# Patient Record
Sex: Female | Born: 1935 | Race: White | Hispanic: No | Smoking: Never smoker
Health system: Southern US, Community
[De-identification: ages and names within clinical notes are randomized; demographics above are authoritative.]

## PROBLEM LIST (undated history)

## (undated) ENCOUNTER — Ambulatory Visit: Admission: EM | Discharge status: Absent without leave | Payer: Medicare Other

## (undated) DIAGNOSIS — R490 Dysphonia: Secondary | ICD-10-CM

## (undated) DIAGNOSIS — Z9889 Other specified postprocedural states: Secondary | ICD-10-CM

## (undated) DIAGNOSIS — H9190 Unspecified hearing loss, unspecified ear: Secondary | ICD-10-CM

## (undated) DIAGNOSIS — I493 Ventricular premature depolarization: Secondary | ICD-10-CM

## (undated) DIAGNOSIS — K5792 Diverticulitis of intestine, part unspecified, without perforation or abscess without bleeding: Secondary | ICD-10-CM

## (undated) DIAGNOSIS — R112 Nausea with vomiting, unspecified: Secondary | ICD-10-CM

## (undated) DIAGNOSIS — I272 Pulmonary hypertension, unspecified: Secondary | ICD-10-CM

## (undated) DIAGNOSIS — G459 Transient cerebral ischemic attack, unspecified: Secondary | ICD-10-CM

## (undated) DIAGNOSIS — I10 Essential (primary) hypertension: Secondary | ICD-10-CM

## (undated) DIAGNOSIS — K219 Gastro-esophageal reflux disease without esophagitis: Secondary | ICD-10-CM

## (undated) DIAGNOSIS — K635 Polyp of colon: Secondary | ICD-10-CM

## (undated) DIAGNOSIS — C4490 Unspecified malignant neoplasm of skin, unspecified: Secondary | ICD-10-CM

## (undated) DIAGNOSIS — R7303 Prediabetes: Secondary | ICD-10-CM

## (undated) DIAGNOSIS — I499 Cardiac arrhythmia, unspecified: Secondary | ICD-10-CM

## (undated) DIAGNOSIS — I8393 Asymptomatic varicose veins of bilateral lower extremities: Secondary | ICD-10-CM

## (undated) DIAGNOSIS — N200 Calculus of kidney: Secondary | ICD-10-CM

## (undated) DIAGNOSIS — N390 Urinary tract infection, site not specified: Secondary | ICD-10-CM

## (undated) DIAGNOSIS — F419 Anxiety disorder, unspecified: Secondary | ICD-10-CM

## (undated) DIAGNOSIS — E119 Type 2 diabetes mellitus without complications: Secondary | ICD-10-CM

## (undated) DIAGNOSIS — E215 Disorder of parathyroid gland, unspecified: Secondary | ICD-10-CM

## (undated) DIAGNOSIS — E785 Hyperlipidemia, unspecified: Secondary | ICD-10-CM

## (undated) DIAGNOSIS — T753XXA Motion sickness, initial encounter: Secondary | ICD-10-CM

## (undated) DIAGNOSIS — I509 Heart failure, unspecified: Secondary | ICD-10-CM

## (undated) DIAGNOSIS — I639 Cerebral infarction, unspecified: Secondary | ICD-10-CM

## (undated) DIAGNOSIS — Z974 Presence of external hearing-aid: Secondary | ICD-10-CM

## (undated) HISTORY — DX: Calculus of kidney: N20.0

## (undated) HISTORY — DX: Hyperlipidemia, unspecified: E78.5

## (undated) HISTORY — DX: Disorder of parathyroid gland, unspecified: E21.5

## (undated) HISTORY — DX: Polyp of colon: K63.5

## (undated) HISTORY — PX: APPENDECTOMY: SHX54

## (undated) HISTORY — PX: ABDOMINAL HYSTERECTOMY: SHX81

## (undated) HISTORY — DX: Anxiety disorder, unspecified: F41.9

## (undated) HISTORY — DX: Unspecified hearing loss, unspecified ear: H91.90

## (undated) HISTORY — DX: Pulmonary hypertension, unspecified: I27.20

## (undated) HISTORY — DX: Cardiac arrhythmia, unspecified: I49.9

## (undated) HISTORY — DX: Unspecified malignant neoplasm of skin, unspecified: C44.90

## (undated) HISTORY — DX: Diverticulitis of intestine, part unspecified, without perforation or abscess without bleeding: K57.92

## (undated) HISTORY — PX: LITHOTRIPSY: SUR834

## (undated) HISTORY — DX: Urinary tract infection, site not specified: N39.0

## (undated) HISTORY — DX: Ventricular premature depolarization: I49.3

## (undated) HISTORY — PX: VEIN LIGATION AND STRIPPING: SHX2653

---
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
## 1982-06-19 HISTORY — PX: TOTAL ABDOMINAL HYSTERECTOMY W/ BILATERAL SALPINGOOPHORECTOMY: SHX83

## 1999-09-13 ENCOUNTER — Encounter: Payer: Self-pay | Admitting: Family Medicine

## 1999-09-13 ENCOUNTER — Encounter: Admission: RE | Admit: 1999-09-13 | Discharge: 1999-09-13 | Payer: Self-pay | Admitting: Family Medicine

## 2000-06-19 HISTORY — PX: ROTATOR CUFF REPAIR: SHX139

## 2000-06-19 HISTORY — PX: CHOLECYSTECTOMY: SHX55

## 2002-06-19 HISTORY — PX: PARATHYROIDECTOMY: SHX19

## 2003-12-04 ENCOUNTER — Other Ambulatory Visit: Payer: Self-pay

## 2004-05-27 ENCOUNTER — Ambulatory Visit: Payer: Self-pay | Admitting: Unknown Physician Specialty

## 2005-05-03 ENCOUNTER — Encounter: Payer: Self-pay | Admitting: Cardiovascular Disease

## 2005-06-05 ENCOUNTER — Ambulatory Visit: Payer: Self-pay | Admitting: Family Medicine

## 2006-02-20 ENCOUNTER — Inpatient Hospital Stay (HOSPITAL_COMMUNITY): Admission: AD | Admit: 2006-02-20 | Discharge: 2006-02-22 | Payer: Self-pay | Admitting: *Deleted

## 2006-02-21 ENCOUNTER — Encounter: Payer: Self-pay | Admitting: Cardiovascular Disease

## 2006-06-07 ENCOUNTER — Ambulatory Visit: Payer: Self-pay | Admitting: Internal Medicine

## 2007-06-11 ENCOUNTER — Ambulatory Visit: Payer: Self-pay | Admitting: Internal Medicine

## 2008-06-16 ENCOUNTER — Ambulatory Visit: Payer: Self-pay | Admitting: Internal Medicine

## 2008-09-04 ENCOUNTER — Encounter: Payer: Self-pay | Admitting: Cardiovascular Disease

## 2009-06-17 ENCOUNTER — Ambulatory Visit: Payer: Self-pay | Admitting: Internal Medicine

## 2009-07-26 ENCOUNTER — Encounter: Payer: Self-pay | Admitting: Cardiovascular Disease

## 2009-08-12 ENCOUNTER — Ambulatory Visit: Payer: Self-pay | Admitting: Cardiovascular Disease

## 2009-08-12 LAB — CONVERTED CEMR LAB: POC INR: 2.6

## 2009-08-13 DIAGNOSIS — I4891 Unspecified atrial fibrillation: Secondary | ICD-10-CM | POA: Insufficient documentation

## 2009-08-13 DIAGNOSIS — I4949 Other premature depolarization: Secondary | ICD-10-CM

## 2009-08-31 ENCOUNTER — Ambulatory Visit: Payer: Self-pay | Admitting: Cardiovascular Disease

## 2009-08-31 ENCOUNTER — Encounter: Payer: Self-pay | Admitting: Cardiovascular Disease

## 2009-08-31 LAB — CONVERTED CEMR LAB: POC INR: 2.5

## 2009-09-07 ENCOUNTER — Encounter (INDEPENDENT_AMBULATORY_CARE_PROVIDER_SITE_OTHER): Payer: Self-pay | Admitting: *Deleted

## 2009-09-07 LAB — CONVERTED CEMR LAB
ALT: 19 units/L
AST: 20 units/L
Albumin: 4.1 g/dL
Alkaline Phosphatase: 47 units/L
Basophils Relative: 0.05 %
Bilirubin, Direct: 0.2 mg/dL
Cholesterol: 159 mg/dL
Direct LDL: 67.7 mg/dL
Eosinophils Relative: 0.1 %
HCT: 44.6 %
Hemoglobin: 15.1 g/dL
Hgb A1c MFr Bld: 6.1 %
LDL Cholesterol: 74.3 mg/dL
Lymphocytes, automated: 1.53 %
MCV: 90.3 fL
Monocytes Relative: 0.36 %
Neutrophils Relative %: 1.71 %
Platelets: 172 10*3/uL
RBC: 4.94 M/uL
RDW: 14.1 %
Total Bilirubin: 0.9 mg/dL
Total Protein: 6.1 g/dL
Triglyceride fasting, serum: 85 mg/dL
WBC: 3.8 10*3/uL

## 2009-09-10 ENCOUNTER — Telehealth: Payer: Self-pay | Admitting: Cardiovascular Disease

## 2009-09-21 ENCOUNTER — Encounter: Payer: Self-pay | Admitting: Cardiovascular Disease

## 2009-10-01 ENCOUNTER — Encounter: Payer: Self-pay | Admitting: Cardiovascular Disease

## 2009-10-05 ENCOUNTER — Ambulatory Visit: Payer: Self-pay | Admitting: Internal Medicine

## 2009-10-05 LAB — CONVERTED CEMR LAB: POC INR: 2.1

## 2009-10-22 ENCOUNTER — Encounter (INDEPENDENT_AMBULATORY_CARE_PROVIDER_SITE_OTHER): Payer: Self-pay | Admitting: *Deleted

## 2009-11-02 ENCOUNTER — Ambulatory Visit: Payer: Self-pay | Admitting: Cardiovascular Disease

## 2009-11-02 LAB — CONVERTED CEMR LAB: POC INR: 2.4

## 2009-11-30 ENCOUNTER — Ambulatory Visit: Payer: Self-pay | Admitting: Cardiovascular Disease

## 2009-12-28 ENCOUNTER — Ambulatory Visit: Payer: Self-pay | Admitting: Cardiovascular Disease

## 2009-12-29 ENCOUNTER — Encounter: Payer: Self-pay | Admitting: Cardiovascular Disease

## 2010-01-11 ENCOUNTER — Encounter: Payer: Self-pay | Admitting: Cardiovascular Disease

## 2010-01-25 ENCOUNTER — Ambulatory Visit: Payer: Self-pay | Admitting: Cardiovascular Disease

## 2010-01-25 LAB — CONVERTED CEMR LAB: POC INR: 3

## 2010-02-17 ENCOUNTER — Telehealth: Payer: Self-pay | Admitting: Internal Medicine

## 2010-02-23 ENCOUNTER — Ambulatory Visit: Payer: Self-pay | Admitting: Cardiovascular Disease

## 2010-02-23 LAB — CONVERTED CEMR LAB: POC INR: 3

## 2010-03-23 ENCOUNTER — Ambulatory Visit: Payer: Self-pay | Admitting: Cardiovascular Disease

## 2010-04-13 ENCOUNTER — Ambulatory Visit: Payer: Self-pay | Admitting: Cardiovascular Disease

## 2010-05-11 ENCOUNTER — Ambulatory Visit: Payer: Self-pay | Admitting: Cardiovascular Disease

## 2010-05-26 ENCOUNTER — Encounter: Payer: Self-pay | Admitting: Cardiovascular Disease

## 2010-06-03 ENCOUNTER — Encounter: Payer: Self-pay | Admitting: Cardiovascular Disease

## 2010-06-03 ENCOUNTER — Ambulatory Visit: Payer: Self-pay | Admitting: Cardiovascular Disease

## 2010-06-03 DIAGNOSIS — E785 Hyperlipidemia, unspecified: Secondary | ICD-10-CM | POA: Insufficient documentation

## 2010-06-08 ENCOUNTER — Ambulatory Visit: Payer: Self-pay

## 2010-06-22 ENCOUNTER — Ambulatory Visit: Payer: Self-pay | Admitting: Internal Medicine

## 2010-06-27 ENCOUNTER — Encounter: Payer: Self-pay | Admitting: Cardiovascular Disease

## 2010-06-27 ENCOUNTER — Telehealth: Payer: Self-pay | Admitting: Cardiovascular Disease

## 2010-06-27 ENCOUNTER — Ambulatory Visit
Admission: RE | Admit: 2010-06-27 | Discharge: 2010-06-27 | Payer: Self-pay | Source: Home / Self Care | Attending: Cardiovascular Disease | Admitting: Cardiovascular Disease

## 2010-07-04 ENCOUNTER — Ambulatory Visit
Admission: RE | Admit: 2010-07-04 | Discharge: 2010-07-04 | Payer: Self-pay | Source: Home / Self Care | Attending: Cardiovascular Disease | Admitting: Cardiovascular Disease

## 2010-07-04 LAB — CONVERTED CEMR LAB: POC INR: 2

## 2010-07-13 ENCOUNTER — Ambulatory Visit: Admission: RE | Admit: 2010-07-13 | Discharge: 2010-07-13 | Payer: Self-pay | Source: Home / Self Care

## 2010-07-13 LAB — CONVERTED CEMR LAB: POC INR: 2.8

## 2010-07-17 LAB — CONVERTED CEMR LAB: POC INR: 2.2

## 2010-07-19 NOTE — Assessment & Plan Note (Signed)
Summary: NP6/AMD   Visit Type:  New patient Referring Provider:  Mariah Milling Primary Provider:  Einar Crow, MD  CC:  anxiety attacks in closed in places noticed since started metoprolol! no edema or chest pains...  History of Present Illness: Linda [name] is a 77 year old woman with past medical history of paroxysmal atrial fibrillation, on warfarin, normal coronary arteries in September 2007, echocardiogram in 2006 showing moderate asymmetric LVH with no outflow tract obstruction with normal systolic function, occasional breakthrough itch or fibrillation who presents for routine followup. She was last seen by myself at [facility] July 02, 2009.  Overall Linda [name] states that she is doing well. She has good energy, goes to the gym on a periodic basis. She has handled the lower dose diltiazem 180 mg daily and metoprolol 25 mg b.i.d. with no problems. She does feel a little bit short of breath at times and wonders if it could be to medicines. She denies any palpitations to suggest itch or fibrillation. No chest pain.  Preventive Screening-Counseling & Management  Alcohol-Tobacco     Alcohol drinks/day: 0     Smoking Status: never  Caffeine-Diet-Exercise     Caffeine use/day: no     Does Patient Exercise: yes  Current Problems (verified): 1)  Premature Ventricular Contractions  (ICD-427.69) 2)  Atrial Fibrillation  (ICD-427.31)  Current Medications (verified): 1)  Coumadin 5 Mg Tabs (Warfarin Sodium) .... As Directed By Coumadin Clinic 2)  Diltiazem Hcl Er Beads 180 Mg Xr24h-Cap (Diltiazem Hcl Er Beads) .... Take One Capsule By Mouth Daily 3)  Simvastatin 20 Mg Tabs (Simvastatin) .... Take One Tablet By Mouth Daily At Bedtime 4)  Vitamin D 400 Unit  Tabs (Cholecalciferol) .... Once Daily 5)  Metoprolol Tartrate 25 Mg Tabs (Metoprolol Tartrate) .... Take One Tablet By Mouth Twice A Day and As Needed  Allergies (verified): 1)  ! Asa 2)  ! Pcn 3)  !  Sulfa  Past History:  Past Surgical History: Abdominal Hysterectomy-Total Tonsillectomy Gall bladder removed Parathyroid removed Veins strep in the 80's  Family History: Mother: deceased 30: stroke Father: deceased 68; pneumona Brother deceased due to accident 2 brothers: living 40: heart problems                   living 43: healthy  Social History: Alcohol drinks/day:  0 Caffeine use/day:  no  Review of Systems  The patient denies fever, weight loss, weight gain, vision loss, decreased hearing, hoarseness, chest pain, syncope, dyspnea on exertion, peripheral edema, prolonged cough, abdominal pain, incontinence, muscle weakness, depression, and enlarged lymph nodes.    Vital Signs:  Patient profile:   77 year old female Height:      63.5 inches Weight:      175.50 pounds BMI:     30.71 Pulse rate:   47 / minute Pulse rhythm:   regular BP sitting:   124 / 86  (left arm) Cuff size:   regular  Vitals Entered By: Mercer Pod (August 31, 2009 10:12 AM)  Physical Exam  General:  well-appearing middle-aged woman in no apparent distress, H. EENT exam is benign, neck is supple with no JVP or carotid bruits, heart sounds regular with S1-S2 no murmurs appreciated, lungs are clear to auscultation with no wheezes or rales, abdominal exam is benign, no significant lower extremity edema, pulses are equal and symmetrical in her upper and lower extremities, neurologic exam is grossly nonfocal, skin is warm and dry.    EKG  Procedure date:  08/31/2009  Findings:      Sinus bradycardia with rate 47 beats per minute, no significant ST or T wave changes.  Impression & Recommendations:  Problem # 1:  ATRIAL FIBRILLATION (ICD-427.31) she has been maintaining sinus rhythm with no symptoms of palpitations to suggest H. with fibrillation. She is bradycardic on her current medication regimen. we have suggested that she decrease her metoprolol to 12.5 b.i.d. down from 25 mg daily.  Have asked her to monitor her heart rate with a goal heart rate in the high 50s to low 60s.She can't take any extra half dose metoprolol for any breakthrough palpitations concerning for atrial fibrillation.  Her updated medication list for this problem includes:    Coumadin 5 Mg Tabs (Warfarin sodium) .Marland Kitchen... As directed by coumadin clinic    Metoprolol Tartrate 25 Mg Tabs (Metoprolol tartrate) .Marland Kitchen... Take 1/2 tablet by mouth twice a day and as needed  Problem # 2:  PREVENTIVE HEALTH CARE (ICD-V70.0) she is currently not taking an aspirin as she is on Coumadin. We'll try to obtain her most recent cholesterol panel from her primary care physician as she is on simvastatin 20 mg daily. She has no known coronary artery disease and is a nonsmoker.  Patient Instructions: 1)  Your physician recommends that you schedule a follow-up appointment in: 6 months 2)  Your physician has recommended you make the following change in your medication: decrease metoprolol to12.5 mg twice a day  Appended Document: NP6/AMD    Clinical Lists Changes  Observations: Added new observation of TSH: 1.780 microintl units/mL (07/02/2009 11:46) Added new observation of GFRAA: 106 mL/min (07/02/2009 11:46) Added new observation of GFR: 87 mL/min (07/02/2009 11:46) Added new observation of ALBUMIN: 4.1 g/dL (09/32/3557 32:20) Added new observation of PROTEIN, TOT: 6.2 g/dL (25/42/7062 37:62) Added new observation of CALCIUM: 9.4 mg/dL (83/15/1761 60:73) Added new observation of ALK PHOS: 58 units/L (07/02/2009 11:46) Added new observation of BILI TOTAL: 0.7 mg/dL (71/11/2692 85:46) Added new observation of SGPT (ALT): 13 units/L (07/02/2009 11:46) Added new observation of SGOT (AST): 22 units/L (07/02/2009 11:46) Added new observation of CO2 PLSM/SER: 30 meq/L (07/02/2009 11:46) Added new observation of CL SERUM: 105 meq/L (07/02/2009 11:46) Added new observation of K SERUM: 4.1 meq/L (07/02/2009 11:46) Added new  observation of NA: 141 meq/L (07/02/2009 11:46) Added new observation of CREATININE: 0.7 mg/dL (27/08/5007 38:18) Added new observation of BUN: 14 mg/dL (29/93/7169 67:89) Added new observation of BG RANDOM: 77 mg/dL (38/03/1750 02:58)       -  Date:  07/02/2009    BG Random: 77    BUN: 14    Creatinine: 0.7    Sodium: 141    Potassium: 4.1    Chloride: 105    CO2 Total: 30    SGOT (AST): 22    SGPT (ALT): 13    T. Bilirubin: 0.7    Alk Phos: 58    Calcium: 9.4    Total Protein: 6.2    Albumin: 4.1    GFR(Non African American): 87    GFR(African American) 106    TSH: 1.780

## 2010-07-19 NOTE — Medication Information (Signed)
Summary: CCR/AMD  Anticoagulant Therapy  Managed by: Cloyde Reams, RN, BSN Referring MD: Julien Nordmann MD PCP: Einar Crow, MD Supervising MD: Julien Nordmann MD Indication 1: Atrial Fibrillation Lab Used: [facility] [place] Site: [place] INR POC 3.0 INR RANGE 2.0-3.0  Dietary changes: no    Health status changes: no    Bleeding/hemorrhagic complications: no    Recent/future hospitalizations: no    Any changes in medication regimen? no    Recent/future dental: no  Any missed doses?: no       Is patient compliant with meds? yes       Allergies: 1)  ! Asa 2)  ! Pcn 3)  ! Sulfa  Anticoagulation Management History:      The patient is taking warfarin and comes in today for a routine follow up visit.  Positive risk factors for bleeding include an age of 18 years or older.  The bleeding index is 'intermediate risk'.  Negative CHADS2 values include Age > 76 years old.  Anticoagulation responsible provider: Julien Nordmann MD.  INR POC: 3.0.  Cuvette Lot#: [id number].  Exp: 03/2011.    Anticoagulation Management Assessment/Plan:      The patient's current anticoagulation dose is Coumadin 5 mg tabs: (5.5mg ) as directed by coumadin clinic, Coumadin 1 mg tabs: take as directed by the coumadin clinic.  The target INR is 2.0-3.0.  The next INR is due 03/23/2010.  Results were reviewed/authorized by Cloyde Reams, RN, BSN.  She was notified by Cloyde Reams RN.         Prior Anticoagulation Instructions: INR 3.0  Continue taking 5.5 mg daily. Recheck in 4 weeks.    Current Anticoagulation Instructions: INR 3.0  Continue on same dosage 5.5mg  daily Recheck in 4 weeks.

## 2010-07-19 NOTE — Miscellaneous (Signed)
Summary: Rx refill Coumadin 1mg   Clinical Lists Changes  Medications: Added new medication of COUMADIN 1 MG TABS (WARFARIN SODIUM) take as directed by the coumadin clinic - Signed Rx of COUMADIN 1 MG TABS (WARFARIN SODIUM) take as directed by the coumadin clinic;  #90 x 1;  Signed;  Entered by: Bishop Dublin, CMA;  Authorized by: Dossie Arbour MD;  Method used: Faxed to [facility], [address] [place], [place]  [address], Ph: [phone], Fax: [phone]    Prescriptions: COUMADIN 1 MG TABS (WARFARIN SODIUM) take as directed by the coumadin clinic  #90 x 1   Entered by:   Bishop Dublin, CMA   Authorized by:   Dossie Arbour MD   Signed by:   Bishop Dublin, CMA on 12/29/2009   Method used:   Faxed to ...       [facility] (mail-order)       [address] [place], [place]  [address]       Ph: [phone]       Fax: [phone]   RxID:   [id number]

## 2010-07-19 NOTE — Medication Information (Signed)
Summary: Coumadin Clinic  Anticoagulant Therapy  Managed by: Cloyde Reams, RN, BSN Referring MD: Julien Nordmann MD PCP: Einar Crow, MD Supervising MD: Julien Nordmann MD Indication 1: Atrial Fibrillation Lab Used: [facility] [place] Site: [place] INR POC 2.2 INR RANGE 2.0-3.0    Bleeding/hemorrhagic complications: no     Any changes in medication regimen? no     Any missed doses?: no       Is patient compliant with meds? yes       Allergies: 1)  ! Asa 2)  ! Pcn 3)  ! Sulfa  Anticoagulation Management History:      The patient is taking warfarin and comes in today for a routine follow up visit.  Positive risk factors for bleeding include an age of 75 years or older.  The bleeding index is 'intermediate risk'.  Negative CHADS2 values include Age > 75 years old.  Anticoagulation responsible provider: Julien Nordmann MD.  INR POC: 2.2.  Cuvette Lot#: [id number].  Exp: 01/2011.    Anticoagulation Management Assessment/Plan:      The patient's current anticoagulation dose is Coumadin 5 mg tabs: (5.5mg ) as directed by coumadin clinic.  The target INR is 2.0-3.0.  The next INR is due 12/28/2009.  Results were reviewed/authorized by Cloyde Reams, RN, BSN.  She was notified by Cloyde Reams RN.         Prior Anticoagulation Instructions: INR 2.4  Continue on same dosage 5.5mg  daily.  Recheck in 4 weeks.    Current Anticoagulation Instructions: INR 2.2  Continue on same dosage 5.5mg  daily.  Recheck in 4 weeks.

## 2010-07-19 NOTE — Medication Information (Signed)
Summary: rov/ewj  Anticoagulant Therapy  Managed by: Cloyde Reams, RN, BSN Referring MD: Julien Nordmann MD PCP: Einar Crow, MD Supervising MD: Julien Nordmann MD Indication 1: Atrial Fibrillation Lab Used: [facility] [place] Site: [place] INR POC 1.8 INR RANGE 2.0-3.0  Dietary changes: yes       Details: May have eaten less salads.   Health status changes: no    Bleeding/hemorrhagic complications: no    Recent/future hospitalizations: no    Any changes in medication regimen? no    Recent/future dental: no  Any missed doses?: no       Is patient compliant with meds? yes       Allergies: 1)  ! Asa 2)  ! Pcn 3)  ! Sulfa  Anticoagulation Management History:      The patient is taking warfarin and comes in today for a routine follow up visit.  Positive risk factors for bleeding include an age of 75 years or older.  The bleeding index is 'intermediate risk'.  Negative CHADS2 values include Age > 52 years old.  Anticoagulation responsible provider: Julien Nordmann MD.  INR POC: 1.8.  Cuvette Lot#: [id number].  Exp: 04/2011.    Anticoagulation Management Assessment/Plan:      The patient's current anticoagulation dose is Coumadin 5 mg tabs: (5.5mg ) as directed by coumadin clinic, Coumadin 1 mg tabs: take as directed by the coumadin clinic.  The target INR is 2.0-3.0.  The next INR is due 04/13/2010.  Results were reviewed/authorized by Cloyde Reams, RN, BSN.  She was notified by Cloyde Reams RN.         Prior Anticoagulation Instructions: INR 3.0  Continue on same dosage 5.5mg  daily Recheck in 4 weeks.    Current Anticoagulation Instructions: INR 1.8  Take 7.5mg  today, then resume same dosage 5.5mg  daily.  Recheck in 3 weeks.

## 2010-07-19 NOTE — Progress Notes (Signed)
Summary: Tagamet interaction with coumadin    Caller: Patient Details for Reason: COUMADIN INTERACTION Summary of Call: Was told could use Tagamet but on Coumadin and on label there is an interaction with coumadin.  Is it okay to take? Initial call taken by: Bishop Dublin, CMA,  February 17, 2010 1:46 PM    Additional Follow-up for Phone Call Additional follow up Details #2::    porbably ok. can check with pharmacist. Jasmine December will f/u/.  Dolores Patty, MD, [facility]  February 18, 2010 5:19 PM/  Additional Follow-up for Phone Call Additional follow up Details #3:: Details for Additional Follow-up Action Taken: spoke with patient and told probably okay with Dr.Bensimhon to take and to also check with pharmacist. Additional Follow-up by: Bishop Dublin, CMA,  February 22, 2010 9:24 AM

## 2010-07-19 NOTE — Progress Notes (Signed)
Summary: PHI  PHI   Imported By: Harlon Flor 09/01/2009 08:49:06  _____________________________________________________________________  External Attachment:    Type:   Image     Comment:   External Document

## 2010-07-19 NOTE — Miscellaneous (Signed)
Summary: coumadin refill  Clinical Lists Changes  Medications: Added new medication of COUMADIN 5 MG TABS (WARFARIN SODIUM) as directed by coumadin clinic [BMN] - Signed Rx of COUMADIN 5 MG TABS (WARFARIN SODIUM) as directed by coumadin clinic;  #90 x 3 Brand medically necessary;  Signed;  Entered by: Charlena Cross, RN, BSN;  Authorized by: Dossie Arbour MD;  Method used: Printed then faxed to    Prescriptions: COUMADIN 5 MG TABS (WARFARIN SODIUM) as directed by coumadin clinic Brand medically necessary #90 x 3   Entered by:   Charlena Cross, RN, BSN   Authorized by:   Dossie Arbour MD   Signed by:   Charlena Cross, RN, BSN on 07/26/2009   Method used:   Printed then faxed to ...         RxID:   [id number]   Appended Document: coumadin refill Prescriptions: COUMADIN 5 MG TABS (WARFARIN SODIUM) as directed by coumadin clinic Brand medically necessary #90 x 3   Entered by:   Charlena Cross, RN, BSN   Authorized by:   Dossie Arbour MD   Signed by:   Charlena Cross, RN, BSN on 07/26/2009   Method used:   Print then Give to Patient   RxID:   [id number]

## 2010-07-19 NOTE — Cardiovascular Report (Signed)
Summary: Cardiac Cath Other  Cardiac Cath Other   Imported By: Harlon Flor 09/01/2009 08:38:34  _____________________________________________________________________  External Attachment:    Type:   Image     Comment:   External Document

## 2010-07-19 NOTE — Medication Information (Signed)
Summary: CCR/AMD  Anticoagulant Therapy  Managed by: Charlena Cross, RN, BSN Referring MD: Julien Nordmann MD PCP: Einar Crow, MD Supervising MD: Julien Nordmann MD Indication 1: Atrial Fibrillation Lab Used: [facility] [place] Site: [place] INR POC 2.1 INR RANGE 2.0-3.0  Dietary changes: no    Health status changes: no    Bleeding/hemorrhagic complications: no    Recent/future hospitalizations: no    Any changes in medication regimen? no    Recent/future dental: no  Any missed doses?: no       Is patient compliant with meds? yes       Allergies: 1)  ! Asa 2)  ! Pcn 3)  ! Sulfa  Anticoagulation Management History:      The patient is taking warfarin and comes in today for a routine follow up visit.  Positive risk factors for bleeding include an age of 75 years or older.  The bleeding index is 'intermediate risk'.  Negative CHADS2 values include Age > 36 years old.  Anticoagulation responsible provider: Julien Nordmann MD.  INR POC: 2.1.    Anticoagulation Management Assessment/Plan:      The patient's current anticoagulation dose is Coumadin 5 mg tabs: as directed by coumadin clinic.  The next INR is due 11/02/2009.  Results were reviewed/authorized by Charlena Cross, RN, BSN.  She was notified by Charlena Cross, RN, BSN.         Prior Anticoagulation Instructions: The patient is to continue with the same dose of coumadin.  This dosage includes: coumadin 5.5 mg daily  Current Anticoagulation Instructions: Same as Prior Instructions.

## 2010-07-19 NOTE — Medication Information (Signed)
Summary: CCR  Anticoagulant Therapy  Managed by: Charlena Cross, RN, BSN Referring MD: Julien Nordmann MD PCP: Einar Crow, MD Supervising MD: Julien Nordmann MD Indication 1: Atrial Fibrillation Lab Used: [facility] [place] Site: [place] INR POC 2.5 INR RANGE 2.0-3.0  Dietary changes: no    Health status changes: no    Bleeding/hemorrhagic complications: no    Recent/future hospitalizations: no    Any changes in medication regimen? no    Recent/future dental: no  Any missed doses?: no       Is patient compliant with meds? yes       Allergies: 1)  ! Asa 2)  ! Pcn 3)  ! Sulfa  Anticoagulation Management History:      The patient is taking warfarin and comes in today for a routine follow up visit.  Positive risk factors for bleeding include an age of 75 years or older.  The bleeding index is 'intermediate risk'.  Negative CHADS2 values include Age > 24 years old.  Anticoagulation responsible provider: Julien Nordmann MD.  INR POC: 2.5.    Anticoagulation Management Assessment/Plan:      The patient's current anticoagulation dose is Coumadin 5 mg tabs: as directed by coumadin clinic.  The next INR is due 09/28/2009.  Results were reviewed/authorized by Charlena Cross, RN, BSN.  She was notified by Charlena Cross, RN, BSN.         Prior Anticoagulation Instructions: The patient is to continue with the same dose of coumadin.  This dosage includes: 5.5mg  everyday  Current Anticoagulation Instructions: The patient is to continue with the same dose of coumadin.  This dosage includes: coumadin 5.5 mg daily

## 2010-07-19 NOTE — Medication Information (Signed)
Summary: CCR/GLC  Anticoagulant Therapy  Managed by: Cloyde Reams, RN, BSN Referring MD: Julien Nordmann MD PCP: Einar Crow, MD Supervising MD: Julien Nordmann MD Indication 1: Atrial Fibrillation Lab Used: [facility] [place] Site: [place] INR RANGE 2.0-3.0  Dietary changes: no     Bleeding/hemorrhagic complications: yes       Details: nose bleed small amount did not have trouble stopping    Any changes in medication regimen? no     Any missed doses?: yes     Details: did not take 1/2 1 week ago  Is patient compliant with meds? yes       Allergies: 1)  ! Asa 2)  ! Pcn 3)  ! Sulfa  Anticoagulation Management History:      The patient is taking warfarin and comes in today for a routine follow up visit.  Positive risk factors for bleeding include an age of 75 years or older.  The bleeding index is 'intermediate risk'.  Negative CHADS2 values include Age > 33 years old.  Anticoagulation responsible provider: Julien Nordmann MD.  Cuvette Lot#: [id number].  Exp: 02/2011.    Anticoagulation Management Assessment/Plan:      The patient's current anticoagulation dose is Coumadin 5 mg tabs: (5.5mg ) as directed by coumadin clinic.  The target INR is 2.0-3.0.  The next INR is due 01/26/2010.  Results were reviewed/authorized by Cloyde Reams, RN, BSN.  She was notified by Benedict Needy, RN.         Prior Anticoagulation Instructions: INR 2.2  Continue on same dosage 5.5mg  daily.  Recheck in 4 weeks.    Current Anticoagulation Instructions: INR 2.2  Continue taking 5.5 mg daily Recheck in 4 weeks.

## 2010-07-19 NOTE — Medication Information (Signed)
Summary: CCR/NE  Anticoagulant Therapy  Managed by: Cloyde Reams, RN, BSN Referring MD: Julien Nordmann MD PCP: Einar Crow, MD Supervising MD: Julien Nordmann MD Indication 1: Atrial Fibrillation Lab Used: [facility] [place] Site: [place] INR POC 3.0 INR RANGE 2.0-3.0  Dietary changes: no    Health status changes: no    Bleeding/hemorrhagic complications: no    Recent/future hospitalizations: no    Any changes in medication regimen? no    Recent/future dental: no  Any missed doses?: no       Is patient compliant with meds? yes       Allergies: 1)  ! Asa 2)  ! Pcn 3)  ! Sulfa  Anticoagulation Management History:      The patient is taking warfarin and comes in today for a routine follow up visit.  Positive risk factors for bleeding include an age of 75 years or older.  The bleeding index is 'intermediate risk'.  Negative CHADS2 values include Age > 25 years old.  Anticoagulation responsible Ferdie Bakken: Julien Nordmann MD.  INR POC: 3.0.  Exp: 02/2011.    Anticoagulation Management Assessment/Plan:      The patient's current anticoagulation dose is Coumadin 5 mg tabs: (5.5mg ) as directed by coumadin clinic, Coumadin 1 mg tabs: take as directed by the coumadin clinic.  The target INR is 2.0-3.0.  The next INR is due 02/23/2010.  Results were reviewed/authorized by Cloyde Reams, RN, BSN.  She was notified by Hardin Negus, RMA.         Prior Anticoagulation Instructions: INR 2.2  Continue taking 5.5 mg daily Recheck in 4 weeks.   Current Anticoagulation Instructions: INR 3.0  Continue taking 5.5 mg daily. Recheck in 4 weeks.

## 2010-07-19 NOTE — Medication Information (Signed)
Summary: rov/ewj  Anticoagulant Therapy  Managed by: Cloyde Reams, RN, BSN Referring MD: Julien Nordmann MD PCP: Einar Crow, MD Supervising MD: Julien Nordmann MD Indication 1: Atrial Fibrillation Lab Used: [facility] [place] Site: [place] INR POC 2.4 INR RANGE 2.0-3.0  Dietary changes: no    Health status changes: no    Bleeding/hemorrhagic complications: no    Recent/future hospitalizations: no    Any changes in medication regimen? no    Recent/future dental: no  Any missed doses?: no         Allergies: 1)  ! Asa 2)  ! Pcn 3)  ! Sulfa  Anticoagulation Management History:      The patient is taking warfarin and comes in today for a routine follow up visit.  Positive risk factors for bleeding include an age of 50 years or older.  The bleeding index is 'intermediate risk'.  Negative CHADS2 values include Age > 69 years old.  Anticoagulation responsible provider: Julien Nordmann MD.  INR POC: 2.4.  Exp: 04/2011.    Anticoagulation Management Assessment/Plan:      The patient's current anticoagulation dose is Coumadin 5 mg tabs: (5.5mg ) as directed by coumadin clinic, Coumadin 1 mg tabs: take as directed by the coumadin clinic.  The target INR is 2.0-3.0.  The next INR is due 05/11/2010.  Results were reviewed/authorized by Cloyde Reams, RN, BSN.  She was notified by Cloyde Reams RN.         Prior Anticoagulation Instructions: INR 1.8  Take 7.5mg  today, then resume same dosage 5.5mg  daily.  Recheck in 3 weeks.    Current Anticoagulation Instructions: INR 2.4  Continue on same dosage 5.5mg  daily.  Recheck in 4 weeks.

## 2010-07-19 NOTE — Miscellaneous (Signed)
  Clinical Lists Changes  Observations: Added new observation of ALBUMIN: 4.1 g/dL (09/81/1914 78:29) Added new observation of PROTEIN, TOT: 6.1 g/dL (56/21/3086 57:84) Added new observation of BILI DIRECT: 0.2 mg/dL (69/62/9528 41:32) Added new observation of BILI TOTAL: 0.9 mg/dL (44/06/270 53:66) Added new observation of ALK PHOS: 47 units/L (09/07/2009 10:05) Added new observation of SGPT (ALT): 19 units/L (09/07/2009 10:05) Added new observation of SGOT (AST): 20 units/L (09/07/2009 10:05) Added new observation of LDL DIR: 67.7 mg/dL (44/08/4740 5:95) Added new observation of LDL: 74.3 mg/dL (63/87/5643 3:29) Added new observation of TRIGLYCERIDE: 85 mg/dL (51/88/4166 0:63) Added new observation of CHOLESTEROL: 159 mg/dL (01/60/1093 2:35) Added new observation of BASOPHIL %: 0.05 % (09/07/2009 9:55) Added new observation of % EOS AUTO: 0.10 % (09/07/2009 9:55) Added new observation of MONOCYTE %: 0.36 % (09/07/2009 9:55) Added new observation of LYMPH %: 1.53 % (09/07/2009 9:55) Added new observation of PMN %: 1.71 % (09/07/2009 9:55) Added new observation of RDW: 14.1 % (09/07/2009 9:55) Added new observation of MCV: 90.3 fL (09/07/2009 9:55) Added new observation of PLATELETK/UL: 172 K/uL (09/07/2009 9:55) Added new observation of RBC M/UL: 4.94 M/uL (09/07/2009 9:55) Added new observation of HCT: 44.6 % (09/07/2009 9:55) Added new observation of HGB: 15.1 g/dL (57/32/2025 4:27) Added new observation of WBC COUNT: 3.8 10*3/microliter (09/07/2009 9:55) Added new observation of HGBA1C: 6.1 % (09/07/2009 9:52)      -  Date:  09/07/2009    HgBA1c: 6.1    WBC: 3.8    HGB: 15.1    HCT: 44.6    RBC: 4.94    PLT: 172    MCV: 90.3    RDW: 14.1    Neutrophil: 1.71    Lymphs: 1.53    Monos: 0.36    Eos: 0.10    Basophil: 0.05    Total Chol: 159    TG: 85    LDL: 74.3    Direct LDL: 67.7    SGOT (AST): 20    SGPT (ALT): 19    Alk Phos: 47    Total Bili: 0.9  Direct Bili: 0.2    Total Protein: 6.1    Albumin: 4.1

## 2010-07-19 NOTE — Medication Information (Signed)
Summary: CCR/AMD  Anticoagulant Therapy  Managed by: Cloyde Reams, RN, BSN Referring MD: Julien Nordmann MD PCP: Einar Crow, MD Supervising MD: Julien Nordmann MD Indication 1: Atrial Fibrillation Lab Used: [facility] [place] Site: [place] INR POC 2.4 INR RANGE 2.0-3.0    Bleeding/hemorrhagic complications: no     Any changes in medication regimen? no     Any missed doses?: no       Is patient compliant with meds? yes       Allergies: 1)  ! Asa 2)  ! Pcn 3)  ! Sulfa  Anticoagulation Management History:      The patient is taking warfarin and comes in today for a routine follow up visit.  Positive risk factors for bleeding include an age of 18 years or older.  The bleeding index is 'intermediate risk'.  Negative CHADS2 values include Age > 70 years old.  Anticoagulation responsible provider: Julien Nordmann MD.  INR POC: 2.4.  Cuvette Lot#: [id number].  Exp: 01/2011.    Anticoagulation Management Assessment/Plan:      The patient's current anticoagulation dose is Coumadin 5 mg tabs: as directed by coumadin clinic.  The target INR is 2.0-3.0.  The next INR is due 11/30/2009.  Results were reviewed/authorized by Cloyde Reams, RN, BSN.  She was notified by Cloyde Reams RN.         Prior Anticoagulation Instructions: The patient is to continue with the same dose of coumadin.  This dosage includes: coumadin 5.5 mg daily  Current Anticoagulation Instructions: INR 2.4  Continue on same dosage 5.5mg  daily.  Recheck in 4 weeks.

## 2010-07-19 NOTE — Progress Notes (Signed)
Summary: RX  Phone Note Refill Request Call back at Home Phone [phone] Message from:  Patient on September 10, 2009 2:30 PM  [facility]IN[facility] [address]  Initial call taken by: Harlon Flor,  September 10, 2009 2:31 PM

## 2010-07-19 NOTE — Letter (Signed)
Summary: External Correspondence  External Correspondence   Imported By: Harlon Flor 10/22/2009 11:28:57  _____________________________________________________________________  External Attachment:    Type:   Image     Comment:   External Document

## 2010-07-19 NOTE — Medication Information (Signed)
Summary: rov/ewj  Anticoagulant Therapy  Managed by: Bethena Midget, RN, BSN Referring MD: Julien Nordmann MD PCP: Einar Crow, MD Supervising MD: Julien Nordmann MD Indication 1: Atrial Fibrillation Lab Used: [facility] [place] Site: [place] INR POC 2.0 INR RANGE 2.0-3.0  Dietary changes: no    Health status changes: no    Bleeding/hemorrhagic complications: no    Recent/future hospitalizations: no    Any changes in medication regimen? yes       Details: Added CQ10   Recent/future dental: no  Any missed doses?: no       Is patient compliant with meds? yes       Allergies: 1)  ! Asa 2)  ! Pcn 3)  ! Sulfa  Anticoagulation Management History:      The patient is taking warfarin and comes in today for a routine follow up visit.  Positive risk factors for bleeding include an age of 19 years or older.  The bleeding index is 'intermediate risk'.  Negative CHADS2 values include Age > 67 years old.  Anticoagulation responsible provider: Julien Nordmann MD.  INR POC: 2.0.  Cuvette Lot#: [id number].  Exp: 05/2011.    Anticoagulation Management Assessment/Plan:      The patient's current anticoagulation dose is Coumadin 5 mg tabs: (5.5mg ) as directed by coumadin clinic, Coumadin 1 mg tabs: take as directed by the coumadin clinic.  The target INR is 2.0-3.0.  The next INR is due 06/08/2010.  Anticoagulation instructions were given to patient.  Results were reviewed/authorized by Bethena Midget, RN, BSN.  She was notified by Bethena Midget, RN, BSN.         Prior Anticoagulation Instructions: INR 2.4  Continue on same dosage 5.5mg  daily.  Recheck in 4 weeks.    Current Anticoagulation Instructions: INR 2.0 Today take 6mg s then Continue 5.5mg s everyday. Recheck in 4 weeks.

## 2010-07-19 NOTE — Assessment & Plan Note (Signed)
Summary: ROV/CCR/AMD   Visit Type:  Follow-up Referring Provider:  Mariah Milling Primary Provider:  Einar Crow, MD  CC:  bruising on hands and sob & feeling fatigue.  History of Present Illness: [name] is a 77 year old woman with past medical history of paroxysmal atrial fibrillation, on warfarin, normal coronary arteries in September 2007, echocardiogram in 2006 showing moderate asymmetric LVH with no outflow tract obstruction with normal systolic function, occasional break through atrial  fibrillation who presents for routine followup.  Ms. Guitron has stopped her metoprolol. This was making her feel fatigue and she believes it was also affecting her sleep. She feels better since stopping the metoprolol. She has more energy and malaise shortness of breath. She denies any significant episodes of atrial fibrillation. No significant lower extremity edema, no chest discomfort. Overall she has no complaints. She is working out at [employer] 3 times per week with her friend,  trying to watch her weight. She is due to go on a trip for 2 weeks in the near future.  Current Medications (verified): 1)  Coumadin 5 Mg Tabs (Warfarin Sodium) .... (5.5mg ) As Directed By Coumadin Clinic 2)  Diltiazem Hcl Er Beads 180 Mg Xr24h-Cap (Diltiazem Hcl Er Beads) .... Take One Capsule By Mouth Daily 3)  Zocor 40 Mg Tabs (Simvastatin) 4)  Vitamin D 400 Unit  Tabs (Cholecalciferol) .... Once Daily 5)  Nitrostat 0.4 Mg Subl (Nitroglycerin) .Marland Kitchen.. 1 Tablet Under Tongue At Onset of Chest Pain; You May Repeat Every 5 Minutes For Up To 3 Doses. 6)  Zyrtec Allergy 10 Mg Tabs (Cetirizine Hcl) .... As Needed 7)  Calcium Carbonate 600 Mg Tabs (Calcium Carbonate) .... Two Times A Day  Allergies (verified): 1)  ! Asa 2)  ! Pcn 3)  ! Sulfa  Past History:  Past Medical History: Last updated: 08/13/2009 PREMATURE VENTRICULAR CONTRACTIONS (ICD-427.69) ATRIAL FIBRILLATION (ICD-427.31)  Past Surgical History: Last updated:  09/13/09 Abdominal Hysterectomy-Total Tonsillectomy Gall bladder removed Parathyroid removed Veins strep in the 80's  Family History: Last updated: 13-Sep-2009 Mother: deceased 23: stroke Father: deceased 75; pneumona Brother deceased due to accident 2 brothers: living 15: heart problems                   living 41: healthy  Social History: Last updated: 08/13/2009 Married -- 2 children, 5 grands, 2 great grands Tobacco Use - No.  Regular Exercise - yes  Risk Factors: Alcohol Use: 0 (13-Sep-2009) Caffeine Use: no (September 13, 2009) Exercise: yes (09/13/2009)  Risk Factors: Smoking Status: never (09-13-09)  Review of Systems  The patient denies fever, weight loss, weight gain, vision loss, decreased hearing, hoarseness, chest pain, syncope, dyspnea on exertion, peripheral edema, prolonged cough, abdominal pain, incontinence, muscle weakness, depression, and enlarged lymph nodes.    Vital Signs:  Patient profile:   77 year old female Height:      63.5 inches Weight:      178 pounds Pulse rate:   67 / minute BP sitting:   128 / 78  (right arm) Cuff size:   regular  Vitals Entered By: Bishop Dublin, CMA (November 30, 2009 2:04 PM)  Physical Exam  General:  Well developed, well nourished, in no acute distress. Head:  normocephalic and atraumatic Neck:  Neck supple, no JVD. No masses, thyromegaly or abnormal cervical nodes. Lungs:  Clear bilaterally to auscultation and percussion. Heart:  Non-displaced PMI, chest non-tender; regular rate and rhythm, S1, S2 without murmurs, rubs or gallops. Carotid upstroke normal, no bruit.  Pedals normal  pulses. No edema, no varicosities. Abdomen:  Bowel sounds positive; abdomen soft and non-tender without masses Msk:  Back normal, normal gait. Muscle strength and tone normal. Pulses:  pulses normal in all 4 extremities Extremities:  No clubbing or cyanosis. Neurologic:  Alert and oriented x 3. Skin:  Intact without lesions or  rashes. Psych:  Normal affect.   New Orders:     1)  EKG w/ Interpretation (93000)   EKG  Procedure date:  11/30/2009  Findings:      normal sinus rhythm with rate 68 beats per minute, no significant ST or T wave changes.  Impression & Recommendations:  Problem # 1:  ATRIAL FIBRILLATION (ICD-427.31) history of paroxysmal atrial fibrillation. No recent episodes that she knows about. We'll continue her on the diltiazem. She is healthy metoprolol and reports feeling better. She no longer has bradycardia. We'll continue to watch her closely.  The following medications were removed from the medication list:    Metoprolol Tartrate 25 Mg Tabs (Metoprolol tartrate) .Marland Kitchen... Take 1/2 tablet by mouth twice a day and as needed Her updated medication list for this problem includes:    Coumadin 5 Mg Tabs (Warfarin sodium) ..... (5.5mg ) as directed by coumadin clinic  Orders: EKG w/ Interpretation (93000)  Problem # 2:  PREVENTIVE HEALTH CARE (ICD-V70.0) she is exercising, try to watch her weight and watch her diet and she is doing a good job. her cholesterol seems to be well controlled on her current dose of simvastatin. She has no complaints of muscle cramping or weakness.

## 2010-07-19 NOTE — Progress Notes (Signed)
Summary: METOPROLOL  Phone Note Call from Patient Call back at Home Phone [phone]   Caller: SELF Call For: [facility] Summary of Call: PT IS HAVING TROUBLE WITH METOPROLOL-BP AS BEEN 120/75;132/81;126/80;125/80;127/76-PULSE HAS BEEN IN THE 60'S Initial call taken by: Harlon Flor,  September 10, 2009 2:33 PM  Follow-up for Phone Call        pt cut metoprolol to 12.5 mg twice daily.  Pt states that her pulse remained in the 40's on lower dose. Pt has since stopped taking metoprolol and BP has averaged 120's/70's sys with HR of 55-65.  Instructed pt to continue to hold metoprolol and to call back if she notices her BP increasing. Will call if Dr. Mariah Milling has further instructions.  Follow-up by: Charlena Cross, RN, BSN,  September 10, 2009 2:44 PM    New/Updated Medications: NITROSTAT 0.4 MG SUBL (NITROGLYCERIN) 1 tablet under tongue at onset of chest pain; you may repeat every 5 minutes for up to 3 doses. Prescriptions: NITROSTAT 0.4 MG SUBL (NITROGLYCERIN) 1 tablet under tongue at onset of chest pain; you may repeat every 5 minutes for up to 3 doses.  #25 x 3   Entered by:   Charlena Cross, RN, BSN   Authorized by:   Dossie Arbour MD   Signed by:   Charlena Cross, RN, BSN on 09/10/2009   Method used:   [facility]ically to        [facility] [phone]* (retail)       [address] [place], [place]  [address]       Ph: [phone] or [phone]       Fax: [phone]   RxID:   [id number]

## 2010-07-19 NOTE — Medication Information (Signed)
Summary: CCR/AMD  Anticoagulant Therapy  Managed by: Charlena Cross, RN, BSN Referring MD: Julien Nordmann MD PCP: Einar Crow, MD Supervising MD: Julien Nordmann MD Indication 1: Atrial Fibrillation [place] Site: [place] INR POC 2.6  Dietary changes: no    Health status changes: no    Bleeding/hemorrhagic complications: no    Recent/future hospitalizations: no    Any changes in medication regimen? no    Recent/future dental: no  Any missed doses?: no       Is patient compliant with meds? yes       Anticoagulation Management History:      The patient is taking warfarin and comes in today for a routine follow up visit.  Positive risk factors for bleeding include an age of 75 years or older.  The bleeding index is 'intermediate risk'.  Negative CHADS2 values include Age > 48 years old.  Anticoagulation responsible provider: Julien Nordmann MD.  INR POC: 2.6.    Anticoagulation Management Assessment/Plan:      The patient's current anticoagulation dose is Coumadin 5 mg tabs: as directed by coumadin clinic.  The next INR is due 09/09/2009.  Results were reviewed/authorized by Charlena Cross, RN, BSN.  She was notified by Charlena Cross, RN, BSN.         Current Anticoagulation Instructions: The patient is to continue with the same dose of coumadin.  This dosage includes: 5.5mg  everyday

## 2010-07-21 NOTE — Progress Notes (Signed)
Summary: Heart problems  Phone Note Call from Patient Call back at Home Phone [phone]   Caller: Self Call For: Auriah Hollings Summary of Call: Pt states that her heart is "ou of whack" and would like to come in this am. Initial call taken by: Harlon Flor,  June 27, 2010 9:44 AM  Follow-up for Phone Call        Pt in today for nurse visit, see visit note 06/27/10. Follow-up by: Lanny Hurst RN,  June 27, 2010 5:03 PM

## 2010-07-21 NOTE — Assessment & Plan Note (Signed)
Summary: F6M/AMD   Visit Type:  Follow-up Referring Provider:  Mariah Milling Primary Provider:  Einar Crow, MD  CC:  Denies chest pain or shortness of breath..  History of Present Illness: Ms. Kerwood is a 77 year old woman with past medical history of paroxysmal atrial fibrillation  on warfarin, normal coronary arteries in September 2007, echocardiogram in 2006 showing moderate asymmetric LVH with no outflow tract obstruction with normal systolic function, occasional break through atrial  fibrillation who presents for routine followup. previous nocturnal oximetry had showed some desaturations though she reports sleeping well and has no significant snoring or apnea.  Ms. Dobberstein reports that she feels well. She has no complaints, has good exercise tolerance. She denies any tachycardia palpitations. She does have a rare palpitation though this is usually very brief. She has good exercise and activity tolerance with no edema, no lightheadedness or shortness of breath.  Labs from this past week showed total cholesterol 160, LDL 76, HDL 70, normal LFTs, normal CBC    Current Medications (verified): 1)  Coumadin 5 Mg Tabs (Warfarin Sodium) .... (5.5mg ) As Directed By Coumadin Clinic 2)  Diltiazem Hcl Er Beads 180 Mg Xr24h-Cap (Diltiazem Hcl Er Beads) .... Take One Capsule By Mouth Daily 3)  Zocor 40 Mg Tabs (Simvastatin) 4)  Vitamin D 400 Unit  Tabs (Cholecalciferol) .... Once Daily 5)  Nitrostat 0.4 Mg Subl (Nitroglycerin) .Marland Kitchen.. 1 Tablet Under Tongue At Onset of Chest Pain; You May Repeat Every 5 Minutes For Up To 3 Doses. 6)  Zyrtec Allergy 10 Mg Tabs (Cetirizine Hcl) .... As Needed 7)  Calcium Carbonate 600 Mg Tabs (Calcium Carbonate) .... Two Times A Day 8)  Coumadin 1 Mg Tabs (Warfarin Sodium) .... Take As Directed By The [facility]madin Clinic 9)  Co-Enzyme Q-10 30 Mg Caps (Coenzyme Q10) .... One Tablet Once Daily  Allergies (verified): 1)  ! Asa 2)  ! Pcn 3)  ! Sulfa  Past History:  Past  Medical History: Last updated: 08/13/2009 PREMATURE VENTRICULAR CONTRACTIONS (ICD-427.69) ATRIAL FIBRILLATION (ICD-427.31)  Past Surgical History: Last updated: 09-30-2009 Abdominal Hysterectomy-Total Tonsillectomy Gall bladder removed Parathyroid removed Veins strep in the 80's  Family History: Last updated: 09/30/2009 Mother: deceased 5: stroke Father: deceased 16; pneumona Brother deceased due to accident 2 brothers: living 16: heart problems                   living 65: healthy  Social History: Last updated: 08/13/2009 Married -- 2 children, 5 grands, 2 great grands Tobacco Use - No.  Regular Exercise - yes  Risk Factors: Alcohol Use: 0 (Sep 30, 2009) Caffeine Use: no (2009/09/30) Exercise: yes (September 30, 2009)  Risk Factors: Smoking Status: never (09-30-2009)  Review of Systems  The patient denies fever, weight loss, weight gain, vision loss, decreased hearing, hoarseness, chest pain, syncope, dyspnea on exertion, peripheral edema, prolonged cough, abdominal pain, incontinence, muscle weakness, depression, and enlarged lymph nodes.    Vital Signs:  Patient profile:   77 year old female Height:      63.5 inches Weight:      180.50 pounds BMI:     31.59 Pulse rate:   59 / minute BP sitting:   134 / 72  (left arm) Cuff size:   regular  Vitals Entered By: Lysbeth Galas CMA (June 03, 2010 10:51 AM)  Physical Exam  General:  Well developed, well nourished, in no acute distress. Head:  normocephalic and atraumatic Neck:  Neck supple, no JVD. No masses, thyromegaly or abnormal cervical nodes. Lungs:  Clear  bilaterally to auscultation and percussion. Heart:  Non-displaced PMI, chest non-tender; regular rate and rhythm, S1, S2 with I/VI SEM RSB, rubs or gallops. Carotid upstroke normal, no bruit.  Pedals normal pulses. No edema, no varicosities. Abdomen:  Bowel sounds positive; abdomen soft and non-tender without masses Msk:  Back normal, normal gait. Muscle  strength and tone normal. Pulses:  pulses normal in all 4 extremities Extremities:  No clubbing or cyanosis. Neurologic:  Alert and oriented x 3. Skin:  Intact without lesions or rashes. Psych:  Normal affect.   Impression & Recommendations:  Problem # 1:  ATRIAL FIBRILLATION (ICD-427.31) she has maintained sinus rhythm for several years. By her symptoms, does not appear to be having any breakthrough atrial ablation. We will hold her anticoagulation at this time and closely monitor her. I suggested that she closely monitor her heart rate for irregular beats and contact us for an EKG if this persists.  The following medications were removed from the medication list:    Coumadin 1 Mg Tabs (Warfarin sodium) .Marland Kitchen... Take as directed by the coumadin clinic Her updated medication list for this problem includes:    Coumadin 5 Mg Tabs (Warfarin sodium) ..... (5.5mg ) as directed by coumadin clinic  Problem # 2:  HYPERLIPIDEMIA-MIXED (ICD-272.4) Cholesterol is in an excellent Range. I have suggested she stay on her current medication.  Her updated medication list for this problem includes:    Zocor 40 Mg Tabs (Simvastatin)  Problem # 3:  PREMATURE VENTRICULAR CONTRACTIONS (ICD-427.69) rare PVCs by history. This is likely the cause of her occasional palpitations.  Previous echo had suggested some septal hypertrophy this is not particularly notable on clinical exam she has a very faint murmur. I suspect there is no significant gradient.  The following medications were removed from the medication list:    Coumadin 1 Mg Tabs (Warfarin sodium) .Marland Kitchen... Take as directed by the coumadin clinic Her updated medication list for this problem includes:    Coumadin 5 Mg Tabs (Warfarin sodium) ..... (5.5mg ) as directed by coumadin clinic    Diltiazem Hcl Er Beads 180 Mg Xr24h-cap (Diltiazem hcl er beads) .Marland Kitchen... Take one capsule by mouth daily    Nitrostat 0.4 Mg Subl (Nitroglycerin) .Marland Kitchen... 1 tablet under tongue  at onset of chest pain; you may repeat every 5 minutes for up to 3 doses.  Patient Instructions: 1)  Your physician recommends that you schedule a follow-up appointment in: 1 year 2)  Your physician has recommended you make the following change in your medication: STOP Warfarin.  Prescriptions: DILTIAZEM HCL ER BEADS 180 MG XR24H-CAP (DILTIAZEM HCL ER BEADS) Take one capsule by mouth daily  #90 x 3   Entered by:   Lanny Hurst RN   Authorized by:   Dossie Arbour MD   Signed by:   Lanny Hurst RN on 06/03/2010   Method used:   Faxed to ...       [facility]       [address] [place], [place]  [address]       Ph: [phone]       Fax: [phone]   RxID:   [id number]   Appended Document: F6M/AMD Start aspirin 81 mg times one daily  Appended Document: F6M/AMD EKG shows normal sinus rhythm with rate 59 beats per minute, no significant ST changes. There is T-wave abnormality in leads 2, 3, aVF, V4 through V6.

## 2010-07-21 NOTE — Assessment & Plan Note (Signed)
Summary: AFIB/AMD  Nurse Visit   Vital Signs:  Patient profile:   77 year old female Height:      63.5 inches Weight:      179 pounds BMI:     31.32 Pulse rate:   70 / minute BP sitting:   110 / 78  (left arm) Cuff size:   regular  Vitals Entered By: Bishop Dublin, CMA (June 27, 2010 11:16 AM) CC: c/o irreg. heart beats.  Has some chest tightness. Comments Pt in today c/o irregular heartbeats and fatigue. EKG done, shows pt in atrial fibrillation, stable HR. Pt states she noticed her symptoms starting Saturday (2 days ago). She was taken off Coumadin at last ov 06/03/10. She takes Diltiazem 180mg  qd  and has not missed any doses. Dr. Mariah Milling notified. Instructed pt to restart Coumadin on previous dosage 5.5mg  once daily and we will recheck INR in 1 week. Pt will call if any additional symptoms occur.   Past History:  Past Medical History: Last updated: 08/13/2009 PREMATURE VENTRICULAR CONTRACTIONS (ICD-427.69) ATRIAL FIBRILLATION (ICD-427.31)  Past Surgical History: Last updated: September 16, 2009 Abdominal Hysterectomy-Total Tonsillectomy Gall bladder removed Parathyroid removed Veins strep in the 80's  Family History: Last updated: 2009-09-16 Mother: deceased 42: stroke Father: deceased 16; pneumona Brother deceased due to accident 2 brothers: living 109: heart problems                   living 42: healthy  Social History: Last updated: 08/13/2009 Married -- 2 children, 5 grands, 2 great grands Tobacco Use - No.  Regular Exercise - yes  Risk Factors: Alcohol Use: 0 (Sep 16, 2009) Caffeine Use: no (Sep 16, 2009) Exercise: yes (09/16/09)  Risk Factors: Smoking Status: never (2009-09-16)   Visit Type:  Nurse visit   CC:  c/o irreg. heart beats.  Has some chest tightness..   Current Medications (verified): 1)  Diltiazem Hcl Er Beads 180 Mg Xr24h-Cap (Diltiazem Hcl Er Beads) .... Take One Capsule By Mouth Daily 2)  Zocor 40 Mg Tabs (Simvastatin) 3)  Vitamin D  400 Unit  Tabs (Cholecalciferol) .... Once Daily 4)  Nitrostat 0.4 Mg Subl (Nitroglycerin) .Marland Kitchen.. 1 Tablet Under Tongue At Onset of Chest Pain; You May Repeat Every 5 Minutes For Up To 3 Doses. 5)  Zyrtec Allergy 10 Mg Tabs (Cetirizine Hcl) .... As Needed 6)  Calcium Carbonate 600 Mg Tabs (Calcium Carbonate) .... Two Times A Day 7)  Co-Enzyme Q-10 30 Mg Caps (Coenzyme Q10) .... One Tablet Once Daily 8)  Aspir-Low 81 Mg Tbec (Aspirin) .... One Tablet Once Daily  Allergies (verified): 1)  ! Asa 2)  ! Pcn 3)  ! Sulfa  Appended Document: AFIB/AMD would start on pradaxa today, 150 mg by mouth two times a day. Come in for samples If she does not convert on her own in the next few days, would consider start amiodaroneor other antiarrhythmic.  Appended Document: AFIB/AMD Spoke to pt, explained that Dr. Mariah Milling advises she start Pradaxa rather than coumadin at this time, since she went into afib on Saturday that she knows of. Explained that this would be more effective in preventing blood clot rather than waiting for effects of coumadin >4 days. Pt understands his recommendation but does not want to try Pradaxa at this time, she has heard of many reactions after taking. Explained to pt common side effects were indigestion, stomach upset, stomach pain. Pt still refuses to take at this time and would like to continue coumadin.

## 2010-07-21 NOTE — Medication Information (Addendum)
Summary: ROV  Anticoagulant Therapy  Managed by: Cloyde Reams, RN, BSN Referring MD: Julien Nordmann MD PCP: Einar Crow, MD Supervising MD: Gala Romney MD, Reuel Boom Indication 1: Atrial Fibrillation Lab Used: [facility] [place] Site: [place] INR POC 2.8 INR RANGE 2.0-3.0  Dietary changes: no    Health status changes: no    Bleeding/hemorrhagic complications: no    Recent/future hospitalizations: yes       Details: Upcoming colonoscopy in February.    Any changes in medication regimen? no    Recent/future dental: no  Any missed doses?: no       Is patient compliant with meds? yes       Allergies: 1)  ! Asa 2)  ! Pcn 3)  ! Sulfa  Anticoagulation Management History:      The patient is taking warfarin and comes in today for a routine follow up visit.  Positive risk factors for bleeding include an age of 75 years or older.  The bleeding index is 'intermediate risk'.  Negative CHADS2 values include Age > 75 years old.  Anticoagulation responsible provider: Taz Vanness MD, Reuel Boom.  INR POC: 2.8.  Cuvette Lot#: [id number].  Exp: 07/2011.    Anticoagulation Management Assessment/Plan:      The target INR is 2.0-3.0.  The next INR is due 08/10/2010.  Anticoagulation instructions were given to patient.  Results were reviewed/authorized by Cloyde Reams, RN, BSN.  She was notified by Cloyde Reams RN.         Prior Anticoagulation Instructions: INR 2.0  Today take 6mg  then resume regular dosage 5.5 mg every day.  Recheck in 1 week.  Current Anticoagulation Instructions: INR 2.8  Continue on same dosage 5.5mg  daily.  Recheck in 4 weeks.    Appended Document: ROV LMOM TCB-2/21/sab

## 2010-07-21 NOTE — Medication Information (Signed)
Summary: ROV - Coumadin Restart 1 wk  Anticoagulant Therapy  Managed by: Lanny Hurst, RN Referring MD: Julien Nordmann MD PCP: Einar Crow, MD Supervising MD: Julien Nordmann MD Indication 1: Atrial Fibrillation Lab Used: [facility] [place] Site: [place] INR POC 2.0 INR RANGE 2.0-3.0  Dietary changes: no    Health status changes: no    Bleeding/hemorrhagic complications: no    Recent/future hospitalizations: no    Any changes in medication regimen? no    Recent/future dental: no  Any missed doses?: no       Is patient compliant with meds? yes       Allergies: 1)  ! Asa 2)  ! Pcn 3)  ! Sulfa  Anticoagulation Management History:      The patient is taking warfarin and comes in today for a routine follow up visit.  Positive risk factors for bleeding include an age of 54 years or older.  The bleeding index is 'intermediate risk'.  Negative CHADS2 values include Age > 78 years old.  Anticoagulation responsible provider: Julien Nordmann MD.  INR POC: 2.0.  Exp: 05/2011.    Anticoagulation Management Assessment/Plan:      The target INR is 2.0-3.0.  The next INR is due 07/06/2010.  Anticoagulation instructions were given to patient.  Results were reviewed/authorized by Lanny Hurst, RN.  She was notified by Lanny Hurst RN.         Prior Anticoagulation Instructions: INR 2.0 Today take 6mg s then Continue 5.5mg s everyday. Recheck in 4 weeks.   Current Anticoagulation Instructions: INR 2.0  Today take 6mg  then resume regular dosage 5.5 mg every day.  Recheck in 1 week.  Appended Document: ROV - Coumadin Restart 1 wk Next INR scheduled 07/13/10, not 07/06/10.

## 2010-08-08 ENCOUNTER — Telehealth: Payer: Self-pay | Admitting: Cardiovascular Disease

## 2010-08-10 ENCOUNTER — Encounter: Payer: Self-pay | Admitting: Cardiovascular Disease

## 2010-08-10 ENCOUNTER — Encounter (INDEPENDENT_AMBULATORY_CARE_PROVIDER_SITE_OTHER): Payer: Medicare Other

## 2010-08-10 DIAGNOSIS — I4891 Unspecified atrial fibrillation: Secondary | ICD-10-CM

## 2010-08-10 LAB — CONVERTED CEMR LAB: POC INR: 3.4

## 2010-08-16 ENCOUNTER — Encounter: Payer: Self-pay | Admitting: Cardiovascular Disease

## 2010-08-16 ENCOUNTER — Ambulatory Visit (INDEPENDENT_AMBULATORY_CARE_PROVIDER_SITE_OTHER): Payer: Medicare Other | Admitting: Cardiovascular Disease

## 2010-08-16 DIAGNOSIS — I4891 Unspecified atrial fibrillation: Secondary | ICD-10-CM

## 2010-08-16 DIAGNOSIS — E785 Hyperlipidemia, unspecified: Secondary | ICD-10-CM

## 2010-08-16 DIAGNOSIS — I517 Cardiomegaly: Secondary | ICD-10-CM | POA: Insufficient documentation

## 2010-08-16 NOTE — Medication Information (Signed)
Summary: ROV/AMD  Anticoagulant Therapy  Managed by: Cloyde Reams, RN, BSN Referring MD: Julien Nordmann MD PCP: Einar Crow, MD Supervising MD: Mariah Milling Indication 1: Atrial Fibrillation Lab Used: [facility] [place] Site: [place] INR POC 3.4 INR RANGE 2.0-3.0  Dietary changes: no    Health status changes: no    Bleeding/hemorrhagic complications: no    Recent/future hospitalizations: yes       Details: Pending colonoscopy on 09/02/10, per Dr Mariah Milling needs OV to discuss afib and coumadin prior to.   Any changes in medication regimen? no    Recent/future dental: no  Any missed doses?: no       Is patient compliant with meds? yes      Comments: Made OV with Dr Mariah Milling on 08/16/10 to discuss upcoming colonoscopy per Dr Windell Hummingbird request.    Allergies: 1)  ! Asa 2)  ! Pcn 3)  ! Sulfa  Anticoagulation Management History:      The patient is taking warfarin and comes in today for a routine follow up visit.  Positive risk factors for bleeding include an age of 18 years or older.  The bleeding index is 'intermediate risk'.  Negative CHADS2 values include Age > 55 years old.  Anticoagulation responsible provider: gollan.  INR POC: 3.4.  Cuvette Lot#: [id number].  Exp: 06/2011.    Anticoagulation Management Assessment/Plan:      The patient's current anticoagulation dose is Coumadin 5 mg tabs: Take as directed by coumadin clinic., Coumadin 1 mg tabs: Take as directed by coumadin clinic..  The target INR is 2.0-3.0.  The next INR is due 08/31/2010.  Anticoagulation instructions were given to patient.  Results were reviewed/authorized by Cloyde Reams, RN, BSN.  She was notified by Cloyde Reams RN.         Prior Anticoagulation Instructions: INR 2.8  Continue on same dosage 5.5mg  daily.  Recheck in 4 weeks.    Current Anticoagulation Instructions: INR 3.4  Skip today's dosage of Coumadin, then resume same dosage 5.5mg  daily.  Recheck in 3 weeks.    Prescriptions: COUMADIN 5 MG TABS (WARFARIN SODIUM) Take as directed by coumadin clinic. Brand medically necessary #100 x 1   Entered by:   Cloyde Reams RN   Authorized by:   Dossie Arbour MD   Signed by:   Cloyde Reams RN on 08/10/2010   Method used:   Faxed to ...       [facility] (mail-order)       [address] [place], [place]  [address]       Ph: [phone]       Fax: [phone]   RxID:   [id number] COUMADIN 1 MG TABS (WARFARIN SODIUM) Take as directed by coumadin clinic. Brand medically necessary #100 x 1   Entered by:   Cloyde Reams RN   Authorized by:   Dossie Arbour MD   Signed by:   Cloyde Reams RN on 08/10/2010   Method used:   Faxed to ...       [facility] (mail[facility]order)       [address] [place], [place]  [address]       Ph: [phone]       Fax: [phone]   RxID:   [id number]

## 2010-08-16 NOTE — Progress Notes (Signed)
Summary: Procedure  Phone Note Call from Patient Call back at Home Phone [phone]   Caller: Self Call For: Linda [name] Summary of Call: Pt would like to talk to Dr Mariah Milling about a procedure.  Pt left a message on our VM. Initial call taken by: Harlon Flor,  August 08, 2010 4:14 PM  Follow-up for Phone Call        [name] TCB/sab  pt is having colonoscopy on March 16th. Her doctor wants her to go off her coumadin the Sunday before Colonoscopy, which is March 11th. Since going in and out of atrial fibrillation she wanted to know if it was okay to go off her coumadin for that long. Please advise. Follow-up by: Lysbeth Galas CMA,  August 08, 2010 4:43 PM  Additional Follow-up for Phone Call Additional follow up Details #1::        would consider delaying colo until we get her out of atrial fib. Would set up appt to discuss, check EKG     Appended Document: Procedure [facility] TCB-08/09/10/sab  Appended Document: Procedure pt notified today when she came in for coumadin check/sab

## 2010-08-25 ENCOUNTER — Other Ambulatory Visit: Payer: Self-pay | Admitting: Cardiovascular Disease

## 2010-08-25 ENCOUNTER — Other Ambulatory Visit (INDEPENDENT_AMBULATORY_CARE_PROVIDER_SITE_OTHER): Payer: Medicare Other

## 2010-08-25 DIAGNOSIS — I4891 Unspecified atrial fibrillation: Secondary | ICD-10-CM

## 2010-08-25 NOTE — Assessment & Plan Note (Signed)
Summary: prior to colonoscopy on 09/02/10/afib/ewj   Visit Type:  Follow-up Referring Provider:  Mariah Milling Primary Provider:  Einar Crow, MD  CC:  Pre op for colonoscopy March 16 and 2012. Linda [name]  History of Present Illness: [name] is a 77 year old woman with past medical history of paroxysmal atrial fibrillation  on warfarin, normal coronary arteries in September 2007, echocardiogram in 2006 showing moderate asymmetric LVH with no outflow tract obstruction with normal systolic function, occasional break through atrial  fibrillation who presents for routine followup. previous nocturnal oximetry had showed some desaturations though she reports sleeping well and has no significant snoring or apnea.  She converted to atrial fibrillation in early January and has continued in this rhythm since then. Her breathing is not back to normal. She is very active and goes to the gym and is not able to walk as she normally does. She is interested in going back to her normal rhythm. She does have mild edema in her leg so this is chronic and stable. No significant cough or abdominal swelling. No lightheadedness.  Labs from this past week showed total cholesterol 160, LDL 76, HDL 70, normal LFTs, normal CBC  EKG shows atrial fibrillation with ventricular rate 69 beats per minute, T-wave abnormality in leads 2, 3, aVF also V4 through V6  Current Medications (verified): 1)  Diltiazem Hcl Er Beads 180 Mg Xr24h-Cap (Diltiazem Hcl Er Beads) .... Take One Capsule By Mouth Daily 2)  Zocor 40 Mg Tabs (Simvastatin) 3)  Vitamin D 400 Unit  Tabs (Cholecalciferol) .... Once Daily 4)  Nitrostat 0.4 Mg Subl (Nitroglycerin) .Linda [name].. 1 Tablet Under Tongue At Onset of Chest Pain; You May Repeat Every 5 Minutes For Up To 3 Doses. 5)  Zyrtec Allergy 10 Mg Tabs (Cetirizine Hcl) .... As Needed 6)  Calcium Carbonate 600 Mg Tabs (Calcium Carbonate) .... Two Times A Day 7)  Co-Enzyme Q-10 30 Mg Caps (Coenzyme Q10) .... One Tablet Once  Daily 8)  Aspir-Low 81 Mg Tbec (Aspirin) .... One Tablet Once Daily 9)  Coumadin 5 Mg Tabs (Warfarin Sodium) .... Take As Directed By Coumadin Clinic. 10)  Coumadin 1 Mg Tabs (Warfarin Sodium) .... Take As Directed By Coumadin Clinic.  Allergies (verified): 1)  ! Asa 2)  ! Pcn 3)  ! Sulfa  Past History:  Past Medical History: Last updated: 08/13/2009 PREMATURE VENTRICULAR CONTRACTIONS (ICD-427.69) ATRIAL FIBRILLATION (ICD-427.31)  Past Surgical History: Last updated: 09/30/09 Abdominal Hysterectomy-Total Tonsillectomy Gall bladder removed Parathyroid removed Veins strep in the 80's  Family History: Last updated: 09-30-2009 Mother: deceased 1: stroke Father: deceased 33; pneumona Brother deceased due to accident 2 brothers: living 61: heart problems                   living 37: healthy  Social History: Last updated: 08/13/2009 Married -- 2 children, 5 grands, 2 great grands Tobacco Use - No.  Regular Exercise - yes  Risk Factors: Alcohol Use: 0 (09-30-2009) Caffeine Use: no (09/30/09) Exercise: yes (09/30/2009)  Risk Factors: Smoking Status: never (09-30-2009)  Review of Systems       The patient complains of dyspnea on exertion.  The patient denies fever, weight loss, weight gain, vision loss, decreased hearing, hoarseness, chest pain, syncope, peripheral edema, prolonged cough, abdominal pain, incontinence, muscle weakness, depression, and enlarged lymph nodes.    Vital Signs:  Patient profile:   77 year old female Height:      63.5 inches Weight:      177 pounds BMI:  30.97 Pulse rate:   69 / minute BP sitting:   130 / 82  (left arm) Cuff size:   regular  Vitals Entered By: Bishop Dublin, CMA (August 16, 2010 10:42 AM)  Physical Exam  General:  Well developed, well nourished, in no acute distress. Head:  normocephalic and atraumatic Neck:  Neck supple, no JVD. No masses, thyromegaly or abnormal cervical nodes. Lungs:  Clear bilaterally  to auscultation and percussion. Heart:  Non-displaced PMI, chest non-tender; irregular rate and rhythm, S1, S2 with I/VI SEM RSB, rubs or gallops. Carotid upstroke normal, no bruit.  Pedals normal pulses. Trace b/l edema, no varicosities. Abdomen:  Bowel sounds positive; abdomen soft and non-tender without masses Msk:  Back normal, normal gait. Muscle strength and tone normal. Pulses:  pulses normal in all 4 extremities Extremities:  No clubbing or cyanosis. Neurologic:  Alert and oriented x 3. Skin:  Intact without lesions or rashes. Psych:  Normal affect.   Impression & Recommendations:  Problem # 1:  ATRIAL FIBRILLATION (ICD-427.31) currently in atrial fibrillation with excellent rate control though continues to have symptoms. We have suggested she repeat her echocardiogram given her last echo was 5 years ago. If her echo does not show significant structural changes, such as grossly dilated left atrium and mitral regurgitation, we could consider a cardioversion.  Continue diltiazem for rate control. Consider amiodarone around the period when she will be cardioverted.  The following medications were removed from the medication list:    Aspir-low 81 Mg Tbec (Aspirin) ..... One tablet once daily Her updated medication list for this problem includes:    Coumadin 5 Mg Tabs (Warfarin sodium) .Linda [name]... Take as directed by coumadin clinic.    Coumadin 1 Mg Tabs (Warfarin sodium) .Linda [name]... Take as directed by coumadin clinic.  Orders: Echocardiogram (Echo)  Problem # 2:  HYPERLIPIDEMIA-MIXED (ICD-272.4) Continue current medication regimen.  Her updated medication list for this problem includes:    Zocor 40 Mg Tabs (Simvastatin)  Patient Instructions: 1)  Your physician recommends that you schedule a follow-up appointment in: 3 months. We will be in contact with you prior to this visit regarding echo results. 2)  Your physician recommends that you continue on your current medications as  directed. Please refer to the Current Medication list given to you today. 3)  Your physician has requested that you have an echocardiogram.  Echocardiography is a painless test that uses sound waves to create images of your heart. It provides your doctor with information about the size and shape of your heart and how well your heart's chambers and valves are working.  This procedure takes approximately one hour. There are no restrictions for this procedure.

## 2010-08-30 ENCOUNTER — Telehealth: Payer: Self-pay | Admitting: Cardiovascular Disease

## 2010-08-31 ENCOUNTER — Encounter (INDEPENDENT_AMBULATORY_CARE_PROVIDER_SITE_OTHER): Payer: Medicare Other

## 2010-08-31 ENCOUNTER — Encounter: Payer: Self-pay | Admitting: Cardiovascular Disease

## 2010-08-31 DIAGNOSIS — Z7901 Long term (current) use of anticoagulants: Secondary | ICD-10-CM

## 2010-08-31 DIAGNOSIS — I4891 Unspecified atrial fibrillation: Secondary | ICD-10-CM

## 2010-08-31 LAB — CONVERTED CEMR LAB: POC INR: 3

## 2010-09-05 ENCOUNTER — Ambulatory Visit (INDEPENDENT_AMBULATORY_CARE_PROVIDER_SITE_OTHER): Payer: Medicare Other | Admitting: Cardiovascular Disease

## 2010-09-05 ENCOUNTER — Encounter: Payer: Self-pay | Admitting: Cardiovascular Disease

## 2010-09-05 DIAGNOSIS — R609 Edema, unspecified: Secondary | ICD-10-CM

## 2010-09-05 DIAGNOSIS — I4891 Unspecified atrial fibrillation: Secondary | ICD-10-CM

## 2010-09-06 NOTE — Progress Notes (Signed)
Summary: Results  Phone Note Call from Patient Call back at Home Phone [phone]   Caller: Self Call For: Kalmen Lollar Summary of Call: Pt would like to know the results of her heart catherization. Initial call taken by: Harlon Flor,  August 30, 2010 9:22 AM  Follow-up for Phone Call        Spoke to pt, gave her preliminary results of Echo (not heart cath), pt is asking if this has determined whether she will have cardioversion for A fib? Please advise. Follow-up by: Lanny Hurst RN,  August 30, 2010 11:37 AM  Additional Follow-up for Phone Call Additional follow up Details #1::        Left atrium is moderate to severe dilated and she has mitral regurg. Both of these make it difficult to hold in normal rhythm. We could try with an electric medication. I suspect it might convert back to atrial fib with any small stress to her system. I would leave it up to her depending on how she feels     Appended Document: Results Called pt, LMOM TCB

## 2010-09-06 NOTE — Medication Information (Signed)
Summary: Linda [name]  Anticoagulant Therapy  Managed by: Cloyde Reams, RN, BSN Referring MD: Julien Nordmann MD PCP: Einar Crow, MD Supervising MD: Mariah Milling Indication 1: Atrial Fibrillation Lab Used: [facility] [place] Site: [place] INR POC 3.0 INR RANGE 2.0-3.0  Dietary changes: no    Health status changes: no    Bleeding/hemorrhagic complications: no    Recent/future hospitalizations: no    Any changes in medication regimen? no    Recent/future dental: no  Any missed doses?: no       Is patient compliant with meds? yes       Allergies: 1)  ! Asa 2)  ! Pcn 3)  ! Sulfa  Anticoagulation Management History:      The patient is taking warfarin and comes in today for a routine follow up visit.  Positive risk factors for bleeding include an age of 75 years or older.  The bleeding index is 'intermediate risk'.  Negative CHADS2 values include Age > 25 years old.  Anticoagulation responsible provider: Vicky Schleich.  INR POC: 3.0.  Cuvette Lot#: [id number].  Exp: 06/2011.    Anticoagulation Management Assessment/Plan:      The patient's current anticoagulation dose is Coumadin 5 mg tabs: Take as directed by coumadin clinic., Coumadin 1 mg tabs: Take as directed by coumadin clinic..  The target INR is 2.0-3.0.  The next INR is due 09/28/2010.  Anticoagulation instructions were given to patient.  Results were reviewed/authorized by Cloyde Reams, RN, BSN.  She was notified by Cloyde Reams RN.         Prior Anticoagulation Instructions: INR 3.4  Skip today's dosage of Coumadin, then resume same dosage 5.5mg  daily.  Recheck in 3 weeks.   Current Anticoagulation Instructions: INR 3.0  Take 1mg  today, then resume same dosage 5.5mg  daily.  Recheck in 4 weeks.

## 2010-09-15 NOTE — Assessment & Plan Note (Signed)
Summary: ROV; DISCUSS RESULTS FROM ECHO   Visit Type:  Follow-up Referring Provider:  Mariah Milling Primary Provider:  Einar Crow, MD  CC:  Discuss medications & diagnosis. Occas. has a smothering feeling.Marland Kitchen  History of Present Illness: Linda [name] is a 77 year old woman with past medical history of paroxysmal atrial fibrillation  on warfarin, normal coronary arteries in September 2007, echocardiogram in 2006 showing moderate asymmetric LVH with no outflow tract obstruction with normal systolic function, occasional break through atrial  fibrillation, previous nocturnal oximetry had showed some desaturations though she reports sleeping well and has no significant snoring or apnea, who presents for followup of her atrial fibrillation.  She had an echocardiogram to determine if she would be a appropriate candidate for cardioversion. Echo cardiac showed normal systolic function, mild to moderate MR, moderate to severely dilated left atrium.   She has maintained atrial fibrillation over the past several months with no significant symptoms with exertion with adequate rate control on diltiazem.  She does have mild edema in her leg so this is chronic and stable. No significant cough or abdominal swelling. No lightheadedness.  she does report occasional episodes of tachypalpitations. When she has these, she sits and they seem to resolve themselves  Labs from this past week showed total cholesterol 160, LDL 76, HDL 70, normal LFTs, normal CBC  EKG shows atrial fibrillation with ventricular rate 74 beats per minute, T-wave abnormality in leads 2, 3, aVF also V4 through V6  Current Medications (verified): 1)  Diltiazem Hcl Er Beads 180 Mg Xr24h-Cap (Diltiazem Hcl Er Beads) .... Take One Capsule By Mouth Daily 2)  Zocor 40 Mg Tabs (Simvastatin) 3)  Vitamin D 400 Unit  Tabs (Cholecalciferol) .... Once Daily 4)  Nitrostat 0.4 Mg Subl (Nitroglycerin) .Marland Kitchen.. 1 Tablet Under Tongue At Onset of Chest Pain; You May  Repeat Every 5 Minutes For Up To 3 Doses. 5)  Zyrtec Allergy 10 Mg Tabs (Cetirizine Hcl) .... As Needed 6)  Calcium Carbonate 600 Mg Tabs (Calcium Carbonate) .... Two Times A Day 7)  Co-Enzyme Q-10 30 Mg Caps (Coenzyme Q10) .... One Tablet Once Daily 8)  Coumadin 5 Mg Tabs (Warfarin Sodium) .... Take As Directed By Coumadin Clinic. 9)  Coumadin 1 Mg Tabs (Warfarin Sodium) .... Take As Directed By Coumadin Clinic.  Allergies (verified): 1)  ! Asa 2)  ! Pcn 3)  ! Sulfa  Past History:  Past Medical History: Last updated: 08/13/2009 PREMATURE VENTRICULAR CONTRACTIONS (ICD-427.69) ATRIAL FIBRILLATION (ICD-427.31)  Past Surgical History: Last updated: 2009/09/20 Abdominal Hysterectomy-Total Tonsillectomy Gall bladder removed Parathyroid removed Veins strep in the 80's  Family History: Last updated: September 20, 2009 Mother: deceased 51: stroke Father: deceased 68; pneumona Brother deceased due to accident 2 brothers: living 15: heart problems                   living 4: healthy  Social History: Last updated: 08/13/2009 Married -- 2 children, 5 grands, 2 great grands Tobacco Use - No.  Regular Exercise - yes  Risk Factors: Alcohol Use: 0 (09-20-09) Caffeine Use: no (2009-09-20) Exercise: yes (20-Sep-2009)  Risk Factors: Smoking Status: never (09-20-2009)  Review of Systems  The patient denies fever, weight loss, weight gain, vision loss, decreased hearing, hoarseness, chest pain, syncope, dyspnea on exertion, peripheral edema, prolonged cough, abdominal pain, incontinence, muscle weakness, depression, and enlarged lymph nodes.         occasional episodes of tachypalpitations  Vital Signs:  Patient profile:   77 year old female Height:  63.5 inches Weight:      180 pounds BMI:     31.50 Pulse rate:   72 / minute BP sitting:   120 / 78  (left arm) Cuff size:   regular  Vitals Entered By: Bishop Dublin, CMA (September 05, 2010 11:17 AM)  Physical Exam  General:   Well developed, well nourished, in no acute distress. Head:  normocephalic and atraumatic Neck:  Neck supple, no JVD. No masses, thyromegaly or abnormal cervical nodes. Lungs:  Clear bilaterally to auscultation and percussion. Heart:  Non-displaced PMI, chest non-tender; irregular rate and rhythm, S1, S2 with I/VI SEM RSB, rubs or gallops. Carotid upstroke normal, no bruit.  Pedals normal pulses. Trace b/l edema, no varicosities. Abdomen:  Bowel sounds positive; abdomen soft and non-tender without masses Msk:  Back normal, normal gait. Muscle strength and tone normal. Pulses:  pulses normal in all 4 extremities Extremities:  No clubbing or cyanosis. Neurologic:  Alert and oriented x 3. Skin:  Intact without lesions or rashes. Psych:  Normal affect.   Impression & Recommendations:  Problem # 1:  ATRIAL FIBRILLATION (ICD-427.31) rate is well controlled. She is not interested in cardioversion at this time. We spent at least half an hour going over her echocardiogram with her and showing her the dilated left atrium, mitral regurgitation. We pulled up the pictures and showed her these images.  She is curious about pradaxa and will find out the cost with her Medicare coverage. for her periods or tachypalpitations, we have given her diltiazem 30 mg to take p.r.n..  Her updated medication list for this problem includes:    Coumadin 5 Mg Tabs (Warfarin sodium) .Marland Kitchen... Take as directed by coumadin clinic.    Coumadin 1 Mg Tabs (Warfarin sodium) .Marland Kitchen... Take as directed by coumadin clinic.  Problem # 2:  HYPERLIPIDEMIA-MIXED (ICD-272.4) Would continue Zocor at its current dose for now..  Her updated medication list for this problem includes:    Zocor 40 Mg Tabs (Simvastatin)  Problem # 3:  EDEMA (ICD-782.3) She does have some edema though this is likely from venous insufficiency. She wears TED hose, does leg elevation.  Patient Instructions: 1)  Your physician recommends that you schedule a  follow-up appointment in: 6 months 2)  Your physician has recommended you make the following change in your medication: START Diltiazem HCL 30 mg three times a day as needed. Prescriptions: DILTIAZEM HCL 30 MG TABS (DILTIAZEM HCL) Take one tablet by mouth three times a day as needed  #90 x 3   Entered by:   Lanny Hurst RN   Authorized by:   Dossie Arbour MD   Signed by:   Lanny Hurst RN on 09/05/2010   Method used:   Electronically to        [facility] [phone]* (retail)       [address] [place], [place]  [address]       Ph: [phone] or [phone]       Fax: [phone]   RxID:   [id number]

## 2010-09-28 ENCOUNTER — Ambulatory Visit (INDEPENDENT_AMBULATORY_CARE_PROVIDER_SITE_OTHER): Payer: Medicare Other | Admitting: Emergency Medicine

## 2010-09-28 DIAGNOSIS — Z7189 Other specified counseling: Secondary | ICD-10-CM | POA: Insufficient documentation

## 2010-09-28 DIAGNOSIS — Z7901 Long term (current) use of anticoagulants: Secondary | ICD-10-CM

## 2010-09-28 DIAGNOSIS — I4891 Unspecified atrial fibrillation: Secondary | ICD-10-CM

## 2010-09-28 LAB — POCT INR: INR: 3.4

## 2010-10-13 ENCOUNTER — Encounter: Payer: Self-pay | Admitting: Cardiovascular Disease

## 2010-10-19 ENCOUNTER — Ambulatory Visit (INDEPENDENT_AMBULATORY_CARE_PROVIDER_SITE_OTHER): Payer: Medicare Other | Admitting: Emergency Medicine

## 2010-10-19 DIAGNOSIS — Z7901 Long term (current) use of anticoagulants: Secondary | ICD-10-CM

## 2010-10-19 DIAGNOSIS — I4891 Unspecified atrial fibrillation: Secondary | ICD-10-CM

## 2010-10-19 LAB — POCT INR: INR: 3.2

## 2010-11-02 ENCOUNTER — Telehealth: Payer: Self-pay | Admitting: *Deleted

## 2010-11-02 NOTE — Telephone Encounter (Signed)
Pt called today stating that her PCP (Dr. Einar Crow) started her on Doxycycline 100mg  x 7 days. She will start med today. Pt is on coumadin and wanted to see if dose needed to be changed. Pt is also leaving to go out of town on Monday 11/07/10 and will return Thursday 5/24. Spoke to [place] in the coumadin clinic as well. Advised pt, since last INR on 10/19/10 was 3.2, to come in this Friday 5/18 to check INR prior to going out of town. Since this will not be long after starting abx, pt will also f/u in one more week on Fri 5/25 after returning from out of town. Pt ok with this.

## 2010-11-04 ENCOUNTER — Other Ambulatory Visit (INDEPENDENT_AMBULATORY_CARE_PROVIDER_SITE_OTHER): Payer: Medicare Other | Admitting: *Deleted

## 2010-11-04 DIAGNOSIS — I4891 Unspecified atrial fibrillation: Secondary | ICD-10-CM

## 2010-11-04 LAB — POCT INR: INR: 3.5

## 2010-11-04 NOTE — Cardiovascular Report (Signed)
NAMEJULIE, Linda [name]                 ACCOUNT NO.:  [id number]   MEDICAL RECORD NO.:  [id number]          PATIENT TYPE:  INP   LOCATION:  2035                         FACILITY:  [facility]   PHYSICIAN:  Darlin Priestly, MD  DATE OF BIRTH:  09/28/1933   DATE OF PROCEDURE:  02/21/2006  DATE OF DISCHARGE:                              CARDIAC CATHETERIZATION   PROCEDURES:  1. Left heart catheterization.  2. Coronary angiography.  3. Left ventriculogram.   COMPLICATIONS:  None.   INDICATIONS:  Ms. Haycraft is a 77 year old female with history of new onset  atrial fibrillation.  She has had remote Cardiolite scan suggesting mild  lateral ischemia.  She has remained completely asymptomatic.  Because of her  new onset atrial fibrillation, she is now admitted for IV anticoagulation  and cardiac catheterization prior to Coumadin initiation.   DESCRIPTION OF OPERATION:  After obtaining informed consent, the patient was  brought to the cardiac catheterization lab.  The right and left groins were  shaved, prepped, and draped in the usual sterile fashion.  ECG monitoring  was established.  Using modified Seldinger technique, a #5 French arterial  sheath was inserted in the right femoral artery.  A 5 French diagnostic  catheter was used to perform diagnostic angiography.   The left main was a large vessel, with no significant disease.   The LAD is a large vessel which courses to the apex.  It gives rise to one  diagonal branch.  The LAD has no significant disease.   The first diagonal is a medium-size vessel which bifurcates distally, with  no significant disease.   The left coronary artery also gives rise to a large ramus intermedius which  bifurcates distally.  The ramus has no significant disease.   Left circumflex is a medium-size vessel coursing the AV groove and gives  rise to two obtuse marginal branches.  The AV groove circumflex has no  significant disease.   The first and second OMs are  small to medium-size vessels, with no  significant disease.   The right coronary artery is a large vessel which is dominant and gives rise  to both PDA as well as posterolateral branch.  There is no significant  disease in the RCA, PDA, or posterolateral branch.   Left ventriculogram reveals a preserved EF at 70%.   HEMODYNAMICS:  Systemic arterial pressure 129/67, LV systemic pressure  128/5, LVEDP of 14.   CONCLUSIONS:  1. No significant coronary artery disease.  2. Normal left ventricular systolic function.      Darlin Priestly, MD  Electronically Signed     RHM/MEDQ  D:  02/21/2006  T:  02/22/2006  Job:  [id number]   cc:   Lorie Phenix

## 2010-11-04 NOTE — Discharge Summary (Signed)
Linda [name], BIRENBAUM NO.:  [id number]   MEDICAL RECORD NO.:  [id number]          PATIENT TYPE:  INP   LOCATION:  2035                         FACILITY:  [facility]   PHYSICIAN:  Darlin Priestly, MD  DATE OF BIRTH:  1934/05/31   DATE OF ADMISSION:  02/20/2006  DATE OF DISCHARGE:                                 DISCHARGE SUMMARY   PRIMARY CARE PHYSICIAN:  Dr. Lorie Phenix, [place], [place].   FINAL DIAGNOSES:  1. New onset atrial fibrillation with rapid ventricular response.  2. Abnormal Cardiolite stress test revealing lateral ischemia.  3. Cardiac catheterization revealing normal coronary arteries.  4. Normal left ventricular function by 2-dimensional echocardiogram      November 2006.  5. Mild mitral regurgitation.  6. Dyslipidemia.   HISTORY:  Briefly, Ms. Olds is a very pleasant 77 year old patient who was  admitted on February 20, 2006 after presenting to our [place] office with  complaints of intermittent palpitations.  She has been experiencing  palpitations for several months and has previously declined a vent monitor  placement in order to evaluate these.  She has been placed on low dose beta  blocker therapy which she did not tolerate.  On evaluation on February 20, 2006 EKG revealed atrial fibrillation with a ventricular rate of 105 beats  per minute.  With this she was asymptomatic with no chest pain or shortness  of breath.  Because of the new onset of atrial fibrillation with rapid  ventricular response and an abnormal Cardiolite stress test previously  revealing lateral ischemia, Dr. Jenne Campus felt it best to admit her and  proceed with cardiac catheterization prior to Coumadin initiation and she  was admitted to [facility] with subcutaneously Lovenox and IV Diltiazem for  rate control and cardiac catheterization would be planned for February 21, 2006.   PROCEDURE:  Left heart catheterization and coronary arteriogram were  performed on February 21, 2006 by Dr. Lenise Herald, revealing normal  coronary arteries.  She tolerated the procedure well without complications.   HOSPITAL COURSE:  Ms. Zwicker was admitted to cardiac telemetry in stable  condition.  Vital signs were stable.  Her monitor revealed atrial  fibrillation with a rate in the 80's.  She underwent cardiac catheterization  as described above without complications and was returned to her room late  in the day on February 21, 2006.  Because of the new onset of atrial  fibrillation, a pharmacy consult was requested for Lovenox to Coumadin cross-  over.  The patient requested at that time to stay overnight in order to  obtain that information and continue to be monitored.  We felt that was  appropriate given that she was starting Lovenox post catheterization.  Her  heart rate remained stable with rates in the 70's and 80's.  She has  ambulated in the hallway without difficulty with no drainage or hematoma  noted at the catheterization site.  Overall, she appears stable from a  cardiac standpoint and is deemed ready for discharge.   LABORATORY DATA:  TSH on admission was 2.24.  Hemoglobin 15.9.  Potassium  was 3.5.  BUN 18, creatinine 0.8. Cardiac markers were negative x3.   DISCHARGE INSTRUCTIONS:  Ms. Yore has been given written instructions on  activity and medications.  We have encourage a heart-healthy, low sodium  diet.  We have asked her to avoid driving for the next two days and no heavy  lifting for two weeks.  She is to increase he activity slowly. She may  shower or bathe and may walk up the steps.  She is to contact our [place]  office at [phone] with any problems or questions or call with redness,  swelling or drainage from her catheterization site.  She is to followup with  Dr. Jenne Campus in [place] on March 06, 2006 at 2:30 in the evening.  She  will also need blood work drawn on February 23, 2006 as well as February 26, 2006  and have those results faxed to the [place] office at [phone].   DISCHARGE MEDICATIONS:  1. Cardizem CD 180 mg daily.  2. Vytorin 10/20 mg daily.  3. Aspirin 81 mg daily.  4. Lovenox 80 mg subcutaneously b.i.d.  5. Coumadin 5 mg daily.  6. Actonel 35 mg weekly.     ______________________________  Charmian Muff, NP      Darlin Priestly, MD  Electronically Signed    LS/MEDQ  D:  02/22/2006  T:  02/22/2006  Job:  [id number]   cc:   Lorie Phenix

## 2010-11-11 ENCOUNTER — Other Ambulatory Visit (INDEPENDENT_AMBULATORY_CARE_PROVIDER_SITE_OTHER): Payer: Medicare Other | Admitting: *Deleted

## 2010-11-11 DIAGNOSIS — I4891 Unspecified atrial fibrillation: Secondary | ICD-10-CM

## 2010-11-11 LAB — POCT INR: INR: 2.6

## 2010-11-15 ENCOUNTER — Ambulatory Visit (INDEPENDENT_AMBULATORY_CARE_PROVIDER_SITE_OTHER): Payer: Medicare Other | Admitting: Cardiovascular Disease

## 2010-11-15 ENCOUNTER — Encounter: Payer: Self-pay | Admitting: Cardiovascular Disease

## 2010-11-15 DIAGNOSIS — I4891 Unspecified atrial fibrillation: Secondary | ICD-10-CM

## 2010-11-15 DIAGNOSIS — R609 Edema, unspecified: Secondary | ICD-10-CM

## 2010-11-15 DIAGNOSIS — I517 Cardiomegaly: Secondary | ICD-10-CM

## 2010-11-15 DIAGNOSIS — I4949 Other premature depolarization: Secondary | ICD-10-CM

## 2010-11-15 DIAGNOSIS — E785 Hyperlipidemia, unspecified: Secondary | ICD-10-CM

## 2010-11-15 DIAGNOSIS — Z7901 Long term (current) use of anticoagulants: Secondary | ICD-10-CM

## 2010-11-15 NOTE — Assessment & Plan Note (Signed)
Edema is likely secondary to venous insufficiency. This comes and goes. No significant edema on today's visit

## 2010-11-15 NOTE — Patient Instructions (Signed)
You are doing well. No medication changes were made. Please call us if you have new issues that need to be addressed before your next appt.  We will call you for a follow up Appt. In 12 months  

## 2010-11-15 NOTE — Assessment & Plan Note (Signed)
Rate controlled atrial fibrillation with no symptoms. She has a very large left atrium, mild to moderate mitral regurgitation. Cardioversion would likely be unsuccessful either initially or would not hold her in regular rhythm given the size of her left atrium. We will continue rate control as she is asymptomatic.

## 2010-11-15 NOTE — Assessment & Plan Note (Signed)
Cholesterol is at goal on the current lipid regimen. No changes to the medications were made.  

## 2010-11-15 NOTE — Progress Notes (Signed)
   Patient ID: Linda [name], female    DOB: [birth year], 77 y.o.   MRN: [id number]  HPI Comments: Linda [name] is a 77 year old woman with past medical history of chronic atrial fibrillation on warfarin, normal coronary arteries in September 2007,  previous nocturnal oximetry had showed some desaturations though she reports sleeping well and has no significant snoring or apnea, who presents for followup of her atrial fibrillation.   Overall she reports that she is doing well. She denies any significant shortness of breath. She goes to the gym 3 times a week. She uses the treadmill for 1-1/2 miles each time, uses a bike and then does weights. She has no problems and has no other complaints. She did have an episode recently where she took a bus trip and had some lower extremity ankle edema. This resolved after leg elevation. She is tolerating her medications well.    EKG shows atrial fibrillation with ventricular rate 70 beats per minute, T-wave abnormality in leads 2, 3, aVF also V4 through V6      Review of Systems  Constitutional: Negative.   HENT: Negative.   Eyes: Negative.   Respiratory: Negative.   Cardiovascular: Negative.   Gastrointestinal: Negative.   Musculoskeletal: Negative.   Skin: Negative.   Neurological: Negative.   Hematological: Negative.   Psychiatric/Behavioral: Negative.   All other systems reviewed and are negative.    BP 110/72  Pulse 76  Ht 5\' 3"  (1.6 m)  Wt 177 lb (80.287 kg)  BMI 31.35 kg/m2   Physical Exam  Nursing note and vitals reviewed. Constitutional: She is oriented to person, place, and time. She appears well-developed and well-nourished.  HENT:  Head: Normocephalic.  Nose: Nose normal.  Mouth/Throat: Oropharynx is clear and moist.  Eyes: Conjunctivae are normal. Pupils are equal, round, and reactive to light.  Neck: Normal range of motion. Neck supple. No JVD present.  Cardiovascular: Normal rate, S1 normal, S2 normal and intact distal pulses.   An irregularly irregular rhythm present. Exam reveals no gallop and no friction rub.   Murmur heard.  Crescendo systolic murmur is present with a grade of 2/6  Pulmonary/Chest: Effort normal and breath sounds normal. No respiratory distress. She has no wheezes. She has no rales. She exhibits no tenderness.  Abdominal: Soft. Bowel sounds are normal. She exhibits no distension. There is no tenderness.  Musculoskeletal: Normal range of motion. She exhibits no edema and no tenderness.  Lymphadenopathy:    She has no cervical adenopathy.  Neurological: She is alert and oriented to person, place, and time. Coordination normal.  Skin: Skin is warm and dry. No rash noted. No erythema.  Psychiatric: She has a normal mood and affect. Her behavior is normal. Judgment and thought content normal.         Assessment and Plan

## 2010-11-16 ENCOUNTER — Encounter: Payer: Medicare Other | Admitting: Emergency Medicine

## 2010-11-22 ENCOUNTER — Telehealth: Payer: Self-pay

## 2010-11-22 NOTE — Telephone Encounter (Signed)
Linda [name] started on Cipro yesterday for UTI; she had blood in urine. She has a PT/INR tomorrow. She can be reached at [phone].

## 2010-11-22 NOTE — Telephone Encounter (Signed)
Called spoke with pt, saw primary MD yesterday dx with UTI, pt having hematuria INR checked yesterday in primary MD office INR 2.7. Pt was started on Cipro bid x 5 days yesterday.  Cancelled appt in [place] [facility] for tomorrow, r/s to Friday 11/25/10 to check after several doses of Cipro.

## 2010-11-23 ENCOUNTER — Encounter: Payer: Medicare Other | Admitting: Emergency Medicine

## 2010-11-25 ENCOUNTER — Other Ambulatory Visit (INDEPENDENT_AMBULATORY_CARE_PROVIDER_SITE_OTHER): Payer: Medicare Other | Admitting: *Deleted

## 2010-11-25 DIAGNOSIS — I4891 Unspecified atrial fibrillation: Secondary | ICD-10-CM

## 2010-11-25 DIAGNOSIS — Z7901 Long term (current) use of anticoagulants: Secondary | ICD-10-CM

## 2010-12-07 ENCOUNTER — Ambulatory Visit (INDEPENDENT_AMBULATORY_CARE_PROVIDER_SITE_OTHER): Payer: Medicare Other | Admitting: Emergency Medicine

## 2010-12-07 DIAGNOSIS — Z7901 Long term (current) use of anticoagulants: Secondary | ICD-10-CM

## 2010-12-07 DIAGNOSIS — I4891 Unspecified atrial fibrillation: Secondary | ICD-10-CM

## 2011-01-04 ENCOUNTER — Ambulatory Visit (INDEPENDENT_AMBULATORY_CARE_PROVIDER_SITE_OTHER): Payer: Medicare Other | Admitting: Emergency Medicine

## 2011-01-04 DIAGNOSIS — Z7901 Long term (current) use of anticoagulants: Secondary | ICD-10-CM

## 2011-01-04 DIAGNOSIS — I4891 Unspecified atrial fibrillation: Secondary | ICD-10-CM

## 2011-01-27 ENCOUNTER — Telehealth: Payer: Self-pay

## 2011-01-27 MED ORDER — DILTIAZEM HCL ER COATED BEADS 180 MG PO CP24: 180.0000 mg | ORAL_CAPSULE | Freq: Every day | ORAL | Status: DC

## 2011-01-27 NOTE — Telephone Encounter (Signed)
Called in an order for diltiazem cd 180 mg take one tablet daily for 90 day supply to [facility].

## 2011-02-01 ENCOUNTER — Ambulatory Visit (INDEPENDENT_AMBULATORY_CARE_PROVIDER_SITE_OTHER): Payer: Medicare Other | Admitting: Emergency Medicine

## 2011-02-01 DIAGNOSIS — I4891 Unspecified atrial fibrillation: Secondary | ICD-10-CM

## 2011-02-01 DIAGNOSIS — Z7901 Long term (current) use of anticoagulants: Secondary | ICD-10-CM

## 2011-02-01 LAB — POCT INR: INR: 3.8

## 2011-02-15 ENCOUNTER — Ambulatory Visit (INDEPENDENT_AMBULATORY_CARE_PROVIDER_SITE_OTHER): Payer: Medicare Other | Admitting: Emergency Medicine

## 2011-02-15 DIAGNOSIS — I4891 Unspecified atrial fibrillation: Secondary | ICD-10-CM

## 2011-02-15 DIAGNOSIS — Z7901 Long term (current) use of anticoagulants: Secondary | ICD-10-CM

## 2011-02-22 ENCOUNTER — Ambulatory Visit (INDEPENDENT_AMBULATORY_CARE_PROVIDER_SITE_OTHER): Payer: Medicare Other | Admitting: Emergency Medicine

## 2011-02-22 DIAGNOSIS — Z7901 Long term (current) use of anticoagulants: Secondary | ICD-10-CM

## 2011-02-22 DIAGNOSIS — I4891 Unspecified atrial fibrillation: Secondary | ICD-10-CM

## 2011-03-22 ENCOUNTER — Ambulatory Visit (INDEPENDENT_AMBULATORY_CARE_PROVIDER_SITE_OTHER): Payer: Medicare Other | Admitting: Emergency Medicine

## 2011-03-22 DIAGNOSIS — Z7901 Long term (current) use of anticoagulants: Secondary | ICD-10-CM

## 2011-03-22 DIAGNOSIS — I4891 Unspecified atrial fibrillation: Secondary | ICD-10-CM

## 2011-03-22 LAB — POCT INR: INR: 2.5

## 2011-04-18 ENCOUNTER — Telehealth: Payer: Self-pay

## 2011-04-18 ENCOUNTER — Other Ambulatory Visit: Payer: Self-pay | Admitting: Cardiovascular Disease

## 2011-04-18 MED ORDER — COUMADIN 5 MG PO TABS: 5.0000 mg | ORAL_TABLET | Freq: Every day | ORAL | Status: DC

## 2011-04-18 MED ORDER — DILTIAZEM HCL ER COATED BEADS 180 MG PO CP24: 180.0000 mg | ORAL_CAPSULE | Freq: Every day | ORAL | Status: DC

## 2011-04-18 NOTE — Telephone Encounter (Signed)
Refill sent for coumadin

## 2011-04-18 NOTE — Telephone Encounter (Signed)
Refill sent for coumadin & diltiazem

## 2011-04-19 ENCOUNTER — Ambulatory Visit (INDEPENDENT_AMBULATORY_CARE_PROVIDER_SITE_OTHER): Payer: Medicare Other | Admitting: Emergency Medicine

## 2011-04-19 DIAGNOSIS — I4891 Unspecified atrial fibrillation: Secondary | ICD-10-CM

## 2011-04-19 DIAGNOSIS — Z7901 Long term (current) use of anticoagulants: Secondary | ICD-10-CM

## 2011-04-19 LAB — POCT INR: INR: 3.2

## 2011-05-17 ENCOUNTER — Ambulatory Visit (INDEPENDENT_AMBULATORY_CARE_PROVIDER_SITE_OTHER): Payer: Medicare Other | Admitting: Emergency Medicine

## 2011-05-17 DIAGNOSIS — I4891 Unspecified atrial fibrillation: Secondary | ICD-10-CM

## 2011-05-17 DIAGNOSIS — Z7901 Long term (current) use of anticoagulants: Secondary | ICD-10-CM

## 2011-06-21 ENCOUNTER — Ambulatory Visit (INDEPENDENT_AMBULATORY_CARE_PROVIDER_SITE_OTHER): Payer: Medicare Other | Admitting: Emergency Medicine

## 2011-06-21 DIAGNOSIS — I4891 Unspecified atrial fibrillation: Secondary | ICD-10-CM

## 2011-06-21 DIAGNOSIS — Z7901 Long term (current) use of anticoagulants: Secondary | ICD-10-CM

## 2011-07-20 ENCOUNTER — Ambulatory Visit: Payer: Self-pay | Admitting: Internal Medicine

## 2011-07-26 ENCOUNTER — Ambulatory Visit (INDEPENDENT_AMBULATORY_CARE_PROVIDER_SITE_OTHER): Payer: Medicare Other | Admitting: Emergency Medicine

## 2011-07-26 DIAGNOSIS — I4891 Unspecified atrial fibrillation: Secondary | ICD-10-CM

## 2011-07-26 DIAGNOSIS — Z7901 Long term (current) use of anticoagulants: Secondary | ICD-10-CM

## 2011-08-09 ENCOUNTER — Telehealth: Payer: Self-pay

## 2011-08-09 NOTE — Telephone Encounter (Signed)
Is scheduled for a colonoscopy August 21, 2011 with GI @ [facility] clinic. Can she hold the coumadin for 5 days prior without lovenox bridge? Please advise what to do?

## 2011-08-09 NOTE — Telephone Encounter (Signed)
Would  hold warfarin 5 days before colo. Then start lovenox on the back end for 5 days lovenox 1 mg/kg BID for 5 days starting the night of the procedure,  start warfarin the night of the procedure.  We can teach her how to do lovenox if she needs help

## 2011-08-10 NOTE — Telephone Encounter (Signed)
Spoke with Aundra Millet, RN at [facility].  Advised of Dr Windell Hummingbird recommendations to bridge with Lovenox post procedure.  Advised we will see pt in office on 08/16/11 and advise when to stop Coumadin and we will bridge pt with Lovenox.    Called spoke with pt advised ok per Dr Mariah Milling to hold Coumadin 5 days prior to colonoscopy on 08/21/11, and to bridge pt with Lovenox after procedure.  No Lovenox before procedure.  Last dose of Coumadin on 08/15/11.  Made OV to see pt in [place] [facility] on 08/16/11 to set up Lovenox bridge to start after procedure.  Pt aware.

## 2011-08-16 ENCOUNTER — Ambulatory Visit (INDEPENDENT_AMBULATORY_CARE_PROVIDER_SITE_OTHER): Payer: Medicare Other

## 2011-08-16 DIAGNOSIS — I4891 Unspecified atrial fibrillation: Secondary | ICD-10-CM

## 2011-08-16 DIAGNOSIS — Z7901 Long term (current) use of anticoagulants: Secondary | ICD-10-CM

## 2011-08-16 LAB — POCT INR: INR: 3.4

## 2011-08-16 MED ORDER — ENOXAPARIN SODIUM 80 MG/0.8ML ~~LOC~~ SOLN: 80.0000 mg | Freq: Two times a day (BID) | SUBCUTANEOUS | Status: DC

## 2011-08-16 NOTE — Patient Instructions (Signed)
Last dose of Coumadin on 08/15/11, continue to hold Coumadin until after procedure on 08/21/11.  Resume Coumadin post procedure day of procedure if OK with MD, take 1.5 tablets (7.5mg ) x 2 days then resume 5mg  daily.  Start taking Lovenox 80mg  on 08/21/11 pm of procedure day if OK with MD, continue on Lovenox 80mg  every 12 hrs until recheck on 08/25/11.

## 2011-08-21 ENCOUNTER — Ambulatory Visit: Payer: Self-pay | Admitting: Unknown Physician Specialty

## 2011-08-25 ENCOUNTER — Ambulatory Visit (INDEPENDENT_AMBULATORY_CARE_PROVIDER_SITE_OTHER): Payer: Medicare Other | Admitting: *Deleted

## 2011-08-25 DIAGNOSIS — Z7901 Long term (current) use of anticoagulants: Secondary | ICD-10-CM

## 2011-08-25 DIAGNOSIS — I4891 Unspecified atrial fibrillation: Secondary | ICD-10-CM

## 2011-08-25 LAB — POCT INR: INR: 2

## 2011-08-30 ENCOUNTER — Ambulatory Visit (INDEPENDENT_AMBULATORY_CARE_PROVIDER_SITE_OTHER): Payer: Medicare Other

## 2011-08-30 DIAGNOSIS — Z7901 Long term (current) use of anticoagulants: Secondary | ICD-10-CM

## 2011-08-30 DIAGNOSIS — I4891 Unspecified atrial fibrillation: Secondary | ICD-10-CM

## 2011-09-27 ENCOUNTER — Ambulatory Visit (INDEPENDENT_AMBULATORY_CARE_PROVIDER_SITE_OTHER): Payer: Medicare Other

## 2011-09-27 DIAGNOSIS — I4891 Unspecified atrial fibrillation: Secondary | ICD-10-CM

## 2011-09-27 DIAGNOSIS — Z7901 Long term (current) use of anticoagulants: Secondary | ICD-10-CM

## 2011-09-27 LAB — POCT INR: INR: 2.7

## 2011-11-08 ENCOUNTER — Ambulatory Visit (INDEPENDENT_AMBULATORY_CARE_PROVIDER_SITE_OTHER): Payer: Medicare Other

## 2011-11-08 DIAGNOSIS — Z7901 Long term (current) use of anticoagulants: Secondary | ICD-10-CM

## 2011-11-08 DIAGNOSIS — I4891 Unspecified atrial fibrillation: Secondary | ICD-10-CM

## 2011-11-08 LAB — POCT INR: INR: 2.4

## 2011-11-23 ENCOUNTER — Ambulatory Visit: Payer: Medicare Other | Admitting: Cardiovascular Disease

## 2011-11-24 ENCOUNTER — Ambulatory Visit (INDEPENDENT_AMBULATORY_CARE_PROVIDER_SITE_OTHER): Payer: Medicare Other | Admitting: Cardiovascular Disease

## 2011-11-24 ENCOUNTER — Encounter: Payer: Self-pay | Admitting: Cardiovascular Disease

## 2011-11-24 VITALS — BP 120/92 | HR 75 | Ht 63.0 in | Wt 179.0 lb

## 2011-11-24 DIAGNOSIS — I4949 Other premature depolarization: Secondary | ICD-10-CM

## 2011-11-24 DIAGNOSIS — I4891 Unspecified atrial fibrillation: Secondary | ICD-10-CM

## 2011-11-24 DIAGNOSIS — E785 Hyperlipidemia, unspecified: Secondary | ICD-10-CM

## 2011-11-24 MED ORDER — PRAVASTATIN SODIUM 40 MG PO TABS: 40.0000 mg | ORAL_TABLET | Freq: Every day | ORAL | Status: DC

## 2011-11-24 NOTE — Assessment & Plan Note (Signed)
Asymptomatic from her PVCs. We discussed this with her.

## 2011-11-24 NOTE — Patient Instructions (Signed)
You are doing well. Please start pravastatin one a day  Please call us if you have new issues that need to be addressed before your next appt.  Your physician wants you to follow-up in: 6 months.  You will receive a reminder letter in the mail two months in advance. If you don't receive a letter, please call our office to schedule the follow-up appointment. 

## 2011-11-24 NOTE — Assessment & Plan Note (Signed)
Continued/chronic atrial fibrillation with rate control, on anticoagulation.

## 2011-11-24 NOTE — Progress Notes (Signed)
Patient ID: Linda [name], female    DOB: 08-24-1933, 78 y.o.   MRN: [id number]  HPI Comments: Ms. Qazi is a 78 year old woman with past medical history of chronic atrial fibrillation on warfarin, normal coronary arteries in September 2007,  previous nocturnal oximetry had showed some desaturations though she reports sleeping well and has no significant snoring or apnea, who presents for followup of her atrial fibrillation.   Overall she reports that she is doing well. She denies any significant shortness of breath. She goes to the gym 3 times a week. She uses the treadmill for 1-1/2 miles each time, uses a bike and then does weights. She has no problems and has no other complaints. She did not tolerate ACE inhibitor/lisinopril in the past as she had a cough. She reports that her blood pressure has been well-controlled at home.  Recent lab work December 2012 shows total cholesterol 224, LDL 144, HDL 63  EKG shows atrial fibrillation with ventricular rate 75 beats per minute, T-wave abnormality in leads 2, 3, aVF also V4 through V6, PVCs    Outpatient Encounter Prescriptions as of 11/24/2011  Medication Sig Dispense Refill  . calcium carbonate (OS-CAL) 600 MG TABS Take 600 mg by mouth 2 (two) times daily with a meal.        . Cholecalciferol (VITAMIN D3) 400 UNIT/ML LIQD Take 400 Units by mouth 1 dose over 24 hours.        Marland Kitchen co-enzyme Q-10 30 MG capsule Take 30 mg by mouth 3 (three) times daily.        Marland Kitchen COUMADIN 5 MG tablet Take 1 tablet (5 mg total) by mouth daily.  100 tablet  3  . diltiazem (CARDIZEM CD) 180 MG 24 hr capsule Take 1 capsule (180 mg total) by mouth daily.  90 capsule  3  . nitroGLYCERIN (NITROSTAT) 0.4 MG SL tablet Place 0.4 mg under the tongue every 5 (five) minutes as needed.        . pravastatin (PRAVACHOL) 40 MG tablet Take 1 tablet (40 mg total) by mouth daily.  30 tablet  11   Review of Systems  Constitutional: Negative.   HENT: Negative.   Eyes: Negative.     Respiratory: Negative.   Cardiovascular: Negative.   Gastrointestinal: Negative.   Musculoskeletal: Negative.   Skin: Negative.   Neurological: Negative.   Hematological: Negative.   Psychiatric/Behavioral: Negative.   All other systems reviewed and are negative.    BP 120/92  Pulse 75  Ht 5\' 3"  (1.6 m)  Wt 179 lb (81.194 kg)  BMI 31.71 kg/m2  Physical Exam  Nursing note and vitals reviewed. Constitutional: She is oriented to person, place, and time. She appears well-developed and well-nourished.  HENT:  Head: Normocephalic.  Nose: Nose normal.  Mouth/Throat: Oropharynx is clear and moist.  Eyes: Conjunctivae are normal. Pupils are equal, round, and reactive to light.  Neck: Normal range of motion. Neck supple. No JVD present.  Cardiovascular: Normal rate, S1 normal, S2 normal and intact distal pulses.  An irregularly irregular rhythm present. Exam reveals no gallop and no friction rub.   Murmur heard.  Crescendo systolic murmur is present with a grade of 2/6  Pulmonary/Chest: Effort normal and breath sounds normal. No respiratory distress. She has no wheezes. She has no rales. She exhibits no tenderness.  Abdominal: Soft. Bowel sounds are normal. She exhibits no distension. There is no tenderness.  Musculoskeletal: Normal range of motion. She exhibits no edema and no tenderness.  Lymphadenopathy:    She has no cervical adenopathy.  Neurological: She is alert and oriented to person, place, and time. Coordination normal.  Skin: Skin is warm and dry. No rash noted. No erythema.  Psychiatric: She has a normal mood and affect. Her behavior is normal. Judgment and thought content normal.         Assessment and Plan

## 2011-11-24 NOTE — Assessment & Plan Note (Signed)
She reports having a problem with Lipitor in the past. We will start pravastatin 40 mg daily.

## 2011-12-20 ENCOUNTER — Ambulatory Visit (INDEPENDENT_AMBULATORY_CARE_PROVIDER_SITE_OTHER): Payer: Medicare Other

## 2011-12-20 DIAGNOSIS — Z7901 Long term (current) use of anticoagulants: Secondary | ICD-10-CM

## 2011-12-20 DIAGNOSIS — I4891 Unspecified atrial fibrillation: Secondary | ICD-10-CM

## 2011-12-20 LAB — POCT INR: INR: 1.5

## 2012-01-03 ENCOUNTER — Ambulatory Visit (INDEPENDENT_AMBULATORY_CARE_PROVIDER_SITE_OTHER): Payer: Medicare Other

## 2012-01-03 DIAGNOSIS — I4891 Unspecified atrial fibrillation: Secondary | ICD-10-CM

## 2012-01-03 DIAGNOSIS — Z7901 Long term (current) use of anticoagulants: Secondary | ICD-10-CM

## 2012-01-03 LAB — POCT INR: INR: 2.7

## 2012-01-31 ENCOUNTER — Ambulatory Visit (INDEPENDENT_AMBULATORY_CARE_PROVIDER_SITE_OTHER): Payer: Medicare Other

## 2012-01-31 DIAGNOSIS — I4891 Unspecified atrial fibrillation: Secondary | ICD-10-CM

## 2012-01-31 DIAGNOSIS — Z7901 Long term (current) use of anticoagulants: Secondary | ICD-10-CM

## 2012-01-31 LAB — POCT INR: INR: 2.5

## 2012-02-28 ENCOUNTER — Ambulatory Visit (INDEPENDENT_AMBULATORY_CARE_PROVIDER_SITE_OTHER): Payer: Medicare Other

## 2012-02-28 DIAGNOSIS — Z7901 Long term (current) use of anticoagulants: Secondary | ICD-10-CM

## 2012-02-28 DIAGNOSIS — I4891 Unspecified atrial fibrillation: Secondary | ICD-10-CM

## 2012-04-10 ENCOUNTER — Ambulatory Visit (INDEPENDENT_AMBULATORY_CARE_PROVIDER_SITE_OTHER): Payer: Medicare Other

## 2012-04-10 DIAGNOSIS — Z7901 Long term (current) use of anticoagulants: Secondary | ICD-10-CM

## 2012-04-10 DIAGNOSIS — I4891 Unspecified atrial fibrillation: Secondary | ICD-10-CM

## 2012-05-08 ENCOUNTER — Ambulatory Visit (INDEPENDENT_AMBULATORY_CARE_PROVIDER_SITE_OTHER): Payer: Medicare Other

## 2012-05-08 DIAGNOSIS — I4891 Unspecified atrial fibrillation: Secondary | ICD-10-CM

## 2012-05-08 DIAGNOSIS — Z7901 Long term (current) use of anticoagulants: Secondary | ICD-10-CM

## 2012-05-22 ENCOUNTER — Ambulatory Visit (INDEPENDENT_AMBULATORY_CARE_PROVIDER_SITE_OTHER): Payer: Medicare Other | Admitting: Cardiovascular Disease

## 2012-05-22 ENCOUNTER — Encounter: Payer: Self-pay | Admitting: Cardiovascular Disease

## 2012-05-22 VITALS — BP 110/64 | HR 84 | Ht 63.5 in | Wt 179.0 lb

## 2012-05-22 DIAGNOSIS — R0602 Shortness of breath: Secondary | ICD-10-CM

## 2012-05-22 DIAGNOSIS — I4891 Unspecified atrial fibrillation: Secondary | ICD-10-CM

## 2012-05-22 DIAGNOSIS — E785 Hyperlipidemia, unspecified: Secondary | ICD-10-CM

## 2012-05-22 DIAGNOSIS — R609 Edema, unspecified: Secondary | ICD-10-CM

## 2012-05-22 DIAGNOSIS — I4949 Other premature depolarization: Secondary | ICD-10-CM

## 2012-05-22 DIAGNOSIS — R000 Tachycardia, unspecified: Secondary | ICD-10-CM

## 2012-05-22 MED ORDER — COUMADIN 5 MG PO TABS: 5.0000 mg | ORAL_TABLET | Freq: Every day | ORAL | Status: DC

## 2012-05-22 MED ORDER — DILTIAZEM HCL ER COATED BEADS 180 MG PO CP24: 180.0000 mg | ORAL_CAPSULE | Freq: Every day | ORAL | Status: DC

## 2012-05-22 NOTE — Assessment & Plan Note (Signed)
Asymptomatic from her PVCs.

## 2012-05-22 NOTE — Assessment & Plan Note (Signed)
Cholesterol has improved on pravastatin. We have suggested she stay on the same dose.

## 2012-05-22 NOTE — Assessment & Plan Note (Signed)
Rate is well controlled. No changes to her medications made.

## 2012-05-22 NOTE — Progress Notes (Signed)
Patient ID: Linda [name], female    DOB: 1933/10/29, 78 y.o.   MRN: [id number]  HPI Comments: Linda [name] is a 78 year old woman with past medical history of chronic atrial fibrillation on warfarin, normal coronary arteries in September 2007,  previous nocturnal oximetry had showed some desaturations though she reports sleeping well and has no significant snoring or apnea, who presents for followup of her atrial fibrillation.   Overall she reports that she is doing well. She denies any significant shortness of breath. She still goes to the gym 3 times a week. She uses the treadmill for 1-1/2 miles each time, uses a bike and then does weights. She has no problems and has no other complaints. She does have some fatigue at times on the treadmill and is walking slower. She does not want to "make her heart race".  She did not tolerate ACE inhibitor/lisinopril in the past as she had a cough. She reports that her blood pressure has been well controlled at home.  Recent lab work showing hemoglobin A1c 6.3, total cholesterol 180, LDL 97, creatinine 0.8, normal LFTs  EKG shows atrial fibrillation with ventricular rate 84 beats per minute, T-wave abnormality in leads 2, 3, aVF also V4 through V6, PVCs    Outpatient Encounter Prescriptions as of 05/22/2012  Medication Sig Dispense Refill  . aspirin 81 MG tablet Take 81 mg by mouth daily.      . calcium carbonate (OS-CAL) 600 MG TABS Take 600 mg by mouth 2 (two) times daily with a meal.        . cholecalciferol (VITAMIN D) 400 UNITS TABS Take 2,000 Units by mouth daily.      Marland Kitchen co-enzyme Q-10 30 MG capsule Take 30 mg by mouth 2 (two) times daily.       Marland Kitchen COUMADIN 5 MG tablet Take 1 tablet (5 mg total) by mouth daily.  90 tablet  3  . diltiazem (CARDIZEM CD) 180 MG 24 hr capsule Take 1 capsule (180 mg total) by mouth daily.  90 capsule  3  . nitroGLYCERIN (NITROSTAT) 0.4 MG SL tablet Place 0.4 mg under the tongue every 5 (five) minutes as needed.        .  pravastatin (PRAVACHOL) 20 MG tablet Take 20 mg by mouth daily.        Review of Systems  Constitutional: Negative.   HENT: Negative.   Eyes: Negative.   Respiratory: Negative.   Cardiovascular: Negative.   Gastrointestinal: Negative.   Musculoskeletal: Negative.   Skin: Negative.   Neurological: Negative.   Hematological: Negative.   Psychiatric/Behavioral: Negative.   All other systems reviewed and are negative.    BP 110/64  Pulse 84  Ht 5' 3.5" (1.613 m)  Wt 179 lb (81.194 kg)  BMI 31.21 kg/m2  Physical Exam  Nursing note and vitals reviewed. Constitutional: She is oriented to person, place, and time. She appears well-developed and well-nourished.  HENT:  Head: Normocephalic.  Nose: Nose normal.  Mouth/Throat: Oropharynx is clear and moist.  Eyes: Conjunctivae normal are normal. Pupils are equal, round, and reactive to light.  Neck: Normal range of motion. Neck supple. No JVD present.  Cardiovascular: Normal rate, S1 normal, S2 normal and intact distal pulses.  An irregularly irregular rhythm present. Exam reveals no gallop and no friction rub.   Murmur heard.  Crescendo systolic murmur is present with a grade of 2/6  Pulmonary/Chest: Effort normal and breath sounds normal. No respiratory distress. She has no wheezes. She has  no rales. She exhibits no tenderness.  Abdominal: Soft. Bowel sounds are normal. She exhibits no distension. There is no tenderness.  Musculoskeletal: Normal range of motion. She exhibits no edema and no tenderness.  Lymphadenopathy:    She has no cervical adenopathy.  Neurological: She is alert and oriented to person, place, and time. Coordination normal.  Skin: Skin is warm and dry. No rash noted. No erythema.  Psychiatric: She has a normal mood and affect. Her behavior is normal. Judgment and thought content normal.         Assessment and Plan

## 2012-05-22 NOTE — Assessment & Plan Note (Signed)
Minimal edema, likely venous insufficiency. Varicose veins noted

## 2012-05-22 NOTE — Patient Instructions (Addendum)
You are doing well. No medication changes were made.  Please call us if you have new issues that need to be addressed before your next appt.  Your physician wants you to follow-up in: 6 months.  You will receive a reminder letter in the mail two months in advance. If you don't receive a letter, please call our office to schedule the follow-up appointment.   

## 2012-06-03 ENCOUNTER — Encounter: Payer: Self-pay | Admitting: Cardiovascular Disease

## 2012-06-05 ENCOUNTER — Ambulatory Visit (INDEPENDENT_AMBULATORY_CARE_PROVIDER_SITE_OTHER): Payer: Medicare Other

## 2012-06-05 DIAGNOSIS — I4891 Unspecified atrial fibrillation: Secondary | ICD-10-CM

## 2012-06-05 DIAGNOSIS — Z7901 Long term (current) use of anticoagulants: Secondary | ICD-10-CM

## 2012-07-10 ENCOUNTER — Ambulatory Visit (INDEPENDENT_AMBULATORY_CARE_PROVIDER_SITE_OTHER): Payer: Medicare Other

## 2012-07-10 DIAGNOSIS — Z7901 Long term (current) use of anticoagulants: Secondary | ICD-10-CM

## 2012-07-10 DIAGNOSIS — I4891 Unspecified atrial fibrillation: Secondary | ICD-10-CM

## 2012-07-23 ENCOUNTER — Ambulatory Visit: Payer: Self-pay | Admitting: Internal Medicine

## 2012-08-03 ENCOUNTER — Other Ambulatory Visit: Payer: Self-pay

## 2012-08-03 ENCOUNTER — Encounter: Payer: Self-pay | Admitting: Cardiovascular Disease

## 2012-08-14 ENCOUNTER — Ambulatory Visit (INDEPENDENT_AMBULATORY_CARE_PROVIDER_SITE_OTHER): Payer: Medicare Other

## 2012-08-14 DIAGNOSIS — Z7901 Long term (current) use of anticoagulants: Secondary | ICD-10-CM

## 2012-08-14 DIAGNOSIS — I4891 Unspecified atrial fibrillation: Secondary | ICD-10-CM

## 2012-09-25 ENCOUNTER — Ambulatory Visit (INDEPENDENT_AMBULATORY_CARE_PROVIDER_SITE_OTHER): Payer: Medicare Other

## 2012-09-25 DIAGNOSIS — Z7901 Long term (current) use of anticoagulants: Secondary | ICD-10-CM

## 2012-09-25 DIAGNOSIS — I4891 Unspecified atrial fibrillation: Secondary | ICD-10-CM

## 2012-09-29 ENCOUNTER — Encounter: Payer: Self-pay | Admitting: Cardiovascular Disease

## 2012-11-06 ENCOUNTER — Ambulatory Visit (INDEPENDENT_AMBULATORY_CARE_PROVIDER_SITE_OTHER): Payer: Medicare Other

## 2012-11-06 DIAGNOSIS — I4891 Unspecified atrial fibrillation: Secondary | ICD-10-CM

## 2012-11-06 DIAGNOSIS — Z7901 Long term (current) use of anticoagulants: Secondary | ICD-10-CM

## 2012-11-06 LAB — POCT INR: INR: 1.9

## 2012-11-19 ENCOUNTER — Ambulatory Visit (INDEPENDENT_AMBULATORY_CARE_PROVIDER_SITE_OTHER): Payer: Medicare Other | Admitting: Cardiovascular Disease

## 2012-11-19 ENCOUNTER — Encounter: Payer: Self-pay | Admitting: Cardiovascular Disease

## 2012-11-19 VITALS — BP 120/60 | HR 65 | Ht 63.0 in | Wt 181.0 lb

## 2012-11-19 DIAGNOSIS — Z7901 Long term (current) use of anticoagulants: Secondary | ICD-10-CM

## 2012-11-19 DIAGNOSIS — I4891 Unspecified atrial fibrillation: Secondary | ICD-10-CM

## 2012-11-19 DIAGNOSIS — E785 Hyperlipidemia, unspecified: Secondary | ICD-10-CM

## 2012-11-19 NOTE — Patient Instructions (Addendum)
You are doing well. No medication changes were made.  Call the office if you would like to try diltiazem 120 mg once a day (down from 180 mg once a day)  Please call us if you have new issues that need to be addressed before your next appt.  Your physician wants you to follow-up in: 6 months.  You will receive a reminder letter in the mail two months in advance. If you don't receive a letter, please call our office to schedule the follow-up appointment.

## 2012-11-19 NOTE — Progress Notes (Signed)
Patient ID: Linda [name], female    DOB: October 15, 1933, 79 y.o.   MRN: [id number]  HPI Comments: Linda [name] is a 79 year old woman with past medical history of chronic atrial fibrillation on warfarin, normal coronary arteries in September 2007,  previous nocturnal oximetry had showed some desaturations though she reports sleeping well and has no significant snoring or apnea, who presents for followup of her atrial fibrillation.   Overall she reports that she is doing well. She denies any significant shortness of breath. She still goes to the gym 3 times a week. She uses the treadmill for 1-1/2 miles each time, uses a bike and then does weights. She has no problems and has no other complaints.   She continues to have mild fatigue. She reports that her blood pressure has been well controlled at home. Lab work shows total cholesterol 174, LDL 89, HDL 68, hemoglobin A1c 6.3, creatinine 0.8  Recent lab work showing hemoglobin A1c 6.3, total cholesterol 180, LDL 97, creatinine 0.8, normal LFTs  EKG shows atrial fibrillation with ventricular rate 65 beats per minute, T-wave abnormality in leads 2, 3, aVF also V4 through V6    Outpatient Encounter Prescriptions as of 11/19/2012  Medication Sig Dispense Refill  . calcium carbonate (OS-CAL) 600 MG TABS Take 600 mg by mouth 2 (two) times daily with a meal.        . cholecalciferol (VITAMIN D) 400 UNITS TABS Take 2,000 Units by mouth daily.      Marland Kitchen co-enzyme Q-10 30 MG capsule Take 30 mg by mouth 2 (two) times daily.       Marland Kitchen COUMADIN 5 MG tablet Take 1 tablet (5 mg total) by mouth daily.  90 tablet  3  . diltiazem (CARDIZEM CD) 180 MG 24 hr capsule Take 1 capsule (180 mg total) by mouth daily.  90 capsule  3  . nitroGLYCERIN (NITROSTAT) 0.4 MG SL tablet Place 0.4 mg under the tongue every 5 (five) minutes as needed.        . pravastatin (PRAVACHOL) 20 MG tablet Take 20 mg by mouth daily.      . [DISCONTINUED] aspirin 81 MG tablet Take 81 mg by mouth daily.        No facility-administered encounter medications on file as of 11/19/2012.     Review of Systems  Constitutional: Negative.   HENT: Negative.   Eyes: Negative.   Respiratory: Negative.   Cardiovascular: Negative.   Gastrointestinal: Negative.   Musculoskeletal: Negative.   Skin: Negative.   Neurological: Negative.   Psychiatric/Behavioral: Negative.   All other systems reviewed and are negative.    BP 120/60  Pulse 65  Ht 5\' 3"  (1.6 m)  Wt 181 lb (82.101 kg)  BMI 32.07 kg/m2  Physical Exam  Nursing note and vitals reviewed. Constitutional: She is oriented to person, place, and time. She appears well-developed and well-nourished.  HENT:  Head: Normocephalic.  Nose: Nose normal.  Mouth/Throat: Oropharynx is clear and moist.  Eyes: Conjunctivae are normal. Pupils are equal, round, and reactive to light.  Neck: Normal range of motion. Neck supple. No JVD present.  Cardiovascular: Normal rate, S1 normal, S2 normal and intact distal pulses.  An irregularly irregular rhythm present. Exam reveals no gallop and no friction rub.   Murmur heard.  Crescendo systolic murmur is present with a grade of 2/6  Pulmonary/Chest: Effort normal and breath sounds normal. No respiratory distress. She has no wheezes. She has no rales. She exhibits no tenderness.  Abdominal:  Soft. Bowel sounds are normal. She exhibits no distension. There is no tenderness.  Musculoskeletal: Normal range of motion. She exhibits no edema and no tenderness.  Lymphadenopathy:    She has no cervical adenopathy.  Neurological: She is alert and oriented to person, place, and time. Coordination normal.  Skin: Skin is warm and dry. No rash noted. No erythema.  Psychiatric: She has a normal mood and affect. Her behavior is normal. Judgment and thought content normal.    Assessment and Plan

## 2012-11-19 NOTE — Assessment & Plan Note (Signed)
No recent arrhythmia.

## 2012-11-19 NOTE — Assessment & Plan Note (Signed)
Cholesterol is at goal on the current lipid regimen. No changes to the medications were made.  

## 2012-11-19 NOTE — Assessment & Plan Note (Signed)
Doing well on her warfarin with no complications

## 2012-11-23 ENCOUNTER — Encounter: Payer: Self-pay | Admitting: Cardiovascular Disease

## 2012-11-25 ENCOUNTER — Encounter: Payer: Self-pay | Admitting: *Deleted

## 2012-11-26 ENCOUNTER — Encounter: Payer: Self-pay | Admitting: *Deleted

## 2012-12-11 ENCOUNTER — Ambulatory Visit (INDEPENDENT_AMBULATORY_CARE_PROVIDER_SITE_OTHER): Payer: Medicare Other

## 2012-12-11 DIAGNOSIS — I4891 Unspecified atrial fibrillation: Secondary | ICD-10-CM

## 2012-12-11 DIAGNOSIS — Z7901 Long term (current) use of anticoagulants: Secondary | ICD-10-CM

## 2013-01-21 ENCOUNTER — Ambulatory Visit (INDEPENDENT_AMBULATORY_CARE_PROVIDER_SITE_OTHER): Payer: Medicare Other | Admitting: Cardiovascular Disease

## 2013-01-21 ENCOUNTER — Telehealth: Payer: Self-pay

## 2013-01-21 ENCOUNTER — Encounter: Payer: Self-pay | Admitting: Cardiovascular Disease

## 2013-01-21 VITALS — BP 110/84 | HR 62 | Ht 63.0 in | Wt 180.2 lb

## 2013-01-21 DIAGNOSIS — R0602 Shortness of breath: Secondary | ICD-10-CM

## 2013-01-21 DIAGNOSIS — I4891 Unspecified atrial fibrillation: Secondary | ICD-10-CM

## 2013-01-21 DIAGNOSIS — R6884 Jaw pain: Secondary | ICD-10-CM

## 2013-01-21 DIAGNOSIS — E785 Hyperlipidemia, unspecified: Secondary | ICD-10-CM

## 2013-01-21 DIAGNOSIS — Z7901 Long term (current) use of anticoagulants: Secondary | ICD-10-CM

## 2013-01-21 LAB — POCT INR: INR: 2.5

## 2013-01-21 MED ORDER — DILTIAZEM HCL 30 MG PO TABS: 30.0000 mg | ORAL_TABLET | Freq: Four times a day (QID) | ORAL | Status: DC

## 2013-01-21 MED ORDER — NITROGLYCERIN 0.4 MG SL SUBL: 0.4000 mg | SUBLINGUAL_TABLET | SUBLINGUAL | Status: DC | PRN

## 2013-01-21 NOTE — Patient Instructions (Addendum)
Please hold the diltiazem 180 mg pills Start the diltiazem 30 mg pills three times a day If you have fast heart crate, take this four times a day  If you tolerate diltiazem 4 times a day with no side effects, Call the office for a 120 mg pill once a day  Please call us if you have new issues that need to be addressed before your next appt.  Your physician wants you to follow-up in: 6 months.  You will receive a reminder letter in the mail two months in advance. If you don't receive a letter, please call our office to schedule the follow-up appointment.

## 2013-01-21 NOTE — Progress Notes (Signed)
Patient ID: Linda [name], female    DOB: 1933/08/04, 79 y.o.   MRN: [id number]  HPI Comments: Linda [name] is a 79 year old woman with past medical history of chronic atrial fibrillation on warfarin, normal coronary arteries in September 2007,  previous nocturnal oximetry had showed some desaturations though she reports sleeping well and has no significant snoring or apnea, who presents for followup of her atrial fibrillation.   She reports that she has had weakness in the mornings. Recently has been checking her blood pressure and reports having systolic pressures sometimes in the 90s and 80 range. Wonders if it could be her diltiazem 180 mg daily. She has been taking this pill at nighttime. Previously she was taking this medication at 3 PM, yesterday took this at midnight. She did not feel well this morning. Pressures were low. Heart rates have been running in the low 50s.  Typically she is very active with no complaints  She continues to have mild fatigue. She reports that her blood pressure has been well controlled at home. Lab work shows total cholesterol 174, LDL 89, HDL 68, hemoglobin A1c 6.3, creatinine 0.8  Recent lab work showing hemoglobin A1c 6.3, total cholesterol 180, LDL 97, creatinine 0.8, normal LFTs  EKG shows atrial fibrillation with ventricular rate 62 beats per minute, T-wave abnormality in leads 2, 3, aVF also V4 through V6    Outpatient Encounter Prescriptions as of 01/21/2013  Medication Sig Dispense Refill  . calcium carbonate (OS-CAL) 600 MG TABS Take 600 mg by mouth 2 (two) times daily with a meal.        . cholecalciferol (VITAMIN D) 400 UNITS TABS Take 2,000 Units by mouth daily.      Marland Kitchen co-enzyme Q-10 30 MG capsule Take 30 mg by mouth 2 (two) times daily.       Marland Kitchen COUMADIN 5 MG tablet Take 1 tablet (5 mg total) by mouth daily.  90 tablet  3  . diltiazem (CARDIZEM CD) 180 MG 24 hr capsule Take 1 capsule (180 mg total) by mouth daily.  90 capsule  3  . nitroGLYCERIN  (NITROSTAT) 0.4 MG SL tablet Place 1 tablet (0.4 mg total) under the tongue every 5 (five) minutes as needed.  25 tablet  1  . pravastatin (PRAVACHOL) 20 MG tablet Take 20 mg by mouth daily.       No facility-administered encounter medications on file as of 01/21/2013.     Review of Systems  Constitutional: Positive for fatigue.  HENT: Negative.   Eyes: Negative.   Respiratory: Negative.   Cardiovascular: Negative.   Gastrointestinal: Negative.   Musculoskeletal: Negative.   Skin: Negative.   Neurological: Positive for dizziness and weakness.  Psychiatric/Behavioral: Negative.   All other systems reviewed and are negative.    BP 110/84  Pulse 62  Ht 5\' 3"  (1.6 m)  Wt 180 lb 4 oz (81.761 kg)  BMI 31.94 kg/m2  Physical Exam  Nursing note and vitals reviewed. Constitutional: She is oriented to person, place, and time. She appears well-developed and well-nourished.  HENT:  Head: Normocephalic.  Nose: Nose normal.  Mouth/Throat: Oropharynx is clear and moist.  Eyes: Conjunctivae are normal. Pupils are equal, round, and reactive to light.  Neck: Normal range of motion. Neck supple. No JVD present.  Cardiovascular: Normal rate, S1 normal, S2 normal and intact distal pulses.  An irregularly irregular rhythm present. Exam reveals no gallop and no friction rub.   Murmur heard.  Crescendo systolic murmur is present with  a grade of 2/6  Pulmonary/Chest: Effort normal and breath sounds normal. No respiratory distress. She has no wheezes. She has no rales. She exhibits no tenderness.  Abdominal: Soft. Bowel sounds are normal. She exhibits no distension. There is no tenderness.  Musculoskeletal: Normal range of motion. She exhibits no edema and no tenderness.  Lymphadenopathy:    She has no cervical adenopathy.  Neurological: She is alert and oriented to person, place, and time. Coordination normal.  Skin: Skin is warm and dry. No rash noted. No erythema.  Psychiatric: She has a normal  mood and affect. Her behavior is normal. Judgment and thought content normal.    Assessment and Plan

## 2013-01-21 NOTE — Telephone Encounter (Signed)
Pt states pt bp is 84/49/ HR 59 when she got up, checked it later, 89/"something", drank some caffeine, took a baby aspirin,  jaw started hurting, felt weak, layed down,  Felt some better, states jaw is starting to hurt again. Please call

## 2013-01-21 NOTE — Assessment & Plan Note (Signed)
Encouraged her to stay on her pravastatin 

## 2013-01-21 NOTE — Telephone Encounter (Signed)
Spoke with pt, concerned having arm pain x a couple days but today started to experience jaw pain as well as low BP.  Pt took 81mg  ASA with some relief.  Pt has expired rx of NTG will renew rx at [facility].  Pt aware.  Made OV for today 01/21/13 at 3pm. Advised pt to continue to monitor symptoms if develops CP or symptoms worsen go to ED.  Pt verbalized understanding.

## 2013-01-21 NOTE — Assessment & Plan Note (Signed)
Heart rate seems to be running low at home with rates in the low 50s by her records also with low blood pressure. We'll decrease her diltiazem down to 30 mg 3 times a day, increasing to 4 times a day if tolerated. If she tolerates 4 times a day dosing, could change back to diltiazem 120 mg CD dosing daily.

## 2013-01-21 NOTE — Assessment & Plan Note (Signed)
No complications on warfarin. Managed by our Coumadin clinic

## 2013-01-23 ENCOUNTER — Encounter: Payer: Self-pay | Admitting: Cardiovascular Disease

## 2013-02-07 ENCOUNTER — Other Ambulatory Visit: Payer: Self-pay

## 2013-02-07 MED ORDER — DILTIAZEM HCL ER COATED BEADS 120 MG PO CP24: 120.0000 mg | ORAL_CAPSULE | Freq: Every day | ORAL | Status: DC

## 2013-02-07 NOTE — Telephone Encounter (Signed)
PLEASE SEE NOTE BELOW  

## 2013-02-07 NOTE — Telephone Encounter (Signed)
Pt states she has a question about her Cardizem, states her BP is lower than she likes. Wonders "what kind of problem is going on with her heart to cause all of this" . Please advise

## 2013-02-07 NOTE — Telephone Encounter (Signed)
Returned call to pt advised there is no specific identifiable change in her heart condition that is causing her to require less Cardizem medication.  Advised pt the need to titrate BP medications up and down is common.  Pt verbalized understanding.   Per Dr Windell Hummingbird last OV note on 01/21/13: Heart rate seems to be running low at home with rates in the low 50s by her records also with low blood pressure. We'll decrease her diltiazem down to 30 mg 3 times a day, increasing to 4 times a day if tolerated. If she tolerates 4 times a day dosing, could change back to diltiazem 120 mg CD dosing daily.   Pt is requesting rx to [facility] for Diltiazem 120mg  CD tablet 90 day supply. Rx sent to pharmacy.  Pt aware.

## 2013-02-07 NOTE — Telephone Encounter (Signed)
Needs 90 day suppley. 120

## 2013-03-05 ENCOUNTER — Ambulatory Visit (INDEPENDENT_AMBULATORY_CARE_PROVIDER_SITE_OTHER): Payer: Medicare Other

## 2013-03-05 DIAGNOSIS — I4891 Unspecified atrial fibrillation: Secondary | ICD-10-CM

## 2013-03-05 DIAGNOSIS — Z7901 Long term (current) use of anticoagulants: Secondary | ICD-10-CM

## 2013-03-12 ENCOUNTER — Ambulatory Visit: Payer: Self-pay

## 2013-03-20 ENCOUNTER — Ambulatory Visit (INDEPENDENT_AMBULATORY_CARE_PROVIDER_SITE_OTHER): Payer: Medicare Other | Admitting: Adult Health

## 2013-03-20 ENCOUNTER — Encounter: Payer: Self-pay | Admitting: Adult Health

## 2013-03-20 VITALS — BP 120/70 | HR 76 | Temp 97.9°F | Resp 12 | Ht 63.5 in | Wt 179.5 lb

## 2013-03-20 DIAGNOSIS — M25519 Pain in unspecified shoulder: Secondary | ICD-10-CM

## 2013-03-20 DIAGNOSIS — M25512 Pain in left shoulder: Secondary | ICD-10-CM

## 2013-03-20 DIAGNOSIS — R490 Dysphonia: Secondary | ICD-10-CM

## 2013-03-20 NOTE — Patient Instructions (Addendum)
   Thank you for choosing [place] at [facility] for your health care needs.  Please go to our [facility] office for the xray of your left shoulder  The results will be available through MyChart for your convenience.   For your shoulder pain:   Ice the area 4-5 times a day for 15-20 minutes at a time   Immobilize the area with the use of a sling   Tylenol for pain and discomfort  Schedule appointment with Dr. Jenne Campus. Have him check you for your hoarseness.  Follow up with me in 3 months for cholesterol check

## 2013-03-20 NOTE — Progress Notes (Signed)
Subjective:    Patient ID: Linda [name], female    DOB: [birth year], 79 y.o.   MRN: [id number]  HPI  Pt is a pleasant 79 yo female, presents to clinic to establish care.  Pt presents with multiple concerns.  1. Left Shoulder pain x 6 weeks. Pt reports torn rotator cuff with repair 12 years ago.  Pt states she goes to the gym 3 times a week and has also moved furniture recently, although pt denies any recollection of an acute injury.  Pt states pain is worse with certain movements.  2. Hoarseness of voice, worse in the morning. Pt states she has had to have a parathyroid removed 10 years ago and is concerned about a recurrent parathyroid problem.  Pt also reports history of acid reflux.  Pt sees Dr. Beckey Rutter and has been told in the past that she may have esophagitis.      Past Medical History  Diagnosis Date  . Arrhythmia     paroxysmal atrial fibrillation  . PVC (premature ventricular contraction)   . Diverticulitis   . Hyperlipidemia   . Kidney stone   . Colon polyp   . Parathyroid disease     Parathyroidectomy   . UTI (lower urinary tract infection)      Past Surgical History  Procedure Laterality Date  . Tonsillectomy  1956  . Vein ligation and stripping    . Cholecystectomy  2002  . Appendectomy  1984  . Total abdominal hysterectomy w/ bilateral salpingoophorectomy  1984  . Rotator cuff repair  2002  . Parathyroidectomy  2004    1 removed     Family History  Problem Relation Age of Onset  . Heart disease Brother      History   Social History  . Marital Status: Single    Spouse Name: Billy    Number of Children: 2  . Years of Education: 12   Occupational History  . Accounting     Retired   Social History Main Topics  . Smoking status: Never Smoker   . Smokeless tobacco: Never Used  . Alcohol Use: No  . Drug Use: No  . Sexual Activity: Not on file   Other Topics Concern  . Not on file   Social History Narrative   Linda [name] is from [facility]  and grew up [place] farm. She is married to Angie, her husband of 59 years. They have a daughter and a son. She enjoys gardening, traveling with her husband.      Review of Systems  Constitutional: Negative.  Negative for activity change, appetite change, fatigue and unexpected weight change.  HENT: Positive for hearing loss and voice change. Negative for neck pain.        Reports decreased hearing 2/2 cerumen build up, sees ENT to have ears irrigated regularly. Hoarseness of voice, worse in the mornings.  Eyes: Negative.   Respiratory: Negative.  Negative for cough, chest tightness and shortness of breath.   Cardiovascular: Negative.  Negative for chest pain and palpitations.  Gastrointestinal: Negative.   Endocrine: Negative.  Negative for polydipsia, polyphagia and polyuria.  Genitourinary: Negative.   Musculoskeletal: Positive for arthralgias.       Left shoulder pain x 6 weeks  Skin: Negative.   Allergic/Immunologic: Negative.   Neurological: Negative.  Negative for dizziness, light-headedness and headaches.  Psychiatric/Behavioral: Negative.        Objective:   Physical Exam  Constitutional: She is oriented to person, place, and time. She appears  well-developed and well-nourished.  HENT:  Head: Normocephalic and atraumatic.  Right Ear: External ear normal.  Left Ear: External ear normal.  Nose: Nose normal.  Mouth/Throat: Oropharynx is clear and moist and mucous membranes are normal.  Cerumen impaction  Eyes: Conjunctivae and EOM are normal. Pupils are equal, round, and reactive to light.  Neck: Normal range of motion. Neck supple. No thyromegaly present.  Cardiovascular: Normal rate, normal heart sounds and intact distal pulses.  A regularly irregular rhythm present.  Pulmonary/Chest: Effort normal and breath sounds normal. No respiratory distress. She has no wheezes.  Abdominal: Soft. Bowel sounds are normal. There is no tenderness.  Musculoskeletal: Normal range of  motion.       Left shoulder: She exhibits pain.  Pain in left shoulder, worse with movement, denies pain with palpation.  ROM in left shoulder limited to 90 degrees with abduction and forward extension d/t pain.  Neurological: She is alert and oriented to person, place, and time. She has normal strength and normal reflexes. No sensory deficit. She displays a negative Romberg sign.  Skin: Skin is warm, dry and intact.  Psychiatric: She has a normal mood and affect. Her behavior is normal. Judgment and thought content normal.    BP 120/70  Pulse 76  Temp(Src) 97.9 F (36.6 C) (Oral)  Resp 12  Ht 5' 3.5" (1.613 m)  Wt 179 lb 8 oz (81.421 kg)  BMI 31.29 kg/m2  SpO2 96%       Assessment & Plan:

## 2013-03-21 ENCOUNTER — Ambulatory Visit (INDEPENDENT_AMBULATORY_CARE_PROVIDER_SITE_OTHER)
Admission: RE | Admit: 2013-03-21 | Discharge: 2013-03-21 | Disposition: A | Discharge status: Home / Self Care | Payer: Medicare Other | Source: Ambulatory Visit | Attending: Adult Health | Admitting: Adult Health

## 2013-03-21 DIAGNOSIS — R490 Dysphonia: Secondary | ICD-10-CM | POA: Insufficient documentation

## 2013-03-21 DIAGNOSIS — M25512 Pain in left shoulder: Secondary | ICD-10-CM | POA: Insufficient documentation

## 2013-03-21 DIAGNOSIS — M25519 Pain in unspecified shoulder: Secondary | ICD-10-CM

## 2013-03-21 NOTE — Assessment & Plan Note (Signed)
This has been an ongoing problem for patient. She has an appointment with ENT in approximately 2 weeks.

## 2013-03-21 NOTE — Assessment & Plan Note (Addendum)
Started 6 weeks ago. Pain with abduction of arm at 90. Decreased range of motion. Plain x-ray to rule out any bony abnormality or dislocation. Apply ice to the area 3-4 times a day for 15 minutes. Tylenol for pain. Use a sling to immobilize the arm. It x-ray normal and symptoms do not improve within 3-4 weeks consider MRI

## 2013-04-02 ENCOUNTER — Ambulatory Visit (INDEPENDENT_AMBULATORY_CARE_PROVIDER_SITE_OTHER): Payer: Medicare Other | Admitting: General Practice

## 2013-04-02 DIAGNOSIS — Z7901 Long term (current) use of anticoagulants: Secondary | ICD-10-CM

## 2013-04-02 DIAGNOSIS — I4891 Unspecified atrial fibrillation: Secondary | ICD-10-CM

## 2013-04-23 ENCOUNTER — Ambulatory Visit (INDEPENDENT_AMBULATORY_CARE_PROVIDER_SITE_OTHER): Payer: Medicare Other | Admitting: General Practice

## 2013-04-23 DIAGNOSIS — I4891 Unspecified atrial fibrillation: Secondary | ICD-10-CM

## 2013-04-23 DIAGNOSIS — Z7901 Long term (current) use of anticoagulants: Secondary | ICD-10-CM

## 2013-04-23 LAB — POCT INR: INR: 2.5

## 2013-05-05 ENCOUNTER — Other Ambulatory Visit: Payer: Self-pay | Admitting: Cardiovascular Disease

## 2013-05-05 NOTE — Telephone Encounter (Signed)
Please review and refill, Thank You. 

## 2013-05-20 ENCOUNTER — Ambulatory Visit (INDEPENDENT_AMBULATORY_CARE_PROVIDER_SITE_OTHER): Payer: Medicare Other | Admitting: Cardiovascular Disease

## 2013-05-20 ENCOUNTER — Ambulatory Visit (INDEPENDENT_AMBULATORY_CARE_PROVIDER_SITE_OTHER): Payer: Medicare Other | Admitting: General Practice

## 2013-05-20 ENCOUNTER — Encounter: Payer: Self-pay | Admitting: Cardiovascular Disease

## 2013-05-20 VITALS — BP 122/78 | HR 84 | Ht 63.0 in | Wt 179.5 lb

## 2013-05-20 DIAGNOSIS — Z7901 Long term (current) use of anticoagulants: Secondary | ICD-10-CM

## 2013-05-20 DIAGNOSIS — I4949 Other premature depolarization: Secondary | ICD-10-CM

## 2013-05-20 DIAGNOSIS — I4891 Unspecified atrial fibrillation: Secondary | ICD-10-CM

## 2013-05-20 DIAGNOSIS — E785 Hyperlipidemia, unspecified: Secondary | ICD-10-CM

## 2013-05-20 NOTE — Patient Instructions (Signed)
You are doing well. No medication changes were made.  Please call us if you have new issues that need to be addressed before your next appt.  Your physician wants you to follow-up in: 6 months.  You will receive a reminder letter in the mail two months in advance. If you don't receive a letter, please call our office to schedule the follow-up appointment.   

## 2013-05-20 NOTE — Assessment & Plan Note (Signed)
Asymptomatic from her PVCs. We will monitor this closely for symptoms. If ectopy persists, could do a Holter to determine frequency of PVCs in a 24-hour period

## 2013-05-20 NOTE — Assessment & Plan Note (Signed)
Heart rate well controlled on today's visit. PVCs noted. No changes to her medication

## 2013-05-20 NOTE — Progress Notes (Signed)
Patient ID: Linda [name], female    DOB: 01/14/1934, 79 y.o.   MRN: [id number]  HPI Comments: Linda [name] is a 79 year old woman with past medical history of chronic atrial fibrillation on warfarin, normal coronary arteries in September 2007,  previous nocturnal oximetry had showed some desaturations though she reports sleeping well and has no significant snoring or apnea, who presents for followup of her atrial fibrillation.  On her last clinic visit, she was having orthostatic hypotension. Diltiazem decreased down to 30 mg 3 times a day. Blood pressure ran high and she was changed to diltiazem 120 mg daily. On the 120 mg dosing, she reports blood pressure has been well-controlled. Heart rate sometimes running low at home. Otherwise she is active with no complaints. No symptoms of lightheadedness or dizziness. She uses and old blood pressure cuff to measure her pulse rate  She continues to have mild fatigue. She reports that her blood pressure has been well controlled at home. Lab work shows total cholesterol 174, LDL 89, HDL 68, hemoglobin A1c 6.3, creatinine 0.8  Recent lab work showing hemoglobin A1c 6.3, total cholesterol 180, LDL 97, creatinine 0.8, normal LFTs  EKG shows atrial fibrillation with ventricular rate 84 beats per minute, T-wave abnormality in leads 2, 3, aVF also V4 through V6, PVCs noted    Outpatient Encounter Prescriptions as of 05/20/2013  Medication Sig  . calcium carbonate (OS-CAL) 600 MG TABS Take 600 mg by mouth 2 (two) times daily with a meal.    . cholecalciferol (VITAMIN D) 400 UNITS TABS Take 2,000 Units by mouth daily.  Marland Kitchen co-enzyme Q-10 30 MG capsule Take 30 mg by mouth 2 (two) times daily.   Marland Kitchen COUMADIN 5 MG tablet TAKE 1 TABLET (5 MG TOTAL) DAILY AS DIRECTED BY ANTI  COAG CLINIC  . diltiazem (CARDIZEM CD) 120 MG 24 hr capsule Take 1 capsule (120 mg total) by mouth daily.  . nitroGLYCERIN (NITROSTAT) 0.4 MG SL tablet Place 1 tablet (0.4 mg total) under the tongue  every 5 (five) minutes as needed.  Marland Kitchen omeprazole (PRILOSEC) 20 MG capsule Take 20 mg by mouth daily.  . pravastatin (PRAVACHOL) 20 MG tablet Take 20 mg by mouth daily.     Review of Systems  HENT: Negative.   Eyes: Negative.   Respiratory: Negative.   Cardiovascular: Negative.   Gastrointestinal: Negative.   Endocrine: Negative.   Musculoskeletal: Negative.   Skin: Negative.   Allergic/Immunologic: Negative.   Hematological: Negative.   Psychiatric/Behavioral: Negative.   All other systems reviewed and are negative.    Ht 5\' 3"  (1.6 m)  Wt 179 lb 8 oz (81.421 kg)  BMI 31.81 kg/m2  Physical Exam  Nursing note and vitals reviewed. Constitutional: She is oriented to person, place, and time. She appears well-developed and well-nourished.  HENT:  Head: Normocephalic.  Nose: Nose normal.  Mouth/Throat: Oropharynx is clear and moist.  Eyes: Conjunctivae are normal. Pupils are equal, round, and reactive to light.  Neck: Normal range of motion. Neck supple. No JVD present.  Cardiovascular: Normal rate, S1 normal, S2 normal and intact distal pulses.  An irregularly irregular rhythm present. Exam reveals no gallop and no friction rub.   Murmur heard.  Crescendo systolic murmur is present with a grade of 2/6  Pulmonary/Chest: Effort normal and breath sounds normal. No respiratory distress. She has no wheezes. She has no rales. She exhibits no tenderness.  Abdominal: Soft. Bowel sounds are normal. She exhibits no distension. There is no tenderness.  Musculoskeletal: Normal range of motion. She exhibits no edema and no tenderness.  Lymphadenopathy:    She has no cervical adenopathy.  Neurological: She is alert and oriented to person, place, and time. Coordination normal.  Skin: Skin is warm and dry. No rash noted. No erythema.  Psychiatric: She has a normal mood and affect. Her behavior is normal. Judgment and thought content normal.    Assessment and Plan

## 2013-05-20 NOTE — Assessment & Plan Note (Signed)
Suggested she continue on her pravastatin 

## 2013-06-09 ENCOUNTER — Telehealth: Payer: Self-pay

## 2013-06-09 NOTE — Telephone Encounter (Signed)
Pt called and states her BP (bottom#) has been "creeping up" over the last few weeks, states her husband has been in the hospital the last several week. Please call.

## 2013-06-09 NOTE — Telephone Encounter (Signed)
She could take extra diltiazem 30 mg pill prn for high bp In addition to her 120 mg pill daily

## 2013-06-09 NOTE — Telephone Encounter (Signed)
Patient states she has been under extreme stress since her husband has been in the hospital. Her bp has been averaging 130's/90-100. She is concerned that her SBP is to high. She is bringing her husband home today and is hopeful that it comes down. Patient advised to try and reduce stress level and to call back is sbp stays over 100.

## 2013-06-10 NOTE — Telephone Encounter (Signed)
Spoke w/ pt.  She is agreeable to plan. Reports that she has been taking the extra diltiazem 30mg  pill when needed. States that her BP has come down considerably since her husband was released from [facility] and they were able to get some rest.  She will call with any other questions or concerns.

## 2013-06-18 ENCOUNTER — Ambulatory Visit (INDEPENDENT_AMBULATORY_CARE_PROVIDER_SITE_OTHER): Payer: Medicare Other

## 2013-06-18 DIAGNOSIS — I4891 Unspecified atrial fibrillation: Secondary | ICD-10-CM

## 2013-06-18 DIAGNOSIS — Z7901 Long term (current) use of anticoagulants: Secondary | ICD-10-CM

## 2013-07-01 ENCOUNTER — Encounter: Payer: Self-pay | Admitting: Adult Health

## 2013-07-01 ENCOUNTER — Ambulatory Visit (INDEPENDENT_AMBULATORY_CARE_PROVIDER_SITE_OTHER): Payer: Medicare Other | Admitting: Adult Health

## 2013-07-01 ENCOUNTER — Other Ambulatory Visit: Payer: Self-pay | Admitting: *Deleted

## 2013-07-01 VITALS — BP 110/70 | HR 42 | Temp 97.6°F | Resp 12 | Ht 63.0 in | Wt 176.0 lb

## 2013-07-01 DIAGNOSIS — Z1239 Encounter for other screening for malignant neoplasm of breast: Secondary | ICD-10-CM

## 2013-07-01 DIAGNOSIS — K5732 Diverticulitis of large intestine without perforation or abscess without bleeding: Secondary | ICD-10-CM | POA: Insufficient documentation

## 2013-07-01 DIAGNOSIS — M25512 Pain in left shoulder: Secondary | ICD-10-CM

## 2013-07-01 DIAGNOSIS — R490 Dysphonia: Secondary | ICD-10-CM

## 2013-07-01 DIAGNOSIS — M25519 Pain in unspecified shoulder: Secondary | ICD-10-CM

## 2013-07-01 DIAGNOSIS — E209 Hypoparathyroidism, unspecified: Secondary | ICD-10-CM

## 2013-07-01 DIAGNOSIS — R1032 Left lower quadrant pain: Secondary | ICD-10-CM

## 2013-07-01 LAB — CALCIUM: Calcium: 9.2 mg/dL (ref 8.4–10.5)

## 2013-07-01 LAB — MAGNESIUM: Magnesium: 1.9 mg/dL (ref 1.5–2.5)

## 2013-07-01 MED ORDER — METRONIDAZOLE 500 MG PO TABS: 500.0000 mg | ORAL_TABLET | Freq: Two times a day (BID) | ORAL | Status: DC

## 2013-07-01 MED ORDER — OMEPRAZOLE 20 MG PO CPDR: 20.0000 mg | DELAYED_RELEASE_CAPSULE | Freq: Every day | ORAL | Status: DC

## 2013-07-01 MED ORDER — CIPROFLOXACIN HCL 500 MG PO TABS: 500.0000 mg | ORAL_TABLET | Freq: Two times a day (BID) | ORAL | Status: DC

## 2013-07-01 NOTE — Patient Instructions (Signed)
  Start Cipro 500 mg twice a day for 10 days and Flagyl 500 mg twice a day for 10 days.  Clear liquid diet for 24 - 48 hours then resume a bland diet, easy to digest.  Return to clinic on Thursday for PT/INR check

## 2013-07-01 NOTE — Assessment & Plan Note (Signed)
Resolved. RTC if symptoms flare up

## 2013-07-01 NOTE — Progress Notes (Signed)
   Subjective:    Patient ID: Linda [name], female    DOB: 27-Feb-1934, 80 y.o.   MRN: [id number]  HPI  As patient was leaving she reported that she has hx of diverticulosis and believes she has a flare up. She had some left over cipro and has started to take.    Review of Systems  Gastrointestinal: Positive for abdominal pain. Negative for blood in stool.       LLQ pain       Objective:   Physical Exam  Abdominal: Soft. Bowel sounds are normal. There is tenderness.  Tenderness left lower quadrant          Assessment & Plan:

## 2013-07-01 NOTE — Assessment & Plan Note (Signed)
Recently seen by ENT and determined this was 2/2 GERD. Started on medication and symptoms improved. Check calcium and magnesium - hx parathyroid gland removed

## 2013-07-01 NOTE — Progress Notes (Signed)
Pre visit review using our clinic review tool, if applicable. No additional management support is needed unless otherwise documented below in the visit note. 

## 2013-07-01 NOTE — Addendum Note (Signed)
Addended by: Sherryl Barters on: 07/01/2013 10:30 AM   Modules accepted: Level of Service

## 2013-07-01 NOTE — Assessment & Plan Note (Signed)
Suspect LLQ pain 2/2 diverticulitis. Start Cipro and flagyl. Call if no improvement in symptoms

## 2013-07-01 NOTE — Progress Notes (Signed)
Subjective:    Patient ID: Linda [name], female    DOB: 28-Aug-1933, 80 y.o.   MRN: [id number]  HPI  Pt presents to clinic for follow up:  Left shoulder pain. She was seen in clinic on 03/20/13.  She was experiencing pain with abduction of arm at 90. Decreased range of motion. Plain x-ray showed no bony abnormality or dislocation. She was asked to apply ice to the area 3-4 times a day for 15 minutes. Tylenol for pain. Use a sling to immobilize the arm. Symptoms are completely resolved.  She is s/p follow up visit to ENT for hoarseness of voice, worse in the morning. Pt states she has had to have a parathyroid removed 10 years ago. Pt also reports history of acid reflux. Pt sees Dr. Jonna Clark and has been told in the past that she may have esophagitis. Dr. Tami Ribas gave her prescription for GERD which she reports has improved her symptoms. She did not have any blood work done and reports has not had any in > 6 months.  She is requesting order for Mammogram. She received letter from [facility] stating she was due.  During exam, pulse was noted to drop into the 40s. Currently 56 on recheck. Pt denies fatigue, tired or fatigued.    Past Medical History  Diagnosis Date  . Arrhythmia     paroxysmal atrial fibrillation  . PVC (premature ventricular contraction)   . Diverticulitis   . Hyperlipidemia   . Kidney stone   . Colon polyp   . Parathyroid disease     Parathyroidectomy   . UTI (lower urinary tract infection)      Past Surgical History  Procedure Laterality Date  . Tonsillectomy  1956  . Vein ligation and stripping    . Cholecystectomy  2002  . Appendectomy  1984  . Total abdominal hysterectomy w/ bilateral salpingoophorectomy  1984  . Rotator cuff repair  2002  . Parathyroidectomy  2004    1 removed     Family History  Problem Relation Age of Onset  . Heart disease Brother      History   Social History  . Marital Status: Single    Spouse Name: Billy    Number  of Children: 2  . Years of Education: 12   Occupational History  . Accounting     Retired   Social History Main Topics  . Smoking status: Never Smoker   . Smokeless tobacco: Never Used  . Alcohol Use: No  . Drug Use: No  . Sexual Activity: Not on file   Other Topics Concern  . Not on file   Social History Narrative   Linda [name] is from [facility] and grew up [place] a farm. She is married to [place], her husband of 43 years. They have a daughter and a son. She enjoys gardening, traveling with her husband.     Review of Systems  Constitutional: Negative.  Negative for fatigue.  HENT: Negative.   Eyes: Negative.   Respiratory: Negative.   Cardiovascular: Negative.   Gastrointestinal: Negative.   Endocrine: Negative.   Genitourinary: Negative.   Musculoskeletal: Negative.        Left shoulder pain resolved  Skin: Negative.   Allergic/Immunologic: Negative.   Neurological: Negative.   Hematological: Negative.   Psychiatric/Behavioral: Negative.        Objective:   Physical Exam  Constitutional: She is oriented to person, place, and time. She appears well-developed and well-nourished. No distress.  Cardiovascular: Regular rhythm.  Exam reveals no gallop and no friction rub.   No murmur heard. Bradycardia, HR 56 - asymptomatic.  Pulmonary/Chest: Effort normal and breath sounds normal. No respiratory distress. She has no wheezes. She has no rales.  Musculoskeletal: Normal range of motion.  Neurological: She is alert and oriented to person, place, and time.  Skin: Skin is warm and dry.  Psychiatric: She has a normal mood and affect. Her behavior is normal. Judgment and thought content normal.          Assessment & Plan:

## 2013-07-01 NOTE — Assessment & Plan Note (Signed)
Mammogram ordered

## 2013-07-01 NOTE — Addendum Note (Signed)
Addended by: Sherryl Barters on: 07/01/2013 10:28 AM   Modules accepted: Orders

## 2013-07-02 ENCOUNTER — Telehealth: Payer: Self-pay | Admitting: *Deleted

## 2013-07-02 NOTE — Telephone Encounter (Signed)
Called spoke with pt, pt states she started on Cipro 500mg  BID on Friday 06/27/13, unsure how long she will be taking she thinks 10 days.  Pt states she has an appt tomorrow for PT/INR at [facility].  Unsure if they are planning to start managing pt's Coumadin, as we do not monitor outside labs we POCT test in office.  Pt will keep scheduled check for INR tomorrow at [facility] office and dose adjust according to results and their recommendation.  WCB with questions.

## 2013-07-02 NOTE — Telephone Encounter (Signed)
Doctor put her on Cipro 500mg  for diverticulitis for three days and needs to go on a bland diet. Please call as she needs your advise.

## 2013-07-03 ENCOUNTER — Encounter: Payer: Self-pay | Admitting: Adult Health

## 2013-07-03 ENCOUNTER — Other Ambulatory Visit (INDEPENDENT_AMBULATORY_CARE_PROVIDER_SITE_OTHER): Payer: Medicare Other

## 2013-07-03 DIAGNOSIS — I4891 Unspecified atrial fibrillation: Secondary | ICD-10-CM

## 2013-07-03 LAB — PROTIME-INR
INR: 2.1 ratio — AB (ref 0.8–1.0)
Prothrombin Time: 22.3 s — ABNORMAL HIGH (ref 10.2–12.4)

## 2013-07-08 ENCOUNTER — Ambulatory Visit: Payer: Medicare Other | Admitting: Adult Health

## 2013-07-14 ENCOUNTER — Telehealth: Payer: Self-pay | Admitting: Adult Health

## 2013-07-14 ENCOUNTER — Other Ambulatory Visit: Payer: Self-pay | Admitting: Adult Health

## 2013-07-14 MED ORDER — PRAVASTATIN SODIUM 20 MG PO TABS: 20.0000 mg | ORAL_TABLET | Freq: Every day | ORAL | Status: DC

## 2013-07-14 NOTE — Telephone Encounter (Signed)
Respond to patient inquiry about labs

## 2013-07-14 NOTE — Telephone Encounter (Signed)
Spoke to patient and was advised that when she was in the office she had requested that a refill for her Pravastatin be sent to [facility]. Patient also thought that her A1C was going to be checked. Patient states that she has an appointment scheduled in July to see Raquel and wants to know if she needs to have lab work done before that appointment and if so she would like the A1C checked.

## 2013-07-14 NOTE — Telephone Encounter (Signed)
pravastatin (PRAVACHOL) 20 MG tablet ° °

## 2013-07-14 NOTE — Telephone Encounter (Signed)
[facility] and she is wanting her A1C results. Please advise

## 2013-07-14 NOTE — Telephone Encounter (Signed)
Left message for patient to call back and advise which pharmacy to send the script to.

## 2013-07-16 ENCOUNTER — Ambulatory Visit (INDEPENDENT_AMBULATORY_CARE_PROVIDER_SITE_OTHER): Payer: Medicare Other

## 2013-07-16 DIAGNOSIS — Z7901 Long term (current) use of anticoagulants: Secondary | ICD-10-CM

## 2013-07-16 DIAGNOSIS — I4891 Unspecified atrial fibrillation: Secondary | ICD-10-CM

## 2013-07-16 DIAGNOSIS — Z5181 Encounter for therapeutic drug level monitoring: Secondary | ICD-10-CM

## 2013-07-16 LAB — POCT INR: INR: 3

## 2013-07-24 ENCOUNTER — Ambulatory Visit: Payer: Self-pay | Admitting: Adult Health

## 2013-08-01 ENCOUNTER — Ambulatory Visit: Payer: Self-pay | Admitting: Adult Health

## 2013-08-13 ENCOUNTER — Ambulatory Visit (INDEPENDENT_AMBULATORY_CARE_PROVIDER_SITE_OTHER): Payer: Medicare Other

## 2013-08-13 DIAGNOSIS — Z5181 Encounter for therapeutic drug level monitoring: Secondary | ICD-10-CM

## 2013-08-13 DIAGNOSIS — I4891 Unspecified atrial fibrillation: Secondary | ICD-10-CM

## 2013-08-13 DIAGNOSIS — Z7901 Long term (current) use of anticoagulants: Secondary | ICD-10-CM

## 2013-08-13 LAB — POCT INR: INR: 3.5

## 2013-08-25 ENCOUNTER — Telehealth: Payer: Self-pay | Admitting: Adult Health

## 2013-08-25 NOTE — Telephone Encounter (Signed)
Asking for a recommendation from [facility] for a primary care physician for her husband at [facility] location.  Would prefer a man, unless Raquel feels that one of the female physicians is best.  Is a diabetic and has congestive heart failure.  Asking for a call or mychart message.  Would prefer to speak with Raquel if possible.

## 2013-08-26 ENCOUNTER — Encounter: Payer: Self-pay | Admitting: Adult Health

## 2013-08-27 NOTE — Telephone Encounter (Signed)
I am not sure who is accepting patients at [facility] but can try Dr. Silvio Pate or Dr. Edilia Bo.

## 2013-08-28 NOTE — Telephone Encounter (Signed)
Sent mychart message

## 2013-09-02 ENCOUNTER — Encounter: Payer: Self-pay | Admitting: Adult Health

## 2013-09-03 ENCOUNTER — Ambulatory Visit (INDEPENDENT_AMBULATORY_CARE_PROVIDER_SITE_OTHER): Payer: Medicare Other

## 2013-09-03 DIAGNOSIS — Z7901 Long term (current) use of anticoagulants: Secondary | ICD-10-CM

## 2013-09-03 DIAGNOSIS — I4891 Unspecified atrial fibrillation: Secondary | ICD-10-CM

## 2013-09-03 DIAGNOSIS — Z5181 Encounter for therapeutic drug level monitoring: Secondary | ICD-10-CM

## 2013-09-03 LAB — POCT INR: INR: 1.8

## 2013-09-17 ENCOUNTER — Ambulatory Visit (INDEPENDENT_AMBULATORY_CARE_PROVIDER_SITE_OTHER): Payer: Medicare Other

## 2013-09-17 DIAGNOSIS — I4891 Unspecified atrial fibrillation: Secondary | ICD-10-CM

## 2013-09-17 DIAGNOSIS — Z7901 Long term (current) use of anticoagulants: Secondary | ICD-10-CM

## 2013-09-17 DIAGNOSIS — Z5181 Encounter for therapeutic drug level monitoring: Secondary | ICD-10-CM

## 2013-09-17 LAB — POCT INR: INR: 2.4

## 2013-10-08 ENCOUNTER — Ambulatory Visit (INDEPENDENT_AMBULATORY_CARE_PROVIDER_SITE_OTHER): Payer: Medicare Other

## 2013-10-08 DIAGNOSIS — Z5181 Encounter for therapeutic drug level monitoring: Secondary | ICD-10-CM

## 2013-10-08 DIAGNOSIS — I4891 Unspecified atrial fibrillation: Secondary | ICD-10-CM

## 2013-10-08 DIAGNOSIS — Z7901 Long term (current) use of anticoagulants: Secondary | ICD-10-CM

## 2013-10-08 LAB — POCT INR: INR: 2.5

## 2013-10-29 ENCOUNTER — Encounter: Payer: Self-pay | Admitting: Adult Health

## 2013-10-29 ENCOUNTER — Ambulatory Visit (INDEPENDENT_AMBULATORY_CARE_PROVIDER_SITE_OTHER): Payer: Medicare Other | Admitting: Adult Health

## 2013-10-29 VITALS — BP 122/82 | HR 42 | Temp 97.7°F | Wt 167.0 lb

## 2013-10-29 DIAGNOSIS — F4321 Adjustment disorder with depressed mood: Secondary | ICD-10-CM

## 2013-10-29 MED ORDER — LORAZEPAM 0.5 MG PO TABS: ORAL_TABLET | ORAL | Status: DC

## 2013-10-29 NOTE — Progress Notes (Signed)
Pre visit review using our clinic review tool, if applicable. No additional management support is needed unless otherwise documented below in the visit note. 

## 2013-10-29 NOTE — Patient Instructions (Signed)
  I am prescribing Ativan 0.5 mg 1 tablet daily as needed.  Continue to stay busy which will help.  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  Please come back to see me if you have any problems.

## 2013-10-29 NOTE — Progress Notes (Signed)
Patient ID: Linda [name], female   DOB: Jan 11, 1934, 80 y.o.   MRN: [id number]   Subjective:    Patient ID: Linda [name], female    DOB: 04/05/1934, 80 y.o.   MRN: [id number]  HPI Linda [name] is a pleasant 80 y/o female who presents to clinic with report that Linda [name] husband passed away on 10-08-2022 and Linda [name] is having some difficutly dealing with it. They were married 75 years, Linda [name] has Linda [name] good and bad moments. Linda [name] feels that Linda [name] needs something to help Linda [name].   Linda [name] reports that Linda [name] husband was a diabetic. He went into heart failure and then kidney failure. He was at the [facility] in [place] for a short period of time then they took him home where he died peacefully. Linda [name] has a daughter who has been very supportive. Linda [name] job has Linda [name] traveling quite a bit so Linda [name] does not get to see Linda [name] much. Linda [name] son lives in [place]. Linda [name] has been staying active. Linda [name] goes to the gym 3 times a week with a group of friends that allow Linda [name] to cry when Linda [name] feels Linda [name] needs to. Linda [name] is maintaining Linda [name] garden. Linda [name] attends church regularly. Pt had a prescription for Ativan from 2013 and stated that Linda [name] still had some pills left over. Linda [name] took this during the funeral and helped get Linda [name] through this. Linda [name] would like a new prescription for those "bad days".    Past Medical History  Diagnosis Date  . Arrhythmia     paroxysmal atrial fibrillation  . PVC (premature ventricular contraction)   . Diverticulitis   . Hyperlipidemia   . Kidney stone   . Colon polyp   . Parathyroid disease     Parathyroidectomy   . UTI (lower urinary tract infection)      Past Surgical History  Procedure Laterality Date  . Tonsillectomy  1956  . Vein ligation and stripping    . Cholecystectomy  2002  . Appendectomy  1984  . Total abdominal hysterectomy w/ bilateral salpingoophorectomy  1984  . Rotator cuff repair  2002  . Parathyroidectomy  2004    1 removed     Family History  Problem Relation Age of Onset  . Heart disease  Brother      History   Social History  . Marital Status: Single    Spouse Name: Billy    Number of Children: 2  . Years of Education: 12   Occupational History  . Accounting     Retired   Social History Main Topics  . Smoking status: Never Smoker   . Smokeless tobacco: Never Used  . Alcohol Use: No  . Drug Use: No  . Sexual Activity: Not on file   Other Topics Concern  . Not on file   Social History Narrative   Linda [name] is from [facility] and grew up on a farm. Recently widowed. Linda [name] was married to [place], Linda [name] husband for 59 years. They have a daughter and a son. Linda [name] enjoys gardening.     Current Outpatient Prescriptions on File Prior to Visit  Medication Sig Dispense Refill  . calcium carbonate (OS-CAL) 600 MG TABS Take 600 mg by mouth 2 (two) times daily with a meal.        . cholecalciferol (VITAMIN D) 400 UNITS TABS Take 2,000 Units by mouth daily.      Marland Kitchen co-enzyme Q-10 30 MG capsule Take 30 mg by mouth 2 (two) times daily.       Marland Kitchen  COUMADIN 5 MG tablet TAKE 1 TABLET (5 MG TOTAL) DAILY AS DIRECTED BY ANTI  COAG CLINIC  90 tablet  1  . diltiazem (CARDIZEM CD) 120 MG 24 hr capsule Take 1 capsule (120 mg total) by mouth daily.  90 capsule  3  . nitroGLYCERIN (NITROSTAT) 0.4 MG SL tablet Place 1 tablet (0.4 mg total) under the tongue every 5 (five) minutes as needed.  25 tablet  1  . omeprazole (PRILOSEC) 20 MG capsule Take 1 capsule (20 mg total) by mouth daily.  90 capsule  3  . pravastatin (PRAVACHOL) 20 MG tablet Take 1 tablet (20 mg total) by mouth daily.  90 tablet  3   No current facility-administered medications on file prior to visit.     Review of Systems  Psychiatric/Behavioral: Positive for sleep disturbance. Negative for behavioral problems, confusion and agitation. Nervous/anxious: depression following death of Linda [name] husband.   All other systems reviewed and are negative.      Objective:  Wt 167 lb (75.751 kg)   Physical Exam  Constitutional: Linda [name] is  oriented to person, place, and time. No distress.  HENT:  Head: Normocephalic and atraumatic.  Eyes: Conjunctivae and EOM are normal.  Neck: Normal range of motion. Neck supple.  Cardiovascular: Normal rate and regular rhythm.   Pulmonary/Chest: Effort normal. No respiratory distress.  Musculoskeletal: Normal range of motion.  Neurological: Linda [name] is alert and oriented to person, place, and time.  Skin: Skin is warm and dry.  Psychiatric: Linda [name] has a normal mood and affect. Linda [name] behavior is normal. Judgment and thought content normal.      Assessment & Plan:   1. Grief Pt appears to be grieving appropriately. Linda [name] has good and bad days as one might expect after being married to someone for 59 years. Linda [name] is doing everything Linda [name] needs to do to prevent Linda [name] grief from becoming complicated - Linda [name] has friends that Linda [name] can talk with, Linda [name] has a supportive family and Linda [name] is staying as active as possible. I am prescribing Ativan 0.5 mg 1 tablet daily as needed. We discussed signs and symptoms of depression and encouraged Linda [name] to call me if Linda [name] begins to have any of the symptoms. Linda [name] agreed Linda [name] would. I also provided Linda [name] with information on Cristin Saffo, Licensed Psychologist who recently moved to the [place] area with expertise in death, dying and grief. I also let Linda [name] know that if Linda [name] would like me to make the referral to let me know. RTC prn.

## 2013-11-04 ENCOUNTER — Ambulatory Visit (INDEPENDENT_AMBULATORY_CARE_PROVIDER_SITE_OTHER): Payer: Medicare Other | Admitting: Cardiovascular Disease

## 2013-11-04 ENCOUNTER — Encounter: Payer: Self-pay | Admitting: Cardiovascular Disease

## 2013-11-04 ENCOUNTER — Ambulatory Visit (INDEPENDENT_AMBULATORY_CARE_PROVIDER_SITE_OTHER): Payer: Medicare Other

## 2013-11-04 VITALS — BP 128/70 | HR 77 | Ht 63.0 in | Wt 168.0 lb

## 2013-11-04 DIAGNOSIS — I4891 Unspecified atrial fibrillation: Secondary | ICD-10-CM

## 2013-11-04 DIAGNOSIS — I10 Essential (primary) hypertension: Secondary | ICD-10-CM | POA: Insufficient documentation

## 2013-11-04 DIAGNOSIS — E785 Hyperlipidemia, unspecified: Secondary | ICD-10-CM

## 2013-11-04 DIAGNOSIS — R5381 Other malaise: Secondary | ICD-10-CM

## 2013-11-04 DIAGNOSIS — R5383 Other fatigue: Secondary | ICD-10-CM

## 2013-11-04 DIAGNOSIS — I4949 Other premature depolarization: Secondary | ICD-10-CM

## 2013-11-04 DIAGNOSIS — Z5181 Encounter for therapeutic drug level monitoring: Secondary | ICD-10-CM

## 2013-11-04 DIAGNOSIS — Z7901 Long term (current) use of anticoagulants: Secondary | ICD-10-CM

## 2013-11-04 LAB — POCT INR: INR: 2.2

## 2013-11-04 NOTE — Assessment & Plan Note (Signed)
Heart rate is relatively well controlled. Asymptomatic PVCs. No changes to her medications

## 2013-11-04 NOTE — Patient Instructions (Signed)
You are doing well. No medication changes were made.  Monitor your blood pressure at home especially when dizzy or you don't feel well  Please call us if you have new issues that need to be addressed before your next appt.  Your physician wants you to follow-up in: 6 months.  You will receive a reminder letter in the mail two months in advance. If you don't receive a letter, please call our office to schedule the follow-up appointment.

## 2013-11-04 NOTE — Assessment & Plan Note (Signed)
In general she seems to be asymptomatic.

## 2013-11-04 NOTE — Assessment & Plan Note (Signed)
Encouraged her to stay on her pravastatin

## 2013-11-04 NOTE — Progress Notes (Signed)
Patient ID: Linda [name], female    DOB: Jan 27, 1934, 80 y.o.   MRN: [id number]  HPI Comments: Linda [name] is a 80 year old woman with past medical history of chronic atrial fibrillation on warfarin, normal coronary arteries in September 2007,  previous nocturnal oximetry had showed some desaturations though she reports sleeping well and has no significant snoring or apnea, who presents for followup of her atrial fibrillation.  In followup today, she reports having good days and bad days. Recently lost her husband in early 2015. She reports having labile blood pressure this morning, systolics in the 02R and 42H. Our office is back to normal. At home typically does not have low blood pressure. She has poor sleep, typically wakes early in the morning, goes to sleep at 8 p.m.   denies having orthostatic type symptoms   otherwise very active, working in her garden  She continues to have mild fatigue. She reports that her blood pressure has been well controlled at home. Lab work shows total cholesterol 174, LDL 89, HDL 68, hemoglobin A1c 6.3, creatinine 0.8  Recent lab work showing hemoglobin A1c 6.3, total cholesterol 180, LDL 97, creatinine 0.8, normal LFTs  EKG shows atrial fibrillation with ventricular rate 77 beats per minute, T-wave abnormality in leads  V4 through V6, PVCs noted    Outpatient Encounter Prescriptions as of 11/04/2013  Medication Sig  . calcium carbonate (OS-CAL) 600 MG TABS Take 600 mg by mouth 2 (two) times daily with a meal.    . cholecalciferol (VITAMIN D) 400 UNITS TABS Take 2,000 Units by mouth daily.  Marland Kitchen co-enzyme Q-10 30 MG capsule Take 30 mg by mouth 2 (two) times daily.   Marland Kitchen COUMADIN 5 MG tablet TAKE 1 TABLET (5 MG TOTAL) DAILY AS DIRECTED BY ANTI  COAG CLINIC  . diltiazem (CARDIZEM CD) 120 MG 24 hr capsule Take 1 capsule (120 mg total) by mouth daily.  Marland Kitchen LORazepam (ATIVAN) 0.5 MG tablet Take 1 tablet daily as needed.  . Multiple Vitamins-Minerals ([employer] SPECTRAVITE ADULT  50+ PO) Take by mouth.  . nitroGLYCERIN (NITROSTAT) 0.4 MG SL tablet Place 1 tablet (0.4 mg total) under the tongue every 5 (five) minutes as needed.  Marland Kitchen omeprazole (PRILOSEC) 20 MG capsule Take 1 capsule (20 mg total) by mouth daily.  . pravastatin (PRAVACHOL) 20 MG tablet Take 1 tablet (20 mg total) by mouth daily.     Review of Systems  Constitutional: Negative.   HENT: Negative.   Eyes: Negative.   Respiratory: Negative.   Cardiovascular: Negative.   Gastrointestinal: Negative.   Endocrine: Negative.   Musculoskeletal: Negative.   Skin: Negative.   Allergic/Immunologic: Negative.   Neurological: Negative.   Hematological: Negative.   Psychiatric/Behavioral: Negative.   All other systems reviewed and are negative.   BP 128/70  Pulse 77  Ht 5\' 3"  (1.6 m)  Wt 168 lb (76.204 kg)  BMI 29.77 kg/m2  Physical Exam  Nursing note and vitals reviewed. Constitutional: She is oriented to person, place, and time. She appears well-developed and well-nourished.  HENT:  Head: Normocephalic.  Nose: Nose normal.  Mouth/Throat: Oropharynx is clear and moist.  Eyes: Conjunctivae are normal. Pupils are equal, round, and reactive to light.  Neck: Normal range of motion. Neck supple. No JVD present.  Cardiovascular: Normal rate, S1 normal, S2 normal and intact distal pulses.  An irregularly irregular rhythm present. Exam reveals no gallop and no friction rub.   Murmur heard.  Crescendo systolic murmur is present with a grade  of 2/6  Pulmonary/Chest: Effort normal and breath sounds normal. No respiratory distress. She has no wheezes. She has no rales. She exhibits no tenderness.  Abdominal: Soft. Bowel sounds are normal. She exhibits no distension. There is no tenderness.  Musculoskeletal: Normal range of motion. She exhibits no edema and no tenderness.  Lymphadenopathy:    She has no cervical adenopathy.  Neurological: She is alert and oriented to person, place, and time. Coordination  normal.  Skin: Skin is warm and dry. No rash noted. No erythema.  Psychiatric: She has a normal mood and affect. Her behavior is normal. Judgment and thought content normal.    Assessment and Plan

## 2013-11-04 NOTE — Assessment & Plan Note (Signed)
She reports running low blood pressure this morning. This could possibly be from her PVCs and misregistration of her blood pressure cuff

## 2013-12-03 ENCOUNTER — Ambulatory Visit (INDEPENDENT_AMBULATORY_CARE_PROVIDER_SITE_OTHER): Payer: Medicare Other

## 2013-12-03 DIAGNOSIS — I4891 Unspecified atrial fibrillation: Secondary | ICD-10-CM

## 2013-12-03 DIAGNOSIS — Z5181 Encounter for therapeutic drug level monitoring: Secondary | ICD-10-CM

## 2013-12-03 DIAGNOSIS — Z7901 Long term (current) use of anticoagulants: Secondary | ICD-10-CM

## 2013-12-03 LAB — POCT INR: INR: 2.2

## 2013-12-31 ENCOUNTER — Ambulatory Visit (INDEPENDENT_AMBULATORY_CARE_PROVIDER_SITE_OTHER): Payer: Medicare Other

## 2013-12-31 DIAGNOSIS — Z5181 Encounter for therapeutic drug level monitoring: Secondary | ICD-10-CM

## 2013-12-31 DIAGNOSIS — I4891 Unspecified atrial fibrillation: Secondary | ICD-10-CM

## 2013-12-31 DIAGNOSIS — Z7901 Long term (current) use of anticoagulants: Secondary | ICD-10-CM

## 2013-12-31 LAB — POCT INR: INR: 2.4

## 2014-01-13 ENCOUNTER — Ambulatory Visit: Payer: Medicare Other

## 2014-01-13 ENCOUNTER — Encounter: Payer: Self-pay | Admitting: Adult Health

## 2014-01-13 ENCOUNTER — Ambulatory Visit (INDEPENDENT_AMBULATORY_CARE_PROVIDER_SITE_OTHER): Payer: Medicare Other | Admitting: Adult Health

## 2014-01-13 ENCOUNTER — Other Ambulatory Visit: Payer: Self-pay | Admitting: *Deleted

## 2014-01-13 VITALS — BP 119/82 | HR 71 | Temp 98.1°F | Resp 14 | Ht 63.5 in | Wt 166.5 lb

## 2014-01-13 DIAGNOSIS — Z0000 Encounter for general adult medical examination without abnormal findings: Secondary | ICD-10-CM

## 2014-01-13 DIAGNOSIS — R5383 Other fatigue: Secondary | ICD-10-CM

## 2014-01-13 DIAGNOSIS — R7309 Other abnormal glucose: Secondary | ICD-10-CM

## 2014-01-13 DIAGNOSIS — I10 Essential (primary) hypertension: Secondary | ICD-10-CM

## 2014-01-13 DIAGNOSIS — E559 Vitamin D deficiency, unspecified: Secondary | ICD-10-CM

## 2014-01-13 DIAGNOSIS — F4321 Adjustment disorder with depressed mood: Secondary | ICD-10-CM

## 2014-01-13 DIAGNOSIS — R5381 Other malaise: Secondary | ICD-10-CM

## 2014-01-13 DIAGNOSIS — E785 Hyperlipidemia, unspecified: Secondary | ICD-10-CM

## 2014-01-13 DIAGNOSIS — E538 Deficiency of other specified B group vitamins: Secondary | ICD-10-CM

## 2014-01-13 LAB — CBC WITH DIFFERENTIAL/PLATELET
Basophils Absolute: 0 10*3/uL (ref 0.0–0.1)
Basophils Relative: 0.5 % (ref 0.0–3.0)
Eosinophils Absolute: 0.1 10*3/uL (ref 0.0–0.7)
Eosinophils Relative: 1.4 % (ref 0.0–5.0)
HCT: 45.7 % (ref 36.0–46.0)
Hemoglobin: 15.4 g/dL — ABNORMAL HIGH (ref 12.0–15.0)
Lymphocytes Relative: 19.2 % (ref 12.0–46.0)
Lymphs Abs: 1.2 10*3/uL (ref 0.7–4.0)
MCHC: 33.7 g/dL (ref 30.0–36.0)
MCV: 94.4 fl (ref 78.0–100.0)
Monocytes Absolute: 0.6 10*3/uL (ref 0.1–1.0)
Monocytes Relative: 9.6 % (ref 3.0–12.0)
NEUTROS PCT: 69.3 % (ref 43.0–77.0)
Neutro Abs: 4.2 10*3/uL (ref 1.4–7.7)
Platelets: 193 10*3/uL (ref 150.0–400.0)
RBC: 4.84 Mil/uL (ref 3.87–5.11)
RDW: 14 % (ref 11.5–15.5)
WBC: 6 10*3/uL (ref 4.0–10.5)

## 2014-01-13 LAB — VITAMIN D 25 HYDROXY (VIT D DEFICIENCY, FRACTURES): VITD: 39.8 ng/mL (ref 30.00–100.00)

## 2014-01-13 LAB — BASIC METABOLIC PANEL
BUN: 20 mg/dL (ref 6–23)
CALCIUM: 9 mg/dL (ref 8.4–10.5)
CO2: 28 meq/L (ref 19–32)
Chloride: 103 mEq/L (ref 96–112)
Creatinine, Ser: 0.9 mg/dL (ref 0.4–1.2)
GFR: 68.72 mL/min (ref >60.00)
Glucose, Bld: 123 mg/dL — ABNORMAL HIGH (ref 70–99)
Potassium: 3.5 mEq/L (ref 3.5–5.1)
SODIUM: 139 meq/L (ref 135–145)

## 2014-01-13 LAB — LIPID PANEL
Cholesterol: 202 mg/dL — ABNORMAL HIGH (ref 0–200)
HDL: 70.3 mg/dL (ref >39.00)
LDL Cholesterol: 121 mg/dL — ABNORMAL HIGH (ref 0–99)
NONHDL: 131.7
TRIGLYCERIDES: 53 mg/dL (ref 0.0–149.0)
Total CHOL/HDL Ratio: 3
VLDL: 10.6 mg/dL (ref 0.0–40.0)

## 2014-01-13 LAB — VITAMIN B12: Vitamin B-12: 345 pg/mL (ref 211–911)

## 2014-01-13 LAB — TSH: TSH: 1.21 u[IU]/mL (ref 0.35–4.50)

## 2014-01-13 LAB — HEMOGLOBIN A1C: HEMOGLOBIN A1C: 6.2 % (ref 4.6–6.5)

## 2014-01-13 MED ORDER — TRAZODONE HCL 50 MG PO TABS: 25.0000 mg | ORAL_TABLET | Freq: Every evening | ORAL | Status: DC | PRN

## 2014-01-13 NOTE — Progress Notes (Signed)
Pre visit review using our clinic review tool, if applicable. No additional management support is needed unless otherwise documented below in the visit note. 

## 2014-01-13 NOTE — Patient Instructions (Signed)
  You had your Medicare Wellness Exam today.  Please have your labs drawn. I will notify you of results once they are available.  Trazodone 25 mg at bedtime for sleep. Your tablets are 50 mg so you will need to cut them in half. If half a tablet is not effective you may take a whole tablet.

## 2014-01-13 NOTE — Progress Notes (Signed)
Patient ID: MARVEL SAPP, female   DOB: 1933-12-04, 80 y.o.   MRN: [id number]   Subjective:    Patient ID: [name], female    DOB: 12-27-1933, 80 y.o.   MRN: [id number]  HPI  The patient is here for annual Medicare wellness examination and other chronic problems.   The risk factors are reflected in the social history.  The roster of all physicians providing medical care to patient is listed in the Snapshot section of the chart.  Activities of daily living:  The patient is 100% independent in all ADLs: dressing, toileting, bathing, feeding as well as independent mobility.  Instrumental Activities of daily living: The patient is 100% independent in all iADLs: cooking, driving, keeping track of finances, managing medications, shopping, using telephone and computer.  Home safety: The patient has smoke detectors in the home. Seatbelts are worn 100%.  There are firearms at home. There is no violence in the home. No hx of IPV.  There is no risks for hepatitis, STDs or HIV. There is no history of blood transfusion. No travel history to infectious disease endemic areas of the world.  The patient has seen dentist in the last six month. Pt has seen eye doctor in the last year. No hearing impairment. They have deferred audiologic testing in the last year.  No excessive sun exposure. Discussed the need for sun protection: hats, long sleeves and use of sunscreen if there is significant sun exposure.   Diet: the importance of a healthy diet is discussed. Pt follows a healthy diet.  The benefits of regular aerobic exercise were discussed. Pt walks daily. She works outside in her garden.  Depression screen: there are no signs or vegative symptoms of depression- irritability, change in appetite, anhedonia, sadness. Improving grief from recent loss of her husband (4 months ago). Tearful at times. Some trouble sleeping. Does not really like after effects of Ativan. Only takes as needed and rarely.  Supportive friends and family.  Cognitive assessment: the patient manages all their financial and personal affairs and is actively engaged. Able to relate day, date, year and events; recalled 2/3 objects at 3 minutes.  The following portions of the patient's history were reviewed and updated as appropriate: allergies, current medications, past family history, past medical history,  past surgical history, past social history  and problem list.  Visual acuity was not assessed per patient preference since has regular follow up with ophthalmologist. Hearing and body mass index were assessed and reviewed.   During the course of the visit the patient was educated and counseled about appropriate screening and preventive services including : fall prevention , diabetes screening, nutrition counseling, colorectal cancer screening, and recommended immunizations. We discussed the shingles vaccine and she is not interested at this time.    Past Medical History  Diagnosis Date  . Arrhythmia     paroxysmal atrial fibrillation  . PVC (premature ventricular contraction)   . Diverticulitis   . Hyperlipidemia   . Kidney stone   . Colon polyp   . Parathyroid disease     Parathyroidectomy   . UTI (lower urinary tract infection)      Past Surgical History  Procedure Laterality Date  . Tonsillectomy  1956  . Vein ligation and stripping    . Cholecystectomy  2002  . Appendectomy  1984  . Total abdominal hysterectomy w/ bilateral salpingoophorectomy  1984  . Rotator cuff repair  2002  . Parathyroidectomy  2004    1 removed  Family History  Problem Relation Age of Onset  . Heart disease Brother      History   Social History  . Marital Status: Single    Spouse Name: Billy    Number of Children: 2  . Years of Education: 12   Occupational History  . Accounting     Retired   Social History Main Topics  . Smoking status: Never Smoker   . Smokeless tobacco: Never Used  . Alcohol Use:  No  . Drug Use: No  . Sexual Activity: Not on file   Other Topics Concern  . Not on file   Social History Narrative   Dailynn is from [facility] and grew up [place] a farm. Recently widowed. She was married to [place], her husband for 59 years. They have a daughter and a son. She enjoys gardening.     Current Outpatient Prescriptions on File Prior to Visit  Medication Sig Dispense Refill  . calcium carbonate (OS-CAL) 600 MG TABS Take 600 mg by mouth 2 (two) times daily with a meal.        . cholecalciferol (VITAMIN D) 400 UNITS TABS Take 2,000 Units by mouth daily.      Marland Kitchen co-enzyme Q-10 30 MG capsule Take 30 mg by mouth 2 (two) times daily.       Marland Kitchen COUMADIN 5 MG tablet TAKE 1 TABLET (5 MG TOTAL) DAILY AS DIRECTED BY ANTI  COAG CLINIC  90 tablet  1  . diltiazem (CARDIZEM CD) 120 MG 24 hr capsule Take 1 capsule (120 mg total) by mouth daily.  90 capsule  3  . LORazepam (ATIVAN) 0.5 MG tablet Take 1 tablet daily as needed.  30 tablet  3  . Multiple Vitamins-Minerals (CVS SPECTRAVITE ADULT 50+ PO) Take by mouth.      . nitroGLYCERIN (NITROSTAT) 0.4 MG SL tablet Place 1 tablet (0.4 mg total) under the tongue every 5 (five) minutes as needed.  25 tablet  1  . omeprazole (PRILOSEC) 20 MG capsule Take 1 capsule (20 mg total) by mouth daily.  90 capsule  3  . pravastatin (PRAVACHOL) 20 MG tablet Take 1 tablet (20 mg total) by mouth daily.  90 tablet  3   No current facility-administered medications on file prior to visit.     Review of Systems  Constitutional: Positive for fatigue (mild).  HENT: Negative.   Eyes: Negative.   Respiratory: Negative.   Cardiovascular: Negative.   Gastrointestinal: Negative.   Endocrine: Negative.   Genitourinary: Negative.   Musculoskeletal: Negative.   Skin: Negative.   Allergic/Immunologic: Negative.   Neurological: Negative.   Hematological: Negative.   Psychiatric/Behavioral: Positive for sleep disturbance.       Objective:  BP 119/82  Pulse 71   Temp(Src) 98.1 F (36.7 C) (Oral)  Resp 14  Ht 5' 3.5" (1.613 m)  Wt 166 lb 8 oz (75.524 kg)  BMI 29.03 kg/m2  SpO2 94%   Physical Exam  Constitutional: She is oriented to person, place, and time. She appears well-developed and well-nourished. No distress.  HENT:  Head: Normocephalic and atraumatic.  Right Ear: External ear normal.  Left Ear: External ear normal.  Nose: Nose normal.  Mouth/Throat: Oropharynx is clear and moist.  Eyes: Conjunctivae and EOM are normal. Pupils are equal, round, and reactive to light.  Neck: Normal range of motion. Neck supple. No tracheal deviation present. No thyromegaly present.  Cardiovascular: Normal rate, regular rhythm, normal heart sounds and intact distal pulses.  Exam  reveals no gallop and no friction rub.   No murmur heard. Pulmonary/Chest: Effort normal and breath sounds normal. No respiratory distress. She has no wheezes. She has no rales.  Abdominal: Soft. Bowel sounds are normal. She exhibits no distension and no mass. There is no tenderness. There is no rebound and no guarding.  Musculoskeletal: Normal range of motion. She exhibits no edema and no tenderness.  Lymphadenopathy:    She has no cervical adenopathy.  Neurological: She is alert and oriented to person, place, and time. She has normal reflexes. No cranial nerve deficit. Coordination normal.  Skin: Skin is warm and dry.  Psychiatric: She has a normal mood and affect. Her behavior is normal. Judgment and thought content normal.      Assessment & Plan:   1. Medicare annual wellness visit, subsequent Normal physical exam. Screenings addressed and listed separately as indicated. She is not interested in the shingles vaccine at this time. Up to date on her mammogram. Considering whether she wants to continue having them. Discussion pertaining to benefits vs risk. Also, she is a healthy 81 y/o. Needs to consider whether if cancer were detected whether she would want to pursue treatment.  She will think about this and let me know if she decides to have a mammogram when due.  2. Other fatigue Mild fatigue. May be 2/2 not sleeping well. Check labs. - TSH - CBC with Differential  3. Essential hypertension Very well controlled. Check labs. Follow - Basic metabolic panel  4. HLD (hyperlipidemia) Check cholesterol. Pt on pravastatin. Follow - Lipid panel  5. Vitamin D deficiency Will check vit d level. follow - Vit D  25 hydroxy (rtn osteoporosis monitoring)  6. B12 deficiency Check level. follow - Vitamin B12  7. Grief Death of her husband of 44 years 4 months ago. She is tearful on occasion when talking about her husband. She has very supportive family and friends. She is staying busy which helps her. She feels that every day it is a little better. I provided her with information on a therapist that specializes in grief, bereavement but she reports she does not feel she needs her services. We discussed that it is ok for her to cry and feel sad sometimes. It is a lifetime that she spent with her husband. Grief takes time to resolve. She does not feel depressed. She is highly functional and as mentioned she stays active. She knows to call/come to clinic if she feels symptoms are worsening.

## 2014-02-04 ENCOUNTER — Telehealth: Payer: Self-pay | Admitting: Adult Health

## 2014-02-04 NOTE — Telephone Encounter (Signed)
[facility] called to schedule this patient for a mammogram follow up . I called the patient and she stated that she had to pay for her mammogram last time, and she was checked by you therefore she did not think it was necessary to have another one. Please advise.   [facility] left Mammogram for Left Breast Asymmetry   9.9.15 @ 10:40

## 2014-02-06 NOTE — Telephone Encounter (Signed)
Clinical breast exam does not guarantee 100% that there is no abnormality. The mammogram showed asymmetry and I would recommend that she have this done. She can call the business office and arrange for payments if necessary.

## 2014-02-06 NOTE — Telephone Encounter (Signed)
Sent mychart message with response. 

## 2014-02-11 ENCOUNTER — Ambulatory Visit (INDEPENDENT_AMBULATORY_CARE_PROVIDER_SITE_OTHER): Payer: Medicare Other

## 2014-02-11 DIAGNOSIS — Z7901 Long term (current) use of anticoagulants: Secondary | ICD-10-CM

## 2014-02-11 DIAGNOSIS — I4891 Unspecified atrial fibrillation: Secondary | ICD-10-CM

## 2014-02-11 DIAGNOSIS — Z5181 Encounter for therapeutic drug level monitoring: Secondary | ICD-10-CM

## 2014-02-11 LAB — POCT INR: INR: 2.8

## 2014-02-19 ENCOUNTER — Encounter: Payer: Self-pay | Admitting: Cardiovascular Disease

## 2014-02-20 ENCOUNTER — Other Ambulatory Visit: Payer: Self-pay

## 2014-02-20 MED ORDER — DILTIAZEM HCL ER COATED BEADS 120 MG PO CP24: 120.0000 mg | ORAL_CAPSULE | Freq: Every day | ORAL | Status: DC

## 2014-02-20 MED ORDER — COUMADIN 5 MG PO TABS: ORAL_TABLET | ORAL | Status: DC

## 2014-02-25 ENCOUNTER — Other Ambulatory Visit: Payer: Self-pay | Admitting: Adult Health

## 2014-02-25 ENCOUNTER — Ambulatory Visit: Payer: Self-pay | Admitting: Adult Health

## 2014-02-25 DIAGNOSIS — N6320 Unspecified lump in the left breast, unspecified quadrant: Secondary | ICD-10-CM

## 2014-03-04 ENCOUNTER — Encounter: Payer: Self-pay | Admitting: Adult Health

## 2014-03-04 ENCOUNTER — Ambulatory Visit (INDEPENDENT_AMBULATORY_CARE_PROVIDER_SITE_OTHER): Payer: Medicare Other | Admitting: Adult Health

## 2014-03-04 VITALS — BP 134/85 | HR 72 | Temp 98.0°F | Resp 14 | Wt 166.8 lb

## 2014-03-04 DIAGNOSIS — Z23 Encounter for immunization: Secondary | ICD-10-CM

## 2014-03-04 DIAGNOSIS — M79605 Pain in left leg: Principal | ICD-10-CM

## 2014-03-04 DIAGNOSIS — K219 Gastro-esophageal reflux disease without esophagitis: Secondary | ICD-10-CM | POA: Insufficient documentation

## 2014-03-04 DIAGNOSIS — M79609 Pain in unspecified limb: Secondary | ICD-10-CM

## 2014-03-04 DIAGNOSIS — M79604 Pain in right leg: Secondary | ICD-10-CM

## 2014-03-04 MED ORDER — LORAZEPAM 0.5 MG PO TABS: ORAL_TABLET | ORAL | Status: DC

## 2014-03-04 NOTE — Patient Instructions (Signed)
  I am sending you to [facility] for an ultrasound of both legs. Please see Hoyle Sauer to schedule this prior to leaving.  Try Red Yeast Rice to help lower your cholesterol since you are not taking the statin.  For your stomach symptoms, start Prilosec 20 mg daily. If no improvement in 1 month then we will need to send you to GI for further evaluation.

## 2014-03-04 NOTE — Progress Notes (Signed)
Pre visit review using our clinic review tool, if applicable. No additional management support is needed unless otherwise documented below in the visit note. 

## 2014-03-04 NOTE — Progress Notes (Signed)
Patient ID: Linda [name], female   DOB: 03/08/1934, 80 y.o.   MRN: [id number]   Subjective:    Patient ID: Linda [name], female    DOB: 07-Aug-1933, 80 y.o.   MRN: [id number]  HPI  Pt is a very pleasant 80 y/o female who presents to clinic for the following:  1. Leg pain, bilateral She denies any injury. Reports that she stopped taking her statin approximately 5-6 days ago and feels improved. Was having arm pains as well. She does have hx of varicose veins but she reports that the pain started shortly after starting statins.  2. Gastroesophageal reflux disease, esophagitis presence not specified Has been taking zantac bid but still having breakthrough symptoms. No n/v/d. No pain reported.   Past Medical History  Diagnosis Date  . Arrhythmia     paroxysmal atrial fibrillation  . PVC (premature ventricular contraction)   . Diverticulitis   . Hyperlipidemia   . Kidney stone   . Colon polyp   . Parathyroid disease     Parathyroidectomy   . UTI (lower urinary tract infection)     Current Outpatient Prescriptions on File Prior to Visit  Medication Sig Dispense Refill  . calcium carbonate (OS-CAL) 600 MG TABS Take 600 mg by mouth 2 (two) times daily with a meal.        . cholecalciferol (VITAMIN D) 400 UNITS TABS Take 2,000 Units by mouth daily.      Marland Kitchen co-enzyme Q-10 30 MG capsule Take 30 mg by mouth 2 (two) times daily.       Marland Kitchen COUMADIN 5 MG tablet TAKE 1 TABLET (5 MG TOTAL) DAILY AS DIRECTED BY ANTI  COAG CLINIC  90 tablet  1  . diltiazem (CARDIZEM CD) 120 MG 24 hr capsule Take 1 capsule (120 mg total) by mouth daily.  90 capsule  3  . Multiple Vitamins-Minerals ([facility] SPECTRAVITE ADULT 50+ PO) Take by mouth.      . nitroGLYCERIN (NITROSTAT) 0.4 MG SL tablet Place 1 tablet (0.4 mg total) under the tongue every 5 (five) minutes as needed.  25 tablet  1  . ranitidine (ZANTAC) 150 MG tablet Take 75 mg by mouth 2 (two) times daily as needed for heartburn.       . pravastatin (PRAVACHOL)  20 MG tablet Take 1 tablet (20 mg total) by mouth daily.  90 tablet  3   No current facility-administered medications on file prior to visit.     Review of Systems  Gastrointestinal:       Acid indigestion  Musculoskeletal: Positive for myalgias (legs, arms. Improved when stopped statin).  Neurological: Negative.   Psychiatric/Behavioral: Positive for sleep disturbance (does not like after effect of trazodone). Negative for behavioral problems, confusion and agitation. The patient is not nervous/anxious.   All other systems reviewed and are negative.      Objective:  BP 134/85  Pulse 72  Temp(Src) 98 F (36.7 C) (Oral)  Resp 14  Wt 166 lb 12 oz (75.637 kg)  SpO2 95%   Physical Exam  Constitutional: She is oriented to person, place, and time. No distress.  HENT:  Head: Normocephalic and atraumatic.  Eyes: Conjunctivae and EOM are normal.  Neck: Normal range of motion. Neck supple.  Cardiovascular: Normal rate, regular rhythm, normal heart sounds and intact distal pulses.  Exam reveals no gallop and no friction rub.   No murmur heard. Excellent pedal pulses  Pulmonary/Chest: Effort normal and breath sounds normal. No respiratory distress. She  has no wheezes. She has no rales.  Musculoskeletal: Normal range of motion.  Lower extremity varicosities  Neurological: She is alert and oriented to person, place, and time. She has normal reflexes. Coordination normal.  Skin: Skin is warm and dry.  Psychiatric: She has a normal mood and affect. Her behavior is normal. Judgment and thought content normal.       Assessment & Plan:   1. Leg pain, bilateral Excellent pulses lower extremities. Will send to have doppler to evaluate for small clot although doubtful since she is on coumadin. Pain can also be coming from varicosities. She has large varicose veins. - Lower Extremity Venous Duplex Bilateral; Future  2. Gastroesophageal reflux disease, esophagitis presence not  specified Start Prilosec to see if this helps her symptoms. If no improvement within 1 month may need referral to GI.  3. Need for prophylactic vaccination against Streptococcus pneumoniae (pneumococcus) Received in clinic - Prevnar - Pneumococcal conjugate vaccine 13-valent

## 2014-03-11 ENCOUNTER — Encounter: Payer: Self-pay | Admitting: Cardiovascular Disease

## 2014-03-11 ENCOUNTER — Encounter: Payer: Self-pay | Admitting: *Deleted

## 2014-03-11 ENCOUNTER — Telehealth: Payer: Self-pay

## 2014-03-11 ENCOUNTER — Emergency Department: Payer: Self-pay | Admitting: Emergency Medicine

## 2014-03-11 LAB — CBC
HCT: 49.3 % — AB (ref 35.0–47.0)
HGB: 16.4 g/dL — AB (ref 12.0–16.0)
MCH: 31.5 pg (ref 26.0–34.0)
MCHC: 33.3 g/dL (ref 32.0–36.0)
MCV: 95 fL (ref 80–100)
Platelet: 183 10*3/uL (ref 150–440)
RBC: 5.21 10*6/uL — AB (ref 3.80–5.20)
RDW: 13.9 % (ref 11.5–14.5)
WBC: 5.1 10*3/uL (ref 3.6–11.0)

## 2014-03-11 LAB — PROTIME-INR
INR: 2.1
PROTHROMBIN TIME: 23.4 s — AB (ref 11.5–14.7)

## 2014-03-11 NOTE — Telephone Encounter (Signed)
-----   Message -----  From: [name]  Sent: 03/11/2014 1:27 PM EDT  To: Ida Rogue, MD  Subject: Non-Urgent Medical Question   At 12:45 this morning I was transported by the EMTs to the ER for nose bleed. When they checked my Coumadin level it was 2.1.   They partially packed the right nostril and I am now at home.   My question, since the Coumadin nurse is there I would like to know if my recently taking Gray for my cholesterol would cause this. That is the only change in my medications. The ER dr. advised me not to change my  Coumadin dosage.

## 2014-03-11 NOTE — Telephone Encounter (Signed)
Telephoned pt and informed her that per Dr Hedy Camara, Pharm D Red Yeast Rice will not cause increase in INR or the cause for nose bleed. She states she is suppose to follow up with ENT.

## 2014-03-18 ENCOUNTER — Ambulatory Visit (INDEPENDENT_AMBULATORY_CARE_PROVIDER_SITE_OTHER): Payer: Medicare Other | Admitting: Cardiovascular Disease

## 2014-03-18 ENCOUNTER — Encounter: Payer: Self-pay | Admitting: Cardiovascular Disease

## 2014-03-18 ENCOUNTER — Encounter (INDEPENDENT_AMBULATORY_CARE_PROVIDER_SITE_OTHER): Payer: Medicare Other

## 2014-03-18 VITALS — BP 128/86 | HR 81 | Ht 63.0 in | Wt 165.5 lb

## 2014-03-18 DIAGNOSIS — Z7901 Long term (current) use of anticoagulants: Secondary | ICD-10-CM

## 2014-03-18 DIAGNOSIS — I10 Essential (primary) hypertension: Secondary | ICD-10-CM

## 2014-03-18 DIAGNOSIS — R040 Epistaxis: Secondary | ICD-10-CM | POA: Insufficient documentation

## 2014-03-18 DIAGNOSIS — I4891 Unspecified atrial fibrillation: Secondary | ICD-10-CM

## 2014-03-18 DIAGNOSIS — F4321 Adjustment disorder with depressed mood: Secondary | ICD-10-CM

## 2014-03-18 DIAGNOSIS — E785 Hyperlipidemia, unspecified: Secondary | ICD-10-CM

## 2014-03-18 MED ORDER — DILTIAZEM HCL 30 MG PO TABS: 30.0000 mg | ORAL_TABLET | Freq: Three times a day (TID) | ORAL | Status: DC

## 2014-03-18 NOTE — Assessment & Plan Note (Signed)
Recent nosebleeds likely exacerbated by being on anticoagulation. We'll need to watch her closely for recurrent nosebleeds. She does have followup with ENT

## 2014-03-18 NOTE — Assessment & Plan Note (Signed)
Currently not taking her pravastatin. She had myalgias

## 2014-03-18 NOTE — Progress Notes (Signed)
Patient ID: Linda [name], female    DOB: [birth year], 80 y.o.   MRN: [id number]  HPI Comments: Linda [name] is a 80 year old woman with past medical history of chronic atrial fibrillation on warfarin, normal coronary arteries in September 2007,  previous nocturnal oximetry had showed some desaturations though she reports sleeping well and has no significant snoring or apnea, who presents for followup of her atrial fibrillation.   lost her husband in early 2015. She continues to have problems with adjusting She reports that her blood pressure has been running low frequently, sometimes systolic 97-989 She reports having recent nosebleed, requiring a trip to the emergency room and cautery with packing She is tired, feels weak. Sometimes does not sleep well She goes to the gym 3 days per week.  denies having orthostatic type symptoms   Lab work shows total cholesterol 174, LDL 89, HDL 68, hemoglobin A1c 6.3, creatinine 0.8 Recent lab work showing hemoglobin A1c 6.3, total cholesterol 180, LDL 97, creatinine 0.8, normal LFTs  EKG shows atrial fibrillation with ventricular rate 81 beats per minute, T-wave abnormality in leads  V4 through V6    Outpatient Encounter Prescriptions as of 03/18/2014  Medication Sig  . calcium carbonate (OS-CAL) 600 MG TABS Take 600 mg by mouth 2 (two) times daily with a meal.    . cholecalciferol (VITAMIN D) 400 UNITS TABS Take 2,000 Units by mouth daily.  Marland Kitchen co-enzyme Q-10 30 MG capsule Take 30 mg by mouth 2 (two) times daily.   Marland Kitchen COUMADIN 5 MG tablet TAKE 1 TABLET (5 MG TOTAL) DAILY AS DIRECTED BY ANTI  [facility]  . diltiazem (CARDIZEM CD) 120 MG 24 hr capsule Take 1 capsule (120 mg total) by mouth daily.  Marland Kitchen LORazepam (ATIVAN) 0.5 MG tablet Take 1/2 tablet daily as needed.  . Multiple Vitamins-Minerals ([facility]S SPECTRAVITE ADULT 50+ PO) Take by mouth.  . nitroGLYCERIN (NITROSTAT) 0.4 MG SL tablet Place 1 tablet (0.4 mg total) under the tongue every 5 (five) minutes as  needed.  Marland Kitchen omeprazole (PRILOSEC) 20 MG capsule Take 20 mg by mouth daily.  . Red Yeast Rice Extract (RED YEAST RICE PO) Take by mouth 2 (two) times daily.    Review of Systems  Constitutional: Positive for fatigue.  HENT: Negative.        Nosebleed  Eyes: Negative.   Respiratory: Negative.   Cardiovascular: Negative.   Gastrointestinal: Negative.   Endocrine: Negative.   Musculoskeletal: Negative.   Skin: Negative.   Allergic/Immunologic: Negative.   Neurological: Positive for weakness.  Hematological: Negative.   Psychiatric/Behavioral: Negative.   All other systems reviewed and are negative.   BP 128/86  Pulse 81  Ht 5\' 3"  (1.6 m)  Wt 165 lb 8 oz (75.07 kg)  BMI 29.32 kg/m2  Physical Exam  Nursing note and vitals reviewed. Constitutional: She is oriented to person, place, and time. She appears well-developed and well-nourished.  HENT:  Head: Normocephalic.  Nose: Nose normal.  Mouth/Throat: Oropharynx is clear and moist.  Eyes: Conjunctivae are normal. Pupils are equal, round, and reactive to light.  Neck: Normal range of motion. Neck supple. No JVD present.  Cardiovascular: Normal rate, S1 normal, S2 normal and intact distal pulses.  An irregularly irregular rhythm present. Exam reveals no gallop and no friction rub.   Murmur heard.  Crescendo systolic murmur is present with a grade of 2/6  Pulmonary/Chest: Effort normal and breath sounds normal. No respiratory distress. She has no wheezes. She has no rales.  She exhibits no tenderness.  Abdominal: Soft. Bowel sounds are normal. She exhibits no distension. There is no tenderness.  Musculoskeletal: Normal range of motion. She exhibits no edema and no tenderness.  Lymphadenopathy:    She has no cervical adenopathy.  Neurological: She is alert and oriented to person, place, and time. Coordination normal.  Skin: Skin is warm and dry. No rash noted. No erythema.  Psychiatric: She has a normal mood and affect. Her behavior  is normal. Judgment and thought content normal.    Assessment and Plan

## 2014-03-18 NOTE — Patient Instructions (Signed)
OK to hold the diltiazem 120 mg pill Please try the diltiazem 30 mg pills up to three times a day as needed for blood pressure and heart rate  Please call us if you have new issues that need to be addressed before your next appt.  Your physician wants you to follow-up in: 1 month. (november)

## 2014-03-18 NOTE — Assessment & Plan Note (Signed)
Tolerating anticoagulation. Recent nosebleeds. We'll watch her closely

## 2014-03-18 NOTE — Assessment & Plan Note (Signed)
Blood pressures running low. We have recommended she decrease the dose of diltiazem down to 30 mg 3 times a day, possibly even twice a day for persistent hypotension

## 2014-03-18 NOTE — Assessment & Plan Note (Signed)
She continues to have a difficult time after the loss of her husband. Tearful on her office visit today

## 2014-03-18 NOTE — Assessment & Plan Note (Addendum)
Heart rate well-controlled. She complains of low blood pressure, feeling weak at times. We have suggested she try diltiazem 30 mg 3 times a day while awake. She may tolerate even twice a day dosing if heart rate and blood pressure remained well controlled

## 2014-03-25 ENCOUNTER — Ambulatory Visit (INDEPENDENT_AMBULATORY_CARE_PROVIDER_SITE_OTHER): Payer: Medicare Other

## 2014-03-25 DIAGNOSIS — I4891 Unspecified atrial fibrillation: Secondary | ICD-10-CM

## 2014-03-25 DIAGNOSIS — Z5181 Encounter for therapeutic drug level monitoring: Secondary | ICD-10-CM

## 2014-03-25 LAB — POCT INR: INR: 2.4

## 2014-03-26 ENCOUNTER — Telehealth: Payer: Self-pay

## 2014-03-26 NOTE — Telephone Encounter (Signed)
Pt dropped off recent BP readings:  "Difficult to find a pattern to take meds. Why is pulse dropping low at times?"  10/1 8:30am 125/93 70  10:30am 127/95 74 Diltiazem 30  5:00pm 121/81 78 Diltiazem 30 10/2 6:30am 126/93 73 Diltiazem 30  12:15pm 123/64 46   3:30pm 128/75 40   5:00pm 121/92 78 Diltiazem 30 10/3 5:30am 136/94 80 Diltiazem 30  11:00am 124/80 41  12:30pm 136/98 51 Diltiazem 30  6:15pm 126/74 75   2:45am 142/99 74 Diltiazem 30 10/4 6:00am 131/95 75 Diltiazem 30   1:00pm 145/83 48 Diltiazem 30  6:00pm 97/72 77  8:30pm 105/69 69 10/5 1:00am 130/94 77 Diltiazem 30  7:00am 131/88 44  9:00am 113/76 78   12:00pm 136/84 46 Diltiazem 30  6:30pm 126/84 78 10/6 2:00am 134/94 72 Diltiazem 30  8:00am 120/85 85   11:00am 137/95 69 Diltiazem 30  4:30pm 122/93 80   8:30pm 134/82 55 Diltiazem 30 10/7 5:00am 136/96 82 Diltiazem 30  Spoke w/ Dr. Rockey Situ regarding these readings.  Advised pt of Dr. Donivan Scull recommendations. He advises to pt NOT to check her BP so often, no more than 3 days a week, unless she is symptomatic. Advised her to try to take her regular meds at the same time each day and not to adjust the timing. Advised her that if HR is low, to recheck on her other arm and/or check manually, as this is most likely error.  She verbalizes understanding and will call back w/ any further questions or concerns.

## 2014-03-27 ENCOUNTER — Telehealth: Payer: Self-pay

## 2014-03-27 NOTE — Telephone Encounter (Signed)
Blood pressure and heart rate numbers in a reasonable range Would probably take diltiazem 30 mg twice per day Would not cut up into little pieces as this gets tedious  In terms of the echocardiogram, there were findings in 2012 I do not expect there to be dramatic change in 3 years that would change what we do. If you would like to look for any significant changes, certainly echocardiogram could be done. Based on her physical exam, suspect it would look approximately the same

## 2014-03-27 NOTE — Telephone Encounter (Signed)
Pt presented to office w/ list of questions: "Did he every check my chart to see if I need an echocardiogram? BP has remained steady around 117-128/78, but pulse is usually high in the 80s. Is this A/F caused? Have not taken but 30 per day or min of 2 30mg  each day.   Does this affect pulse? Should I cut in half and take 4 per day: 1am, noon, 4:00 & bedtime?"  BP on her monitor today: 127/85, 79 Manually: 122/78  Reports that she is no longer taking a statin, as her PCP stopped this due to leg cramps. She is currently talking Red Yeast Rice.  She states that she is unsure when to take her diltiazem,as she previously only took it based on her BP and she is not taking this as often.

## 2014-03-27 NOTE — Telephone Encounter (Signed)
Left detailed message on pt's voicemail. Asked pt to call back w/ any questions or concerns.

## 2014-03-29 ENCOUNTER — Telehealth: Payer: Self-pay | Admitting: Internal Medicine

## 2014-03-29 DIAGNOSIS — R609 Edema, unspecified: Secondary | ICD-10-CM

## 2014-03-29 NOTE — Telephone Encounter (Signed)
Raquel must have ordered ultrasoudn of legs.  I have reviewed the repoort, which notes the following:  prior bilateral great saphenous vein stripping  both legs have varicose veins causing edema) No DVTS   She needs to wear compression stockings regularly

## 2014-03-30 NOTE — Telephone Encounter (Signed)
Sent mychart message with results

## 2014-04-15 ENCOUNTER — Encounter: Payer: Self-pay | Admitting: Internal Medicine

## 2014-04-15 NOTE — Telephone Encounter (Signed)
This encounter was created in error - please disregard.

## 2014-04-21 ENCOUNTER — Encounter: Payer: Self-pay | Admitting: Cardiovascular Disease

## 2014-04-22 ENCOUNTER — Telehealth: Payer: Self-pay | Admitting: *Deleted

## 2014-04-22 NOTE — Telephone Encounter (Signed)
Calling back stating that her bp is now 167/101. Upset no one has called her yet.

## 2014-04-22 NOTE — Telephone Encounter (Signed)
Spoke w/ pt.  She reports her BP over the past few days, reports they are "probably on different days": 124/81, 45 124/81, 44 118/76, 46 100/72, 43 114/75, 49 Reports BP since last night:  167/101 155/100 150/114 She took a diltiazem 30 mg at noon and is wondering if she should take another one.  Pt is quite anxious "I don't want to have a stroke". Pt states that she realizes we have advised her multiple times to limit the amount of times per day she checks her BP, she states that she does not know if she should take her diltiazem unless she checks her BP.  Please advise.  Thank you.

## 2014-04-22 NOTE — Telephone Encounter (Signed)
Please call patient. She has had lbp 106/71 then last night went to hbp 155/99. Please call her.

## 2014-04-22 NOTE — Telephone Encounter (Signed)
Risha M Fisch   To  Minna Merritts, MD   Sent  04/21/2014 12:06 PM      Please advise if I should take the 30 mg's of the Diltiazem three times a day even if my B/P is running low. These last few days it has been from 100/ 68 to 117/79 with the pulse 49, 41 . I do have another appointment with the Dr. on the 17th of Nov..   Please advise via MyChart. Thanks, Arty Baumgartner. [employer]

## 2014-04-22 NOTE — Telephone Encounter (Signed)
Pt states that she did not receive the e-mail. She also wanted to ask Is Afib the cause of her high BP. Her blood pressure cuff keeps reading and irregular heart beat. Please advise.

## 2014-04-23 NOTE — Telephone Encounter (Signed)
Before we make changes with her meds, We need to make sure heart rate is accurate. It seems a little low. Does she have an alternate blood pressure cuff that she could use If heart rate really is in the 40s, we would hold the diltiazem and use an alternate medication for blood pressure  Previous EKGs, heart rate typically 70-80 If she is concerned about low blood pressure, would hold the diltiazem and takes only twice a day Okay to take extra diltiazem as needed. It is a 4-6 hour medication

## 2014-04-23 NOTE — Telephone Encounter (Signed)
Left message on pt's vm that I am sending Dr. Donivan Scull recommendation via [place].  Asked her to call w/ any questions or concerns.

## 2014-04-24 NOTE — Telephone Encounter (Signed)
Left message for pt to call back  °

## 2014-04-24 NOTE — Telephone Encounter (Signed)
Spoke w/ pt.  She reports that she has a new wrist BP cuff that she has been alternating w/ her reg arm cuff. Reports her BP at 3 am 148/106 on her reg cuff, 158/96 HR 51 on wrist monitor. She took a diltiazem 30 mg. At 8 am, 106/73 HR 47 arm, 116/76 HR 77 wrist. She would like to know if she should go back on diltiazem 20 mg.  She is concerned that she is not over her husband's passing and she is concerned that her sx are r/t anxiety. Asked if pt had spoken w/ her PCP about antianxiety meds, she reports that she was prescribed lorazepam 0.5 mg, but she has not taken, as she is concerned about addiction.  Had lengthy discussion w/ pt about this and she is agreeable to taking lorazepam as prescribed.

## 2014-04-26 NOTE — Telephone Encounter (Signed)
If she would like, we could go back to the 120 mg diltiazem once daily I am concerning that her cuffs may not be picking up the right heart rate. We could compare her cuffs (heart rate ) to our ekg rate and check BP (nurse visit)

## 2014-04-27 NOTE — Telephone Encounter (Signed)
Dr. Donivan Scull recommendation sent via [place].

## 2014-05-05 ENCOUNTER — Encounter: Payer: Self-pay | Admitting: Cardiovascular Disease

## 2014-05-05 ENCOUNTER — Ambulatory Visit (INDEPENDENT_AMBULATORY_CARE_PROVIDER_SITE_OTHER): Payer: Medicare Other | Admitting: Pharmacist Clinician (PhC)/ Clinical Pharmacy Specialist

## 2014-05-05 ENCOUNTER — Ambulatory Visit (INDEPENDENT_AMBULATORY_CARE_PROVIDER_SITE_OTHER): Payer: Medicare Other | Admitting: Cardiovascular Disease

## 2014-05-05 VITALS — BP 128/100 | HR 78 | Ht 63.0 in | Wt 167.4 lb

## 2014-05-05 DIAGNOSIS — R609 Edema, unspecified: Secondary | ICD-10-CM

## 2014-05-05 DIAGNOSIS — Z7901 Long term (current) use of anticoagulants: Secondary | ICD-10-CM

## 2014-05-05 DIAGNOSIS — I4891 Unspecified atrial fibrillation: Secondary | ICD-10-CM

## 2014-05-05 DIAGNOSIS — Z5181 Encounter for therapeutic drug level monitoring: Secondary | ICD-10-CM

## 2014-05-05 DIAGNOSIS — E785 Hyperlipidemia, unspecified: Secondary | ICD-10-CM

## 2014-05-05 DIAGNOSIS — I10 Essential (primary) hypertension: Secondary | ICD-10-CM

## 2014-05-05 LAB — POCT INR: INR: 2.8

## 2014-05-05 NOTE — Assessment & Plan Note (Signed)
Labile blood pressure likely secondary to anxiety. Seems to be doing well on very low-dose diltiazem medication. Not on any other blood pressure medications at this time Suggested she use her blood pressure cuff to do periodic monitoring

## 2014-05-05 NOTE — Progress Notes (Signed)
Patient ID: Linda [name], female    DOB: May 08, 1934, 80 y.o.   MRN: [id number]  HPI Comments: Linda [name] is a 80 year old woman with past medical history of chronic atrial fibrillation on warfarin, normal coronary arteries in September 2007,  previous nocturnal oximetry had showed some desaturations though she reports sleeping well and has no significant snoring or apnea, who presents for followup of her atrial fibrillation and blood pressure management  In general she has been doing well. She is not taking diltiazem 120 mg daily and has been taking diltiazem 30 mg approximately once per day over the past several weeks Occasionally has very low heart rates using her blood pressure cuff. Suspect this is a problem with her blood pressure cuff and not bradycardia Typically heart rates in the office have been in the 70s and 80s She denies any significant lower extremity edema, no tachycardia or palpitations Blood pressure has been labile but in general 956 up to 387 systolic Continues to have problems with anxiety, insomnia, continues to feel tired Previously was going to the gym 3 days per week. No orthostasis symptoms  EKG not performed today  Other past medical history lost her husband in early 2015. She continues to have problems with adjusting Previous nosebleed requiring packing  Lab work shows total cholesterol 174, LDL 89, HDL 68, hemoglobin A1c 6.3, creatinine 0.8 Recent lab work showing hemoglobin A1c 6.3, total cholesterol 180, LDL 97, creatinine 0.8, normal LFTs   Outpatient Encounter Prescriptions as of 05/05/2014  Medication Sig  . calcium carbonate (OS-CAL) 600 MG TABS Take 600 mg by mouth 2 (two) times daily with a meal.    . cholecalciferol (VITAMIN D) 400 UNITS TABS Take 2,000 Units by mouth daily.  Marland Kitchen co-enzyme Q-10 30 MG capsule Take 30 mg by mouth 2 (two) times daily.   Marland Kitchen COUMADIN 5 MG tablet TAKE 1 TABLET (5 MG TOTAL) DAILY AS DIRECTED BY ANTI  [facility]  . diltiazem  (CARDIZEM) 30 MG tablet Take 1 tablet (30 mg total) by mouth 3 (three) times daily.  Marland Kitchen LORazepam (ATIVAN) 0.5 MG tablet Take 1/2 tablet daily as needed.  . Multiple Vitamins-Minerals ([facility]S SPECTRAVITE ADULT 50+ PO) Take by mouth.  . nitroGLYCERIN (NITROSTAT) 0.4 MG SL tablet Place 1 tablet (0.4 mg total) under the tongue every 5 (five) minutes as needed.  Marland Kitchen omeprazole (PRILOSEC) 20 MG capsule Take 20 mg by mouth daily.  . Red Yeast Rice Extract (RED YEAST RICE PO) Take by mouth 2 (two) times daily.   Social history  reports that she has never smoked. She has never used smokeless tobacco. She reports that she does not drink alcohol or use illicit drugs.  Review of Systems  Constitutional: Positive for fatigue.  HENT: Negative.        Nosebleed  Eyes: Negative.   Respiratory: Negative.   Cardiovascular: Negative.   Gastrointestinal: Negative.   Endocrine: Negative.   Musculoskeletal: Negative.   Neurological: Positive for weakness.  Hematological: Negative.   Psychiatric/Behavioral: Positive for dysphoric mood.  All other systems reviewed and are negative.  BP 128/100 mmHg  Pulse 78  Ht 5\' 3"  (1.6 m)  Wt 167 lb 6.4 oz (75.932 kg)  BMI 29.66 kg/m2   Physical Exam  Constitutional: She is oriented to person, place, and time. She appears well-developed and well-nourished.  HENT:  Head: Normocephalic.  Nose: Nose normal.  Mouth/Throat: Oropharynx is clear and moist.  Eyes: Conjunctivae are normal. Pupils are equal, round, and reactive  to light.  Neck: Normal range of motion. Neck supple. No JVD present.  Cardiovascular: Normal rate, regular rhythm, S1 normal, S2 normal, normal heart sounds and intact distal pulses.  An irregularly irregular rhythm present. Exam reveals no gallop and no friction rub.   No murmur heard. Pulmonary/Chest: Effort normal and breath sounds normal. No respiratory distress. She has no wheezes. She has no rales. She exhibits no tenderness.  Abdominal: Soft.  Bowel sounds are normal. She exhibits no distension. There is no tenderness.  Musculoskeletal: Normal range of motion. She exhibits no edema or tenderness.  Lymphadenopathy:    She has no cervical adenopathy.  Neurological: She is alert and oriented to person, place, and time. Coordination normal.  Skin: Skin is warm and dry. No rash noted. No erythema.  Psychiatric: She has a normal mood and affect. Her behavior is normal. Judgment and thought content normal.    Assessment and Plan  Nursing note and vitals reviewed.

## 2014-05-05 NOTE — Assessment & Plan Note (Addendum)
Seems to be tolerating only low-dose diltiazem 30 mg doses as needed. Suggested she use her pulse meter on her finger to monitor her heart rate take daily if she has symptoms.  Tolerating anticoagulation. INR will be checked today

## 2014-05-05 NOTE — Assessment & Plan Note (Signed)
Minimal edema on today's visit, consistent with venous insufficiency

## 2014-05-05 NOTE — Assessment & Plan Note (Signed)
She has stopped lovastatin, started red yeast rice. This is her choice

## 2014-05-05 NOTE — Patient Instructions (Signed)
You are doing well. No medication changes were made.  Please continue to use the diltiazem 30 mg pills  Please call us if you have new issues that need to be addressed before your next appt.  Your physician wants you to follow-up in: 6 months.  You will receive a reminder letter in the mail two months in advance. If you don't receive a letter, please call our office to schedule the follow-up appointment.

## 2014-05-08 ENCOUNTER — Encounter: Payer: Self-pay | Admitting: Adult Health

## 2014-05-15 ENCOUNTER — Emergency Department: Payer: Self-pay | Admitting: Emergency Medicine

## 2014-05-15 LAB — CBC
HCT: 46.1 % (ref 35.0–47.0)
HGB: 15.4 g/dL (ref 12.0–16.0)
MCH: 31.5 pg (ref 26.0–34.0)
MCHC: 33.5 g/dL (ref 32.0–36.0)
MCV: 94 fL (ref 80–100)
Platelet: 138 10*3/uL — ABNORMAL LOW (ref 150–440)
RBC: 4.91 10*6/uL (ref 3.80–5.20)
RDW: 13.3 % (ref 11.5–14.5)
WBC: 5 10*3/uL (ref 3.6–11.0)

## 2014-05-15 LAB — BASIC METABOLIC PANEL
Anion Gap: 7 (ref 7–16)
BUN: 17 mg/dL (ref 7–18)
CHLORIDE: 99 mmol/L (ref 98–107)
CO2: 26 mmol/L (ref 21–32)
Calcium, Total: 8.4 mg/dL — ABNORMAL LOW (ref 8.5–10.1)
Creatinine: 0.86 mg/dL (ref 0.60–1.30)
EGFR (African American): >60
EGFR (Non-African Amer.): >60
Glucose: 126 mg/dL — ABNORMAL HIGH (ref 65–99)
Osmolality: 268 (ref 275–301)
Potassium: 4.1 mmol/L (ref 3.5–5.1)
Sodium: 132 mmol/L — ABNORMAL LOW (ref 136–145)

## 2014-05-15 LAB — TROPONIN I: Troponin-I: <0.02 ng/mL

## 2014-05-26 ENCOUNTER — Telehealth: Payer: Self-pay | Admitting: Adult Health

## 2014-05-26 ENCOUNTER — Ambulatory Visit (INDEPENDENT_AMBULATORY_CARE_PROVIDER_SITE_OTHER): Payer: Medicare Other | Admitting: Internal Medicine

## 2014-05-26 ENCOUNTER — Encounter: Payer: Self-pay | Admitting: Internal Medicine

## 2014-05-26 VITALS — BP 140/80 | HR 59 | Temp 98.2°F | Resp 16 | Ht 63.5 in | Wt 159.5 lb

## 2014-05-26 DIAGNOSIS — K2970 Gastritis, unspecified, without bleeding: Secondary | ICD-10-CM

## 2014-05-26 DIAGNOSIS — J40 Bronchitis, not specified as acute or chronic: Secondary | ICD-10-CM

## 2014-05-26 DIAGNOSIS — R634 Abnormal weight loss: Secondary | ICD-10-CM

## 2014-05-26 DIAGNOSIS — E785 Hyperlipidemia, unspecified: Secondary | ICD-10-CM

## 2014-05-26 DIAGNOSIS — Z5181 Encounter for therapeutic drug level monitoring: Secondary | ICD-10-CM

## 2014-05-26 DIAGNOSIS — K2990 Gastroduodenitis, unspecified, without bleeding: Secondary | ICD-10-CM

## 2014-05-26 DIAGNOSIS — J209 Acute bronchitis, unspecified: Secondary | ICD-10-CM

## 2014-05-26 DIAGNOSIS — F4321 Adjustment disorder with depressed mood: Secondary | ICD-10-CM

## 2014-05-26 LAB — COMPREHENSIVE METABOLIC PANEL
ALK PHOS: 50 U/L (ref 39–117)
ALT: 17 U/L (ref 0–35)
AST: 22 U/L (ref 0–37)
Albumin: 3.4 g/dL — ABNORMAL LOW (ref 3.5–5.2)
BUN: 20 mg/dL (ref 6–23)
CO2: 30 mEq/L (ref 19–32)
Calcium: 9.3 mg/dL (ref 8.4–10.5)
Chloride: 101 mEq/L (ref 96–112)
Creatinine, Ser: 0.8 mg/dL (ref 0.4–1.2)
GFR: 72.58 mL/min (ref >60.00)
Glucose, Bld: 125 mg/dL — ABNORMAL HIGH (ref 70–99)
POTASSIUM: 3.9 meq/L (ref 3.5–5.1)
Sodium: 138 mEq/L (ref 135–145)
TOTAL PROTEIN: 6.6 g/dL (ref 6.0–8.3)
Total Bilirubin: 0.7 mg/dL (ref 0.2–1.2)

## 2014-05-26 LAB — CBC WITH DIFFERENTIAL/PLATELET
Basophils Absolute: 0 10*3/uL (ref 0.0–0.1)
Basophils Relative: 0.9 % (ref 0.0–3.0)
EOS ABS: 0.1 10*3/uL (ref 0.0–0.7)
EOS PCT: 1.4 % (ref 0.0–5.0)
HCT: 44.1 % (ref 36.0–46.0)
Hemoglobin: 14.6 g/dL (ref 12.0–15.0)
Lymphocytes Relative: 25.6 % (ref 12.0–46.0)
Lymphs Abs: 1.3 10*3/uL (ref 0.7–4.0)
MCHC: 33.1 g/dL (ref 30.0–36.0)
MCV: 92.8 fl (ref 78.0–100.0)
MONO ABS: 0.5 10*3/uL (ref 0.1–1.0)
Monocytes Relative: 10.2 % (ref 3.0–12.0)
NEUTROS ABS: 3.2 10*3/uL (ref 1.4–7.7)
Neutrophils Relative %: 61.9 % (ref 43.0–77.0)
Platelets: 285 10*3/uL (ref 150.0–400.0)
RBC: 4.75 Mil/uL (ref 3.87–5.11)
RDW: 13.6 % (ref 11.5–15.5)
WBC: 5.1 10*3/uL (ref 4.0–10.5)

## 2014-05-26 LAB — PROTIME-INR
INR: 1.4 ratio — ABNORMAL HIGH (ref 0.8–1.0)
Prothrombin Time: 15.2 s — ABNORMAL HIGH (ref 9.6–13.1)

## 2014-05-26 LAB — H. PYLORI ANTIBODY, IGG: H Pylori IgG: NEGATIVE

## 2014-05-26 MED ORDER — PREDNISONE (PAK) 10 MG PO TABS: ORAL_TABLET | ORAL | Status: DC

## 2014-05-26 MED ORDER — MIRTAZAPINE 15 MG PO TBDP
7.5000 mg | ORAL_TABLET | Freq: Every day | ORAL | Status: DC
Start: 2014-05-26 — End: 2014-06-24

## 2014-05-26 MED ORDER — OMEPRAZOLE 40 MG PO CPDR: 40.0000 mg | DELAYED_RELEASE_CAPSULE | Freq: Every day | ORAL | Status: DC

## 2014-05-26 NOTE — Patient Instructions (Addendum)
I am treating your eustachian tube dysfunction with a prednisone taper and OTc sudafed PE 9take the sudafed once in the am and once in the early afternoon,  10 mg )  The prednisone is taken in decreasing doses daily until gone ( 60 mg ,  Then 50 mg , then 40 mg , etc)  I am recommending a trial of mirtazipine (generic for Remeron) to help your appetite and your insomnia. Start with 7.5 mg daily one hour before bedtime .  Increase to 15 mg after one week Do not take your lorazepam unless you are still awake after 1 hour   Please resume the stomach medicine daily in the morning,  i will send an rx to your pharamacy  If the appetite and pain does not improve over the next month,  I will refer you ot Dr Vira Agar to evalua to you for an ulcer    We are checking INR today;  You should get it repeated next week at Dr Donivan Scull office since we added medications today

## 2014-05-26 NOTE — Telephone Encounter (Signed)
Linda [name],  4 week follow up not 2 week

## 2014-05-26 NOTE — Progress Notes (Signed)
Patient ID: Linda [name], female   DOB: 1934-03-23, 80 y.o.   MRN: [id number] Patient Active Problem List   Diagnosis Date Noted  . Gastritis and gastroduodenitis 05/26/2014  . Tracheobronchitis 05/26/2014  . Nosebleed 03/18/2014  . GERD (gastroesophageal reflux disease) 03/04/2014  . Medicare annual wellness visit, subsequent 01/13/2014  . Essential hypertension 11/04/2013  . Grief 10/29/2013  . Encounter for therapeutic drug monitoring 07/16/2013  . Screening for breast cancer 07/01/2013  . Abdominal pain, left lower quadrant 07/01/2013  . Left shoulder pain 03/21/2013  . Hoarseness 03/21/2013  . Long term (current) use of anticoagulants 09/28/2010  . Edema 09/05/2010  . VENTRICULAR HYPERTROPHY, LEFT 08/16/2010  . Hyperlipidemia 06/03/2010  . ATRIAL FIBRILLATION 08/13/2009  . PREMATURE VENTRICULAR CONTRACTIONS 08/13/2009    Subjective:  CC:   Chief Complaint  Patient presents with  . Follow-up    9/ 23 /15 had severe nose bleed was packed and cauterized.  . Cough    congestion in throat, non productive, aritho 250 mg daily, hydrocodone cough medication and cough perles.  Linda [name] Reflux    patient staed was started on omeprazole for stomach pain but has not helped.    HPI:   Linda [name] is a 80 y.o. female who presents for follow up on Multiple issues,  Last seen by RR in Septembrer  - 1) Treated for tracheobreonchitis with azithro tussionex  And tessalon URi by [facility] on Nov 26 . Went to ER following day for   X ray was negative for PNA. .  ER added albuterol MDI .Currenlty reports left ear popping, coughing is less frequent,  None at night,  No production ,  Cough is Mostly in the am.  No diarrhea.  Was not told to take a probiotic, did not use sudafed PE or prednisone  2) was treated for gastritis several months ago with omeprazole for a month,  sympotms resolved, but once she stopped thr PPI the abd pian recurred the pain recurred several weeks later,   Sharp pain present when hungry,  Woke her up one night .  No nuasea.  Has lost her appetite, and losing weight   16 lbs since February,   Last EGD was done at time of colonoscopy by Linda [name] several  Years ago,  For history o fGERD.  3) weight loss ,  Husband of 71 years died in 18-Sep-2022 after 5 months of progressive deterioration,  due to DM and CHF.  She was primary caregiver. She is tearful today,  Has two grwon children and three grown grandchildren but does not feel clsoe to daughter,  Has no definite plans for christmas.  No pets.  Several short trips with widowed friends.       Past Medical History  Diagnosis Date  . Arrhythmia     paroxysmal atrial fibrillation  . PVC (premature ventricular contraction)   . Diverticulitis   . Hyperlipidemia   . Kidney stone   . Colon polyp   . Parathyroid disease     Parathyroidectomy   . UTI (lower urinary tract infection)     Past Surgical History  Procedure Laterality Date  . Tonsillectomy  1956  . Vein ligation and stripping    . Cholecystectomy  2002  . Appendectomy  1984  . Total abdominal hysterectomy w/ bilateral salpingoophorectomy  1984  . Rotator cuff repair  2002  . Parathyroidectomy  2004    1 removed       The following portions of  the patient's history were reviewed and updated as appropriate: Allergies, current medications, and problem list.    Review of Systems:   Patient denies headache, fevers, malaise, unintentional weight loss, skin rash, eye pain, sinus congestion and sinus pain, sore throat, dysphagia,  hemoptysis , cough, dyspnea, wheezing, chest pain, palpitations, orthopnea, edema, abdominal pain, nausea, melena, diarrhea, constipation, flank pain, dysuria, hematuria, urinary  Frequency, nocturia, numbness, tingling, seizures,  Focal weakness, Loss of consciousness,  Tremor, insomnia, depression, anxiety, and suicidal ideation.     History   Social History  . Marital Status: Married    Spouse Name: Linda [name]     Number of Children: 2  . Years of Education: 12   Occupational History  . Accounting     Retired   Social History Main Topics  . Smoking status: Never Smoker   . Smokeless tobacco: Never Used  . Alcohol Use: No  . Drug Use: No  . Sexual Activity: Not on file   Other Topics Concern  . Not on file   Social History Narrative   Linda [name] is from [facility] and grew [place] farm. Recently widowed. She was married to Linda [name], her husband for 59 years. They have a daughter and a son. She enjoys gardening.    Objective:  Filed Vitals:   05/26/14 1330  BP: 140/80  Pulse: 59  Temp: 98.2 F (36.8 C)  Resp: 16     General appearance: alert, cooperative and appears stated age Ears: normal TM's and external ear canals both ears Throat: lips, mucosa, and tongue normal; teeth and gums normal Neck: no adenopathy, no carotid bruit, supple, symmetrical, trachea midline and thyroid not enlarged, symmetric, no tenderness/mass/nodules Back: symmetric, no curvature. ROM normal. No CVA tenderness. Lungs: clear to auscultation bilaterally Heart: regular rate and rhythm, S1, S2 normal, no murmur, click, rub or gallop Abdomen: soft, non-tender; bowel sounds normal; no masses,  no organomegaly Pulses: 2+ and symmetric Skin: Skin color, texture, turgor normal. No rashes or lesions Lymph nodes: Cervical, supraclavicular, and axillary nodes normal.  Assessment and Plan:  Gastritis and gastroduodenitis Recurrent ,  Will resume omeprazole at 40 mg  Daily ,  Checking H pylori today.   If no improvement in one month refer to Linda [name] Long discuss today about her current state of grief.  Son and daughter are in [place] but not attentive on a daily or even weekly basis.  Has multiple widowed friends who provide emotional support,   Trial of  remeron to address loss of appetite,  And insomnia.  Return in one month   Tracheobronchitis Adding prednisone taper and sudafed PE for eustachian tube  dysfunction   A total of 40 minutes was spent with patient more than half of which was spent in counseling patient on the above mentioned issues , reviewing and explaining recent labs and imaging studies done, and coordination of care.   Updated Medication List Outpatient Encounter Prescriptions as of 05/26/2014  Medication Sig  . benzonatate (TESSALON) 100 MG capsule Take 100 mg by mouth 3 (three) times daily.  . chlorpheniramine-HYDROcodone (TUSSIONEX) 10-8 MG/5ML LQCR Take 5 mLs by mouth 2 (two) times daily as needed.  . cholecalciferol (VITAMIN D) 400 UNITS TABS Take 2,000 Units by mouth daily.  Marland Kitchen co-enzyme Q-10 30 MG capsule Take 30 mg by mouth 2 (two) times daily.   Marland Kitchen COUMADIN 5 MG tablet TAKE 1 TABLET (5 MG TOTAL) DAILY AS DIRECTED BY ANTI  COAG CLINIC  .  diltiazem (CARDIZEM) 30 MG tablet Take 1 tablet (30 mg total) by mouth 3 (three) times daily.  Marland Kitchen LORazepam (ATIVAN) 0.5 MG tablet Take 1/2 tablet daily as needed.  . nitroGLYCERIN (NITROSTAT) 0.4 MG SL tablet Place 1 tablet (0.4 mg total) under the tongue every 5 (five) minutes as needed.  Marland Kitchen PROAIR HFA 108 (90 BASE) MCG/ACT inhaler Take 2 puffs by mouth 4 (four) times daily as needed.  . Red Yeast Rice Extract (RED YEAST RICE PO) Take by mouth 2 (two) times daily.  . mirtazapine (REMERON SOL-TAB) 15 MG disintegrating tablet Take 0.5 tablets (7.5 mg total) by mouth at bedtime. For one week,  Then a full tablet thereafter  . omeprazole (PRILOSEC) 40 MG capsule Take 1 capsule (40 mg total) by mouth daily.  . predniSONE (STERAPRED UNI-PAK) 10 MG tablet 6 tablets on Day 1 , then reduce by 1 tablet daily until gone     Orders Placed This Encounter  Procedures  . H. pylori antibody, IgG  . T4 AND TSH  . Comp Met (CMET)  . CBC with Differential  . INR/PT    No Follow-up on file.

## 2014-05-26 NOTE — Progress Notes (Signed)
Pre-visit discussion using our clinic review tool. No additional management support is needed unless otherwise documented below in the visit note.  

## 2014-05-26 NOTE — Assessment & Plan Note (Signed)
Long discuss today about her current state of grief.  Son and daughter are in [place] but not attentive on a daily or even weekly basis.  Has multiple widowed friends who provide emotional support,   Trial of  remeron to address loss of appetite,  And insomnia.  Return in one month

## 2014-05-26 NOTE — Telephone Encounter (Signed)
Can patient follow up with you or can I put her Lorane Gell NP schedule.

## 2014-05-26 NOTE — Assessment & Plan Note (Signed)
Recurrent ,  Will resume omeprazole at 40 mg  Daily ,  Checking H pylori today.   If no improvement in one month refer to Dr Vira Agar

## 2014-05-26 NOTE — Assessment & Plan Note (Signed)
Adding prednisone taper and sudafed PE for eustachian tube dysfunction

## 2014-05-26 NOTE — Telephone Encounter (Signed)
Pt needs 2wk follow up appt. Please advise where to add to schedule/msn

## 2014-05-27 LAB — T4 AND TSH
T4 TOTAL: 5.5 ug/dL (ref 4.5–12.0)
TSH: 1.2 u[IU]/mL (ref 0.450–4.500)

## 2014-05-27 NOTE — Telephone Encounter (Signed)
Please schedule with NP 

## 2014-05-28 ENCOUNTER — Ambulatory Visit (INDEPENDENT_AMBULATORY_CARE_PROVIDER_SITE_OTHER): Payer: Medicare Other | Admitting: Internal Medicine

## 2014-05-28 DIAGNOSIS — I4891 Unspecified atrial fibrillation: Secondary | ICD-10-CM

## 2014-05-28 DIAGNOSIS — Z5181 Encounter for therapeutic drug level monitoring: Secondary | ICD-10-CM

## 2014-06-03 ENCOUNTER — Ambulatory Visit (INDEPENDENT_AMBULATORY_CARE_PROVIDER_SITE_OTHER): Payer: Medicare Other

## 2014-06-03 DIAGNOSIS — Z5181 Encounter for therapeutic drug level monitoring: Secondary | ICD-10-CM

## 2014-06-03 DIAGNOSIS — Z7901 Long term (current) use of anticoagulants: Secondary | ICD-10-CM

## 2014-06-03 DIAGNOSIS — I4891 Unspecified atrial fibrillation: Secondary | ICD-10-CM

## 2014-06-03 LAB — POCT INR: INR: 2.1

## 2014-06-16 ENCOUNTER — Encounter: Payer: Self-pay | Admitting: Cardiovascular Disease

## 2014-06-19 ENCOUNTER — Encounter: Payer: Self-pay | Admitting: Cardiovascular Disease

## 2014-06-24 ENCOUNTER — Ambulatory Visit (INDEPENDENT_AMBULATORY_CARE_PROVIDER_SITE_OTHER): Payer: Medicare Other

## 2014-06-24 DIAGNOSIS — Z5181 Encounter for therapeutic drug level monitoring: Secondary | ICD-10-CM

## 2014-06-24 DIAGNOSIS — I4891 Unspecified atrial fibrillation: Secondary | ICD-10-CM

## 2014-06-24 DIAGNOSIS — Z7901 Long term (current) use of anticoagulants: Secondary | ICD-10-CM

## 2014-06-24 LAB — POCT INR: INR: 2.3

## 2014-06-25 ENCOUNTER — Ambulatory Visit: Payer: Medicare Other | Admitting: Nurse Practitioner

## 2014-07-08 ENCOUNTER — Telehealth: Payer: Self-pay | Admitting: Internal Medicine

## 2014-07-08 DIAGNOSIS — Z13820 Encounter for screening for osteoporosis: Secondary | ICD-10-CM

## 2014-07-08 NOTE — Telephone Encounter (Signed)
I do not manage her coumadin.  The coumadin clinic does.    She needs no additional labs from me.   I have ordered the DEXA

## 2014-07-08 NOTE — Telephone Encounter (Signed)
Patient had CBC CMET TSH T4 in December does need any others?

## 2014-07-08 NOTE — Telephone Encounter (Signed)
Pt request to have blood work done and to have a bone density scheduled. Please advise pt/msn

## 2014-07-09 ENCOUNTER — Ambulatory Visit: Payer: Medicare Other | Admitting: Internal Medicine

## 2014-07-09 NOTE — Telephone Encounter (Signed)
Patient notified and voiced understanding.

## 2014-07-22 ENCOUNTER — Ambulatory Visit (INDEPENDENT_AMBULATORY_CARE_PROVIDER_SITE_OTHER): Payer: Medicare Other

## 2014-07-22 DIAGNOSIS — Z7901 Long term (current) use of anticoagulants: Secondary | ICD-10-CM

## 2014-07-22 DIAGNOSIS — I4891 Unspecified atrial fibrillation: Secondary | ICD-10-CM

## 2014-07-22 DIAGNOSIS — Z5181 Encounter for therapeutic drug level monitoring: Secondary | ICD-10-CM

## 2014-07-22 LAB — POCT INR: INR: 3.7

## 2014-07-28 ENCOUNTER — Encounter: Payer: Self-pay | Admitting: Internal Medicine

## 2014-07-28 DIAGNOSIS — Z87898 Personal history of other specified conditions: Secondary | ICD-10-CM

## 2014-08-06 ENCOUNTER — Encounter: Payer: Self-pay | Admitting: Nurse Practitioner

## 2014-08-06 ENCOUNTER — Encounter: Payer: Self-pay | Admitting: Internal Medicine

## 2014-08-06 ENCOUNTER — Ambulatory Visit (INDEPENDENT_AMBULATORY_CARE_PROVIDER_SITE_OTHER): Payer: Medicare Other | Admitting: Nurse Practitioner

## 2014-08-06 VITALS — BP 128/62 | HR 74 | Temp 98.0°F | Resp 16 | Ht 63.5 in | Wt 166.4 lb

## 2014-08-06 DIAGNOSIS — R1032 Left lower quadrant pain: Secondary | ICD-10-CM

## 2014-08-06 DIAGNOSIS — K219 Gastro-esophageal reflux disease without esophagitis: Secondary | ICD-10-CM

## 2014-08-06 LAB — CBC WITH DIFFERENTIAL/PLATELET
Basophils Absolute: 0 10*3/uL (ref 0.0–0.1)
Basophils Relative: 0.5 % (ref 0.0–3.0)
EOS ABS: 0.1 10*3/uL (ref 0.0–0.7)
Eosinophils Relative: 0.6 % (ref 0.0–5.0)
HEMATOCRIT: 43.7 % (ref 36.0–46.0)
Hemoglobin: 14.9 g/dL (ref 12.0–15.0)
LYMPHS ABS: 1.5 10*3/uL (ref 0.7–4.0)
Lymphocytes Relative: 17 % (ref 12.0–46.0)
MCHC: 34 g/dL (ref 30.0–36.0)
MCV: 90.8 fl (ref 78.0–100.0)
MONO ABS: 0.8 10*3/uL (ref 0.1–1.0)
Monocytes Relative: 8.7 % (ref 3.0–12.0)
NEUTROS ABS: 6.7 10*3/uL (ref 1.4–7.7)
NEUTROS PCT: 73.2 % (ref 43.0–77.0)
Platelets: 187 10*3/uL (ref 150.0–400.0)
RBC: 4.81 Mil/uL (ref 3.87–5.11)
RDW: 14.8 % (ref 11.5–15.5)
WBC: 9.1 10*3/uL (ref 4.0–10.5)

## 2014-08-06 LAB — BASIC METABOLIC PANEL
BUN: 16 mg/dL (ref 6–23)
CHLORIDE: 100 meq/L (ref 96–112)
CO2: 30 meq/L (ref 19–32)
Calcium: 8.9 mg/dL (ref 8.4–10.5)
Creatinine, Ser: 0.73 mg/dL (ref 0.40–1.20)
GFR: 81.79 mL/min (ref >60.00)
Glucose, Bld: 104 mg/dL — ABNORMAL HIGH (ref 70–99)
Potassium: 3.4 mEq/L — ABNORMAL LOW (ref 3.5–5.1)
SODIUM: 137 meq/L (ref 135–145)

## 2014-08-06 MED ORDER — METRONIDAZOLE 500 MG PO TABS: 500.0000 mg | ORAL_TABLET | Freq: Three times a day (TID) | ORAL | Status: DC

## 2014-08-06 MED ORDER — CIPROFLOXACIN HCL 750 MG PO TABS: 750.0000 mg | ORAL_TABLET | Freq: Two times a day (BID) | ORAL | Status: DC

## 2014-08-06 NOTE — Progress Notes (Signed)
Subjective:    Patient ID: Linda [name], female    DOB: 1933-09-06, 81 y.o.   MRN: [id number]  HPI  Linda [name] is a 81 yo female with a CC of abdominal pain and wanting to discuss Prilosec.   1) Bilateral lower abdominal pain, had diverticulitis 30 years ago,  2 weeks a lot of gas (both sides), bloated worse last night, did not check temperature. Tuesday had a "good clean out". This morning a small BM, worst 7/10 pain.   Reports shows 3/42013  Dr. Vira Agar Sigmoid colon diverticulosis  On coumadin- Dr. Donivan Scull office is regulating this.   2) Pt unsure about Prilosec due to news story stating there is a possible association with dementia and use of PPI's.    Review of Systems  Constitutional: Positive for chills. Negative for fever, diaphoresis and fatigue.  Respiratory: Negative for chest tightness, shortness of breath and wheezing.   Cardiovascular: Negative for chest pain, palpitations and leg swelling.  Gastrointestinal: Positive for nausea, abdominal pain and abdominal distention. Negative for vomiting, diarrhea and rectal pain.       Last night  Skin: Negative for rash.  Neurological: Negative for dizziness, weakness, numbness and headaches.  Psychiatric/Behavioral: The patient is not nervous/anxious.    Past Medical History  Diagnosis Date  . Arrhythmia     paroxysmal atrial fibrillation  . PVC (premature ventricular contraction)   . Diverticulitis   . Hyperlipidemia   . Kidney stone   . Colon polyp   . Parathyroid disease     Parathyroidectomy   . UTI (lower urinary tract infection)     History   Social History  . Marital Status: Married    Spouse Name: Linda [name]  . Number of Children: 2  . Years of Education: 12   Occupational History  . Accounting     Retired   Social History Main Topics  . Smoking status: Never Smoker   . Smokeless tobacco: Never Used  . Alcohol Use: No  . Drug Use: No  . Sexual Activity: Not on file   Other Topics Concern  . 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 Recently widowed. She was married to [place], her husband for 59 years. They have a daughter and a son. She enjoys gardening.    Past Surgical History  Procedure Laterality Date  . Tonsillectomy  1956  . Vein ligation and stripping    . Cholecystectomy  2002  . Appendectomy  1984  . Total abdominal hysterectomy w/ bilateral salpingoophorectomy  1984  . Rotator cuff repair  2002  . Parathyroidectomy  2004    1 removed    Family History  Problem Relation Age of Onset  . Heart disease Brother     Allergies  Allergen Reactions  . Penicillins   . Sulfonamide Derivatives     Current Outpatient Prescriptions on File Prior to Visit  Medication Sig Dispense Refill  . cholecalciferol (VITAMIN D) 400 UNITS TABS Take 2,000 Units by mouth daily.    Marland Kitchen co-enzyme Q-10 30 MG capsule Take 30 mg by mouth 2 (two) times daily.     Marland Kitchen COUMADIN 5 MG tablet TAKE 1 TABLET (5 MG TOTAL) DAILY AS DIRECTED BY ANTI  COAG CLINIC 90 tablet 1  . diltiazem (CARDIZEM) 30 MG tablet Take 1 tablet (30 mg total) by mouth 3 (three) times daily. 270 tablet 3  . LORazepam (ATIVAN) 0.5  MG tablet Take 1/2 tablet daily as needed.    . nitroGLYCERIN (NITROSTAT) 0.4 MG SL tablet Place 1 tablet (0.4 mg total) under the tongue every 5 (five) minutes as needed. 25 tablet 1  . omeprazole (PRILOSEC) 40 MG capsule Take 1 capsule (40 mg total) by mouth daily. 30 capsule 3  . Red Yeast Rice Extract (RED YEAST RICE PO) Take by mouth 2 (two) times daily.     No current facility-administered medications on file prior to visit.      Objective:   Physical Exam  Constitutional: She is oriented to person, place, and time. She appears well-developed and well-nourished. No distress.  BP 128/62 mmHg  Pulse 74  Temp(Src) 98 F (36.7 C) (Oral)  Resp 16  Ht 5' 3.5" (1.613 m)  Wt 166 lb 6.4 oz (75.479 kg)  BMI 29.01 kg/m2  SpO2 97%   HENT:    Head: Normocephalic and atraumatic.  Right Ear: External ear normal.  Left Ear: External ear normal.  Eyes: Right eye exhibits no discharge. Left eye exhibits no discharge. No scleral icterus.  Cardiovascular: Normal rate, regular rhythm, normal heart sounds and intact distal pulses.  Exam reveals no gallop and no friction rub.   No murmur heard. Pulmonary/Chest: Effort normal and breath sounds normal. No respiratory distress. She has no wheezes. She has no rales. She exhibits no tenderness.  Abdominal: Soft. Bowel sounds are normal. She exhibits no distension and no mass. There is tenderness. There is no rebound and no guarding.  LLQ tenderness   Neurological: She is alert and oriented to person, place, and time. No cranial nerve deficit. She exhibits normal muscle tone. Coordination normal.  Skin: Skin is warm and dry. No rash noted. She is not diaphoretic.  Psychiatric: She has a normal mood and affect. Her behavior is normal. Judgment and thought content normal.      Assessment & Plan:

## 2014-08-06 NOTE — Patient Instructions (Signed)
Please visit the lab before leaving today.  Check with the Coumadin clinic for when they want to check your INR since you will be on antibiotics.   Gas-X is over the counter (simethicone)   Follow up in 2 weeks. Call if worsening/failure to improve.   Call 911 if you are unable to keep food or drink down.   It is recommended to stay on clear fluids for 2 days.

## 2014-08-06 NOTE — Progress Notes (Signed)
Pre visit review using our clinic review tool, if applicable. No additional management support is needed unless otherwise documented below in the visit note. 

## 2014-08-06 NOTE — Telephone Encounter (Signed)
Called and schedule pt with Morey Hummingbird for today.

## 2014-08-09 NOTE — Assessment & Plan Note (Signed)
Worsening. Suspect diverticulitis again. Start Cipro and Flagyl, asked her to follow up for repeat PT/INR at Dr. Donivan Scull office, she was agreeable. Will call if worsening/failure to improve.

## 2014-08-09 NOTE — Assessment & Plan Note (Signed)
Pt would like to cut down or stop Prilosec due to latest research shown on news. I asked her if she felt she could see if she could go a day or two without it and see if symptoms come back. She could always switch to something like Zantac as needed for reflux symptoms. She will think about these option she reports.

## 2014-08-10 ENCOUNTER — Ambulatory Visit (INDEPENDENT_AMBULATORY_CARE_PROVIDER_SITE_OTHER): Payer: Medicare Other | Admitting: Cardiovascular Disease

## 2014-08-10 ENCOUNTER — Encounter (INDEPENDENT_AMBULATORY_CARE_PROVIDER_SITE_OTHER): Payer: Medicare Other

## 2014-08-10 ENCOUNTER — Other Ambulatory Visit: Payer: Self-pay | Admitting: *Deleted

## 2014-08-10 DIAGNOSIS — I4891 Unspecified atrial fibrillation: Secondary | ICD-10-CM

## 2014-08-10 DIAGNOSIS — Z5181 Encounter for therapeutic drug level monitoring: Secondary | ICD-10-CM

## 2014-08-10 DIAGNOSIS — Z7901 Long term (current) use of anticoagulants: Secondary | ICD-10-CM

## 2014-08-10 LAB — POCT INR: INR: 2.1

## 2014-08-10 MED ORDER — COUMADIN 5 MG PO TABS: ORAL_TABLET | ORAL | Status: DC

## 2014-08-12 ENCOUNTER — Ambulatory Visit (INDEPENDENT_AMBULATORY_CARE_PROVIDER_SITE_OTHER): Payer: Medicare Other | Admitting: Internal Medicine

## 2014-08-12 ENCOUNTER — Encounter: Payer: Self-pay | Admitting: Internal Medicine

## 2014-08-12 VITALS — BP 138/64 | HR 43 | Temp 98.2°F | Resp 14 | Ht 64.0 in | Wt 167.2 lb

## 2014-08-12 DIAGNOSIS — K219 Gastro-esophageal reflux disease without esophagitis: Secondary | ICD-10-CM

## 2014-08-12 DIAGNOSIS — F4321 Adjustment disorder with depressed mood: Secondary | ICD-10-CM

## 2014-08-12 DIAGNOSIS — B370 Candidal stomatitis: Secondary | ICD-10-CM

## 2014-08-12 DIAGNOSIS — K5732 Diverticulitis of large intestine without perforation or abscess without bleeding: Secondary | ICD-10-CM

## 2014-08-12 MED ORDER — NYSTATIN 100000 UNIT/ML MT SUSP: 500000.0000 [IU] | Freq: Four times a day (QID) | OROMUCOSAL | Status: DC

## 2014-08-12 NOTE — Progress Notes (Signed)
Pre-visit discussion using our clinic review tool. No additional management support is needed unless otherwise documented below in the visit note.  

## 2014-08-12 NOTE — Progress Notes (Signed)
Patient ID: Linda [name], female   DOB: 03/05/1934, 81 y.o.   MRN: [id number]  Patient Active Problem List   Diagnosis Date Noted  . GERD (gastroesophageal reflux disease) 03/04/2014  . Medicare annual wellness visit, subsequent 01/13/2014  . Essential hypertension 11/04/2013  . Grief 10/29/2013  . Encounter for therapeutic drug monitoring 07/16/2013  . Screening for breast cancer 07/01/2013  . Diverticulitis of colon without hemorrhage 07/01/2013  . Left shoulder pain 03/21/2013  . Hoarseness 03/21/2013  . Long term (current) use of anticoagulants 09/28/2010  . Edema 09/05/2010  . VENTRICULAR HYPERTROPHY, LEFT 08/16/2010  . Hyperlipidemia 06/03/2010  . ATRIAL FIBRILLATION 08/13/2009  . PREMATURE VENTRICULAR CONTRACTIONS 08/13/2009    Subjective:  CC:   Chief Complaint  Patient presents with  . Follow-up    diverticulitis in 08/06/14 , patient has coating on tongue.    HPI:   Linda [name] is a 81 y.o. female who presents for  Follow up on multiple issues, including recent treatment for uncomplicated diverticulitis.  She modified her diet as directed and finished taking cipro and flagyl..  She is moving her bowels more easily and denies persistent abdominal pain  After transitioning to solid food. She has developed a thick coating on her tongue that does not brush off. She has not been taking a probiotic.     Past Medical History  Diagnosis Date  . Arrhythmia     paroxysmal atrial fibrillation  . PVC (premature ventricular contraction)   . Diverticulitis   . Hyperlipidemia   . Kidney stone   . Colon polyp   . Parathyroid disease     Parathyroidectomy   . UTI (lower urinary tract infection)     Past Surgical History  Procedure Laterality Date  . Tonsillectomy  1956  . Vein ligation and stripping    . Cholecystectomy  2002  . Appendectomy  1984  . Total abdominal hysterectomy w/ bilateral salpingoophorectomy  1984  . Rotator cuff repair  2002  .  Parathyroidectomy  2004    1 removed       The following portions of the patient's history were reviewed and updated as appropriate: Allergies, current medications, and problem list.    Review of Systems:   Patient denies headache, fevers, malaise, unintentional weight loss, skin rash, eye pain, sinus congestion and sinus pain, sore throat, dysphagia,  hemoptysis , cough, dyspnea, wheezing, chest pain, palpitations, orthopnea, edema, abdominal pain, nausea, melena, diarrhea, constipation, flank pain, dysuria, hematuria, urinary  Frequency, nocturia, numbness, tingling, seizures,  Focal weakness, Loss of consciousness,  Tremor, insomnia, depression, anxiety, and suicidal ideation.     History   Social History  . Marital Status: Married    Spouse Name: Linda [name]  . Number of Children: 2  . Years of Education: 12   Occupational History  . Accounting     Retired   Social History Main Topics  . Smoking status: Never Smoker   . Smokeless tobacco: Never Used  . Alcohol Use: No  . Drug Use: No  . Sexual Activity: Not on file   Other Topics Concern  . Not on file   Social History Narrative   Linda [name] is from [facility] and grew up [place] a farm. Recently widowed. She was married to Linda [name], her husband for 59 years. They have a daughter and a son. She enjoys gardening.    Objective:  Filed Vitals:   08/12/14 1000  BP: 138/64  Pulse: 43  Temp: 98.2 F (36.8 C)  Resp: 14     General appearance: alert, cooperative and appears stated age Ears: normal TM's and external ear canals both ears Throat: lips, mucosa, nrormal.  Tongue coated with thick white coating  Neck: no adenopathy, no carotid bruit, supple, symmetrical, trachea midline and thyroid not enlarged, symmetric, no tenderness/mass/nodules Back: symmetric, no curvature. ROM normal. No CVA tenderness. Lungs: clear to auscultation bilaterally Heart: regular rate and rhythm, S1, S2 normal, no murmur, click, rub or  gallop Abdomen: soft, non-tender; bowel sounds normal; no masses,  no organomegaly Pulses: 2+ and symmetric Skin: Skin color, texture, turgor normal. No rashes or lesions Lymph nodes: Cervical, supraclavicular, and axillary nodes normal.  Assessment and Plan:  Problem List Items Addressed This Visit    Oral thrush    Nystatin oral suspension prescribed       Relevant Medications   nystatin (MYCOSTATIN) 100000 UNIT/ML suspension   Grief    She continues to have increased anxiety and insomnia that is managed with 1/2 tablet lorazepam but occasionally feels need to use a second dose and is wondering it this would be safe to do,  Advise to use paringly,  And consider SSRI therapy if symptoms persist.The risks and benefits of benzodiazepine use were reviewed with patient today including excessive sedation leading to respiratory depression,  impaired thinking/driving, and addiction.  Patient was advised to avoid concurrent use with alcohol, to use medication only as needed and not to share with others  .       GERD (gastroesophageal reflux disease) - Primary    Currently managed with PPI, but she is concerned about the recently published studies suggesting an association with increased risk of dementia .  Advised to switch to H2 blocker and if symptoms are controlled,  Continued daily h2 blocker.       Diverticulitis of colon without hemorrhage    Suggested by recent developemnt of LLQ abd pain,  Resolved with change to clear liquid diet and abx.  Advised to take a probiotic for a minimum of 2 weeks,  Low residue diet discussed.         A total of 25 minutes of face to face time was spent with patient more than half of which was spent in counselling and coordination of care

## 2014-08-12 NOTE — Patient Instructions (Addendum)
Please take a probiotic ( Align, Floraque or Culturelle) or the generic equivalent FOR AT LEAST TWO WEEKS to prevent a serious antibiotic associated diarrhea  Called clostriiium dificile colitis and a vaginal yeast infection .  I recommend getting at least half of your calcium and Vitamin D  Requirements through dietary sources rather than supplements given the recent association of calcium supplements with increased coronary artery calcium scores (You need 1800 mg daily )   Unsweetened almond/coconut milk, Cashew and soy  Milks are all great low calorie low carb, cholesterol free sources of dietary calcium and vitamin D.  This will also give you  the health benefits of daily nuts without aggravating your diverticulosis   Try substituting Pepcid or Zantac daily instead of omeprazole to control your reflux.  It your reflux symptoms return ,  We will pick an alternative PPI  It is  Safe to take 1/2 lorazepam up to twice daily AS NEEDED.    Paxil or lexapro may help  REDUCE YOUR USE ONCE DAILY AT bedtime ,  But this can be decided down the road.  Your cholesterol was fine in July 2015;  I hwould continue the red Yeast Rice and repeat your labs in July 2016

## 2014-08-13 ENCOUNTER — Encounter: Payer: Self-pay | Admitting: Internal Medicine

## 2014-08-13 DIAGNOSIS — B370 Candidal stomatitis: Secondary | ICD-10-CM | POA: Insufficient documentation

## 2014-08-13 NOTE — Assessment & Plan Note (Signed)
Suggested by recent developemnt of LLQ abd pain,  Resolved with change to clear liquid diet and abx.  Advised to take a probiotic for a minimum of 2 weeks,  Low residue diet discussed.

## 2014-08-13 NOTE — Assessment & Plan Note (Signed)
Nystatin oral suspension prescribed

## 2014-08-13 NOTE — Assessment & Plan Note (Signed)
Currently managed with PPI, but she is concerned about the recently published studies suggesting an association with increased risk of dementia .  Advised to switch to H2 blocker and if symptoms are controlled,  Continued daily h2 blocker.

## 2014-08-13 NOTE — Assessment & Plan Note (Signed)
She continues to have increased anxiety and insomnia that is managed with 1/2 tablet lorazepam but occasionally feels need to use a second dose and is wondering it this would be safe to do,  Advise to use paringly,  And consider SSRI therapy if symptoms persist.The risks and benefits of benzodiazepine use were reviewed with patient today including excessive sedation leading to respiratory depression,  impaired thinking/driving, and addiction.  Patient was advised to avoid concurrent use with alcohol, to use medication only as needed and not to share with others  .

## 2014-08-18 ENCOUNTER — Encounter: Payer: Self-pay | Admitting: *Deleted

## 2014-08-18 ENCOUNTER — Ambulatory Visit: Payer: Self-pay | Admitting: Internal Medicine

## 2014-08-18 LAB — HM DEXA SCAN

## 2014-08-18 LAB — HM MAMMOGRAPHY: HM Mammogram: NEGATIVE

## 2014-08-19 ENCOUNTER — Ambulatory Visit (INDEPENDENT_AMBULATORY_CARE_PROVIDER_SITE_OTHER): Payer: Medicare Other | Admitting: *Deleted

## 2014-08-19 DIAGNOSIS — I4891 Unspecified atrial fibrillation: Secondary | ICD-10-CM

## 2014-08-19 DIAGNOSIS — Z7901 Long term (current) use of anticoagulants: Secondary | ICD-10-CM | POA: Diagnosis not present

## 2014-08-19 DIAGNOSIS — Z5181 Encounter for therapeutic drug level monitoring: Secondary | ICD-10-CM

## 2014-08-19 LAB — POCT INR: INR: 3.6

## 2014-08-21 ENCOUNTER — Telehealth: Payer: Self-pay | Admitting: Internal Medicine

## 2014-08-21 DIAGNOSIS — M8580 Other specified disorders of bone density and structure, unspecified site: Secondary | ICD-10-CM | POA: Insufficient documentation

## 2014-08-21 NOTE — Telephone Encounter (Signed)
Sent my chart with results. 

## 2014-08-21 NOTE — Telephone Encounter (Signed)
Bone Density scores received, she has osteopenia,  Moderate.  Would repeat in 2 years and consider therapy then if there is a significant change. Continue calcium, vitamin d and weight bearing exercise on a regular basis.  

## 2014-08-24 ENCOUNTER — Encounter: Payer: Self-pay | Admitting: Cardiovascular Disease

## 2014-08-24 ENCOUNTER — Other Ambulatory Visit: Payer: Self-pay

## 2014-08-24 MED ORDER — COUMADIN 5 MG PO TABS: ORAL_TABLET | ORAL | Status: DC

## 2014-09-02 ENCOUNTER — Other Ambulatory Visit: Payer: Medicare Other

## 2014-09-02 ENCOUNTER — Ambulatory Visit (INDEPENDENT_AMBULATORY_CARE_PROVIDER_SITE_OTHER): Payer: Medicare Other | Admitting: *Deleted

## 2014-09-02 DIAGNOSIS — I4891 Unspecified atrial fibrillation: Secondary | ICD-10-CM

## 2014-09-02 DIAGNOSIS — Z7901 Long term (current) use of anticoagulants: Secondary | ICD-10-CM

## 2014-09-02 DIAGNOSIS — Z5181 Encounter for therapeutic drug level monitoring: Secondary | ICD-10-CM

## 2014-09-02 LAB — POCT INR: INR: 2.9

## 2014-09-23 ENCOUNTER — Ambulatory Visit (INDEPENDENT_AMBULATORY_CARE_PROVIDER_SITE_OTHER): Payer: Medicare Other

## 2014-09-23 DIAGNOSIS — Z5181 Encounter for therapeutic drug level monitoring: Secondary | ICD-10-CM | POA: Diagnosis not present

## 2014-09-23 DIAGNOSIS — I4891 Unspecified atrial fibrillation: Secondary | ICD-10-CM | POA: Diagnosis not present

## 2014-09-23 DIAGNOSIS — Z7901 Long term (current) use of anticoagulants: Secondary | ICD-10-CM | POA: Diagnosis not present

## 2014-09-23 LAB — POCT INR: INR: 2.4

## 2014-09-25 ENCOUNTER — Encounter: Payer: Self-pay | Admitting: Internal Medicine

## 2014-10-13 DIAGNOSIS — C4492 Squamous cell carcinoma of skin, unspecified: Secondary | ICD-10-CM

## 2014-10-13 HISTORY — DX: Squamous cell carcinoma of skin, unspecified: C44.92

## 2014-10-21 ENCOUNTER — Ambulatory Visit (INDEPENDENT_AMBULATORY_CARE_PROVIDER_SITE_OTHER): Payer: Medicare Other

## 2014-10-21 DIAGNOSIS — Z7901 Long term (current) use of anticoagulants: Secondary | ICD-10-CM

## 2014-10-21 DIAGNOSIS — I4891 Unspecified atrial fibrillation: Secondary | ICD-10-CM | POA: Diagnosis not present

## 2014-10-21 DIAGNOSIS — Z5181 Encounter for therapeutic drug level monitoring: Secondary | ICD-10-CM

## 2014-10-21 LAB — POCT INR: INR: 2.4

## 2014-10-29 ENCOUNTER — Encounter: Payer: Self-pay | Admitting: *Deleted

## 2014-10-29 LAB — HM DIABETES EYE EXAM

## 2014-11-03 ENCOUNTER — Encounter: Payer: Self-pay | Admitting: Cardiovascular Disease

## 2014-11-03 ENCOUNTER — Ambulatory Visit (INDEPENDENT_AMBULATORY_CARE_PROVIDER_SITE_OTHER): Payer: Medicare Other | Admitting: Cardiovascular Disease

## 2014-11-03 VITALS — BP 132/80 | HR 68 | Ht 63.0 in | Wt 166.5 lb

## 2014-11-03 DIAGNOSIS — I10 Essential (primary) hypertension: Secondary | ICD-10-CM

## 2014-11-03 DIAGNOSIS — I4891 Unspecified atrial fibrillation: Secondary | ICD-10-CM | POA: Diagnosis not present

## 2014-11-03 DIAGNOSIS — Z7189 Other specified counseling: Secondary | ICD-10-CM

## 2014-11-03 DIAGNOSIS — E785 Hyperlipidemia, unspecified: Secondary | ICD-10-CM

## 2014-11-03 DIAGNOSIS — R609 Edema, unspecified: Secondary | ICD-10-CM | POA: Diagnosis not present

## 2014-11-03 NOTE — Assessment & Plan Note (Signed)
Blood pressure is well controlled on today's visit. No changes made to the medications. 

## 2014-11-03 NOTE — Assessment & Plan Note (Signed)
She takes red yeast rice Lab order placed to check liver and lipid, fasting

## 2014-11-03 NOTE — Assessment & Plan Note (Signed)
Minimal leg edema likely from venous insufficiency.

## 2014-11-03 NOTE — Assessment & Plan Note (Signed)
Encouraged her to stay on her diltiazem 120 mg daily. Tolerating warfarin

## 2014-11-03 NOTE — Patient Instructions (Addendum)
You are doing well. No medication changes were made.  Please call us if you have new issues that need to be addressed before your next appt.  Your physician wants you to follow-up in: 6 months.  You will receive a reminder letter in the mail two months in advance. If you don't receive a letter, please call our office to schedule the follow-up appointment.   

## 2014-11-03 NOTE — Progress Notes (Signed)
Patient ID: Linda [name], female    DOB: [birth year], 81 y.o.   MRN: [id number]  HPI Comments: Linda [name] is a 81 year old woman with past medical history of chronic atrial fibrillation on warfarin, normal coronary arteries in September 2007,  previous nocturnal oximetry had showed some desaturations though she reports sleeping well and has no significant snoring or apnea, who presents for followup of her atrial fibrillation and blood pressure management  In follow-up today, she has been taking diltiazem 120 mg daily Blood pressure typically runs 024 up to 097 systolic, heart rate 60 to 70s. Occasionally heart rate in the 40s but this is likely secondary to blood pressure cuff anomaly In general she feels well, is starting to travel. Planning a trip to [place] to have problems with anxiety, insomnia, continues to feel tired  EKG on today's visit shows atrial fibrillation with ventricular rate 68 bpm, nonspecific T wave abnormality  Other past medical history lost her husband in early 2015. She continues to have problems with adjusting Previous nosebleed requiring packing  Lab work shows total cholesterol 174, LDL 89, HDL 68, hemoglobin A1c 6.3, creatinine 0.8 Recent lab work showing hemoglobin A1c 6.3, total cholesterol 180, LDL 97, creatinine 0.8, normal LFTs   Allergies  Allergen Reactions  . Penicillins   . Sulfonamide Derivatives     Current Outpatient Prescriptions on File Prior to Visit  Medication Sig Dispense Refill  . cholecalciferol (VITAMIN D) 400 UNITS TABS Take 2,000 Units by mouth daily.    Marland Kitchen co-enzyme Q-10 30 MG capsule Take 30 mg by mouth 2 (two) times daily.     Marland Kitchen COUMADIN 5 MG tablet TAKE 1 TABLET (5 MG TOTAL) DAILY AS DIRECTED BY ANTI  [facility] 90 tablet 1  . diltiazem (CARDIZEM) 30 MG tablet Take 1 tablet (30 mg total) by mouth 3 (three) times daily. 270 tablet 3  . LORazepam (ATIVAN) 0.5 MG tablet Take 1/2 tablet daily as needed.    .  nitroGLYCERIN (NITROSTAT) 0.4 MG SL tablet Place 1 tablet (0.4 mg total) under the tongue every 5 (five) minutes as needed. 25 tablet 1  . Red Yeast Rice Extract (RED YEAST RICE PO) Take by mouth 2 (two) times daily.     No current facility-administered medications on file prior to visit.    Past Medical History  Diagnosis Date  . Arrhythmia     paroxysmal atrial fibrillation  . PVC (premature ventricular contraction)   . Diverticulitis   . Hyperlipidemia   . Kidney stone   . Colon polyp   . Parathyroid disease     Parathyroidectomy   . UTI (lower urinary tract infection)     Past Surgical History  Procedure Laterality Date  . Tonsillectomy  1956  . Vein ligation and stripping    . Cholecystectomy  2002  . Appendectomy  1984  . Total abdominal hysterectomy w/ bilateral salpingoophorectomy  1984  . Rotator cuff repair  2002  . Parathyroidectomy  2004    1 removed    Social History  reports that she has never smoked. She has never used smokeless tobacco. She reports that she does not drink alcohol or use illicit drugs.  Family History family history includes Heart disease in her brother.   Review of Systems  Constitutional: Negative.   HENT: Negative.   Respiratory: Negative.   Cardiovascular: Negative.   Gastrointestinal: Negative.   Musculoskeletal: Negative.   Neurological: Negative.   Hematological: Negative.  Psychiatric/Behavioral: Positive for dysphoric mood.  All other systems reviewed and are negative.  BP 132/80 mmHg  Pulse 68  Ht 5\' 3"  (1.6 m)  Wt 166 lb 8 oz (75.524 kg)  BMI 29.50 kg/m2   Physical Exam  Constitutional: She is oriented to person, place, and time. She appears well-developed and well-nourished.  HENT:  Head: Normocephalic.  Nose: Nose normal.  Mouth/Throat: Oropharynx is clear and moist.  Eyes: Conjunctivae are normal. Pupils are equal, round, and reactive to light.  Neck: Normal range of motion. Neck supple. No JVD present.   Cardiovascular: Normal rate, S1 normal, S2 normal and intact distal pulses.  An irregularly irregular rhythm present. Exam reveals no gallop and no friction rub.   No murmur heard. Pulmonary/Chest: Effort normal and breath sounds normal. No respiratory distress. She has no wheezes. She has no rales. She exhibits no tenderness.  Abdominal: Soft. Bowel sounds are normal. She exhibits no distension. There is no tenderness.  Musculoskeletal: Normal range of motion. She exhibits no edema or tenderness.  Lymphadenopathy:    She has no cervical adenopathy.  Neurological: She is alert and oriented to person, place, and time. Coordination normal.  Skin: Skin is warm and dry. No rash noted. No erythema.  Psychiatric: She has a normal mood and affect. Her behavior is normal. Judgment and thought content normal.    Assessment and Plan  Nursing note and vitals reviewed.

## 2014-11-03 NOTE — Assessment & Plan Note (Signed)
She is doing well on warfarin. She does not want to change to a NOAC.

## 2014-11-04 ENCOUNTER — Encounter: Payer: Self-pay | Admitting: Internal Medicine

## 2014-11-04 ENCOUNTER — Other Ambulatory Visit: Payer: Self-pay | Admitting: Internal Medicine

## 2014-11-04 MED ORDER — LORAZEPAM 0.5 MG PO TABS: 0.5000 mg | ORAL_TABLET | Freq: Every day | ORAL | Status: DC

## 2014-11-06 ENCOUNTER — Other Ambulatory Visit (INDEPENDENT_AMBULATORY_CARE_PROVIDER_SITE_OTHER): Payer: Medicare Other | Admitting: *Deleted

## 2014-11-06 DIAGNOSIS — I4891 Unspecified atrial fibrillation: Secondary | ICD-10-CM | POA: Diagnosis not present

## 2014-11-06 DIAGNOSIS — E785 Hyperlipidemia, unspecified: Secondary | ICD-10-CM

## 2014-11-07 LAB — LIPID PANEL
Chol/HDL Ratio: 3 ratio units (ref 0.0–4.4)
Cholesterol, Total: 254 mg/dL — ABNORMAL HIGH (ref 100–199)
HDL: 85 mg/dL (ref >39)
LDL CALC: 154 mg/dL — AB (ref 0–99)
Triglycerides: 76 mg/dL (ref 0–149)
VLDL CHOLESTEROL CAL: 15 mg/dL (ref 5–40)

## 2014-11-07 LAB — HEPATIC FUNCTION PANEL
ALBUMIN: 4.1 g/dL (ref 3.5–4.8)
ALK PHOS: 67 IU/L (ref 39–117)
ALT: 16 IU/L (ref 0–32)
AST: 22 IU/L (ref 0–40)
BILIRUBIN, DIRECT: 0.21 mg/dL (ref 0.00–0.40)
Bilirubin Total: 0.9 mg/dL (ref 0.0–1.2)
TOTAL PROTEIN: 6.7 g/dL (ref 6.0–8.5)

## 2014-11-17 ENCOUNTER — Other Ambulatory Visit: Payer: Self-pay

## 2014-11-17 MED ORDER — ROSUVASTATIN CALCIUM 5 MG PO TABS: 5.0000 mg | ORAL_TABLET | Freq: Every day | ORAL | Status: DC

## 2014-11-25 ENCOUNTER — Ambulatory Visit (INDEPENDENT_AMBULATORY_CARE_PROVIDER_SITE_OTHER): Payer: Medicare Other

## 2014-11-25 DIAGNOSIS — Z5181 Encounter for therapeutic drug level monitoring: Secondary | ICD-10-CM | POA: Diagnosis not present

## 2014-11-25 DIAGNOSIS — Z7901 Long term (current) use of anticoagulants: Secondary | ICD-10-CM | POA: Diagnosis not present

## 2014-11-25 DIAGNOSIS — I4891 Unspecified atrial fibrillation: Secondary | ICD-10-CM

## 2014-11-25 LAB — POCT INR: INR: 2.7

## 2014-12-28 ENCOUNTER — Encounter: Payer: Self-pay | Admitting: Nurse Practitioner

## 2014-12-28 ENCOUNTER — Ambulatory Visit (INDEPENDENT_AMBULATORY_CARE_PROVIDER_SITE_OTHER): Payer: Medicare Other | Admitting: Nurse Practitioner

## 2014-12-28 VITALS — BP 120/62 | HR 71 | Temp 98.0°F | Resp 14 | Ht 63.0 in | Wt 167.8 lb

## 2014-12-28 DIAGNOSIS — R300 Dysuria: Secondary | ICD-10-CM | POA: Diagnosis not present

## 2014-12-28 DIAGNOSIS — Z7901 Long term (current) use of anticoagulants: Secondary | ICD-10-CM

## 2014-12-28 LAB — POCT URINALYSIS DIPSTICK
Glucose, UA: NEGATIVE
NITRITE UA: NEGATIVE
Protein, UA: 100
Spec Grav, UA: >=1.03
Urobilinogen, UA: 0.2
pH, UA: 5.5

## 2014-12-28 MED ORDER — CIPROFLOXACIN HCL 500 MG PO TABS: 500.0000 mg | ORAL_TABLET | Freq: Two times a day (BID) | ORAL | Status: DC

## 2014-12-28 NOTE — Assessment & Plan Note (Addendum)
POCT urine looks to be probable infection. Will obtain culture and do 3 days of twice daily cipro. Will obtain PT/INR on Thursday since pt will be gone Friday. No vaginal concerns found on exam. Encouraged probiotics. FU prn worsening/failure to improve.

## 2014-12-28 NOTE — Progress Notes (Signed)
Pre visit review using our clinic review tool, if applicable. No additional management support is needed unless otherwise documented below in the visit note. 

## 2014-12-28 NOTE — Patient Instructions (Signed)
Cipro twice daily for 3 days.   Please take a probiotic ( Align, Floraque or Culturelle) while you are on the antibiotic to prevent a serious antibiotic associated diarrhea  Called clostirudium dificile colitis and a vaginal yeast infection.  PT/INR repeat at our office Friday (make lab appointment).

## 2014-12-28 NOTE — Progress Notes (Signed)
   Subjective:    Patient ID: Linda [name], female    DOB: [birth year], 81 y.o.   MRN: [id number]  HPI  Linda [name] is a 81 yo female with a CC of UTI symptoms x 5 days.  1) Linda [name] reports urgency, burning, and feels it is related to the stream, but she would like to see if she has a rash around the vulva. Denies itching or discharge.  Flagyl has given her diarrhea in the past. She has taken no antibiotics in the last 3 months.   Review of Systems  Constitutional: Negative for fever, chills, diaphoresis and fatigue.  Genitourinary: Positive for dysuria, urgency and frequency. Negative for hematuria, flank pain, decreased urine volume, vaginal bleeding, vaginal discharge, vaginal pain and pelvic pain.  Skin: Negative for rash.      Objective:   Physical Exam  Constitutional: She is oriented to person, place, and time. She appears well-developed and well-nourished. No distress.  BP 120/62 mmHg  Pulse 71  Temp(Src) 98 F (36.7 C)  Resp 14  Ht 5\' 3"  (1.6 m)  Wt 167 lb 12.8 oz (76.114 kg)  BMI 29.73 kg/m2  SpO2 96%   HENT:  Head: Normocephalic and atraumatic.  Right Ear: External ear normal.  Left Ear: External ear normal.  Pulmonary/Chest: Effort normal and breath sounds normal. No respiratory distress. She has no wheezes. She has no rales. She exhibits no tenderness.  Abdominal: There is no CVA tenderness.  Genitourinary: Vagina normal.    There is no rash, tenderness, lesion or injury on the right labia. There is no rash, tenderness, lesion or injury on the left labia.  Neurological: She is alert and oriented to person, place, and time. No cranial nerve deficit. She exhibits normal muscle tone. Coordination normal.  Skin: Skin is warm and dry. No rash noted. She is not diaphoretic.  Psychiatric: She has a normal mood and affect. Her behavior is normal. Judgment and thought content normal.      Assessment & Plan:

## 2014-12-29 ENCOUNTER — Telehealth: Payer: Self-pay | Admitting: *Deleted

## 2014-12-29 NOTE — Telephone Encounter (Signed)
Pt is asking she was put on antibiotic yesterday for UTI  She is asking if she needs to be seen tmrw for a coumadin check  She already is coming 01/06/15 but wasn't sure if she needs to be seen sooner because of the antibiotic.

## 2014-12-29 NOTE — Telephone Encounter (Signed)
Called spoke with pt, pt states she was started on Cipro 500mg  BID x 3 days on 12/28/14, awaiting culture results from [facility]. Made appt for INR check tomorrow 12/30/14.

## 2014-12-30 ENCOUNTER — Encounter: Payer: Self-pay | Admitting: Nurse Practitioner

## 2014-12-30 ENCOUNTER — Ambulatory Visit (INDEPENDENT_AMBULATORY_CARE_PROVIDER_SITE_OTHER): Payer: Medicare Other | Admitting: *Deleted

## 2014-12-30 DIAGNOSIS — I4891 Unspecified atrial fibrillation: Secondary | ICD-10-CM

## 2014-12-30 DIAGNOSIS — Z5181 Encounter for therapeutic drug level monitoring: Secondary | ICD-10-CM

## 2014-12-30 DIAGNOSIS — Z7901 Long term (current) use of anticoagulants: Secondary | ICD-10-CM | POA: Diagnosis not present

## 2014-12-30 LAB — POCT INR: INR: 2.9

## 2014-12-31 ENCOUNTER — Other Ambulatory Visit: Payer: Medicare Other

## 2014-12-31 LAB — URINE CULTURE: Colony Count: >=100000

## 2015-01-06 ENCOUNTER — Telehealth: Payer: Self-pay

## 2015-01-06 NOTE — Telephone Encounter (Signed)
Called to ask patient for permission to be placed on the [facility] Coach schedule for upcoming AWV.  Currently scheduled PCP.  No answer.

## 2015-01-12 ENCOUNTER — Other Ambulatory Visit (INDEPENDENT_AMBULATORY_CARE_PROVIDER_SITE_OTHER): Payer: Medicare Other

## 2015-01-12 ENCOUNTER — Telehealth: Payer: Self-pay | Admitting: *Deleted

## 2015-01-12 DIAGNOSIS — E785 Hyperlipidemia, unspecified: Secondary | ICD-10-CM

## 2015-01-12 DIAGNOSIS — Z5181 Encounter for therapeutic drug level monitoring: Secondary | ICD-10-CM | POA: Diagnosis not present

## 2015-01-12 DIAGNOSIS — E039 Hypothyroidism, unspecified: Secondary | ICD-10-CM

## 2015-01-12 DIAGNOSIS — I10 Essential (primary) hypertension: Secondary | ICD-10-CM

## 2015-01-12 DIAGNOSIS — Z7901 Long term (current) use of anticoagulants: Secondary | ICD-10-CM

## 2015-01-12 LAB — CBC WITH DIFFERENTIAL/PLATELET
BASOS ABS: 0 10*3/uL (ref 0.0–0.1)
BASOS PCT: 0.7 % (ref 0.0–3.0)
EOS ABS: 0.1 10*3/uL (ref 0.0–0.7)
Eosinophils Relative: 1.4 % (ref 0.0–5.0)
HCT: 46.9 % — ABNORMAL HIGH (ref 36.0–46.0)
HEMOGLOBIN: 16 g/dL — AB (ref 12.0–15.0)
Lymphocytes Relative: 31.5 % (ref 12.0–46.0)
Lymphs Abs: 1.4 10*3/uL (ref 0.7–4.0)
MCHC: 34.1 g/dL (ref 30.0–36.0)
MCV: 92.9 fl (ref 78.0–100.0)
MONO ABS: 0.4 10*3/uL (ref 0.1–1.0)
MONOS PCT: 10.1 % (ref 3.0–12.0)
Neutro Abs: 2.4 10*3/uL (ref 1.4–7.7)
Neutrophils Relative %: 56.3 % (ref 43.0–77.0)
Platelets: 186 10*3/uL (ref 150.0–400.0)
RBC: 5.05 Mil/uL (ref 3.87–5.11)
RDW: 14.7 % (ref 11.5–15.5)
WBC: 4.3 10*3/uL (ref 4.0–10.5)

## 2015-01-12 LAB — COMPREHENSIVE METABOLIC PANEL
ALT: 17 U/L (ref 0–35)
AST: 20 U/L (ref 0–37)
Albumin: 3.9 g/dL (ref 3.5–5.2)
Alkaline Phosphatase: 58 U/L (ref 39–117)
BUN: 17 mg/dL (ref 6–23)
CO2: 29 meq/L (ref 19–32)
Calcium: 9.3 mg/dL (ref 8.4–10.5)
Chloride: 103 mEq/L (ref 96–112)
Creatinine, Ser: 0.8 mg/dL (ref 0.40–1.20)
GFR: 73.51 mL/min (ref >60.00)
Glucose, Bld: 119 mg/dL — ABNORMAL HIGH (ref 70–99)
Potassium: 3.9 mEq/L (ref 3.5–5.1)
SODIUM: 140 meq/L (ref 135–145)
TOTAL PROTEIN: 6.4 g/dL (ref 6.0–8.3)
Total Bilirubin: 1.2 mg/dL (ref 0.2–1.2)

## 2015-01-12 LAB — LIPID PANEL
Cholesterol: 227 mg/dL — ABNORMAL HIGH (ref 0–200)
HDL: 69.8 mg/dL (ref >39.00)
LDL Cholesterol: 141 mg/dL — ABNORMAL HIGH (ref 0–99)
NONHDL: 157.2
Total CHOL/HDL Ratio: 3
Triglycerides: 79 mg/dL (ref 0.0–149.0)
VLDL: 15.8 mg/dL (ref 0.0–40.0)

## 2015-01-12 LAB — TSH: TSH: 1.42 u[IU]/mL (ref 0.35–4.50)

## 2015-01-12 NOTE — Telephone Encounter (Signed)
Perfect, Thank you!

## 2015-01-12 NOTE — Telephone Encounter (Signed)
I put in CBC, Cmet. Lipid and Tsh anything else?

## 2015-01-12 NOTE — Addendum Note (Signed)
Addended by: Nanci Pina on: 01/12/2015 08:23 AM   Modules accepted: Orders

## 2015-01-12 NOTE — Addendum Note (Signed)
Addended by: Karlene Einstein D on: 01/12/2015 08:30 AM   Modules accepted: Orders

## 2015-01-12 NOTE — Telephone Encounter (Signed)
Labs and dx?  

## 2015-01-14 ENCOUNTER — Encounter: Payer: Self-pay | Admitting: Internal Medicine

## 2015-01-19 ENCOUNTER — Ambulatory Visit (INDEPENDENT_AMBULATORY_CARE_PROVIDER_SITE_OTHER): Payer: Medicare Other | Admitting: Internal Medicine

## 2015-01-19 ENCOUNTER — Encounter: Payer: Self-pay | Admitting: Internal Medicine

## 2015-01-19 VITALS — BP 110/78 | HR 66 | Temp 97.8°F | Resp 12 | Ht 63.0 in | Wt 168.0 lb

## 2015-01-19 DIAGNOSIS — Z889 Allergy status to unspecified drugs, medicaments and biological substances status: Secondary | ICD-10-CM

## 2015-01-19 DIAGNOSIS — K219 Gastro-esophageal reflux disease without esophagitis: Secondary | ICD-10-CM

## 2015-01-19 DIAGNOSIS — Z789 Other specified health status: Secondary | ICD-10-CM

## 2015-01-19 DIAGNOSIS — E785 Hyperlipidemia, unspecified: Secondary | ICD-10-CM

## 2015-01-19 DIAGNOSIS — I10 Essential (primary) hypertension: Secondary | ICD-10-CM | POA: Diagnosis not present

## 2015-01-19 DIAGNOSIS — Z0000 Encounter for general adult medical examination without abnormal findings: Secondary | ICD-10-CM | POA: Diagnosis not present

## 2015-01-19 NOTE — Progress Notes (Signed)
Pre-visit discussion using our clinic review tool. No additional management support is needed unless otherwise documented below in the visit note.  

## 2015-01-19 NOTE — Progress Notes (Signed)
Patient ID: Linda [name], female    DOB: [birth year]  Age: 81 y.o. MRN: [id number]  The patient is here for annual Medicare wellness examination and management of other chronic and acute problems.   mammogram due Jan 2017 colonoscopy normal at age 64  The risk factors are reflected in the social history.  The roster of all physicians providing medical care to patient - is listed in the Snapshot section of the chart.  Activities of daily living:  The patient is 100% independent in all ADLs: dressing, toileting, feeding as well as independent mobility  Home safety : The patient has smoke detectors in the home. They wear seatbelts.  There are no firearms at home. There is no violence in the home.   There is no risks for hepatitis, STDs or HIV. There is no   history of blood transfusion. They have no travel history to infectious disease endemic areas of the world.  The patient has seen their dentist in the last six month. They have seen their eye doctor in the last yeay.  and her cataracts are not severe enough to warrant surgery. They admit to slight hearing difficulty with regard to whispered voices and some television programs.  They have deferred audiologic testing in the last year.  They do not  have excessive sun exposure. Discussed the need for sun protection: hats, long sleeves and use of sunscreen if there is significant sun exposure.   Diet: the importance of a healthy diet is discussed. They do have a healthy diet.  TaZAqkes 2000 IUs daily.  The benefits of regular aerobic exercise were discussed. She walks 4 times per week ,  20 minutes.   Depression screen: there are no signs or vegative symptoms of depression- irritability, change in appetite, anhedonia, sadness/tearfullness.  Cognitive assessment: the patient manages all their financial and personal affairs and is actively engaged. They could relate day,date,year and events; recalled 2/3 objects at 3 minutes; performed clock-face  test normally.  The following portions of the patient's history were reviewed and updated as appropriate: allergies, current medications, past family history, past medical history,  past surgical history, past social history  and problem list.  Visual acuity was not assessed per patient preference since she has regular follow up with her ophthalmologist. Hearing and body mass index were assessed and reviewed.   During the course of the visit the patient was educated and counseled about appropriate screening and preventive services including : fall prevention , diabetes screening, nutrition counseling, colorectal cancer screening, and recommended immunizations.    CC: The primary encounter diagnosis was Hyperlipidemia. Diagnoses of Gastroesophageal reflux disease without esophagitis, Essential hypertension, and Medicare annual wellness visit, subsequent were also pertinent to this visit.   Has days of malaise more like low energy,  despite walking daily and going to the gym 3 days per week.  She is in bed by 9 pm and out by 5:00 pm  She wakes up feeling refreshed,but gets fatigue and uses caffeinated beverages to perk her up.   Has had negative testing for sleep apnea  5 years ago,  The results were borderline. . Has lost 15 lbs since that study   Has mid morning weakness has started drinking Boost mid morning,  Typicla breakfast may be cereal and fruit, avoids eggs,   2) Hyperlipidemia: Crestor made legs ache, she stopped it 6 weeks ago and resumed red yeast rice  The myalgias have resolved with cessation of medication,  Vytorin was tolerated in  the past but she stopped it due to concern about the dose    Does not want the shingles vaccine  Takes a stool softener every monring,  No constipation with regular use  Reflux no longer an issue,  Uses omeprazole prn   History Vearl has a past medical history of Arrhythmia; PVC (premature ventricular contraction); Diverticulitis; Hyperlipidemia;  Kidney stone; Colon polyp; Parathyroid disease; and UTI (lower urinary tract infection).   She has past surgical history that includes Tonsillectomy (1956); Vein ligation and stripping; Cholecystectomy (2002); Appendectomy (1984); Total abdominal hysterectomy w/ bilateral salpingoophorectomy (1984); Rotator cuff repair (2002); and Parathyroidectomy (2004).   Her family history includes Heart disease in her brother.She reports that she has never smoked. She has never used smokeless tobacco. She reports that she does not drink alcohol or use illicit drugs.  Outpatient Prescriptions Prior to Visit  Medication Sig Dispense Refill  . cholecalciferol (VITAMIN D) 400 UNITS TABS Take 2,000 Units by mouth daily.    Marland Kitchen co-enzyme Q-10 30 MG capsule Take 30 mg by mouth 2 (two) times daily.     Marland Kitchen COUMADIN 5 MG tablet TAKE 1 TABLET (5 MG TOTAL) DAILY AS DIRECTED BY ANTI  [facility] 90 tablet 1  . diltiazem (CARDIZEM CD) 120 MG 24 hr capsule Take 1 capsule (120 mg total) by mouth daily. 90 capsule 3  . LORazepam (ATIVAN) 0.5 MG tablet Take 1 tablet (0.5 mg total) by mouth at bedtime. Take 1/2 tablet daily as needed for insomnia 30 tablet 3  . nitroGLYCERIN (NITROSTAT) 0.4 MG SL tablet Place 1 tablet (0.4 mg total) under the tongue every 5 (five) minutes as needed. 25 tablet 1  . Red Yeast Rice Extract (RED YEAST RICE PO) Take 600 mg by mouth 2 (two) times daily.     . ciprofloxacin (CIPRO) 500 MG tablet Take 1 tablet (500 mg total) by mouth 2 (two) times daily. (Patient not taking: Reported on 01/19/2015) 6 tablet 0  . diltiazem (CARDIZEM) 30 MG tablet Take 1 tablet (30 mg total) by mouth 3 (three) times daily. (Patient not taking: Reported on 12/28/2014) 270 tablet 3  . rosuvastatin (CRESTOR) 5 MG tablet Take 1 tablet (5 mg total) by mouth daily. (Patient not taking: Reported on 12/28/2014) 30 tablet 6   No facility-administered medications prior to visit.    Review of Systems   /Patient denies headache,  fevers, malaise, unintentional weight loss, skin rash, eye pain, sinus congestion and sinus pain, sore throat, dysphagia,  hemoptysis , cough, dyspnea, wheezing, chest pain, palpitations, orthopnea, edema, abdominal pain, nausea, melena, diarrhea, constipation, flank pain, dysuria, hematuria, urinary  Frequency, nocturia, numbness, tingling, seizures,  Focal weakness, Loss of consciousness,  Tremor, insomnia, depression, anxiety, and suicidal ideation.     Objective:  BP 110/78 mmHg  Pulse 66  Temp(Src) 97.8 F (36.6 C) (Oral)  Resp 12  Ht 5\' 3"  (1.6 m)  Wt 168 lb (76.204 kg)  BMI 29.77 kg/m2  SpO2 98%  Physical Exam   General appearance: alert, cooperative and appears stated age Head: Normocephalic, without obvious abnormality, atraumatic Eyes: conjunctivae/corneas clear. PERRL, EOM's intact. Fundi benign. Ears: normal TM's and external ear canals both ears Nose: Nares normal. Septum midline. Mucosa normal. No drainage or sinus tenderness. Throat: lips, mucosa, and tongue normal; teeth and gums normal Neck: no adenopathy, no carotid bruit, no JVD, supple, symmetrical, trachea midline and thyroid not enlarged, symmetric, no tenderness/mass/nodules Lungs: clear to auscultation bilaterally Breasts: normal appearance, no masses or tenderness Heart:  regular rate and rhythm, S1, S2 normal, no murmur, click, rub or gallop Abdomen: soft, non-tender; bowel sounds normal; no masses,  no organomegaly Extremities: extremities normal, atraumatic, no cyanosis or edema Pulses: 2+ and symmetric Skin: Skin color, texture, turgor normal. No rashes or lesions Neurologic: Alert and oriented X 3, normal strength and tone. Normal symmetric reflexes. Normal coordination and gait.     Assessment & Plan:   Problem List Items Addressed This Visit      Unprioritized   Hyperlipidemia - Primary    Well controlled on red yeast rice .  Liver enzymes are normal , no changes today.  Lab Results  Component  Value Date   CHOL 227* 01/12/2015   HDL 69.80 01/12/2015   LDLCALC 141* 01/12/2015   LDLDIRECT 67.7 09/07/2009   TRIG 79.0 01/12/2015   CHOLHDL 3 01/12/2015   Lab Results  Component Value Date   ALT 17 01/12/2015   AST 20 01/12/2015   ALKPHOS 58 01/12/2015   BILITOT 1.2 01/12/2015         Essential hypertension    Well controlled on current regimen. Renal function stable, no changes today.  Lab Results  Component Value Date   CREATININE 0.80 01/12/2015   Lab Results  Component Value Date   NA 140 01/12/2015   K 3.9 01/12/2015   CL 103 01/12/2015   CO2 29 01/12/2015         Medicare annual wellness visit, subsequent    Annual Medicare wellness  exam was done as well as a comprehensive physical exam and management of acute and chronic conditions .  During the course of the visit the patient was educated and counseled about appropriate screening and preventive services including : fall prevention , diabetes screening, nutrition counseling, colorectal cancer screening, and recommended immunizations.  Printed recommendations for health maintenance screenings was given.       GERD (gastroesophageal reflux disease)    Currently managed with PPI, prn  switch to H2 blocker was not effective.          I have discontinued Ms. Pollack's rosuvastatin. I am also having her maintain her co-enzyme Q-10, cholecalciferol, nitroGLYCERIN, Red Yeast Rice Extract (RED YEAST RICE PO), diltiazem, COUMADIN, diltiazem, LORazepam, and ciprofloxacin.  No orders of the defined types were placed in this encounter.    Medications Discontinued During This Encounter  Medication Reason  . rosuvastatin (CRESTOR) 5 MG tablet Patient Preference    Follow-up: Return in about 6 months (around 07/22/2015).   Crecencio Mc, MD

## 2015-01-19 NOTE — Patient Instructions (Addendum)
I recommend a trial of  Crestor 5 mg EVERY OTHER DAY .  If you tolerate it.   WE CAN REPEAT YOUR LIPIDS AFTER 2 MONTHS     Please let me know if you do not tolerate it and we will try simvastatin at 20 mg daily   You can continue 2000 IUS of Vitamin D daily   Health Maintenance Adopting a healthy lifestyle and getting preventive care can go a long way to promote health and wellness. Talk with your health care provider about what schedule of regular examinations is right for you. This is a good chance for you to check in with your provider about disease prevention and staying healthy. In between checkups, there are plenty of things you can do on your own. Experts have done a lot of research about which lifestyle changes and preventive measures are most likely to keep you healthy. Ask your health care provider for more information. WEIGHT AND DIET  Eat a healthy diet  Be sure to include plenty of vegetables, fruits, low-fat dairy products, and lean protein.  Do not eat a lot of foods high in solid fats, added sugars, or salt.  Get regular exercise. This is one of the most important things you can do for your health.  Most adults should exercise for at least 150 minutes each week. The exercise should increase your heart rate and make you sweat (moderate-intensity exercise).  Most adults should also do strengthening exercises at least twice a week. This is in addition to the moderate-intensity exercise.  Maintain a healthy weight  Body mass index (BMI) is a measurement that can be used to identify possible weight problems. It estimates body fat based on height and weight. Your health care provider can help determine your BMI and help you achieve or maintain a healthy weight.  For females 71 years of age and older:   A BMI below 18.5 is considered underweight.  A BMI of 18.5 to 24.9 is normal.  A BMI of 25 to 29.9 is considered overweight.  A BMI of 30 and above is considered obese.   Watch levels of cholesterol and blood lipids  You should start having your blood tested for lipids and cholesterol at 79 years of age, then have this test every 5 years.  You may need to have your cholesterol levels checked more often if:  Your lipid or cholesterol levels are high.  You are older than 79 years of age.  You are at high risk for heart disease.  CANCER SCREENING   Lung Cancer  Lung cancer screening is recommended for adults 69-89 years old who are at high risk for lung cancer because of a history of smoking.  A yearly low-dose CT scan of the lungs is recommended for people who:  Currently smoke.  Have quit within the past 15 years.  Have at least a 30-pack-year history of smoking. A pack year is smoking an average of one pack of cigarettes a day for 1 year.  Yearly screening should continue until it has been 15 years since you quit.  Yearly screening should stop if you develop a health problem that would prevent you from having lung cancer treatment.  Breast Cancer  Practice breast self-awareness. This means understanding how your breasts normally appear and feel.  It also means doing regular breast self-exams. Let your health care provider know about any changes, no matter how small.  If you are in your 20s or 30s, you should have  a clinical breast exam (CBE) by a health care provider every 1-3 years as part of a regular health exam.  If you are 63 or older, have a CBE every year. Also consider having a breast X-ray (mammogram) every year.  If you have a family history of breast cancer, talk to your health care provider about genetic screening.  If you are at high risk for breast cancer, talk to your health care provider about having an MRI and a mammogram every year.  Breast cancer gene (BRCA) assessment is recommended for women who have family members with BRCA-related cancers. BRCA-related cancers  include:  Breast.  Ovarian.  Tubal.  Peritoneal cancers.  Results of the assessment will determine the need for genetic counseling and BRCA1 and BRCA2 testing. Cervical Cancer Routine pelvic examinations to screen for cervical cancer are no longer recommended for nonpregnant women who are considered low risk for cancer of the pelvic organs (ovaries, uterus, and vagina) and who do not have symptoms. A pelvic examination may be necessary if you have symptoms including those associated with pelvic infections. Ask your health care provider if a screening pelvic exam is right for you.   The Pap test is the screening test for cervical cancer for women who are considered at risk.  If you had a hysterectomy for a problem that was not cancer or a condition that could lead to cancer, then you no longer need Pap tests.  If you are older than 65 years, and you have had normal Pap tests for the past 10 years, you no longer need to have Pap tests.  If you have had past treatment for cervical cancer or a condition that could lead to cancer, you need Pap tests and screening for cancer for at least 20 years after your treatment.  If you no longer get a Pap test, assess your risk factors if they change (such as having a new sexual partner). This can affect whether you should start being screened again.  Some women have medical problems that increase their chance of getting cervical cancer. If this is the case for you, your health care provider may recommend more frequent screening and Pap tests.  The human papillomavirus (HPV) test is another test that may be used for cervical cancer screening. The HPV test looks for the virus that can cause cell changes in the cervix. The cells collected during the Pap test can be tested for HPV.  The HPV test can be used to screen women 64 years of age and older. Getting tested for HPV can extend the interval between normal Pap tests from three to five years.  An HPV  test also should be used to screen women of any age who have unclear Pap test results.  After 79 years of age, women should have HPV testing as often as Pap tests.  Colorectal Cancer  This type of cancer can be detected and often prevented.  Routine colorectal cancer screening usually begins at 79 years of age and continues through 79 years of age.  Your health care provider may recommend screening at an earlier age if you have risk factors for colon cancer.  Your health care provider may also recommend using home test kits to check for hidden blood in the stool.  A small camera at the end of a tube can be used to examine your colon directly (sigmoidoscopy or colonoscopy). This is done to check for the earliest forms of colorectal cancer.  Routine screening usually begins  at age 50.  Direct examination of the colon should be repeated every 5-10 years through 79 years of age. However, you may need to be screened more often if early forms of precancerous polyps or small growths are found. Skin Cancer  Check your skin from head to toe regularly.  Tell your health care provider about any new moles or changes in moles, especially if there is a change in a mole's shape or color.  Also tell your health care provider if you have a mole that is larger than the size of a pencil eraser.  Always use sunscreen. Apply sunscreen liberally and repeatedly throughout the day.  Protect yourself by wearing long sleeves, pants, a wide-brimmed hat, and sunglasses whenever you are outside. HEART DISEASE, DIABETES, AND HIGH BLOOD PRESSURE   Have your blood pressure checked at least every 1-2 years. High blood pressure causes heart disease and increases the risk of stroke.  If you are between 55 years and 79 years old, ask your health care provider if you should take aspirin to prevent strokes.  Have regular diabetes screenings. This involves taking a blood sample to check your fasting blood sugar  level.  If you are at a normal weight and have a low risk for diabetes, have this test once every three years after 79 years of age.  If you are overweight and have a high risk for diabetes, consider being tested at a younger age or more often. PREVENTING INFECTION  Hepatitis B  If you have a higher risk for hepatitis B, you should be screened for this virus. You are considered at high risk for hepatitis B if:  You were born in a country where hepatitis B is common. Ask your health care provider which countries are considered high risk.  Your parents were born in a high-risk country, and you have not been immunized against hepatitis B (hepatitis B vaccine).  You have HIV or AIDS.  You use needles to inject street drugs.  You live with someone who has hepatitis B.  You have had sex with someone who has hepatitis B.  You get hemodialysis treatment.  You take certain medicines for conditions, including cancer, organ transplantation, and autoimmune conditions. Hepatitis C  Blood testing is recommended for:  Everyone born from 1945 through 1965.  Anyone with known risk factors for hepatitis C. Sexually transmitted infections (STIs)  You should be screened for sexually transmitted infections (STIs) including gonorrhea and chlamydia if:  You are sexually active and are younger than 79 years of age.  You are older than 79 years of age and your health care provider tells you that you are at risk for this type of infection.  Your sexual activity has changed since you were last screened and you are at an increased risk for chlamydia or gonorrhea. Ask your health care provider if you are at risk.  If you do not have HIV, but are at risk, it may be recommended that you take a prescription medicine daily to prevent HIV infection. This is called pre-exposure prophylaxis (PrEP). You are considered at risk if:  You are sexually active and do not regularly use condoms or know the HIV status  of your partner(s).  You take drugs by injection.  You are sexually active with a partner who has HIV. Talk with your health care provider about whether you are at high risk of being infected with HIV. If you choose to begin PrEP, you should first be tested for HIV.   You should then be tested every 3 months for as long as you are taking PrEP.  PREGNANCY   If you are premenopausal and you may become pregnant, ask your health care provider about preconception counseling.  If you may become pregnant, take 400 to 800 micrograms (mcg) of folic acid every day.  If you want to prevent pregnancy, talk to your health care provider about birth control (contraception). OSTEOPOROSIS AND MENOPAUSE   Osteoporosis is a disease in which the bones lose minerals and strength with aging. This can result in serious bone fractures. Your risk for osteoporosis can be identified using a bone density scan.  If you are 85 years of age or older, or if you are at risk for osteoporosis and fractures, ask your health care provider if you should be screened.  Ask your health care provider whether you should take a calcium or vitamin D supplement to lower your risk for osteoporosis.  Menopause may have certain physical symptoms and risks.  Hormone replacement therapy may reduce some of these symptoms and risks. Talk to your health care provider about whether hormone replacement therapy is right for you.  HOME CARE INSTRUCTIONS   Schedule regular health, dental, and eye exams.  Stay current with your immunizations.   Do not use any tobacco products including cigarettes, chewing tobacco, or electronic cigarettes.  If you are pregnant, do not drink alcohol.  If you are breastfeeding, limit how much and how often you drink alcohol.  Limit alcohol intake to no more than 1 drink per day for nonpregnant women. One drink equals 12 ounces of beer, 5 ounces of wine, or 1 ounces of hard liquor.  Do not use street  drugs.  Do not share needles.  Ask your health care provider for help if you need support or information about quitting drugs.  Tell your health care provider if you often feel depressed.  Tell your health care provider if you have ever been abused or do not feel safe at home. Document Released: 12/19/2010 Document Revised: 10/20/2013 Document Reviewed: 05/07/2013 [facility] Patient Information 2015 [place], [place]. This information is not intended to replace advice given to you by your health care provider. Make sure you discuss any questions you have with your health care provider.

## 2015-01-21 DIAGNOSIS — Z789 Other specified health status: Secondary | ICD-10-CM | POA: Insufficient documentation

## 2015-01-21 NOTE — Assessment & Plan Note (Signed)
Well controlled on red yeast rice .  Liver enzymes are normal , no changes today.  Lab Results  Component Value Date   CHOL 227* 01/12/2015   HDL 69.80 01/12/2015   LDLCALC 141* 01/12/2015   LDLDIRECT 67.7 09/07/2009   TRIG 79.0 01/12/2015   CHOLHDL 3 01/12/2015   Lab Results  Component Value Date   ALT 17 01/12/2015   AST 20 01/12/2015   ALKPHOS 58 01/12/2015   BILITOT 1.2 01/12/2015

## 2015-01-21 NOTE — Assessment & Plan Note (Signed)

## 2015-01-21 NOTE — Assessment & Plan Note (Signed)
Well controlled on current regimen. Renal function stable, no changes today.  Lab Results  Component Value Date   CREATININE 0.80 01/12/2015   Lab Results  Component Value Date   NA 140 01/12/2015   K 3.9 01/12/2015   CL 103 01/12/2015   CO2 29 01/12/2015

## 2015-01-21 NOTE — Assessment & Plan Note (Signed)
Currently managed with PPI, prn  switch to H2 blocker was not effective.

## 2015-02-03 ENCOUNTER — Encounter: Payer: Self-pay | Admitting: Internal Medicine

## 2015-02-03 DIAGNOSIS — R7301 Impaired fasting glucose: Secondary | ICD-10-CM

## 2015-02-03 MED ORDER — SIMVASTATIN 20 MG PO TABS: 20.0000 mg | ORAL_TABLET | Freq: Every day | ORAL | Status: DC

## 2015-02-10 ENCOUNTER — Encounter: Payer: Self-pay | Admitting: Cardiovascular Disease

## 2015-02-10 ENCOUNTER — Ambulatory Visit (INDEPENDENT_AMBULATORY_CARE_PROVIDER_SITE_OTHER): Payer: Medicare Other | Admitting: Pharmacist

## 2015-02-10 DIAGNOSIS — Z5181 Encounter for therapeutic drug level monitoring: Secondary | ICD-10-CM

## 2015-02-10 DIAGNOSIS — Z7901 Long term (current) use of anticoagulants: Secondary | ICD-10-CM

## 2015-02-10 DIAGNOSIS — I4891 Unspecified atrial fibrillation: Secondary | ICD-10-CM

## 2015-02-10 LAB — POCT INR: INR: 2

## 2015-03-02 ENCOUNTER — Other Ambulatory Visit: Payer: Self-pay | Admitting: Internal Medicine

## 2015-03-11 ENCOUNTER — Other Ambulatory Visit: Payer: Self-pay | Admitting: *Deleted

## 2015-03-11 MED ORDER — COUMADIN 5 MG PO TABS
ORAL_TABLET | ORAL | Status: DC
Start: 2015-03-11 — End: 2018-08-26

## 2015-03-11 NOTE — Telephone Encounter (Signed)
Refill done as requested and sent to [facility]

## 2015-03-11 NOTE — Telephone Encounter (Signed)
Please review for refill. Thanks!  

## 2015-03-11 NOTE — Telephone Encounter (Signed)
°  1. Which medications need to be refilled? Coumadin 5 mg  2. Which pharmacy is medication to be sent to? [facility]   3. Do they need a 30 day or 90 day supply? 90 day  4. Would they like a call back once the medication has been sent to the pharmacy? No   Pt will be running low soon

## 2015-03-17 ENCOUNTER — Ambulatory Visit (INDEPENDENT_AMBULATORY_CARE_PROVIDER_SITE_OTHER): Payer: Medicare Other

## 2015-03-17 DIAGNOSIS — Z7901 Long term (current) use of anticoagulants: Secondary | ICD-10-CM

## 2015-03-17 DIAGNOSIS — I4891 Unspecified atrial fibrillation: Secondary | ICD-10-CM | POA: Diagnosis not present

## 2015-03-17 DIAGNOSIS — Z5181 Encounter for therapeutic drug level monitoring: Secondary | ICD-10-CM | POA: Diagnosis not present

## 2015-03-17 LAB — POCT INR: INR: 2.3

## 2015-04-23 ENCOUNTER — Encounter: Payer: Self-pay | Admitting: Internal Medicine

## 2015-04-28 ENCOUNTER — Encounter: Payer: Self-pay | Admitting: Internal Medicine

## 2015-04-28 ENCOUNTER — Ambulatory Visit (INDEPENDENT_AMBULATORY_CARE_PROVIDER_SITE_OTHER): Payer: Medicare Other | Admitting: *Deleted

## 2015-04-28 DIAGNOSIS — Z5181 Encounter for therapeutic drug level monitoring: Secondary | ICD-10-CM | POA: Diagnosis not present

## 2015-04-28 DIAGNOSIS — Z7901 Long term (current) use of anticoagulants: Secondary | ICD-10-CM | POA: Diagnosis not present

## 2015-04-28 DIAGNOSIS — I4891 Unspecified atrial fibrillation: Secondary | ICD-10-CM

## 2015-04-28 LAB — POCT INR: INR: 2.8

## 2015-04-29 MED ORDER — SIMVASTATIN 10 MG PO TABS: 10.0000 mg | ORAL_TABLET | Freq: Every day | ORAL | Status: DC

## 2015-05-11 ENCOUNTER — Ambulatory Visit (INDEPENDENT_AMBULATORY_CARE_PROVIDER_SITE_OTHER): Payer: Medicare Other | Admitting: Cardiovascular Disease

## 2015-05-11 ENCOUNTER — Encounter: Payer: Self-pay | Admitting: Cardiovascular Disease

## 2015-05-11 VITALS — BP 126/88 | HR 65 | Ht 63.0 in | Wt 168.5 lb

## 2015-05-11 DIAGNOSIS — I10 Essential (primary) hypertension: Secondary | ICD-10-CM

## 2015-05-11 DIAGNOSIS — I4891 Unspecified atrial fibrillation: Secondary | ICD-10-CM | POA: Diagnosis not present

## 2015-05-11 DIAGNOSIS — E785 Hyperlipidemia, unspecified: Secondary | ICD-10-CM

## 2015-05-11 MED ORDER — DILTIAZEM HCL 30 MG PO TABS: 30.0000 mg | ORAL_TABLET | Freq: Three times a day (TID) | ORAL | Status: DC | PRN

## 2015-05-11 NOTE — Progress Notes (Signed)
Patient ID: Linda [name], female    DOB: Jun 23, 1933, 81 y.o.   MRN: [id number]  HPI Comments: Linda [name] is a 81 year old woman with past medical history of chronic atrial fibrillation on warfarin, normal coronary arteries in September 2007,  previous nocturnal oximetry had showed some desaturations though she reports sleeping well and has no significant snoring or apnea, who presents for followup of her atrial fibrillation and blood pressure management   in follow-up today, she reports that her blood pressure is labile  Sometimes seems to be elevated in the middle of the night when she does not feel well  Other times well-controlled [id number] systolic  Denies any lightheaded symptoms  Active, no regular exercise program  Overall has no complaints   EKG on today's visit shows atrial fibrillation with ventricular rate 65 bpm, nonspecific ST and T wave abnormality  Other past medical history lost her husband in early 2015. She continues to have problems with adjusting Previous nosebleed requiring packing  Lab work shows total cholesterol 174, LDL 89, HDL 68, hemoglobin A1c 6.3, creatinine 0.8 Recent lab work showing hemoglobin A1c 6.3, total cholesterol 180, LDL 97, creatinine 0.8, normal LFTs   Allergies  Allergen Reactions  . Crestor [Rosuvastatin Calcium] Other (See Comments)    Leg pain.  Marland Kitchen Penicillins   . Sulfonamide Derivatives     Current Outpatient Prescriptions on File Prior to Visit  Medication Sig Dispense Refill  . cholecalciferol (VITAMIN D) 400 UNITS TABS Take 2,000 Units by mouth daily.    Marland Kitchen COUMADIN 5 MG tablet Take as directed by coumadin clinic 90 tablet 1  . diltiazem (CARDIZEM CD) 120 MG 24 hr capsule Take 1 capsule (120 mg total) by mouth daily. 90 capsule 3  . LORazepam (ATIVAN) 0.5 MG tablet Take 1 tablet (0.5 mg total) by mouth at bedtime. Take 1/2 tablet daily as needed for insomnia 30 tablet 3  . simvastatin (ZOCOR) 10 MG tablet Take 1 tablet (10 mg total) by  mouth daily at 6 PM. 90 tablet 1   No current facility-administered medications on file prior to visit.    Past Medical History  Diagnosis Date  . Arrhythmia     paroxysmal atrial fibrillation  . PVC (premature ventricular contraction)   . Diverticulitis   . Hyperlipidemia   . Kidney stone   . Colon polyp   . Parathyroid disease ([place])     Parathyroidectomy   . UTI (lower urinary tract infection)     Past Surgical History  Procedure Laterality Date  . Tonsillectomy  1956  . Vein ligation and stripping    . Cholecystectomy  2002  . Appendectomy  1984  . Total abdominal hysterectomy w/ bilateral salpingoophorectomy  1984  . Rotator cuff repair  2002  . Parathyroidectomy  2004    1 removed    Social History  reports that she has never smoked. She has never used smokeless tobacco. She reports that she does not drink alcohol or use illicit drugs.  Family History family history includes Heart disease in her brother.   Review of Systems  Constitutional: Negative.   Respiratory: Negative.   Cardiovascular: Negative.   Gastrointestinal: Negative.   Musculoskeletal: Negative.   Neurological: Negative.   Hematological: Negative.   Psychiatric/Behavioral: Positive for dysphoric mood.  All other systems reviewed and are negative.  BP 126/88 mmHg  Pulse 65  Ht 5\' 3"  (1.6 m)  Wt 168 lb 8 oz (76.431 kg)  BMI 29.86 kg/m2   Physical  Exam  Constitutional: She is oriented to person, place, and time. She appears well-developed and well-nourished.  HENT:  Head: Normocephalic.  Nose: Nose normal.  Mouth/Throat: Oropharynx is clear and moist.  Eyes: Conjunctivae are normal. Pupils are equal, round, and reactive to light.  Neck: Normal range of motion. Neck supple. No JVD present.  Cardiovascular: Normal rate, S1 normal, S2 normal and intact distal pulses.  An irregularly irregular rhythm present. Exam reveals no gallop and no friction rub.   No murmur heard. Pulmonary/Chest:  Effort normal and breath sounds normal. No respiratory distress. She has no wheezes. She has no rales. She exhibits no tenderness.  Abdominal: Soft. Bowel sounds are normal. She exhibits no distension. There is no tenderness.  Musculoskeletal: Normal range of motion. She exhibits no edema or tenderness.  Lymphadenopathy:    She has no cervical adenopathy.  Neurological: She is alert and oriented to person, place, and time. Coordination normal.  Skin: Skin is warm and dry. No rash noted. No erythema.  Psychiatric: She has a normal mood and affect. Her behavior is normal. Judgment and thought content normal.    Assessment and Plan  Nursing note and vitals reviewed.

## 2015-05-11 NOTE — Assessment & Plan Note (Addendum)
Blood pressure is well controlled on today's visit. No changes made to the medications. she does report having high blood pressure sometimes in the middle of the night. Likely malaise causing high blood pressure not the other way around.  She'll take diltiazem 30 mg pill as needed for high blood pressure  That does not improve over time  On recheck

## 2015-05-11 NOTE — Patient Instructions (Addendum)
You are doing well. No medication changes were made.  If blood pressure runs high, Take a diltiazem 30 mg pill as needed  Zetia goes generic in March, cholesterol pill   Please call us if you have new issues that need to be addressed before your next appt.  Your physician wants you to follow-up in: 6 months.  You will receive a reminder letter in the mail two months in advance. If you don't receive a letter, please call our office to schedule the follow-up appointment.

## 2015-05-11 NOTE — Assessment & Plan Note (Signed)
Chronic/persistent atrial fibrillation  Tolerating anticoagulation , rate well controlled  she presents with a clipping from a paper, he discussed the Watchman  Device in detail  Likely not a candidate as she is tolerating anticoagulation

## 2015-05-11 NOTE — Assessment & Plan Note (Signed)
She reports being unable to tolerate high-dose cholesterol pill.  Recommended she tried to take as much low-dose simvastatin as she can tolerate  We will add Zetia  Daily  When this goes generic in March

## 2015-05-25 ENCOUNTER — Other Ambulatory Visit: Payer: Self-pay

## 2015-05-25 MED ORDER — DILTIAZEM HCL ER COATED BEADS 120 MG PO CP24: 120.0000 mg | ORAL_CAPSULE | Freq: Every day | ORAL | Status: DC

## 2015-06-09 ENCOUNTER — Telehealth: Payer: Self-pay | Admitting: *Deleted

## 2015-06-09 ENCOUNTER — Ambulatory Visit (INDEPENDENT_AMBULATORY_CARE_PROVIDER_SITE_OTHER): Payer: Medicare Other

## 2015-06-09 DIAGNOSIS — Z7901 Long term (current) use of anticoagulants: Secondary | ICD-10-CM | POA: Diagnosis not present

## 2015-06-09 DIAGNOSIS — Z5181 Encounter for therapeutic drug level monitoring: Secondary | ICD-10-CM | POA: Diagnosis not present

## 2015-06-09 DIAGNOSIS — I4891 Unspecified atrial fibrillation: Secondary | ICD-10-CM

## 2015-06-09 LAB — POCT INR: INR: 3.4

## 2015-06-09 NOTE — Telephone Encounter (Signed)
Spoke w/ pt.  She reports that for the past 2-3 weeks, she has felt dizzy when she moves her head while lying down. Reports that if she moves her head sideways, it feels like she "passes out for a little while". Pt has not checked BP during these episodes, as she is afraid to get out of bed to do so.  Pt's meds have not been adjusted recently. Pt does have a h/o vertigo, but reports that her sx were much worse. She tried to get an appt w/ ENT, but they did not have any openings. She states that when she was in the office this am for coumadin check, she picked up a pamphlet about valve disease and that she may have some of the sx. She reports a "bee sting sensation" behind her left breast occasionally.  States that she discussed this w/ Dr. Rockey Situ previously and was advised this is not cardiac related. Advised pt to monitor her BP and all of her sx, write them in a journal and keep track of what she is doing when sx occur and to be as descriptive as possible.  She is agreeable to this and will call next week if her sx have not improved.

## 2015-06-09 NOTE — Telephone Encounter (Signed)
Attempted to contact pt.  No answer, voice mail box is full.  Unable to leave message.

## 2015-06-09 NOTE — Telephone Encounter (Signed)
Pt c/o Syncope: STAT if syncope occurred within 30 minutes and pt complains of lightheadedness High Priority if episode of passing out, completely, today or in last 24 hours   1. Did you pass out today? no  2. When is the last time you passed out? Haven't passed out just feeling dizzy  3. Has this occurred multiple times? Just feeling dizzy for past 3 weeks in the am and pm.  Patient worried it might be her medication and she isn't sure what to do???  4. Did you have any symptoms prior to passing out? no 5.

## 2015-07-02 ENCOUNTER — Encounter: Payer: Self-pay | Admitting: Internal Medicine

## 2015-07-02 ENCOUNTER — Ambulatory Visit (INDEPENDENT_AMBULATORY_CARE_PROVIDER_SITE_OTHER): Payer: Medicare Other | Admitting: Internal Medicine

## 2015-07-02 VITALS — BP 138/78 | HR 62 | Temp 97.7°F | Resp 12 | Ht 63.0 in | Wt 172.2 lb

## 2015-07-02 DIAGNOSIS — R221 Localized swelling, mass and lump, neck: Secondary | ICD-10-CM | POA: Diagnosis not present

## 2015-07-02 DIAGNOSIS — R55 Syncope and collapse: Secondary | ICD-10-CM | POA: Diagnosis not present

## 2015-07-02 DIAGNOSIS — D582 Other hemoglobinopathies: Secondary | ICD-10-CM

## 2015-07-02 DIAGNOSIS — E785 Hyperlipidemia, unspecified: Secondary | ICD-10-CM

## 2015-07-02 DIAGNOSIS — R7301 Impaired fasting glucose: Secondary | ICD-10-CM | POA: Diagnosis not present

## 2015-07-02 DIAGNOSIS — R002 Palpitations: Secondary | ICD-10-CM | POA: Diagnosis not present

## 2015-07-02 LAB — LIPID PANEL
CHOL/HDL RATIO: 2
Cholesterol: 163 mg/dL (ref 0–200)
HDL: 71.7 mg/dL (ref >39.00)
LDL CALC: 81 mg/dL (ref 0–99)
NONHDL: 91.69
TRIGLYCERIDES: 55 mg/dL (ref 0.0–149.0)
VLDL: 11 mg/dL (ref 0.0–40.0)

## 2015-07-02 LAB — COMPREHENSIVE METABOLIC PANEL
ALBUMIN: 4.1 g/dL (ref 3.5–5.2)
ALT: 21 U/L (ref 0–35)
AST: 24 U/L (ref 0–37)
Alkaline Phosphatase: 61 U/L (ref 39–117)
BILIRUBIN TOTAL: 1.2 mg/dL (ref 0.2–1.2)
BUN: 16 mg/dL (ref 6–23)
CALCIUM: 9.3 mg/dL (ref 8.4–10.5)
CHLORIDE: 102 meq/L (ref 96–112)
CO2: 31 meq/L (ref 19–32)
CREATININE: 0.84 mg/dL (ref 0.40–1.20)
GFR: 69.4 mL/min (ref >60.00)
Glucose, Bld: 124 mg/dL — ABNORMAL HIGH (ref 70–99)
Potassium: 3.8 mEq/L (ref 3.5–5.1)
SODIUM: 140 meq/L (ref 135–145)
Total Protein: 7 g/dL (ref 6.0–8.3)

## 2015-07-02 LAB — CBC WITH DIFFERENTIAL/PLATELET
BASOS ABS: 0 10*3/uL (ref 0.0–0.1)
Basophils Relative: 0.8 % (ref 0.0–3.0)
EOS ABS: 0.1 10*3/uL (ref 0.0–0.7)
Eosinophils Relative: 1 % (ref 0.0–5.0)
HCT: 49.3 % — ABNORMAL HIGH (ref 36.0–46.0)
Hemoglobin: 16.4 g/dL — ABNORMAL HIGH (ref 12.0–15.0)
LYMPHS ABS: 1.5 10*3/uL (ref 0.7–4.0)
Lymphocytes Relative: 27.4 % (ref 12.0–46.0)
MCHC: 33.2 g/dL (ref 30.0–36.0)
MCV: 94.4 fl (ref 78.0–100.0)
MONO ABS: 0.5 10*3/uL (ref 0.1–1.0)
Monocytes Relative: 8.9 % (ref 3.0–12.0)
NEUTROS ABS: 3.3 10*3/uL (ref 1.4–7.7)
NEUTROS PCT: 61.9 % (ref 43.0–77.0)
PLATELETS: 187 10*3/uL (ref 150.0–400.0)
RBC: 5.23 Mil/uL — AB (ref 3.87–5.11)
RDW: 14.2 % (ref 11.5–15.5)
WBC: 5.3 10*3/uL (ref 4.0–10.5)

## 2015-07-02 LAB — HEMOGLOBIN A1C: HEMOGLOBIN A1C: 6.3 % (ref 4.6–6.5)

## 2015-07-02 LAB — MICROALBUMIN / CREATININE URINE RATIO
Creatinine,U: 187.9 mg/dL
MICROALB UR: 8 mg/dL — AB (ref 0.0–1.9)
Microalb Creat Ratio: 4.3 mg/g (ref 0.0–30.0)

## 2015-07-02 NOTE — Patient Instructions (Signed)
Please suspend your Diltiazem CD for now.  Use the short acting diltiazem if needed for HR > 100  Continue your regular exercise routine unless you start having dizziness  Or shortness of breath with exercise.  Return to me in one week or earlier if schedule allows.    I am ordering an ultrasound of your neck to evaluate the swelling on the right side. The office will call you with the apointment.  It does not require sedation or fasting.

## 2015-07-02 NOTE — Assessment & Plan Note (Addendum)
Appears to be secondary to bradycardia vs elevated hemoglobin on CBC today .  She is not orthostatic. Advised to suspend long acting Diltiazem  and use short acting prnonly for  HR > 100.  EKG today unchanged from prior .

## 2015-07-02 NOTE — Progress Notes (Signed)
Subjective:  Patient ID: Linda [name], female    DOB: [birth year]  Age: 82 y.o. MRN: [id number]  CC: The primary encounter diagnosis was Pre-syncope. Diagnoses of Palpitations, Mass of right side of neck, Impaired fasting glucose, Hyperlipidemia LDL goal <100, and Elevated hemoglobin (HCC) were also pertinent to this visit.  HPI [facility] presents for new onset "dizziness"  .  The symptoms occur when she changes position from sitting to lying and last 1-2 seconds.  The feeling is intense and described as a feeling like she is going to pass out, but does not endorse  true vertigo. It is not brought on with exertion , as she has been exercising at the gym (walking on the treadmill) .  She has chronic atrial fib and has been taking Cardizem CD 120 mg QHs.    Has been checking BP at home and  There have been no fluctuations., but pulse has been "all over the place."   Last dose of diltiazem CD was last evening 8 pm .  No recent changes,  No use of short acting lately   2) Right sided neck mass x 2 -3 months , non tender .  "feels like fat".  No recent viral URI .  Has some chronic nasal drainage in the morning,  No headache or facial pain or congestion,,  Does not use any nasal prays.  Sees Will Bonnet for ear cleaning.  Has a nose bleed on the right a year ago,  Required cauterization .  No history of tobacco use .     Outpatient Prescriptions Prior to Visit  Medication Sig Dispense Refill  . cholecalciferol (VITAMIN D) 400 UNITS TABS Take 2,000 Units by mouth daily.    Marland Kitchen COUMADIN 5 MG tablet Take as directed by coumadin clinic 90 tablet 1  . diltiazem (CARDIZEM CD) 120 MG 24 hr capsule Take 1 capsule (120 mg total) by mouth daily. 90 capsule 3  . diltiazem (CARDIZEM) 30 MG tablet Take 1 tablet (30 mg total) by mouth 3 (three) times daily as needed. 90 tablet 11  . LORazepam (ATIVAN) 0.5 MG tablet Take 1 tablet (0.5 mg total) by mouth at bedtime. Take 1/2 tablet daily as needed for  insomnia 30 tablet 3  . simvastatin (ZOCOR) 10 MG tablet Take 1 tablet (10 mg total) by mouth daily at 6 PM. 90 tablet 1   No facility-administered medications prior to visit.    Review of Systems;  Patient denies headache, fevers, malaise, unintentional weight loss, skin rash, eye pain, sinus congestion and sinus pain, sore throat, dysphagia,  hemoptysis , cough, dyspnea, wheezing, chest pain, palpitations, orthopnea, edema, abdominal pain, nausea, melena, diarrhea, constipation, flank pain, dysuria, hematuria, urinary  Frequency, nocturia, numbness, tingling, seizures,  Focal weakness, Loss of consciousness,  Tremor, insomnia, depression, anxiety, and suicidal ideation.      Objective:  BP 138/78 mmHg  Pulse 62  Temp(Src) 97.7 F (36.5 C) (Oral)  Resp 12  Ht 5\' 3"  (1.6 m)  Wt 172 lb 4 oz (78.132 kg)  BMI 30.52 kg/m2  SpO2 97%  BP Readings from Last 3 Encounters:  07/02/15 138/78  05/11/15 126/88  01/19/15 110/78    Wt Readings from Last 3 Encounters:  07/02/15 172 lb 4 oz (78.132 kg)  05/11/15 168 lb 8 oz (76.431 kg)  01/19/15 168 lb (76.204 kg)    General appearance: alert, cooperative and appears stated age Ears: normal TM's and external ear canals both ears Throat: lips,  mucosa, and tongue normal; teeth and gums normal Neck: right sided neck mass near carotid bulb.  Non tender   no carotid bruit, supple,  trachea midline and thyroid not enlarged, symmetric, Back: symmetric, no curvature. ROM normal. No CVA tenderness. Lungs: clear to auscultation bilaterally Heart: bradycardiac and irregular,  S1, S2 normal, no murmur, click, rub or gallop Abdomen: soft, non-tender; bowel sounds normal; no masses,  no organomegaly Pulses: 2+ and symmetric Skin: Skin color, texture, turgor normal. No rashes or lesions Lymph nodes: Cervical, supraclavicular, and axillary nodes normal.  Lab Results  Component Value Date   HGBA1C 6.3 07/02/2015   HGBA1C 6.2 01/13/2014   HGBA1C 6.1  09/07/2009    Lab Results  Component Value Date   CREATININE 0.84 07/02/2015   CREATININE 0.80 01/12/2015   CREATININE 0.73 08/06/2014    Lab Results  Component Value Date   WBC 5.3 07/02/2015   HGB 16.4* 07/02/2015   HCT 49.3* 07/02/2015   PLT 187.0 07/02/2015   GLUCOSE 124* 07/02/2015   CHOL 163 07/02/2015   TRIG 55.0 07/02/2015   HDL 71.70 07/02/2015   LDLDIRECT 67.7 09/07/2009   LDLCALC 81 07/02/2015   ALT 21 07/02/2015   AST 24 07/02/2015   NA 140 07/02/2015   K 3.8 07/02/2015   CL 102 07/02/2015   CREATININE 0.84 07/02/2015   BUN 16 07/02/2015   CO2 31 07/02/2015   TSH 1.42 01/12/2015   INR 3.4 06/09/2015   HGBA1C 6.3 07/02/2015   MICROALBUR 8.0* 07/02/2015    No results found.  Assessment & Plan:   Problem List Items Addressed This Visit    Pre-syncope - Primary    Appears to be secondary to bradycardia, sick sinus syndrome vs hyperviscosity syndrome given elevated hemoglobin on CBC today .  She is not orthostatic. Advised to suspend long acting Diltiazem  and use short acting prnonly for  HR > 100.  EKG today unchanged from prior .      Relevant Orders   CBC with Differential/Platelet (Completed)   Mass of right side of neck    Ultrasound of neck ordered.  Will follow up with Tami Ribas       Relevant Orders   US Soft Tissue Head/Neck   Elevated hemoglobin (HCC)    Given elevation which has persisted on repeat and is nearing 17, along with presyncope, referrring to Oncology for further evaluation.       Relevant Orders   Ambulatory referral to Hematology    Other Visit Diagnoses    Palpitations        Relevant Orders    EKG 12-Lead (Completed)    Impaired fasting glucose        Relevant Orders    Comprehensive metabolic panel (Completed)    Microalbumin / creatinine urine ratio (Completed)    Hyperlipidemia LDL goal <100        Relevant Orders    Lipid panel (Completed)      A total of 40 minutes was spent with patient more than half of  which was spent in counseling patient on the above mentioned issues , reviewing and explaining recent labs and imaging studies done, and coordination of care.  I am having Ms. Masten maintain her cholecalciferol, LORazepam, COUMADIN, simvastatin, diltiazem, diltiazem, and Co Q 10.  Meds ordered this encounter  Medications  . Coenzyme Q10 (CO Q 10) 10 MG CAPS    Sig: Take 1 capsule by mouth 2 (two) times daily.    There are  no discontinued medications.  Follow-up: Return in about 1 week (around 07/09/2015), or OR LESS IF APPT AVAILABLE .   Crecencio Mc, MD

## 2015-07-02 NOTE — Progress Notes (Signed)
Pre-visit discussion using our clinic review tool. No additional management support is needed unless otherwise documented below in the visit note.  

## 2015-07-04 ENCOUNTER — Encounter: Payer: Self-pay | Admitting: Internal Medicine

## 2015-07-04 DIAGNOSIS — D751 Secondary polycythemia: Secondary | ICD-10-CM | POA: Insufficient documentation

## 2015-07-04 NOTE — Assessment & Plan Note (Signed)
Ultrasound of neck ordered.  Will follow up with Tami Ribas

## 2015-07-04 NOTE — Assessment & Plan Note (Signed)
Given elevation which has persisted on repeat and is nearing 17, along with presyncope, referrring to Oncology for further evaluation.

## 2015-07-05 ENCOUNTER — Other Ambulatory Visit: Payer: Self-pay | Admitting: Internal Medicine

## 2015-07-05 ENCOUNTER — Encounter: Payer: Self-pay | Admitting: Internal Medicine

## 2015-07-05 MED ORDER — CIPROFLOXACIN HCL 250 MG PO TABS: 250.0000 mg | ORAL_TABLET | Freq: Two times a day (BID) | ORAL | Status: DC

## 2015-07-06 ENCOUNTER — Telehealth: Payer: Self-pay | Admitting: *Deleted

## 2015-07-06 ENCOUNTER — Other Ambulatory Visit: Payer: Self-pay | Admitting: Internal Medicine

## 2015-07-06 NOTE — Telephone Encounter (Signed)
*  STAT* If patient is at the pharmacy, call can be transferred to refill team.   1. Which medications need to be refilled? (please list name of each medication and dose if known) Crestor 5mg  once a day  2. Which pharmacy/location (including street and city if local pharmacy) is medication to be sent to? [facility]  [address]  3. Do they need a 30 day or 90 day supply? 30 day supply  Please call patient when this has been called into pharmacy.

## 2015-07-06 NOTE — Telephone Encounter (Signed)
Spoke with pt and she mentioned that she is taking Crestor 5 mg. She is aware that I do not see crestor on her medlist or previous office visit medlist. She mentioned that she does not know why or what she should be taking but that she has been taking Crestor. Please advise.

## 2015-07-06 NOTE — Telephone Encounter (Signed)
Pt is returning phone call about her medication refill. You may call pt at home number.

## 2015-07-06 NOTE — Telephone Encounter (Signed)
Medication was discontinued, but patient was called to see if she was still using it and a voicemail was left to callback. Please advise?

## 2015-07-07 ENCOUNTER — Ambulatory Visit (INDEPENDENT_AMBULATORY_CARE_PROVIDER_SITE_OTHER): Payer: Medicare Other | Admitting: *Deleted

## 2015-07-07 DIAGNOSIS — Z7901 Long term (current) use of anticoagulants: Secondary | ICD-10-CM

## 2015-07-07 DIAGNOSIS — I4891 Unspecified atrial fibrillation: Secondary | ICD-10-CM | POA: Diagnosis not present

## 2015-07-07 DIAGNOSIS — Z5181 Encounter for therapeutic drug level monitoring: Secondary | ICD-10-CM | POA: Diagnosis not present

## 2015-07-07 LAB — POCT INR: INR: 3.2

## 2015-07-07 MED ORDER — ROSUVASTATIN CALCIUM 5 MG PO TABS: 5.0000 mg | ORAL_TABLET | Freq: Every day | ORAL | Status: DC

## 2015-07-07 NOTE — Telephone Encounter (Signed)
Ok to refill,  Refill sent  

## 2015-07-07 NOTE — Telephone Encounter (Signed)
Patient started the Crestor 5 mg back on Nov 06, 2014 according to that cholesterol result per Dr. Donivan Scull request.  Crestor 5 mg sent to her local pharmacy.

## 2015-07-09 ENCOUNTER — Encounter: Payer: Self-pay | Admitting: Internal Medicine

## 2015-07-09 ENCOUNTER — Ambulatory Visit (INDEPENDENT_AMBULATORY_CARE_PROVIDER_SITE_OTHER): Payer: Medicare Other | Admitting: Internal Medicine

## 2015-07-09 VITALS — BP 126/72 | HR 42 | Temp 98.1°F | Resp 12 | Ht 63.0 in | Wt 172.2 lb

## 2015-07-09 DIAGNOSIS — I10 Essential (primary) hypertension: Secondary | ICD-10-CM

## 2015-07-09 DIAGNOSIS — R55 Syncope and collapse: Secondary | ICD-10-CM | POA: Diagnosis not present

## 2015-07-09 DIAGNOSIS — I481 Persistent atrial fibrillation: Secondary | ICD-10-CM | POA: Diagnosis not present

## 2015-07-09 DIAGNOSIS — D582 Other hemoglobinopathies: Secondary | ICD-10-CM

## 2015-07-09 DIAGNOSIS — I4819 Other persistent atrial fibrillation: Secondary | ICD-10-CM

## 2015-07-09 MED ORDER — LOSARTAN POTASSIUM 50 MG PO TABS: 50.0000 mg | ORAL_TABLET | Freq: Every day | ORAL | Status: DC

## 2015-07-09 NOTE — Progress Notes (Signed)
Subjective:  Patient ID: Linda [name], female    DOB: 03-27-1934  Age: 82 y.o. MRN: [id number]  CC: The primary encounter diagnosis was Persistent atrial fibrillation ([place]). Diagnoses of Pre-syncope, Essential hypertension, and Elevated hemoglobin (HCC) were also pertinent to this visit.  HPI [facility] presents for follow up on presyncope and bradycardia, reported last week. Diltiazem CD was suspended.  Patient resumed Diltiazem CD last night for reportedly  having a home BP of 148/205 (yes 205 per patient ).   She has been having a headache and feeling fatigued. . Has a history of intolerance to Toprol .  Has not heard from Dr Donivan Scull office yet   2) Elevated hgb:  Since notification she was been reading excessively on the internet about polycythemia and is very worried that her symptoms are due to the polycythemia. She has an appointment on Monday with a hematologist at [facility] .     Outpatient Prescriptions Prior to Visit  Medication Sig Dispense Refill  . cholecalciferol (VITAMIN D) 400 UNITS TABS Take 2,000 Units by mouth daily.    . ciprofloxacin (CIPRO) 250 MG tablet Take 1 tablet (250 mg total) by mouth 2 (two) times daily. 10 tablet 0  . Coenzyme Q10 (CO Q 10) 10 MG CAPS Take 1 capsule by mouth 2 (two) times daily.    Marland Kitchen COUMADIN 5 MG tablet Take as directed by coumadin clinic 90 tablet 1  . diltiazem (CARDIZEM CD) 120 MG 24 hr capsule Take 1 capsule (120 mg total) by mouth daily. 90 capsule 3  . LORazepam (ATIVAN) 0.5 MG tablet Take 1 tablet (0.5 mg total) by mouth at bedtime. Take 1/2 tablet daily as needed for insomnia 30 tablet 3  . rosuvastatin (CRESTOR) 5 MG tablet Take 1 tablet (5 mg total) by mouth daily. 90 tablet 3  . diltiazem (CARDIZEM) 30 MG tablet Take 1 tablet (30 mg total) by mouth 3 (three) times daily as needed. (Patient not taking: Reported on 07/09/2015) 90 tablet 11  . CRESTOR 5 MG tablet TAKE 1 TABLET (5 MG TOTAL) BY MOUTH DAILY. 30 tablet 5   No  facility-administered medications prior to visit.    Review of Systems;  Patient denies  fevers, malaise, unintentional weight loss, skin rash, eye pain, sinus congestion and sinus pain, sore throat, dysphagia,  hemoptysis , cough, dyspnea, wheezing, chest pain, palpitations, orthopnea, edema, abdominal pain, nausea, melena, diarrhea, constipation, flank pain, dysuria, hematuria, urinary  Frequency, nocturia, numbness, tingling, seizures,  Focal weakness, Loss of consciousness,  Tremor, insomnia, depression, anxiety, and suicidal ideation.      Objective:  BP 126/72 mmHg  Pulse 42  Temp(Src) 98.1 F (36.7 C) (Oral)  Resp 12  Ht 5\' 3"  (1.6 m)  Wt 172 lb 4 oz (78.132 kg)  BMI 30.52 kg/m2  SpO2 98%  BP Readings from Last 3 Encounters:  07/09/15 126/72  07/02/15 138/78  05/11/15 126/88    Wt Readings from Last 3 Encounters:  07/09/15 172 lb 4 oz (78.132 kg)  07/02/15 172 lb 4 oz (78.132 kg)  05/11/15 168 lb 8 oz (76.431 kg)    General appearance: alert, cooperative and appears stated age Ears: normal TM's and external ear canals both ears Throat: lips, mucosa, and tongue normal; teeth and gums normal Neck: no adenopathy, no carotid bruit, supple, symmetrical, trachea midline and thyroid not enlarged, symmetric, no tenderness/mass/nodules Back: symmetric, no curvature. ROM normal. No CVA tenderness. Lungs: clear to auscultation bilaterally Heart: regular rate and rhythm,  S1, S2 normal, no murmur, click, rub or gallop Abdomen: soft, non-tender; bowel sounds normal; no masses,  no organomegaly Pulses: 2+ and symmetric Skin: Skin color, texture, turgor normal. No rashes or lesions Lymph nodes: Cervical, supraclavicular, and axillary nodes normal.  Lab Results  Component Value Date   HGBA1C 6.3 07/02/2015   HGBA1C 6.2 01/13/2014   HGBA1C 6.1 09/07/2009    Lab Results  Component Value Date   CREATININE 0.84 07/02/2015   CREATININE 0.80 01/12/2015   CREATININE 0.73  08/06/2014    Lab Results  Component Value Date   WBC 5.3 07/02/2015   HGB 16.4* 07/02/2015   HCT 49.3* 07/02/2015   PLT 187.0 07/02/2015   GLUCOSE 124* 07/02/2015   CHOL 163 07/02/2015   TRIG 55.0 07/02/2015   HDL 71.70 07/02/2015   LDLDIRECT 67.7 09/07/2009   LDLCALC 81 07/02/2015   ALT 21 07/02/2015   AST 24 07/02/2015   NA 140 07/02/2015   K 3.8 07/02/2015   CL 102 07/02/2015   CREATININE 0.84 07/02/2015   BUN 16 07/02/2015   CO2 31 07/02/2015   TSH 1.42 01/12/2015   INR 3.2 07/07/2015   HGBA1C 6.3 07/02/2015   MICROALBUR 8.0* 07/02/2015    No results found.  Assessment & Plan:   Problem List Items Addressed This Visit    ATRIAL FIBRILLATION - Primary    Currently bradycardic (recurrently) on low dose Diltiazem CD, with prior intolerances to Toprol as well.  Stopping Dilt (again). Which patient  resumed for high blood pressure. appt made with her cardiologist to consider Holter monitor to determine need for alternative therapy.       Relevant Medications   losartan (COZAAR) 50 MG tablet   Essential hypertension    Starting losartan 50 mg daily,  Can increase to 100 mg for sbp> 160 but advised to otherwise wait a week before adjusting dose.         Relevant Medications   losartan (COZAAR) 50 MG tablet   Pre-syncope    Secondary to bradycardia vs polycythemia, etiology unclear.  symptoms are brought on with supine position.       Relevant Medications   losartan (COZAAR) 50 MG tablet   Elevated hemoglobin (HCC)    Primary, no history of tobacco abuse.  Hematology evaluation in process.  CBC Latest Ref Rng 07/02/2015 01/12/2015 08/06/2014  WBC 4.0 - 10.5 K/uL 5.3 4.3 9.1  Hemoglobin 12.0 - 15.0 g/dL 16.4(H) 16.0(H) 14.9  Hematocrit 36.0 - 46.0 % 49.3(H) 46.9(H) 43.7  Platelets 150.0 - 400.0 K/uL 187.0 186.0 187.0            I have discontinued [name]'s CRESTOR. I am also having her start on losartan. Additionally, I am having her maintain her  cholecalciferol, LORazepam, COUMADIN, diltiazem, diltiazem, Co Q 10, ciprofloxacin, and rosuvastatin.  Meds ordered this encounter  Medications  . losartan (COZAAR) 50 MG tablet    Sig: Take 1 tablet (50 mg total) by mouth daily.    Dispense:  30 tablet    Refill:  0    Medications Discontinued During This Encounter  Medication Reason  . CRESTOR 5 MG tablet Duplicate    Follow-up: Return in about 3 months (around 10/07/2015) for follow up diabetes.   Crecencio Mc, MD

## 2015-07-09 NOTE — Patient Instructions (Addendum)
I am prescribing a medicati nfor your blood pressure called losartan.  Start the losartan anytime today if your BP is > 140/80. Otherwise, start it tonight at 8:00  No more long  Acting diltiazem! It is slowing your heart rate down too much   To check your pulse :   Count the number of beats in 10 seconds and multiply by 6 to get your pulse

## 2015-07-09 NOTE — Progress Notes (Signed)
Pre-visit discussion using our clinic review tool. No additional management support is needed unless otherwise documented below in the visit note.  

## 2015-07-11 NOTE — Assessment & Plan Note (Signed)
Secondary to bradycardia vs polycythemia, etiology unclear.  symptoms are brought on with supine position.

## 2015-07-11 NOTE — Assessment & Plan Note (Signed)
Primary, no history of tobacco abuse.  Hematology evaluation in process.  CBC Latest Ref Rng 07/02/2015 01/12/2015 08/06/2014  WBC 4.0 - 10.5 K/uL 5.3 4.3 9.1  Hemoglobin 12.0 - 15.0 g/dL 16.4(H) 16.0(H) 14.9  Hematocrit 36.0 - 46.0 % 49.3(H) 46.9(H) 43.7  Platelets 150.0 - 400.0 K/uL 187.0 186.0 187.0

## 2015-07-11 NOTE — Assessment & Plan Note (Signed)
Starting losartan 50 mg daily,  Can increase to 100 mg for sbp> 160 but advised to otherwise wait a week before adjusting dose.

## 2015-07-11 NOTE — Assessment & Plan Note (Signed)
Currently bradycardic (recurrently) on low dose Diltiazem CD, with prior intolerances to Toprol as well.  Stopping Dilt (again). Which patient  resumed for high blood pressure. appt made with her cardiologist to consider Holter monitor to determine need for alternative therapy.

## 2015-07-12 ENCOUNTER — Encounter: Payer: Self-pay | Admitting: Internal Medicine

## 2015-07-12 ENCOUNTER — Inpatient Hospital Stay: Payer: Medicare Other

## 2015-07-12 ENCOUNTER — Ambulatory Visit: Payer: Medicare Other

## 2015-07-12 ENCOUNTER — Inpatient Hospital Stay: Payer: Medicare Other | Attending: Internal Medicine | Admitting: Internal Medicine

## 2015-07-12 VITALS — BP 154/86 | HR 82 | Temp 97.6°F | Resp 18 | Ht 63.0 in | Wt 175.5 lb

## 2015-07-12 DIAGNOSIS — E785 Hyperlipidemia, unspecified: Secondary | ICD-10-CM | POA: Insufficient documentation

## 2015-07-12 DIAGNOSIS — I4891 Unspecified atrial fibrillation: Secondary | ICD-10-CM | POA: Diagnosis not present

## 2015-07-12 DIAGNOSIS — I10 Essential (primary) hypertension: Secondary | ICD-10-CM | POA: Insufficient documentation

## 2015-07-12 DIAGNOSIS — Z86010 Personal history of colonic polyps: Secondary | ICD-10-CM | POA: Insufficient documentation

## 2015-07-12 DIAGNOSIS — I4819 Other persistent atrial fibrillation: Secondary | ICD-10-CM

## 2015-07-12 DIAGNOSIS — D751 Secondary polycythemia: Secondary | ICD-10-CM

## 2015-07-12 DIAGNOSIS — R531 Weakness: Secondary | ICD-10-CM

## 2015-07-12 DIAGNOSIS — Z79899 Other long term (current) drug therapy: Secondary | ICD-10-CM | POA: Diagnosis not present

## 2015-07-12 DIAGNOSIS — G4733 Obstructive sleep apnea (adult) (pediatric): Secondary | ICD-10-CM | POA: Insufficient documentation

## 2015-07-12 DIAGNOSIS — Z7901 Long term (current) use of anticoagulants: Secondary | ICD-10-CM | POA: Diagnosis not present

## 2015-07-12 DIAGNOSIS — R51 Headache: Secondary | ICD-10-CM | POA: Insufficient documentation

## 2015-07-12 DIAGNOSIS — Z87442 Personal history of urinary calculi: Secondary | ICD-10-CM | POA: Diagnosis not present

## 2015-07-12 LAB — CBC WITH DIFFERENTIAL/PLATELET
BASOS ABS: 0.1 10*3/uL (ref 0–0.1)
BASOS PCT: 1 %
EOS PCT: 1 %
Eosinophils Absolute: 0 10*3/uL (ref 0–0.7)
HCT: 48.8 % — ABNORMAL HIGH (ref 35.0–47.0)
Hemoglobin: 16.3 g/dL — ABNORMAL HIGH (ref 12.0–16.0)
LYMPHS PCT: 24 %
Lymphs Abs: 1.3 10*3/uL (ref 1.0–3.6)
MCH: 31.1 pg (ref 26.0–34.0)
MCHC: 33.4 g/dL (ref 32.0–36.0)
MCV: 93.1 fL (ref 80.0–100.0)
MONO ABS: 0.5 10*3/uL (ref 0.2–0.9)
Monocytes Relative: 10 %
Neutro Abs: 3.5 10*3/uL (ref 1.4–6.5)
Neutrophils Relative %: 64 %
PLATELETS: 178 10*3/uL (ref 150–440)
RBC: 5.24 MIL/uL — ABNORMAL HIGH (ref 3.80–5.20)
RDW: 14.2 % (ref 11.5–14.5)
WBC: 5.4 10*3/uL (ref 3.6–11.0)

## 2015-07-12 NOTE — Progress Notes (Signed)
[place] @ [facility] [phone]  Fax:([phone]) [place]: 09-17-1933  MR#: [id number]  [id number]  Patient Care Team: Crecencio Mc, MD as PCP - General (Internal Medicine)  CHIEF COMPLAINT:  Chief Complaint  Patient presents with  . erythrocytosis     No history exists.    No flowsheet data found.  HISTORY OF PRESENT ILLNESS:   Mrs. Linda [name] is a 82 year old Caucasian female, who is referred to our clinic for evaluation of erythrocytosis. She was found to have elevated hemoglobin at 16.4 g/dL in late 2016. We do not have her baseline hemoglobin from several years ago, but in 2011 for hemoglobin was 15 g/dL. She denies any cough, shortness of breath, wheezing or other symptoms, which would suggest chronic obstructive lung disease/emphysema. She claims that she was evaluated many years ago for obstructive sleep apnea, and was told that she did not need treatment at that time. She is not certain whether she snores are not. She denies skin itching after taking showers. She denies redness, pain or swelling of palms or soles. Occasionally, she has headaches, which likely is related to hypertension. She denies weight loss, night sweats, fevers, chills, nausea, vomiting, diarrhea, constipation. She is up-to-date with mammogram and colonoscopy.  REVIEW OF SYSTEMS:   Review of Systems  All other systems reviewed and are negative.    PAST MEDICAL HISTORY: Past Medical History  Diagnosis Date  . Arrhythmia     paroxysmal atrial fibrillation  . PVC (premature ventricular contraction)   . Diverticulitis   . Hyperlipidemia   . Kidney stone   . Colon polyp   . Parathyroid disease ([place])     Parathyroidectomy   . UTI (lower urinary tract infection)     PAST SURGICAL HISTORY: Past Surgical History  Procedure Laterality Date  . Tonsillectomy  1956  . Vein ligation and stripping    . Cholecystectomy  2002  . Appendectomy  1984  . Total abdominal  hysterectomy w/ bilateral salpingoophorectomy  1984  . Rotator cuff repair  2002  . Parathyroidectomy  2004    1 removed    FAMILY HISTORY Family History  Problem Relation Age of Onset  . Heart disease Brother     ADVANCED DIRECTIVES:  No flowsheet data found.  HEALTH MAINTENANCE: Social History  Substance Use Topics  . Smoking status: Never Smoker   . Smokeless tobacco: Never Used  . Alcohol Use: No     Allergies  Allergen Reactions  . Crestor [Rosuvastatin Calcium] Other (See Comments)    Leg pain.  Marland Kitchen Penicillins   . Sulfonamide Derivatives     Current Outpatient Prescriptions  Medication Sig Dispense Refill  . cholecalciferol (VITAMIN D) 400 UNITS TABS Take 2,000 Units by mouth daily.    . ciprofloxacin (CIPRO) 250 MG tablet Take 1 tablet (250 mg total) by mouth 2 (two) times daily. 10 tablet 0  . Coenzyme Q10 (CO Q 10) 10 MG CAPS Take 1 capsule by mouth 2 (two) times daily.    Marland Kitchen COUMADIN 5 MG tablet Take as directed by coumadin clinic 90 tablet 1  . diltiazem (CARDIZEM CD) 120 MG 24 hr capsule Take 1 capsule (120 mg total) by mouth daily. 90 capsule 3  . diltiazem (CARDIZEM) 30 MG tablet Take 1 tablet (30 mg total) by mouth 3 (three) times daily as needed. (Patient not taking: Reported on 07/09/2015) 90 tablet 11  . LORazepam (ATIVAN) 0.5 MG tablet Take 1 tablet (0.5 mg  total) by mouth at bedtime. Take 1/2 tablet daily as needed for insomnia 30 tablet 3  . losartan (COZAAR) 50 MG tablet Take 1 tablet (50 mg total) by mouth daily. 30 tablet 0  . rosuvastatin (CRESTOR) 5 MG tablet Take 1 tablet (5 mg total) by mouth daily. 90 tablet 3   No current facility-administered medications for this visit.    OBJECTIVE:  Filed Vitals:   07/12/15 1004  BP: 154/86  Pulse: 82  Temp: 97.6 F (36.4 C)  Resp: 18     Body mass index is 31.09 kg/(m^2).    ECOG FS:0 - Asymptomatic  Physical Exam  Constitutional: She is oriented to person, place, and time and well-developed,  well-nourished, and in no distress. No distress.  Age appropriate elderly Caucasian female  HENT:  Head: Normocephalic and atraumatic.  Right Ear: External ear normal.  Left Ear: External ear normal.  Mouth/Throat: Oropharynx is clear and moist.  Eyes: Conjunctivae are normal. Pupils are equal, round, and reactive to light. Right eye exhibits no discharge. Left eye exhibits no discharge. No scleral icterus.  Neck: Normal range of motion. Neck supple. No JVD present. No tracheal deviation present. No thyromegaly present.  Cardiovascular: Normal rate, regular rhythm, normal heart sounds and intact distal pulses.  Exam reveals no gallop and no friction rub.   No murmur heard. Pulmonary/Chest: Effort normal and breath sounds normal. No stridor. No respiratory distress. She has no wheezes. She has no rales. She exhibits no tenderness.  Abdominal: Soft. Bowel sounds are normal. She exhibits no distension and no mass. There is no tenderness. There is no rebound and no guarding.  No hepatosplenomegaly  Genitourinary:  Postponed  Musculoskeletal: Normal range of motion. She exhibits no edema or tenderness.  Lymphadenopathy:    She has no cervical adenopathy.  Neurological: She is alert and oriented to person, place, and time. She has normal reflexes. No cranial nerve deficit. She exhibits normal muscle tone. Gait normal. Coordination normal. GCS score is 15.  Skin: Skin is warm. No rash noted. She is not diaphoretic. No erythema. No pallor.  Psychiatric: Mood, memory, affect and judgment normal.  Nursing note and vitals reviewed.    LAB RESULTS:  CBC Latest Ref Rng 07/02/2015 01/12/2015  WBC 4.0 - 10.5 K/uL 5.3 4.3  Hemoglobin 12.0 - 15.0 g/dL 16.4(H) 16.0(H)  Hematocrit 36.0 - 46.0 % 49.3(H) 46.9(H)  Platelets 150.0 - 400.0 K/uL 187.0 186.0    No visits with results within 5 Day(s) from this visit. Latest known visit with results is:  Anti-coag visit on 07/07/2015  Component Date Value  Ref Range Status  . INR 07/07/2015 3.2   Final      STUDIES: No results found.  ASSESSMENT and MEDICAL DECISION MAKING:  Erythrocytosis-we discussed the fact that the majority of cases of erythrocytosis are secondary to underlying problems, such as chronic obstructive pulmonary disease, obesity, obstructive sleep apnea. Rarely, erythrocytosis can be a sign of chronic myeloproliferative neoplasm such as polycythemia vera. We will perform a quick workup, which should include [id number] and exon 12 mutations along with erythropoietin levels. She will return to our clinic in 2 weeks. She should continue with Coumadin for antral fibrillation. There is no need to add aspirin at this point, since it can increase the risk of bleeding. There is no need to initiate phlebotomy at this time, since polycythemia vera has not been diagnosed yet.  She will return to our clinic in 2 weeks  Patient expressed understanding and was  in agreement with this plan. She also understands that She can call clinic at any time with any questions, concerns, or complaints.    No matching staging information was found for the patient.  Roxana Hires, MD   07/12/2015 9:57 AM

## 2015-07-12 NOTE — Progress Notes (Signed)
Pt here for elevated hgb.hct.  According to pt it has been in high normal range but kept creeping up. She has never smoked and does not have sleep apnea.  She gets slight HA at times but it is related to her b/p.  She checks it at home and notices when she has HA it is usually due to b/p elevation.

## 2015-07-13 MED ORDER — LOSARTAN POTASSIUM 100 MG PO TABS: 100.0000 mg | ORAL_TABLET | Freq: Every day | ORAL | Status: DC

## 2015-07-14 ENCOUNTER — Ambulatory Visit
Admission: RE | Admit: 2015-07-14 | Discharge: 2015-07-14 | Disposition: A | Discharge status: Home / Self Care | Payer: Medicare Other | Source: Ambulatory Visit | Attending: Internal Medicine | Admitting: Internal Medicine

## 2015-07-14 ENCOUNTER — Encounter: Payer: Self-pay | Admitting: Internal Medicine

## 2015-07-14 ENCOUNTER — Telehealth: Payer: Self-pay | Admitting: Cardiovascular Disease

## 2015-07-14 DIAGNOSIS — R221 Localized swelling, mass and lump, neck: Secondary | ICD-10-CM

## 2015-07-14 NOTE — Telephone Encounter (Signed)
Patient cancelled coumadin appt for 2/1.  Patient no longer wants to be seen at this practice.  She says Rockey Situ suggested she drink a beer every morning and the nurse doesn't think she is sick enough to be seen as she has been waiting a long time on an appointment.

## 2015-07-15 ENCOUNTER — Encounter: Payer: Self-pay | Admitting: Internal Medicine

## 2015-07-20 ENCOUNTER — Encounter: Payer: Self-pay | Admitting: Internal Medicine

## 2015-07-21 ENCOUNTER — Other Ambulatory Visit: Payer: Self-pay | Admitting: Internal Medicine

## 2015-07-21 LAB — PROTIME-INR

## 2015-07-22 ENCOUNTER — Telehealth: Payer: Self-pay | Admitting: *Deleted

## 2015-07-22 ENCOUNTER — Encounter: Payer: Self-pay | Admitting: Internal Medicine

## 2015-07-22 MED ORDER — LOSARTAN POTASSIUM 50 MG PO TABS: ORAL_TABLET | ORAL | Status: DC

## 2015-07-22 NOTE — Telephone Encounter (Signed)
Phoned In

## 2015-07-22 NOTE — Telephone Encounter (Signed)
Patient has requested a medication refill for losartan (COZAAR) 100 MG tablet  Pharmacy [facility] [address].

## 2015-07-26 ENCOUNTER — Encounter: Payer: Self-pay | Admitting: Internal Medicine

## 2015-07-26 ENCOUNTER — Inpatient Hospital Stay: Payer: Medicare Other | Attending: Internal Medicine | Admitting: Internal Medicine

## 2015-07-26 VITALS — BP 119/86 | HR 42 | Temp 96.7°F | Resp 18 | Ht 63.0 in | Wt 173.0 lb

## 2015-07-26 DIAGNOSIS — Z87442 Personal history of urinary calculi: Secondary | ICD-10-CM | POA: Diagnosis not present

## 2015-07-26 DIAGNOSIS — G4733 Obstructive sleep apnea (adult) (pediatric): Secondary | ICD-10-CM | POA: Diagnosis not present

## 2015-07-26 DIAGNOSIS — Z79899 Other long term (current) drug therapy: Secondary | ICD-10-CM

## 2015-07-26 DIAGNOSIS — I480 Paroxysmal atrial fibrillation: Secondary | ICD-10-CM | POA: Diagnosis not present

## 2015-07-26 DIAGNOSIS — I10 Essential (primary) hypertension: Secondary | ICD-10-CM | POA: Diagnosis not present

## 2015-07-26 DIAGNOSIS — E785 Hyperlipidemia, unspecified: Secondary | ICD-10-CM | POA: Diagnosis not present

## 2015-07-26 DIAGNOSIS — D751 Secondary polycythemia: Secondary | ICD-10-CM

## 2015-07-26 DIAGNOSIS — Z7901 Long term (current) use of anticoagulants: Secondary | ICD-10-CM

## 2015-07-26 DIAGNOSIS — Z86010 Personal history of colonic polyps: Secondary | ICD-10-CM | POA: Diagnosis not present

## 2015-07-26 DIAGNOSIS — Z85828 Personal history of other malignant neoplasm of skin: Secondary | ICD-10-CM

## 2015-07-26 DIAGNOSIS — R51 Headache: Secondary | ICD-10-CM | POA: Diagnosis not present

## 2015-07-26 NOTE — Progress Notes (Signed)
Pt verbalizes she is here waiting on results.  No other concerns.

## 2015-07-26 NOTE — Progress Notes (Signed)
[place] @ [facility] [phone]  [phone] [place]: April 26, 1934  MR#: [id number]  [id number]  Patient Care Team: Crecencio Mc, MD as PCP - General (Internal Medicine)  CHIEF COMPLAINT:  Chief Complaint  Patient presents with  . Follow-up    Erythrocytosis     No history exists.    No flowsheet data found.  HISTORY OF PRESENT ILLNESS:   Linda [name] is a 82 year old Caucasian female, who is referred to our clinic for evaluation of erythrocytosis. She was found to have elevated hemoglobin at 16.4 g/dL in late 2016. We do not have her baseline hemoglobin from several years ago, but in 2011 for hemoglobin was 15 g/dL. She denies any cough, shortness of breath, wheezing or other symptoms, which would suggest chronic obstructive lung disease/emphysema. She claims that she was evaluated many years ago for obstructive sleep apnea, and was told that she did not need treatment at that time. She is not certain whether she snores are not. She denies skin itching after taking showers. She denies redness, pain or swelling of palms or soles. Occasionally, she has headaches, which likely is related to hypertension. She denies weight loss, night sweats, fevers, chills, nausea, vomiting, diarrhea, constipation. She is up-to-date with mammogram and colonoscopy.   Current status: Linda [name] returns to our clinic to discuss the results of the workup. She continues to feel well, without any symptoms or concerns.  REVIEW OF SYSTEMS:   Review of Systems  All other systems reviewed and are negative.    PAST MEDICAL HISTORY: Past Medical History  Diagnosis Date  . Arrhythmia     paroxysmal atrial fibrillation  . PVC (premature ventricular contraction)   . Diverticulitis   . Hyperlipidemia   . Kidney stone   . Colon polyp   . Parathyroid disease ([place])     Parathyroidectomy   . UTI (lower urinary tract infection)   . Anxiety   . Hard of hearing   . Skin  cancer     PAST SURGICAL HISTORY: Past Surgical History  Procedure Laterality Date  . Tonsillectomy  1956  . Vein ligation and stripping    . Cholecystectomy  2002  . Total abdominal hysterectomy w/ bilateral salpingoophorectomy  1984  . Rotator cuff repair  2002  . Parathyroidectomy  2004    1 removed  . Tonsillectomy    . Lithotripsy      FAMILY HISTORY Family History  Problem Relation Age of Onset  . Heart disease Brother   . Stroke Mother   . Skin cancer Father     ADVANCED DIRECTIVES:  No flowsheet data found.  HEALTH MAINTENANCE: Social History  Substance Use Topics  . Smoking status: Never Smoker   . Smokeless tobacco: Never Used  . Alcohol Use: No     Allergies  Allergen Reactions  . Crestor [Rosuvastatin Calcium] Other (See Comments)    Leg pain. With 10 mg dose or higher  . Penicillins   . Sulfonamide Derivatives     Current Outpatient Prescriptions  Medication Sig Dispense Refill  . amLODipine (NORVASC) 5 MG tablet     . Cholecalciferol (VITAMIN D) 2000 units tablet Take 2,000 Units by mouth daily.    . Coenzyme Q10 (CO Q-10) 200 MG CAPS Take 1 Dose by mouth 2 (two) times daily.    Marland Kitchen COUMADIN 5 MG tablet Take as directed by coumadin clinic 90 tablet 1  . LORazepam (ATIVAN) 0.5 MG tablet Take 1  tablet (0.5 mg total) by mouth at bedtime. Take 1/2 tablet daily as needed for insomnia 30 tablet 3  . losartan (COZAAR) 100 MG tablet Take 1 tablet (100 mg total) by mouth daily. 30 tablet 3  . omeprazole (PRILOSEC) 20 MG capsule Take 20 mg by mouth daily.    . rosuvastatin (CRESTOR) 5 MG tablet Take 1 tablet (5 mg total) by mouth daily. 90 tablet 3   No current facility-administered medications for this visit.    OBJECTIVE:  Filed Vitals:   07/26/15 1112  BP: 119/86  Pulse: 42  Temp: 96.7 F (35.9 C)  Resp: 18     Body mass index is 30.64 kg/(m^2).    ECOG FS:0 - Asymptomatic  Physical Exam  Constitutional: She is oriented to person, place, and  time and well-developed, well-nourished, and in no distress. No distress.  Age appropriate elderly Caucasian female  HENT:  Head: Normocephalic and atraumatic.  Right Ear: External ear normal.  Left Ear: External ear normal.  Mouth/Throat: Oropharynx is clear and moist.  Eyes: Conjunctivae are normal. Pupils are equal, round, and reactive to light. Right eye exhibits no discharge. Left eye exhibits no discharge. No scleral icterus.  Neck: Normal range of motion. Neck supple. No JVD present. No tracheal deviation present. No thyromegaly present.  Cardiovascular: Normal rate, regular rhythm, normal heart sounds and intact distal pulses.  Exam reveals no gallop and no friction rub.   No murmur heard. Pulmonary/Chest: Effort normal and breath sounds normal. No stridor. No respiratory distress. She has no wheezes. She has no rales. She exhibits no tenderness.  Abdominal: Soft. Bowel sounds are normal. She exhibits no distension and no mass. There is no tenderness. There is no rebound and no guarding.  No hepatosplenomegaly  Genitourinary:  Postponed  Musculoskeletal: Normal range of motion. She exhibits no edema or tenderness.  Lymphadenopathy:    She has no cervical adenopathy.  Neurological: She is alert and oriented to person, place, and time. She has normal reflexes. No cranial nerve deficit. She exhibits normal muscle tone. Gait normal. Coordination normal. GCS score is 15.  Skin: Skin is warm. No rash noted. She is not diaphoretic. No erythema. No pallor.  Psychiatric: Mood, memory, affect and judgment normal.  Nursing note and vitals reviewed.    LAB RESULTS:  CBC Latest Ref Rng 07/12/2015 07/02/2015  WBC 3.6 - 11.0 K/uL 5.4 5.3  Hemoglobin 12.0 - 16.0 g/dL 16.3(H) 16.4(H)  Hematocrit 35.0 - 47.0 % 48.8(H) 49.3(H)  Platelets 150 - 440 K/uL 178 187.0    No visits with results within 5 Day(s) from this visit. Latest known visit with results is:  Appointment on 07/12/2015    Component Date Value Ref Range Status  . WBC 07/12/2015 5.4  3.6 - 11.0 K/uL Final  . RBC 07/12/2015 5.24* 3.80 - 5.20 MIL/uL Final  . Hemoglobin 07/12/2015 16.3* 12.0 - 16.0 g/dL Final  . HCT 07/12/2015 48.8* 35.0 - 47.0 % Final  . MCV 07/12/2015 93.1  80.0 - 100.0 fL Final  . MCH 07/12/2015 31.1  26.0 - 34.0 pg Final  . MCHC 07/12/2015 33.4  32.0 - 36.0 g/dL Final  . RDW 07/12/2015 14.2  11.5 - 14.5 % Final  . Platelets 07/12/2015 178  150 - 440 K/uL Final  . Neutrophils Relative % 07/12/2015 64   Final  . Neutro Abs 07/12/2015 3.5  1.4 - 6.5 K/uL Final  . Lymphocytes Relative 07/12/2015 24   Final  . Lymphs Abs 07/12/2015 1.3  1.0 - 3.6 K/uL Final  . Monocytes Relative 07/12/2015 10   Final  . Monocytes Absolute 07/12/2015 0.5  0.2 - 0.9 K/uL Final  . Eosinophils Relative 07/12/2015 1   Final  . Eosinophils Absolute 07/12/2015 0.0  0 - 0.7 K/uL Final  . Basophils Relative 07/12/2015 1   Final  . Basophils Absolute 07/12/2015 0.1  0 - 0.1 K/uL Final      STUDIES: US Soft Tissue Head/neck  07/14/2015  CLINICAL DATA:  Palpable nodularity of right lateral superior neck for 1 month. EXAM: ULTRASOUND OF HEAD/NECK SOFT TISSUES TECHNIQUE: Ultrasound examination of the head and neck soft tissues was performed in the area of clinical concern. COMPARISON:  None. FINDINGS: The palpable abnormality corresponds to they small subcutaneous nodule measuring approximately 1.9 x 0.8 x 1.3 cm. This most likely represents a nonenlarged lymph node. No abnormal fluid collection is identified. IMPRESSION: Palpable abnormality in the right neck likely represents a nonenlarged lymph node. If this abnormality enlarges, recommend CT evaluation of the neck with contrast. Electronically Signed   By: Aletta Edouard M.D.   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 The [id number] mutation was not detected, erythropoietin level is within  normal range. Taking into account minimal elevation in the hemoglobin, normal white blood cell differential and platelet count, and a lack of symptoms, I find it necessary to pursue any additional workup. She does not need a follow-up appointment with hematology. She does not need phlebotomies.   Patient expressed understanding and was in agreement with this plan. She also understands that She can call clinic at any time with any questions, concerns, or complaints.    No matching staging information was found for the patient.  Roxana Hires, MD   07/26/2015 11:59 AM

## 2015-07-28 LAB — JAK2  V617F QUAL. WITH REFLEX TO EXON 12

## 2015-07-28 LAB — JAK2 EXON 12 MUTATION ANALYSIS

## 2015-07-28 LAB — ERYTHROPOIETIN: Erythropoietin: 8.6 m[IU]/mL (ref 2.6–18.5)

## 2015-08-18 ENCOUNTER — Telehealth: Payer: Self-pay

## 2015-08-18 NOTE — Telephone Encounter (Signed)
FYI: I called [facility] Dr. Bethanne Ginger office and asked if they could fax over Ms. Jurich's INR's and Current Coumadin Dosage for her records, there was nothing in Care Everywhere. Dr. Bethanne Ginger office will be faxing over records of Ms. Holes care.

## 2015-08-18 NOTE — Telephone Encounter (Signed)
Per telephone note in Epic from 07/14/15 pt no longer wishes to be seen in [facility] practice.  Pt cancelled her 07/21/15 Coumadin follow-up.  Attempted to contact pt to find out who is monitoring and managing her Coumadin at the present so that it can be properly documented in pt's chart, and to make sure pt is not taking Coumadin and not being managed or monitored.  LMOM for pt TCB.  Will forward to Dr Derrel Nip, pt's primary MD office as well to make them aware we are no longer managing or monitoring pt's Coumadin per pt's request.

## 2015-08-18 NOTE — Telephone Encounter (Signed)
I called the pt to find out who is monitoring her coumadin care and she states that Dr. Ubaldo Glassing her cardiologist at [facility] is monitoring her. [phone]

## 2015-08-18 NOTE — Telephone Encounter (Signed)
Since Dr Ubaldo Glassing is pt's new cardiologist and they are monitoring and managing pt's Coumadin I will make note of this and discontinue her anticaogulation track here at [facility].  Thanks for all your help.

## 2015-08-19 ENCOUNTER — Telehealth: Payer: Self-pay | Admitting: Internal Medicine

## 2015-08-19 DIAGNOSIS — Z0000 Encounter for general adult medical examination without abnormal findings: Secondary | ICD-10-CM

## 2015-08-19 DIAGNOSIS — Z1239 Encounter for other screening for malignant neoplasm of breast: Secondary | ICD-10-CM

## 2015-08-19 NOTE — Telephone Encounter (Signed)
Pt called and is requesting a mammogram. No order is in her chart. Please advise once order has been entered. Thanks, MT

## 2015-08-19 NOTE — Telephone Encounter (Signed)
Order in.

## 2015-08-24 ENCOUNTER — Encounter: Payer: Self-pay | Admitting: Internal Medicine

## 2015-08-24 ENCOUNTER — Other Ambulatory Visit: Payer: Self-pay | Admitting: Internal Medicine

## 2015-08-24 ENCOUNTER — Ambulatory Visit
Admission: RE | Admit: 2015-08-24 | Discharge: 2015-08-24 | Disposition: A | Discharge status: Home / Self Care | Payer: Medicare Other | Source: Ambulatory Visit | Attending: Internal Medicine | Admitting: Internal Medicine

## 2015-08-24 DIAGNOSIS — Z1231 Encounter for screening mammogram for malignant neoplasm of breast: Secondary | ICD-10-CM

## 2015-08-24 DIAGNOSIS — Z1239 Encounter for other screening for malignant neoplasm of breast: Secondary | ICD-10-CM

## 2015-09-03 ENCOUNTER — Other Ambulatory Visit: Payer: Self-pay | Admitting: Internal Medicine

## 2015-09-03 DIAGNOSIS — I428 Other cardiomyopathies: Secondary | ICD-10-CM | POA: Insufficient documentation

## 2015-09-03 DIAGNOSIS — I429 Cardiomyopathy, unspecified: Secondary | ICD-10-CM | POA: Insufficient documentation

## 2015-09-21 ENCOUNTER — Other Ambulatory Visit: Payer: Self-pay | Admitting: Internal Medicine

## 2015-09-22 ENCOUNTER — Telehealth: Payer: Self-pay | Admitting: Internal Medicine

## 2015-09-22 NOTE — Telephone Encounter (Signed)
It was filled yesterday, probably got faxed this am to them. Thanks

## 2015-09-22 NOTE — Telephone Encounter (Signed)
Pt called wanting to know why [facility] is not refilling her LORazepam (ATIVAN) 0.5 MG tablet they told her that she needed to contact her pcp.. Please advise pt.Marland Kitchen

## 2015-10-07 ENCOUNTER — Ambulatory Visit (INDEPENDENT_AMBULATORY_CARE_PROVIDER_SITE_OTHER): Payer: Medicare Other | Admitting: Internal Medicine

## 2015-10-07 ENCOUNTER — Encounter: Payer: Self-pay | Admitting: Internal Medicine

## 2015-10-07 VITALS — BP 128/78 | HR 60 | Temp 97.9°F | Resp 12 | Ht 63.0 in | Wt 173.5 lb

## 2015-10-07 DIAGNOSIS — D582 Other hemoglobinopathies: Secondary | ICD-10-CM | POA: Diagnosis not present

## 2015-10-07 DIAGNOSIS — R55 Syncope and collapse: Secondary | ICD-10-CM

## 2015-10-07 DIAGNOSIS — I10 Essential (primary) hypertension: Secondary | ICD-10-CM

## 2015-10-07 DIAGNOSIS — F5105 Insomnia due to other mental disorder: Secondary | ICD-10-CM

## 2015-10-07 DIAGNOSIS — F419 Anxiety disorder, unspecified: Secondary | ICD-10-CM

## 2015-10-07 MED ORDER — ROSUVASTATIN CALCIUM 5 MG PO TABS: 5.0000 mg | ORAL_TABLET | Freq: Every day | ORAL | Status: DC

## 2015-10-07 MED ORDER — LOSARTAN POTASSIUM 50 MG PO TABS: 50.0000 mg | ORAL_TABLET | Freq: Every day | ORAL | Status: DC

## 2015-10-07 NOTE — Patient Instructions (Signed)
Your are doing much better!  The dizziness you are still having when you lie down is called "bening positional vertigo" if it goes away after a minute or two.  Recognizing that this will happen with sudden position changes is KEY to preventing falls.  You can continue to use lorazepam AS NEEDED.  If you start to require it on a DAILY basis,  Return to me to discuss safer ways to manage daily anxiety   For your ear popping,   You can try Benadryl at bedtime,  but you should also consider adding one of these newer second generation antihistamines that are longer acting, non sedating and  available OTC:  Generic  Zyrtec, which is cetirizine.    generic Allegra , available generically as fexofenadine ; comes in 60 mg and 180 mg once daily strengths.    Generic Claritin :  also available as loratidine   DO NOT BUY THE ONES WITH A D" AT THE END  .   KEEP EXercising!

## 2015-10-07 NOTE — Progress Notes (Signed)
Pre-visit discussion using our clinic review tool. No additional management support is needed unless otherwise documented below in the visit note.  

## 2015-10-07 NOTE — Progress Notes (Signed)
Subjective:  Patient ID: Linda [name], female    DOB: 1933/10/07  Age: 82 y.o. MRN: [id number]  CC: The primary encounter diagnosis was Insomnia secondary to anxiety. Diagnoses of Essential hypertension, Elevated hemoglobin ([place]), and Pre-syncope were also pertinent to this visit.  HPI [facility] presents for follow up on chronic conditions including anxiety with insomnia since husband passed 2 years ago. hypertension, hyperlipidemia' GERD and presyncope.   Losartan doe was reduced to 50 mg by Dr Ubaldo Glassing and symptoms of presyncope have resolved., although she continues to have a brief self limited episodes of dizziness when she lies down.   Having anxiety and insomnia,  Has tried lorazepam and it has helped. Has not tried melatonin long term.  Discussed bedtime rituals .    Outpatient Prescriptions Prior to Visit  Medication Sig Dispense Refill  . Cholecalciferol (VITAMIN D) 2000 units tablet Take 2,000 Units by mouth daily.    . Coenzyme Q10 (CO Q-10) 200 MG CAPS Take 1 Dose by mouth 2 (two) times daily.    Marland Kitchen COUMADIN 5 MG tablet Take as directed by coumadin clinic 90 tablet 1  . LORazepam (ATIVAN) 0.5 MG tablet TAKE 1 TABLET BY MOUTH AT BEDTIME, AND MAY TAKE 1/2 TABLET DAILY AS NEEDED FOR INSOMNIA 30 tablet 2  . omeprazole (PRILOSEC) 20 MG capsule Take 20 mg by mouth daily.    Marland Kitchen losartan (COZAAR) 100 MG tablet Take 1 tablet (100 mg total) by mouth daily. (Patient taking differently: Take 50 mg by mouth daily. ) 30 tablet 3  . amLODipine (NORVASC) 5 MG tablet     . rosuvastatin (CRESTOR) 5 MG tablet Take 1 tablet (5 mg total) by mouth daily. 90 tablet 3   No facility-administered medications prior to visit.    Review of Systems;  Patient denies headache, fevers, malaise, unintentional weight loss, skin rash, eye pain, sinus congestion and sinus pain, sore throat, dysphagia,  hemoptysis , cough, dyspnea, wheezing, chest pain, palpitations, orthopnea, edema, abdominal pain, nausea,  melena, diarrhea, constipation, flank pain, dysuria, hematuria, urinary  Frequency, nocturia, numbness, tingling, seizures,  Focal weakness, Loss of consciousness,  Tremor, insomnia, depression, anxiety, and suicidal ideation.      Objective:  BP 128/78 mmHg  Pulse 60  Temp(Src) 97.9 F (36.6 C) (Oral)  Resp 12  Ht 5\' 3"  (1.6 m)  Wt 173 lb 8 oz (78.699 kg)  BMI 30.74 kg/m2  SpO2 97%  BP Readings from Last 3 Encounters:  10/07/15 128/78  07/26/15 119/86  07/12/15 154/86    Wt Readings from Last 3 Encounters:  10/07/15 173 lb 8 oz (78.699 kg)  07/26/15 172 lb 15.2 oz (78.45 kg)  07/12/15 175 lb 7.8 oz (79.6 kg)    General appearance: alert, cooperative and appears stated age Ears: normal TM's and external ear canals both ears Throat: lips, mucosa, and tongue normal; teeth and gums normal Neck: no adenopathy, no carotid bruit, supple, symmetrical, trachea midline and thyroid not enlarged, symmetric, no tenderness/mass/nodules Back: symmetric, no curvature. ROM normal. No CVA tenderness. Lungs: clear to auscultation bilaterally Heart: regular rate and rhythm, S1, S2 normal, no murmur, click, rub or gallop Abdomen: soft, non-tender; bowel sounds normal; no masses,  no organomegaly Pulses: 2+ and symmetric Skin: Skin color, texture, turgor normal. No rashes or lesions Lymph nodes: Cervical, supraclavicular, and axillary nodes normal.  Lab Results  Component Value Date   HGBA1C 6.3 07/02/2015   HGBA1C 6.2 01/13/2014   HGBA1C 6.1 09/07/2009  Lab Results  Component Value Date   CREATININE 0.84 07/02/2015   CREATININE 0.80 01/12/2015   CREATININE 0.73 08/06/2014    Lab Results  Component Value Date   WBC 5.4 07/12/2015   HGB 16.3* 07/12/2015   HCT 48.8* 07/12/2015   PLT 178 07/12/2015   GLUCOSE 124* 07/02/2015   CHOL 163 07/02/2015   TRIG 55.0 07/02/2015   HDL 71.70 07/02/2015   LDLDIRECT 67.7 09/07/2009   LDLCALC 81 07/02/2015   ALT 21 07/02/2015   AST 24  07/02/2015   NA 140 07/02/2015   K 3.8 07/02/2015   CL 102 07/02/2015   CREATININE 0.84 07/02/2015   BUN 16 07/02/2015   CO2 31 07/02/2015   TSH 1.42 01/12/2015   INR 3.2 07/07/2015   HGBA1C 6.3 07/02/2015   MICROALBUR 8.0* 07/02/2015    Mm Screening Breast Tomo Bilateral  08/24/2015  CLINICAL DATA:  Screening. EXAM: DIGITAL SCREENING BILATERAL MAMMOGRAM WITH 3D TOMO WITH CAD COMPARISON:  Previous exam(s). ACR Breast Density Category c: The breast tissue is heterogeneously dense, which may obscure small masses. FINDINGS: There are no findings suspicious for malignancy. Images were processed with CAD. IMPRESSION: No mammographic evidence of malignancy. A result letter of this screening mammogram will be mailed directly to the patient. RECOMMENDATION: Screening mammogram in one year. (Code:SM-B-01Y) BI-RADS CATEGORY  1: Negative. Electronically Signed   By: Franki Cabot M.D.   On: 08/24/2015 12:43    Assessment & Plan:   Problem List Items Addressed This Visit    Essential hypertension    Well controlled on current regimen. Renal function stable, no changes today.  Lab Results  Component Value Date   CREATININE 0.84 07/02/2015   Lab Results  Component Value Date   NA 140 07/02/2015   K 3.8 07/02/2015   CL 102 07/02/2015   CO2 31 07/02/2015         Relevant Medications   losartan (COZAAR) 50 MG tablet   Pre-syncope    Resolved with reduced losartan dose.       Relevant Medications   losartan (COZAAR) 50 MG tablet   Elevated hemoglobin (HCC)    Oncology workup revealed  no signs of PVC       Insomnia secondary to anxiety - Primary    Discussed natural remedies for insomnia including herbal tea and melatonin.  Reviewd principles of good sleep hygiene. Chronic, with no improvement using over-the-counter first generation antihistamines. Reviewed principles of good sleep hygiene. Although she is a snorer, there is no report of apneic spells by husband. Previous use of Ambien  without Ambien CR was ineffective. Trial of lorazepam        A total of 25 minutes of face to face time was spent with patient more than half of which was spent in counselling about the above mentioned conditions  and coordination of care   I have discontinued Ms. Rios's rosuvastatin and amLODipine. I have also changed her losartan. Additionally, I am having her maintain her COUMADIN, Vitamin D, omeprazole, Co Q-10, and LORazepam.  Meds ordered this encounter  Medications  . DISCONTD: rosuvastatin (CRESTOR) 5 MG tablet    Sig: Take 1 tablet (5 mg total) by mouth daily.    Dispense:  30 tablet    Refill:  0  . losartan (COZAAR) 50 MG tablet    Sig: Take 1 tablet (50 mg total) by mouth daily.    Dispense:  90 tablet    Refill:  0    Medications  Discontinued During This Encounter  Medication Reason  . rosuvastatin (CRESTOR) 5 MG tablet Reorder  . amLODipine (NORVASC) 5 MG tablet Change in therapy  . losartan (COZAAR) 100 MG tablet Reorder    Follow-up: Return in about 6 months (around 04/07/2016) for FASTING LABS PRIOR TO NEXT APPT .   Crecencio Mc, MD

## 2015-10-08 MED ORDER — ROSUVASTATIN CALCIUM 5 MG PO TABS: 5.0000 mg | ORAL_TABLET | Freq: Every day | ORAL | Status: DC

## 2015-10-09 DIAGNOSIS — F5105 Insomnia due to other mental disorder: Principal | ICD-10-CM

## 2015-10-09 DIAGNOSIS — F419 Anxiety disorder, unspecified: Secondary | ICD-10-CM | POA: Insufficient documentation

## 2015-10-09 NOTE — Assessment & Plan Note (Signed)
Discussed natural remedies for insomnia including herbal tea and melatonin.  Reviewd principles of good sleep hygiene. Chronic, with no improvement using over-the-counter first generation antihistamines. Reviewed principles of good sleep hygiene. Although she is a snorer, there is no report of apneic spells by husband. Previous use of Ambien without Ambien CR was ineffective. Trial of lorazepam

## 2015-10-09 NOTE — Assessment & Plan Note (Signed)
Oncology workup revealed  no signs of PVC

## 2015-10-09 NOTE — Assessment & Plan Note (Signed)
Resolved with reduced losartan dose.

## 2015-10-09 NOTE — Assessment & Plan Note (Signed)
Well controlled on current regimen. Renal function stable, no changes today.  Lab Results  Component Value Date   CREATININE 0.84 07/02/2015   Lab Results  Component Value Date   NA 140 07/02/2015   K 3.8 07/02/2015   CL 102 07/02/2015   CO2 31 07/02/2015

## 2015-11-17 ENCOUNTER — Encounter: Payer: Self-pay | Admitting: Family Medicine

## 2015-11-17 ENCOUNTER — Ambulatory Visit (INDEPENDENT_AMBULATORY_CARE_PROVIDER_SITE_OTHER): Payer: Medicare Other | Admitting: Family Medicine

## 2015-11-17 VITALS — BP 120/86 | HR 45 | Temp 97.6°F | Ht 63.0 in | Wt 174.5 lb

## 2015-11-17 DIAGNOSIS — N390 Urinary tract infection, site not specified: Secondary | ICD-10-CM | POA: Diagnosis not present

## 2015-11-17 LAB — POCT URINALYSIS DIPSTICK
GLUCOSE UA: 100
KETONES UA: 15
Nitrite, UA: POSITIVE
PH UA: 5
Protein, UA: >=300
SPEC GRAV UA: 1.02
Urobilinogen, UA: 4

## 2015-11-17 MED ORDER — CIPROFLOXACIN HCL 500 MG PO TABS: 500.0000 mg | ORAL_TABLET | Freq: Two times a day (BID) | ORAL | Status: DC

## 2015-11-17 NOTE — Assessment & Plan Note (Signed)
New acute problem. History and urinalysis consistent with UTI. Treating with Cipro. Given the fact that she is on Coumadin, I advised her to have a follow-up INR on Friday or Monday.

## 2015-11-17 NOTE — Patient Instructions (Signed)
Take the Cipro as prescribed.  Follow-up next week for a Coumadin check.  Take care  Dr. Lacinda Axon

## 2015-11-17 NOTE — Progress Notes (Signed)
Pre visit review using our clinic review tool, if applicable. No additional management support is needed unless otherwise documented below in the visit note. 

## 2015-11-17 NOTE — Progress Notes (Signed)
Subjective:  Patient ID: [name], female    DOB: Oct 24, 1933  Age: 82 y.o. MRN: [id number]  CC: ? UTI  HPI:  82 year old female presents with concern of UTI.  Patient states that her symptoms started yesterday. She has been experiencing dysuria, frequency. She's also noticed some blood in her urine. No associated abdominal pain. No flank pain. She has taken Azo with no improvement. No known exacerbating factors. No other complaints at this time.  Social Hx   Social History   Social History  . Marital Status: Married    Spouse Name: [name]  . Number of Children: 2  . Years of Education: 12   Occupational History  . Accounting     Retired   Social History Main Topics  . Smoking status: Never Smoker   . Smokeless tobacco: Never Used  . Alcohol Use: No  . Drug Use: No  . Sexual Activity: Not Asked   Other Topics Concern  . None   Social History Narrative   Jannessa is from [place] and [place]. Recently widowed. She was married to [place], her husband for 59 years. They have a daughter and a son. She enjoys gardening.   Review of Systems  Constitutional: Negative.   Gastrointestinal: Negative for abdominal pain.  Genitourinary: Positive for dysuria, frequency and hematuria. Negative for flank pain.   Objective:  BP 120/86 mmHg  Pulse 45  Temp(Src) 97.6 F (36.4 C) (Oral)  Ht 5\' 3"  (1.6 m)  Wt 174 lb 8 oz (79.153 kg)  BMI 30.92 kg/m2  SpO2 95%  BP/Weight 11/17/2015 [id number]  Systolic BP 123456 0000000 123456  Diastolic BP 86 78 86  Wt. (Lbs) 174.5 173.5 172.95  BMI 30.92 30.74 30.64   Physical Exam  Constitutional: She is oriented to person, place, and time. She appears well-developed. No distress.  Cardiovascular: An irregular rhythm present.  Systolic murmur.  Pulmonary/Chest: Effort normal. She has no wheezes. She has no rales.  Abdominal: Soft. She exhibits no distension. There is no tenderness. There is no rebound and no guarding.    Neurological: She is alert and oriented to person, place, and time.  Psychiatric: She has a normal mood and affect.  Vitals reviewed.  Lab Results  Component Value Date   WBC 5.4 07/12/2015   HGB 16.3* 07/12/2015   HCT 48.8* 07/12/2015   PLT 178 07/12/2015   GLUCOSE 124* 07/02/2015   CHOL 163 07/02/2015   TRIG 55.0 07/02/2015   HDL 71.70 07/02/2015   LDLDIRECT 67.7 09/07/2009   LDLCALC 81 07/02/2015   ALT 21 07/02/2015   AST 24 07/02/2015   NA 140 07/02/2015   K 3.8 07/02/2015   CL 102 07/02/2015   CREATININE 0.84 07/02/2015   BUN 16 07/02/2015   CO2 31 07/02/2015   TSH 1.42 01/12/2015   INR 3.2 07/07/2015   HGBA1C 6.3 07/02/2015   MICROALBUR 8.0* 07/02/2015   Results for orders placed or performed in visit on 11/17/15 (from the past 24 hour(s))  POCT Urinalysis Dipstick     Status: Abnormal   Collection Time: 11/17/15  8:42 AM  Result Value Ref Range   Color, UA brown    Clarity, UA turbid    Glucose, UA 100    Bilirubin, UA large    Ketones, UA 15    Spec Grav, UA 1.020    Blood, UA large    pH, UA 5.0    Protein, UA >=300  Urobilinogen, UA 4.0    Nitrite, UA pos    Leukocytes, UA large (3+) (A) Negative   Assessment & Plan:   Problem List Items Addressed This Visit    UTI (lower urinary tract infection) - Primary    New acute problem. History and urinalysis consistent with UTI. Treating with Cipro. Given the fact that she is on Coumadin, I advised her to have a follow-up INR on Friday or Monday.      Relevant Orders   POCT Urinalysis Dipstick (Completed)   Urine culture      Meds ordered this encounter  Medications  . ciprofloxacin (CIPRO) 500 MG tablet    Sig: Take 1 tablet (500 mg total) by mouth 2 (two) times daily.    Dispense:  14 tablet    Refill:  0   Follow-up: PRN  Tees Toh

## 2015-11-21 LAB — URINE CULTURE

## 2015-11-24 ENCOUNTER — Encounter: Payer: Self-pay | Admitting: Family Medicine

## 2015-11-24 ENCOUNTER — Telehealth: Payer: Self-pay | Admitting: *Deleted

## 2015-11-24 NOTE — Telephone Encounter (Signed)
Patient has requested lab results from 05/31 [phone]

## 2015-11-24 NOTE — Telephone Encounter (Signed)
Patient is aware of lab results.

## 2016-01-17 ENCOUNTER — Telehealth: Payer: Self-pay

## 2016-01-17 DIAGNOSIS — I10 Essential (primary) hypertension: Secondary | ICD-10-CM

## 2016-01-17 DIAGNOSIS — E785 Hyperlipidemia, unspecified: Secondary | ICD-10-CM

## 2016-01-17 NOTE — Telephone Encounter (Signed)
Pt coming for fasting labs 01/18/16. Please place future orders. Thank you.

## 2016-01-18 ENCOUNTER — Other Ambulatory Visit (INDEPENDENT_AMBULATORY_CARE_PROVIDER_SITE_OTHER): Payer: Medicare Other

## 2016-01-18 DIAGNOSIS — I10 Essential (primary) hypertension: Secondary | ICD-10-CM

## 2016-01-18 DIAGNOSIS — E785 Hyperlipidemia, unspecified: Secondary | ICD-10-CM | POA: Diagnosis not present

## 2016-01-18 LAB — CBC WITH DIFFERENTIAL/PLATELET
BASOS ABS: 0.1 10*3/uL (ref 0.0–0.1)
BASOS PCT: 0.9 % (ref 0.0–3.0)
EOS ABS: 0.2 10*3/uL (ref 0.0–0.7)
Eosinophils Relative: 2.5 % (ref 0.0–5.0)
HEMATOCRIT: 46.6 % — AB (ref 36.0–46.0)
HEMOGLOBIN: 15.9 g/dL — AB (ref 12.0–15.0)
LYMPHS PCT: 15.6 % (ref 12.0–46.0)
Lymphs Abs: 1 10*3/uL (ref 0.7–4.0)
MCHC: 34.3 g/dL (ref 30.0–36.0)
MCV: 92.8 fl (ref 78.0–100.0)
MONO ABS: 0.7 10*3/uL (ref 0.1–1.0)
Monocytes Relative: 10.5 % (ref 3.0–12.0)
Neutro Abs: 4.4 10*3/uL (ref 1.4–7.7)
Neutrophils Relative %: 70.5 % (ref 43.0–77.0)
PLATELETS: 199 10*3/uL (ref 150.0–400.0)
RBC: 5.02 Mil/uL (ref 3.87–5.11)
RDW: 13.9 % (ref 11.5–15.5)
WBC: 6.3 10*3/uL (ref 4.0–10.5)

## 2016-01-18 LAB — LIPID PANEL
CHOL/HDL RATIO: 2
Cholesterol: 142 mg/dL (ref 0–200)
HDL: 65.6 mg/dL (ref >39.00)
LDL CALC: 64 mg/dL (ref 0–99)
NONHDL: 76.24
TRIGLYCERIDES: 61 mg/dL (ref 0.0–149.0)
VLDL: 12.2 mg/dL (ref 0.0–40.0)

## 2016-01-18 LAB — COMPREHENSIVE METABOLIC PANEL
ALT: 21 U/L (ref 0–35)
AST: 23 U/L (ref 0–37)
Albumin: 3.9 g/dL (ref 3.5–5.2)
Alkaline Phosphatase: 66 U/L (ref 39–117)
BILIRUBIN TOTAL: 1.3 mg/dL — AB (ref 0.2–1.2)
BUN: 16 mg/dL (ref 6–23)
CHLORIDE: 102 meq/L (ref 96–112)
CO2: 29 meq/L (ref 19–32)
CREATININE: 0.85 mg/dL (ref 0.40–1.20)
Calcium: 9.5 mg/dL (ref 8.4–10.5)
GFR: 68.36 mL/min (ref >60.00)
Glucose, Bld: 137 mg/dL — ABNORMAL HIGH (ref 70–99)
Potassium: 3.7 mEq/L (ref 3.5–5.1)
SODIUM: 140 meq/L (ref 135–145)
Total Protein: 7.1 g/dL (ref 6.0–8.3)

## 2016-01-18 LAB — LDL CHOLESTEROL, DIRECT: Direct LDL: 72 mg/dL

## 2016-01-20 ENCOUNTER — Encounter: Payer: Self-pay | Admitting: Internal Medicine

## 2016-01-20 ENCOUNTER — Ambulatory Visit (INDEPENDENT_AMBULATORY_CARE_PROVIDER_SITE_OTHER): Payer: Medicare Other | Admitting: Internal Medicine

## 2016-01-20 DIAGNOSIS — E119 Type 2 diabetes mellitus without complications: Secondary | ICD-10-CM

## 2016-01-20 DIAGNOSIS — I10 Essential (primary) hypertension: Secondary | ICD-10-CM | POA: Diagnosis not present

## 2016-01-20 DIAGNOSIS — R809 Proteinuria, unspecified: Secondary | ICD-10-CM | POA: Insufficient documentation

## 2016-01-20 DIAGNOSIS — E1121 Type 2 diabetes mellitus with diabetic nephropathy: Secondary | ICD-10-CM

## 2016-01-20 DIAGNOSIS — E785 Hyperlipidemia, unspecified: Secondary | ICD-10-CM | POA: Diagnosis not present

## 2016-01-20 LAB — HEMOGLOBIN A1C: Hgb A1c MFr Bld: 6.3 % (ref 4.6–6.5)

## 2016-01-20 LAB — MICROALBUMIN / CREATININE URINE RATIO
CREATININE, U: 141 mg/dL
MICROALB/CREAT RATIO: 4 mg/g (ref 0.0–30.0)
Microalb, Ur: 5.7 mg/dL — ABNORMAL HIGH (ref 0.0–1.9)

## 2016-01-20 MED ORDER — LOSARTAN POTASSIUM 50 MG PO TABS: 100.0000 mg | ORAL_TABLET | Freq: Every day | ORAL | 2 refills | Status: DC

## 2016-01-20 NOTE — Progress Notes (Signed)
Patient ID: Linda [name], female    DOB: 10/26/1933  Age: 82 y.o. MRN: [id number]   Review of Systems   Patient denies headache, fevers, malaise, unintentional weight loss, skin rash, eye pain, sinus congestion and sinus pain, sore throat, dysphagia,  hemoptysis , cough, dyspnea, wheezing, chest pain, palpitations, orthopnea, edema, abdominal pain, nausea, melena, diarrhea, constipation, flank pain, dysuria, hematuria, urinary  Frequency, nocturia, numbness, tingling, seizures,  Focal weakness, Loss of consciousness,  Tremor, insomnia, depression, anxiety, and suicidal ideation.      Objective:  BP 112/82   Pulse 76   Temp 98.2 F (36.8 C) (Oral)   Ht 5' 1.75" (1.568 m)   Wt 172 lb 8 oz (78.2 kg)   SpO2 95%   BMI 31.81 kg/m   Physical Exam  General appearance: alert, cooperative and appears stated age Head: Normocephalic, without obvious abnormality, atraumatic Eyes: conjunctivae/corneas clear. PERRL, EOM's intact. Fundi benign. Ears: normal TM's and external ear canals both ears Nose: Nares normal. Septum midline. Mucosa normal. No drainage or sinus tenderness. Throat: lips, mucosa, and tongue normal; teeth and gums normal Neck: no adenopathy, no carotid bruit, no JVD, supple, symmetrical, trachea midline and thyroid not enlarged, symmetric, no tenderness/mass/nodules Lungs: clear to auscultation bilaterally Breasts: normal appearance, no masses or tenderness Heart: regular rate and rhythm, S1, S2 normal, no murmur, click, rub or gallop Abdomen: soft, non-tender; bowel sounds normal; no masses,  no organomegaly Extremities: extremities normal, atraumatic, no cyanosis or edema Pulses: 2+ and symmetric Skin: Skin color, texture, turgor normal. No rashes or lesions Neurologic: Alert and oriented X 3, normal strength and tone. Normal symmetric reflexes. Normal coordination and gait.    Assessment & Plan:     I have discontinued Ms. Meraz's ciprofloxacin. I am also having her  maintain her COUMADIN, Vitamin D, omeprazole, Co Q-10, LORazepam, losartan, and rosuvastatin.  No orders of the defined types were placed in this encounter.   Medications Discontinued During This Encounter  Medication Reason  . ciprofloxacin (CIPRO) 500 MG tablet Completed Course    Follow-up: No Follow-up on file.   Crecencio Mc, MD

## 2016-01-20 NOTE — Assessment & Plan Note (Addendum)
Newly diagnosed  diagnosis with fasting glucose of 137, glucosuria noted in early June (POCT done during treatment for UTI .  Her a1c is too low to start treatment given her age and the risk of hypoglycemic events .  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   Lab Results  Component Value Date   HGBA1C 6.3 01/20/2016   Lab Results  Component Value Date   MICROALBUR 5.7 (H) 01/20/2016

## 2016-01-20 NOTE — Patient Instructions (Signed)
Your fasting glucose was 137 yesterday   This is diagnostic of diabetes;  We are checking an A1c today ,  You will not nee medication unless the a1c is > 7.0  I recommend continuiing  low glycemic index diet and increasing your exercise regiemn to 5 days per week .  Return in 3 months and we will discuss the MOST form  .  Don't forget to get your annual dilated retinal exam.   MAMMOGRAM WILL BE ORDERED  Menopause is a normal process in which your reproductive ability comes to an end. This process happens gradually over a span of months to years, usually between the ages of 44 and 52. Menopause is complete when you have missed 12 consecutive menstrual periods. It is important to talk with your health care provider about some of the most common conditions that affect postmenopausal women, such as heart disease, cancer, and bone loss (osteoporosis). Adopting a healthy lifestyle and getting preventive care can help to promote your health and wellness. Those actions can also lower your chances of developing some of these common conditions. WHAT SHOULD I KNOW ABOUT MENOPAUSE? During menopause, you may experience a number of symptoms, such as:  Moderate-to-severe hot flashes.  Night sweats.  Decrease in sex drive.  Mood swings.  Headaches.  Tiredness.  Irritability.  Memory problems.  Insomnia. Choosing to treat or not to treat menopausal changes is an individual decision that you make with your health care provider. WHAT SHOULD I KNOW ABOUT HORMONE REPLACEMENT THERAPY AND SUPPLEMENTS? Hormone therapy products are effective for treating symptoms that are associated with menopause, such as hot flashes and night sweats. Hormone replacement carries certain risks, especially as you become older. If you are thinking about using estrogen or estrogen with progestin treatments, discuss the benefits and risks with your health care provider. WHAT SHOULD I KNOW ABOUT HEART DISEASE AND STROKE? Heart  disease, heart attack, and stroke become more likely as you age. This may be due, in part, to the hormonal changes that your body experiences during menopause. These can affect how your body processes dietary fats, triglycerides, and cholesterol. Heart attack and stroke are both medical emergencies. There are many things that you can do to help prevent heart disease and stroke:  Have your blood pressure checked at least every 1-2 years. High blood pressure causes heart disease and increases the risk of stroke.  If you are 42-34 years old, ask your health care provider if you should take aspirin to prevent a heart attack or a stroke.  Do not use any tobacco products, including cigarettes, chewing tobacco, or electronic cigarettes. If you need help quitting, ask your health care provider.  It is important to eat a healthy diet and maintain a healthy weight.  Be sure to include plenty of vegetables, fruits, low-fat dairy products, and lean protein.  Avoid eating foods that are high in solid fats, added sugars, or salt (sodium).  Get regular exercise. This is one of the most important things that you can do for your health.  Try to exercise for at least 150 minutes each week. The type of exercise that you do should increase your heart rate and make you sweat. This is known as moderate-intensity exercise.  Try to do strengthening exercises at least twice each week. Do these in addition to the moderate-intensity exercise.  Know your numbers.Ask your health care provider to check your cholesterol and your blood glucose. Continue to have your blood tested as directed by your  health care provider. WHAT SHOULD I KNOW ABOUT CANCER SCREENING? There are several types of cancer. Take the following steps to reduce your risk and to catch any cancer development as early as possible. Breast Cancer  Practice breast self-awareness.  This means understanding how your breasts normally appear and feel.  It  also means doing regular breast self-exams. Let your health care provider know about any changes, no matter how small.  If you are 62 or older, have a clinician do a breast exam (clinical breast exam or CBE) every year. Depending on your age, family history, and medical history, it may be recommended that you also have a yearly breast X-ray (mammogram).  If you have a family history of breast cancer, talk with your health care provider about genetic screening.  If you are at high risk for breast cancer, talk with your health care provider about having an MRI and a mammogram every year.  Breast cancer (BRCA) gene test is recommended for women who have family members with BRCA-related cancers. Results of the assessment will determine the need for genetic counseling and BRCA1 and for BRCA2 testing. BRCA-related cancers include these types:  Breast. This occurs in males or females.  Ovarian.  Tubal. This may also be called fallopian tube cancer.  Cancer of the abdominal or pelvic lining (peritoneal cancer).  Prostate.  Pancreatic. Cervical, Uterine, and Ovarian Cancer Your health care provider may recommend that you be screened regularly for cancer of the pelvic organs. These include your ovaries, uterus, and vagina. This screening involves a pelvic exam, which includes checking for microscopic changes to the surface of your cervix (Pap test).  For women ages 21-65, health care providers may recommend a pelvic exam and a Pap test every three years. For women ages 19-65, they may recommend the Pap test and pelvic exam, combined with testing for human papilloma virus (HPV), every five years. Some types of HPV increase your risk of cervical cancer. Testing for HPV may also be done on women of any age who have unclear Pap test results.  Other health care providers may not recommend any screening for nonpregnant women who are considered low risk for pelvic cancer and have no symptoms. Ask your  health care provider if a screening pelvic exam is right for you.  If you have had past treatment for cervical cancer or a condition that could lead to cancer, you need Pap tests and screening for cancer for at least 20 years after your treatment. If Pap tests have been discontinued for you, your risk factors (such as having a new sexual partner) need to be reassessed to determine if you should start having screenings again. Some women have medical problems that increase the chance of getting cervical cancer. In these cases, your health care provider may recommend that you have screening and Pap tests more often.  If you have a family history of uterine cancer or ovarian cancer, talk with your health care provider about genetic screening.  If you have vaginal bleeding after reaching menopause, tell your health care provider.  There are currently no reliable tests available to screen for ovarian cancer. Lung Cancer Lung cancer screening is recommended for adults 18-56 years old who are at high risk for lung cancer because of a history of smoking. A yearly low-dose CT scan of the lungs is recommended if you:  Currently smoke.  Have a history of at least 30 pack-years of smoking and you currently smoke or have quit within the  past 15 years. A pack-year is smoking an average of one pack of cigarettes per day for one year. Yearly screening should:  Continue until it has been 15 years since you quit.  Stop if you develop a health problem that would prevent you from having lung cancer treatment. Colorectal Cancer  This type of cancer can be detected and can often be prevented.  Routine colorectal cancer screening usually begins at age 76 and continues through age 10.  If you have risk factors for colon cancer, your health care provider may recommend that you be screened at an earlier age.  If you have a family history of colorectal cancer, talk with your health care provider about genetic  screening.  Your health care provider may also recommend using home test kits to check for hidden blood in your stool.  A small camera at the end of a tube can be used to examine your colon directly (sigmoidoscopy or colonoscopy). This is done to check for the earliest forms of colorectal cancer.  Direct examination of the colon should be repeated every 5-10 years until age 5. However, if early forms of precancerous polyps or small growths are found or if you have a family history or genetic risk for colorectal cancer, you may need to be screened more often. Skin Cancer  Check your skin from head to toe regularly.  Monitor any moles. Be sure to tell your health care provider:  About any new moles or changes in moles, especially if there is a change in a mole's shape or color.  If you have a mole that is larger than the size of a pencil eraser.  If any of your family members has a history of skin cancer, especially at a young age, talk with your health care provider about genetic screening.  Always use sunscreen. Apply sunscreen liberally and repeatedly throughout the day.  Whenever you are outside, protect yourself by wearing long sleeves, pants, a wide-brimmed hat, and sunglasses. WHAT SHOULD I KNOW ABOUT OSTEOPOROSIS? Osteoporosis is a condition in which bone destruction happens more quickly than new bone creation. After menopause, you may be at an increased risk for osteoporosis. To help prevent osteoporosis or the bone fractures that can happen because of osteoporosis, the following is recommended:  If you are 95-46 years old, get at least 1,000 mg of calcium and at least 600 mg of vitamin D per day.  If you are older than age 16 but younger than age 49, get at least 1,200 mg of calcium and at least 600 mg of vitamin D per day.  If you are older than age 54, get at least 1,200 mg of calcium and at least 800 mg of vitamin D per day. Smoking and excessive alcohol intake increase the  risk of osteoporosis. Eat foods that are rich in calcium and vitamin D, and do weight-bearing exercises several times each week as directed by your health care provider. WHAT SHOULD I KNOW ABOUT HOW MENOPAUSE AFFECTS [place]? Depression may occur at any age, but it is more common as you become older. Common symptoms of depression include:  Low or sad mood.  Changes in sleep patterns.  Changes in appetite or eating patterns.  Feeling an overall lack of motivation or enjoyment of activities that you previously enjoyed.  Frequent crying spells. Talk with your health care provider if you think that you are experiencing depression. WHAT SHOULD I KNOW ABOUT IMMUNIZATIONS? It is important that you get and maintain  your immunizations. These include:  Tetanus, diphtheria, and pertussis (Tdap) booster vaccine.  Influenza every year before the flu season begins.  Pneumonia vaccine.  Shingles vaccine. Your health care provider may also recommend other immunizations.   This information is not intended to replace advice given to you by your health care provider. Make sure you discuss any questions you have with your health care provider.   Document Released: 07/28/2005 Document Revised: 06/26/2014 Document Reviewed: 02/05/2014 Elsevier Interactive Patient Education [employer].

## 2016-01-21 ENCOUNTER — Encounter: Payer: Self-pay | Admitting: Internal Medicine

## 2016-01-23 NOTE — Assessment & Plan Note (Signed)
She has not tolerated statin therapy.  Unclear if she is taking crestor since the refill has not been done recently

## 2016-01-23 NOTE — Assessment & Plan Note (Signed)
Well controlled on current regimen. Renal function stable, no changes today.  Lab Results  Component Value Date   CREATININE 0.85 01/18/2016   Lab Results  Component Value Date   NA 140 01/18/2016   K 3.7 01/18/2016   CL 102 01/18/2016   CO2 29 01/18/2016

## 2016-01-23 NOTE — Progress Notes (Signed)
Subjective:  Patient ID: Linda [name], female    DOB: 1933-12-14  Age: 82 y.o. MRN: [id number]  CC: Diagnoses of Diabetes mellitus without complication ([place]), Hyperlipidemia, and Essential hypertension were pertinent to this visit.  HPI [facility] presents for for follow up on hypertension ,  Hyperlipidemia and  newly diagnosed diabetes  by recent fasting labs with a serum glucose of 137 fasting. .  She has no complaints    Has been in a romantic relationship since May with her pastor who presided over her late husband's funeral; and has been a widower himself  for 8 years. .  She is very happy to have a companion and the relationship has made her more socially active.   Treated for a klebsiella/,E Coli UTI in early June by Dr. Marsa Aris. The UA was positive for glucosuria as well but not discussed with patient..   Occasional vertigo which occurs when with rolling over in bed  > Never lasts more than 15 minutes.   Outpatient Medications Prior to Visit  Medication Sig Dispense Refill  . Cholecalciferol (VITAMIN D) 2000 units tablet Take 2,000 Units by mouth daily.    . Coenzyme Q10 (CO Q-10) 200 MG CAPS Take 1 Dose by mouth 2 (two) times daily.    Marland Kitchen COUMADIN 5 MG tablet Take as directed by coumadin clinic 90 tablet 1  . LORazepam (ATIVAN) 0.5 MG tablet TAKE 1 TABLET BY MOUTH AT BEDTIME, AND MAY TAKE 1/2 TABLET DAILY AS NEEDED FOR INSOMNIA 30 tablet 2  . omeprazole (PRILOSEC) 20 MG capsule Take 20 mg by mouth daily.    . rosuvastatin (CRESTOR) 5 MG tablet Take 1 tablet (5 mg total) by mouth daily. 30 tablet 0  . ciprofloxacin (CIPRO) 500 MG tablet Take 1 tablet (500 mg total) by mouth 2 (two) times daily. 14 tablet 0  . losartan (COZAAR) 50 MG tablet Take 1 tablet (50 mg total) by mouth daily. 90 tablet 0   No facility-administered medications prior to visit.     Review of Systems;  Patient denies headache, fevers, malaise, unintentional weight loss, skin rash, eye pain, sinus  congestion and sinus pain, sore throat, dysphagia,  hemoptysis , cough, dyspnea, wheezing, chest pain, palpitations, orthopnea, edema, abdominal pain, nausea, melena, diarrhea, constipation, flank pain, dysuria, hematuria, , numbness, tingling, seizures,  Focal weakness, Loss of consciousness,  Tremor, insomnia, depression, anxiety, and suicidal ideation.      Objective:  BP 112/82   Pulse 76   Temp 98.2 F (36.8 C) (Oral)   Ht 5' 1.75" (1.568 m)   Wt 172 lb 8 oz (78.2 kg)   SpO2 95%   BMI 31.81 kg/m   BP Readings from Last 3 Encounters:  01/20/16 112/82  11/17/15 120/86  10/07/15 128/78    Wt Readings from Last 3 Encounters:  01/20/16 172 lb 8 oz (78.2 kg)  11/17/15 174 lb 8 oz (79.2 kg)  10/07/15 173 lb 8 oz (78.7 kg)    General appearance: alert, cooperative and appears stated age Ears: normal TM's and external ear canals both ears Throat: lips, mucosa, and tongue normal; teeth and gums normal Neck: no adenopathy, no carotid bruit, supple, symmetrical, trachea midline and thyroid not enlarged, symmetric, no tenderness/mass/nodules Back: symmetric, no curvature. ROM normal. No CVA tenderness. Lungs: clear to auscultation bilaterally Heart: regular rate and rhythm, S1, S2 normal, no murmur, click, rub or gallop Abdomen: soft, non-tender; bowel sounds normal; no masses,  no organomegaly Pulses: 2+ and symmetric  Skin: Skin color, texture, turgor normal. No rashes or lesions Lymph nodes: Cervical, supraclavicular, and axillary nodes normal.  Lab Results  Component Value Date   HGBA1C 6.3 01/20/2016   HGBA1C 6.3 07/02/2015   HGBA1C 6.2 01/13/2014    Lab Results  Component Value Date   CREATININE 0.85 01/18/2016   CREATININE 0.84 07/02/2015   CREATININE 0.80 01/12/2015    Lab Results  Component Value Date   WBC 6.3 01/18/2016   HGB 15.9 (H) 01/18/2016   HCT 46.6 (H) 01/18/2016   PLT 199.0 01/18/2016   GLUCOSE 137 (H) 01/18/2016   CHOL 142 01/18/2016   TRIG  61.0 01/18/2016   HDL 65.60 01/18/2016   LDLDIRECT 72.0 01/18/2016   LDLCALC 64 01/18/2016   ALT 21 01/18/2016   AST 23 01/18/2016   NA 140 01/18/2016   K 3.7 01/18/2016   CL 102 01/18/2016   CREATININE 0.85 01/18/2016   BUN 16 01/18/2016   CO2 29 01/18/2016   TSH 1.42 01/12/2015   INR 3.2 07/07/2015   HGBA1C 6.3 01/20/2016   MICROALBUR 5.7 (H) 01/20/2016    Mm Screening Breast Tomo Bilateral  Result Date: 08/24/2015 CLINICAL DATA:  Screening. EXAM: DIGITAL SCREENING BILATERAL MAMMOGRAM WITH 3D TOMO WITH CAD COMPARISON:  Previous exam(s). ACR Breast Density Category c: The breast tissue is heterogeneously dense, which may obscure small masses. FINDINGS: There are no findings suspicious for malignancy. Images were processed with CAD. IMPRESSION: No mammographic evidence of malignancy. A result letter of this screening mammogram will be mailed directly to the patient. RECOMMENDATION: Screening mammogram in one year. (Code:SM-B-01Y) BI-RADS CATEGORY  1: Negative. Electronically Signed   By: Franki Cabot M.D.   On: 08/24/2015 12:43    Assessment & Plan:   Problem List Items Addressed This Visit    Hyperlipidemia    She has not tolerated statin therapy.  Unclear if she is taking crestor since the refill has not been done recently       Relevant Medications   losartan (COZAAR) 50 MG tablet   Essential hypertension    Well controlled on current regimen. Renal function stable, no changes today.  Lab Results  Component Value Date   CREATININE 0.85 01/18/2016   Lab Results  Component Value Date   NA 140 01/18/2016   K 3.7 01/18/2016   CL 102 01/18/2016   CO2 29 01/18/2016         Relevant Medications   losartan (COZAAR) 50 MG tablet   Diabetes mellitus without complication ([place])    Newly diagnosed  diagnosis with fasting glucose of 137, glucosuria noted in early June (POCT done during treatment for UTI .  Her a1c is too low to start treatment given her age and the risk of  hypoglycemic events .  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   Lab Results  Component Value Date   HGBA1C 6.3 01/20/2016   Lab Results  Component Value Date   MICROALBUR 5.7 (H) 01/20/2016         Relevant Medications   losartan (COZAAR) 50 MG tablet   Other Relevant Orders   Hemoglobin A1c (Completed)   Microalbumin / creatinine urine ratio (Completed)    Other Visit Diagnoses   None.  A total of 25 minutes of face to face time was spent with patient more than half of which was spent in counselling and coordination of care   I have discontinued Ms. Medine's ciprofloxacin. I have also changed her losartan. Additionally, I am having her maintain her COUMADIN, Vitamin D, omeprazole, Co Q-10, LORazepam, and rosuvastatin.  Meds ordered this encounter  Medications  . losartan (COZAAR) 50 MG tablet    Sig: Take 2 tablets (100 mg total) by mouth daily.    Dispense:  180 tablet    Refill:  2    Medications Discontinued During This Encounter  Medication Reason  . ciprofloxacin (CIPRO) 500 MG tablet Completed Course  . losartan (COZAAR) 50 MG tablet Reorder    Follow-up: Return in about 3 months (around 04/21/2016) for follow up diabetes.   Crecencio Mc, MD

## 2016-02-01 ENCOUNTER — Ambulatory Visit (INDEPENDENT_AMBULATORY_CARE_PROVIDER_SITE_OTHER): Payer: Medicare Other

## 2016-02-01 ENCOUNTER — Ambulatory Visit: Payer: Medicare Other

## 2016-02-01 VITALS — BP 118/72 | HR 81 | Temp 98.2°F | Resp 14 | Ht 62.0 in | Wt 171.8 lb

## 2016-02-01 DIAGNOSIS — Z0000 Encounter for general adult medical examination without abnormal findings: Secondary | ICD-10-CM

## 2016-02-01 NOTE — Progress Notes (Signed)
Subjective:   ALEKHYA ZUCCO is a 82 y.o. female who presents for Medicare Annual (Subsequent) preventive examination.  Review of Systems:  No ROS.  Medicare Wellness Visit.  Cardiac Risk Factors include: advanced age (>51men, >84 women);diabetes mellitus;obesity (BMI >30kg/m2)     Objective:     Vitals: BP 118/72 (BP Location: Right Arm, Patient Position: Sitting, Cuff Size: Normal)   Pulse 81   Temp 98.2 F (36.8 C) (Oral)   Resp 14   Ht 5\' 2"  (1.575 m)   Wt 171 lb 12.8 oz (77.9 kg)   SpO2 95%   BMI 31.42 kg/m   Body mass index is 31.42 kg/m.   Tobacco History  Smoking Status  . Never Smoker  Smokeless Tobacco  . Never Used     Counseling given: Not Answered   Past Medical History:  Diagnosis Date  . Anxiety   . Arrhythmia    paroxysmal atrial fibrillation  . Colon polyp   . Diverticulitis   . Hard of hearing   . Hyperlipidemia   . Kidney stone   . Parathyroid disease ([place])    Parathyroidectomy   . PVC (premature ventricular contraction)   . Skin cancer   . UTI (lower urinary tract infection)    Past Surgical History:  Procedure Laterality Date  . CHOLECYSTECTOMY  2002  . LITHOTRIPSY    . PARATHYROIDECTOMY  2004   1 removed  . ROTATOR CUFF REPAIR  2002  . TONSILLECTOMY  1956  . TONSILLECTOMY    . TOTAL ABDOMINAL HYSTERECTOMY W/ BILATERAL SALPINGOOPHORECTOMY  1984  . VEIN LIGATION AND STRIPPING     Family History  Problem Relation Age of Onset  . Heart disease Brother   . Stroke Mother   . Skin cancer Father   . Breast cancer Paternal Grandmother    History  Sexual Activity  . Sexual activity: No    Outpatient Encounter Prescriptions as of 02/01/2016  Medication Sig  . Cholecalciferol (VITAMIN D) 2000 units tablet Take 2,000 Units by mouth daily.  . Coenzyme Q10 (CO Q-10) 200 MG CAPS Take 1 Dose by mouth 2 (two) times daily.  Marland Kitchen COUMADIN 5 MG tablet Take as directed by coumadin clinic  . LORazepam (ATIVAN) 0.5 MG tablet TAKE 1 TABLET  BY MOUTH AT BEDTIME, AND MAY TAKE 1/2 TABLET DAILY AS NEEDED FOR INSOMNIA  . losartan (COZAAR) 50 MG tablet Take 2 tablets (100 mg total) by mouth daily.  Marland Kitchen omeprazole (PRILOSEC) 20 MG capsule Take 20 mg by mouth daily.  . rosuvastatin (CRESTOR) 5 MG tablet Take 1 tablet (5 mg total) by mouth daily.   No facility-administered encounter medications on file as of 02/01/2016.     Activities of Daily Living In your present state of health, do you have any difficulty performing the following activities: 02/01/2016  Hearing? Y  Vision? N  Difficulty concentrating or making decisions? N  Walking or climbing stairs? N  Dressing or bathing? N  Doing errands, shopping? N  Preparing Food and eating ? N  Using the Toilet? N  In the past six months, have you accidently leaked urine? N  Do you have problems with loss of bowel control? N  Managing your Medications? N  Managing your Finances? N  Housekeeping or managing your Housekeeping? N  Some recent data might be hidden    Patient Care Team: Crecencio Mc, MD as PCP - General (Internal Medicine)    Assessment:    This is a routine  wellness examination for Jaelee. The goal of the wellness visit is to assist the patient how to close the gaps in care and create a preventative care plan for the patient.   Taking calcium VIT D as appropriate/Osteoporosis risk reviewed.  Medications reviewed; taking without issues or barriers.  Safety issues reviewed; smoke and carbon monoxide detectors in the home.Firearms locked in a secure area with in the home. Wears seatbelts when driving or riding with others. No violence in the home.  No identified risk were noted; The patient was oriented x 3; appropriate in dress and manner and no objective failures at ADL's or IADL's.   Body mass index; discussed the importance of a healthy diet, water intake and exercise. Educational material provided.  Patient Concerns: None at this time. Follow up with  PCP as needed.  Exercise Activities and Dietary recommendations Current Exercise Habits: Home exercise routine, Type of exercise: calisthenics;treadmill;stretching, Frequency (Times/Week): 3, Intensity: Mild  Goals    . Healthy Lifestyle          STAY HYDRATED AND DRINK PLENTY OF FLUIDS.  STOP INTAKE OF FLUIDS 2 HOURS PRIOR TO BEDTIME TO HELP WITH URINARY FREQUENCY AT NIGHT.   STAY ACTIVE AND MAINTAIN EXERCISE REGIMENT. LOW CARB FOODS.  LEAN MEATS, VEGETABLES.  EDUCATIONAL MATERIAL PROVIDED.      Fall Risk Fall Risk  02/01/2016 01/20/2016 01/19/2015 01/13/2014 03/20/2013  Falls in the past year? No No No No No   Depression Screen PHQ 2/9 Scores 02/01/2016 01/20/2016 01/19/2015 01/13/2014  PHQ - 2 Score 0 0 1 0     Cognitive Testing MMSE - Mini Mental State Exam 02/01/2016  Orientation to time 5  Orientation to Place 5  Registration 3  Attention/ Calculation 5  Recall 3  Language- name 2 objects 2  Language- repeat 1  Language- follow 3 step command 3  Language- read & follow direction 1  Write a sentence 1  Copy design 1  Total score 30    Immunization History  Administered Date(s) Administered  . Influenza-Unspecified 02/17/2013, 03/02/2014, 03/05/2015  . Pneumococcal Conjugate-13 03/04/2014  . Pneumococcal Polysaccharide-23 11/18/2010  . Td 11/17/2009   Screening Tests Health Maintenance  Topic Date Due  . FOOT EXAM  11/09/1943  . INFLUENZA VACCINE  01/18/2016  . ZOSTAVAX  06/19/2018 (Originally 11/10/1995)  . HEMOGLOBIN A1C  07/22/2016  . MAMMOGRAM  08/23/2016  . OPHTHALMOLOGY EXAM  10/18/2016  . TETANUS/TDAP  03/27/2022  . DEXA SCAN  Completed  . PNA vac Low Risk Adult  Completed      Plan:    End of life planning; Advance aging; Advanced directives discussed. Copy of current HCPOA/Living Will on file.  During the course of the visit the patient was educated and counseled about the following appropriate screening and preventive services:   Vaccines to include  Pneumoccal, Influenza, Hepatitis B, Td, Zostavax, HCV  Electrocardiogram  Cardiovascular Disease  Colorectal cancer screening  Bone density screening  Diabetes screening  Glaucoma screening  Mammography/PAP  Nutrition counseling   Patient Instructions (the written plan) was given to the patient.   Varney Biles, LPN  [id number]

## 2016-02-01 NOTE — Patient Instructions (Addendum)
  Ms. Fatzinger , Thank you for taking time to come for your Medicare Wellness Visit. I appreciate your ongoing commitment to your health goals. Please review the following plan we discussed and let me know if I can assist you in the future.   FOLLOW UP WITH DR. Derrel Nip AS NEEDED.  HAPPY CLEO DAY!!!  These are the goals we discussed: Goals    . Healthy Lifestyle          STAY HYDRATED AND DRINK PLENTY OF FLUIDS.  STOP INTAKE OF FLUIDS 2 HOURS PRIOR TO BEDTIME TO HELP WITH URINARY FREQUENCY AT NIGHT.   STAY ACTIVE AND MAINTAIN EXERCISE REGIMENT. LOW CARB FOODS.  LEAN MEATS, VEGETABLES.  EDUCATIONAL MATERIAL PROVIDED.       This is a list of the screening recommended for you and due dates:  Health Maintenance  Topic Date Due  . Complete foot exam   11/09/1943  . Flu Shot  01/18/2016  . Shingles Vaccine  06/19/2018*  . Hemoglobin A1C  07/22/2016  . Mammogram  08/23/2016  . Eye exam for diabetics  10/18/2016  . Tetanus Vaccine  03/27/2022  . DEXA scan (bone density measurement)  Completed  . Pneumonia vaccines  Completed  *Topic was postponed. The date shown is not the original due date.

## 2016-02-07 NOTE — Progress Notes (Signed)
  I have reviewed the above information and agree with above.   Makyi Ledo, MD 

## 2016-02-11 ENCOUNTER — Telehealth: Payer: Self-pay | Admitting: Internal Medicine

## 2016-02-11 DIAGNOSIS — R300 Dysuria: Secondary | ICD-10-CM

## 2016-02-11 MED ORDER — CIPROFLOXACIN HCL 250 MG PO TABS: 250.0000 mg | ORAL_TABLET | Freq: Two times a day (BID) | ORAL | 0 refills | Status: DC

## 2016-02-11 NOTE — Telephone Encounter (Signed)
Please advise 

## 2016-02-11 NOTE — Telephone Encounter (Signed)
Linda [name] called saying she recently discussed with a provider the fact that she has UTI's repeatedly. She woke up through out the night thinking she may have another one. She's also passing blood in her urine. She's going out of town tomorrow and is wondering if a Rx of Cipro can be called into her pharmacy. If she needs to drop off a urine sample please let her know this as well.  Pt's ph# [phone] Thank you.

## 2016-02-11 NOTE — Telephone Encounter (Signed)
I will refill the cipro,  But Since she was just treated in June,  A sample should be obtained so we can culture the urine please ask her to come by today,   Lab orders in place and cipro sent to local pharmacy . Start if AFTER SHE HAS COLLECTED A URINE

## 2016-04-06 ENCOUNTER — Other Ambulatory Visit: Payer: Self-pay | Admitting: Internal Medicine

## 2016-04-27 ENCOUNTER — Ambulatory Visit (INDEPENDENT_AMBULATORY_CARE_PROVIDER_SITE_OTHER): Payer: Medicare Other | Admitting: Internal Medicine

## 2016-04-27 ENCOUNTER — Encounter: Payer: Self-pay | Admitting: Internal Medicine

## 2016-04-27 VITALS — BP 118/74 | HR 66 | Temp 98.2°F | Wt 171.2 lb

## 2016-04-27 DIAGNOSIS — R5383 Other fatigue: Secondary | ICD-10-CM | POA: Diagnosis not present

## 2016-04-27 DIAGNOSIS — K121 Other forms of stomatitis: Secondary | ICD-10-CM

## 2016-04-27 DIAGNOSIS — I10 Essential (primary) hypertension: Secondary | ICD-10-CM

## 2016-04-27 DIAGNOSIS — Z79899 Other long term (current) drug therapy: Secondary | ICD-10-CM | POA: Diagnosis not present

## 2016-04-27 DIAGNOSIS — E1129 Type 2 diabetes mellitus with other diabetic kidney complication: Secondary | ICD-10-CM

## 2016-04-27 DIAGNOSIS — E1169 Type 2 diabetes mellitus with other specified complication: Secondary | ICD-10-CM | POA: Diagnosis not present

## 2016-04-27 DIAGNOSIS — E785 Hyperlipidemia, unspecified: Secondary | ICD-10-CM

## 2016-04-27 DIAGNOSIS — E119 Type 2 diabetes mellitus without complications: Secondary | ICD-10-CM | POA: Diagnosis not present

## 2016-04-27 DIAGNOSIS — R809 Proteinuria, unspecified: Secondary | ICD-10-CM

## 2016-04-27 DIAGNOSIS — I481 Persistent atrial fibrillation: Secondary | ICD-10-CM

## 2016-04-27 DIAGNOSIS — E7800 Pure hypercholesterolemia, unspecified: Secondary | ICD-10-CM

## 2016-04-27 DIAGNOSIS — I4819 Other persistent atrial fibrillation: Secondary | ICD-10-CM

## 2016-04-27 LAB — CBC WITH DIFFERENTIAL/PLATELET
BASOS PCT: 0.7 % (ref 0.0–3.0)
Basophils Absolute: 0 10*3/uL (ref 0.0–0.1)
EOS PCT: 0.7 % (ref 0.0–5.0)
Eosinophils Absolute: 0 10*3/uL (ref 0.0–0.7)
HEMATOCRIT: 47 % — AB (ref 36.0–46.0)
HEMOGLOBIN: 15.8 g/dL — AB (ref 12.0–15.0)
Lymphocytes Relative: 20.6 % (ref 12.0–46.0)
Lymphs Abs: 1.2 10*3/uL (ref 0.7–4.0)
MCHC: 33.5 g/dL (ref 30.0–36.0)
MCV: 94.8 fl (ref 78.0–100.0)
MONOS PCT: 8.1 % (ref 3.0–12.0)
Monocytes Absolute: 0.5 10*3/uL (ref 0.1–1.0)
Neutro Abs: 3.9 10*3/uL (ref 1.4–7.7)
Neutrophils Relative %: 69.9 % (ref 43.0–77.0)
Platelets: 188 10*3/uL (ref 150.0–400.0)
RBC: 4.96 Mil/uL (ref 3.87–5.11)
RDW: 14.6 % (ref 11.5–15.5)
WBC: 5.6 10*3/uL (ref 4.0–10.5)

## 2016-04-27 LAB — MICROALBUMIN / CREATININE URINE RATIO
Creatinine,U: 138.9 mg/dL
Microalb Creat Ratio: 5.9 mg/g (ref 0.0–30.0)
Microalb, Ur: 8.2 mg/dL — ABNORMAL HIGH (ref 0.0–1.9)

## 2016-04-27 LAB — HEMOGLOBIN A1C: HEMOGLOBIN A1C: 6.2 % (ref 4.6–6.5)

## 2016-04-27 LAB — COMPREHENSIVE METABOLIC PANEL
ALT: 19 U/L (ref 0–35)
AST: 22 U/L (ref 0–37)
Albumin: 3.9 g/dL (ref 3.5–5.2)
Alkaline Phosphatase: 57 U/L (ref 39–117)
BUN: 20 mg/dL (ref 6–23)
CHLORIDE: 103 meq/L (ref 96–112)
CO2: 32 mEq/L (ref 19–32)
CREATININE: 0.83 mg/dL (ref 0.40–1.20)
Calcium: 9.5 mg/dL (ref 8.4–10.5)
GFR: 70.22 mL/min (ref >60.00)
GLUCOSE: 101 mg/dL — AB (ref 70–99)
POTASSIUM: 3.7 meq/L (ref 3.5–5.1)
SODIUM: 141 meq/L (ref 135–145)
TOTAL PROTEIN: 6.5 g/dL (ref 6.0–8.3)
Total Bilirubin: 1.1 mg/dL (ref 0.2–1.2)

## 2016-04-27 LAB — LDL CHOLESTEROL, DIRECT: Direct LDL: 91 mg/dL

## 2016-04-27 LAB — TSH: TSH: 1.48 u[IU]/mL (ref 0.35–4.50)

## 2016-04-27 NOTE — Progress Notes (Signed)
Subjective:  Patient ID: Linda [name], female    DOB: 04/12/1934  Age: 82 y.o. MRN: [id number]  CC: The primary encounter diagnosis was Long-term use of high-risk medication. Diagnoses of Oral ulceration, Diabetes mellitus without complication ([place]), Fatigue, unspecified type, Hyperlipidemia due to type 2 diabetes mellitus ([place]), Controlled type 2 diabetes mellitus with microalbuminuria, without long-term current use of insulin ([place]), Essential hypertension, Persistent atrial fibrillation ([place]), and Pure hypercholesterolemia were also pertinent to this visit.  HPI Linda [name] presents for 3 month follow up on diabetes, hypertensin and hyperlipidemia.  Patient has one complaint today (see below) .  Patient is following a low glycemic index diet and taking all prescribed medications regularly without side effects.  Fasting sugars have been under less than 140 most of the time and post prandials have been under 160 except on rare occasions. Patient is exercising about 3 times per week and intentionally trying to lose weight .  Patient has had an eye exam in the last 12 months and checks feet regularly for signs of infection.  Patient does not walk barefoot outside,  And denies an numbness tingling or burning in feet. Patient is up to date on all recommended vaccinations  Ingrown toenail left great toe ,  No redness or signs of infection.  Seeing Linda [name] today   Persistent ulceration of tongue .  No other lesions.  Had a viral uri recently.  No history of herpes.  She is engaged to be married to her pastor who is an old old friend.  They have not been  sexually active.  Discussed potential barriers to  future encounters involving physical intimacy including vaginal stenosis and vaginal dryness.   Lab Results  Component Value Date   HGBA1C 6.2 04/27/2016     Outpatient Medications Prior to Visit  Medication Sig Dispense Refill  . Cholecalciferol (VITAMIN D) 2000 units tablet Take 2,000 Units by  mouth daily.    . Coenzyme Q10 (CO Q-10) 200 MG CAPS Take 1 Dose by mouth 2 (two) times daily.    Marland Kitchen COUMADIN 5 MG tablet Take as directed by coumadin clinic 90 tablet 1  . LORazepam (ATIVAN) 0.5 MG tablet TAKE 1 TABLET BY MOUTH AT BEDTIME AND MAY TAKE 1/2 TAB DAILY AS NEEDED 30 tablet 3  . losartan (COZAAR) 50 MG tablet Take 2 tablets (100 mg total) by mouth daily. 180 tablet 2  . omeprazole (PRILOSEC) 20 MG capsule Take 20 mg by mouth daily.    . rosuvastatin (CRESTOR) 5 MG tablet Take 1 tablet (5 mg total) by mouth daily. 30 tablet 0  . ciprofloxacin (CIPRO) 250 MG tablet Take 1 tablet (250 mg total) by mouth 2 (two) times daily. (Patient not taking: Reported on 04/27/2016) 10 tablet 0   No facility-administered medications prior to visit.     Review of Systems;  Patient denies headache, fevers, malaise, unintentional weight loss, skin rash, eye pain, sinus congestion and sinus pain, sore throat, dysphagia,  hemoptysis , cough, dyspnea, wheezing, chest pain, palpitations, orthopnea, edema, abdominal pain, nausea, melena, diarrhea, constipation, flank pain, dysuria, hematuria, urinary  Frequency, nocturia, numbness, tingling, seizures,  Focal weakness, Loss of consciousness,  Tremor, insomnia, depression, anxiety, and suicidal ideation.      Objective:  BP 118/74 (BP Location: Left Arm, Patient Position: Sitting, Cuff Size: Normal)   Pulse 66   Temp 98.2 F (36.8 C) (Oral)   Wt 171 lb 4 oz (77.7 kg)   SpO2 96%   BMI  31.32 kg/m   BP Readings from Last 3 Encounters:  04/27/16 118/74  02/01/16 118/72  01/20/16 112/82    Wt Readings from Last 3 Encounters:  04/27/16 171 lb 4 oz (77.7 kg)  02/01/16 171 lb 12.8 oz (77.9 kg)  01/20/16 172 lb 8 oz (78.2 kg)    General appearance: alert, cooperative and appears stated age Ears: normal TM's and external ear canals both ears Throat: lips, mucosa, and tongue normal; teeth and gums normal Neck: no adenopathy, no carotid bruit, supple,  symmetrical, trachea midline and thyroid not enlarged, symmetric, no tenderness/mass/nodules Back: symmetric, no curvature. ROM normal. No CVA tenderness. Lungs: clear to auscultation bilaterally Heart: regular rate and rhythm, S1, S2 normal, no murmur, click, rub or gallop Abdomen: soft, non-tender; bowel sounds normal; no masses,  no organomegaly Pulses: 2+ and symmetric Skin: Skin color, texture, turgor normal. No rashes or lesions Lymph nodes: Cervical, supraclavicular, and axillary nodes normal. Foot exam:  Nails are well trimmed,  With great toe ingrown without erythema. No callouses,  Sensation intact to microfilament. No paronychia    Lab Results  Component Value Date   HGBA1C 6.2 04/27/2016   HGBA1C 6.3 01/20/2016   HGBA1C 6.3 07/02/2015    Lab Results  Component Value Date   CREATININE 0.83 04/27/2016   CREATININE 0.85 01/18/2016   CREATININE 0.84 07/02/2015    Lab Results  Component Value Date   WBC 5.6 04/27/2016   HGB 15.8 (H) 04/27/2016   HCT 47.0 (H) 04/27/2016   PLT 188.0 04/27/2016   GLUCOSE 101 (H) 04/27/2016   CHOL 142 01/18/2016   TRIG 61.0 01/18/2016   HDL 65.60 01/18/2016   LDLDIRECT 91.0 04/27/2016   LDLCALC 64 01/18/2016   ALT 19 04/27/2016   AST 22 04/27/2016   NA 141 04/27/2016   K 3.7 04/27/2016   CL 103 04/27/2016   CREATININE 0.83 04/27/2016   BUN 20 04/27/2016   CO2 32 04/27/2016   TSH 1.48 04/27/2016   INR 3.2 07/07/2015   HGBA1C 6.2 04/27/2016   MICROALBUR 8.2 (H) 04/27/2016    Mm Screening Breast Tomo Bilateral  Result Date: 08/24/2015 CLINICAL DATA:  Screening. EXAM: DIGITAL SCREENING BILATERAL MAMMOGRAM WITH 3D TOMO WITH CAD COMPARISON:  Previous exam(s). ACR Breast Density Category c: The breast tissue is heterogeneously dense, which may obscure small masses. FINDINGS: There are no findings suspicious for malignancy. Images were processed with CAD. IMPRESSION: No mammographic evidence of malignancy. A result letter of this  screening mammogram will be mailed directly to the patient. RECOMMENDATION: Screening mammogram in one year. (Code:SM-B-01Y) BI-RADS CATEGORY  1: Negative. Electronically Signed   By: Franki Cabot M.D.   On: 08/24/2015 12:43    Assessment & Plan:   Problem List Items Addressed This Visit    ATRIAL FIBRILLATION    Rate controlled.  On warfarin, managed by cardiology. No changes to regimen today.       Hyperlipidemia    She has not tolerated statin therapy.   However, her LDL is at goal on diet alone.   Lab Results  Component Value Date   CHOL 142 01/18/2016   HDL 65.60 01/18/2016   LDLCALC 64 01/18/2016   LDLDIRECT 91.0 04/27/2016   TRIG 61.0 01/18/2016   CHOLHDL 2 01/18/2016         Essential hypertension    Well controlled on ARB for concurrent microalbuminuria . Renal function stable, no changes today.  Lab Results  Component Value Date   CREATININE 0.83 04/27/2016  Lab Results  Component Value Date   NA 141 04/27/2016   K 3.7 04/27/2016   CL 103 04/27/2016   CO2 32 04/27/2016         Diabetes mellitus type II, controlled ([place])    Well controlled on diet alone.   Her a1c is too low to start treatmet given her age and the risk of hypoglycemic events .  She  has microalbuminuria  is already taking an ARB and a low dose of high potency statin (Crestor 5 mg daily) and cannot tolerate higher doses due to leg pain .  Aspirin is relatively contraindicated since she is already taking coumadin .   Lab Results  Component Value Date   HGBA1C 6.2 04/27/2016   Lab Results  Component Value Date   MICROALBUR 8.2 (H) 04/27/2016         Oral ulceration    Given her current lesion and impending marriage, we discussed screening for herpes simplex.  Screening antibody test was negative.  Will prescribe acyclovir empirically to  Resolve ulcer.       Relevant Orders   HSV(herpes simplex vrs) 1+2 ab-IgG (Completed)    Other Visit Diagnoses    Long-term use of high-risk  medication    -  Primary   Relevant Orders   Comprehensive metabolic panel (Completed)   CBC with Differential/Platelet (Completed)   Fatigue, unspecified type       Relevant Orders   TSH (Completed)   Hyperlipidemia due to type 2 diabetes mellitus ([place])       Relevant Orders   LDL cholesterol, direct (Completed)   LDL cholesterol, direct      I have discontinued Ms. Ciani's rosuvastatin. I am also having her start on acyclovir. Additionally, I am having her maintain her COUMADIN, Vitamin D, omeprazole, Co Q-10, losartan, ciprofloxacin, and LORazepam.  Meds ordered this encounter  Medications  . acyclovir (ZOVIRAX) 400 MG tablet    Sig: Take 1 tablet (400 mg total) by mouth 3 (three) times daily.    Dispense:  21 tablet    Refill:  1    Medications Discontinued During This Encounter  Medication Reason  . rosuvastatin (CRESTOR) 5 MG tablet     Follow-up: Return in about 6 months (around 10/25/2016) for follow up diabetes, fasting labs prior to OV .   Crecencio Mc, MD

## 2016-04-27 NOTE — Patient Instructions (Signed)
Congratulations !    I will see you again in 6 months,  (unless your labs suggest a 3 month follow up for diabetes)  If your labs suggest a herpetic infection causing your ulcer ,  I will call in a medication called acyclovir to take for a week

## 2016-04-27 NOTE — Progress Notes (Signed)
Pre visit review using our clinic review tool, if applicable. No additional management support is needed unless otherwise documented below in the visit note. 

## 2016-04-28 LAB — HSV(HERPES SIMPLEX VRS) I + II AB-IGG: HSV 2 Glycoprotein G Ab, IgG: <0.9 Index (ref <0.90)

## 2016-04-30 ENCOUNTER — Encounter: Payer: Self-pay | Admitting: Internal Medicine

## 2016-04-30 DIAGNOSIS — R809 Proteinuria, unspecified: Secondary | ICD-10-CM

## 2016-04-30 DIAGNOSIS — K121 Other forms of stomatitis: Secondary | ICD-10-CM | POA: Insufficient documentation

## 2016-04-30 DIAGNOSIS — E1129 Type 2 diabetes mellitus with other diabetic kidney complication: Secondary | ICD-10-CM | POA: Insufficient documentation

## 2016-04-30 MED ORDER — ACYCLOVIR 400 MG PO TABS: 400.0000 mg | ORAL_TABLET | Freq: Three times a day (TID) | ORAL | 1 refills | Status: DC

## 2016-04-30 NOTE — Assessment & Plan Note (Signed)
Rate controlled.  On warfarin, managed by cardiology. No changes to regimen today.

## 2016-04-30 NOTE — Assessment & Plan Note (Addendum)
Given her current lesion and impending marriage, we discussed screening for herpes simplex.  Screening antibody test was negative.  Will prescribe acyclovir empirically to  Resolve ulcer.

## 2016-04-30 NOTE — Assessment & Plan Note (Addendum)
Well controlled on diet alone.   Her a1c is too low to start treatmet given her age and the risk of hypoglycemic events .  She  has microalbuminuria  is already taking an ARB and a low dose of high potency statin (Crestor 5 mg daily) and cannot tolerate higher doses due to leg pain .  Aspirin is relatively contraindicated since she is already taking coumadin .   Lab Results  Component Value Date   HGBA1C 6.2 04/27/2016   Lab Results  Component Value Date   MICROALBUR 8.2 (H) 04/27/2016

## 2016-04-30 NOTE — Assessment & Plan Note (Addendum)
She has not tolerated statin therapy.   However, her LDL is at goal on diet alone.   Lab Results  Component Value Date   CHOL 142 01/18/2016   HDL 65.60 01/18/2016   LDLCALC 64 01/18/2016   LDLDIRECT 91.0 04/27/2016   TRIG 61.0 01/18/2016   CHOLHDL 2 01/18/2016

## 2016-04-30 NOTE — Assessment & Plan Note (Addendum)
Well controlled on ARB for concurrent microalbuminuria . Renal function stable, no changes today.  Lab Results  Component Value Date   CREATININE 0.83 04/27/2016   Lab Results  Component Value Date   NA 141 04/27/2016   K 3.7 04/27/2016   CL 103 04/27/2016   CO2 32 04/27/2016

## 2016-05-01 ENCOUNTER — Ambulatory Visit: Payer: Medicare Other | Admitting: Podiatry

## 2016-07-17 ENCOUNTER — Other Ambulatory Visit: Payer: Self-pay | Admitting: Internal Medicine

## 2016-07-17 DIAGNOSIS — Z1231 Encounter for screening mammogram for malignant neoplasm of breast: Secondary | ICD-10-CM

## 2016-08-14 ENCOUNTER — Ambulatory Visit (INDEPENDENT_AMBULATORY_CARE_PROVIDER_SITE_OTHER): Payer: Medicare Other | Admitting: Family

## 2016-08-14 ENCOUNTER — Encounter: Payer: Self-pay | Admitting: Family

## 2016-08-14 VITALS — BP 140/78 | HR 42 | Temp 97.7°F | Resp 17 | Wt 170.4 lb

## 2016-08-14 DIAGNOSIS — I10 Essential (primary) hypertension: Secondary | ICD-10-CM | POA: Diagnosis not present

## 2016-08-14 DIAGNOSIS — F339 Major depressive disorder, recurrent, unspecified: Secondary | ICD-10-CM | POA: Diagnosis not present

## 2016-08-14 MED ORDER — AMLODIPINE BESYLATE 2.5 MG PO TABS: 2.5000 mg | ORAL_TABLET | Freq: Every day | ORAL | 0 refills | Status: DC

## 2016-08-14 MED ORDER — SERTRALINE HCL 50 MG PO TABS: 50.0000 mg | ORAL_TABLET | Freq: Every day | ORAL | 0 refills | Status: DC

## 2016-08-14 NOTE — Progress Notes (Signed)
Pre visit review using our clinic review tool, if applicable. No additional management support is needed unless otherwise documented below in the visit note. 

## 2016-08-14 NOTE — Assessment & Plan Note (Signed)
Elevated today. Reviewed cardiologist last note which patient is supposed to be on losartan and amlodipine. She is currently not taking the amlodipine. We will restart amlodipine at a very small dose, 2.5 mg. Reassured by normal neurologic exam. No signs or symptoms of hypertensive urgency or emergency at this time.

## 2016-08-14 NOTE — Progress Notes (Signed)
Subjective:    Patient ID: Linda [name], female    DOB: April 16, 1934, 83 y.o.   MRN: [id number]  CC: Linda [name] is a 83 y.o. female who presents today for an acute visit.    HPI: CC: elevated BP for past couple of days. Complaint with losartan- takes 50mg  BID.   Weight stable. No increase in salt intake.  At home running  141/95, 145/110, 120/76  Denies exertional chest pain or pressure, numbness or tingling radiating to left arm or jaw, palpitations, dizziness, frequent headaches, changes in vision, or shortness of breath.   H/o afib. Folllows with dr Ubaldo Glassing  Depression- widow 2 years ago; tearful, trouble sleeping. No thoughts of hurting herself or anyone else. Has anxiety during night and takes prn  Ativan.        HISTORY:  Past Medical History:  Diagnosis Date  . Anxiety   . Arrhythmia    paroxysmal atrial fibrillation  . Colon polyp   . Diverticulitis   . Hard of hearing   . Hyperlipidemia   . Kidney stone   . Parathyroid disease ([place])    Parathyroidectomy   . PVC (premature ventricular contraction)   . Skin cancer   . UTI (lower urinary tract infection)    Past Surgical History:  Procedure Laterality Date  . CHOLECYSTECTOMY  2002  . LITHOTRIPSY    . PARATHYROIDECTOMY  2004   1 removed  . ROTATOR CUFF REPAIR  2002  . TONSILLECTOMY  1956  . TONSILLECTOMY    . TOTAL ABDOMINAL HYSTERECTOMY W/ BILATERAL SALPINGOOPHORECTOMY  1984  . VEIN LIGATION AND STRIPPING     Family History  Problem Relation Age of Onset  . Heart disease Brother   . Stroke Mother   . Skin cancer Father   . Breast cancer Paternal Grandmother     Allergies: Crestor [rosuvastatin calcium]; Penicillins; and Sulfonamide derivatives Current Outpatient Prescriptions on File Prior to Visit  Medication Sig Dispense Refill  . Cholecalciferol (VITAMIN D) 2000 units tablet Take 2,000 Units by mouth daily.    . Coenzyme Q10 (CO Q-10) 200 MG CAPS Take 1 Dose by mouth 2 (two) times daily.      Marland Kitchen COUMADIN 5 MG tablet Take as directed by coumadin clinic 90 tablet 1  . LORazepam (ATIVAN) 0.5 MG tablet TAKE 1 TABLET BY MOUTH AT BEDTIME AND MAY TAKE 1/2 TAB DAILY AS NEEDED 30 tablet 3  . losartan (COZAAR) 50 MG tablet Take 2 tablets (100 mg total) by mouth daily. 180 tablet 2  . omeprazole (PRILOSEC) 20 MG capsule Take 20 mg by mouth daily.     No current facility-administered medications on file prior to visit.     Social History  Substance Use Topics  . Smoking status: Never Smoker  . Smokeless tobacco: Never Used  . Alcohol use No    Review of Systems  Constitutional: Negative for chills and fever.  Eyes: Negative for visual disturbance.  Respiratory: Negative for cough.   Cardiovascular: Negative for chest pain and palpitations.  Gastrointestinal: Negative for nausea and vomiting.  Neurological: Negative for headaches.  Psychiatric/Behavioral: Positive for sleep disturbance. Negative for confusion and suicidal ideas. The patient is nervous/anxious.       Objective:    BP 140/78 (BP Location: Left Arm, Patient Position: Sitting, Cuff Size: Normal)   Pulse (!) 42   Temp 97.7 F (36.5 C) (Oral)   Resp 17   Wt 170 lb 6.4 oz (77.3 kg)   SpO2  97%   BMI 31.17 kg/m  BP Readings from Last 3 Encounters:  08/14/16 140/78  04/27/16 118/74  02/01/16 118/72     Physical Exam  Constitutional: She appears well-developed and well-nourished.  HENT:  Mouth/Throat: Uvula is midline, oropharynx is clear and moist and mucous membranes are normal.  Eyes: Conjunctivae and EOM are normal. Pupils are equal, round, and reactive to light.  Fundus normal bilaterally.   Cardiovascular: Normal rate, regular rhythm, normal heart sounds and normal pulses.   Pulmonary/Chest: Effort normal and breath sounds normal. She has no wheezes. She has no rhonchi. She has no rales.  Neurological: She is alert. She has normal strength. No cranial nerve deficit or sensory deficit. She displays a  negative Romberg sign.  Reflex Scores:      Bicep reflexes are 2+ on the right side and 2+ on the left side.      Patellar reflexes are 2+ on the right side and 2+ on the left side. Grip equal and strong bilateral upper extremities. Gait strong and steady. Able to perform rapid alternating movement without difficulty.   Skin: Skin is warm and dry.  Psychiatric: She has a normal mood and affect. Her speech is normal and behavior is normal. Thought content normal.  Vitals reviewed.      Assessment & Plan:   Problem List Items Addressed This Visit      Cardiovascular and Mediastinum   Essential hypertension - Primary    Elevated today. Reviewed cardiologist last note which patient is supposed to be on losartan and amlodipine. She is currently not taking the amlodipine. We will restart amlodipine at a very small dose, 2.5 mg. Reassured by normal neurologic exam. No signs or symptoms of hypertensive urgency or emergency at this time.      Relevant Medications   amLODipine (NORVASC) 2.5 MG tablet     Other   Depression, recurrent ([place])    Will start zoloft due to trouble sleeping. Follow up 6 weeks.       Relevant Medications   sertraline (ZOLOFT) 50 MG tablet         I have discontinued Ms. Jarrett's ciprofloxacin and acyclovir. I am also having her start on sertraline and amLODipine. Additionally, I am having her maintain her COUMADIN, Vitamin D, omeprazole, Co Q-10, losartan, and LORazepam.   Meds ordered this encounter  Medications  . sertraline (ZOLOFT) 50 MG tablet    Sig: Take 1 tablet (50 mg total) by mouth at bedtime.    Dispense:  60 tablet    Refill:  0    Order Specific Question:   Supervising Provider    Answer:   Deborra Medina L [[id number]]  . amLODipine (NORVASC) 2.5 MG tablet    Sig: Take 1 tablet (2.5 mg total) by mouth daily.    Dispense:  60 tablet    Refill:  0    Order Specific Question:   Supervising Provider    Answer:   Crecencio Mc [[id number]]    Return  precautions given.   Risks, benefits, and alternatives of the medications and treatment plan prescribed today were discussed, and patient expressed understanding.   Education regarding symptom management and diagnosis given to patient on AVS.  Continue to follow with TULLO, Aris Everts, MD for routine health maintenance.   Linda [name] and I agreed with plan.   Mable Paris, FNP

## 2016-08-14 NOTE — Patient Instructions (Signed)
Start amlodipine 2.5 at bedtime as Dr Ubaldo Glassing had planned as well  Start zoloft at bedtime  Check BP daily, goal less than 140/90.  Follow up 6 weeks- sooner if blood pressure not controlled

## 2016-08-14 NOTE — Assessment & Plan Note (Signed)
Will start zoloft due to trouble sleeping. Follow up 6 weeks.

## 2016-08-16 ENCOUNTER — Encounter: Payer: Self-pay | Admitting: Family

## 2016-08-24 ENCOUNTER — Ambulatory Visit
Admission: RE | Admit: 2016-08-24 | Discharge: 2016-08-24 | Disposition: A | Discharge status: Home / Self Care | Payer: Medicare Other | Source: Ambulatory Visit | Attending: Internal Medicine | Admitting: Internal Medicine

## 2016-08-24 DIAGNOSIS — Z1231 Encounter for screening mammogram for malignant neoplasm of breast: Secondary | ICD-10-CM | POA: Diagnosis not present

## 2016-09-25 ENCOUNTER — Encounter: Payer: Self-pay | Admitting: Internal Medicine

## 2016-09-25 ENCOUNTER — Ambulatory Visit (INDEPENDENT_AMBULATORY_CARE_PROVIDER_SITE_OTHER): Payer: Medicare Other | Admitting: Internal Medicine

## 2016-09-25 VITALS — BP 116/80 | HR 55 | Temp 97.7°F | Resp 16 | Ht 62.0 in | Wt 169.4 lb

## 2016-09-25 DIAGNOSIS — Z7189 Other specified counseling: Secondary | ICD-10-CM

## 2016-09-25 DIAGNOSIS — E1129 Type 2 diabetes mellitus with other diabetic kidney complication: Secondary | ICD-10-CM | POA: Diagnosis not present

## 2016-09-25 DIAGNOSIS — I482 Chronic atrial fibrillation, unspecified: Secondary | ICD-10-CM

## 2016-09-25 DIAGNOSIS — Z7901 Long term (current) use of anticoagulants: Secondary | ICD-10-CM | POA: Diagnosis not present

## 2016-09-25 DIAGNOSIS — K2100 Gastro-esophageal reflux disease with esophagitis, without bleeding: Secondary | ICD-10-CM

## 2016-09-25 DIAGNOSIS — R42 Dizziness and giddiness: Secondary | ICD-10-CM | POA: Diagnosis not present

## 2016-09-25 DIAGNOSIS — L603 Nail dystrophy: Secondary | ICD-10-CM

## 2016-09-25 DIAGNOSIS — R809 Proteinuria, unspecified: Secondary | ICD-10-CM

## 2016-09-25 LAB — POCT GLYCOSYLATED HEMOGLOBIN (HGB A1C): HEMOGLOBIN A1C: 6.2

## 2016-09-25 NOTE — Patient Instructions (Addendum)
I do think that Hair skin and Nails supplement is a good choice for daily use  I would have your INR checked a week after starting it  I will make the referral back to Dr Rockey Situ  If you are still having reflux,  I would continue omeprazole daily In the morning. You can add a second dose before dinner for persistent nighttime symptoms   You are not addicted to lorazepam if you are not taking it every night!

## 2016-09-25 NOTE — Progress Notes (Signed)
Subjective:  Patient ID: Linda [name], female    DOB: 06-14-1934  Age: 83 y.o. MRN: [id number]  CC: The primary encounter diagnosis was Encounter for anticoagulation discussion and counseling. Diagnoses of Controlled type 2 diabetes mellitus with microalbuminuria, without long-term current use of insulin ([place]), Long term current use of anticoagulants with INR goal of 2.0-3.0, Microalbuminuria due to type 2 diabetes mellitus ([place]), Gastroesophageal reflux disease with esophagitis, Light headedness, Chronic atrial fibrillation ([place]), and Lamellar nail dystrophy were also pertinent to this visit.  HPI [name] presents for   3 month follow up on diabetes.   Patient is following a low glycemic index diet and taking all prescribed medications regularly without side effects.  Fasting sugars have been under less than 140 most of the time and post prandials have been under 160 except on rare occasions. Patient is exercising about 3 times per week and intentionally trying to lose weight .  Patient has had an eye exam in the last 12 months and checks feet regularly for signs of infection.  Patient does not walk barefoot outside,  And denies an numbness tingling or burning in feet. Patient is up to date on all recommended vaccinations . Cc  Recurrent dizzy spells when she lies back.  Feels light headed for a few seconds when she lies supine , also occurs when she bends over and turns head.  No falls,  No vertigo.   Sees [facility] but would like to return to Dr. Rockey Situ,    Has seen [facility] for hoarseness .  Has GERD and sleeps on 2 pilllows ,  Takes omeprazole as needed. .     Outpatient Medications Prior to Visit  Medication Sig Dispense Refill  . amLODipine (NORVASC) 2.5 MG tablet Take 1 tablet (2.5 mg total) by mouth daily. 60 tablet 0  . Cholecalciferol (VITAMIN D) 2000 units tablet Take 2,000 Units by mouth daily.    . Coenzyme Q10 (CO Q-10) 200 MG CAPS Take 1 Dose by mouth 2 (two) times daily.    Marland Kitchen  COUMADIN 5 MG tablet Take as directed by coumadin clinic 90 tablet 1  . LORazepam (ATIVAN) 0.5 MG tablet TAKE 1 TABLET BY MOUTH AT BEDTIME AND MAY TAKE 1/2 TAB DAILY AS NEEDED 30 tablet 3  . losartan (COZAAR) 50 MG tablet Take 2 tablets (100 mg total) by mouth daily. 180 tablet 2  . omeprazole (PRILOSEC) 20 MG capsule Take 20 mg by mouth daily.    . sertraline (ZOLOFT) 50 MG tablet Take 1 tablet (50 mg total) by mouth at bedtime. (Patient not taking: Reported on 09/25/2016) 60 tablet 0   No facility-administered medications prior to visit.     Review of Systems;  Patient denies headache, fevers, malaise, unintentional weight loss, skin rash, eye pain, sinus congestion and sinus pain, sore throat, dysphagia,  hemoptysis , cough, dyspnea, wheezing, chest pain, palpitations, orthopnea, edema, abdominal pain, nausea, melena, diarrhea, constipation, flank pain, dysuria, hematuria, urinary  Frequency, nocturia, numbness, tingling, seizures,  Focal weakness, Loss of consciousness,  Tremor, insomnia, depression, anxiety, and suicidal ideation.      Objective:  BP 116/80   Pulse (!) 55   Temp 97.7 F (36.5 C) (Oral)   Resp 16   Ht 5\' 2"  (1.575 m)   Wt 169 lb 6.4 oz (76.8 kg)   SpO2 98%   BMI 30.98 kg/m   BP Readings from Last 3 Encounters:  09/25/16 116/80  08/14/16 140/78  04/27/16 118/74  Wt Readings from Last 3 Encounters:  09/25/16 169 lb 6.4 oz (76.8 kg)  08/14/16 170 lb 6.4 oz (77.3 kg)  04/27/16 171 lb 4 oz (77.7 kg)    General appearance: alert, cooperative and appears stated age Ears: normal TM's and external ear canals both ears Throat: lips, mucosa, and tongue normal; teeth and gums normal Neck: no adenopathy, no carotid bruit, supple, symmetrical, trachea midline and thyroid not enlarged, symmetric, no tenderness/mass/nodules Back: symmetric, no curvature. ROM normal. No CVA tenderness. Lungs: clear to auscultation bilaterally Heart: regular rate and rhythm, S1, S2  normal, no murmur, click, rub or gallop Abdomen: soft, non-tender; bowel sounds normal; no masses,  no organomegaly Pulses: 2+ and symmetric Skin: Skin color, texture, turgor normal. No rashes or lesions Lymph nodes: Cervical, supraclavicular, and axillary nodes normal.  Lab Results  Component Value Date   HGBA1C 6.2 09/25/2016   HGBA1C 6.2 04/27/2016   HGBA1C 6.3 01/20/2016    Lab Results  Component Value Date   CREATININE 0.83 04/27/2016   CREATININE 0.85 01/18/2016   CREATININE 0.84 07/02/2015    Lab Results  Component Value Date   WBC 5.6 04/27/2016   HGB 15.8 (H) 04/27/2016   HCT 47.0 (H) 04/27/2016   PLT 188.0 04/27/2016   GLUCOSE 101 (H) 04/27/2016   CHOL 142 01/18/2016   TRIG 61.0 01/18/2016   HDL 65.60 01/18/2016   LDLDIRECT 91.0 04/27/2016   LDLCALC 64 01/18/2016   ALT 19 04/27/2016   AST 22 04/27/2016   NA 141 04/27/2016   K 3.7 04/27/2016   CL 103 04/27/2016   CREATININE 0.83 04/27/2016   BUN 20 04/27/2016   CO2 32 04/27/2016   TSH 1.48 04/27/2016   INR 3.2 07/07/2015   HGBA1C 6.2 09/25/2016   MICROALBUR 8.2 (H) 04/27/2016    Mm Screening Breast Tomo Bilateral  Result Date: 08/25/2016 CLINICAL DATA:  Screening. EXAM: 2D DIGITAL SCREENING BILATERAL MAMMOGRAM WITH CAD AND ADJUNCT TOMO COMPARISON:  Previous exam(s). ACR Breast Density Category b: There are scattered areas of fibroglandular density. FINDINGS: There are no findings suspicious for malignancy. Images were processed with CAD. IMPRESSION: No mammographic evidence of malignancy. A result letter of this screening mammogram will be mailed directly to the patient. RECOMMENDATION: Screening mammogram in one year. (Code:SM-B-01Y) BI-RADS CATEGORY  1: Negative. Electronically Signed   By: Curlene Dolphin M.D.   On: 08/25/2016 16:55    Assessment & Plan:   Problem List Items Addressed This Visit    ATRIAL FIBRILLATION    Well controlled. Referring back to dr Rockey Situ per patient request .        Relevant Orders   Ambulatory referral to Cardiology   Diabetes mellitus type II, controlled ([place])    Well controlled on diet alone.    She  has microalbuminuria  And is already taking an ARB and a low dose of high potency statin (Crestor 5 mg daily) and cannot tolerate higher doses due to leg pain .  Aspirin is relatively contraindicated since she is already taking coumadin .   Lab Results  Component Value Date   HGBA1C 6.2 09/25/2016   Lab Results  Component Value Date   MICROALBUR 8.2 (H) 04/27/2016         Relevant Orders   POCT HgB A1C (Completed)   Encounter for anticoagulation discussion and counseling - Primary   GERD (gastroesophageal reflux disease)    Managed with PPI and raised  head of bed.       Lamellar  nail dystrophy    Trial of Hair, skin and Nails, for biotin, pantothenic acid and vitamin d supplementation      Light headedness    Occurring with position change,  No vertigo.  No carotid bruits noted on exam.  Likely mild orthostasis /autonomic dysfunction .  No workup unless symptoms start to last longer.       Microalbuminuria due to type 2 diabetes mellitus ([place])     on ARB  Renal function stable, no changes today.  Lab Results  Component Value Date   CREATININE 0.83 04/27/2016   Lab Results  Component Value Date   NA 141 04/27/2016   K 3.7 04/27/2016   CL 103 04/27/2016   CO2 32 04/27/2016          Other Visit Diagnoses    Long term current use of anticoagulants with INR goal of 2.0-3.0       Relevant Orders   INR/PT      I have discontinued Ms. Stamey's sertraline. I am also having her maintain her COUMADIN, Vitamin D, omeprazole, Co Q-10, losartan, LORazepam, and amLODipine.  No orders of the defined types were placed in this encounter.   Medications Discontinued During This Encounter  Medication Reason  . sertraline (ZOLOFT) 50 MG tablet Patient has not taken in last 30 days    Follow-up: Return in about 6 months (around  03/27/2017).   Crecencio Mc, MD

## 2016-09-25 NOTE — Progress Notes (Signed)
Pre visit review using our clinic review tool, if applicable. No additional management support is needed unless otherwise documented below in the visit note. 

## 2016-09-26 DIAGNOSIS — R42 Dizziness and giddiness: Secondary | ICD-10-CM | POA: Insufficient documentation

## 2016-09-26 DIAGNOSIS — L603 Nail dystrophy: Secondary | ICD-10-CM | POA: Insufficient documentation

## 2016-09-26 NOTE — Assessment & Plan Note (Addendum)
Well controlled on diet alone.    She  has microalbuminuria  And is already taking an ARB and a low dose of high potency statin (Crestor 5 mg daily) and cannot tolerate higher doses due to leg pain .  Aspirin is relatively contraindicated since she is already taking coumadin .   Lab Results  Component Value Date   HGBA1C 6.2 09/25/2016   Lab Results  Component Value Date   MICROALBUR 8.2 (H) 04/27/2016

## 2016-09-26 NOTE — Assessment & Plan Note (Signed)
Occurring with position change,  No vertigo.  No carotid bruits noted on exam.  Likely mild orthostasis /autonomic dysfunction .  No workup unless symptoms start to last longer.

## 2016-09-26 NOTE — Assessment & Plan Note (Signed)
Trial of Hair, skin and Nails, for biotin, pantothenic acid and vitamin d supplementation

## 2016-09-26 NOTE — Assessment & Plan Note (Signed)
on ARB  Renal function stable, no changes today.  Lab Results  Component Value Date   CREATININE 0.83 04/27/2016   Lab Results  Component Value Date   NA 141 04/27/2016   K 3.7 04/27/2016   CL 103 04/27/2016   CO2 32 04/27/2016

## 2016-09-26 NOTE — Assessment & Plan Note (Signed)
Well controlled. Referring back to dr Rockey Situ per patient request .

## 2016-09-26 NOTE — Assessment & Plan Note (Signed)
Managed with PPI and raised  head of bed.

## 2016-09-28 NOTE — Telephone Encounter (Signed)
See note below   Patient wants to re establish with Dr. Rockey Situ per referral from pcp .  Is it ok to schedule ?

## 2016-09-28 NOTE — Telephone Encounter (Signed)
Spoke with patient and she states that she loves where she is at but just hates how long it takes to get in and out when having her coumadin checked because they draw labs instead of pricking her finger. Let her know that Dr. Rockey Situ is not able to accept any new patients at this time but that we do have another physician Dr. Saunders Revel that has some openings. She mentioned that she felt like Dr. Rockey Situ probably did not want her back because of her comments previously and leaving to go to [facility]. Let her know that he was full at this time but that we could set her up to see someone else. She verbalized understanding and stated that she would call back if she would like to make an appointment.

## 2016-10-03 ENCOUNTER — Other Ambulatory Visit (INDEPENDENT_AMBULATORY_CARE_PROVIDER_SITE_OTHER): Payer: Medicare Other

## 2016-10-03 DIAGNOSIS — Z7901 Long term (current) use of anticoagulants: Secondary | ICD-10-CM

## 2016-10-03 LAB — PROTIME-INR
INR: 3.4 ratio — ABNORMAL HIGH (ref 0.8–1.0)
PROTHROMBIN TIME: 37.4 s — AB (ref 9.6–13.1)

## 2016-10-05 ENCOUNTER — Encounter: Payer: Self-pay | Admitting: Internal Medicine

## 2016-10-06 ENCOUNTER — Encounter: Payer: Self-pay | Admitting: Internal Medicine

## 2016-10-21 ENCOUNTER — Other Ambulatory Visit: Payer: Self-pay | Admitting: Internal Medicine

## 2016-10-23 ENCOUNTER — Other Ambulatory Visit (INDEPENDENT_AMBULATORY_CARE_PROVIDER_SITE_OTHER): Payer: Medicare Other

## 2016-10-23 ENCOUNTER — Encounter: Payer: Self-pay | Admitting: Internal Medicine

## 2016-10-23 DIAGNOSIS — E785 Hyperlipidemia, unspecified: Secondary | ICD-10-CM | POA: Diagnosis not present

## 2016-10-23 DIAGNOSIS — E1169 Type 2 diabetes mellitus with other specified complication: Secondary | ICD-10-CM

## 2016-10-23 DIAGNOSIS — E119 Type 2 diabetes mellitus without complications: Secondary | ICD-10-CM

## 2016-10-23 LAB — COMPREHENSIVE METABOLIC PANEL
ALT: 20 U/L (ref 0–35)
AST: 23 U/L (ref 0–37)
Albumin: 4.1 g/dL (ref 3.5–5.2)
Alkaline Phosphatase: 56 U/L (ref 39–117)
BUN: 17 mg/dL (ref 6–23)
CALCIUM: 9.5 mg/dL (ref 8.4–10.5)
CHLORIDE: 103 meq/L (ref 96–112)
CO2: 30 meq/L (ref 19–32)
CREATININE: 0.82 mg/dL (ref 0.40–1.20)
GFR: 71.12 mL/min (ref >60.00)
Glucose, Bld: 130 mg/dL — ABNORMAL HIGH (ref 70–99)
Potassium: 4.1 mEq/L (ref 3.5–5.1)
SODIUM: 139 meq/L (ref 135–145)
Total Bilirubin: 1.5 mg/dL — ABNORMAL HIGH (ref 0.2–1.2)
Total Protein: 6.7 g/dL (ref 6.0–8.3)

## 2016-10-23 LAB — HEMOGLOBIN A1C: Hgb A1c MFr Bld: 6.6 % — ABNORMAL HIGH (ref 4.6–6.5)

## 2016-10-23 LAB — LIPID PANEL
Cholesterol: 181 mg/dL (ref 0–200)
HDL: 77.5 mg/dL (ref >39.00)
LDL Cholesterol: 90 mg/dL (ref 0–99)
NONHDL: 103.97
TRIGLYCERIDES: 71 mg/dL (ref 0.0–149.0)
Total CHOL/HDL Ratio: 2
VLDL: 14.2 mg/dL (ref 0.0–40.0)

## 2016-10-23 LAB — LDL CHOLESTEROL, DIRECT: LDL DIRECT: 90 mg/dL

## 2016-10-26 ENCOUNTER — Encounter: Payer: Self-pay | Admitting: Internal Medicine

## 2016-10-26 ENCOUNTER — Ambulatory Visit (INDEPENDENT_AMBULATORY_CARE_PROVIDER_SITE_OTHER): Payer: Medicare Other | Admitting: Internal Medicine

## 2016-10-26 VITALS — BP 130/88 | HR 57 | Temp 98.1°F | Resp 16 | Ht 62.0 in | Wt 169.4 lb

## 2016-10-26 DIAGNOSIS — E1129 Type 2 diabetes mellitus with other diabetic kidney complication: Secondary | ICD-10-CM

## 2016-10-26 DIAGNOSIS — R42 Dizziness and giddiness: Secondary | ICD-10-CM | POA: Diagnosis not present

## 2016-10-26 DIAGNOSIS — F339 Major depressive disorder, recurrent, unspecified: Secondary | ICD-10-CM

## 2016-10-26 DIAGNOSIS — F419 Anxiety disorder, unspecified: Secondary | ICD-10-CM | POA: Diagnosis not present

## 2016-10-26 DIAGNOSIS — F5105 Insomnia due to other mental disorder: Secondary | ICD-10-CM

## 2016-10-26 DIAGNOSIS — R809 Proteinuria, unspecified: Secondary | ICD-10-CM

## 2016-10-26 NOTE — Progress Notes (Signed)
Subjective:  Patient ID: Linda [name], female    DOB: 10/16/1933  Age: 83 y.o. MRN: [id number]  CC: The primary encounter diagnosis was Light headedness. Diagnoses of Depression, recurrent ([place]), Insomnia secondary to anxiety, and Controlled type 2 diabetes mellitus with microalbuminuria, without long-term current use of insulin ([place]) were also pertinent to this visit.  HPI [facility] presents for 3 month follow up on diabetes.  Patient has no complaints today.  Patient is following a low glycemic index diet and taking all prescribed medications regularly without side effects.  Fasting sugars have been under less than 140 most of the time and post prandials have been under 160 except on rare occasions. Patient is exercising about 3 times per week plus doing yard work and not trying to lose weight .  Patient has had an eye exam in the last 12 months and checks feet regularly for signs of infection.  Patient does not walk barefoot outside,  And denies an numbness tingling or burning in feet. Patient is up to date on all recommended vaccinations  History of statin intolerance  But says she is taking a statin  every other day  Amlodipine  stopped due to low blood pressure .  highest home reading is 135/70  In the last several weeks  Lab Results  Component Value Date   HGBA1C 6.6 (H) 10/23/2016     Outpatient Medications Prior to Visit  Medication Sig Dispense Refill  . Cholecalciferol (VITAMIN D) 2000 units tablet Take 2,000 Units by mouth daily.    . Coenzyme Q10 (CO Q-10) 200 MG CAPS Take 1 Dose by mouth 2 (two) times daily.    Marland Kitchen COUMADIN 5 MG tablet Take as directed by coumadin clinic 90 tablet 1  . LORazepam (ATIVAN) 0.5 MG tablet TAKE 1 TABLET BY MOUTH AT BEDTIME AND MAY TAKE 1/2 TAB DAILY AS NEEDED 30 tablet 3  . losartan (COZAAR) 50 MG tablet TAKE 2 TABLETS (100 MG TOTAL) BY MOUTH DAILY. 180 tablet 2  . omeprazole (PRILOSEC) 20 MG capsule Take 20 mg by mouth daily.    Marland Kitchen amLODipine  (NORVASC) 2.5 MG tablet Take 1 tablet (2.5 mg total) by mouth daily. (Patient not taking: Reported on 10/26/2016) 60 tablet 0   No facility-administered medications prior to visit.     Review of Systems;  Patient denies headache, fevers, malaise, unintentional weight loss, skin rash, eye pain, sinus congestion and sinus pain, sore throat, dysphagia,  hemoptysis , cough, dyspnea, wheezing, chest pain, palpitations, orthopnea, edema, abdominal pain, nausea, melena, diarrhea, constipation, flank pain, dysuria, hematuria, urinary  Frequency, nocturia, numbness, tingling, seizures,  Focal weakness, Loss of consciousness,  Tremor, insomnia, depression, anxiety, and suicidal ideation.      Objective:  BP 130/88 (BP Location: Left Arm, Patient Position: Sitting, Cuff Size: Normal)   Pulse (!) 57   Temp 98.1 F (36.7 C) (Oral)   Resp 16   Ht 5\' 2"  (1.575 m)   Wt 169 lb 6.4 oz (76.8 kg)   SpO2 96%   BMI 30.98 kg/m   BP Readings from Last 3 Encounters:  10/26/16 130/88  09/25/16 116/80  08/14/16 140/78    Wt Readings from Last 3 Encounters:  10/26/16 169 lb 6.4 oz (76.8 kg)  09/25/16 169 lb 6.4 oz (76.8 kg)  08/14/16 170 lb 6.4 oz (77.3 kg)    General appearance: alert, cooperative and appears stated age Ears: normal TM's and external ear canals both ears Throat: lips, mucosa, and  tongue normal; teeth and gums normal Neck: no adenopathy, no carotid bruit, supple, symmetrical, trachea midline and thyroid not enlarged, symmetric, no tenderness/mass/nodules Back: symmetric, no curvature. ROM normal. No CVA tenderness. Lungs: clear to auscultation bilaterally Heart: regular rate and rhythm, S1, S2 normal, no murmur, click, rub or gallop Abdomen: soft, non-tender; bowel sounds normal; no masses,  no organomegaly Pulses: 2+ and symmetric Skin: Skin color, texture, turgor normal. No rashes or lesions Lymph nodes: Cervical, supraclavicular, and axillary nodes normal.  Lab Results    Component Value Date   HGBA1C 6.6 (H) 10/23/2016   HGBA1C 6.2 09/25/2016   HGBA1C 6.2 04/27/2016    Lab Results  Component Value Date   CREATININE 0.82 10/23/2016   CREATININE 0.83 04/27/2016   CREATININE 0.85 01/18/2016    Lab Results  Component Value Date   WBC 5.6 04/27/2016   HGB 15.8 (H) 04/27/2016   HCT 47.0 (H) 04/27/2016   PLT 188.0 04/27/2016   GLUCOSE 130 (H) 10/23/2016   CHOL 181 10/23/2016   TRIG 71.0 10/23/2016   HDL 77.50 10/23/2016   LDLDIRECT 90.0 10/23/2016   LDLCALC 90 10/23/2016   ALT 20 10/23/2016   AST 23 10/23/2016   NA 139 10/23/2016   K 4.1 10/23/2016   CL 103 10/23/2016   CREATININE 0.82 10/23/2016   BUN 17 10/23/2016   CO2 30 10/23/2016   TSH 1.48 04/27/2016   INR 3.4 (H) 10/03/2016   HGBA1C 6.6 (H) 10/23/2016   MICROALBUR 8.2 (H) 04/27/2016    Mm Screening Breast Tomo Bilateral  Result Date: 08/25/2016 CLINICAL DATA:  Screening. EXAM: 2D DIGITAL SCREENING BILATERAL MAMMOGRAM WITH CAD AND ADJUNCT TOMO COMPARISON:  Previous exam(s). ACR Breast Density Category b: There are scattered areas of fibroglandular density. FINDINGS: There are no findings suspicious for malignancy. Images were processed with CAD. IMPRESSION: No mammographic evidence of malignancy. A result letter of this screening mammogram will be mailed directly to the patient. RECOMMENDATION: Screening mammogram in one year. (Code:SM-B-01Y) BI-RADS CATEGORY  1: Negative. Electronically Signed   By: Curlene Dolphin M.D.   On: 08/25/2016 16:55    Assessment & Plan:   Problem List Items Addressed This Visit    Light headedness - Primary   Relevant Orders   US Carotid Duplex Bilateral   Insomnia secondary to anxiety    She has failed a trial of the  natural remedies for insomnia including herbal tea and melatonin.  Reviewed principles of good sleep hygiene. Her insomnia is Chronic, with no improvement using over-the-counter first generation antihistamines. . Previous use of Ambien  without Ambien CR was ineffective. continue lorazepam. The risks and benefits of benzodiazepine use were discussed with patient today including excessive sedation leading to respiratory depression,  impaired thinking/driving, and addiction.  Patient was advised to avoid concurrent use with alcohol, to use medication only as needed and not to share with others  .       Diabetes mellitus type II, controlled ([place])    Well controlled on diet alone.    She  has microalbuminuria  And is already taking an ARB and a low dose of high potency statin (Crestor 5 mg every other day  and cannot tolerate higher doses due to leg pain .  Aspirin is relatively contraindicated since she is already taking coumadin .   Lab Results  Component Value Date   HGBA1C 6.6 (H) 10/23/2016   Lab Results  Component Value Date   MICROALBUR 8.2 (H) 04/27/2016  RESOLVED: Depression, recurrent ([place])    zoloft due to trouble sleeping. Follow up 6 weeks.          I have discontinued Ms. Fitzpatrick's amLODipine. I am also having her maintain her COUMADIN, Vitamin D, omeprazole, Co Q-10, LORazepam, losartan, Multiple Vitamins-Minerals (HAIR SKIN AND NAILS FORMULA PO), and feeding supplement.  Meds ordered this encounter  Medications  . Multiple Vitamins-Minerals (HAIR SKIN AND NAILS FORMULA PO)    Sig: Take by mouth daily.  . feeding supplement (BOOST HIGH PROTEIN) LIQD    Sig: Take 1 Container by mouth daily.    Medications Discontinued During This Encounter  Medication Reason  . amLODipine (NORVASC) 2.5 MG tablet Patient has not taken in last 30 days    Follow-up: Return in about 3 months (around 01/26/2017) for follow up diabetes.   Crecencio Mc, MD

## 2016-10-26 NOTE — Patient Instructions (Addendum)
Your diabetes is still under excellent control in spite of Lemon pie SO YOU ARE ALLOWED TO HAVE IT ONCE A WEEK!   You do not appear to have had any cholesterol medication recently   Linda [name] WHAT YOU THINK YOU ARE TAKING   I have ordered a carotid ultrasound   As long as your home bp is < 140/90 ,  Your blood pressure is fine

## 2016-10-28 NOTE — Assessment & Plan Note (Signed)
Well controlled on diet alone.    She  has microalbuminuria  And is already taking an ARB and a low dose of high potency statin (Crestor 5 mg every other day  and cannot tolerate higher doses due to leg pain .  Aspirin is relatively contraindicated since she is already taking coumadin .   Lab Results  Component Value Date   HGBA1C 6.6 (H) 10/23/2016   Lab Results  Component Value Date   MICROALBUR 8.2 (H) 04/27/2016

## 2016-10-28 NOTE — Assessment & Plan Note (Signed)
She has failed a trial of the  natural remedies for insomnia including herbal tea and melatonin.  Reviewed principles of good sleep hygiene. Her insomnia is Chronic, with no improvement using over-the-counter first generation antihistamines. . Previous use of Ambien without Ambien CR was ineffective. continue lorazepam. The risks and benefits of benzodiazepine use were discussed with patient today including excessive sedation leading to respiratory depression,  impaired thinking/driving, and addiction.  Patient was advised to avoid concurrent use with alcohol, to use medication only as needed and not to share with others  .

## 2016-10-28 NOTE — Assessment & Plan Note (Signed)
zoloft due to trouble sleeping. Follow up 6 weeks.

## 2016-10-31 ENCOUNTER — Encounter: Payer: Self-pay | Admitting: Internal Medicine

## 2016-11-01 ENCOUNTER — Other Ambulatory Visit: Payer: Self-pay | Admitting: Internal Medicine

## 2016-11-01 ENCOUNTER — Other Ambulatory Visit (INDEPENDENT_AMBULATORY_CARE_PROVIDER_SITE_OTHER): Payer: Medicare Other

## 2016-11-01 DIAGNOSIS — R300 Dysuria: Secondary | ICD-10-CM | POA: Diagnosis not present

## 2016-11-01 LAB — URINALYSIS, MICROSCOPIC ONLY

## 2016-11-01 NOTE — Telephone Encounter (Signed)
Patient called states she is feeling bad as in dizzy, no fever, urinary frequency, taking AZO, dysuria.   Just recently had crown due to broke tooth.  Explained to patient waiting on urine culture.  Please advise.

## 2016-11-01 NOTE — Telephone Encounter (Signed)
Pt called and stated that she is really feeling bad. She would like some Cipro called in, she would also like to know when the results would be in. Please advise, thank you!  Call pt @ [phone]

## 2016-11-02 ENCOUNTER — Telehealth: Payer: Self-pay | Admitting: *Deleted

## 2016-11-02 MED ORDER — CIPROFLOXACIN HCL 250 MG PO TABS: 250.0000 mg | ORAL_TABLET | Freq: Two times a day (BID) | ORAL | 0 refills | Status: DC

## 2016-11-02 NOTE — Addendum Note (Signed)
Addended by: Crecencio Mc on: 11/02/2016 10:54 AM   Modules accepted: Orders

## 2016-11-02 NOTE — Telephone Encounter (Signed)
Patient requested lab result Pt contact [phone]

## 2016-11-02 NOTE — Telephone Encounter (Signed)
Spoke with pt and she stated that someone had already called her with results and she had seen them on her mychart. Pt stated that she didn't understand why Dr. Derrel Nip only sent in cipro 250mg  bid and the last time she had a UTI she was given cipro 500 mg bid for seven days. Explained to the pt that it may have been because the infection she had last time was worse than this one. Told pt that if she isn't showing any signs of improvement after day 3 of the medication to give [place] a call back so we can reevaluate. Pt gave a verbal understanding.

## 2016-11-03 ENCOUNTER — Other Ambulatory Visit: Payer: Self-pay

## 2016-11-03 ENCOUNTER — Other Ambulatory Visit: Payer: Self-pay | Admitting: Internal Medicine

## 2016-11-03 LAB — URINE CULTURE

## 2016-11-03 MED ORDER — FOSFOMYCIN TROMETHAMINE 3 G PO PACK: 3.0000 g | PACK | Freq: Once | ORAL | 0 refills | Status: DC

## 2016-11-03 MED ORDER — FOSFOMYCIN TROMETHAMINE 3 G PO PACK: 3.0000 g | PACK | Freq: Once | ORAL | 0 refills | Status: AC

## 2016-11-07 NOTE — Addendum Note (Signed)
Addended by: Leeanne Rio on: 11/07/2016 08:30 AM   Modules accepted: Orders

## 2016-11-17 ENCOUNTER — Telehealth: Payer: Self-pay

## 2016-11-17 NOTE — Telephone Encounter (Signed)
Received an approval letter from her insurance for [employer]. It has been approved from 09/17/2016 through 11/06/2017.  Spoke with pt and she stated that she has contacted [employer] herself since they did approve the medication to see if they will refund her for it.

## 2016-11-23 ENCOUNTER — Encounter: Payer: Self-pay | Admitting: Internal Medicine

## 2017-01-30 ENCOUNTER — Ambulatory Visit: Payer: Medicare Other | Admitting: Internal Medicine

## 2017-01-30 ENCOUNTER — Ambulatory Visit (INDEPENDENT_AMBULATORY_CARE_PROVIDER_SITE_OTHER): Payer: Medicare Other | Admitting: Internal Medicine

## 2017-01-30 ENCOUNTER — Encounter: Payer: Self-pay | Admitting: Internal Medicine

## 2017-01-30 VITALS — BP 120/82 | HR 59 | Temp 98.0°F | Resp 16 | Ht 62.0 in | Wt 172.4 lb

## 2017-01-30 DIAGNOSIS — F5105 Insomnia due to other mental disorder: Secondary | ICD-10-CM

## 2017-01-30 DIAGNOSIS — E7800 Pure hypercholesterolemia, unspecified: Secondary | ICD-10-CM

## 2017-01-30 DIAGNOSIS — F419 Anxiety disorder, unspecified: Secondary | ICD-10-CM | POA: Diagnosis not present

## 2017-01-30 DIAGNOSIS — E1129 Type 2 diabetes mellitus with other diabetic kidney complication: Secondary | ICD-10-CM

## 2017-01-30 DIAGNOSIS — I10 Essential (primary) hypertension: Secondary | ICD-10-CM | POA: Diagnosis not present

## 2017-01-30 DIAGNOSIS — F411 Generalized anxiety disorder: Secondary | ICD-10-CM

## 2017-01-30 DIAGNOSIS — R809 Proteinuria, unspecified: Secondary | ICD-10-CM

## 2017-01-30 MED ORDER — LORAZEPAM 0.5 MG PO TABS: 0.2500 mg | ORAL_TABLET | Freq: Every day | ORAL | 3 refills | Status: DC

## 2017-01-30 MED ORDER — PAROXETINE HCL 10 MG PO TABS: 10.0000 mg | ORAL_TABLET | Freq: Every day | ORAL | 2 refills | Status: DC

## 2017-01-30 NOTE — Patient Instructions (Addendum)
Ms Rutten,  Dennis Bast are doing well!  I am glad  We talked about your anxiety.  I want to work with you get it under control so you can enjoy your relationship again.   Please start the paxil  at 1/2 tablet daily in the evening for the first few days to avoid nausea.  You can increase to a full tablet after a week if you havenot developed side effects of nausea.  If you do not like the way it makes you feel after 2 weeks,  Let me know    you can continue  To  Use lorazepam for insomnia if you feel you need it  despite having increasing the paxil dose to 10 mg daily.   You can use the lorazepam but start with 1/2 tablet   PLEASE BRING YOUR HOME BP MACHINE WITH YOU TO YOUR VISIT WITH DENISA  Tasia Catchings will be due for fasting labs in November , you do not need to see me until February unless you want to come earlier

## 2017-01-30 NOTE — Progress Notes (Signed)
Subjective:  Patient ID: Linda [name], female    DOB: [birth year]  Age: 83 y.o. MRN: [id number]  CC: The primary encounter diagnosis was Pure hypercholesterolemia. Diagnoses of Controlled type 2 diabetes mellitus with microalbuminuria, without long-term current use of insulin ([place]), Microalbuminuria due to type 2 diabetes mellitus ([place]), Insomnia secondary to anxiety, Generalized anxiety disorder, and Essential hypertension were also pertinent to this visit.  HPI [facility] presents for FOLLOW UP ON T2DM. GAD, INSOMNIA DIZZINESS,HYPERTENSION AND ATRIAL FIB   CHECKING BP AT HOME LABILE.    GAD:  GETS ANXIOUS DURING THE DAY WHEN SHE IS AT HOME BY HERSELF. Her pastor boyfriend has returned and is pursuing a relationship again with her.  She does care for him but feels  Anxious when she is not with him. Only sees him once a week due to his responsibilities and the fact that he lives in [place].   USES LORAZEPAM PRN MAKES HER SLEEP WELL, BUT FEELS "CRAZY" THE NEXT MORNING . NOT PANICKY .  Lab Results  Component Value Date   MICROALBUR 8.2 (H) 04/27/2016    Lab Results  Component Value Date   CHOL 181 10/23/2016   HDL 77.50 10/23/2016   LDLCALC 90 10/23/2016   LDLDIRECT 90.0 10/23/2016   TRIG 71.0 10/23/2016   CHOLHDL 2 10/23/2016          Lab Results  Component Value Date   HGBA1C 6.6 (H) 10/23/2016     Outpatient Medications Prior to Visit  Medication Sig Dispense Refill  . Cholecalciferol (VITAMIN D) 2000 units tablet Take 2,000 Units by mouth daily.    . Coenzyme Q10 (CO Q-10) 200 MG CAPS Take 1 Dose by mouth 2 (two) times daily.    Marland Kitchen COUMADIN 5 MG tablet Take as directed by coumadin clinic 90 tablet 1  . feeding supplement (BOOST HIGH PROTEIN) LIQD Take 1 Container by mouth daily.    Marland Kitchen losartan (COZAAR) 50 MG tablet TAKE 2 TABLETS (100 MG TOTAL) BY MOUTH DAILY. 180 tablet 2  . Multiple Vitamins-Minerals (HAIR SKIN AND NAILS FORMULA PO) Take by mouth daily.    Marland Kitchen  LORazepam (ATIVAN) 0.5 MG tablet TAKE 1 TABLET BY MOUTH AT BEDTIME AND MAY TAKE 1/2 TAB DAILY AS NEEDED 30 tablet 3  . omeprazole (PRILOSEC) 20 MG capsule Take 20 mg by mouth daily.     No facility-administered medications prior to visit.     Review of Systems;  Patient denies headache, fevers, malaise, unintentional weight loss, skin rash, eye pain, sinus congestion and sinus pain, sore throat, dysphagia,  hemoptysis , cough, dyspnea, wheezing, chest pain, palpitations, orthopnea, edema, abdominal pain, nausea, melena, diarrhea, constipation, flank pain, dysuria, hematuria, urinary  Frequency, nocturia, numbness, tingling, seizures,  Focal weakness, Loss of consciousness,  Tremor, insomnia, depression, anxiety, and suicidal ideation.      Objective:  BP 120/82 (BP Location: Left Arm, Patient Position: Sitting, Cuff Size: Normal)   Pulse (!) 59   Temp 98 F (36.7 C) (Oral)   Resp 16   Ht 5\' 2"  (1.575 m)   Wt 172 lb 6.4 oz (78.2 kg)   SpO2 97%   BMI 31.53 kg/m   BP Readings from Last 3 Encounters:  01/30/17 120/82  10/26/16 130/88  09/25/16 116/80    Wt Readings from Last 3 Encounters:  01/30/17 172 lb 6.4 oz (78.2 kg)  10/26/16 169 lb 6.4 oz (76.8 kg)  09/25/16 169 lb 6.4 oz (76.8 kg)    General appearance:  alert, cooperative and appears stated age Ears: normal TM's and external ear canals both ears Throat: lips, mucosa, and tongue normal; teeth and gums normal Neck: no adenopathy, no carotid bruit, supple, symmetrical, trachea midline and thyroid not enlarged, symmetric, no tenderness/mass/nodules Back: symmetric, no curvature. ROM normal. No CVA tenderness. Lungs: clear to auscultation bilaterally Heart: regular rate and rhythm, S1, S2 normal, no murmur, click, rub or gallop Abdomen: soft, non-tender; bowel sounds normal; no masses,  no organomegaly Pulses: 2+ and symmetric Skin: Skin color, texture, turgor normal. No rashes or lesions Lymph nodes: Cervical,  supraclavicular, and axillary nodes normal.  Lab Results  Component Value Date   HGBA1C 6.6 (H) 10/23/2016   HGBA1C 6.2 09/25/2016   HGBA1C 6.2 04/27/2016    Lab Results  Component Value Date   CREATININE 0.82 10/23/2016   CREATININE 0.83 04/27/2016   CREATININE 0.85 01/18/2016    Lab Results  Component Value Date   WBC 5.6 04/27/2016   HGB 15.8 (H) 04/27/2016   HCT 47.0 (H) 04/27/2016   PLT 188.0 04/27/2016   GLUCOSE 130 (H) 10/23/2016   CHOL 181 10/23/2016   TRIG 71.0 10/23/2016   HDL 77.50 10/23/2016   LDLDIRECT 90.0 10/23/2016   LDLCALC 90 10/23/2016   ALT 20 10/23/2016   AST 23 10/23/2016   NA 139 10/23/2016   K 4.1 10/23/2016   CL 103 10/23/2016   CREATININE 0.82 10/23/2016   BUN 17 10/23/2016   CO2 30 10/23/2016   TSH 1.48 04/27/2016   INR 3.4 (H) 10/03/2016   HGBA1C 6.6 (H) 10/23/2016   MICROALBUR 8.2 (H) 04/27/2016    Mm Screening Breast Tomo Bilateral  Result Date: 08/25/2016 CLINICAL DATA:  Screening. EXAM: 2D DIGITAL SCREENING BILATERAL MAMMOGRAM WITH CAD AND ADJUNCT TOMO COMPARISON:  Previous exam(s). ACR Breast Density Category b: There are scattered areas of fibroglandular density. FINDINGS: There are no findings suspicious for malignancy. Images were processed with CAD. IMPRESSION: No mammographic evidence of malignancy. A result letter of this screening mammogram will be mailed directly to the patient. RECOMMENDATION: Screening mammogram in one year. (Code:SM-B-01Y) BI-RADS CATEGORY  1: Negative. Electronically Signed   By: Curlene Dolphin M.D.   On: 08/25/2016 16:55    Assessment & Plan:   Problem List Items Addressed This Visit    Microalbuminuria due to type 2 diabetes mellitus ([place])   Relevant Orders   Microalbumin / creatinine urine ratio   Insomnia secondary to anxiety    She has failed a trial of the  natural remedies for insomnia including herbal tea and melatonin.  Reviewed principles of good sleep hygiene. Her insomnia is Chronic, with no  improvement using over-the-counter first generation antihistamines. . Previous use of Ambien without Ambien CR was ineffective. continue lorazepam. The risks and benefits of benzodiazepine use were discussed with patient today including excessive sedation leading to respiratory depression,  impaired thinking/driving, and addiction.  Patient was advised to avoid concurrent use with alcohol, to use medication only as needed and not to share with others  .       Relevant Medications   PARoxetine (PAXIL) 10 MG tablet   LORazepam (ATIVAN) 0.5 MG tablet   Hyperlipidemia - Primary   Relevant Orders   Comprehensive metabolic panel   Lipid panel   Generalized anxiety disorder    Trial of paxil discussed and accepted       Essential hypertension    Home readings have been elevated persistently.  Asked to bring home machine in for calibration,  No medications changes today.      Diabetes mellitus type II, controlled ([place])   Relevant Orders   Hemoglobin A1c      I have discontinued Ms. Bebee's omeprazole. I have also changed her LORazepam. Additionally, I am having her start on PARoxetine. Lastly, I am having her maintain her COUMADIN, Vitamin D, Co Q-10, losartan, Multiple Vitamins-Minerals (HAIR SKIN AND NAILS FORMULA PO), and feeding supplement.  Meds ordered this encounter  Medications  . PARoxetine (PAXIL) 10 MG tablet    Sig: Take 1 tablet (10 mg total) by mouth daily. After dinner    Dispense:  30 tablet    Refill:  2  . LORazepam (ATIVAN) 0.5 MG tablet    Sig: Take 0.5 tablets (0.25 mg total) by mouth at bedtime. If needed for insomnia    Dispense:  30 tablet    Refill:  3    This request is for a new prescription for a controlled substance as required by Federal/State law..   A total of 40 minutes of face to face time was spent with patient more than half of which was spent in counselling and coordination of care   Medications Discontinued During This Encounter  Medication Reason   . omeprazole (PRILOSEC) 20 MG capsule Patient has not taken in last 30 days  . LORazepam (ATIVAN) 0.5 MG tablet Reorder    Follow-up: Return for follow up diabetes.   Crecencio Mc, MD

## 2017-01-31 ENCOUNTER — Ambulatory Visit: Payer: Medicare Other

## 2017-01-31 DIAGNOSIS — F411 Generalized anxiety disorder: Secondary | ICD-10-CM | POA: Insufficient documentation

## 2017-01-31 NOTE — Assessment & Plan Note (Signed)
Trial of paxil discussed and accepted

## 2017-01-31 NOTE — Assessment & Plan Note (Signed)
Home readings have been elevated persistently.  Asked to bring home machine in for calibration,  No medications changes today.

## 2017-01-31 NOTE — Assessment & Plan Note (Signed)
She has failed a trial of the  natural remedies for insomnia including herbal tea and melatonin.  Reviewed principles of good sleep hygiene. Her insomnia is Chronic, with no improvement using over-the-counter first generation antihistamines. . Previous use of Ambien without Ambien CR was ineffective. continue lorazepam. The risks and benefits of benzodiazepine use were discussed with patient today including excessive sedation leading to respiratory depression,  impaired thinking/driving, and addiction.  Patient was advised to avoid concurrent use with alcohol, to use medication only as needed and not to share with others  .

## 2017-02-21 ENCOUNTER — Ambulatory Visit: Payer: Medicare Other | Admitting: Internal Medicine

## 2017-02-26 ENCOUNTER — Ambulatory Visit (INDEPENDENT_AMBULATORY_CARE_PROVIDER_SITE_OTHER): Payer: Medicare Other

## 2017-02-26 VITALS — BP 128/70 | HR 67 | Temp 98.3°F | Resp 14 | Ht 62.0 in | Wt 170.8 lb

## 2017-02-26 DIAGNOSIS — Z0000 Encounter for general adult medical examination without abnormal findings: Secondary | ICD-10-CM | POA: Diagnosis not present

## 2017-02-26 NOTE — Progress Notes (Signed)
Subjective:   Linda [name] is a 83 y.o. female who presents for Medicare Annual (Subsequent) preventive examination.  Review of Systems:  No ROS.  Medicare Wellness Visit. Additional risk factors are reflected in the social history.  Cardiac Risk Factors include: advanced age (>63men, >34 women);hypertension;obesity (BMI >30kg/m2);diabetes mellitus     Objective:     Vitals: BP 128/70 (BP Location: Left Arm, Patient Position: Sitting, Cuff Size: Normal)   Pulse 67   Temp 98.3 F (36.8 C) (Oral)   Resp 14   Ht 5\' 2"  (1.575 m)   Wt 170 lb 12.8 oz (77.5 kg)   SpO2 96%   BMI 31.24 kg/m   Body mass index is 31.24 kg/m.   Tobacco History  Smoking Status  . Never Smoker  Smokeless Tobacco  . Never Used     Counseling given: Not Answered   Past Medical History:  Diagnosis Date  . Anxiety   . Arrhythmia    paroxysmal atrial fibrillation  . Colon polyp   . Diverticulitis   . Hard of hearing   . Hyperlipidemia   . Kidney stone   . Parathyroid disease ([place])    Parathyroidectomy   . PVC (premature ventricular contraction)   . Skin cancer   . UTI (lower urinary tract infection)    Past Surgical History:  Procedure Laterality Date  . CHOLECYSTECTOMY  2002  . LITHOTRIPSY    . PARATHYROIDECTOMY  2004   1 removed  . ROTATOR CUFF REPAIR  2002  . TONSILLECTOMY  1956  . TONSILLECTOMY    . TOTAL ABDOMINAL HYSTERECTOMY W/ BILATERAL SALPINGOOPHORECTOMY  1984  . VEIN LIGATION AND STRIPPING     Family History  Problem Relation Age of Onset  . Heart disease Brother   . Stroke Mother   . Skin cancer Father   . Breast cancer Paternal Grandmother    History  Sexual Activity  . Sexual activity: No    Outpatient Encounter Prescriptions as of 02/26/2017  Medication Sig  . Cholecalciferol (VITAMIN D) 2000 units tablet Take 2,000 Units by mouth daily.  . Coenzyme Q10 (CO Q-10) 200 MG CAPS Take 1 Dose by mouth 2 (two) times daily.  Marland Kitchen COUMADIN 5 MG tablet Take as  directed by coumadin clinic  . feeding supplement (BOOST HIGH PROTEIN) LIQD Take 1 Container by mouth daily.  Marland Kitchen LORazepam (ATIVAN) 0.5 MG tablet Take 0.5 tablets (0.25 mg total) by mouth at bedtime. If needed for insomnia  . losartan (COZAAR) 50 MG tablet TAKE 2 TABLETS (100 MG TOTAL) BY MOUTH DAILY.  . Multiple Vitamins-Minerals (HAIR SKIN AND NAILS FORMULA PO) Take by mouth daily.  Earney Navy Bicarbonate (ZEGERID PO) Take 1 tablet by mouth daily as needed.  Marland Kitchen PARoxetine (PAXIL) 10 MG tablet Take 1 tablet (10 mg total) by mouth daily. After dinner (Patient not taking: Reported on 02/26/2017)   No facility-administered encounter medications on file as of 02/26/2017.     Activities of Daily Living In your present state of health, do you have any difficulty performing the following activities: 02/26/2017  Hearing? Y  Comment Hearing aid  Vision? N  Difficulty concentrating or making decisions? Y  Comment Memory delay with names  Walking or climbing stairs? N  Dressing or bathing? N  Doing errands, shopping? N  Preparing Food and eating ? N  Using the Toilet? N  In the past six months, have you accidently leaked urine? N  Do you have problems with loss of bowel  control? N  Managing your Medications? N  Managing your Finances? N  Housekeeping or managing your Housekeeping? N  Some recent data might be hidden    Patient Care Team: Crecencio Mc, MD as PCP - General (Internal Medicine)    Assessment:    This is a routine wellness examination for [name]. The goal of the wellness visit is to assist the patient how to close the gaps in care and create a preventative care plan for the patient.   The roster of all physicians providing medical care to patient is listed in the Snapshot section of the chart.  Taking calcium VIT D as appropriate/Osteoporosis risk reviewed.    Safety issues reviewed; Smoke and carbon monoxide detectors in the home. No firearms in the home.  Wears  seatbelts when driving or riding with others. Patient does wear sunscreen or protective clothing when in direct sunlight. No violence in the home.  Patient is alert, normal appearance, oriented to person/place/and time.  Correctly identified the president of the [place], recall of 3/3 words, and performing simple calculations. Displays appropriate judgement and can read correct time from watch face.   No new identified risk were noted.  No failures at ADL's or IADL's.  BMI- discussed the importance of a healthy diet, water intake and the benefits of aerobic exercise. Educational material provided.   24 hour diet recall: Breakfast: egg, [place] sausage, apples  Lunch: vegetable plate Dinner: steak, salad, mashed potatoes  Daily fluid intake: 8 cups of water.  Dental- every 6 months.  Eye- Visual acuity not assessed per patient preference since they have regular follow up with the ophthalmologist.  Wears corrective lenses.  Sleep patterns- Sleeps 7-9 hours at night.  Nocturia 2-3 but is able to go back to sleep.  Wakes feeling rested.   Cologuard discussed. Educational material provided for her to check with her insurance for coverage.  Patient Concerns: She requests a referral for upper endoscopy recheck.  See notes 08/31/16.  She has no upper/lower GI complaints except for occasional heartburn.  She is taking Zegerid PRN and states it is working.  She has a fear of throat cancer and would like to rule it out.  No symptoms presenting.    Exercise Activities and Dietary recommendations Current Exercise Habits: Home exercise routine, Type of exercise: calisthenics;treadmill;walking, Time (Minutes): 60, Frequency (Times/Week): 3, Weekly Exercise (Minutes/Week): 180, Intensity: Mild  Goals    . Maintain Healthy Lifestyle          Low carb foods Stay hydrated Exercise      Fall Risk Fall Risk  02/26/2017 02/01/2016 01/20/2016 01/19/2015 01/13/2014  Falls in the past year? No No No No No    Depression Screen PHQ 2/9 Scores 02/26/2017 02/01/2016 01/20/2016 01/19/2015  PHQ - 2 Score 0 0 0 1     Cognitive Function MMSE - Mini Mental State Exam 02/26/2017 02/01/2016  Orientation to time 5 5  Orientation to Place 5 5  Registration 3 3  Attention/ Calculation 5 5  Recall 3 3  Language- name 2 objects 2 2  Language- repeat 1 1  Language- follow 3 step command 3 3  Language- read & follow direction 1 1  Write a sentence 1 1  Copy design 1 1  Total score 30 30        Immunization History  Administered Date(s) Administered  . Influenza-Unspecified 02/17/2013, 03/02/2014, 03/05/2015, 03/31/2016  . Pneumococcal Conjugate-13 03/04/2014  . Pneumococcal Polysaccharide-23 11/18/2010  . Td 11/17/2009  Screening Tests Health Maintenance  Topic Date Due  . INFLUENZA VACCINE  01/17/2017  . HEMOGLOBIN A1C  04/25/2017  . MAMMOGRAM  08/24/2017  . OPHTHALMOLOGY EXAM  10/18/2017  . FOOT EXAM  10/26/2017  . TETANUS/TDAP  03/27/2022  . DEXA SCAN  Completed  . PNA vac Low Risk Adult  Completed      Plan:    End of life planning; Advance aging; Advanced directives discussed. Copy of current HCPOA/Living Will on file.    I have personally reviewed and noted the following in the patient's chart:   . Medical and social history . Use of alcohol, tobacco or illicit drugs  . Current medications and supplements . Functional ability and status . Nutritional status . Physical activity . Advanced directives . List of other physicians . Hospitalizations, surgeries, and ER visits in previous 12 months . Vitals . Screenings to include cognitive, depression, and falls . Referrals and appointments  In addition, I have reviewed and discussed with patient certain preventive protocols, quality metrics, and best practice recommendations. A written personalized care plan for preventive services as well as general preventive health recommendations were provided to patient.      OBrien-Blaney, Darshan Solanki L, LPN  6/44/0347    I have reviewed the above information and agree with above.   Deborra Medina, MD

## 2017-02-26 NOTE — Patient Instructions (Addendum)
  Ms. Lotz , Thank you for taking time to come for your Medicare Wellness Visit. I appreciate your ongoing commitment to your health goals. Please review the following plan we discussed and let me know if I can assist you in the future.   Follow up with Dr. Derrel Nip as needed.    Have a great day!  These are the goals we discussed: Goals    . Maintain Healthy Lifestyle          Low carb foods Stay hydrated Exercise       This is a list of the screening recommended for you and due dates:  Health Maintenance  Topic Date Due  . Flu Shot  01/17/2017  . Hemoglobin A1C  04/25/2017  . Mammogram  08/24/2017  . Eye exam for diabetics  10/18/2017  . Complete foot exam   10/26/2017  . Tetanus Vaccine  03/27/2022  . DEXA scan (bone density measurement)  Completed  . Pneumonia vaccines  Completed

## 2017-04-29 ENCOUNTER — Encounter: Payer: Self-pay | Admitting: Internal Medicine

## 2017-04-29 DIAGNOSIS — R300 Dysuria: Secondary | ICD-10-CM

## 2017-04-30 ENCOUNTER — Other Ambulatory Visit (INDEPENDENT_AMBULATORY_CARE_PROVIDER_SITE_OTHER): Payer: Medicare Other

## 2017-04-30 DIAGNOSIS — E1129 Type 2 diabetes mellitus with other diabetic kidney complication: Secondary | ICD-10-CM | POA: Diagnosis not present

## 2017-04-30 DIAGNOSIS — E7800 Pure hypercholesterolemia, unspecified: Secondary | ICD-10-CM | POA: Diagnosis not present

## 2017-04-30 DIAGNOSIS — R809 Proteinuria, unspecified: Secondary | ICD-10-CM | POA: Diagnosis not present

## 2017-04-30 DIAGNOSIS — R300 Dysuria: Secondary | ICD-10-CM | POA: Diagnosis not present

## 2017-04-30 LAB — COMPREHENSIVE METABOLIC PANEL
ALK PHOS: 55 U/L (ref 39–117)
ALT: 18 U/L (ref 0–35)
AST: 22 U/L (ref 0–37)
Albumin: 4 g/dL (ref 3.5–5.2)
BILIRUBIN TOTAL: 1.2 mg/dL (ref 0.2–1.2)
BUN: 20 mg/dL (ref 6–23)
CO2: 30 meq/L (ref 19–32)
CREATININE: 0.81 mg/dL (ref 0.40–1.20)
Calcium: 9.5 mg/dL (ref 8.4–10.5)
Chloride: 101 mEq/L (ref 96–112)
GFR: 72.04 mL/min (ref >60.00)
GLUCOSE: 132 mg/dL — AB (ref 70–99)
Potassium: 3.6 mEq/L (ref 3.5–5.1)
Sodium: 139 mEq/L (ref 135–145)
TOTAL PROTEIN: 6.8 g/dL (ref 6.0–8.3)

## 2017-04-30 LAB — URINALYSIS, MICROSCOPIC ONLY: RBC / HPF: NONE SEEN (ref 0–?)

## 2017-04-30 LAB — POCT URINALYSIS DIPSTICK
GLUCOSE UA: 100
LEUKOCYTES UA: NEGATIVE
NITRITE UA: POSITIVE
PH UA: 5 (ref 5.0–8.0)
Protein, UA: 100
Spec Grav, UA: 1.015 (ref 1.010–1.025)
Urobilinogen, UA: 1 E.U./dL

## 2017-04-30 LAB — LIPID PANEL
CHOL/HDL RATIO: 3
Cholesterol: 186 mg/dL (ref 0–200)
HDL: 67.7 mg/dL (ref >39.00)
LDL CALC: 107 mg/dL — AB (ref 0–99)
NonHDL: 118.12
Triglycerides: 56 mg/dL (ref 0.0–149.0)
VLDL: 11.2 mg/dL (ref 0.0–40.0)

## 2017-04-30 LAB — HEMOGLOBIN A1C: Hgb A1c MFr Bld: 6.4 % (ref 4.6–6.5)

## 2017-04-30 LAB — MICROALBUMIN / CREATININE URINE RATIO
CREATININE, U: 158.4 mg/dL
Microalb Creat Ratio: 7.6 mg/g (ref 0.0–30.0)
Microalb, Ur: 12.1 mg/dL — ABNORMAL HIGH (ref 0.0–1.9)

## 2017-04-30 NOTE — Telephone Encounter (Signed)
Since patient coming in for labs this morning I put in the urine orders, due to patient having burning , blood in urine, with frequency no appointment available.

## 2017-05-01 LAB — URINE CULTURE
MICRO NUMBER: 81271544
Result:: NO GROWTH
SPECIMEN QUALITY:: ADEQUATE

## 2017-05-02 ENCOUNTER — Other Ambulatory Visit: Payer: Medicare Other

## 2017-05-03 ENCOUNTER — Telehealth: Payer: Self-pay | Admitting: Internal Medicine

## 2017-05-03 ENCOUNTER — Ambulatory Visit: Payer: Medicare Other | Admitting: Internal Medicine

## 2017-05-03 NOTE — Telephone Encounter (Signed)
Let patient know that labs have not been resulted.

## 2017-05-03 NOTE — Telephone Encounter (Signed)
Copied from [place] [id number]. Topic: Quick Communication - Lab Results >> May 03, 2017  2:36 PM Burnis Medin, NT wrote: Pt. Called about getting lab results back from her urine culture and a fasting lab. Pt. Would like a call back.

## 2017-05-06 ENCOUNTER — Encounter: Payer: Self-pay | Admitting: Internal Medicine

## 2017-05-16 ENCOUNTER — Ambulatory Visit: Payer: Self-pay | Admitting: *Deleted

## 2017-05-16 ENCOUNTER — Telehealth: Payer: Self-pay | Admitting: *Deleted

## 2017-05-16 NOTE — Telephone Encounter (Signed)
Patient has been scheduled for 05/17/17 at 8.

## 2017-05-16 NOTE — Telephone Encounter (Signed)
Attempted to reach patient to have her bring in urine this afternoon. Left message on home phone and called cell phone but no v/m to leave message

## 2017-05-16 NOTE — Telephone Encounter (Signed)
Tried to reach patient to get more information any flank pain/ fever? Chills? Dysuria? Frequency? How much blood in urine pink red ? [facility] may inquire.

## 2017-05-16 NOTE — Telephone Encounter (Signed)
During  The  Triage  Encounter    Just  After  The  Plan of  Care  Was  Discussed  With  Her  And  She  Was  Advised   That  The  Office  Would  Be  Getting back  With  Her the patient    Took  A  Call   And  We  Lost  Contact   Attempted  To call pt  Back  On her home  Phone  And  Received  No  Answer  Voice  Mail left     Attempted  Her  Mobile  Phone  Number no  Answer   And  Voice  Mail not  Set  Up

## 2017-05-16 NOTE — Telephone Encounter (Signed)
Copied from [place]. Topic: General - Other >> May 16, 2017  8:48 AM Linda [name] wrote: Reason for CRM: patient would like to get into see Dr Derrel Nip this week sometime she is still having blood in her urine I can't find any openings

## 2017-05-16 NOTE — Telephone Encounter (Signed)
  Reason for Disposition . Bad or foul-smelling urine  Answer Assessment - Initial Assessment Questions 1. SYMPTOM: "What's the main symptom you're concerned about?" (e.g., frequency, incontinence)    Wants     To  Determine   If  This  An  Infection   Or if  It is  A  Kidney  Stone   2. ONSET: "When did the  ________  start?"       16   Days ago    3. PAIN: "Is there any pain?" If so, ask: "How bad is it?" (Scale: 1-10; mild, moderate, severe)     3 4. CAUSE: "What do you think is causing the symptoms?"     Maybe  A   Kidney  Stone    5. OTHER SYMPTOMS: "Do you have any other symptoms?" (e.g., fever, flank pain, blood in urine, pain with urination)    Frequency  Of  Urination   Taking  Azo   So  Not  Sure   If any  Blood  In  Urine    6. PREGNANCY: "Is there any chance you are pregnant?" "When was your last menstrual period?"     menapause  Protocols used: URINARY [facility]

## 2017-05-16 NOTE — Telephone Encounter (Signed)
  Reason for Disposition . Urinating more frequently than usual (i.e., frequency)  Additional Information . Commented on: Bad or foul-smelling urine    Clicked  In  Error   No   Foul  Smelling  Urine    She  Has  Urinary  Frequency  Protocols used: URINARY SYMPTOMS-A-AH   Pt  Reports   She  Is taking  Azo   So  She  Does  Not     Know  If  She  Has  Any blood  In  Urine   She  Is  Voiding  More  Frequently   Yet  She  Is  Drinking lots  Of fluid    She  Denies  Any  Fever    She    Reports  No  Obvious  Chills    Yet   Her  Hands  Are  Cold  All the time  She  States  . Her pain scale  Is  3  .  She  Wants  To  Be  She  States  To  Determine if  She has a  Kidney  Stone  Or  A  uti .  She  Was  Advised  She  Would  Be  Getting a  Return call  From the  Office .

## 2017-05-17 ENCOUNTER — Encounter: Payer: Self-pay | Admitting: Internal Medicine

## 2017-05-17 ENCOUNTER — Ambulatory Visit
Admission: RE | Admit: 2017-05-17 | Discharge: 2017-05-17 | Disposition: A | Discharge status: Home / Self Care | Payer: Medicare Other | Source: Ambulatory Visit | Attending: Internal Medicine | Admitting: Internal Medicine

## 2017-05-17 ENCOUNTER — Ambulatory Visit (INDEPENDENT_AMBULATORY_CARE_PROVIDER_SITE_OTHER): Payer: Medicare Other | Admitting: Internal Medicine

## 2017-05-17 VITALS — BP 124/86 | HR 44 | Temp 97.5°F | Resp 15 | Ht 62.0 in | Wt 170.1 lb

## 2017-05-17 DIAGNOSIS — R1032 Left lower quadrant pain: Secondary | ICD-10-CM

## 2017-05-17 DIAGNOSIS — R310 Gross hematuria: Secondary | ICD-10-CM | POA: Insufficient documentation

## 2017-05-17 DIAGNOSIS — R319 Hematuria, unspecified: Secondary | ICD-10-CM | POA: Diagnosis not present

## 2017-05-17 DIAGNOSIS — K5710 Diverticulosis of small intestine without perforation or abscess without bleeding: Secondary | ICD-10-CM | POA: Insufficient documentation

## 2017-05-17 DIAGNOSIS — Z9049 Acquired absence of other specified parts of digestive tract: Secondary | ICD-10-CM | POA: Insufficient documentation

## 2017-05-17 DIAGNOSIS — Z90710 Acquired absence of both cervix and uterus: Secondary | ICD-10-CM | POA: Diagnosis not present

## 2017-05-17 DIAGNOSIS — I700 Atherosclerosis of aorta: Secondary | ICD-10-CM | POA: Insufficient documentation

## 2017-05-17 DIAGNOSIS — I708 Atherosclerosis of other arteries: Secondary | ICD-10-CM | POA: Diagnosis not present

## 2017-05-17 DIAGNOSIS — R300 Dysuria: Secondary | ICD-10-CM

## 2017-05-17 LAB — URINALYSIS, ROUTINE W REFLEX MICROSCOPIC
NITRITE: POSITIVE — AB
PH: 6.5 (ref 5.0–8.0)
Specific Gravity, Urine: 1.015 (ref 1.000–1.030)
Total Protein, Urine: >=300 — AB
Urine Glucose: 250 — AB
Urobilinogen, UA: >=8 — AB (ref 0.0–1.0)

## 2017-05-17 MED ORDER — HYDROCODONE-ACETAMINOPHEN 10-325 MG PO TABS: 1.0000 | ORAL_TABLET | Freq: Four times a day (QID) | ORAL | 0 refills | Status: DC | PRN

## 2017-05-17 MED ORDER — CIPROFLOXACIN HCL 250 MG PO TABS: 250.0000 mg | ORAL_TABLET | Freq: Two times a day (BID) | ORAL | 0 refills | Status: DC

## 2017-05-17 NOTE — Progress Notes (Signed)
Subjective:  Patient ID: Linda [name], female    DOB: [birth year]  Age: 83 y.o. MRN: [id number]  CC: The primary encounter diagnosis was Dysuria. Diagnoses of Left lower quadrant pain, Gross hematuria, and Hematuria, undiagnosed cause were also pertinent to this visit.  HPI [facility] presents for frequency and dysuria that has been present for the past 2 weeks.  Has been taking azo for 4 days. She was treated empirically for UTI 2 weeks and with ciprofloxacin but stopped the medication  when culture was negative.   Symptoms started on Nov 11 with burnign and suprapubic pain,  On Nov 12 passed urine with bloody clots  (2 episodes);   Urine became clear after that.  Started taking azo which helped the pain.  Urine culture was negative on nov 12. States that she was symptom free for a few days.(took cipro on nov 12 , 13 and 14) .  Urine culture was negative drawn on Nov 12   REMOTE HISTORY OF KIDNEY STONES REQUIRING LITHOTRIPSY . Not sexually active No history of swimming pool or hot tub use.    Outpatient Medications Prior to Visit  Medication Sig Dispense Refill  . Cholecalciferol (VITAMIN D) 2000 units tablet Take 2,000 Units by mouth daily.    . Coenzyme Q10 (CO Q-10) 200 MG CAPS Take 1 Dose by mouth 2 (two) times daily.    Marland Kitchen COUMADIN 5 MG tablet Take as directed by coumadin clinic 90 tablet 1  . feeding supplement (BOOST HIGH PROTEIN) LIQD Take 1 Container by mouth daily.    Marland Kitchen LORazepam (ATIVAN) 0.5 MG tablet Take 0.5 tablets (0.25 mg total) by mouth at bedtime. If needed for insomnia 30 tablet 3  . losartan (COZAAR) 50 MG tablet TAKE 2 TABLETS (100 MG TOTAL) BY MOUTH DAILY. 180 tablet 2  . Multiple Vitamins-Minerals (HAIR SKIN AND NAILS FORMULA PO) Take by mouth daily.    Earney Navy Bicarbonate (ZEGERID PO) Take 1 tablet by mouth daily as needed.    Marland Kitchen PARoxetine (PAXIL) 10 MG tablet Take 1 tablet (10 mg total) by mouth daily. After dinner (Patient not taking: Reported on  02/26/2017) 30 tablet 2   No facility-administered medications prior to visit.     Review of Systems;  Patient denies headache, fevers, malaise, unintentional weight loss, skin rash, eye pain, sinus congestion and sinus pain, sore throat, dysphagia,  hemoptysis , cough, dyspnea, wheezing, chest pain, palpitations, orthopnea, edema, abdominal pain, nausea, melena, diarrhea, constipation, flank pain,  nocturia, numbness, tingling, seizures,  Focal weakness, Loss of consciousness,  Tremor, insomnia, depression, anxiety, and suicidal ideation.      Objective:  BP 124/86 (BP Location: Left Arm, Patient Position: Sitting, Cuff Size: Normal)   Pulse (!) 44   Temp (!) 97.5 F (36.4 C) (Oral)   Resp 15   Ht 5\' 2"  (1.575 m)   Wt 170 lb 1.9 oz (77.2 kg)   SpO2 95%   BMI 31.12 kg/m   BP Readings from Last 3 Encounters:  05/17/17 124/86  02/26/17 128/70  01/30/17 120/82    Wt Readings from Last 3 Encounters:  05/17/17 170 lb 1.9 oz (77.2 kg)  02/26/17 170 lb 12.8 oz (77.5 kg)  01/30/17 172 lb 6.4 oz (78.2 kg)    General appearance: alert, cooperative and appears stated age Back: symmetric, no curvature. ROM normal. No CVA tenderness. Lungs: clear to auscultation bilaterally Heart: regular rate and rhythm, S1, S2 normal, no murmur, click, rub or gallop Abdomen: soft,  bowel sounds normal; no masses,  no organomegaly. Mild suprapubic pain with palpation,  No guarding  Pulses: 2+ and symmetric Skin: Skin color, texture, turgor normal. No rashes or lesions Lymph nodes: Cervical, supraclavicular, and axillary nodes normal.  Lab Results  Component Value Date   HGBA1C 6.4 04/30/2017   HGBA1C 6.6 (H) 10/23/2016   HGBA1C 6.2 09/25/2016    Lab Results  Component Value Date   CREATININE 0.81 04/30/2017   CREATININE 0.82 10/23/2016   CREATININE 0.83 04/27/2016    Lab Results  Component Value Date   WBC 5.6 04/27/2016   HGB 15.8 (H) 04/27/2016   HCT 47.0 (H) 04/27/2016   PLT 188.0  04/27/2016   GLUCOSE 132 (H) 04/30/2017   CHOL 186 04/30/2017   TRIG 56.0 04/30/2017   HDL 67.70 04/30/2017   LDLDIRECT 90.0 10/23/2016   LDLCALC 107 (H) 04/30/2017   ALT 18 04/30/2017   AST 22 04/30/2017   NA 139 04/30/2017   K 3.6 04/30/2017   CL 101 04/30/2017   CREATININE 0.81 04/30/2017   BUN 20 04/30/2017   CO2 30 04/30/2017   TSH 1.48 04/27/2016   INR 3.4 (H) 10/03/2016   HGBA1C 6.4 04/30/2017   MICROALBUR 12.1 (H) 04/30/2017     Assessment & Plan:   Problem List Items Addressed This Visit    Hematuria, undiagnosed cause    CT scan ordered to rule out stones,  Which was negative.  She was treated empirically for UTI 2 weeks ago with cipro and stopped it after 2 days when culture was negative.  repeat culture is again negative.   If symptoms persist she  will need urology evaluation with cystoscopy        Other Visit Diagnoses    Dysuria    -  Primary   Relevant Orders   Urine Culture (Completed)   Urinalysis, Routine w reflex microscopic (Completed)   CT RENAL STONE STUDY (Completed)   Left lower quadrant pain       Relevant Orders   CT RENAL STONE STUDY (Completed)   Gross hematuria       Relevant Orders   CT RENAL STONE STUDY (Completed)     A total of 25 minutes of face to face time was spent with patient more than half of which was spent in counselling about the above mentioned conditions  and coordination of care   I have discontinued Linda [name] PARoxetine. I am also having her start on ciprofloxacin and HYDROcodone-acetaminophen. Additionally, I am having her maintain her COUMADIN, Vitamin D, Co Q-10, losartan, Multiple Vitamins-Minerals (HAIR SKIN AND NAILS FORMULA PO), feeding supplement, LORazepam, and Omeprazole-Sodium Bicarbonate (ZEGERID PO).  Meds ordered this encounter  Medications  . ciprofloxacin (CIPRO) 250 MG tablet    Sig: Take 1 tablet (250 mg total) by mouth 2 (two) times daily.    Dispense:  14 tablet    Refill:  0  .  HYDROcodone-acetaminophen (NORCO) 10-325 MG tablet    Sig: Take 1 tablet by mouth every 6 (six) hours as needed.    Dispense:  30 tablet    Refill:  0    Medications Discontinued During This Encounter  Medication Reason  . PARoxetine (PAXIL) 10 MG tablet Patient has not taken in last 30 days    Follow-up: No Follow-up on file.   Crecencio Mc, MD

## 2017-05-17 NOTE — Patient Instructions (Addendum)
I AM TREATING YOU FOR A BLADDER INFECTION  THE CT SCAN WILL RULE OUT A STONE  IF BOTH TESTS ARE NORMAL,  I WILL REFER YOU TO UROLOGY FOR A CYSTOSCOPY

## 2017-05-18 ENCOUNTER — Encounter: Payer: Self-pay | Admitting: Internal Medicine

## 2017-05-18 LAB — URINE CULTURE
MICRO NUMBER:: 81342191
SPECIMEN QUALITY:: ADEQUATE

## 2017-05-20 ENCOUNTER — Encounter: Payer: Self-pay | Admitting: Internal Medicine

## 2017-05-20 DIAGNOSIS — R319 Hematuria, unspecified: Secondary | ICD-10-CM | POA: Insufficient documentation

## 2017-05-20 NOTE — Assessment & Plan Note (Signed)
CT scan ordered to rule out stones,  Which was negative.  She was treated empirically for UTI 2 weeks ago with cipro and stopped it after 2 days when culture was negative.  repeat culture is again negative.   If symptoms persist she  will need urology evaluation with cystoscopy

## 2017-05-21 ENCOUNTER — Encounter: Payer: Self-pay | Admitting: Internal Medicine

## 2017-05-22 ENCOUNTER — Other Ambulatory Visit: Payer: Self-pay | Admitting: Internal Medicine

## 2017-05-22 DIAGNOSIS — N3090 Cystitis, unspecified without hematuria: Secondary | ICD-10-CM

## 2017-05-27 ENCOUNTER — Encounter: Payer: Self-pay | Admitting: Internal Medicine

## 2017-05-29 ENCOUNTER — Ambulatory Visit: Payer: Self-pay

## 2017-05-29 NOTE — Telephone Encounter (Signed)
  Reason for Disposition . [1] Taking antibiotic < 72 hours (3 days) for UTI AND [2] painful urination or frequency not improved  Answer Assessment - Initial Assessment Questions 1. ANTIBIOTIC: "What antibiotic are you taking?" "How many times per day?"     Finished some Cipro Sunday 2. DURATION: "When was the antibiotic started?"     Started this past Friday 3. MAIN SYMPTOM: "What is the main symptom you are concerned about?"     Burning and pain with urination 4. FEVER: "Do you have a fever?" If so, ask: "What is it, how was it measured, and when did it start?"     Maybe low grade 5. OTHER SYMPTOMS: "Do you have any other symptoms?" (e.g., flank pain, vaginal discharge, blood in urine)     No  Protocols used: URINARY TRACT INFECTION ON ANTIBIOTIC FOLLOW-UP CALL - FEMALE-A-AH Pt. Is still having symptoms and request a refill of Cipro be called in. States it helps her symptoms. Offered appointment and states she is hoping for the refill first.

## 2017-05-30 NOTE — Telephone Encounter (Signed)
Please advise 

## 2017-05-30 NOTE — Telephone Encounter (Signed)
SHE SHOULD NOT BE TAKING ANTIBIOTICS WITHOUT A CULTURE PROVEN UTI.  I SEE NO RECENT URINE CULTURE THAT SUGGESTS INFECTION , SO WITHOUT A URINALYSIS AND CULTURE I WILL NOT REFILL CIPRO.  SHE HAS BEEN REFERRED TO UROLOGY (SEE REFERRAL December 4TH,  DOES SHE HAVE AN APPT YET?)

## 2017-05-31 NOTE — Telephone Encounter (Signed)
Mychart message was sent and pt read it on 05/30/2017.

## 2017-06-05 ENCOUNTER — Ambulatory Visit: Payer: Self-pay | Admitting: Urology

## 2017-06-26 ENCOUNTER — Encounter: Payer: Self-pay | Admitting: Urology

## 2017-06-26 ENCOUNTER — Ambulatory Visit (INDEPENDENT_AMBULATORY_CARE_PROVIDER_SITE_OTHER): Payer: Medicare Other | Admitting: Urology

## 2017-06-26 VITALS — BP 120/76 | HR 80 | Ht 63.0 in | Wt 170.0 lb

## 2017-06-26 DIAGNOSIS — N952 Postmenopausal atrophic vaginitis: Secondary | ICD-10-CM

## 2017-06-26 DIAGNOSIS — N3020 Other chronic cystitis without hematuria: Secondary | ICD-10-CM | POA: Diagnosis not present

## 2017-06-26 DIAGNOSIS — N390 Urinary tract infection, site not specified: Secondary | ICD-10-CM

## 2017-06-26 LAB — BLADDER SCAN AMB NON-IMAGING

## 2017-06-26 MED ORDER — ESTRADIOL 0.1 MG/GM VA CREA
1.0000 g | TOPICAL_CREAM | VAGINAL | 12 refills | Status: DC
Start: 2017-06-27 — End: 2020-06-23

## 2017-06-26 NOTE — Progress Notes (Signed)
06/26/2017 3:11 PM   Linda [name] 1933/10/07 [id number]  Referring provider: Crecencio Mc, MD [place], [place] [address]  Chief Complaint  Patient presents with  . Cystitis    New Patient    HPI: 84 year old female who presents today for further evaluation of recurrent urinary tract infections.  She has numerous infections over the past year including E. coli on 11/01/2016 as well as frankly positive urinalyses on 04/30/2017 as well as 05/17/2017.  On these occasions, the urine culture was negative but she had self treated with antibiotics prior to the culture being sent (self treated with leftover Cipro not under the care of a provider.   With each infection, she complains of dysuria, increased urinary frequency and urgency.  She also endorses suprapubic discomfort.  No associated fevers.  She reports that with each infection, she does have gross hematuria, occasionally with clots.   She does have some mild baseline urinary symptoms including nocturia nocturia x 3-4.  She denies any daytime urinary symptoms.  She does have very mild baseline stress incontinence.  She denies any dysuria or gross hematuria today.  She is not sexually active.  No complaints of vaginal bulging or discomfort.  She does report having her bladder "tacked" at the time of hysterectomy.  PVR minimal.   Previous imaging includes CT stone protocol on 05/17/2017 ordered by her PCP for further evaluation of gross hematuria in the setting of infection.  No urinary tract calculi or abnormalities were identified.  PMH: Past Medical History:  Diagnosis Date  . Anxiety   . Arrhythmia    paroxysmal atrial fibrillation  . Colon polyp   . Diverticulitis   . Hard of hearing   . Hyperlipidemia   . Kidney stone   . Parathyroid disease ([employer])    Parathyroidectomy   . PVC (premature ventricular contraction)   . Skin cancer   . UTI (lower urinary tract infection)     Surgical  History: Past Surgical History:  Procedure Laterality Date  . CHOLECYSTECTOMY  2002  . LITHOTRIPSY    . PARATHYROIDECTOMY  2004   1 removed  . ROTATOR CUFF REPAIR  2002  . TONSILLECTOMY  1956  . TONSILLECTOMY    . TOTAL ABDOMINAL HYSTERECTOMY W/ BILATERAL SALPINGOOPHORECTOMY  1984  . VEIN LIGATION AND STRIPPING      Home Medications:  Allergies as of 06/26/2017      Reactions   Crestor [rosuvastatin Calcium] Other (See Comments)   Leg pain. With 10 mg dose or higher   Penicillins    Sulfa Antibiotics Other (See Comments)   Sulfonamide Derivatives       Medication List        Accurate as of 06/26/17 11:59 PM. Always use your most recent med list.          amLODipine 2.5 MG tablet Commonly known as:  NORVASC   Co Q-10 200 MG Caps Take 1 Dose by mouth 2 (two) times daily.   COUMADIN 5 MG tablet Generic drug:  warfarin Take as directed by coumadin clinic   estradiol 0.1 MG/GM vaginal cream Commonly known as:  ESTRACE Place 1 g vaginally 3 (three) times a week. Pea sized amount per urethra   feeding supplement Liqd Take 1 Container by mouth daily.   LORazepam 0.5 MG tablet Commonly known as:  ATIVAN Take 0.5 tablets (0.25 mg total) by mouth at bedtime. If needed for insomnia   losartan 50 MG tablet Commonly known  as:  COZAAR TAKE 2 TABLETS (100 MG TOTAL) BY MOUTH DAILY.   Vitamin D 2000 units tablet Take 2,000 Units by mouth daily.   ZEGERID PO Take 1 tablet by mouth daily as needed.       Allergies:  Allergies  Allergen Reactions  . Crestor [Rosuvastatin Calcium] Other (See Comments)    Leg pain. With 10 mg dose or higher  . Penicillins   . Sulfa Antibiotics Other (See Comments)  . Sulfonamide Derivatives     Family History: Family History  Problem Relation Age of Onset  . Heart disease Brother   . Stroke Mother   . Skin cancer Father   . Breast cancer Paternal Grandmother     Social History:  reports that  has never smoked. she has never  used smokeless tobacco. She reports that she does not drink alcohol or use drugs.  ROS: UROLOGY Frequent Urination?: Yes Hard to postpone urination?: Yes Burning/pain with urination?: Yes Get up at night to urinate?: Yes Leakage of urine?: No Urine stream starts and stops?: No Trouble starting stream?: Yes Do you have to strain to urinate?: No Blood in urine?: Yes Urinary tract infection?: Yes Sexually transmitted disease?: No Injury to kidneys or bladder?: No Painful intercourse?: No Weak stream?: No Currently pregnant?: No Vaginal bleeding?: No Last menstrual period?: n  Gastrointestinal Nausea?: No Vomiting?: No Indigestion/heartburn?: Yes Diarrhea?: No Constipation?: No  Constitutional Fever: No Night sweats?: No Weight loss?: No Fatigue?: No  Skin Skin rash/lesions?: No Itching?: No  Eyes Blurred vision?: No Double vision?: No  Ears/Nose/Throat Sore throat?: No Sinus problems?: Yes  Hematologic/Lymphatic Swollen glands?: No Easy bruising?: Yes  Cardiovascular Leg swelling?: No Chest pain?: No  Respiratory Cough?: Yes Shortness of breath?: No  Endocrine Excessive thirst?: No  Musculoskeletal Back pain?: No Joint pain?: No  Neurological Headaches?: No Dizziness?: No  Psychologic Depression?: No Anxiety?: Yes  Physical Exam: BP 120/76   Pulse 80   Ht 5\' 3"  (1.6 m)   Wt 170 lb (77.1 kg)   BMI 30.11 kg/m   Constitutional:  Alert and oriented, No acute distress. HEENT:  AT, moist mucus membranes.  Trachea midline, no masses. Cardiovascular: No clubbing, cyanosis, or edema. Respiratory: Normal respiratory effort, no increased work of breathing. GI: Abdomen is soft, nontender, nondistended, no abdominal masses GU: No CVA tenderness.  Pelvic: Vaginitis appreciated.  Urethral meatus with small urethral carbuncle appreciated.  Fairly decent vaginal vault support without any evidence of prolapse. Skin: No rashes, bruises or suspicious  lesions. Neurologic: Grossly intact, no focal deficits, moving all 4 extremities. Psychiatric: Normal mood and affect.  Laboratory Data: Lab Results  Component Value Date   WBC 5.6 04/27/2016   HGB 15.8 (H) 04/27/2016   HCT 47.0 (H) 04/27/2016   MCV 94.8 04/27/2016   PLT 188.0 04/27/2016    Lab Results  Component Value Date   CREATININE 0.81 04/30/2017    Lab Results  Component Value Date   HGBA1C 6.4 04/30/2017    Urinalysis Results for orders placed or performed in visit on 06/26/17  CULTURE, URINE COMPREHENSIVE  Result Value Ref Range   Urine Culture, Comprehensive Final report (A)    Organism ID, Bacteria Escherichia coli (A)    ANTIMICROBIAL SUSCEPTIBILITY Comment   Microscopic Examination  Result Value Ref Range   WBC, UA 11-30 (A) 0 - 5 /hpf   RBC, UA None seen 0 - 2 /hpf   Epithelial Cells (non renal) 0-10 0 - 10 /hpf  Mucus, UA Present (A) Not Estab.   Bacteria, UA Moderate (A) None seen/Few  Urinalysis, Complete  Result Value Ref Range   Specific Gravity, UA 1.025 1.005 - 1.030   pH, UA 5.0 5.0 - 7.5   Color, UA Yellow Yellow   Appearance Ur Cloudy (A) Clear   Leukocytes, UA 1+ (A) Negative   Protein, UA 1+ (A) Negative/Trace   Glucose, UA Negative Negative   Ketones, UA Negative Negative   RBC, UA Trace (A) Negative   Bilirubin, UA Negative Negative   Urobilinogen, Ur 0.2 0.2 - 1.0 mg/dL   Nitrite, UA Negative Negative   Microscopic Examination See below:   BLADDER SCAN AMB NON-IMAGING  Result Value Ref Range   Scan Result 50ml    Multiple previous UA/urine culture data was reviewed from over the past year through epic.  Pertinent Imaging: Results for orders placed during the hospital encounter of 05/17/17  CT RENAL STONE STUDY   Narrative CLINICAL DATA:  Gross hematuria  EXAM: CT ABDOMEN AND PELVIS WITHOUT CONTRAST  TECHNIQUE: Multidetector CT imaging of the abdomen and pelvis was performed following the standard protocol without IV  contrast.  COMPARISON:  03/12/2013  FINDINGS: Lower chest: Heart is mildly enlarged. No confluent opacities or effusions.  Hepatobiliary: Prior cholecystectomy.  No focal hepatic abnormality.  Pancreas: No focal abnormality or ductal dilatation.  Spleen: No focal abnormality.  Normal size.  Adrenals/Urinary Tract: No adrenal abnormality. No focal renal abnormality. No stones or hydronephrosis. Urinary bladder is unremarkable.  Stomach/Bowel: Extensive left colonic diverticulosis. No active diverticulitis. Stomach and small bowel decompressed. Scattered distal small bowel diverticula also noted. No diverticulitis.  Vascular/Lymphatic: Aortic and iliac calcifications. No evidence of aneurysm or adenopathy.  Reproductive: Prior hysterectomy.  No adnexal masses.  Other: No free fluid or free air.  Musculoskeletal: No acute bony abnormality.  IMPRESSION: No renal or ureteral stones.  No hydronephrosis.  Left colonic and distal small bowel diverticulosis. No active diverticulitis.  Aortoiliac atherosclerosis.  Prior cholecystectomy and hysterectomy.  No acute findings.   Electronically Signed   By: Rolm Baptise M.D.   On: 05/17/2017 09:46      Assessment & Plan:    1. Recurrent UTI Lengthy discussion today about antibiotic stewardship Reiterated today that all treatment of urinary tract infection should be performed by a medical provider given the concern for antibiotic resistance and other complications Advised that she may present to our office any time for UA/urine cultures for nurse visit as needed when she feels like she has symptoms of UTI Urine culture sent today as UA is borderline suspicious for persistent/recurrent infection despite her being relatively asymptomatic Recommend probiotic daily and cranberry tabs twice daily In addition, she does have evidence of atrophic vaginitis, see recs below - Urinalysis, Complete - BLADDER SCAN AMB NON-IMAGING -  CULTURE, URINE COMPREHENSIVE  2. Atrophic vaginitis Evidence of atrophic vaginitis on pelvic exam today Topical estrogen cream may be beneficial for UTI prevention in postmenopausal women Reviewed anatomy and application of pea-sized amount per urethra 3 times per week No contraindications for this medication   Return if symptoms worsen or fail to improve.  Hollice Espy, MD  [facility] [address] [place], [place] [address] [phone]  I spent 45 min with this patient of which greater than 50% was spent in counseling and coordination of care with the patient.

## 2017-06-27 LAB — URINALYSIS, COMPLETE
Bilirubin, UA: NEGATIVE
Glucose, UA: NEGATIVE
Ketones, UA: NEGATIVE
NITRITE UA: NEGATIVE
PH UA: 5 (ref 5.0–7.5)
Specific Gravity, UA: 1.025 (ref 1.005–1.030)
Urobilinogen, Ur: 0.2 mg/dL (ref 0.2–1.0)

## 2017-06-27 LAB — MICROSCOPIC EXAMINATION: RBC, UA: NONE SEEN /hpf (ref 0–?)

## 2017-06-29 ENCOUNTER — Telehealth: Payer: Self-pay

## 2017-06-29 LAB — CULTURE, URINE COMPREHENSIVE

## 2017-06-29 MED ORDER — NITROFURANTOIN MONOHYD MACRO 100 MG PO CAPS: 100.0000 mg | ORAL_CAPSULE | Freq: Two times a day (BID) | ORAL | 0 refills | Status: DC

## 2017-06-29 NOTE — Telephone Encounter (Signed)
-----   Message from Hollice Espy, MD sent at 06/29/2017 12:38 PM EST ----- Please treat with Macrobid 100 mg twice daily for 10 days.  She grew E. coli in her urine.  Hollice Espy, MD

## 2017-06-29 NOTE — Telephone Encounter (Signed)
Patient notified on vmail, script sent to pharm  

## 2017-07-19 ENCOUNTER — Other Ambulatory Visit: Payer: Self-pay | Admitting: Internal Medicine

## 2017-07-19 DIAGNOSIS — Z1231 Encounter for screening mammogram for malignant neoplasm of breast: Secondary | ICD-10-CM

## 2017-08-03 ENCOUNTER — Other Ambulatory Visit: Payer: Self-pay | Admitting: Internal Medicine

## 2017-08-03 NOTE — Telephone Encounter (Signed)
Pt calling in to let Dr. Derrel Nip know she has enough for 3 days.

## 2017-08-08 ENCOUNTER — Ambulatory Visit (INDEPENDENT_AMBULATORY_CARE_PROVIDER_SITE_OTHER): Payer: Medicare Other | Admitting: Internal Medicine

## 2017-08-08 ENCOUNTER — Encounter: Payer: Self-pay | Admitting: Internal Medicine

## 2017-08-08 VITALS — BP 120/78 | HR 46 | Temp 97.8°F | Resp 15 | Ht 63.0 in | Wt 175.4 lb

## 2017-08-08 DIAGNOSIS — I70219 Atherosclerosis of native arteries of extremities with intermittent claudication, unspecified extremity: Secondary | ICD-10-CM

## 2017-08-08 DIAGNOSIS — E559 Vitamin D deficiency, unspecified: Secondary | ICD-10-CM | POA: Diagnosis not present

## 2017-08-08 DIAGNOSIS — I482 Chronic atrial fibrillation, unspecified: Secondary | ICD-10-CM

## 2017-08-08 DIAGNOSIS — R310 Gross hematuria: Secondary | ICD-10-CM | POA: Diagnosis not present

## 2017-08-08 DIAGNOSIS — E7800 Pure hypercholesterolemia, unspecified: Secondary | ICD-10-CM

## 2017-08-08 DIAGNOSIS — E1129 Type 2 diabetes mellitus with other diabetic kidney complication: Secondary | ICD-10-CM | POA: Diagnosis not present

## 2017-08-08 DIAGNOSIS — C44712 Basal cell carcinoma of skin of right lower limb, including hip: Secondary | ICD-10-CM

## 2017-08-08 DIAGNOSIS — R809 Proteinuria, unspecified: Secondary | ICD-10-CM | POA: Diagnosis not present

## 2017-08-08 DIAGNOSIS — I10 Essential (primary) hypertension: Secondary | ICD-10-CM

## 2017-08-08 DIAGNOSIS — Z5181 Encounter for therapeutic drug level monitoring: Secondary | ICD-10-CM

## 2017-08-08 LAB — POCT GLYCOSYLATED HEMOGLOBIN (HGB A1C): Hemoglobin A1C: 6

## 2017-08-08 NOTE — Assessment & Plan Note (Signed)
Well controlled.  Anticoagulated on coumadin .

## 2017-08-08 NOTE — Assessment & Plan Note (Signed)
Well controlled on current regimen. Renal function stable, no changes today.  Lab Results  Component Value Date   CREATININE 0.81 04/30/2017   Lab Results  Component Value Date   NA 139 04/30/2017   K 3.6 04/30/2017   CL 101 04/30/2017   CO2 30 04/30/2017

## 2017-08-08 NOTE — Assessment & Plan Note (Signed)
She will suspend her coumadin 3 days before her excisional biopsy of the RLE  Scheduled for May 15 to prevent excessive bleeding,  And resume the day after the biopsy unless Dr Kellie Moor feels an additional wait period is needed

## 2017-08-08 NOTE — Assessment & Plan Note (Addendum)
Untreated due to prior intolerance of statin therapy.   However, her LDL is at goal on diet alone.   Lab Results  Component Value Date   CHOL 186 04/30/2017   HDL 67.70 04/30/2017   LDLCALC 107 (H) 04/30/2017   LDLDIRECT 90.0 10/23/2016   TRIG 56.0 04/30/2017   CHOLHDL 3 04/30/2017

## 2017-08-08 NOTE — Progress Notes (Signed)
Subjective:  Patient ID: Linda [name], female    DOB: 09-28-1933  Age: 84 y.o. MRN: [id number]  CC: The primary encounter diagnosis was Controlled type 2 diabetes mellitus with microalbuminuria, without long-term current use of insulin ([place]). Diagnoses of Vitamin D deficiency, Chronic atrial fibrillation ([place]), Pure hypercholesterolemia, Essential hypertension, Microalbuminuria due to type 2 diabetes mellitus ([place]), Encounter for therapeutic drug monitoring, Basal cell carcinoma (BCC) of right lower extremity, Hematuria, gross, and Extremity atherosclerosis with intermittent claudication ([place]) were also pertinent to this visit.  HPI [facility] presents for 3 month follow up on diabetes.  Patient has no complaints today.  Patient is following a low glycemic index diet and taking all prescribed medications regularly without side effects.  Fasting sugars have been under less than 140 most of the time and post prandials have been under 160 except on rare occasions. Patient is exercising about 3 times per week and intentionally trying to lose weight .  Patient has had an eye exam in the last 12 months and checks feet regularly for signs of infection.  Patient does not walk barefoot outside,  And denies an numbness tingling or burning in feet. Patient is up to date on all recommended vaccinations  isenstein biopsied  right LE lesion  Basal cell carcinoma . Full excision planned  for march 15. We discussed a temporary suspension of her coumadin to avoid excessive bleeding.   Nocturia x 2  . No trouble falling back asleep.  No incontinence.   Lab Results  Component Value Date   HGBA1C 6.0 08/08/2017     Outpatient Medications Prior to Visit  Medication Sig Dispense Refill  . amLODipine (NORVASC) 2.5 MG tablet     . Cholecalciferol (VITAMIN D) 2000 units tablet Take 2,000 Units by mouth daily.    . Coenzyme Q10 (CO Q-10) 200 MG CAPS Take 1 Dose by mouth 2 (two) times daily.    Marland Kitchen COUMADIN 5 MG  tablet Take as directed by coumadin clinic 90 tablet 1  . estradiol (ESTRACE) 0.1 MG/GM vaginal cream Place 1 g vaginally 3 (three) times a week. Pea sized amount per urethra 42.5 g 12  . feeding supplement (BOOST HIGH PROTEIN) LIQD Take 1 Container by mouth daily.    Marland Kitchen LORazepam (ATIVAN) 0.5 MG tablet Take 0.5 tablets (0.25 mg total) by mouth at bedtime. If needed for insomnia 30 tablet 3  . losartan (COZAAR) 50 MG tablet TAKE 2 TABLETS (100 MG TOTAL) BY MOUTH DAILY. 180 tablet 1  . metroNIDAZOLE (METROCREAM) 0.75 % cream APPLY TO FACE TWICE DAILY. WEAR SUNSCREEN EVERY AM.  5  . Omeprazole-Sodium Bicarbonate (ZEGERID PO) Take 1 tablet by mouth daily as needed.    . nitrofurantoin, macrocrystal-monohydrate, (MACROBID) 100 MG capsule Take 1 capsule (100 mg total) by mouth every 12 (twelve) hours. (Patient not taking: Reported on 08/08/2017) 20 capsule 0   No facility-administered medications prior to visit.     Review of Systems;  Patient denies headache, fevers, malaise, unintentional weight loss, skin rash, eye pain, sinus congestion and sinus pain, sore throat, dysphagia,  hemoptysis , cough, dyspnea, wheezing, chest pain, palpitations, orthopnea, edema, abdominal pain, nausea, melena, diarrhea, constipation, flank pain, dysuria, hematuria, urinary  Frequency, nocturia, numbness, tingling, seizures,  Focal weakness, Loss of consciousness,  Tremor, insomnia, depression, anxiety, and suicidal ideation.      Objective:  BP 120/78 (BP Location: Left Arm, Patient Position: Sitting, Cuff Size: Normal)   Pulse (!) 46   Temp 97.8  F (36.6 C) (Oral)   Resp 15   Ht 5\' 3"  (1.6 m)   Wt 175 lb 6.4 oz (79.6 kg)   SpO2 95%   BMI 31.07 kg/m   BP Readings from Last 3 Encounters:  08/08/17 120/78  06/26/17 120/76  05/17/17 124/86    Wt Readings from Last 3 Encounters:  08/08/17 175 lb 6.4 oz (79.6 kg)  06/26/17 170 lb (77.1 kg)  05/17/17 170 lb 1.9 oz (77.2 kg)    General appearance: alert,  cooperative and appears stated age Ears: normal TM's and external ear canals both ears Throat: lips, mucosa, and tongue normal; teeth and gums normal Neck: no adenopathy, no carotid bruit, supple, symmetrical, trachea midline and thyroid not enlarged, symmetric, no tenderness/mass/nodules Back: symmetric, no curvature. ROM normal. No CVA tenderness. Lungs: clear to auscultation bilaterally Heart: regular rate and rhythm, S1, S2 normal, no murmur, click, rub or gallop Abdomen: soft, non-tender; bowel sounds normal; no masses,  no organomegaly Pulses: 2+ and symmetric Skin: RLE with erythematous macular lesion, eschar in center Skin color, texture, turgor normal. No rashes or lesions Lymph nodes: Cervical, supraclavicular, and axillary nodes normal.  Lab Results  Component Value Date   HGBA1C 6.0 08/08/2017   HGBA1C 6.4 04/30/2017   HGBA1C 6.6 (H) 10/23/2016    Lab Results  Component Value Date   CREATININE 0.81 04/30/2017   CREATININE 0.82 10/23/2016   CREATININE 0.83 04/27/2016    Lab Results  Component Value Date   WBC 5.6 04/27/2016   HGB 15.8 (H) 04/27/2016   HCT 47.0 (H) 04/27/2016   PLT 188.0 04/27/2016   GLUCOSE 132 (H) 04/30/2017   CHOL 186 04/30/2017   TRIG 56.0 04/30/2017   HDL 67.70 04/30/2017   LDLDIRECT 90.0 10/23/2016   LDLCALC 107 (H) 04/30/2017   ALT 18 04/30/2017   AST 22 04/30/2017   NA 139 04/30/2017   K 3.6 04/30/2017   CL 101 04/30/2017   CREATININE 0.81 04/30/2017   BUN 20 04/30/2017   CO2 30 04/30/2017   TSH 1.48 04/27/2016   INR 3.4 (H) 10/03/2016   HGBA1C 6.0 08/08/2017   MICROALBUR 12.1 (H) 04/30/2017    Ct Renal Stone Study  Result Date: 05/17/2017 CLINICAL DATA:  Gross hematuria EXAM: CT ABDOMEN AND PELVIS WITHOUT CONTRAST TECHNIQUE: Multidetector CT imaging of the abdomen and pelvis was performed following the standard protocol without IV contrast. COMPARISON:  03/12/2013 FINDINGS: Lower chest: Heart is mildly enlarged. No confluent  opacities or effusions. Hepatobiliary: Prior cholecystectomy.  No focal hepatic abnormality. Pancreas: No focal abnormality or ductal dilatation. Spleen: No focal abnormality.  Normal size. Adrenals/Urinary Tract: No adrenal abnormality. No focal renal abnormality. No stones or hydronephrosis. Urinary bladder is unremarkable. Stomach/Bowel: Extensive left colonic diverticulosis. No active diverticulitis. Stomach and small bowel decompressed. Scattered distal small bowel diverticula also noted. No diverticulitis. Vascular/Lymphatic: Aortic and iliac calcifications. No evidence of aneurysm or adenopathy. Reproductive: Prior hysterectomy.  No adnexal masses. Other: No free fluid or free air. Musculoskeletal: No acute bony abnormality. IMPRESSION: No renal or ureteral stones.  No hydronephrosis. Left colonic and distal small bowel diverticulosis. No active diverticulitis. Aortoiliac atherosclerosis. Prior cholecystectomy and hysterectomy. No acute findings. Electronically Signed   By: Rolm Baptise M.D.   On: 05/17/2017 09:46    Assessment & Plan:   Problem List Items Addressed This Visit    ATRIAL FIBRILLATION    Well controlled.  Anticoagulated on coumadin .       Hyperlipidemia    Untreated  due to prior intolerance of statin therapy.   However, her LDL is at goal on diet alone.   Lab Results  Component Value Date   CHOL 186 04/30/2017   HDL 67.70 04/30/2017   LDLCALC 107 (H) 04/30/2017   LDLDIRECT 90.0 10/23/2016   TRIG 56.0 04/30/2017   CHOLHDL 3 04/30/2017         Encounter for therapeutic drug monitoring    She will suspend her coumadin 3 days before her excisional biopsy of the RLE  Scheduled for May 15 to prevent excessive bleeding,  And resume the day after the biopsy unless Dr Kellie Moor feels an additional wait period is needed       Essential hypertension    Well controlled on current regimen. Renal function stable, no changes today.  Lab Results  Component Value Date    CREATININE 0.81 04/30/2017   Lab Results  Component Value Date   NA 139 04/30/2017   K 3.6 04/30/2017   CL 101 04/30/2017   CO2 30 04/30/2017         Diabetes mellitus type II, controlled ([place]) - Primary    Well controlled on diet alone.    She  has microalbuminuria  And is already taking an ARB. She is intolerant of Crestor 5 mg every other day  due to leg pain .  Aspirin is relatively contraindicated since she is already taking coumadin .   Lab Results  Component Value Date   HGBA1C 6.0 08/08/2017   Lab Results  Component Value Date   MICROALBUR 12.1 (H) 04/30/2017         Relevant Orders   POCT HgB A1C (Completed)   Comprehensive metabolic panel   Lipid panel   Hemoglobin A1c   Microalbuminuria due to type 2 diabetes mellitus ([facility])     on ARB  Renal function stable, no changes today.  Lab Results  Component Value Date   MICROALBUR 12.1 (H) 04/30/2017     Lab Results  Component Value Date   CREATININE 0.81 04/30/2017   Lab Results  Component Value Date   NA 139 04/30/2017   K 3.6 04/30/2017   CL 101 04/30/2017   CO2 30 04/30/2017         Basal cell carcinoma (BCC) of right lower extremity    For wide excision March 15,  Coumadin suspension discussed with pateint .      Hematuria, gross    S/p urology evaluation with CT abd pelvis in November unrevealing.  Treated for E Coli UTI,  Last Ua normal        Extremity atherosclerosis with intermittent claudication ([place])    She has not tolerated statin therapy.   However, her LDL is at goal on diet alone.   Lab Results  Component Value Date   CHOL 186 04/30/2017   HDL 67.70 04/30/2017   LDLCALC 107 (H) 04/30/2017   LDLDIRECT 90.0 10/23/2016   TRIG 56.0 04/30/2017   CHOLHDL 3 04/30/2017          Other Visit Diagnoses    Vitamin D deficiency       Relevant Orders   VITAMIN D 25 Hydroxy (Vit-D Deficiency, Fractures)    A total of 25 minutes of face to face time was spent with patient more  than half of which was spent in counselling about the above mentioned conditions  and coordination of care   I have discontinued Eryka M. Novak's nitrofurantoin (macrocrystal-monohydrate). I am also having her maintain her  COUMADIN, Vitamin D, Co Q-10, feeding supplement, LORazepam, Omeprazole-Sodium Bicarbonate (ZEGERID PO), amLODipine, estradiol, losartan, and metroNIDAZOLE.  No orders of the defined types were placed in this encounter.   Medications Discontinued During This Encounter  Medication Reason  . nitrofurantoin, macrocrystal-monohydrate, (MACROBID) 100 MG capsule Completed Course    Follow-up: Return in about 6 months (around 02/05/2018) for follow up diabetes, fasting labs 3 months .   Crecencio Mc, MD

## 2017-08-08 NOTE — Assessment & Plan Note (Signed)
S/p urology evaluation with CT abd pelvis in November unrevealing.  Treated for E Coli UTI,  Last Ua normal

## 2017-08-08 NOTE — Assessment & Plan Note (Signed)
She has not tolerated statin therapy.   However, her LDL is at goal on diet alone.   Lab Results  Component Value Date   CHOL 186 04/30/2017   HDL 67.70 04/30/2017   LDLCALC 107 (H) 04/30/2017   LDLDIRECT 90.0 10/23/2016   TRIG 56.0 04/30/2017   CHOLHDL 3 04/30/2017

## 2017-08-08 NOTE — Assessment & Plan Note (Addendum)
Well controlled on diet alone.    She  has microalbuminuria  And is already taking an ARB. She is intolerant of Crestor 5 mg every other day  due to leg pain .  Aspirin is relatively contraindicated since she is already taking coumadin .   Lab Results  Component Value Date   HGBA1C 6.0 08/08/2017   Lab Results  Component Value Date   MICROALBUR 12.1 (H) 04/30/2017

## 2017-08-08 NOTE — Patient Instructions (Addendum)
Suspend your coumadin  prior to the procedure , your last dose of coumadin should be  March 11. You can resume on  March 16 if ok with [place] .    You are not due for fasting labs until May.   If your A1c is < 6.5,   Your visit in May will be just for labs,  And I will see you in August

## 2017-08-08 NOTE — Assessment & Plan Note (Signed)
For wide excision March 15,  Coumadin suspension discussed with pateint .

## 2017-08-08 NOTE — Assessment & Plan Note (Signed)
on ARB  Renal function stable, no changes today.  Lab Results  Component Value Date   MICROALBUR 12.1 (H) 04/30/2017     Lab Results  Component Value Date   CREATININE 0.81 04/30/2017   Lab Results  Component Value Date   NA 139 04/30/2017   K 3.6 04/30/2017   CL 101 04/30/2017   CO2 30 04/30/2017

## 2017-08-10 ENCOUNTER — Encounter: Payer: Self-pay | Admitting: Internal Medicine

## 2017-08-27 ENCOUNTER — Encounter: Payer: Self-pay | Admitting: Internal Medicine

## 2017-08-27 ENCOUNTER — Other Ambulatory Visit: Payer: Self-pay | Admitting: Internal Medicine

## 2017-08-27 ENCOUNTER — Ambulatory Visit
Admission: RE | Admit: 2017-08-27 | Discharge: 2017-08-27 | Disposition: A | Discharge status: Home / Self Care | Payer: Medicare Other | Source: Ambulatory Visit | Attending: Internal Medicine | Admitting: Internal Medicine

## 2017-08-27 DIAGNOSIS — R928 Other abnormal and inconclusive findings on diagnostic imaging of breast: Secondary | ICD-10-CM | POA: Insufficient documentation

## 2017-08-27 DIAGNOSIS — Z1231 Encounter for screening mammogram for malignant neoplasm of breast: Secondary | ICD-10-CM | POA: Diagnosis not present

## 2017-08-27 DIAGNOSIS — R921 Mammographic calcification found on diagnostic imaging of breast: Secondary | ICD-10-CM | POA: Diagnosis not present

## 2017-08-27 DIAGNOSIS — N6489 Other specified disorders of breast: Secondary | ICD-10-CM | POA: Insufficient documentation

## 2017-09-10 ENCOUNTER — Ambulatory Visit
Admission: RE | Admit: 2017-09-10 | Discharge: 2017-09-10 | Disposition: A | Discharge status: Home / Self Care | Payer: Medicare Other | Source: Ambulatory Visit | Attending: Internal Medicine | Admitting: Internal Medicine

## 2017-09-10 DIAGNOSIS — R921 Mammographic calcification found on diagnostic imaging of breast: Secondary | ICD-10-CM

## 2017-09-10 DIAGNOSIS — N6489 Other specified disorders of breast: Secondary | ICD-10-CM | POA: Insufficient documentation

## 2017-09-10 DIAGNOSIS — R928 Other abnormal and inconclusive findings on diagnostic imaging of breast: Secondary | ICD-10-CM

## 2017-09-20 ENCOUNTER — Telehealth: Payer: Self-pay | Admitting: Internal Medicine

## 2017-09-20 NOTE — Telephone Encounter (Signed)
Copied from [place] [phone]. Topic: Inquiry >> Sep 20, 2017  8:52 AM Pricilla Handler wrote: Reason for CRM: Patient called requesting an Urgent Refill of Losartan (COZAAR) 50 MG tablet. Patient is complete out of this medication. Patient's preferred pharmacy is [id number] [address], [place] [phone] (Phone)    [phone] (Fax). Patient would like a call back at her home number once the refill has been sent to the pharmacy.

## 2017-09-20 NOTE — Telephone Encounter (Signed)
Pt's pharmacy was called to verify that the patient did have refills there on her losartan (Cozaar), and that she picked up a 3 month supply in Feb. Pt was called, no answer. Left message for patient to check with her pharmacy and that she should have enough medication to last until May. And also to call back for any questions.

## 2017-09-20 NOTE — Telephone Encounter (Signed)
Copied from [place] [phone]. Topic: Inquiry >> Sep 20, 2017  8:52 AM Pricilla Handler wrote: Reason for CRM: Patient called requesting an Urgent Refill of Losartan (COZAAR) 50 MG tablet. Patient is complete out of this medication. Patient's preferred pharmacy is [id number] [address], [place] [phone] (Phone)    [phone] (Fax). Patient would like a call back at her home number once the refill has been sent to the pharmacy.

## 2017-11-06 ENCOUNTER — Other Ambulatory Visit (INDEPENDENT_AMBULATORY_CARE_PROVIDER_SITE_OTHER): Payer: Medicare Other

## 2017-11-06 DIAGNOSIS — E1129 Type 2 diabetes mellitus with other diabetic kidney complication: Secondary | ICD-10-CM | POA: Diagnosis not present

## 2017-11-06 DIAGNOSIS — E559 Vitamin D deficiency, unspecified: Secondary | ICD-10-CM

## 2017-11-06 DIAGNOSIS — R809 Proteinuria, unspecified: Secondary | ICD-10-CM

## 2017-11-06 LAB — COMPREHENSIVE METABOLIC PANEL
ALBUMIN: 3.9 g/dL (ref 3.5–5.2)
ALK PHOS: 57 U/L (ref 39–117)
ALT: 16 U/L (ref 0–35)
AST: 22 U/L (ref 0–37)
BILIRUBIN TOTAL: 1.4 mg/dL — AB (ref 0.2–1.2)
BUN: 19 mg/dL (ref 6–23)
CALCIUM: 9.4 mg/dL (ref 8.4–10.5)
CO2: 28 meq/L (ref 19–32)
CREATININE: 0.75 mg/dL (ref 0.40–1.20)
Chloride: 103 mEq/L (ref 96–112)
GFR: 78.63 mL/min (ref >60.00)
Glucose, Bld: 130 mg/dL — ABNORMAL HIGH (ref 70–99)
Potassium: 4 mEq/L (ref 3.5–5.1)
Sodium: 140 mEq/L (ref 135–145)
TOTAL PROTEIN: 6.8 g/dL (ref 6.0–8.3)

## 2017-11-06 LAB — LIPID PANEL
CHOL/HDL RATIO: 3
CHOLESTEROL: 203 mg/dL — AB (ref 0–200)
HDL: 69.3 mg/dL (ref >39.00)
LDL Cholesterol: 120 mg/dL — ABNORMAL HIGH (ref 0–99)
NonHDL: 133.41
TRIGLYCERIDES: 65 mg/dL (ref 0.0–149.0)
VLDL: 13 mg/dL (ref 0.0–40.0)

## 2017-11-06 LAB — VITAMIN D 25 HYDROXY (VIT D DEFICIENCY, FRACTURES): VITD: 37.23 ng/mL (ref 30.00–100.00)

## 2017-11-06 LAB — HEMOGLOBIN A1C: Hgb A1c MFr Bld: 6.5 % (ref 4.6–6.5)

## 2017-11-15 ENCOUNTER — Telehealth: Payer: Self-pay | Admitting: Urology

## 2017-11-15 ENCOUNTER — Ambulatory Visit: Payer: Medicare Other | Admitting: Family Medicine

## 2017-11-15 VITALS — BP 125/73 | HR 46 | Ht 63.0 in | Wt 176.7 lb

## 2017-11-15 DIAGNOSIS — N390 Urinary tract infection, site not specified: Secondary | ICD-10-CM

## 2017-11-15 LAB — MICROSCOPIC EXAMINATION

## 2017-11-15 LAB — URINALYSIS, COMPLETE
Bilirubin, UA: NEGATIVE
Glucose, UA: NEGATIVE
KETONES UA: NEGATIVE
NITRITE UA: NEGATIVE
Specific Gravity, UA: 1.025 (ref 1.005–1.030)
Urobilinogen, Ur: 0.2 mg/dL (ref 0.2–1.0)
pH, UA: 5 (ref 5.0–7.5)

## 2017-11-15 MED ORDER — NITROFURANTOIN MONOHYD MACRO 100 MG PO CAPS: 100.0000 mg | ORAL_CAPSULE | Freq: Two times a day (BID) | ORAL | 0 refills | Status: DC

## 2017-11-15 MED ORDER — CEFUROXIME AXETIL 250 MG PO TABS: 250.0000 mg | ORAL_TABLET | Freq: Two times a day (BID) | ORAL | 0 refills | Status: DC

## 2017-11-15 NOTE — Progress Notes (Signed)
Patient presents today with dysuria and lower abdominal pain. Symptoms started about 5 days ago. She has not had any abx or Urological surgeries in the last 30 days. She has been using OTC AZO for her symptoms. A urine was collected for UA, UCX. Per [employer] a RX was sent to pharmacy.

## 2017-11-15 NOTE — Telephone Encounter (Signed)
Patient called and stated that she has been burning with urination and is feeling bad  Added her to the nursre schedule to do a UA   Michelle

## 2017-11-18 LAB — CULTURE, URINE COMPREHENSIVE

## 2017-11-20 ENCOUNTER — Telehealth: Payer: Self-pay

## 2017-11-20 NOTE — Telephone Encounter (Signed)
Called both pt's cell and home numbers, no answer. Mychart message sent.

## 2017-11-20 NOTE — Telephone Encounter (Signed)
-----   Message from Abbie Sons, MD sent at 11/19/2017  7:15 AM EDT ----- Urine culture was positive and sensitive to the prescribed antibiotic.

## 2017-12-17 ENCOUNTER — Other Ambulatory Visit: Payer: Self-pay | Admitting: Internal Medicine

## 2017-12-17 NOTE — Telephone Encounter (Signed)
LOV  08/08/17 Dr. Derrel Nip Last refill 01/30/17// # 30 with 3 refills

## 2017-12-17 NOTE — Telephone Encounter (Signed)
Copied from [place] [phone]. Topic: Quick Communication - Rx Refill/Question >> Dec 17, 2017  8:58 AM Synthia Innocent wrote: Medication: LORazepam (ATIVAN) 0.5 MG tablet   Has the patient contacted their pharmacy? Yes.   (Agent: If no, request that the patient contact the pharmacy for the refill.) (Agent: If yes, when and what did the pharmacy advise?)  Preferred Pharmacy (with phone number or street name): [facility] [address]  Pearline Cables: Please be advised that RX refills may take up to 3 business days. We ask that you follow-up with your pharmacy.

## 2017-12-18 MED ORDER — LORAZEPAM 0.5 MG PO TABS: 0.2500 mg | ORAL_TABLET | Freq: Every day | ORAL | 2 refills | Status: DC

## 2017-12-18 NOTE — Telephone Encounter (Signed)
Appt scheduled for August.   Rx printed, signed and faxed.

## 2017-12-18 NOTE — Telephone Encounter (Signed)
Refilled: 01/30/2017 Last OV: 08/08/2017 Next OV: 02/07/2018

## 2017-12-18 NOTE — Telephone Encounter (Signed)
3 months total.  Needs 6 month follow up in August/Sept. pls schedule

## 2017-12-21 LAB — HM DIABETES EYE EXAM

## 2018-01-23 ENCOUNTER — Other Ambulatory Visit: Payer: Self-pay | Admitting: Internal Medicine

## 2018-02-07 ENCOUNTER — Encounter: Payer: Self-pay | Admitting: Internal Medicine

## 2018-02-07 ENCOUNTER — Ambulatory Visit (INDEPENDENT_AMBULATORY_CARE_PROVIDER_SITE_OTHER): Payer: Medicare Other | Admitting: Internal Medicine

## 2018-02-07 VITALS — BP 134/72 | HR 44 | Temp 98.1°F | Resp 15 | Ht 63.0 in | Wt 176.8 lb

## 2018-02-07 DIAGNOSIS — E1129 Type 2 diabetes mellitus with other diabetic kidney complication: Secondary | ICD-10-CM

## 2018-02-07 DIAGNOSIS — R809 Proteinuria, unspecified: Secondary | ICD-10-CM

## 2018-02-07 DIAGNOSIS — Z62890 Parent-child estrangement NEC: Secondary | ICD-10-CM

## 2018-02-07 DIAGNOSIS — Z23 Encounter for immunization: Secondary | ICD-10-CM | POA: Diagnosis not present

## 2018-02-07 DIAGNOSIS — E7800 Pure hypercholesterolemia, unspecified: Secondary | ICD-10-CM

## 2018-02-07 DIAGNOSIS — E538 Deficiency of other specified B group vitamins: Secondary | ICD-10-CM

## 2018-02-07 DIAGNOSIS — E559 Vitamin D deficiency, unspecified: Secondary | ICD-10-CM | POA: Diagnosis not present

## 2018-02-07 DIAGNOSIS — F5105 Insomnia due to other mental disorder: Secondary | ICD-10-CM

## 2018-02-07 DIAGNOSIS — I70219 Atherosclerosis of native arteries of extremities with intermittent claudication, unspecified extremity: Secondary | ICD-10-CM | POA: Diagnosis not present

## 2018-02-07 DIAGNOSIS — F419 Anxiety disorder, unspecified: Secondary | ICD-10-CM

## 2018-02-07 DIAGNOSIS — I10 Essential (primary) hypertension: Secondary | ICD-10-CM

## 2018-02-07 DIAGNOSIS — F411 Generalized anxiety disorder: Secondary | ICD-10-CM

## 2018-02-07 LAB — COMPREHENSIVE METABOLIC PANEL
ALBUMIN: 4.2 g/dL (ref 3.5–5.2)
ALT: 20 U/L (ref 0–35)
AST: 21 U/L (ref 0–37)
Alkaline Phosphatase: 65 U/L (ref 39–117)
BILIRUBIN TOTAL: 1.5 mg/dL — AB (ref 0.2–1.2)
BUN: 23 mg/dL (ref 6–23)
CO2: 31 meq/L (ref 19–32)
CREATININE: 0.9 mg/dL (ref 0.40–1.20)
Calcium: 9.7 mg/dL (ref 8.4–10.5)
Chloride: 103 mEq/L (ref 96–112)
GFR: 63.67 mL/min (ref >60.00)
Glucose, Bld: 102 mg/dL — ABNORMAL HIGH (ref 70–99)
Potassium: 3.7 mEq/L (ref 3.5–5.1)
Sodium: 141 mEq/L (ref 135–145)
Total Protein: 6.8 g/dL (ref 6.0–8.3)

## 2018-02-07 LAB — POCT GLYCOSYLATED HEMOGLOBIN (HGB A1C): Hemoglobin A1C: 6.3 % — AB (ref 4.0–5.6)

## 2018-02-07 LAB — VITAMIN D 25 HYDROXY (VIT D DEFICIENCY, FRACTURES): VITD: 30.96 ng/mL (ref 30.00–100.00)

## 2018-02-07 LAB — VITAMIN B12: Vitamin B-12: 446 pg/mL (ref 211–911)

## 2018-02-07 MED ORDER — ALPRAZOLAM 0.25 MG PO TABS: 0.2500 mg | ORAL_TABLET | Freq: Two times a day (BID) | ORAL | 1 refills | Status: DC | PRN

## 2018-02-07 NOTE — Progress Notes (Signed)
Subjective:  Patient ID: Linda [name], female    DOB: [birth year]  Age: 83 y.o. MRN: [id number]  CC: The primary encounter diagnosis was Controlled type 2 diabetes mellitus with microalbuminuria, without long-term current use of insulin ([place]). Diagnoses of B12 deficiency, Vitamin D deficiency, Need for 23-polyvalent pneumococcal polysaccharide vaccine, Parent-child estrangement nec, Generalized anxiety disorder, Insomnia secondary to anxiety, Pure hypercholesterolemia, and Essential hypertension were also pertinent to this visit.  HPI [facility] presents for follow up on chronic issues including type 2 DM, hyperlipidemia.   "I don't feel good."  Feels anxious ,  Stopped seeing the minister/boyfriend because  She felt he had too many responsibilities  .  Feels estranged from her daughter bc her daughter doesn't check in o her very often in spite living nearby.  Has tried confronting her onissues but daughter does not see interested in repairing the relationship .  Her daughterwas "Daaddy's girl."  Son is in [place] and checks in on her frequently.    6 month follow up on diabetes.  Patient has no complaints today.  Patient is following a low glycemic index diet and taking all prescribed medications regularly without side effects.  Fasting sugars have been under less than 140 most of the time and post prandials have been under 160 except on rare occasions. Patient is exercising about 3 times per week and intentionally trying to lose weight .  Patient has had an eye exam 2 weeks ago and checks her  feet regularly for signs of infection.  Patient does not walk barefoot outside,  And denies an numbness tingling or burning in feet. Patient is up to date on all recommended vaccinations.  eye exam 2 weeks ago  , some retinal issues  Worried about long term use of PPI for reflux   Takes lorazepam as needed for insomnia. Has not taken it in 2 weeks due to lingering sedation   Outpatient Medications  Prior to Visit  Medication Sig Dispense Refill  . Cholecalciferol (VITAMIN D) 2000 units tablet Take 2,000 Units by mouth daily.    Marland Kitchen COUMADIN 5 MG tablet Take as directed by coumadin clinic 90 tablet 1  . estradiol (ESTRACE) 0.1 MG/GM vaginal cream Place 1 g vaginally 3 (three) times a week. Pea sized amount per urethra 42.5 g 12  . feeding supplement (BOOST HIGH PROTEIN) LIQD Take 1 Container by mouth daily.    Marland Kitchen losartan (COZAAR) 50 MG tablet TAKE 2 TABLETS (100 MG TOTAL) BY MOUTH DAILY. 180 tablet 1  . metroNIDAZOLE (METROCREAM) 0.75 % cream APPLY TO FACE TWICE DAILY. WEAR SUNSCREEN EVERY AM.  5  . Omeprazole-Sodium Bicarbonate (ZEGERID PO) Take 1 tablet by mouth daily as needed.    Marland Kitchen LORazepam (ATIVAN) 0.5 MG tablet Take 0.5 tablets (0.25 mg total) by mouth at bedtime. If needed for insomnia 30 tablet 2  . amLODipine (NORVASC) 2.5 MG tablet     . Coenzyme Q10 (CO Q-10) 200 MG CAPS Take 1 Dose by mouth 2 (two) times daily.    . nitrofurantoin, macrocrystal-monohydrate, (MACROBID) 100 MG capsule Take 1 capsule (100 mg total) by mouth every 12 (twelve) hours. (Patient not taking: Reported on 02/07/2018) 14 capsule 0   No facility-administered medications prior to visit.     Review of Systems;  Patient denies headache, fevers, malaise, unintentional weight loss, skin rash, eye pain, sinus congestion and sinus pain, sore throat, dysphagia,  hemoptysis , cough, dyspnea, wheezing, chest pain, palpitations, orthopnea, edema, abdominal pain, nausea,  melena, diarrhea, constipation, flank pain, dysuria, hematuria, urinary  Frequency, nocturia, numbness, tingling, seizures,  Focal weakness, Loss of consciousness,  Tremor, insomnia, depression, anxiety, and suicidal ideation.      Objective:  BP 134/72 (BP Location: Right Arm, Patient Position: Sitting, Cuff Size: Normal)   Pulse (!) 44   Temp 98.1 F (36.7 C) (Oral)   Resp 15   Ht 5\' 3"  (1.6 m)   Wt 176 lb 12.8 oz (80.2 kg)   SpO2 94%   BMI  31.32 kg/m   BP Readings from Last 3 Encounters:  02/07/18 134/72  11/15/17 125/73  08/08/17 120/78    Wt Readings from Last 3 Encounters:  02/07/18 176 lb 12.8 oz (80.2 kg)  11/15/17 176 lb 11.2 oz (80.2 kg)  08/08/17 175 lb 6.4 oz (79.6 kg)    General appearance: alert, cooperative and appears stated age Ears: normal TM's and external ear canals both ears Throat: lips, mucosa, and tongue normal; teeth and gums normal Neck: no adenopathy, no carotid bruit, supple, symmetrical, trachea midline and thyroid not enlarged, symmetric, no tenderness/mass/nodules Back: symmetric, no curvature. ROM normal. No CVA tenderness. Lungs: clear to auscultation bilaterally Heart: regular rate and rhythm, S1, S2 normal, no murmur, click, rub or gallop Abdomen: soft, non-tender; bowel sounds normal; no masses,  no organomegaly Pulses: 2+ and symmetric Skin: Skin color, texture, turgor normal. No rashes or lesions Lymph nodes: Cervical, supraclavicular, and axillary nodes normal.  Lab Results  Component Value Date   HGBA1C 6.3 (A) 02/07/2018   HGBA1C 6.5 11/06/2017   HGBA1C 6.0 08/08/2017    Lab Results  Component Value Date   CREATININE 0.90 02/07/2018   CREATININE 0.75 11/06/2017   CREATININE 0.81 04/30/2017    Lab Results  Component Value Date   WBC 5.6 04/27/2016   HGB 15.8 (H) 04/27/2016   HCT 47.0 (H) 04/27/2016   PLT 188.0 04/27/2016   GLUCOSE 102 (H) 02/07/2018   CHOL 203 (H) 11/06/2017   TRIG 65.0 11/06/2017   HDL 69.30 11/06/2017   LDLDIRECT 90.0 10/23/2016   LDLCALC 120 (H) 11/06/2017   ALT 20 02/07/2018   AST 21 02/07/2018   NA 141 02/07/2018   K 3.7 02/07/2018   CL 103 02/07/2018   CREATININE 0.90 02/07/2018   BUN 23 02/07/2018   CO2 31 02/07/2018   TSH 1.48 04/27/2016   INR 3.4 (H) 10/03/2016   HGBA1C 6.3 (A) 02/07/2018   MICROALBUR 12.1 (H) 04/30/2017    Mm Diag Breast Tomo Uni Left  Result Date: 09/10/2017 CLINICAL DATA:  Possible distortion with a  few scattered calcifications in the upper-outer left breast on a recent 3D screening mammogram. EXAM: DIGITAL DIAGNOSTIC UNILATERAL LEFT MAMMOGRAM WITH CAD AND TOMO COMPARISON:  Previous exam(s). ACR Breast Density Category b: There are scattered areas of fibroglandular density. FINDINGS: 2D and 3D tomographic spot compression and 2D spot magnification views of the left breast demonstrate normal appearing fibroglandular tissue without distortion at the location of recently suspected distortion. There are a small number of scattered, tiny, punctate, rounded and oval, smoothly marginated calcifications in that region of the breast, unchanged compared to the patient's previous examinations dating back many years. Mammographic images were processed with CAD. IMPRESSION: 1. Mammographically stable benign left breast calcifications 2. The recently suspected left breast distortion was close apposition of normal breast tissue. 3. No evidence of malignancy. RECOMMENDATION: Bilateral screening mammogram in 1 year. I have discussed the findings and recommendations with the patient. Results were also provided in  writing at the conclusion of the visit. If applicable, a reminder letter will be sent to the patient regarding the next appointment. BI-RADS CATEGORY  2: Benign. Electronically Signed   By: Claudie Revering M.D.   On: 09/10/2017 10:31    Assessment & Plan:   Problem List Items Addressed This Visit    Hyperlipidemia    Untreated due to prior intolerance of statin therapy.   However, her LDL is at goal on diet alone.   Lab Results  Component Value Date   CHOL 203 (H) 11/06/2017   HDL 69.30 11/06/2017   LDLCALC 120 (H) 11/06/2017   LDLDIRECT 90.0 10/23/2016   TRIG 65.0 11/06/2017   CHOLHDL 3 11/06/2017         Essential hypertension    Well controlled for her age on losartan  And amlodipine. Renal function stable, no changes today.  Lab Results  Component Value Date   CREATININE 0.90 02/07/2018    Lab Results  Component Value Date   NA 141 02/07/2018   K 3.7 02/07/2018   CL 103 02/07/2018   CO2 31 02/07/2018         Insomnia secondary to anxiety    She has been avoiding lorazepam due to morning lethargy.  She is not sleeping well, and has tried all OTC alternatives without success. .  Trial of alprazolam       Relevant Medications   ALPRAZolam (XANAX) 0.25 MG tablet   Diabetes mellitus type II, controlled ([place]) - Primary    Well controlled on diet alone.    She  has microalbuminuria  And is already taking an ARB. She is intolerant of Crestor 5 mg every other day  due to leg pain .  Aspirin is relatively contraindicated since she is already taking coumadin .   Lab Results  Component Value Date   HGBA1C 6.3 (A) 02/07/2018   Lab Results  Component Value Date   MICROALBUR 12.1 (H) 04/30/2017         Relevant Orders   POCT HgB A1C (Completed)   Comprehensive metabolic panel (Completed)   Generalized anxiety disorder    She did not tolerate trial of Paxil and has deferred additional therapy      Relevant Medications   ALPRAZolam (XANAX) 0.25 MG tablet   Parent-child estrangement nec    A total of 40 minutes was spent with patient more than half of which was spent in counseling patient on  her need to make an attempt to resolve the estrangement  She has been feeling from her grown daughter and the separation she created from her boyfriend .       Other Visit Diagnoses    B12 deficiency       Relevant Orders   B12 (Completed)   Vitamin D deficiency       Relevant Orders   VITAMIN D 25 Hydroxy (Vit-D Deficiency, Fractures) (Completed)   Need for 23-polyvalent pneumococcal polysaccharide vaccine       Relevant Orders   Pneumococcal polysaccharide vaccine 23-valent greater than or equal to 2yo subcutaneous/IM (Completed)     .A total of 40 minutes was spent with patient more than half of which was spent in counseling patient on the above mentioned issues ,  reviewing and explaining recent labs and imaging studies done, and coordination of care.  I have discontinued Tichina M. Duchemin's Co Q-10, nitrofurantoin (macrocrystal-monohydrate), and LORazepam. I am also having her start on ALPRAZolam. Additionally, I am having her maintain her  COUMADIN, Vitamin D, feeding supplement, Omeprazole-Sodium Bicarbonate (ZEGERID PO), amLODipine, estradiol, metroNIDAZOLE, and losartan.  Meds ordered this encounter  Medications  . ALPRAZolam (XANAX) 0.25 MG tablet    Sig: Take 1 tablet (0.25 mg total) by mouth 2 (two) times daily as needed for anxiety.    Dispense:  30 tablet    Refill:  1    Medications Discontinued During This Encounter  Medication Reason  . Coenzyme Q10 (CO Q-10) 200 MG CAPS Patient has not taken in last 30 days  . nitrofurantoin, macrocrystal-monohydrate, (MACROBID) 100 MG capsule Patient has not taken in last 30 days  . LORazepam (ATIVAN) 0.5 MG tablet     Follow-up: Return in about 6 months (around 08/10/2018) for follow up diabetes.   Crecencio Mc, MD

## 2018-02-07 NOTE — Patient Instructions (Addendum)
  I appreciate your concern about continuing your PPI in light of the recently published studies suggesting an association with increased risk of dementia, osteoporosis  and kidney failure.  I advise you to try switching  From your PPI to either famotidine (generic pepcid)  20 mg once or twice daily,  or to  Ranitidine (Zantac) 150 mg once or twice daily.  These medications are  H2 blockers and are available without a prescriptions.   if your reflux symptoms are controlled,  You can Continue the daily h2 blocker. You can also alternate daily to give your body opportunites to absorb nutrients better    I am prescribing alprazolam instead of lorazepam to use as needed for insomnia

## 2018-02-09 DIAGNOSIS — Z62890 Parent-child estrangement NEC: Secondary | ICD-10-CM | POA: Insufficient documentation

## 2018-02-09 NOTE — Assessment & Plan Note (Signed)
She has been avoiding lorazepam due to morning lethargy.  She is not sleeping well, and has tried all OTC alternatives without success. .  Trial of alprazolam

## 2018-02-09 NOTE — Assessment & Plan Note (Signed)
A total of 40 minutes was spent with patient more than half of which was spent in counseling patient on  her need to make an attempt to resolve the estrangement  She has been feeling from her grown daughter and the separation she created from her boyfriend .

## 2018-02-09 NOTE — Assessment & Plan Note (Signed)
Untreated due to prior intolerance of statin therapy.   However, her LDL is at goal on diet alone.   Lab Results  Component Value Date   CHOL 203 (H) 11/06/2017   HDL 69.30 11/06/2017   LDLCALC 120 (H) 11/06/2017   LDLDIRECT 90.0 10/23/2016   TRIG 65.0 11/06/2017   CHOLHDL 3 11/06/2017

## 2018-02-09 NOTE — Assessment & Plan Note (Signed)
Well controlled for her age on losartan  And amlodipine. Renal function stable, no changes today.  Lab Results  Component Value Date   CREATININE 0.90 02/07/2018   Lab Results  Component Value Date   NA 141 02/07/2018   K 3.7 02/07/2018   CL 103 02/07/2018   CO2 31 02/07/2018

## 2018-02-09 NOTE — Assessment & Plan Note (Signed)
She did not tolerate trial of Paxil and has deferred additional therapy

## 2018-02-09 NOTE — Assessment & Plan Note (Signed)
Well controlled on diet alone.    She  has microalbuminuria  And is already taking an ARB. She is intolerant of Crestor 5 mg every other day  due to leg pain .  Aspirin is relatively contraindicated since she is already taking coumadin .   Lab Results  Component Value Date   HGBA1C 6.3 (A) 02/07/2018   Lab Results  Component Value Date   MICROALBUR 12.1 (H) 04/30/2017    

## 2018-02-26 ENCOUNTER — Ambulatory Visit: Payer: Self-pay | Admitting: *Deleted

## 2018-02-26 NOTE — Telephone Encounter (Signed)
Pt called with having experience dizziness while at the hairdresser. Her hairdresser put her head back to wash her hair and she became very dizzy like the room was spinning. This happened twice and she vomited. She denies having blurred vision, headache, or chest pain.  Her son in law picked her up and drove her home. She had been lying down but is still dizzy. She said that she has a hx of afib. Her blood pressure was checked and it was 156/108 after she got home. Rechecked now and it is 148/78 HR 60. She took a [place], that was suggested by her daughter. It has not helped much. She has taken in very little fluids and vomited twice. She is unable to walk without assistance.  She had vertigo once before and had a procedure done that helped, a long time ago. Per protocol pt should be assessed in the emergency room. Pt voiced understanding.  Reason for Disposition . [1] Drinking very little AND [2] dehydration suspected (e.g., no urine > 12 hours, very dry mouth, very lightheaded)  Answer Assessment - Initial Assessment Questions 1. DESCRIPTION: "Describe your dizziness."     dizzy 2. LIGHTHEADED: "Do you feel lightheaded?" (e.g., somewhat faint, woozy, weak upon standing)     yes 3. VERTIGO: "Do you feel like either you or the room is spinning or tilting?" (i.e. vertigo)     That was earlier 4. SEVERITY: "How bad is it?"  "Do you feel like you are going to faint?" "Can you stand and walk?"   - MILD - walking normally   - MODERATE - interferes with normal activities (e.g., work, school)    - SEVERE - unable to stand, requires support to walk, feels like passing out now.      moderate 5. ONSET:  "When did the dizziness begin?"     Today at the beauty parlor 6. AGGRAVATING FACTORS: "Does anything make it worse?" (e.g., standing, change in head position)     Standing and change in head position 7. HEART RATE: "Can you tell me your heart rate?" "How many beats in 15 seconds?"  (Note: not all  patients can do this)       Pt has afib 8. CAUSE: "What do you think is causing the dizziness?"     Not sure 9. RECURRENT SYMPTOM: "Have you had dizziness before?" If so, ask: "When was the last time?" "What happened that time?"     Has had vertigo before but been a long time 10. OTHER SYMPTOMS: "Do you have any other symptoms?" (e.g., fever, chest pain, vomiting, diarrhea, bleeding)       Nausea, vomiting, abd pain a while  Protocols used: DIZZINESS [facility]

## 2018-02-26 NOTE — Telephone Encounter (Signed)
Patient was triaged by [facility] nurse and advised to go ED.

## 2018-02-27 ENCOUNTER — Ambulatory Visit: Payer: Medicare Other

## 2018-03-14 ENCOUNTER — Ambulatory Visit: Payer: Medicare Other

## 2018-03-22 ENCOUNTER — Ambulatory Visit (INDEPENDENT_AMBULATORY_CARE_PROVIDER_SITE_OTHER): Payer: Medicare Other

## 2018-03-22 VITALS — BP 128/78 | HR 54 | Temp 98.6°F | Resp 15 | Ht 63.0 in | Wt 176.8 lb

## 2018-03-22 DIAGNOSIS — Z0000 Encounter for general adult medical examination without abnormal findings: Secondary | ICD-10-CM

## 2018-03-22 NOTE — Progress Notes (Signed)
Subjective:   Linda [name] is a 84 y.o. female who presents for Medicare Annual (Subsequent) preventive examination.  Review of Systems:  No ROS.  Medicare Wellness Visit. Additional risk factors are reflected in the social history. Cardiac Risk Factors include: advanced age (>67men, >18 women);diabetes mellitus;hypertension     Objective:     Vitals: BP 128/78 (BP Location: Left Arm, Patient Position: Sitting, Cuff Size: Normal)   Pulse (!) 54   Temp 98.6 F (37 C) (Oral)   Resp 15   Ht 5\' 3"  (1.6 m)   Wt 176 lb 12.8 oz (80.2 kg)   SpO2 96%   BMI 31.32 kg/m   Body mass index is 31.32 kg/m.  Advanced Directives 03/22/2018 02/26/2017 02/01/2016 07/26/2015 07/12/2015  Does Patient Have a Medical Advance Directive? Yes Yes Yes Yes Yes  Type of Paramedic of [place];Living will [place];Living will [place];Living will [place];Living will [place];Living will  Does patient want to make changes to medical advance directive? No - Patient declined No - Patient declined - No - Patient declined Yes - information given  Copy of [place] in Chart? Yes Yes Yes Yes Yes    Tobacco Social History   Tobacco Use  Smoking Status Never Smoker  Smokeless Tobacco Never Used     Counseling given: Not Answered   Clinical Intake:  Pre-visit preparation completed: Yes  Pain : No/denies pain     Nutritional Status: BMI > 30  Obese Diabetes: Yes(Followed by pcp)  How often do you need to have someone help you when you read instructions, pamphlets, or other written materials from your doctor or pharmacy?: 1 - Never  Interpreter Needed?: No     Past Medical History:  Diagnosis Date  . Anxiety   . Arrhythmia    paroxysmal atrial fibrillation  . Colon polyp   . Diverticulitis   . Hard of hearing   . Hyperlipidemia   . Kidney stone   . Parathyroid disease  ([place])    Parathyroidectomy   . PVC (premature ventricular contraction)   . Skin cancer   . UTI (lower urinary tract infection)    Past Surgical History:  Procedure Laterality Date  . ABDOMINAL HYSTERECTOMY    . CHOLECYSTECTOMY  2002  . LITHOTRIPSY    . PARATHYROIDECTOMY  2004   1 removed  . ROTATOR CUFF REPAIR  2002  . TONSILLECTOMY  1956  . TONSILLECTOMY    . TOTAL ABDOMINAL HYSTERECTOMY W/ BILATERAL SALPINGOOPHORECTOMY  1984  . VEIN LIGATION AND STRIPPING     Family History  Problem Relation Age of Onset  . Heart disease Brother   . Stroke Mother   . Skin cancer Father   . Breast cancer Paternal Grandmother    Social History   Socioeconomic History  . Marital status: Widowed    Spouse name: [name]  . Number of children: 2  . Years of education: 79  . Highest education level: Not on file  Occupational History  . Occupation: Product/process development scientist: RETIRED    Comment: Retired  Scientific laboratory technician  . Financial resource strain: Not hard at all  . Food insecurity:    Worry: Never true    Inability: Never true  . Transportation needs:    Medical: No    Non-medical: No  Tobacco Use  . Smoking status: Never Smoker  . Smokeless tobacco: Never Used  Substance  and Sexual Activity  . Alcohol use: No  . Drug use: No  . Sexual activity: Never  Lifestyle  . Physical activity:    Days per week: Not on file    Minutes per session: Not on file  . Stress: Not on file  Relationships  . Social connections:    Talks on phone: Not on file    Gets together: Not on file    Attends religious service: Not on file    Active member of club or organization: Not on file    Attends meetings of clubs or organizations: Not on file    Relationship status: Not on file  Other Topics Concern  . Not on file  Social History Narrative   Chassie is from [place] and [place] farm. Recently widowed. She was married to [place], her husband for 59 years. They have a daughter and a son. She  enjoys gardening.    Outpatient Encounter Medications as of 03/22/2018  Medication Sig  . ALPRAZolam (XANAX) 0.25 MG tablet Take 1 tablet (0.25 mg total) by mouth 2 (two) times daily as needed for anxiety.  Marland Kitchen amLODipine (NORVASC) 2.5 MG tablet   . Cholecalciferol (VITAMIN D) 2000 units tablet Take 2,000 Units by mouth daily.  Marland Kitchen COUMADIN 5 MG tablet Take as directed by coumadin clinic  . estradiol (ESTRACE) 0.1 MG/GM vaginal cream Place 1 g vaginally 3 (three) times a week. Pea sized amount per urethra  . feeding supplement (BOOST HIGH PROTEIN) LIQD Take 1 Container by mouth daily.  Marland Kitchen losartan (COZAAR) 50 MG tablet TAKE 2 TABLETS (100 MG TOTAL) BY MOUTH DAILY.  . metroNIDAZOLE (METROCREAM) 0.75 % cream APPLY TO FACE TWICE DAILY. WEAR SUNSCREEN EVERY AM.  . Omeprazole-Sodium Bicarbonate (ZEGERID PO) Take 1 tablet by mouth daily as needed.   No facility-administered encounter medications on file as of 03/22/2018.     Activities of Daily Living In your present state of health, do you have any difficulty performing the following activities: 03/22/2018  Hearing? Y  Vision? N  Difficulty concentrating or making decisions? Y  Comment Memory delay  Walking or climbing stairs? Y  Comment Difficulty walking long distances. Paces herself as needed.   Dressing or bathing? N  Doing errands, shopping? N  Preparing Food and eating ? N  Using the Toilet? N  In the past six months, have you accidently leaked urine? Y  Comment Managed with daily pad  Do you have problems with loss of bowel control? N  Managing your Medications? N  Managing your Finances? N  Housekeeping or managing your Housekeeping? N  Some recent data might be hidden    Patient Care Team: Crecencio Mc, MD as PCP - General (Internal Medicine)    Assessment:   This is a routine wellness examination for Kaileena.  The goal of the wellness visit is to assist the patient how to close the gaps in care and create a preventative care  plan for the patient.   The roster of all physicians providing medical care to patient is listed in the Snapshot section of the chart.  Taking calcium VIT D as appropriate/Osteoporosis risk reviewed.    Safety issues reviewed; Smoke and carbon monoxide detectors in the home. No firearms in the home. Wears seatbelts when driving or riding with others. No violence in the home.  They do not have excessive sun exposure.  Discussed the need for sun protection: hats, long sleeves and the use of sunscreen  if there is significant sun exposure.   Patient is alert, normal appearance, oriented to person/place/and time.  Correctly identified the president of the [place] and recalls of 2/3 words.   No failures at ADL's or IADL's.   BMI- discussed the importance of a healthy diet, water intake and the benefits of aerobic exercise. Educational material provided.   Dental- every 6 months.  Eye- Visual acuity not assessed per patient preference since they have regular follow up with the ophthalmologist.  Wears corrective lenses.  Sleep patterns- Sleeps ok at night. Naps during the day as needed.  Taking medication as directed.   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   Exercise Activities and Dietary recommendations Current Exercise Habits: Home exercise routine, Type of exercise: walking;treadmill;strength training/weights, Time (Minutes): 60, Frequency (Times/Week): 3, Weekly Exercise (Minutes/Week): 180, Intensity: Mild  Goals      Patient Stated   . Low Carb Diet (pt-stated)     Lose weight       Fall Risk Fall Risk  03/22/2018 02/26/2017 02/01/2016 01/20/2016 01/19/2015  Falls in the past year? No No No No No   Depression Screen PHQ 2/9 Scores 03/22/2018 02/26/2017 02/01/2016 01/20/2016  PHQ - 2 Score 0 0 0 0     Cognitive Function MMSE - Mini Mental State Exam 02/26/2017 02/01/2016  Orientation to time 5 5  Orientation to Place 5 5    Registration 3 3  Attention/ Calculation 5 5  Recall 3 3  Language- name 2 objects 2 2  Language- repeat 1 1  Language- follow 3 step command 3 3  Language- read & follow direction 1 1  Write a sentence 1 1  Copy design 1 1  Total score 30 30     6CIT Screen 03/22/2018  What Year? 0 points  What month? 0 points  What time? 0 points  Count back from 20 0 points  Months in reverse 0 points  Repeat phrase 0 points  Total Score 0    Immunization History  Administered Date(s) Administered  . Influenza-Unspecified 02/17/2013, 03/02/2014, 03/05/2015, 03/31/2016  . Pneumococcal Conjugate-13 03/04/2014  . Pneumococcal Polysaccharide-23 11/18/2010, 02/07/2018  . Td 11/17/2009   Screening Tests Health Maintenance  Topic Date Due  . FOOT EXAM  10/26/2017  . INFLUENZA VACCINE  01/17/2018  . HEMOGLOBIN A1C  08/10/2018  . MAMMOGRAM  08/28/2018  . OPHTHALMOLOGY EXAM  12/22/2018  . TETANUS/TDAP  03/27/2022  . DEXA SCAN  Completed  . PNA vac Low Risk Adult  Completed      Plan:    End of life planning; Advance aging; Advanced directives discussed. Copy of current HCPOA/Living Will on file.    I have personally reviewed and noted the following in the patient's chart:   . Medical and social history . Use of alcohol, tobacco or illicit drugs  . Current medications and supplements . Functional ability and status . Nutritional status . Physical activity . Advanced directives . List of other physicians . Hospitalizations, surgeries, and ER visits in previous 12 months . Vitals . Screenings to include cognitive, depression, and falls . Referrals and appointments  In addition, I have reviewed and discussed with patient certain preventive protocols, quality metrics, and best practice recommendations. A written personalized care plan for preventive services as well as general preventive health recommendations were provided to patient.     Varney Biles,  LPN  08/22/91

## 2018-03-22 NOTE — Patient Instructions (Addendum)
  Linda [name] , Thank you for taking time to come for your Medicare Wellness Visit. I appreciate your ongoing commitment to your health goals. Please review the following plan we discussed and let me know if I can assist you in the future.   These are the goals we discussed: Goals      Patient Stated   . Low Carb Diet (pt-stated)     Lose weight       This is a list of the screening recommended for you and due dates:  Health Maintenance  Topic Date Due  . Complete foot exam   10/26/2017  . Flu Shot  01/17/2018  . Hemoglobin A1C  08/10/2018  . Mammogram  08/28/2018  . Eye exam for diabetics  12/22/2018  . Tetanus Vaccine  03/27/2022  . DEXA scan (bone density measurement)  Completed  . Pneumonia vaccines  Completed

## 2018-03-31 NOTE — Progress Notes (Signed)
  I have reviewed the above information and agree with above.   Teresa Tullo, MD 

## 2018-04-19 DIAGNOSIS — G459 Transient cerebral ischemic attack, unspecified: Secondary | ICD-10-CM

## 2018-04-19 HISTORY — DX: Transient cerebral ischemic attack, unspecified: G45.9

## 2018-05-02 ENCOUNTER — Ambulatory Visit (INDEPENDENT_AMBULATORY_CARE_PROVIDER_SITE_OTHER): Payer: Medicare Other

## 2018-05-02 ENCOUNTER — Ambulatory Visit (INDEPENDENT_AMBULATORY_CARE_PROVIDER_SITE_OTHER): Payer: Medicare Other | Admitting: Internal Medicine

## 2018-05-02 ENCOUNTER — Encounter: Payer: Self-pay | Admitting: Internal Medicine

## 2018-05-02 ENCOUNTER — Telehealth: Payer: Self-pay | Admitting: Internal Medicine

## 2018-05-02 VITALS — BP 142/82 | HR 41 | Temp 97.8°F | Resp 15 | Ht 63.0 in | Wt 176.0 lb

## 2018-05-02 DIAGNOSIS — K209 Esophagitis, unspecified without bleeding: Secondary | ICD-10-CM

## 2018-05-02 DIAGNOSIS — Z8719 Personal history of other diseases of the digestive system: Secondary | ICD-10-CM

## 2018-05-02 DIAGNOSIS — H81399 Other peripheral vertigo, unspecified ear: Secondary | ICD-10-CM

## 2018-05-02 DIAGNOSIS — R809 Proteinuria, unspecified: Secondary | ICD-10-CM

## 2018-05-02 DIAGNOSIS — R918 Other nonspecific abnormal finding of lung field: Secondary | ICD-10-CM

## 2018-05-02 DIAGNOSIS — E1129 Type 2 diabetes mellitus with other diabetic kidney complication: Secondary | ICD-10-CM

## 2018-05-02 DIAGNOSIS — E7800 Pure hypercholesterolemia, unspecified: Secondary | ICD-10-CM | POA: Diagnosis not present

## 2018-05-02 DIAGNOSIS — D751 Secondary polycythemia: Secondary | ICD-10-CM

## 2018-05-02 DIAGNOSIS — R61 Generalized hyperhidrosis: Secondary | ICD-10-CM | POA: Diagnosis not present

## 2018-05-02 DIAGNOSIS — H538 Other visual disturbances: Secondary | ICD-10-CM

## 2018-05-02 DIAGNOSIS — R490 Dysphonia: Secondary | ICD-10-CM

## 2018-05-02 DIAGNOSIS — R1012 Left upper quadrant pain: Secondary | ICD-10-CM

## 2018-05-02 DIAGNOSIS — R42 Dizziness and giddiness: Secondary | ICD-10-CM

## 2018-05-02 DIAGNOSIS — I70219 Atherosclerosis of native arteries of extremities with intermittent claudication, unspecified extremity: Secondary | ICD-10-CM

## 2018-05-02 MED ORDER — ESCITALOPRAM OXALATE 10 MG PO TABS: 10.0000 mg | ORAL_TABLET | Freq: Every day | ORAL | 2 refills | Status: DC

## 2018-05-02 NOTE — Progress Notes (Signed)
Subjective:  Patient ID: Linda [name], female    DOB: [birth year]  Age: 84 y.o. MRN: [id number]  CC: The primary encounter diagnosis was Night sweats. Diagnoses of Pure hypercholesterolemia, Controlled type 2 diabetes mellitus with microalbuminuria, without long-term current use of insulin ([place]), Mass of right lung, Secondary erythrocytosis, Hoarseness of voice, Vertigo, LUQ pain, History of epigastric pain, Esophagitis, and Mass of middle lobe of right lung were also pertinent to this visit.  HPI Linda [name] presents for follow up on type 2 DM,  Etc but has multliple concerns that  are subacute in nature. Marland Kitchen  1)  Constant hoarseness . Has had ENT evaluation for same that failed to reveal a source. No prior EGD,   2)  having recurrent abdominal pain on the left side when her stomach is empty,  Despite using omeprazole  Daily at 4 pm 30 mintues prior to eating.  Describing a  a burning sensation.  In upper abdomen,  Also has coughing fits at night  Sleeps with the head of her bed elevated. .   Wants to see Linda [name]. No unintentional weigh loss.    3) recurrent dizziness with head tilt backward . Chronic but getting worse.  Recent episode of "inner ear"  With vertigo.   Saw ENT  And treated with the Eply maneuver which helped transiently,  Then symptoms returned and maneuver was repeated on other side.   Resolved  But now is recurring with extension of neck or turns head quickly.     Needs open MRI in [place]   4) having night sweats that drench her pajamas and hiar, but has had  None in a few weeks   5) adding "hemp hearts" to her smoothie    Checks INR weekly which dropped from 3.1 to 2.4  .  Puts Kale in smoothies  6) anxiety:  now feels she is depressed.  Wants more social outlets  Wants to hire  a companion to travel with. Does not feel suicidal.     Outpatient Medications Prior to Visit  Medication Sig Dispense Refill  . ALPRAZolam (XANAX) 0.25 MG tablet Take 1 tablet (0.25 mg total) by  mouth 2 (two) times daily as needed for anxiety. 30 tablet 1  . Cholecalciferol (VITAMIN D) 2000 units tablet Take 2,000 Units by mouth daily.    Marland Kitchen COUMADIN 5 MG tablet Take as directed by coumadin clinic 90 tablet 1  . estradiol (ESTRACE) 0.1 MG/GM vaginal cream Place 1 g vaginally 3 (three) times a week. Pea sized amount per urethra 42.5 g 12  . feeding supplement (BOOST HIGH PROTEIN) LIQD Take 1 Container by mouth daily.    Marland Kitchen losartan (COZAAR) 50 MG tablet TAKE 2 TABLETS (100 MG TOTAL) BY MOUTH DAILY. 180 tablet 1  . metroNIDAZOLE (METROCREAM) 0.75 % cream APPLY TO FACE TWICE DAILY. WEAR SUNSCREEN EVERY AM.  5  . Omeprazole-Sodium Bicarbonate (ZEGERID PO) Take 1 tablet by mouth daily as needed.    . ondansetron (ZOFRAN) 4 MG tablet Take 4 mg by mouth every 8 (eight) hours as needed. for nausea  0  . amLODipine (NORVASC) 2.5 MG tablet      No facility-administered medications prior to visit.     Review of Systems;  Patient denies headache, fevers, malaise, unintentional weight loss, skin rash, eye pain, sinus congestion and sinus pain, sore throat, dysphagia,  hemoptysis , cough, dyspnea, wheezing, chest pain, palpitations, orthopnea, edema, abdominal pain, nausea, melena, diarrhea, constipation, flank pain, dysuria, hematuria,  urinary  Frequency, nocturia, numbness, tingling, seizures,  Focal weakness, Loss of consciousness,  Tremor, insomnia, depression, anxiety, and suicidal ideation.      Objective:  BP (!) 142/82 (BP Location: Left Arm, Patient Position: Sitting, Cuff Size: Normal)   Pulse (!) 41   Temp 97.8 F (36.6 C) (Oral)   Resp 15   Ht 5\' 3"  (1.6 m)   Wt 176 lb (79.8 kg)   SpO2 97%   BMI 31.18 kg/m   BP Readings from Last 3 Encounters:  05/02/18 (!) 142/82  03/22/18 128/78  02/07/18 134/72    Wt Readings from Last 3 Encounters:  05/02/18 176 lb (79.8 kg)  03/22/18 176 lb 12.8 oz (80.2 kg)  02/07/18 176 lb 12.8 oz (80.2 kg)    General appearance: alert,  cooperative and appears stated age Ears: normal TM's and external ear canals both ears Throat: lips, mucosa, and tongue normal; teeth and gums normal Neck: no adenopathy, no carotid bruit, supple, symmetrical, trachea midline and thyroid not enlarged, symmetric, no tenderness/mass/nodules Back: symmetric, no curvature. ROM normal. No CVA tenderness. Lungs: clear to auscultation bilaterally Heart: regular rate and rhythm, S1, S2 normal, no murmur, click, rub or gallop Abdomen: soft, non-tender; bowel sounds normal; no masses,  no organomegaly Pulses: 2+ and symmetric Skin: Skin color, texture, turgor normal. No rashes or lesions Lymph nodes: Cervical, supraclavicular, and axillary nodes normal.  Lab Results  Component Value Date   HGBA1C 6.3 (A) 02/07/2018   HGBA1C 6.5 11/06/2017   HGBA1C 6.0 08/08/2017    Lab Results  Component Value Date   CREATININE 0.90 02/07/2018   CREATININE 0.75 11/06/2017   CREATININE 0.81 04/30/2017    Lab Results  Component Value Date   WBC 5.6 04/27/2016   HGB 15.8 (H) 04/27/2016   HCT 47.0 (H) 04/27/2016   PLT 188.0 04/27/2016   GLUCOSE 102 (H) 02/07/2018   CHOL 203 (H) 11/06/2017   TRIG 65.0 11/06/2017   HDL 69.30 11/06/2017   LDLDIRECT 90.0 10/23/2016   LDLCALC 120 (H) 11/06/2017   ALT 20 02/07/2018   AST 21 02/07/2018   NA 141 02/07/2018   K 3.7 02/07/2018   CL 103 02/07/2018   CREATININE 0.90 02/07/2018   BUN 23 02/07/2018   CO2 31 02/07/2018   TSH 1.48 04/27/2016   INR 3.4 (H) 10/03/2016   HGBA1C 6.3 (A) 02/07/2018   MICROALBUR 12.1 (H) 04/30/2017    Mm Diag Breast Tomo Uni Left  Result Date: 09/10/2017 CLINICAL DATA:  Possible distortion with a few scattered calcifications in the upper-outer left breast on a recent 3D screening mammogram. EXAM: DIGITAL DIAGNOSTIC UNILATERAL LEFT MAMMOGRAM WITH CAD AND TOMO COMPARISON:  Previous exam(s). ACR Breast Density Category b: There are scattered areas of fibroglandular density. FINDINGS:  2D and 3D tomographic spot compression and 2D spot magnification views of the left breast demonstrate normal appearing fibroglandular tissue without distortion at the location of recently suspected distortion. There are a small number of scattered, tiny, punctate, rounded and oval, smoothly marginated calcifications in that region of the breast, unchanged compared to the patient's previous examinations dating back many years. Mammographic images were processed with CAD. IMPRESSION: 1. Mammographically stable benign left breast calcifications 2. The recently suspected left breast distortion was close apposition of normal breast tissue. 3. No evidence of malignancy. RECOMMENDATION: Bilateral screening mammogram in 1 year. I have discussed the findings and recommendations with the patient. Results were also provided in writing at the conclusion of the visit. If applicable,  a reminder letter will be sent to the patient regarding the next appointment. BI-RADS CATEGORY  2: Benign. Electronically Signed   By: Claudie Revering M.D.   On: 09/10/2017 10:31    Assessment & Plan:   Problem List Items Addressed This Visit    Diabetes mellitus type II, controlled ([place])    Well controlled on diet alone.    She  has microalbuminuria  And is already taking an ARB. She is intolerant of Crestor 5 mg every other day  due to leg pain .  Aspirin is relatively contraindicated since she is already taking coumadin .   Lab Results  Component Value Date   HGBA1C 6.3 (A) 02/07/2018   Lab Results  Component Value Date   MICROALBUR 12.1 (H) 04/30/2017         Relevant Orders   Comprehensive metabolic panel   Hemoglobin A1c   Hoarseness of voice     Chronic  . With prior ENT evaluation unrevealing.  Given her concurrent epigastric pain , esophagitis and Left upper quadrant  Pain when fasting.  GI referral made  To rule out gastric ulcer and Barrett's esopaghus      Relevant Orders   Ambulatory referral to Gastroenterology    Hyperlipidemia   Relevant Orders   Lipid panel   Mass of middle lobe of right lung    Found on chest x ray done today to investigate report of night sweats .  CT chest ordered.       Secondary erythrocytosis    NO further workup per hematology workup In  2017        Other Visit Diagnoses    Night sweats    -  Primary   Relevant Orders   TSH   CBC with Differential/Platelet   DG Chest 2 View (Completed)   CT Chest Wo Contrast   Mass of right lung       Relevant Orders   CT Chest Wo Contrast   Vertigo       Relevant Orders   VAS US CAROTID   LUQ pain       Relevant Orders   Ambulatory referral to Gastroenterology   History of epigastric pain       Relevant Orders   Ambulatory referral to Gastroenterology   Esophagitis       Relevant Orders   Ambulatory referral to Gastroenterology    A total of 40 minutes was spent with patient more than half of which was spent in counseling patient on the above mentioned issues , reviewing and explaining recent labs and imaging studies done, and coordination of care.   I have discontinued Lilyona M. Selvage's amLODipine. I am also having her start on escitalopram. Additionally, I am having her maintain her COUMADIN, Vitamin D, feeding supplement, Omeprazole-Sodium Bicarbonate (ZEGERID PO), estradiol, metroNIDAZOLE, losartan, ALPRAZolam, and ondansetron.  Meds ordered this encounter  Medications  . escitalopram (LEXAPRO) 10 MG tablet    Sig: Take 1 tablet (10 mg total) by mouth daily.    Dispense:  30 tablet    Refill:  2    Medications Discontinued During This Encounter  Medication Reason  . amLODipine (NORVASC) 2.5 MG tablet Error    Follow-up: Return in about 4 weeks (around 05/30/2018) for follow up diabetes, depression .   Crecencio Mc, MD

## 2018-05-02 NOTE — Patient Instructions (Addendum)
1) Try taking 25 mg benadryl at bedtime to treat  Insomnia Dry eye    2) [facility]   [facility]   3) MRI brain to evaluate recurrent vertigo and blurred vision  4) fo ryour anxiety/depression:  Please start the Lexapro (escitalopram) at 1/2 tablet daily in the evening for the first few days to avoid nausea.  You can increase to a full tablet after 4 days if you havenot developed side effects of nausea.  If the lexapro interferes with your sleep, take it in the morning instead  Please return in  4 weeks

## 2018-05-02 NOTE — Telephone Encounter (Signed)
Olivia Mackie from [facility] called CXR results Will route to office

## 2018-05-03 ENCOUNTER — Other Ambulatory Visit: Payer: Self-pay | Admitting: Internal Medicine

## 2018-05-03 DIAGNOSIS — R918 Other nonspecific abnormal finding of lung field: Secondary | ICD-10-CM | POA: Insufficient documentation

## 2018-05-03 MED ORDER — DOXYCYCLINE HYCLATE 100 MG PO TABS: 100.0000 mg | ORAL_TABLET | Freq: Two times a day (BID) | ORAL | 0 refills | Status: DC

## 2018-05-03 MED ORDER — PREDNISONE 10 MG PO TABS: ORAL_TABLET | ORAL | 0 refills | Status: DC

## 2018-05-03 NOTE — Assessment & Plan Note (Signed)
Found on chest x ray done today to investigate report of night sweats .  CT chest ordered.

## 2018-05-03 NOTE — Assessment & Plan Note (Addendum)
Chronic  . With prior ENT evaluation unrevealing.  Given her concurrent epigastric pain , esophagitis and Left upper quadrant  Pain when fasting.  GI referral made  To rule out gastric ulcer and Barrett's esopaghus

## 2018-05-03 NOTE — Progress Notes (Signed)
SEE MYCHART MESSAGE TO PATIENT   MEDS SENT TO [facility] [address] .  NEEDS TO SKIP TWO DOSES OF COUMADIN ONDAYS 3 AND 4 OF ANTIBIOTICS TO AVOID HER BLOOD GETTING TOO THIN. RESUME COUMADIN  ON DAY 5

## 2018-05-03 NOTE — Telephone Encounter (Signed)
Please advise 

## 2018-05-03 NOTE — Assessment & Plan Note (Signed)
Well controlled on diet alone.    She  has microalbuminuria  And is already taking an ARB. She is intolerant of Crestor 5 mg every other day  due to leg pain .  Aspirin is relatively contraindicated since she is already taking coumadin .   Lab Results  Component Value Date   HGBA1C 6.3 (A) 02/07/2018   Lab Results  Component Value Date   MICROALBUR 12.1 (H) 04/30/2017

## 2018-05-03 NOTE — Telephone Encounter (Signed)
Will you please review the chest xray that was performed yesterday.

## 2018-05-03 NOTE — Assessment & Plan Note (Signed)
NO further workup per hematology workup In  2017

## 2018-05-04 NOTE — Telephone Encounter (Signed)
Dr Derrel Nip has already notified pt of cxr results with plan for CT chest.  See my chart message.

## 2018-05-07 ENCOUNTER — Other Ambulatory Visit: Payer: Self-pay | Admitting: Internal Medicine

## 2018-05-07 NOTE — Telephone Encounter (Signed)
I had her CT scheduled for tomorrow (Nov. 20) but she said that she did not want it because it was no big rush. She has been rescheduled for Dec. 3 at 11:00 and is aware. Thanks [employer]

## 2018-05-08 ENCOUNTER — Other Ambulatory Visit: Payer: Self-pay

## 2018-05-08 ENCOUNTER — Ambulatory Visit: Payer: Medicare Other

## 2018-05-10 ENCOUNTER — Inpatient Hospital Stay
Admission: EM | Admit: 2018-05-10 | Discharge: 2018-05-11 | DRG: 065 | Disposition: A | Discharge status: Home / Self Care | Payer: Medicare Other | Attending: Family Medicine | Admitting: Family Medicine

## 2018-05-10 ENCOUNTER — Emergency Department: Payer: Medicare Other

## 2018-05-10 ENCOUNTER — Other Ambulatory Visit: Payer: Self-pay

## 2018-05-10 ENCOUNTER — Inpatient Hospital Stay: Payer: Medicare Other

## 2018-05-10 ENCOUNTER — Encounter: Payer: Self-pay | Admitting: Emergency Medicine

## 2018-05-10 DIAGNOSIS — H9190 Unspecified hearing loss, unspecified ear: Secondary | ICD-10-CM | POA: Diagnosis present

## 2018-05-10 DIAGNOSIS — Z880 Allergy status to penicillin: Secondary | ICD-10-CM | POA: Diagnosis not present

## 2018-05-10 DIAGNOSIS — Z882 Allergy status to sulfonamides status: Secondary | ICD-10-CM | POA: Diagnosis not present

## 2018-05-10 DIAGNOSIS — E119 Type 2 diabetes mellitus without complications: Secondary | ICD-10-CM | POA: Diagnosis present

## 2018-05-10 DIAGNOSIS — R911 Solitary pulmonary nodule: Secondary | ICD-10-CM | POA: Diagnosis present

## 2018-05-10 DIAGNOSIS — G459 Transient cerebral ischemic attack, unspecified: Secondary | ICD-10-CM

## 2018-05-10 DIAGNOSIS — Z79818 Long term (current) use of other agents affecting estrogen receptors and estrogen levels: Secondary | ICD-10-CM

## 2018-05-10 DIAGNOSIS — Z66 Do not resuscitate: Secondary | ICD-10-CM | POA: Diagnosis present

## 2018-05-10 DIAGNOSIS — Z8673 Personal history of transient ischemic attack (TIA), and cerebral infarction without residual deficits: Secondary | ICD-10-CM | POA: Diagnosis present

## 2018-05-10 DIAGNOSIS — F419 Anxiety disorder, unspecified: Secondary | ICD-10-CM | POA: Diagnosis present

## 2018-05-10 DIAGNOSIS — Z85828 Personal history of other malignant neoplasm of skin: Secondary | ICD-10-CM

## 2018-05-10 DIAGNOSIS — Z79899 Other long term (current) drug therapy: Secondary | ICD-10-CM | POA: Diagnosis not present

## 2018-05-10 DIAGNOSIS — H5462 Unqualified visual loss, left eye, normal vision right eye: Secondary | ICD-10-CM | POA: Diagnosis present

## 2018-05-10 DIAGNOSIS — I10 Essential (primary) hypertension: Secondary | ICD-10-CM | POA: Diagnosis present

## 2018-05-10 DIAGNOSIS — I639 Cerebral infarction, unspecified: Secondary | ICD-10-CM | POA: Diagnosis present

## 2018-05-10 DIAGNOSIS — Z7901 Long term (current) use of anticoagulants: Secondary | ICD-10-CM | POA: Diagnosis not present

## 2018-05-10 DIAGNOSIS — Z888 Allergy status to other drugs, medicaments and biological substances status: Secondary | ICD-10-CM | POA: Diagnosis not present

## 2018-05-10 DIAGNOSIS — I482 Chronic atrial fibrillation, unspecified: Secondary | ICD-10-CM | POA: Diagnosis present

## 2018-05-10 DIAGNOSIS — E785 Hyperlipidemia, unspecified: Secondary | ICD-10-CM | POA: Diagnosis present

## 2018-05-10 DIAGNOSIS — I428 Other cardiomyopathies: Secondary | ICD-10-CM | POA: Diagnosis present

## 2018-05-10 LAB — PROTIME-INR
INR: 1.65
Prothrombin Time: 19.3 seconds — ABNORMAL HIGH (ref 11.4–15.2)

## 2018-05-10 LAB — CBC
HCT: 48.3 % — ABNORMAL HIGH (ref 36.0–46.0)
Hemoglobin: 16 g/dL — ABNORMAL HIGH (ref 12.0–15.0)
MCH: 31.6 pg (ref 26.0–34.0)
MCHC: 33.1 g/dL (ref 30.0–36.0)
MCV: 95.5 fL (ref 80.0–100.0)
Platelets: 197 10*3/uL (ref 150–400)
RBC: 5.06 MIL/uL (ref 3.87–5.11)
RDW: 13.4 % (ref 11.5–15.5)
WBC: 6.8 10*3/uL (ref 4.0–10.5)
nRBC: 0 % (ref 0.0–0.2)

## 2018-05-10 LAB — DIFFERENTIAL
Abs Immature Granulocytes: 0.05 10*3/uL (ref 0.00–0.07)
BASOS PCT: 0 %
Basophils Absolute: 0 10*3/uL (ref 0.0–0.1)
Eosinophils Absolute: 0 10*3/uL (ref 0.0–0.5)
Eosinophils Relative: 0 %
Immature Granulocytes: 1 %
Lymphocytes Relative: 15 %
Lymphs Abs: 1 10*3/uL (ref 0.7–4.0)
MONO ABS: 0.4 10*3/uL (ref 0.1–1.0)
MONOS PCT: 6 %
NEUTROS PCT: 78 %
Neutro Abs: 5.3 10*3/uL (ref 1.7–7.7)

## 2018-05-10 LAB — COMPREHENSIVE METABOLIC PANEL
ALT: 27 U/L (ref 0–44)
ANION GAP: 12 (ref 5–15)
AST: 29 U/L (ref 15–41)
Albumin: 3.8 g/dL (ref 3.5–5.0)
Alkaline Phosphatase: 58 U/L (ref 38–126)
BUN: 25 mg/dL — ABNORMAL HIGH (ref 8–23)
CHLORIDE: 100 mmol/L (ref 98–111)
CO2: 27 mmol/L (ref 22–32)
Calcium: 9.3 mg/dL (ref 8.9–10.3)
Creatinine, Ser: 0.8 mg/dL (ref 0.44–1.00)
Glucose, Bld: 182 mg/dL — ABNORMAL HIGH (ref 70–99)
POTASSIUM: 3.7 mmol/L (ref 3.5–5.1)
Sodium: 139 mmol/L (ref 135–145)
Total Bilirubin: 1.5 mg/dL — ABNORMAL HIGH (ref 0.3–1.2)
Total Protein: 6.5 g/dL (ref 6.5–8.1)

## 2018-05-10 LAB — APTT: APTT: 29 s (ref 24–36)

## 2018-05-10 LAB — GLUCOSE, CAPILLARY: GLUCOSE-CAPILLARY: 159 mg/dL — AB (ref 70–99)

## 2018-05-10 LAB — TROPONIN I

## 2018-05-10 MED ORDER — ASPIRIN 325 MG PO TABS
325.0000 mg | ORAL_TABLET | Freq: Every day | ORAL | Status: DC
  Filled 2018-05-10 (×2): qty 1

## 2018-05-10 MED ORDER — WARFARIN SODIUM 2.5 MG PO TABS
2.5000 mg | ORAL_TABLET | Freq: Once | ORAL | Status: DC
  Filled 2018-05-10: qty 1

## 2018-05-10 MED ORDER — VITAMIN D 25 MCG (1000 UNIT) PO TABS
2000.0000 [IU] | ORAL_TABLET | Freq: Every day | ORAL | Status: DC
  Administered 2018-05-11: 2000 [IU] via ORAL
  Filled 2018-05-10: qty 2

## 2018-05-10 MED ORDER — ASPIRIN 300 MG RE SUPP: 300.0000 mg | Freq: Every day | RECTAL | Status: DC

## 2018-05-10 MED ORDER — ESCITALOPRAM OXALATE 10 MG PO TABS
10.0000 mg | ORAL_TABLET | Freq: Every day | ORAL | Status: DC
  Filled 2018-05-10: qty 1

## 2018-05-10 MED ORDER — PRAVASTATIN SODIUM 20 MG PO TABS: 20.0000 mg | ORAL_TABLET | Freq: Every day | ORAL | Status: DC

## 2018-05-10 MED ORDER — ALPRAZOLAM 0.5 MG PO TABS: 0.2500 mg | ORAL_TABLET | Freq: Two times a day (BID) | ORAL | Status: DC | PRN

## 2018-05-10 MED ORDER — LOSARTAN POTASSIUM 50 MG PO TABS: 100.0000 mg | ORAL_TABLET | Freq: Every day | ORAL | Status: DC

## 2018-05-10 MED ORDER — BOOST HIGH PROTEIN PO LIQD
1.0000 | ORAL | Status: DC
  Filled 2018-05-10: qty 237

## 2018-05-10 MED ORDER — ROSUVASTATIN CALCIUM 10 MG PO TABS: 10.0000 mg | ORAL_TABLET | Freq: Every day | ORAL | Status: DC

## 2018-05-10 MED ORDER — STROKE: EARLY STAGES OF RECOVERY BOOK
Freq: Once | Status: AC
  Administered 2018-05-10: 18:00:00

## 2018-05-10 MED ORDER — ACETAMINOPHEN 650 MG RE SUPP: 650.0000 mg | RECTAL | Status: DC | PRN

## 2018-05-10 MED ORDER — WARFARIN - PHARMACIST DOSING INPATIENT
Freq: Every day | Status: DC
  Filled 2018-05-10: qty 1

## 2018-05-10 MED ORDER — ACETAMINOPHEN 325 MG PO TABS: 650.0000 mg | ORAL_TABLET | ORAL | Status: DC | PRN

## 2018-05-10 MED ORDER — LORAZEPAM 2 MG/ML IJ SOLN
1.0000 mg | Freq: Once | INTRAMUSCULAR | Status: AC
  Administered 2018-05-10: 22:00:00 1 mg via INTRAVENOUS
  Filled 2018-05-10: qty 1

## 2018-05-10 MED ORDER — IOHEXOL 350 MG/ML SOLN
75.0000 mL | Freq: Once | INTRAVENOUS | Status: AC | PRN
  Administered 2018-05-10: 75 mL via INTRAVENOUS

## 2018-05-10 MED ORDER — ACETAMINOPHEN 160 MG/5ML PO SOLN
650.0000 mg | ORAL | Status: DC | PRN
  Filled 2018-05-10: qty 20.3

## 2018-05-10 NOTE — Consult Note (Signed)
ANTICOAGULATION CONSULT NOTE - Initial Consult  Pharmacy Consult for warfarin Indication: atrial fibrillation  Allergies  Allergen Reactions  . Crestor [Rosuvastatin Calcium] Other (See Comments)    Myalgia   . Penicillins Rash  . Sulfa Antibiotics Rash  . Sulfonamide Derivatives Rash    Patient Measurements: Height: 5\' 3"  (160 cm) Weight: 174 lb (78.9 kg) IBW/kg (Calculated) : 52.4 Heparin Dosing Weight:   Vital Signs: Temp: 97.7 F (36.5 C) (11/22 1421) Temp Source: Oral (11/22 1421) BP: 145/69 (11/22 1421) Pulse Rate: 74 (11/22 1421)  Labs: Recent Labs    05/10/18 1441  HGB 16.0*  HCT 48.3*  PLT 197  APTT 29  LABPROT 19.3*  INR 1.65  CREATININE 0.80  TROPONINI <0.03    Estimated Creatinine Clearance: 53.9 mL/min (by C-G formula based on SCr of 0.8 mg/dL).   Medical History: Past Medical History:  Diagnosis Date  . Anxiety   . Arrhythmia    paroxysmal atrial fibrillation  . Colon polyp   . Diverticulitis   . Hard of hearing   . Hyperlipidemia   . Kidney stone   . Parathyroid disease ([place])    Parathyroidectomy   . PVC (premature ventricular contraction)   . Skin cancer   . UTI (lower urinary tract infection)     Medications:  Scheduled:   Assessment: Patient is a 84 year old female w/ PMH of afib on warfarin at home. Pt presents today with sudden loss of vision in left eye for 5 min. she was recently started on Doxycycline for lung nodule found on chest x-ray on and had 3 days lefts on antibiotic. Patient state that her PCP Dr. Derrel Nip advised her to hold Warfarin for 3 days and restart on on 05/09/2018 at 5 mg. She checks her INR at home and noted that it was 1.6 so she called her cardiologist today for guidance.  Repeat INR in ED today was 1.65. Patient has taken her dose today already (5mg ). Home dose is 5mg  daily expect 2.5mg  on Wed.   Goal of Therapy:  INR 2-3 Monitor platelets by anticoagulation protocol: Yes   Plan:  Patient already  took her dose today. However, I am concerned that with an unnecessary 3 day hold of warfarin, patient is going to continue to drop. I will give the patient a one time boost with an extra 2.5mg  tonight (7.5mg  total) and then resume home dose tomorrow.   Ramond Dial, Pharm.D, BCPS Clinical Pharmacist 05/10/2018,4:44 PM

## 2018-05-10 NOTE — H&P (Signed)
[facility] NAME: Linda [name]    MR#:  [id number]  DATE OF BIRTH:  02-01-1934  DATE OF ADMISSION:  05/10/2018  PRIMARY CARE PHYSICIAN: Crecencio Mc, MD   REQUESTING/REFERRING PHYSICIAN: dr Cinda Quest  CHIEF COMPLAINT:   Vision loss left eye HISTORY OF PRESENT ILLNESS:  Linda [name]  is a 84 y.o. female with a known history of diabetes, cardiomyopathy and atrial fibrillation on Coumadin who presented to the ER due to sudden loss of vision in left eye.  Patient reports that while she was driving home approximately noon she lost vision of her left eye.  She reports this lasted approximately 15 minutes. She was evaluated by neurology while in the emergency room. CT of the head and CTA head neck are essentially unremarkable. Initial NIH is 0.   PAST MEDICAL HISTORY:   Past Medical History:  Diagnosis Date  . Anxiety   . Arrhythmia    paroxysmal atrial fibrillation  . Colon polyp   . Diverticulitis   . Hard of hearing   . Hyperlipidemia   . Kidney stone   . Parathyroid disease ([place])    Parathyroidectomy   . PVC (premature ventricular contraction)   . Skin cancer   . UTI (lower urinary tract infection)     PAST SURGICAL HISTORY:   Past Surgical History:  Procedure Laterality Date  . ABDOMINAL HYSTERECTOMY    . CHOLECYSTECTOMY  2002  . LITHOTRIPSY    . PARATHYROIDECTOMY  2004   1 removed  . ROTATOR CUFF REPAIR  2002  . TONSILLECTOMY  1956  . TONSILLECTOMY    . TOTAL ABDOMINAL HYSTERECTOMY W/ BILATERAL SALPINGOOPHORECTOMY  1984  . VEIN LIGATION AND STRIPPING      SOCIAL HISTORY:   Social History   Tobacco Use  . Smoking status: Never Smoker  . Smokeless tobacco: Never Used  Substance Use Topics  . Alcohol use: No    FAMILY HISTORY:   Family History  Problem Relation Age of Onset  . Heart disease Brother   . Stroke Mother   . Skin cancer Father   . Breast cancer Paternal Grandmother     DRUG  ALLERGIES:   Allergies  Allergen Reactions  . Crestor [Rosuvastatin Calcium] Other (See Comments)    Myalgia   . Penicillins Rash  . Sulfa Antibiotics Rash  . Sulfonamide Derivatives Rash    REVIEW OF SYSTEMS:   Review of Systems  Constitutional: Negative.  Negative for chills, fever and malaise/fatigue.  HENT: Negative.  Negative for ear discharge, ear pain, hearing loss, nosebleeds and sore throat.   Eyes: Negative.  Negative for blurred vision and pain.       Vision loss left eye which is now improved  Respiratory: Negative.  Negative for cough, hemoptysis, shortness of breath and wheezing.   Cardiovascular: Negative.  Negative for chest pain, palpitations and leg swelling.  Gastrointestinal: Negative.  Negative for abdominal pain, blood in stool, diarrhea, nausea and vomiting.  Genitourinary: Negative.  Negative for dysuria.  Musculoskeletal: Negative.  Negative for back pain.  Skin: Negative.   Neurological: Negative for dizziness, tremors, speech change, focal weakness, seizures and headaches.  Endo/Heme/Allergies: Negative.  Does not bruise/bleed easily.  Psychiatric/Behavioral: Negative.  Negative for depression, hallucinations and suicidal ideas.    MEDICATIONS AT HOME:   Prior to Admission medications   Medication Sig Start Date End Date Taking? Authorizing Provider  ALPRAZolam (XANAX) 0.25 MG tablet Take 1 tablet (0.25 mg  total) by mouth 2 (two) times daily as needed for anxiety. 02/07/18  Yes Crecencio Mc, MD  Cholecalciferol (VITAMIN D) 2000 units tablet Take 2,000 Units by mouth daily.   Yes [provider]  COUMADIN 5 MG tablet Take as directed by coumadin clinic Patient taking differently: Take 2.5-5 mg by mouth See admin instructions. Take 1 tablet (5MG ) by mouth daily every Monday, Tuesday, Thursday, Friday, Saturday and Sunday and take  tablet (2.5MG ) by mouth on Wednesday 03/11/15  Yes Gollan, Kathlene November, MD  doxycycline (VIBRA-TABS) 100 MG tablet  Take 1 tablet (100 mg total) by mouth 2 (two) times daily. 05/03/18  Yes Crecencio Mc, MD  escitalopram (LEXAPRO) 10 MG tablet Take 1 tablet (10 mg total) by mouth daily. 05/02/18  Yes Crecencio Mc, MD  estradiol (ESTRACE) 0.1 MG/GM vaginal cream Place 1 g vaginally 3 (three) times a week. Pea sized amount per urethra 06/27/17  Yes Hollice Espy, MD  feeding supplement (BOOST HIGH PROTEIN) LIQD Take 1 Container by mouth daily.   Yes [provider]  losartan (COZAAR) 50 MG tablet TAKE 2 TABLETS (100 MG TOTAL) BY MOUTH DAILY. 01/24/18  Yes Crecencio Mc, MD  metroNIDAZOLE (METROCREAM) 0.75 % cream Apply 1 application topically 2 (two) times daily.  08/02/17  Yes [provider]  omeprazole (PRILOSEC) 40 MG capsule Take 40 mg by mouth daily.   Yes [provider]  ondansetron (ZOFRAN) 4 MG tablet Take 4 mg by mouth every 8 (eight) hours as needed for nausea.  04/19/18  Yes [provider]  predniSONE (DELTASONE) 10 MG tablet 6 tablets on Day 1 , then reduce by 1 tablet daily until gone Patient not taking: Reported on 05/10/2018 05/03/18   Crecencio Mc, MD      VITAL SIGNS:  Blood pressure (!) 145/69, pulse 74, temperature 97.7 F (36.5 C), temperature source Oral, resp. rate 16, height 5\' 3"  (1.6 m), weight 78.9 kg, SpO2 96 %.  PHYSICAL EXAMINATION:   Physical Exam  Constitutional: She is oriented to person, place, and time. No distress.  HENT:  Head: Normocephalic.  Eyes: No scleral icterus.  Neck: Normal range of motion. Neck supple. No JVD present. No tracheal deviation present.  Cardiovascular: Normal rate, regular rhythm and normal heart sounds. Exam reveals no gallop and no friction rub.  No murmur heard. Pulmonary/Chest: Effort normal and breath sounds normal. No respiratory distress. She has no wheezes. She has no rales. She exhibits no tenderness.  Abdominal: Soft. Bowel sounds are normal. She exhibits no distension and no mass. There is  no tenderness. There is no rebound and no guarding.  Musculoskeletal: Normal range of motion. She exhibits no edema.  Neurological: She is alert and oriented to person, place, and time.  Skin: Skin is warm. No rash noted. No erythema.  Psychiatric: Judgment normal.      LABORATORY PANEL:   CBC Recent Labs  Lab 05/10/18 1441  WBC 6.8  HGB 16.0*  HCT 48.3*  PLT 197   ------------------------------------------------------------------------------------------------------------------  Chemistries  Recent Labs  Lab 05/10/18 1441  NA 139  K 3.7  CL 100  CO2 27  GLUCOSE 182*  BUN 25*  CREATININE 0.80  CALCIUM 9.3  AST 29  ALT 27  ALKPHOS 58  BILITOT 1.5*   ------------------------------------------------------------------------------------------------------------------  Cardiac Enzymes Recent Labs  Lab 05/10/18 1441  TROPONINI <0.03   ------------------------------------------------------------------------------------------------------------------  RADIOLOGY:  Ct Angio Head W Or Wo Contrast  Result Date: 05/10/2018 CLINICAL DATA:  Complete left eye visual loss while driving today. EXAM: CT ANGIOGRAPHY HEAD AND NECK TECHNIQUE: Multidetector CT imaging of the head and neck was performed using the standard protocol during bolus administration of intravenous contrast. Multiplanar CT image reconstructions and MIPs were obtained to evaluate the vascular anatomy. Carotid stenosis measurements (when applicable) are obtained utilizing NASCET criteria, using the distal internal carotid diameter as the denominator. CONTRAST:  76mL OMNIPAQUE IOHEXOL 350 MG/ML SOLN COMPARISON:  None. FINDINGS: CTA NECK FINDINGS Aortic arch: Normal variant aortic arch branching pattern with common origin of the brachiocephalic and left common carotid arteries. Mild arch atherosclerosis. Widely patent arch vessel origins. Right carotid system: Patent without evidence of stenosis or dissection. Tortuous  proximal cervical ICA with fusiform aneurysmal dilatation over a length of 2 cm to a maximal diameter of 10 mm. Left carotid system: Patent without evidence of stenosis or dissection. Tortuous proximal to mid cervical ICA which is mildly ectatic with a maximal diameter of 7 mm. Vertebral arteries: Patent and codominant without evidence of stenosis or dissection. Skeleton: Mild cervical disc degeneration. Other neck: Diffusely heterogeneous thyroid with multiple underlying small nodules. Upper chest: Mosaic attenuation in the lung apices, possibly air trapping. Review of the MIP images confirms the above findings CTA HEAD FINDINGS Anterior circulation: The internal carotid arteries are widely patent from skull base to carotid termini. An infundibulum is noted at the left posterior communicating artery origin. ACAs and MCAs are patent without evidence of proximal branch occlusion or significant stenosis. No aneurysm is identified. Posterior circulation: The intracranial vertebral arteries are widely patent to the basilar. Patent PICA and SCA origins are identified bilaterally. The basilar artery is widely patent. There are small posterior communicating arteries bilaterally. PCAs are patent without evidence of significant stenosis. No aneurysm is identified. Venous sinuses: Patent. Anatomic variants: None. Review of the MIP images confirms the above findings IMPRESSION: 1. No emergent large vessel occlusion or significant arterial stenosis in the head and neck. 2. 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 3.  Aortic Atherosclerosis (ICD10-I70.0). Electronically Signed   By: Logan Bores M.D.   On: 05/10/2018 15:16   Ct Angio Neck W Or Wo Contrast  Result Date: 05/10/2018 CLINICAL DATA:  Complete left eye visual loss while driving today. EXAM: CT ANGIOGRAPHY HEAD AND NECK TECHNIQUE: Multidetector CT imaging of the head and neck was performed using the  standard protocol during bolus administration of intravenous contrast. Multiplanar CT image reconstructions and MIPs were obtained to evaluate the vascular anatomy. Carotid stenosis measurements (when applicable) are obtained utilizing NASCET criteria, using the distal internal carotid diameter as the denominator. CONTRAST:  9mL OMNIPAQUE IOHEXOL 350 MG/ML SOLN COMPARISON:  None. FINDINGS: CTA NECK FINDINGS Aortic arch: Normal variant aortic arch branching pattern with common origin of the brachiocephalic and left common carotid arteries. Mild arch atherosclerosis. Widely patent arch vessel origins. Right carotid system: Patent without evidence of stenosis or dissection. Tortuous proximal cervical ICA with fusiform aneurysmal dilatation over a length of 2 cm to a maximal diameter of 10 mm. Left carotid system: Patent without evidence of stenosis or dissection. Tortuous proximal to mid cervical ICA which is mildly ectatic with a maximal diameter of 7 mm. Vertebral arteries: Patent and codominant without evidence of stenosis or dissection. Skeleton: Mild cervical disc degeneration. Other neck: Diffusely heterogeneous thyroid with multiple underlying small nodules. Upper chest: Mosaic attenuation in the lung apices, possibly air trapping. Review of the MIP images confirms  the above findings CTA HEAD FINDINGS Anterior circulation: The internal carotid arteries are widely patent from skull base to carotid termini. An infundibulum is noted at the left posterior communicating artery origin. ACAs and MCAs are patent without evidence of proximal branch occlusion or significant stenosis. No aneurysm is identified. Posterior circulation: The intracranial vertebral arteries are widely patent to the basilar. Patent PICA and SCA origins are identified bilaterally. The basilar artery is widely patent. There are small posterior communicating arteries bilaterally. PCAs are patent without evidence of significant stenosis. No  aneurysm is identified. Venous sinuses: Patent. Anatomic variants: None. Review of the MIP images confirms the above findings IMPRESSION: 1. No emergent large vessel occlusion or significant arterial stenosis in the head and neck. 2. 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 3.  Aortic Atherosclerosis (ICD10-I70.0). Electronically Signed   By: Logan Bores M.D.   On: 05/10/2018 15:16   Ct Head Code Stroke Wo Contrast  Result Date: 05/10/2018 CLINICAL DATA:  Code stroke. Loss of vision in LEFT eye while driving. This occurred at 1200 hours earlier today. Patient reported to be neurologically intact EXAM: CT HEAD WITHOUT CONTRAST TECHNIQUE: Contiguous axial images were obtained from the base of the skull through the vertex without intravenous contrast. COMPARISON:  None. FINDINGS: Brain: No evidence for acute infarction, hemorrhage, mass lesion, hydrocephalus, or extra-axial fluid. Normal for age cerebral volume. Slight hypoattenuation of white matter, likely small vessel disease. Suspected small chronic lacune, LEFT basal ganglia. Vascular: Calcification of the cavernous internal carotid arteries consistent with cerebrovascular atherosclerotic disease. No signs of intracranial large vessel occlusion. Skull: Calvarium intact Sinuses/Orbits: No acute finding. Other: None. ASPECTS [facility] Stroke Program Early CT Score) - Ganglionic level infarction (caudate, lentiform nuclei, internal capsule, insula, M1-M3 cortex): 7 - Supraganglionic infarction (M4-M6 cortex): 3 Total score (0-10 with 10 being normal): 10 IMPRESSION: 1. Atrophy and small vessel disease. No acute intracranial findings. 2. ASPECTS is 10. These results were called by telephone at the time of interpretation on 05/10/2018 at 2:44 pm to Dr. Lenise Arena , who verbally acknowledged these results. * Electronically Signed   By: Staci Righter M.D.   On: 05/10/2018 14:48    EKG:   A.  fib heart rate 87 no ST elevation or depression IMPRESSION AND PLAN:   84 year old female with a history of idiopathic cardiopathy, atrial fibrillation on Coumadin, diabetes and essential hypertension who presents emergency room due to sudden vision loss of left eye.  1.  Sudden vision loss left eye concerning for TIA/CVA/embolic event with subtherapeutic INR: MRI/MRA brain ordered Echocardiogram ordered Continue Coumadin with goal INR 2-3 Continue telemetry monitoring PT, OT, speech consult Further recommendations after work-up and neurology will see patient again tomorrow. Check lipid panel and A1c ASA and statin.  2.  Chronic atrial fibrillation on Coumadin Pharmacy consultation for Coumadin dosing  3.  Essential hypertension: Continue losartan  4.  Lung nodule seen on chest x-ray and started on doxycycline which will continue  All the records are reviewed and case discussed with ED provider. Management plans discussed with the patient and she is in agreement  CODE STATUS: FULL  TOTAL TIME TAKING CARE OF THIS PATIENT: 45 minutes.    Ellington Cornia M.D on 05/10/2018 at 4:29 PM  Between 7am to 6pm - Pager - [phone]  After 6pm go to [email or web address] - Proofreader  [facility]  Office  [phone]  CC: Primary care physician; Derrel Nip,  Aris Everts, MD
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
## 2018-05-10 NOTE — ED Notes (Signed)
Patient transported to CT 
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
## 2018-05-10 NOTE — Progress Notes (Signed)
   05/10/18 1430  Clinical Encounter Type  Visited With Patient;Family  Visit Type Initial;Spiritual support;Code;ED  Referral From Nurse  Consult/Referral To Chaplain  Spiritual Encounters  Spiritual Needs Prayer;Emotional;Other (Comment)   [place] received a Code Stroke page. I reported to the patient's room and offered pastoral presences and spiritual support. I will continue to follow up with the patient as needed.

## 2018-05-10 NOTE — Consult Note (Signed)
Referring Physician: Conni Slipper    Chief Complaint: Left vision loss  HPI: SYMANTHA [name] is an 84 y.o. female with past medical history of diabetes mellitus type ll, hypertension, idiopathic cardiomyopathy, atrial fibrillation on coumadin presenting to the ED with sudden loss vision in the left eye. Patient states that she was driving home at around 12 pm  when she suddenly lost vision on the left side for about 5 minutes. Patient describes episode as painless loss of vision in the left eye without associated vertigo/dizziness. No symptoms of eye redness or pain and tearing associated with visual loss (intermittent angle closure glaucoma). Patient states nothing seem to precipitate episode such as postural changes or exercise, loss of vision when eyes are moved into certain positions of gaze (gaze-evoked amaurosis) or loss of vision after exercise or a hot shower (Uhthoff's symptom) to suggest demyelinating disease of the optic nerve. Denies associated altered sensorium, speech abnormality, cranial nerve deficit, seizures, focal motor or sensory deficits, diplopia, or vomiting, ipsilateral or contralateral paralysis/weakness, numbness or tingling sensation. Patient state that she has not had similar episode in the past or history of stroke. She state that she was recently started on Doxycycline for lung nodule found on chest x-ray on and had 3 days lefts on antibiotic. Patient state that her PCP Dr. Derrel Nip advised her to hold Warfarin for 3 days and restart on on 05/09/2018 at 5 mg. She checks her INR at home and noted that it was 1.6 so she called her cardiologist today for guidance. On arrival to the ED her symptoms had resolved. Initial NIH stroke scale was 0. CT head did not show acute intracranial abnormality. Follow up CTA head and neck did not show emergent large vessel occlusion or significant arterial stenosis.  Date last known well: Date: 05/10/2018 Time last known well: Time: 12:00 tPA Given:  No: Resolution of symptoms, Patient on coumadin with elevated PT.  Past Medical History:  Diagnosis Date  . Anxiety   . Arrhythmia    paroxysmal atrial fibrillation  . Colon polyp   . Diverticulitis   . Hard of hearing   . Hyperlipidemia   . Kidney stone   . Parathyroid disease ([place])    Parathyroidectomy   . PVC (premature ventricular contraction)   . Skin cancer   . UTI (lower urinary tract infection)     Past Surgical History:  Procedure Laterality Date  . ABDOMINAL HYSTERECTOMY    . CHOLECYSTECTOMY  2002  . LITHOTRIPSY    . PARATHYROIDECTOMY  2004   1 removed  . ROTATOR CUFF REPAIR  2002  . TONSILLECTOMY  1956  . TONSILLECTOMY    . TOTAL ABDOMINAL HYSTERECTOMY W/ BILATERAL SALPINGOOPHORECTOMY  1984  . VEIN LIGATION AND STRIPPING      Family History  Problem Relation Age of Onset  . Heart disease Brother   . Stroke Mother   . Skin cancer Father   . Breast cancer Paternal Grandmother    Social History:  reports that she has never smoked. She has never used smokeless tobacco. She reports that she does not drink alcohol or use drugs.  Allergies:  Allergies  Allergen Reactions  . Crestor [Rosuvastatin Calcium] Other (See Comments)    Myalgia   . Penicillins Rash  . Sulfa Antibiotics Rash  . Sulfonamide Derivatives Rash    Medications: I have reviewed the patient's current medications. Prior to Admission:  Prior to Admission medications   Medication Sig Start Date End Date Taking? Authorizing Provider  ALPRAZolam (XANAX) 0.25 MG tablet Take 1 tablet (0.25 mg total) by mouth 2 (two) times daily as needed for anxiety. 02/07/18  Yes Crecencio Mc, MD  Cholecalciferol (VITAMIN D) 2000 units tablet Take 2,000 Units by mouth daily.   Yes [provider]  COUMADIN 5 MG tablet Take as directed by coumadin clinic Patient taking differently: Take 2.5-5 mg by mouth See admin instructions. Take 1 tablet (5MG ) by mouth daily every Monday, Tuesday, Thursday,  Friday, Saturday and Sunday and take  tablet (2.5MG ) by mouth on Wednesday 03/11/15  Yes Gollan, Kathlene November, MD  doxycycline (VIBRA-TABS) 100 MG tablet Take 1 tablet (100 mg total) by mouth 2 (two) times daily. 05/03/18  Yes Crecencio Mc, MD  escitalopram (LEXAPRO) 10 MG tablet Take 1 tablet (10 mg total) by mouth daily. 05/02/18  Yes Crecencio Mc, MD  estradiol (ESTRACE) 0.1 MG/GM vaginal cream Place 1 g vaginally 3 (three) times a week. Pea sized amount per urethra 06/27/17  Yes Hollice Espy, MD  feeding supplement (BOOST HIGH PROTEIN) LIQD Take 1 Container by mouth daily.   Yes [provider]  losartan (COZAAR) 50 MG tablet TAKE 2 TABLETS (100 MG TOTAL) BY MOUTH DAILY. 01/24/18  Yes Crecencio Mc, MD  metroNIDAZOLE (METROCREAM) 0.75 % cream Apply 1 application topically 2 (two) times daily.  08/02/17  Yes [provider]  omeprazole (PRILOSEC) 40 MG capsule Take 40 mg by mouth daily.   Yes [provider]  ondansetron (ZOFRAN) 4 MG tablet Take 4 mg by mouth every 8 (eight) hours as needed for nausea.  04/19/18  Yes [provider]  predniSONE (DELTASONE) 10 MG tablet 6 tablets on Day 1 , then reduce by 1 tablet daily until gone Patient not taking: Reported on 05/10/2018 05/03/18   Crecencio Mc, MD    ROS: History obtained from the patient   General ROS: negative for - chills, fatigue, fever, night sweats, weight gain or weight loss Psychological ROS: negative for - behavioral disorder, hallucinations, memory difficulties, mood swings or suicidal ideation Ophthalmic ROS: negative for - blurry vision, double vision, eye pain or loss of vision ENT ROS: negative for - epistaxis, nasal discharge, oral lesions, sore throat, tinnitus or vertigo Allergy and Immunology ROS: negative for - hives or itchy/watery eyes Hematological and Lymphatic ROS: negative for - bleeding problems, bruising or swollen lymph nodes Endocrine ROS: negative for - galactorrhea,  hair pattern changes, polydipsia/polyuria or temperature intolerance Respiratory ROS: negative for - cough, hemoptysis, shortness of breath or wheezing Cardiovascular ROS: negative for - chest pain, dyspnea on exertion, edema or irregular heartbeat Gastrointestinal ROS: negative for - abdominal pain, diarrhea, hematemesis, nausea/vomiting or stool incontinence Genito-Urinary ROS: negative for - dysuria, hematuria, incontinence or urinary frequency/urgency Musculoskeletal ROS: negative for - joint swelling or muscular weakness Neurological ROS: as noted in HPI Dermatological ROS: negative for rash and skin lesion changes  Physical Examination: Blood pressure (!) 145/69, pulse 74, temperature 97.7 F (36.5 C), temperature source Oral, resp. rate 16, height 5\' 3"  (1.6 m), weight 78.9 kg, SpO2 96 %.   HEENT-  Normocephalic, no lesions, without obvious abnormality.  Normal external eye and conjunctiva.  Normal TM's bilaterally.  Normal auditory canals and external ears. Normal external nose, mucus membranes and septum.  Normal pharynx. Cardiovascular- S1, S2 normal, pulses palpable throughout   Lungs- chest clear, no wheezing, rales, normal symmetric air entry Abdomen- soft, non-tender; bowel sounds normal; no masses,  no organomegaly Extremities- no edema  Lymph-no adenopathy palpable Musculoskeletal-no joint tenderness, deformity or swelling Skin-warm and dry, no hyperpigmentation, vitiligo, or suspicious lesions  Neurological Exam   Mental Status: Alert, oriented, thought content appropriate.  Speech fluent without evidence of aphasia.  Able to follow 3 step commands without difficulty. Attention span and concentration seemed appropriate  Cranial Nerves: II: Discs flat bilaterally; Visual fields grossly normal, pupils equal, round, reactive to light and accommodation III,IV, VI: ptosis not present, extra-ocular motions intact bilaterally V,VII: smile symmetric, facial light touch sensation  intact VIII: hearing normal bilaterally IX,X: gag reflex present XI: bilateral shoulder shrug XII: midline tongue extension Motor: Right :  Upper extremity   5/5 Without pronator drift      Left: Upper extremity   5/5 without pronator drift Right:   Lower extremity   5/5                                          Left: Lower extremity   5/5 Tone and bulk:normal tone throughout; no atrophy noted Sensory: Pinprick and light touch intact bilaterally Deep Tendon Reflexes: 2+ and symmetric throughout Plantars: Right: mute                              Left: mute Cerebellar: Finger-to-nose testing intact bilaterally. Heel to shin testing normal bilaterally Gait: not tested due to safety concerns  Data Reviewed  Laboratory Studies:  Basic Metabolic Panel: Recent Labs  Lab 05/10/18 1441  NA 139  K 3.7  CL 100  CO2 27  GLUCOSE 182*  BUN 25*  CREATININE 0.80  CALCIUM 9.3    Liver Function Tests: Recent Labs  Lab 05/10/18 1441  AST 29  ALT 27  ALKPHOS 58  BILITOT 1.5*  PROT 6.5  ALBUMIN 3.8   No results for input(s): LIPASE, AMYLASE in the last 168 hours. No results for input(s): AMMONIA in the last 168 hours.  CBC: Recent Labs  Lab 05/10/18 1441  WBC 6.8  NEUTROABS 5.3  HGB 16.0*  HCT 48.3*  MCV 95.5  PLT 197    Cardiac Enzymes: Recent Labs  Lab 05/10/18 1441  TROPONINI <0.03    BNP: Invalid input(s): POCBNP  CBG: Recent Labs  Lab 05/10/18 1434  GLUCAP 159*    Microbiology: Results for orders placed or performed in visit on 11/15/17  Microscopic Examination     Status: Abnormal   Collection Time: 11/15/17  1:58 PM  Result Value Ref Range Status   WBC, UA >30 (H) 0 - 5 /hpf Final   RBC, UA 3-10 (A) 0 - 2 /hpf Final   Epithelial Cells (non renal) 0-10 0 - 10 /hpf Final   Casts Present (A) None seen /lpf Final   Cast Type Hyaline casts N/A Final   Mucus, UA Present (A) Not Estab. Final   Bacteria, UA Many (A) None seen/Few Final  CULTURE, URINE  COMPREHENSIVE     Status: Abnormal   Collection Time: 11/15/17  2:50 PM  Result Value Ref Range Status   Urine Culture, Comprehensive Final report (A)  Final   Organism ID, Bacteria Escherichia coli (A)  Final    Comment: Greater than 100,000 colony forming units per mL Cefazolin <=4 ug/mL Cefazolin with an MIC <=16 predicts susceptibility to the oral agents cefaclor, cefdinir, cefpodoxime, cefprozil, cefuroxime, cephalexin, and loracarbef when used for therapy  of uncomplicated urinary tract infections due to E. coli, Klebsiella pneumoniae, and Proteus mirabilis.    ANTIMICROBIAL SUSCEPTIBILITY Comment  Final    Comment:       ** S = Susceptible; I = Intermediate; R = Resistant **                    P = Positive; N = Negative             MICS are expressed in micrograms per mL    Antibiotic                 RSLT#1    RSLT#2    RSLT#3    RSLT#4 Amoxicillin/Clavulanic Acid    S Ampicillin                     R Cefepime                       S Ceftriaxone                    S Cefuroxime                     S Ciprofloxacin                  R Ertapenem                      S Gentamicin                     S Imipenem                       S Levofloxacin                   R Meropenem                      S Nitrofurantoin                 S Piperacillin/Tazobactam        S Tetracycline                   S Tobramycin                     S Trimethoprim/Sulfa             S     Coagulation Studies: Recent Labs    05/10/18 1441  LABPROT 19.3*  INR 1.65    Urinalysis: No results for input(s): COLORURINE, LABSPEC, PHURINE, GLUCOSEU, HGBUR, BILIRUBINUR, KETONESUR, PROTEINUR, UROBILINOGEN, NITRITE, LEUKOCYTESUR in the last 168 hours.  Invalid input(s): APPERANCEUR  Lipid Panel:    Component Value Date/Time   CHOL 203 (H) 11/06/2017 0813   CHOL 254 (H) 11/06/2014 0823   TRIG 65.0 11/06/2017 0813   TRIG 85 09/07/2009   HDL 69.30 11/06/2017 0813   HDL 85 11/06/2014 0823   CHOLHDL  3 11/06/2017 0813   VLDL 13.0 11/06/2017 0813   LDLCALC 120 (H) 11/06/2017 0813   LDLCALC 154 (H) 11/06/2014 0823    HgbA1C:  Lab Results  Component Value Date   HGBA1C 6.3 (A) 02/07/2018    Urine Drug Screen:  No results found for: LABOPIA, COCAINSCRNUR, LABBENZ, AMPHETMU, THCU, LABBARB  Alcohol Level: No results for input(s): ETH in the last 168 hours.  Other results: EKG:  atrial fibrillation, rate 87 Vent. rate 87 BPM PR interval * ms QRS duration 92 ms QT/QTc 386/408 ms P-R-T axes * 53 262  Imaging: Ct Angio Head W Or Wo Contrast  Result Date: 05/10/2018 CLINICAL DATA:  Complete left eye visual loss while driving today. EXAM: CT ANGIOGRAPHY HEAD AND NECK TECHNIQUE: Multidetector CT imaging of the head and neck was performed using the standard protocol during bolus administration of intravenous contrast. Multiplanar CT image reconstructions and MIPs were obtained to evaluate the vascular anatomy. Carotid stenosis measurements (when applicable) are obtained utilizing NASCET criteria, using the distal internal carotid diameter as the denominator. CONTRAST:  44mL OMNIPAQUE IOHEXOL 350 MG/ML SOLN COMPARISON:  None. FINDINGS: CTA NECK FINDINGS Aortic arch: Normal variant aortic arch branching pattern with common origin of the brachiocephalic and left common carotid arteries. Mild arch atherosclerosis. Widely patent arch vessel origins. Right carotid system: Patent without evidence of stenosis or dissection. Tortuous proximal cervical ICA with fusiform aneurysmal dilatation over a length of 2 cm to a maximal diameter of 10 mm. Left carotid system: Patent without evidence of stenosis or dissection. Tortuous proximal to mid cervical ICA which is mildly ectatic with a maximal diameter of 7 mm. Vertebral arteries: Patent and codominant without evidence of stenosis or dissection. Skeleton: Mild cervical disc degeneration. Other neck: Diffusely heterogeneous thyroid with multiple underlying small  nodules. Upper chest: Mosaic attenuation in the lung apices, possibly air trapping. Review of the MIP images confirms the above findings CTA HEAD FINDINGS Anterior circulation: The internal carotid arteries are widely patent from skull base to carotid termini. An infundibulum is noted at the left posterior communicating artery origin. ACAs and MCAs are patent without evidence of proximal branch occlusion or significant stenosis. No aneurysm is identified. Posterior circulation: The intracranial vertebral arteries are widely patent to the basilar. Patent PICA and SCA origins are identified bilaterally. The basilar artery is widely patent. There are small posterior communicating arteries bilaterally. PCAs are patent without evidence of significant stenosis. No aneurysm is identified. Venous sinuses: Patent. Anatomic variants: None. Review of the MIP images confirms the above findings IMPRESSION: 1. No emergent large vessel occlusion or significant arterial stenosis in the head and neck. 2. 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 3.  Aortic Atherosclerosis (ICD10-I70.0). Electronically Signed   By: Logan Bores M.D.   On: 05/10/2018 15:16   Ct Angio Neck W Or Wo Contrast  Result Date: 05/10/2018 CLINICAL DATA:  Complete left eye visual loss while driving today. EXAM: CT ANGIOGRAPHY HEAD AND NECK TECHNIQUE: Multidetector CT imaging of the head and neck was performed using the standard protocol during bolus administration of intravenous contrast. Multiplanar CT image reconstructions and MIPs were obtained to evaluate the vascular anatomy. Carotid stenosis measurements (when applicable) are obtained utilizing NASCET criteria, using the distal internal carotid diameter as the denominator. CONTRAST:  23mL OMNIPAQUE IOHEXOL 350 MG/ML SOLN COMPARISON:  None. FINDINGS: CTA NECK FINDINGS Aortic arch: Normal variant aortic arch branching pattern with common  origin of the brachiocephalic and left common carotid arteries. Mild arch atherosclerosis. Widely patent arch vessel origins. Right carotid system: Patent without evidence of stenosis or dissection. Tortuous proximal cervical ICA with fusiform aneurysmal dilatation over a length of 2 cm to a maximal diameter of 10 mm. Left carotid system: Patent without evidence of stenosis or dissection. Tortuous proximal to mid cervical ICA which is mildly ectatic with a maximal diameter of 7 mm. Vertebral arteries: Patent  and codominant without evidence of stenosis or dissection. Skeleton: Mild cervical disc degeneration. Other neck: Diffusely heterogeneous thyroid with multiple underlying small nodules. Upper chest: Mosaic attenuation in the lung apices, possibly air trapping. Review of the MIP images confirms the above findings CTA HEAD FINDINGS Anterior circulation: The internal carotid arteries are widely patent from skull base to carotid termini. An infundibulum is noted at the left posterior communicating artery origin. ACAs and MCAs are patent without evidence of proximal branch occlusion or significant stenosis. No aneurysm is identified. Posterior circulation: The intracranial vertebral arteries are widely patent to the basilar. Patent PICA and SCA origins are identified bilaterally. The basilar artery is widely patent. There are small posterior communicating arteries bilaterally. PCAs are patent without evidence of significant stenosis. No aneurysm is identified. Venous sinuses: Patent. Anatomic variants: None. Review of the MIP images confirms the above findings IMPRESSION: 1. No emergent large vessel occlusion or significant arterial stenosis in the head and neck. 2. 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 3.  Aortic Atherosclerosis (ICD10-I70.0). Electronically Signed   By: Logan Bores M.D.   On: 05/10/2018 15:16   Ct Head Code Stroke Wo  Contrast  Result Date: 05/10/2018 CLINICAL DATA:  Code stroke. Loss of vision in LEFT eye while driving. This occurred at 1200 hours earlier today. Patient reported to be neurologically intact EXAM: CT HEAD WITHOUT CONTRAST TECHNIQUE: Contiguous axial images were obtained from the base of the skull through the vertex without intravenous contrast. COMPARISON:  None. FINDINGS: Brain: No evidence for acute infarction, hemorrhage, mass lesion, hydrocephalus, or extra-axial fluid. Normal for age cerebral volume. Slight hypoattenuation of white matter, likely small vessel disease. Suspected small chronic lacune, LEFT basal ganglia. Vascular: Calcification of the cavernous internal carotid arteries consistent with cerebrovascular atherosclerotic disease. No signs of intracranial large vessel occlusion. Skull: Calvarium intact Sinuses/Orbits: No acute finding. Other: None. ASPECTS [facility] Stroke Program Early CT Score) - Ganglionic level infarction (caudate, lentiform nuclei, internal capsule, insula, M1-M3 cortex): 7 - Supraganglionic infarction (M4-M6 cortex): 3 Total score (0-10 with 10 being normal): 10 IMPRESSION: 1. Atrophy and small vessel disease. No acute intracranial findings. 2. ASPECTS is 10. These results were called by telephone at the time of interpretation on 05/10/2018 at 2:44 pm to Dr. Lenise Arena , who verbally acknowledged these results. * Electronically Signed   By: Staci Righter M.D.   On: 05/10/2018 14:48   Patient seen and examined.  Clinical course and management discussed.  Necessary edits performed.  I agree with the above.  Assessment and plan of care developed and discussed below.    Assessment: 84 y.o. female with past medical history of diabetes mellitus type ll, hypertension, idiopathic cardiomyopathy, atrial fibrillation on coumadin presenting to the ED with sudden loss vision in the left eye.  CT head did not show acute intracranial abnormalities. Follow up CTA head and neck  did not show emergent large vessel occlusion or significant arterial stenosis. Right ICA aneurysm noted.  Concerns remains for possible embolic event due to history of afib with subtherapeutic INR. Further management recommended   Stroke Risk Factors - atrial fibrillation, diabetes mellitus, family history, hyperlipidemia and hypertension  Plan: 1. HgbA1c, fasting lipid panel 2. MRI of the brain without contrast 3. Echocardiogram 4. Prophylactic therapy-Continue Coumadin with goal INR between 2 and 3 5. NPO until RN stroke swallow screen 6. Telemetry monitoring 7. Frequent neuro checks  This patient was staffed with Dr.  Alexis Goodell who personally evaluated patient, reviewed documentation and agreed with assessment and plan of care as above.  Rufina Falco, DNP, FNP-BC Board certified Nurse Practitioner Neurology Department  05/10/2018, 3:22 PM  Alexis Goodell, MD Neurology [phone]  05/10/2018  4:13 PM

## 2018-05-10 NOTE — ED Triage Notes (Signed)
Pt in via POV, pt reports complete  loss of vision to left eye while driving, onset of symptoms 1200 today.  Pt denies any other symptoms.  Pt neurologically intact at his time.  Vitals WDL.

## 2018-05-11 ENCOUNTER — Inpatient Hospital Stay
Admit: 2018-05-11 | Discharge: 2018-05-11 | Disposition: A | Discharge status: Home / Self Care | Payer: Medicare Other | Attending: Internal Medicine | Admitting: Internal Medicine

## 2018-05-11 DIAGNOSIS — I639 Cerebral infarction, unspecified: Secondary | ICD-10-CM | POA: Diagnosis not present

## 2018-05-11 DIAGNOSIS — G459 Transient cerebral ischemic attack, unspecified: Secondary | ICD-10-CM | POA: Diagnosis not present

## 2018-05-11 LAB — ECHOCARDIOGRAM COMPLETE
Height: 63 in
Weight: 2784 oz

## 2018-05-11 LAB — LIPID PANEL
CHOLESTEROL: 194 mg/dL (ref 0–200)
HDL: 68 mg/dL (ref >40)
LDL Cholesterol: 96 mg/dL (ref 0–99)
TRIGLYCERIDES: 150 mg/dL — AB (ref <150)
Total CHOL/HDL Ratio: 2.9 RATIO
VLDL: 30 mg/dL (ref 0–40)

## 2018-05-11 LAB — PROTIME-INR
INR: 1.78
Prothrombin Time: 20.5 seconds — ABNORMAL HIGH (ref 11.4–15.2)

## 2018-05-11 LAB — HEMOGLOBIN A1C
HEMOGLOBIN A1C: 6.3 % — AB (ref 4.8–5.6)
MEAN PLASMA GLUCOSE: 134.11 mg/dL

## 2018-05-11 MED ORDER — WARFARIN SODIUM 5 MG PO TABS
5.0000 mg | ORAL_TABLET | Freq: Once | ORAL | Status: DC
  Filled 2018-05-11: qty 1

## 2018-05-11 MED ORDER — PRAVASTATIN SODIUM 20 MG PO TABS: 20.0000 mg | ORAL_TABLET | Freq: Every day | ORAL | 0 refills | Status: DC

## 2018-05-11 NOTE — Consult Note (Addendum)
ANTICOAGULATION CONSULT NOTE - Initial Consult  Pharmacy Consult for warfarin Indication: atrial fibrillation  Patient Measurements: Height: 5\' 3"  (160 cm) Weight: 174 lb (78.9 kg) IBW/kg (Calculated) : 52.4  Vital Signs: Temp: 97.8 F (36.6 C) (11/23 0640) Temp Source: Oral (11/23 0640) BP: 157/93 (11/23 0640) Pulse Rate: 53 (11/23 0640)  Labs: Recent Labs    05/10/18 1441 05/11/18 0422  HGB 16.0*  --   HCT 48.3*  --   PLT 197  --   APTT 29  --   LABPROT 19.3* 20.5*  INR 1.65 1.78  CREATININE 0.80  --   TROPONINI <0.03  --     Estimated Creatinine Clearance: 53.9 mL/min (by C-G formula based on SCr of 0.8 mg/dL).   Medical History: Past Medical History:  Diagnosis Date  . Anxiety   . Arrhythmia    paroxysmal atrial fibrillation  . Colon polyp   . Diverticulitis   . Hard of hearing   . Hyperlipidemia   . Kidney stone   . Parathyroid disease ([place])    Parathyroidectomy   . PVC (premature ventricular contraction)   . Skin cancer   . UTI (lower urinary tract infection)     Medications:  Scheduled:   Assessment: Patient is a 84 year old female w/ PMH of afib on warfarin at home. Pt presents today with sudden loss of vision in left eye for 5 min. she was recently started on Doxycycline for lung nodule found on chest x-ray on and had 3 days lefts on antibiotic. Patient state that her PCP Dr. Derrel Nip advised her to hold Warfarin for 3 days and restart on on 05/09/2018 at 5 mg. She checks her INR at home and noted that it was 1.6 so she called her cardiologist today for guidance.  Home dose is 5mg  daily expect 2.5mg  on Wed. There are no new DDIs since the last note  Date  INR   Dose 11/22  1.65  5mg  at home 11/23  1.78  5mg   Goal of Therapy:  INR 2-3 Monitor platelets by anticoagulation protocol: Yes   Plan:  INR is beginning to rebound already. Therefore, we will begin with home dose tonight and recheck INR in the morning to guide dosing.  Vallery Sa,  PharmD Clinical Pharmacist 05/11/2018,7:41 AM

## 2018-05-11 NOTE — Progress Notes (Signed)
Reviewed discharge instructions, follow up appointments, homes meds and prescriptions with patient  As she verbalized understanding, discharge to home with daughter.

## 2018-05-11 NOTE — Progress Notes (Signed)
Patient had additional questions about her coumadin, Dr Jerelyn Charles spoke to her over the phone and she said he resolved her issue.

## 2018-05-11 NOTE — Progress Notes (Signed)
Per Dr Jerelyn Charles discharging patient after echo was completed per his order.  He is aware that patient continues to have, bigiminy, trigiminy and multifocal PVC's.

## 2018-05-11 NOTE — Evaluation (Signed)
SLP Cancellation Note  Patient Details Name: Linda [name] MRN: [id number] DOB: 07/17/1933   Cancelled treatment:       Reason Eval/Treat Not Completed: SLP screened, no needs identified, will sign off SLP educated pt that if changes in status occur to let NSG know and slp will be contacted. Pt had tray in front of her and was noted to have no instances of coughing or choking and reported no changes in speech or swallow. SLP will follow up as needed.   [name] 05/11/2018, 9:28 AM

## 2018-05-11 NOTE — Discharge Summary (Signed)
[facility] NAME: Linda [name]    MR#:  [id number]  DATE OF BIRTH:  Dec 12, 1933  DATE OF ADMISSION:  05/10/2018 ADMITTING PHYSICIAN: Bettey Costa, MD  DATE OF DISCHARGE: No discharge date for patient encounter.  PRIMARY CARE PHYSICIAN: Crecencio Mc, MD    ADMISSION DIAGNOSIS:  TIA (transient ischemic attack) [G45.9]  DISCHARGE DIAGNOSIS:  Active Problems:   CVA (cerebral vascular accident) ([place])   SECONDARY DIAGNOSIS:   Past Medical History:  Diagnosis Date  . Anxiety   . Arrhythmia    paroxysmal atrial fibrillation  . Colon polyp   . Diverticulitis   . Hard of hearing   . Hyperlipidemia   . Kidney stone   . Parathyroid disease ([place])    Parathyroidectomy   . PVC (premature ventricular contraction)   . Skin cancer   . UTI (lower urinary tract infection)     HOSPITAL COURSE:    84 year old female with a history of idiopathic cardiopathy, atrial fibrillation on Coumadin, diabetes and essential hypertension who presents emergency room due to sudden vision loss of left eye.  *Acute right punctate cerebrovascular accident  Treated on our CVA protocol, neurology to see patient while in house-recommended continued Coumadin use, patient was not interested in converting to another anticoagulant, will have patient follow-up with neurology and primary care provider status post discharge for continued care   *Chronic atrial fibrillation on Coumadin Pharmacy consulted for Coumadin dosing while in house  3.  Essential hypertension Stable on losartan  4.  Lung nodule  Continue cycling, patient follow-up primary care provider for reevaluation 1 week   DISCHARGE CONDITIONS:   stable  CONSULTS OBTAINED:  Treatment Team:  Catarina Hartshorn, MD  DRUG ALLERGIES:   Allergies  Allergen Reactions  . Crestor [Rosuvastatin Calcium] Other (See Comments)    Myalgia   . Penicillins Rash  . Sulfa Antibiotics  Rash  . Sulfonamide Derivatives Rash    DISCHARGE MEDICATIONS:   Allergies as of 05/11/2018      Reactions   Crestor [rosuvastatin Calcium] Other (See Comments)   Myalgia   Penicillins Rash   Sulfa Antibiotics Rash   Sulfonamide Derivatives Rash      Medication List    TAKE these medications   ALPRAZolam 0.25 MG tablet Commonly known as:  XANAX Take 1 tablet (0.25 mg total) by mouth 2 (two) times daily as needed for anxiety.   COUMADIN 5 MG tablet Generic drug:  warfarin Take as directed by coumadin clinic What changed:    how much to take  how to take this  when to take this  additional instructions   doxycycline 100 MG tablet Commonly known as:  VIBRA-TABS Take 1 tablet (100 mg total) by mouth 2 (two) times daily.   escitalopram 10 MG tablet Commonly known as:  LEXAPRO Take 1 tablet (10 mg total) by mouth daily.   estradiol 0.1 MG/GM vaginal cream Commonly known as:  ESTRACE Place 1 g vaginally 3 (three) times a week. Pea sized amount per urethra   feeding supplement Liqd Take 1 Container by mouth daily.   losartan 50 MG tablet Commonly known as:  COZAAR TAKE 2 TABLETS (100 MG TOTAL) BY MOUTH DAILY.   metroNIDAZOLE 0.75 % cream Commonly known as:  METROCREAM Apply 1 application topically 2 (two) times daily.   omeprazole 40 MG capsule Commonly known as:  PRILOSEC Take 40 mg by mouth daily.   ondansetron 4 MG tablet Commonly known  as:  ZOFRAN Take 4 mg by mouth every 8 (eight) hours as needed for nausea.   pravastatin 20 MG tablet Commonly known as:  PRAVACHOL Take 1 tablet (20 mg total) by mouth daily at 6 PM.   predniSONE 10 MG tablet Commonly known as:  DELTASONE 6 tablets on Day 1 , then reduce by 1 tablet daily until gone   Vitamin D 50 MCG (2000 UT) tablet Take 2,000 Units by mouth daily.        DISCHARGE INSTRUCTIONS:      If you experience worsening of your admission symptoms, develop shortness of breath, life threatening  emergency, suicidal or homicidal thoughts you must seek medical attention immediately by calling 911 or calling your MD immediately  if symptoms less severe.  You Must read complete instructions/literature along with all the possible adverse reactions/side effects for all the Medicines you take and that have been prescribed to you. Take any new Medicines after you have completely understood and accept all the possible adverse reactions/side effects.   Please note  You were cared for by a hospitalist during your hospital stay. If you have any questions about your discharge medications or the care you received while you were in the hospital after you are discharged, you can call the unit and asked to speak with the hospitalist on call if the hospitalist that took care of you is not available. Once you are discharged, your primary care physician will handle any further medical issues. Please note that NO REFILLS for any discharge medications will be authorized once you are discharged, as it is imperative that you return to your primary care physician (or establish a relationship with a primary care physician if you do not have one) for your aftercare needs so that they can reassess your need for medications and monitor your lab values.    Today   CHIEF COMPLAINT:   Chief Complaint  Patient presents with  . Code Stroke    HISTORY OF PRESENT ILLNESS:  84 y.o. female with a known history of diabetes, cardiomyopathy and atrial fibrillation on Coumadin who presented to the ER due to sudden loss of vision in left eye.  Patient reports that while she was driving home approximately noon she lost vision of her left eye.  She reports this lasted approximately 15 minutes. She was evaluated by neurology while in the emergency room. CT of the head and CTA head neck are essentially unremarkable. Initial NIH is 0.   VITAL SIGNS:  Blood pressure 136/71, pulse (!) 59, temperature 97.9 F (36.6 C),  temperature source Oral, resp. rate 17, height 5\' 3"  (1.6 m), weight 78.9 kg, SpO2 97 %.  I/O:    Intake/Output Summary (Last 24 hours) at 05/11/2018 1152 Last data filed at 05/11/2018 1005 Gross per 24 hour  Intake 240 ml  Output -  Net 240 ml    PHYSICAL EXAMINATION:  GENERAL:  84 y.o.-year-old patient lying in the bed with no acute distress.  EYES: Pupils equal, round, reactive to light and accommodation. No scleral icterus. Extraocular muscles intact.  HEENT: Head atraumatic, normocephalic. Oropharynx and nasopharynx clear.  NECK:  Supple, no jugular venous distention. No thyroid enlargement, no tenderness.  LUNGS: Normal breath sounds bilaterally, no wheezing, rales,rhonchi or crepitation. No use of accessory muscles of respiration.  CARDIOVASCULAR: S1, S2 normal. No murmurs, rubs, or gallops.  ABDOMEN: Soft, non-tender, non-distended. Bowel sounds present. No organomegaly or mass.  EXTREMITIES: No pedal edema, cyanosis, or clubbing.  NEUROLOGIC: Cranial  nerves II through XII are intact. Muscle strength 5/5 in all extremities. Sensation intact. Gait not checked.  PSYCHIATRIC: The patient is alert and oriented x 3.  SKIN: No obvious rash, lesion, or ulcer.   DATA REVIEW:   CBC Recent Labs  Lab 05/10/18 1441  WBC 6.8  HGB 16.0*  HCT 48.3*  PLT 197    Chemistries  Recent Labs  Lab 05/10/18 1441  NA 139  K 3.7  CL 100  CO2 27  GLUCOSE 182*  BUN 25*  CREATININE 0.80  CALCIUM 9.3  AST 29  ALT 27  ALKPHOS 91  BILITOT 1.5*    Cardiac Enzymes Recent Labs  Lab 05/10/18 1441  [place] <0.03    Microbiology Results  Results for orders placed or performed in visit on 11/15/17  Microscopic Examination     Status: Abnormal   Collection Time: 11/15/17  1:58 PM  Result Value Ref Range Status   WBC, UA >30 (H) 0 - 5 /hpf Final   RBC, UA 3-10 (A) 0 - 2 /hpf Final   Epithelial Cells (non renal) 0-10 0 - 10 /hpf Final   Casts Present (A) None seen /lpf Final    Cast Type Hyaline casts N/A Final   Mucus, UA Present (A) Not Estab. Final   Bacteria, UA Many (A) None seen/Few Final  CULTURE, URINE COMPREHENSIVE     Status: Abnormal   Collection Time: 11/15/17  2:50 PM  Result Value Ref Range Status   Urine Culture, Comprehensive Final report (A)  Final   Organism ID, Bacteria Escherichia coli (A)  Final    Comment: Greater than 100,000 colony forming units per mL Cefazolin <=4 ug/mL Cefazolin with an MIC <=16 predicts susceptibility to the oral agents cefaclor, cefdinir, cefpodoxime, cefprozil, cefuroxime, cephalexin, and loracarbef when used for therapy of uncomplicated urinary tract infections due to E. coli, Klebsiella pneumoniae, and Proteus mirabilis.    ANTIMICROBIAL SUSCEPTIBILITY Comment  Final    Comment:       ** S = Susceptible; I = Intermediate; R = Resistant **                    P = Positive; N = Negative             MICS are expressed in micrograms per mL    Antibiotic                 RSLT#1    RSLT#2    RSLT#3    RSLT#4 Amoxicillin/Clavulanic Acid    S Ampicillin                     R Cefepime                       S Ceftriaxone                    S Cefuroxime                     S Ciprofloxacin                  R Ertapenem                      S Gentamicin                     S Imipenem  S Levofloxacin                   R Meropenem                      S Nitrofurantoin                 S Piperacillin/Tazobactam        S Tetracycline                   S Tobramycin                     S Trimethoprim/Sulfa             S     RADIOLOGY:  Ct Angio Head W Or Wo Contrast  Result Date: 05/10/2018 CLINICAL DATA:  Complete left eye visual loss while driving today. EXAM: CT ANGIOGRAPHY HEAD AND NECK TECHNIQUE: Multidetector CT imaging of the head and neck was performed using the standard protocol during bolus administration of intravenous contrast. Multiplanar CT image reconstructions and MIPs were obtained  to evaluate the vascular anatomy. Carotid stenosis measurements (when applicable) are obtained utilizing NASCET criteria, using the distal internal carotid diameter as the denominator. CONTRAST:  105mL OMNIPAQUE IOHEXOL 350 MG/ML SOLN COMPARISON:  None. FINDINGS: CTA NECK FINDINGS Aortic arch: Normal variant aortic arch branching pattern with common origin of the brachiocephalic and left common carotid arteries. Mild arch atherosclerosis. Widely patent arch vessel origins. Right carotid system: Patent without evidence of stenosis or dissection. Tortuous proximal cervical ICA with fusiform aneurysmal dilatation over a length of 2 cm to a maximal diameter of 10 mm. Left carotid system: Patent without evidence of stenosis or dissection. Tortuous proximal to mid cervical ICA which is mildly ectatic with a maximal diameter of 7 mm. Vertebral arteries: Patent and codominant without evidence of stenosis or dissection. Skeleton: Mild cervical disc degeneration. Other neck: Diffusely heterogeneous thyroid with multiple underlying small nodules. Upper chest: Mosaic attenuation in the lung apices, possibly air trapping. Review of the MIP images confirms the above findings CTA HEAD FINDINGS Anterior circulation: The internal carotid arteries are widely patent from skull base to carotid termini. An infundibulum is noted at the left posterior communicating artery origin. ACAs and MCAs are patent without evidence of proximal branch occlusion or significant stenosis. No aneurysm is identified. Posterior circulation: The intracranial vertebral arteries are widely patent to the basilar. Patent PICA and SCA origins are identified bilaterally. The basilar artery is widely patent. There are small posterior communicating arteries bilaterally. PCAs are patent without evidence of significant stenosis. No aneurysm is identified. Venous sinuses: Patent. Anatomic variants: None. Review of the MIP images confirms the above findings IMPRESSION:  1. No emergent large vessel occlusion or significant arterial stenosis in the head and neck. 2. 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 3.  Aortic Atherosclerosis (ICD10-I70.0). Electronically Signed   By: Logan Bores M.D.   On: 05/10/2018 15:16   Ct Angio Neck W Or Wo Contrast  Result Date: 05/10/2018 CLINICAL DATA:  Complete left eye visual loss while driving today. EXAM: CT ANGIOGRAPHY HEAD AND NECK TECHNIQUE: Multidetector CT imaging of the head and neck was performed using the standard protocol during bolus administration of intravenous contrast. Multiplanar CT image reconstructions and MIPs were obtained to evaluate the vascular anatomy. Carotid stenosis measurements (when applicable) are obtained utilizing NASCET criteria, using the distal internal carotid  diameter as the denominator. CONTRAST:  57mL OMNIPAQUE IOHEXOL 350 MG/ML SOLN COMPARISON:  None. FINDINGS: CTA NECK FINDINGS Aortic arch: Normal variant aortic arch branching pattern with common origin of the brachiocephalic and left common carotid arteries. Mild arch atherosclerosis. Widely patent arch vessel origins. Right carotid system: Patent without evidence of stenosis or dissection. Tortuous proximal cervical ICA with fusiform aneurysmal dilatation over a length of 2 cm to a maximal diameter of 10 mm. Left carotid system: Patent without evidence of stenosis or dissection. Tortuous proximal to mid cervical ICA which is mildly ectatic with a maximal diameter of 7 mm. Vertebral arteries: Patent and codominant without evidence of stenosis or dissection. Skeleton: Mild cervical disc degeneration. Other neck: Diffusely heterogeneous thyroid with multiple underlying small nodules. Upper chest: Mosaic attenuation in the lung apices, possibly air trapping. Review of the MIP images confirms the above findings CTA HEAD FINDINGS Anterior circulation: The internal carotid arteries are widely  patent from skull base to carotid termini. An infundibulum is noted at the left posterior communicating artery origin. ACAs and MCAs are patent without evidence of proximal branch occlusion or significant stenosis. No aneurysm is identified. Posterior circulation: The intracranial vertebral arteries are widely patent to the basilar. Patent PICA and SCA origins are identified bilaterally. The basilar artery is widely patent. There are small posterior communicating arteries bilaterally. PCAs are patent without evidence of significant stenosis. No aneurysm is identified. Venous sinuses: Patent. Anatomic variants: None. Review of the MIP images confirms the above findings IMPRESSION: 1. No emergent large vessel occlusion or significant arterial stenosis in the head and neck. 2. 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 3.  Aortic Atherosclerosis (ICD10-I70.0). Electronically Signed   By: Logan Bores M.D.   On: 05/10/2018 15:16   Mr Brain Wo Contrast  Result Date: 05/10/2018 CLINICAL DATA:  Initial evaluation for left-sided visual loss, possible stroke. EXAM: MRI HEAD WITHOUT CONTRAST MRA HEAD WITHOUT CONTRAST TECHNIQUE: Multiplanar, multiecho pulse sequences of the brain and surrounding structures were obtained without intravenous contrast. Angiographic images of the head were obtained using MRA technique without contrast. COMPARISON:  Prior CT and CTA from earlier the same day. FINDINGS: MRI HEAD FINDINGS Brain: Generalized age-related cerebral atrophy. Patchy T2/FLAIR hyperintensity within the periventricular and deep white matter both cerebral hemispheres most consistent with chronic microvascular ischemic disease. Chronic small vessel ischemic changes present within the pons as well. Overall, changes are mild for age. Superimposed remote lacunar infarct noted within the right periatrial white matter. Tiny remote left cerebellar infarct noted as well.  3 mm acute ischemic cortical infarct present at the right parietal lobe (series 2, image 43). Additional patchy approximate 1 cm cortical infarct seen slightly inferiorly at the right occipital lobe (series 2, image 33). Probable trace petechial hemorrhage about this infarct (series 8, image 16). No other evidence for acute or subacute ischemia. No other foci of susceptibility artifact to suggest acute or chronic intracranial hemorrhage. No mass lesion, midline shift or mass effect. No hydrocephalus. No extra-axial fluid collection. Pituitary gland within normal limits. Vascular: Major intracranial vascular flow voids maintained. Skull and upper cervical spine: Craniocervical junction within normal limits. Bone marrow signal intensity normal. No focal marrow replacing lesion. Scalp soft tissues unremarkable. Sinuses/Orbits: Globes and orbital soft tissues within normal limits. Paranasal sinuses are largely clear. Right maxillary sinus retention cyst noted. No mastoid effusion. Inner ear structures grossly normal. Other: None. MRA HEAD FINDINGS ANTERIOR CIRCULATION: Distal cervical segments  of the internal carotid arteries demonstrate symmetric antegrade flow. Petrous, cavernous, and supraclinoid segments patent without stenosis. ICA termini widely patent. A1 segments patent bilaterally. Normal anterior communicating artery. Anterior cerebral arteries widely patent to their distal aspects. M1 segments widely patent without stenosis. Normal MCA bifurcations. No proximal M2 occlusion. Distal MCA branches well perfused and symmetric. POSTERIOR CIRCULATION: Vertebral arteries patent to the vertebrobasilar junction without stenosis. Partially visualized posterior inferior cerebral arteries patent. Basilar widely patent to its distal aspect. Superior cerebral arteries patent bilaterally. Both of the posterior cerebral arteries primarily supplied via the basilar and are widely patent to their distal aspects without  stenosis. Mild distal small vessel atheromatous irregularity noted. No intracranial aneurysm. IMPRESSION: MRI HEAD IMPRESSION: 1. Too small acute ischemic cortical infarcts involving the right parieto-occipital region as above. Associated faint petechial hemorrhage without hemorrhagic transformation. 2. Underlying mild chronic microvascular ischemic disease. MRA HEAD IMPRESSION: 1. Normal MRI appearance of the medium and large sized vessels of the intracranial circulation. No large vessel occlusion. No hemodynamically significant or correctable stenosis. 2. Mild distal small vessel atheromatous irregularity. Electronically Signed   By: Jeannine Boga M.D.   On: 05/10/2018 23:35   Mr Jodene Nam Head/brain ZO Cm  Result Date: 05/10/2018 CLINICAL DATA:  Initial evaluation for left-sided visual loss, possible stroke. EXAM: MRI HEAD WITHOUT CONTRAST MRA HEAD WITHOUT CONTRAST TECHNIQUE: Multiplanar, multiecho pulse sequences of the brain and surrounding structures were obtained without intravenous contrast. Angiographic images of the head were obtained using MRA technique without contrast. COMPARISON:  Prior CT and CTA from earlier the same day. FINDINGS: MRI HEAD FINDINGS Brain: Generalized age-related cerebral atrophy. Patchy T2/FLAIR hyperintensity within the periventricular and deep white matter both cerebral hemispheres most consistent with chronic microvascular ischemic disease. Chronic small vessel ischemic changes present within the pons as well. Overall, changes are mild for age. Superimposed remote lacunar infarct noted within the right periatrial white matter. Tiny remote left cerebellar infarct noted as well. 3 mm acute ischemic cortical infarct present at the right parietal lobe (series 2, image 43). Additional patchy approximate 1 cm cortical infarct seen slightly inferiorly at the right occipital lobe (series 2, image 33). Probable trace petechial hemorrhage about this infarct (series 8, image 16). No  other evidence for acute or subacute ischemia. No other foci of susceptibility artifact to suggest acute or chronic intracranial hemorrhage. No mass lesion, midline shift or mass effect. No hydrocephalus. No extra-axial fluid collection. Pituitary gland within normal limits. Vascular: Major intracranial vascular flow voids maintained. Skull and upper cervical spine: Craniocervical junction within normal limits. Bone marrow signal intensity normal. No focal marrow replacing lesion. Scalp soft tissues unremarkable. Sinuses/Orbits: Globes and orbital soft tissues within normal limits. Paranasal sinuses are largely clear. Right maxillary sinus retention cyst noted. No mastoid effusion. Inner ear structures grossly normal. Other: None. MRA HEAD FINDINGS ANTERIOR CIRCULATION: Distal cervical segments of the internal carotid arteries demonstrate symmetric antegrade flow. Petrous, cavernous, and supraclinoid segments patent without stenosis. ICA termini widely patent. A1 segments patent bilaterally. Normal anterior communicating artery. Anterior cerebral arteries widely patent to their distal aspects. M1 segments widely patent without stenosis. Normal MCA bifurcations. No proximal M2 occlusion. Distal MCA branches well perfused and symmetric. POSTERIOR CIRCULATION: Vertebral arteries patent to the vertebrobasilar junction without stenosis. Partially visualized posterior inferior cerebral arteries patent. Basilar widely patent to its distal aspect. Superior cerebral arteries patent bilaterally. Both of the posterior cerebral arteries primarily supplied via the basilar and are widely patent to their distal aspects without  stenosis. Mild distal small vessel atheromatous irregularity noted. No intracranial aneurysm. IMPRESSION: MRI HEAD IMPRESSION: 1. Too small acute ischemic cortical infarcts involving the right parieto-occipital region as above. Associated faint petechial hemorrhage without hemorrhagic transformation. 2.  Underlying mild chronic microvascular ischemic disease. MRA HEAD IMPRESSION: 1. Normal MRI appearance of the medium and large sized vessels of the intracranial circulation. No large vessel occlusion. No hemodynamically significant or correctable stenosis. 2. Mild distal small vessel atheromatous irregularity. Electronically Signed   By: Jeannine Boga M.D.   On: 05/10/2018 23:35   Ct Head Code Stroke Wo Contrast  Result Date: 05/10/2018 CLINICAL DATA:  Code stroke. Loss of vision in LEFT eye while driving. This occurred at 1200 hours earlier today. Patient reported to be neurologically intact EXAM: CT HEAD WITHOUT CONTRAST TECHNIQUE: Contiguous axial images were obtained from the base of the skull through the vertex without intravenous contrast. COMPARISON:  None. FINDINGS: Brain: No evidence for acute infarction, hemorrhage, mass lesion, hydrocephalus, or extra-axial fluid. Normal for age cerebral volume. Slight hypoattenuation of white matter, likely small vessel disease. Suspected small chronic lacune, LEFT basal ganglia. Vascular: Calcification of the cavernous internal carotid arteries consistent with cerebrovascular atherosclerotic disease. No signs of intracranial large vessel occlusion. Skull: Calvarium intact Sinuses/Orbits: No acute finding. Other: None. ASPECTS [facility] Stroke Program Early CT Score) - Ganglionic level infarction (caudate, lentiform nuclei, internal capsule, insula, M1-M3 cortex): 7 - Supraganglionic infarction (M4-M6 cortex): 3 Total score (0-10 with 10 being normal): 10 IMPRESSION: 1. Atrophy and small vessel disease. No acute intracranial findings. 2. ASPECTS is 10. These results were called by telephone at the time of interpretation on 05/10/2018 at 2:44 pm to Dr. Lenise Arena , who verbally acknowledged these results. * Electronically Signed   By: Staci Righter M.D.   On: 05/10/2018 14:48    EKG:   Orders placed or performed in visit on 05/10/18  . EKG 12-Lead   . EKG 12-Lead      Management plans discussed with the patient, family and they are in agreement.  CODE STATUS:     Code Status Orders  (From admission, onward)         Start     Ordered   05/10/18 1650  Do not attempt resuscitation (DNR)  Continuous    Question Answer Comment  In the event of cardiac or respiratory ARREST Do not call a "code blue"   In the event of cardiac or respiratory ARREST Do not perform Intubation, CPR, defibrillation or ACLS   In the event of cardiac or respiratory ARREST Use medication by any route, position, wound care, and other measures to relive pain and suffering. May use oxygen, suction and manual treatment of airway obstruction as needed for comfort.      05/10/18 1649        Code Status History    Date Active Date Inactive Code Status Order ID Comments User Context   05/10/2018 1645 05/10/2018 1649 Full Code [id number]  Bettey Costa, MD ED    Advance Directive Documentation     Most Recent Value  Type of Advance Directive  Healthcare Power of [place], Living will  Pre-existing out of facility DNR order (yellow form or pink MOST form)  -  "MOST" Form in Place?  -      TOTAL TIME TAKING CARE OF THIS PATIENT: 40 minutes.    Avel Peace Freedom Peddy M.D on 05/11/2018 at 11:52 AM  Between 7am to 6pm - Pager - [phone]  After 6pm  go to [email or web address] - password [email or web address] [facility]  Office  [phone]  CC: Primary care physician; Crecencio Mc, MD   Note: This dictation was prepared with Dragon dictation along with smaller phrase technology. Any transcriptional errors that result from this process are unintentional.

## 2018-05-11 NOTE — Progress Notes (Signed)
OT Cancellation Note  Patient Details Name: Linda [name] MRN: [id number] DOB: 1933-06-25   Cancelled Treatment:    Reason Eval/Treat Not Completed: OT screened, no needs identified, will sign off  Patient was sitting edge of bed with family present when OT and PT arrived. Patient and family state she is at baseline and does not need OT services at this time. Patient was noted to independently stand and ambulate around the room. Patient and family educated that they could contact for a new OT evaluation if needs changed.   Amie Portland, OTR/L  Linda [name] L 05/11/2018, 12:32 PM

## 2018-05-11 NOTE — Progress Notes (Signed)
Family Meeting Note  Advance Directive:yes  Today a meeting took place with the Patient.  Patient is able to participate    The following clinical team members were present during this meeting:MD  The following were discussed:Patient's diagnosis: cva, Patient's progosis: Unable to determine and Goals for treatment: DNR  Additional follow-up to be provided: prn  Time spent during discussion:30 minutes  Gorden Harms, MD

## 2018-05-11 NOTE — Evaluation (Signed)
Physical Therapy Evaluation Patient Details Name: Linda [name] MRN: [id number] DOB: 10-Feb-1934 Today's Date: 05/11/2018   History of Present Illness  Patient is an 84 year old female admitted due to left eye vision loss. Patient has PMH to include DM, Afib, cardiomyopathy.   Clinical Impression  Patient received in room sitting up in bed family present. Reports her vision has returned to normal. Patient performed bed mobility and transfers independently. Ambulated without AD 200 feet and up/down 6 steps with single rail. Patient reports no difficulty during mobility, feels like her usual self.  No further PT needs required. Patient to return home independently.     Follow Up Recommendations No PT follow up    Equipment Recommendations  None recommended by PT    Recommendations for Other Services       Precautions / Restrictions Precautions Precautions: None Restrictions Weight Bearing Restrictions: No      Mobility  Bed Mobility Overal bed mobility: Independent                Transfers Overall transfer level: Independent               General transfer comment: patient performs bed mobility and transfers without assist.   Ambulation/Gait Ambulation/Gait assistance: Independent;Supervision Gait Distance (Feet): 200 Feet Assistive device: None Gait Pattern/deviations: WFL(Within Functional Limits) Gait velocity: normal      Stairs Stairs: Yes Stairs assistance: Independent;Supervision Stair Management: One rail Right;Alternating pattern Number of Stairs: 6    Wheelchair Mobility    Modified Rankin (Stroke Patients Only)       Balance Overall balance assessment: Independent                                           Pertinent Vitals/Pain Pain Assessment: No/denies pain    Home Living Family/patient expects to be discharged to:: Private residence Living Arrangements: Alone   Type of Home: House Home Access: Stairs to  enter   Technical brewer of Steps: 3-4 Home Layout: One level Home Equipment: None      Prior Function Level of Independence: Independent               Hand Dominance        Extremity/Trunk Assessment   Upper Extremity Assessment Upper Extremity Assessment: Overall WFL for tasks assessed    Lower Extremity Assessment Lower Extremity Assessment: Overall WFL for tasks assessed    Cervical / Trunk Assessment Cervical / Trunk Assessment: Normal  Communication   Communication: No difficulties  Cognition Arousal/Alertness: Awake/alert Behavior During Therapy: WFL for tasks assessed/performed Overall Cognitive Status: Within Functional Limits for tasks assessed                                        General Comments      Exercises     Assessment/Plan    PT Assessment Patent does not need any further PT services  PT Problem List         PT Treatment Interventions      PT Goals (Current goals can be found in the Care Plan section)  Acute Rehab PT Goals Patient Stated Goal: return home Time For Goal Achievement: 05/13/18 Potential to Achieve Goals: Good    Frequency     Barriers to discharge  Co-evaluation               AM-PAC PT "6 Clicks" Mobility  Outcome Measure Help needed turning from your back to your side while in a flat bed without using bedrails?: None Help needed moving from lying on your back to sitting on the side of a flat bed without using bedrails?: None Help needed moving to and from a bed to a chair (including a wheelchair)?: None Help needed standing up from a chair using your arms (e.g., wheelchair or bedside chair)?: None Help needed to walk in hospital room?: None Help needed climbing 3-5 steps with a railing? : None 6 Click Score: 24    End of Session Equipment Utilized During Treatment: Gait belt Activity Tolerance: Patient tolerated treatment well Patient left: in bed;with family/visitor  present;with call bell/phone within reach   PT Visit Diagnosis: Other symptoms and signs involving the nervous system (R29.898)    Time: 1140-1153 PT Time Calculation (min) (ACUTE ONLY): 13 min   Charges:   PT Evaluation $PT Eval Low Complexity: 1 Low          Gildo Crisco, PT, GCS 05/11/18,12:04 PM

## 2018-05-11 NOTE — Progress Notes (Addendum)
Subjective: Patient remains stable with no new stroke or strokelike symptoms.  Significant events noted overnight.  Objective: Current vital signs: BP 136/71 (BP Location: Left Arm)   Pulse (!) 59   Temp 97.9 F (36.6 C) (Oral)   Resp 17   Ht 5\' 3"  (1.6 m)   Wt 78.9 kg   SpO2 97%   BMI 30.82 kg/m  Vital signs in last 24 hours: Temp:  [97.5 F (36.4 C)-98.3 F (36.8 C)] 97.9 F (36.6 C) (11/23 0825) Pulse Rate:  [40-88] 59 (11/23 0944) Resp:  [15-24] 17 (11/23 0825) BP: (115-183)/(69-110) 136/71 (11/23 0944) SpO2:  [94 %-98 %] 97 % (11/23 0825) Weight:  [78.9 kg] 78.9 kg (11/22 1421)  Intake/Output from previous day: No intake/output data recorded. Intake/Output this shift: Total I/O In: 240 [P.O.:240] Out: -  Nutritional status:  Diet Order            Diet - low sodium heart healthy        Diet 2 gram sodium Room service appropriate? Yes; Fluid consistency: Thin  Diet effective now             Neurological Exam  Mental Status: Alert, oriented, thought content appropriate. Speech fluent without evidence of aphasia. Able to follow 3 step commands without difficulty. Attention span and concentration seemed appropriate  Cranial Nerves: II: Discs flat bilaterally; Visual fields grossly normal, pupils equal, round, reactive to light and accommodation III,IV, VI: ptosis not present, extra-ocular motions intact bilaterally V,VII: smile symmetric, facial light touch sensationintact VIII: hearing normal bilaterally IX,X: gag reflex present XI: bilateral shoulder shrug XII: midline tongue extension Motor: Right :Upper extremity 5/5Without pronator driftLeft: Upper extremity 5/5 without pronator drift Right:Lower extremity 5/5Left: Lower extremity 5/5 Tone and bulk:normal tone throughout; no atrophy noted Sensory: Pinprick and light touchintact bilaterally Deep Tendon Reflexes: 2+ and symmetric  throughout Plantars: Right:muteLeft: mute Cerebellar: Finger-to-nosetesting intact bilaterally.Heel to shin testing normal bilaterally Gait: not tested due to safety concerns  Data Reviewed  Lab Results: Basic Metabolic Panel: Recent Labs  Lab 05/10/18 1441  NA 139  K 3.7  CL 100  CO2 27  GLUCOSE 182*  BUN 25*  CREATININE 0.80  CALCIUM 9.3    Liver Function Tests: Recent Labs  Lab 05/10/18 1441  AST 29  ALT 27  ALKPHOS 58  BILITOT 1.5*  PROT 6.5  ALBUMIN 3.8   No results for input(s): LIPASE, AMYLASE in the last 168 hours. No results for input(s): AMMONIA in the last 168 hours.  CBC: Recent Labs  Lab 05/10/18 1441  WBC 6.8  NEUTROABS 5.3  HGB 16.0*  HCT 48.3*  MCV 95.5  PLT 197    Cardiac Enzymes: Recent Labs  Lab 05/10/18 1441  TROPONINI <0.03    Lipid Panel: Recent Labs  Lab 05/11/18 0422  CHOL 194  TRIG 150*  HDL 68  CHOLHDL 2.9  VLDL 30  LDLCALC 96    CBG: Recent Labs  Lab 05/10/18 1434  GLUCAP 159*    Microbiology: Results for orders placed or performed in visit on 11/15/17  Microscopic Examination     Status: Abnormal   Collection Time: 11/15/17  1:58 PM  Result Value Ref Range Status   WBC, UA >30 (H) 0 - 5 /hpf Final   RBC, UA 3-10 (A) 0 - 2 /hpf Final   Epithelial Cells (non renal) 0-10 0 - 10 /hpf Final   Casts Present (A) None seen /lpf Final   Cast Type Hyaline casts N/A  Final   Mucus, UA Present (A) Not Estab. Final   Bacteria, UA Many (A) None seen/Few Final  CULTURE, URINE COMPREHENSIVE     Status: Abnormal   Collection Time: 11/15/17  2:50 PM  Result Value Ref Range Status   Urine Culture, Comprehensive Final report (A)  Final   Organism ID, Bacteria Escherichia coli (A)  Final    Comment: Greater than 100,000 colony forming units per mL Cefazolin <=4 ug/mL Cefazolin with an MIC <=16 predicts susceptibility to the oral agents cefaclor, cefdinir, cefpodoxime, cefprozil,  cefuroxime, cephalexin, and loracarbef when used for therapy of uncomplicated urinary tract infections due to E. coli, Klebsiella pneumoniae, and Proteus mirabilis.    ANTIMICROBIAL SUSCEPTIBILITY Comment  Final    Comment:       ** S = Susceptible; I = Intermediate; R = Resistant **                    P = Positive; N = Negative             MICS are expressed in micrograms per mL    Antibiotic                 RSLT#1    RSLT#2    RSLT#3    RSLT#4 Amoxicillin/Clavulanic Acid    S Ampicillin                     R Cefepime                       S Ceftriaxone                    S Cefuroxime                     S Ciprofloxacin                  R Ertapenem                      S Gentamicin                     S Imipenem                       S Levofloxacin                   R Meropenem                      S Nitrofurantoin                 S Piperacillin/Tazobactam        S Tetracycline                   S Tobramycin                     S Trimethoprim/Sulfa             S     Coagulation Studies: Recent Labs    05/10/18 1441 05/11/18 0422  LABPROT 19.3* 20.5*  INR 1.65 1.78    Imaging: Ct Angio Head W Or Wo Contrast  Result Date: 05/10/2018 CLINICAL DATA:  Complete left eye visual loss while driving today. EXAM: CT ANGIOGRAPHY HEAD AND NECK TECHNIQUE: Multidetector CT imaging of the head and neck was performed using the standard  protocol during bolus administration of intravenous contrast. Multiplanar CT image reconstructions and MIPs were obtained to evaluate the vascular anatomy. Carotid stenosis measurements (when applicable) are obtained utilizing NASCET criteria, using the distal internal carotid diameter as the denominator. CONTRAST:  52mL OMNIPAQUE IOHEXOL 350 MG/ML SOLN COMPARISON:  None. FINDINGS: CTA NECK FINDINGS Aortic arch: Normal variant aortic arch branching pattern with common origin of the brachiocephalic and left common carotid arteries. Mild arch atherosclerosis.  Widely patent arch vessel origins. Right carotid system: Patent without evidence of stenosis or dissection. Tortuous proximal cervical ICA with fusiform aneurysmal dilatation over a length of 2 cm to a maximal diameter of 10 mm. Left carotid system: Patent without evidence of stenosis or dissection. Tortuous proximal to mid cervical ICA which is mildly ectatic with a maximal diameter of 7 mm. Vertebral arteries: Patent and codominant without evidence of stenosis or dissection. Skeleton: Mild cervical disc degeneration. Other neck: Diffusely heterogeneous thyroid with multiple underlying small nodules. Upper chest: Mosaic attenuation in the lung apices, possibly air trapping. Review of the MIP images confirms the above findings CTA HEAD FINDINGS Anterior circulation: The internal carotid arteries are widely patent from skull base to carotid termini. An infundibulum is noted at the left posterior communicating artery origin. ACAs and MCAs are patent without evidence of proximal branch occlusion or significant stenosis. No aneurysm is identified. Posterior circulation: The intracranial vertebral arteries are widely patent to the basilar. Patent PICA and SCA origins are identified bilaterally. The basilar artery is widely patent. There are small posterior communicating arteries bilaterally. PCAs are patent without evidence of significant stenosis. No aneurysm is identified. Venous sinuses: Patent. Anatomic variants: None. Review of the MIP images confirms the above findings IMPRESSION: 1. No emergent large vessel occlusion or significant arterial stenosis in the head and neck. 2. 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 3.  Aortic Atherosclerosis (ICD10-I70.0). Electronically Signed   By: Logan Bores M.D.   On: 05/10/2018 15:16   Ct Angio Neck W Or Wo Contrast  Result Date: 05/10/2018 CLINICAL DATA:  Complete left eye visual loss while driving today.  EXAM: CT ANGIOGRAPHY HEAD AND NECK TECHNIQUE: Multidetector CT imaging of the head and neck was performed using the standard protocol during bolus administration of intravenous contrast. Multiplanar CT image reconstructions and MIPs were obtained to evaluate the vascular anatomy. Carotid stenosis measurements (when applicable) are obtained utilizing NASCET criteria, using the distal internal carotid diameter as the denominator. CONTRAST:  64mL OMNIPAQUE IOHEXOL 350 MG/ML SOLN COMPARISON:  None. FINDINGS: CTA NECK FINDINGS Aortic arch: Normal variant aortic arch branching pattern with common origin of the brachiocephalic and left common carotid arteries. Mild arch atherosclerosis. Widely patent arch vessel origins. Right carotid system: Patent without evidence of stenosis or dissection. Tortuous proximal cervical ICA with fusiform aneurysmal dilatation over a length of 2 cm to a maximal diameter of 10 mm. Left carotid system: Patent without evidence of stenosis or dissection. Tortuous proximal to mid cervical ICA which is mildly ectatic with a maximal diameter of 7 mm. Vertebral arteries: Patent and codominant without evidence of stenosis or dissection. Skeleton: Mild cervical disc degeneration. Other neck: Diffusely heterogeneous thyroid with multiple underlying small nodules. Upper chest: Mosaic attenuation in the lung apices, possibly air trapping. Review of the MIP images confirms the above findings CTA HEAD FINDINGS Anterior circulation: The internal carotid arteries are widely patent from skull base to carotid termini. An infundibulum is noted at the left  posterior communicating artery origin. ACAs and MCAs are patent without evidence of proximal branch occlusion or significant stenosis. No aneurysm is identified. Posterior circulation: The intracranial vertebral arteries are widely patent to the basilar. Patent PICA and SCA origins are identified bilaterally. The basilar artery is widely patent. There are  small posterior communicating arteries bilaterally. PCAs are patent without evidence of significant stenosis. No aneurysm is identified. Venous sinuses: Patent. Anatomic variants: None. Review of the MIP images confirms the above findings IMPRESSION: 1. No emergent large vessel occlusion or significant arterial stenosis in the head and neck. 2. 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 3.  Aortic Atherosclerosis (ICD10-I70.0). Electronically Signed   By: Logan Bores M.D.   On: 05/10/2018 15:16   Mr Brain Wo Contrast  Result Date: 05/10/2018 CLINICAL DATA:  Initial evaluation for left-sided visual loss, possible stroke. EXAM: MRI HEAD WITHOUT CONTRAST MRA HEAD WITHOUT CONTRAST TECHNIQUE: Multiplanar, multiecho pulse sequences of the brain and surrounding structures were obtained without intravenous contrast. Angiographic images of the head were obtained using MRA technique without contrast. COMPARISON:  Prior CT and CTA from earlier the same day. FINDINGS: MRI HEAD FINDINGS Brain: Generalized age-related cerebral atrophy. Patchy T2/FLAIR hyperintensity within the periventricular and deep white matter both cerebral hemispheres most consistent with chronic microvascular ischemic disease. Chronic small vessel ischemic changes present within the pons as well. Overall, changes are mild for age. Superimposed remote lacunar infarct noted within the right periatrial white matter. Tiny remote left cerebellar infarct noted as well. 3 mm acute ischemic cortical infarct present at the right parietal lobe (series 2, image 43). Additional patchy approximate 1 cm cortical infarct seen slightly inferiorly at the right occipital lobe (series 2, image 33). Probable trace petechial hemorrhage about this infarct (series 8, image 16). No other evidence for acute or subacute ischemia. No other foci of susceptibility artifact to suggest acute or chronic intracranial  hemorrhage. No mass lesion, midline shift or mass effect. No hydrocephalus. No extra-axial fluid collection. Pituitary gland within normal limits. Vascular: Major intracranial vascular flow voids maintained. Skull and upper cervical spine: Craniocervical junction within normal limits. Bone marrow signal intensity normal. No focal marrow replacing lesion. Scalp soft tissues unremarkable. Sinuses/Orbits: Globes and orbital soft tissues within normal limits. Paranasal sinuses are largely clear. Right maxillary sinus retention cyst noted. No mastoid effusion. Inner ear structures grossly normal. Other: None. MRA HEAD FINDINGS ANTERIOR CIRCULATION: Distal cervical segments of the internal carotid arteries demonstrate symmetric antegrade flow. Petrous, cavernous, and supraclinoid segments patent without stenosis. ICA termini widely patent. A1 segments patent bilaterally. Normal anterior communicating artery. Anterior cerebral arteries widely patent to their distal aspects. M1 segments widely patent without stenosis. Normal MCA bifurcations. No proximal M2 occlusion. Distal MCA branches well perfused and symmetric. POSTERIOR CIRCULATION: Vertebral arteries patent to the vertebrobasilar junction without stenosis. Partially visualized posterior inferior cerebral arteries patent. Basilar widely patent to its distal aspect. Superior cerebral arteries patent bilaterally. Both of the posterior cerebral arteries primarily supplied via the basilar and are widely patent to their distal aspects without stenosis. Mild distal small vessel atheromatous irregularity noted. No intracranial aneurysm. IMPRESSION: MRI HEAD IMPRESSION: 1. Too small acute ischemic cortical infarcts involving the right parieto-occipital region as above. Associated faint petechial hemorrhage without hemorrhagic transformation. 2. Underlying mild chronic microvascular ischemic disease. MRA HEAD IMPRESSION: 1. Normal MRI appearance of the medium and large sized  vessels of the intracranial circulation. No large vessel occlusion. No  hemodynamically significant or correctable stenosis. 2. Mild distal small vessel atheromatous irregularity. Electronically Signed   By: Jeannine Boga M.D.   On: 05/10/2018 23:35   Mr Jodene Nam Head/brain QI Cm  Result Date: 05/10/2018 CLINICAL DATA:  Initial evaluation for left-sided visual loss, possible stroke. EXAM: MRI HEAD WITHOUT CONTRAST MRA HEAD WITHOUT CONTRAST TECHNIQUE: Multiplanar, multiecho pulse sequences of the brain and surrounding structures were obtained without intravenous contrast. Angiographic images of the head were obtained using MRA technique without contrast. COMPARISON:  Prior CT and CTA from earlier the same day. FINDINGS: MRI HEAD FINDINGS Brain: Generalized age-related cerebral atrophy. Patchy T2/FLAIR hyperintensity within the periventricular and deep white matter both cerebral hemispheres most consistent with chronic microvascular ischemic disease. Chronic small vessel ischemic changes present within the pons as well. Overall, changes are mild for age. Superimposed remote lacunar infarct noted within the right periatrial white matter. Tiny remote left cerebellar infarct noted as well. 3 mm acute ischemic cortical infarct present at the right parietal lobe (series 2, image 43). Additional patchy approximate 1 cm cortical infarct seen slightly inferiorly at the right occipital lobe (series 2, image 33). Probable trace petechial hemorrhage about this infarct (series 8, image 16). No other evidence for acute or subacute ischemia. No other foci of susceptibility artifact to suggest acute or chronic intracranial hemorrhage. No mass lesion, midline shift or mass effect. No hydrocephalus. No extra-axial fluid collection. Pituitary gland within normal limits. Vascular: Major intracranial vascular flow voids maintained. Skull and upper cervical spine: Craniocervical junction within normal limits. Bone marrow signal  intensity normal. No focal marrow replacing lesion. Scalp soft tissues unremarkable. Sinuses/Orbits: Globes and orbital soft tissues within normal limits. Paranasal sinuses are largely clear. Right maxillary sinus retention cyst noted. No mastoid effusion. Inner ear structures grossly normal. Other: None. MRA HEAD FINDINGS ANTERIOR CIRCULATION: Distal cervical segments of the internal carotid arteries demonstrate symmetric antegrade flow. Petrous, cavernous, and supraclinoid segments patent without stenosis. ICA termini widely patent. A1 segments patent bilaterally. Normal anterior communicating artery. Anterior cerebral arteries widely patent to their distal aspects. M1 segments widely patent without stenosis. Normal MCA bifurcations. No proximal M2 occlusion. Distal MCA branches well perfused and symmetric. POSTERIOR CIRCULATION: Vertebral arteries patent to the vertebrobasilar junction without stenosis. Partially visualized posterior inferior cerebral arteries patent. Basilar widely patent to its distal aspect. Superior cerebral arteries patent bilaterally. Both of the posterior cerebral arteries primarily supplied via the basilar and are widely patent to their distal aspects without stenosis. Mild distal small vessel atheromatous irregularity noted. No intracranial aneurysm. IMPRESSION: MRI HEAD IMPRESSION: 1. Too small acute ischemic cortical infarcts involving the right parieto-occipital region as above. Associated faint petechial hemorrhage without hemorrhagic transformation. 2. Underlying mild chronic microvascular ischemic disease. MRA HEAD IMPRESSION: 1. Normal MRI appearance of the medium and large sized vessels of the intracranial circulation. No large vessel occlusion. No hemodynamically significant or correctable stenosis. 2. Mild distal small vessel atheromatous irregularity. Electronically Signed   By: Jeannine Boga M.D.   On: 05/10/2018 23:35   Ct Head Code Stroke Wo Contrast  Result  Date: 05/10/2018 CLINICAL DATA:  Code stroke. Loss of vision in LEFT eye while driving. This occurred at 1200 hours earlier today. Patient reported to be neurologically intact EXAM: CT HEAD WITHOUT CONTRAST TECHNIQUE: Contiguous axial images were obtained from the base of the skull through the vertex without intravenous contrast. COMPARISON:  None. FINDINGS: Brain: No evidence for acute infarction, hemorrhage, mass lesion, hydrocephalus, or extra-axial fluid. Normal for age cerebral  volume. Slight hypoattenuation of white matter, likely small vessel disease. Suspected small chronic lacune, LEFT basal ganglia. Vascular: Calcification of the cavernous internal carotid arteries consistent with cerebrovascular atherosclerotic disease. No signs of intracranial large vessel occlusion. Skull: Calvarium intact Sinuses/Orbits: No acute finding. Other: None. ASPECTS [facility] Stroke Program Early CT Score) - Ganglionic level infarction (caudate, lentiform nuclei, internal capsule, insula, M1-M3 cortex): 7 - Supraganglionic infarction (M4-M6 cortex): 3 Total score (0-10 with 10 being normal): 10 IMPRESSION: 1. Atrophy and small vessel disease. No acute intracranial findings. 2. ASPECTS is 10. These results were called by telephone at the time of interpretation on 05/10/2018 at 2:44 pm to Dr. Lenise Arena , who verbally acknowledged these results. * Electronically Signed   By: Staci Righter M.D.   On: 05/10/2018 14:48    Medications:  I have reviewed the patient's current medications. Prior to Admission:  Medications Prior to Admission  Medication Sig Dispense Refill Last Dose  . ALPRAZolam (XANAX) 0.25 MG tablet Take 1 tablet (0.25 mg total) by mouth 2 (two) times daily as needed for anxiety. 30 tablet 1 Unknown at PRN  . Cholecalciferol (VITAMIN D) 2000 units tablet Take 2,000 Units by mouth daily.   05/10/2018 at 0800  . COUMADIN 5 MG tablet Take as directed by coumadin clinic (Patient taking differently: Take  2.5-5 mg by mouth See admin instructions. Take 1 tablet (5MG ) by mouth daily every Monday, Tuesday, Thursday, Friday, Saturday and Sunday and take  tablet (2.5MG ) by mouth on Wednesday) 90 tablet 1 05/10/2018 at 1330  . escitalopram (LEXAPRO) 10 MG tablet Take 1 tablet (10 mg total) by mouth daily. 30 tablet 2 05/10/2018 at 0800  . estradiol (ESTRACE) 0.1 MG/GM vaginal cream Place 1 g vaginally 3 (three) times a week. Pea sized amount per urethra 42.5 g 12 As directed at As directed  . feeding supplement (BOOST HIGH PROTEIN) LIQD Take 1 Container by mouth daily.   As directed at As directed  . losartan (COZAAR) 50 MG tablet TAKE 2 TABLETS (100 MG TOTAL) BY MOUTH DAILY. 180 tablet 1 05/10/2018 at 0800  . metroNIDAZOLE (METROCREAM) 0.75 % cream Apply 1 application topically 2 (two) times daily.   5 As directed at As directed  . omeprazole (PRILOSEC) 40 MG capsule Take 40 mg by mouth daily.   05/10/2018 at 0800  . ondansetron (ZOFRAN) 4 MG tablet Take 4 mg by mouth every 8 (eight) hours as needed for nausea.   0 Unknown at PRN  . doxycycline (VIBRA-TABS) 100 MG tablet Take 1 tablet (100 mg total) by mouth 2 (two) times daily. (Patient not taking: Reported on 05/10/2018) 14 tablet 0 Not Taking at 0800  . predniSONE (DELTASONE) 10 MG tablet 6 tablets on Day 1 , then reduce by 1 tablet daily until gone (Patient not taking: Reported on 05/10/2018) 21 tablet 0 Not Taking at Unknown time   Scheduled: . aspirin  300 mg Rectal Daily   Or  . aspirin  325 mg Oral Daily  . cholecalciferol  2,000 Units Oral Daily  . escitalopram  10 mg Oral Daily  . feeding supplement  1 Container Oral Q24H  . losartan  100 mg Oral Daily  . pravastatin  20 mg Oral q1800  . warfarin  5 mg Oral ONCE-1800  . Warfarin - Pharmacist Dosing Inpatient   Does not apply q1800    Assessment: 84 y.o. female with past medical history of diabetes mellitus type ll, hypertension, idiopathic cardiomyopathy, atrial fibrillation on coumadin  presenting to the ED with sudden loss vision in the left eye.  CT head did not show acute intracranial abnormalities. Follow up CTA head and neck did not show emergent large vessel occlusion or significant arterial stenosis. Right ICA aneurysm noted. MRI of the brain reviewed and showed small acute ischemic cortical infarcts involving the right periatrial occipital region.  Etiology likely embolic. Patient has history of atrial fibrillation on Coumadin which was stopped for 3 days due to being on antibiotics doxycycline.  INR noted to be subtherapeutic at 1.6 now improved to 1.78.  Hemoglobin A1c 6.3 LDL 96. Patient prefers to discuss with her cardiology to consider alternative anticoagulation with either Eliquis or Pradaxa.  She does not wish to start this therapy during this admission.  Plan: 1. Prophylactic therapy-Continue Coumadin with goal INR between 2 and 3.  2. Statin with goal low density lipoprotein (LDL) <70 mg/dl 3. Liberal blood pressure with goal to keep  systolic BP (SBP) <010 mm Hg (130 mm Hg if diabetic) 4.  Recommend follow-up with cardiology for management of A. fib.  This patient was staffed with Dr. Irish Elders, Alease Frame who personally evaluated patient, reviewed documentation and agreed with assessment and plan of care as above.  Rufina Falco, DNP, FNP-BC Board certified Nurse Practitioner Neurology Department    LOS: 1 day   @MPKSIGN @ 05/11/2018  1:44 PM

## 2018-05-14 ENCOUNTER — Telehealth: Payer: Self-pay | Admitting: Internal Medicine

## 2018-05-14 NOTE — Telephone Encounter (Signed)
Transition Care Management Follow-up Telephone Call  How have you been since you were released from the hospital? Patient says she is feeling fine, glad to be home.   Do you understand why you were in the hospital? yes   Do you understand the discharge instrcutions? yes  Items Reviewed:  Medications reviewed: yes  Allergies reviewed: yes  Dietary changes reviewed: yes  Referrals reviewed: yes   Functional Questionnaire:   Activities of Daily Living (ADLs):   She states they are independent in the following: ambulation, bathing and hygiene, feeding, continence, grooming, toileting and dressing States they require assistance with the following: No assistance needed at this time.   Any transportation issues/concerns?: no   Any patient concerns? no   Confirmed importance and date/time of follow-up visits scheduled: yes   Confirmed with patient if condition begins to worsen call PCP or go to the ER.  Patient was given the Call-a-Nurse line [phone]: yes

## 2018-05-14 NOTE — Telephone Encounter (Signed)
FYI

## 2018-05-14 NOTE — Telephone Encounter (Signed)
FYI, I called pt to schedule the VAS US carotid, pt states she had a TIA on 05/10/2018 and had all of the labs and US done. Results should be ready to view in pt chart. Thank you!

## 2018-05-21 ENCOUNTER — Other Ambulatory Visit: Payer: Self-pay | Admitting: Nurse Practitioner

## 2018-05-21 ENCOUNTER — Ambulatory Visit
Admission: RE | Admit: 2018-05-21 | Discharge: 2018-05-21 | Disposition: A | Discharge status: Home / Self Care | Payer: Medicare Other | Source: Ambulatory Visit | Attending: Internal Medicine | Admitting: Internal Medicine

## 2018-05-21 DIAGNOSIS — R918 Other nonspecific abnormal finding of lung field: Secondary | ICD-10-CM | POA: Insufficient documentation

## 2018-05-21 DIAGNOSIS — K2100 Gastro-esophageal reflux disease with esophagitis, without bleeding: Secondary | ICD-10-CM

## 2018-05-21 DIAGNOSIS — R490 Dysphonia: Secondary | ICD-10-CM

## 2018-05-21 DIAGNOSIS — R61 Generalized hyperhidrosis: Secondary | ICD-10-CM | POA: Diagnosis present

## 2018-05-22 ENCOUNTER — Other Ambulatory Visit: Payer: Self-pay | Admitting: Internal Medicine

## 2018-05-22 MED ORDER — FUROSEMIDE 20 MG PO TABS: 20.0000 mg | ORAL_TABLET | Freq: Every day | ORAL | 0 refills | Status: DC

## 2018-05-22 NOTE — Progress Notes (Signed)
Furosemide added 

## 2018-05-23 ENCOUNTER — Encounter: Payer: Self-pay | Admitting: Internal Medicine

## 2018-05-23 ENCOUNTER — Ambulatory Visit (INDEPENDENT_AMBULATORY_CARE_PROVIDER_SITE_OTHER): Payer: Medicare Other | Admitting: Internal Medicine

## 2018-05-23 VITALS — BP 134/82 | HR 65 | Temp 97.5°F | Resp 14 | Ht 63.0 in | Wt 177.2 lb

## 2018-05-23 DIAGNOSIS — Z09 Encounter for follow-up examination after completed treatment for conditions other than malignant neoplasm: Secondary | ICD-10-CM | POA: Diagnosis not present

## 2018-05-23 DIAGNOSIS — E7800 Pure hypercholesterolemia, unspecified: Secondary | ICD-10-CM | POA: Diagnosis not present

## 2018-05-23 DIAGNOSIS — I10 Essential (primary) hypertension: Secondary | ICD-10-CM

## 2018-05-23 DIAGNOSIS — I63431 Cerebral infarction due to embolism of right posterior cerebral artery: Secondary | ICD-10-CM

## 2018-05-23 DIAGNOSIS — Z8673 Personal history of transient ischemic attack (TIA), and cerebral infarction without residual deficits: Secondary | ICD-10-CM

## 2018-05-23 DIAGNOSIS — R9389 Abnormal findings on diagnostic imaging of other specified body structures: Secondary | ICD-10-CM

## 2018-05-23 NOTE — Progress Notes (Signed)
Subjective:  Patient ID: Linda [name], female    DOB: [birth year]  Age: 84 y.o. MRN: [id number]  CC: There were no encounter diagnoses.  HPI Linda [name] presents for hospital  follow up. She was admitted to [facility] on Nov 22 for  TIA.  She presented with symptoms of vision loss involving the left eye on Nov 22  And underwent imaging with MRI which diagnosed an acute right punctateinfarct. She has a history of chronic atrial fibrillation and chronic anticoagulation with coumadin.  However her INR was subtherapeutic at the time of admission,  Because her dose has been reduced due concurrent use of an antibiotic.  She was seen by neurology and offered an alternative to coumadin which she declined due to concerns about lack of available reversal agents for the other options.  Her left eye vision loss resolved prior to discharge on Nov 23 .  She has not been referred to Neurology as an outpatient yet.    She has many many questions about her hospitalization and her  Recent chest CT   A total of 45 minutes was spent with patient today reviewing her hospitalization as well as the workups of the many proelms she listed at her last visit one month ago.   GERD:  She has been referred to GI for evaluation of persistent symptoms.  Has the upper GI and barium swallow scheduled for  next week   Cough, night sweats: : she has a chest xray to evaluate  night sweats, persistent cough.   recent finding of  interstitial edema on CT which was done to evaluate a right mid lung zone seen on chest x ray .    Hypertension:  She was advised to suspend her bp medications until follow up.  She has resumed losartan at a dose of 25 mg twice daily for the past week   Needs neurology referral to Dr Manuella Ghazi.   Has been getting the run around from the front office trying  to reach [place] Medications Prior to Visit  Medication Sig Dispense Refill  . ALPRAZolam (XANAX) 0.25 MG tablet Take 1 tablet (0.25 mg total) by  mouth 2 (two) times daily as needed for anxiety. 30 tablet 1  . Cholecalciferol (VITAMIN D) 2000 units tablet Take 2,000 Units by mouth daily.    Marland Kitchen COUMADIN 5 MG tablet Take as directed by coumadin clinic (Patient taking differently: Take 2.5-5 mg by mouth See admin instructions. Take 1 tablet (5MG ) by mouth daily every Monday, Tuesday, Thursday, Friday, Saturday and Sunday and take  tablet (2.5MG ) by mouth on Wednesday) 90 tablet 1  . estradiol (ESTRACE) 0.1 MG/GM vaginal cream Place 1 g vaginally 3 (three) times a week. Pea sized amount per urethra 42.5 g 12  . famotidine (PEPCID) 40 MG tablet     . feeding supplement (BOOST HIGH PROTEIN) LIQD Take 1 Container by mouth daily.    . furosemide (LASIX) 20 MG tablet Take 1 tablet (20 mg total) by mouth daily. 30 tablet 0  . losartan (COZAAR) 50 MG tablet TAKE 2 TABLETS (100 MG TOTAL) BY MOUTH DAILY. 180 tablet 1  . metroNIDAZOLE (METROCREAM) 0.75 % cream Apply 1 application topically 2 (two) times daily.   5  . omeprazole (PRILOSEC) 40 MG capsule Take 40 mg by mouth daily.    . ondansetron (ZOFRAN) 4 MG tablet Take 4 mg by mouth every 8 (eight) hours as needed for nausea.   0  . pravastatin (  PRAVACHOL) 20 MG tablet Take 1 tablet (20 mg total) by mouth daily at 6 PM. 90 tablet 0  . escitalopram (LEXAPRO) 10 MG tablet Take 1 tablet (10 mg total) by mouth daily. (Patient not taking: Reported on 05/23/2018) 30 tablet 2  . doxycycline (VIBRA-TABS) 100 MG tablet Take 1 tablet (100 mg total) by mouth 2 (two) times daily. (Patient not taking: Reported on 05/10/2018) 14 tablet 0  . predniSONE (DELTASONE) 10 MG tablet 6 tablets on Day 1 , then reduce by 1 tablet daily until gone (Patient not taking: Reported on 05/10/2018) 21 tablet 0   No facility-administered medications prior to visit.     Review of Systems;  Patient denies headache, fevers, malaise, unintentional weight loss, skin rash, eye pain, sinus congestion and sinus pain, sore throat, dysphagia,   hemoptysis , cough, dyspnea, wheezing, chest pain, palpitations, orthopnea, edema, abdominal pain, nausea, melena, diarrhea, constipation, flank pain, dysuria, hematuria, urinary  Frequency, nocturia, numbness, tingling, seizures,  Focal weakness, Loss of consciousness,  Tremor, insomnia, depression, anxiety, and suicidal ideation.      Objective:  BP 134/82 (BP Location: Left Arm, Patient Position: Sitting, Cuff Size: Normal)   Pulse 65   Temp (!) 97.5 F (36.4 C) (Oral)   Resp 14   Ht 5\' 3"  (1.6 m)   Wt 177 lb 3.2 oz (80.4 kg)   SpO2 95%   BMI 31.39 kg/m   BP Readings from Last 3 Encounters:  05/23/18 134/82  05/11/18 102/65  05/02/18 (!) 142/82    Wt Readings from Last 3 Encounters:  05/23/18 177 lb 3.2 oz (80.4 kg)  05/10/18 174 lb (78.9 kg)  05/02/18 176 lb (79.8 kg)    General appearance: alert, cooperative and appears stated age Ears: normal TM's and external ear canals both ears Throat: lips, mucosa, and tongue normal; teeth and gums normal Neck: no adenopathy, no carotid bruit, supple, symmetrical, trachea midline and thyroid not enlarged, symmetric, no tenderness/mass/nodules Back: symmetric, no curvature. ROM normal. No CVA tenderness. Lungs: clear to auscultation bilaterally Heart: regular rate and rhythm, S1, S2 normal, no murmur, click, rub or gallop Abdomen: soft, non-tender; bowel sounds normal; no masses,  no organomegaly Pulses: 2+ and symmetric Skin: Skin color, texture, turgor normal. No rashes or lesions Lymph nodes: Cervical, supraclavicular, and axillary nodes normal.  Lab Results  Component Value Date   HGBA1C 6.3 (H) 05/11/2018   HGBA1C 6.3 (A) 02/07/2018   HGBA1C 6.5 11/06/2017    Lab Results  Component Value Date   CREATININE 0.80 05/10/2018   CREATININE 0.90 02/07/2018   CREATININE 0.75 11/06/2017    Lab Results  Component Value Date   WBC 6.8 05/10/2018   HGB 16.0 (H) 05/10/2018   HCT 48.3 (H) 05/10/2018   PLT 197 05/10/2018    GLUCOSE 182 (H) 05/10/2018   CHOL 194 05/11/2018   TRIG 150 (H) 05/11/2018   HDL 68 05/11/2018   LDLDIRECT 90.0 10/23/2016   LDLCALC 96 05/11/2018   ALT 27 05/10/2018   AST 29 05/10/2018   NA 139 05/10/2018   K 3.7 05/10/2018   CL 100 05/10/2018   CREATININE 0.80 05/10/2018   BUN 25 (H) 05/10/2018   CO2 27 05/10/2018   TSH 1.48 04/27/2016   INR 1.78 05/11/2018   HGBA1C 6.3 (H) 05/11/2018   MICROALBUR 12.1 (H) 04/30/2017    Ct Chest Wo Contrast  Result Date: 05/21/2018 CLINICAL DATA:  84 year old female with history of persistent cough at night for 1 year. Shortness of  breath on exertion. EXAM: CT CHEST WITHOUT CONTRAST TECHNIQUE: Multidetector CT imaging of the chest was performed following the standard protocol without IV contrast. COMPARISON:  Unremarkable. FINDINGS: Cardiovascular: Heart size is mildly enlarged. There is no significant pericardial fluid, thickening or pericardial calcification. There is aortic atherosclerosis, as well as atherosclerosis of the great vessels of the mediastinum and the coronary arteries, including calcified atherosclerotic plaque in the left main, left anterior descending and right coronary arteries. Calcifications of the mitral annulus. Mediastinum/Nodes: No pathologically enlarged mediastinal or hilar lymph nodes. Esophagus is unremarkable in appearance. No axillary lymphadenopathy. Lungs/Pleura: High-resolution images were not obtained. With these limitations in mind, there is widespread ground-glass attenuation and mild interlobular septal thickening throughout the lungs bilaterally. No traction bronchiectasis or honeycombing noted. No acute consolidative airspace disease. No pleural effusions. Upper Abdomen: Aortic atherosclerosis. Status post cholecystectomy. Numerous colonic diverticulae are noted in the region of the splenic flexure of the colon. Musculoskeletal: There are no aggressive appearing lytic or blastic lesions noted in the visualized  portions of the skeleton. IMPRESSION: 1. Mild diffuse ground-glass attenuation and interlobular septal thickening throughout the lungs bilaterally. Given the cardiomegaly, these findings may simply reflect interstitial pulmonary edema in the setting of congestive heart failure. The possibility of interstitial lung disease is not entirely excluded, but is not strongly favored. If there is clinical concern for interstitial lung disease, follow-up nonemergent high-resolution chest CT is suggested in the next 3-6 months after optimization of cardiovascular status to re-evaluate these findings. 2. Aortic atherosclerosis, in addition to left main and 2 vessel coronary artery disease. 3. There are calcifications of the mitral valve. Echocardiographic correlation for evaluation of potential valvular dysfunction may be warranted if clinically indicated. Aortic Atherosclerosis (ICD10-I70.0). Electronically Signed   By: Vinnie Langton M.D.   On: 05/21/2018 15:34    Assessment & Plan:   Problem List Items Addressed This Visit    None      I have discontinued Gracelyn M. Keathley's doxycycline and predniSONE. I am also having her maintain her COUMADIN, Vitamin D, feeding supplement, estradiol, metroNIDAZOLE, losartan, ALPRAZolam, ondansetron, escitalopram, omeprazole, pravastatin, furosemide, and famotidine.  No orders of the defined types were placed in this encounter.   Medications Discontinued During This Encounter  Medication Reason  . doxycycline (VIBRA-TABS) 100 MG tablet Completed Course  . predniSONE (DELTASONE) 10 MG tablet Completed Course    Follow-up: No follow-ups on file.   Crecencio Mc, MD

## 2018-05-23 NOTE — Patient Instructions (Addendum)
Continue 50 mg losartan once daily  Discuss the Repatha medication with Dr Ubaldo Glassing  Don't start the furosemide until you get a working accurate scale  Referral to Dr Manuella Ghazi is in progress    Follow up one month

## 2018-05-25 DIAGNOSIS — Z09 Encounter for follow-up examination after completed treatment for conditions other than malignant neoplasm: Secondary | ICD-10-CM | POA: Insufficient documentation

## 2018-05-25 DIAGNOSIS — R9389 Abnormal findings on diagnostic imaging of other specified body structures: Secondary | ICD-10-CM | POA: Insufficient documentation

## 2018-05-25 NOTE — Assessment & Plan Note (Signed)
Continue losartan  At current dose . She has not started the furosemide I recommended as a trial

## 2018-05-25 NOTE — Assessment & Plan Note (Signed)
Patient is stable post discharge and has no new issues or questions about discharge plans at the visit today for hospital follow up. All labs , imaging studies and progress notes from admission were reviewed with patient today   

## 2018-05-25 NOTE — Assessment & Plan Note (Signed)
Tolerating pravastatin  Lab Results  Component Value Date   CHOL 194 05/11/2018   HDL 68 05/11/2018   LDLCALC 96 05/11/2018   LDLDIRECT 90.0 10/23/2016   TRIG 150 (H) 05/11/2018   CHOLHDL 2.9 05/11/2018

## 2018-05-25 NOTE — Assessment & Plan Note (Signed)
Done to evaluate possible lung nodule in right mid lung zone, not seen on CT.  Mosaic pattern suggestive of edema. Furosemide recommended but she Is contemplative

## 2018-05-29 ENCOUNTER — Telehealth: Payer: Self-pay | Admitting: Internal Medicine

## 2018-05-29 ENCOUNTER — Ambulatory Visit: Payer: Medicare Other

## 2018-05-29 ENCOUNTER — Other Ambulatory Visit: Payer: Self-pay

## 2018-05-29 MED ORDER — LOSARTAN POTASSIUM 25 MG PO TABS: 50.0000 mg | ORAL_TABLET | Freq: Every day | ORAL | 2 refills | Status: DC

## 2018-05-29 NOTE — Telephone Encounter (Signed)
LOSARTAN 25 MG  #180 SENT TO [facility]

## 2018-05-29 NOTE — Telephone Encounter (Signed)
Please advise 

## 2018-05-29 NOTE — Telephone Encounter (Signed)
LM that prescription was sent in.

## 2018-05-29 NOTE — Telephone Encounter (Signed)
Medication on backorder, will need new Rx's sent to pharmacy: losartan (COZAAR) 50 MG tablet. Need to switch to 25mg  tablet 2x day or 100mg  tablet half tablet 1x day.   [id number] [address], [place] (Phone) [phone] (Fax)

## 2018-05-29 NOTE — Telephone Encounter (Signed)
Patient stated that since hospital discharge she has been taking 1/2 of the 50 mg tablet twice a day. Her blood pressures have been Monday 126/71 Tuesday 159/86. She was advised by hospital since TIA that it was more safe to have her pressure run slightly higher.

## 2018-05-29 NOTE — Telephone Encounter (Signed)
According to chart she is taking 100 mg daily please confirm with patient before I send in new tx

## 2018-05-29 NOTE — Telephone Encounter (Signed)
Copied from [place] [id number]. Topic: Quick Communication - Rx Refill/Question >> May 29, 2018  9:45 AM Waylan Rocher, [place] wrote: Medication: losartan (COZAAR) 50 MG tablet (need to switch to 25mg  tablet 2x once a day or 100mg  tablet half tablet a day)  Has the patient contacted their pharmacy? Yes.   (Agent: If no, request that the patient contact the pharmacy for the refill.) (Agent: If yes, when and what did the pharmacy advise?)  Preferred Pharmacy (with phone number or street name): [id number] [place] [address] [place] [address] Phone: [phone] Fax: [phone]  Agent: Please be advised that RX refills may take up to 3 business days. We ask that you follow-up with your pharmacy.

## 2018-05-30 ENCOUNTER — Other Ambulatory Visit: Payer: Self-pay | Admitting: Nurse Practitioner

## 2018-05-30 ENCOUNTER — Ambulatory Visit: Payer: Medicare Other | Admitting: Internal Medicine

## 2018-05-30 ENCOUNTER — Other Ambulatory Visit: Payer: Medicare Other

## 2018-05-30 ENCOUNTER — Ambulatory Visit
Admission: RE | Admit: 2018-05-30 | Discharge: 2018-05-30 | Disposition: A | Discharge status: Home / Self Care | Payer: Medicare Other | Source: Ambulatory Visit | Attending: Nurse Practitioner | Admitting: Nurse Practitioner

## 2018-05-30 DIAGNOSIS — K2100 Gastro-esophageal reflux disease with esophagitis, without bleeding: Secondary | ICD-10-CM

## 2018-05-30 DIAGNOSIS — R490 Dysphonia: Secondary | ICD-10-CM | POA: Diagnosis present

## 2018-05-31 ENCOUNTER — Ambulatory Visit: Payer: Medicare Other | Admitting: Internal Medicine

## 2018-06-03 ENCOUNTER — Other Ambulatory Visit: Payer: Self-pay | Admitting: Nurse Practitioner

## 2018-06-03 DIAGNOSIS — R1012 Left upper quadrant pain: Secondary | ICD-10-CM

## 2018-06-23 ENCOUNTER — Other Ambulatory Visit: Payer: Self-pay | Admitting: Internal Medicine

## 2018-07-02 ENCOUNTER — Ambulatory Visit: Payer: Medicare Other | Admitting: Internal Medicine

## 2018-07-09 ENCOUNTER — Ambulatory Visit: Admission: RE | Admit: 2018-07-09 | Payer: Medicare Other | Source: Ambulatory Visit

## 2018-07-25 ENCOUNTER — Ambulatory Visit: Payer: Medicare Other | Admitting: Internal Medicine

## 2018-08-14 ENCOUNTER — Telehealth: Payer: Self-pay

## 2018-08-14 MED ORDER — LOSARTAN POTASSIUM 25 MG PO TABS: 50.0000 mg | ORAL_TABLET | Freq: Every day | ORAL | 1 refills | Status: DC

## 2018-08-16 ENCOUNTER — Other Ambulatory Visit: Payer: Self-pay | Admitting: Internal Medicine

## 2018-08-16 MED ORDER — LOSARTAN POTASSIUM 50 MG PO TABS: 50.0000 mg | ORAL_TABLET | Freq: Every day | ORAL | 0 refills | Status: DC

## 2018-08-16 NOTE — Telephone Encounter (Signed)
Yes,  New rx for losartan [facility] mg [facility]

## 2018-08-16 NOTE — Telephone Encounter (Signed)
[facility] called in and wants to know if its okay to do 50mg  once a day of the 90day supply instead of 25mg  twice a day.  CB# [phone]  Ref # [id number]

## 2018-08-16 NOTE — Telephone Encounter (Signed)
Pt is currently taking 2 tablets of 25mg  losartan. Pharmacy is calling wanting to make sure it is okay to fill the rx for the 50mg  tablet once daily.

## 2018-08-26 ENCOUNTER — Encounter: Payer: Self-pay | Admitting: Internal Medicine

## 2018-08-26 ENCOUNTER — Ambulatory Visit (INDEPENDENT_AMBULATORY_CARE_PROVIDER_SITE_OTHER): Payer: Medicare Other | Admitting: Internal Medicine

## 2018-08-26 VITALS — BP 130/90 | HR 15 | Temp 98.5°F | Resp 15 | Ht 63.0 in | Wt 175.4 lb

## 2018-08-26 DIAGNOSIS — I4819 Other persistent atrial fibrillation: Secondary | ICD-10-CM | POA: Diagnosis not present

## 2018-08-26 DIAGNOSIS — E782 Mixed hyperlipidemia: Secondary | ICD-10-CM

## 2018-08-26 DIAGNOSIS — I10 Essential (primary) hypertension: Secondary | ICD-10-CM | POA: Diagnosis not present

## 2018-08-26 DIAGNOSIS — E1121 Type 2 diabetes mellitus with diabetic nephropathy: Secondary | ICD-10-CM

## 2018-08-26 DIAGNOSIS — Z8673 Personal history of transient ischemic attack (TIA), and cerebral infarction without residual deficits: Secondary | ICD-10-CM

## 2018-08-26 DIAGNOSIS — L989 Disorder of the skin and subcutaneous tissue, unspecified: Secondary | ICD-10-CM

## 2018-08-26 MED ORDER — ESCITALOPRAM OXALATE 10 MG PO TABS: 10.0000 mg | ORAL_TABLET | Freq: Every day | ORAL | 2 refills | Status: DC

## 2018-08-26 MED ORDER — TRIAMCINOLONE ACETONIDE 0.1 % EX CREA: 1.0000 "application " | TOPICAL_CREAM | Freq: Two times a day (BID) | CUTANEOUS | 0 refills | Status: DC

## 2018-08-26 NOTE — Patient Instructions (Addendum)
I agree with trial of Red Yeast Rice 600 mg twice daily with our without food   [employer] has a  Regular seniors meeting that just started as a social event.  Jinger Neighbors is the pastor , the church is located  On [address] in [place] .  [facility] on your right    I highly recommend resuming generic Lexapro (escitalopram).  It is SAFE for longterm daily use and meant to be taken daily for management of depression  Return In late May for fasting labs and OV

## 2018-08-26 NOTE — Progress Notes (Signed)
Subjective:  Patient ID: Linda [name], female    DOB: 04/21/1934  Age: 85 y.o. MRN: [id number]  CC: The primary encounter diagnosis was Mixed hyperlipidemia. Diagnoses of Essential hypertension, Controlled type 2 diabetes mellitus with diabetic nephropathy, without long-term current use of insulin ([place]), Persistent atrial fibrillation, History of CVA (cerebrovascular accident) without residual deficits, Skin lesion of hand, and Type 2 diabetes mellitus with diabetic nephropathy, without long-term current use of insulin ([place]) were also pertinent to this visit.  HPI Linda [name] presents for follow up on recent neurology evaluation for ischemic CVA that occurred while subtherapeutic  on coumadin which was started to manage increased embolic stroke risk due to   new onset atrial fib.  Now taking and tolerating Eliquis.  She has not    started taking RYR as recommended by neurology secondary to concerns about side effects giv en her history of statin intolerance .    Taking prilosec 40 mg bid per elliott's PA>   plus pepcid at night .  Gastric Burning has improved.  Still hoarse and coughing intermittently . ENT recommended voice lessons.  using sugar free lemon cough drops  Doesn't want any more medications  .  Going to the gym 3/week silver sneakers .  Walking on the treadmill for 45 minutes  and riding the recumbent bike.      2 new bumps on left hand,  Recently had skin cancer removed from left hand in February  By Kellie Moor   Grief/:  4 close  friends have passed away recently and she is no longer seeing her boyfriend in [place] bc of his declining health .  She is estranged from Investment banker, corporate , but son lives in [place] and is in close contact.  She feels lonely most of the time and is looking for more social outlets.  She had to stop volunteering at the [facility] center because the shift interfered with her exercise schedule.   Outpatient Medications Prior to Visit  Medication Sig Dispense Refill   . ALPRAZolam (XANAX) 0.25 MG tablet Take 1 tablet (0.25 mg total) by mouth 2 (two) times daily as needed for anxiety. 30 tablet 1  . Cholecalciferol (VITAMIN D) 2000 units tablet Take 2,000 Units by mouth daily.    Marland Kitchen ELIQUIS 5 MG TABS tablet TK 1 T PO BID    . estradiol (ESTRACE) 0.1 MG/GM vaginal cream Place 1 g vaginally 3 (three) times a week. Pea sized amount per urethra 42.5 g 12  . famotidine (PEPCID) 40 MG tablet     . feeding supplement (BOOST HIGH PROTEIN) LIQD Take 1 Container by mouth daily.    Marland Kitchen losartan (COZAAR) 50 MG tablet Take 1 tablet (50 mg total) by mouth daily. 90 tablet 0  . metroNIDAZOLE (METROCREAM) 0.75 % cream Apply 1 application topically 2 (two) times daily.   5  . omeprazole (PRILOSEC) 40 MG capsule Take 40 mg by mouth daily.    . pravastatin (PRAVACHOL) 20 MG tablet Take 1 tablet (20 mg total) by mouth daily at 6 PM. 90 tablet 0  . furosemide (LASIX) 20 MG tablet TAKE 1 TABLET BY MOUTH EVERY DAY (Patient not taking: Reported on 08/26/2018) 30 tablet 2  . ondansetron (ZOFRAN) 4 MG tablet Take 4 mg by mouth every 8 (eight) hours as needed for nausea.   0  . COUMADIN 5 MG tablet Take as directed by coumadin clinic (Patient not taking: Reported on 08/26/2018) 90 tablet 1  . escitalopram (LEXAPRO) 10 MG  tablet Take 1 tablet (10 mg total) by mouth daily. (Patient not taking: Reported on 05/23/2018) 30 tablet 2   No facility-administered medications prior to visit.     Review of Systems;  Patient denies headache, fevers, malaise, unintentional weight loss, skin rash, eye pain, sinus congestion and sinus pain, sore throat, dysphagia,  hemoptysis , cough, dyspnea, wheezing, chest pain, palpitations, orthopnea, edema, abdominal pain, nausea, melena, diarrhea, constipation, flank pain, dysuria, hematuria, urinary  Frequency, nocturia, numbness, tingling, seizures,  Focal weakness, Loss of consciousness,  Tremor, insomnia, depression, anxiety, and suicidal ideation.       Objective:  BP 130/90 (BP Location: Left Arm, Patient Position: Sitting, Cuff Size: Normal)   Pulse (!) 15   Temp 98.5 F (36.9 C) (Oral)   Resp 15   Ht 5\' 3"  (1.6 m)   Wt 175 lb 6.4 oz (79.6 kg)   SpO2 95%   BMI 31.07 kg/m   BP Readings from Last 3 Encounters:  08/26/18 130/90  05/23/18 134/82  05/11/18 102/65    Wt Readings from Last 3 Encounters:  08/26/18 175 lb 6.4 oz (79.6 kg)  05/23/18 177 lb 3.2 oz (80.4 kg)  05/10/18 174 lb (78.9 kg)    General appearance: alert, cooperative and appears stated age Ears: normal TM's and external ear canals both ears Throat: lips, mucosa, and tongue normal; teeth and gums normal Neck: no adenopathy, no carotid bruit, supple, symmetrical, trachea midline and thyroid not enlarged, symmetric, no tenderness/mass/nodules Back: symmetric, no curvature. ROM normal. No CVA tenderness. Lungs: clear to auscultation bilaterally Heart: regular rate and rhythm, S1, S2 normal, no murmur, click, rub or gallop Abdomen: soft, non-tender; bowel sounds normal; no masses,  no organomegaly Pulses: 2+ and symmetric Skin: Skin color, texture, turgor normal. No rashes or lesions Lymph nodes: Cervical, supraclavicular, and axillary nodes normal.  Lab Results  Component Value Date   HGBA1C 6.3 (H) 05/11/2018   HGBA1C 6.3 (A) 02/07/2018   HGBA1C 6.5 11/06/2017    Lab Results  Component Value Date   CREATININE 0.80 05/10/2018   CREATININE 0.90 02/07/2018   CREATININE 0.75 11/06/2017    Lab Results  Component Value Date   WBC 6.8 05/10/2018   HGB 16.0 (H) 05/10/2018   HCT 48.3 (H) 05/10/2018   PLT 197 05/10/2018   GLUCOSE 182 (H) 05/10/2018   CHOL 194 05/11/2018   TRIG 150 (H) 05/11/2018   HDL 68 05/11/2018   LDLDIRECT 90.0 10/23/2016   LDLCALC 96 05/11/2018   ALT 27 05/10/2018   AST 29 05/10/2018   NA 139 05/10/2018   K 3.7 05/10/2018   CL 100 05/10/2018   CREATININE 0.80 05/10/2018   BUN 25 (H) 05/10/2018   CO2 27 05/10/2018    TSH 1.48 04/27/2016   INR 1.78 05/11/2018   HGBA1C 6.3 (H) 05/11/2018   MICROALBUR 12.1 (H) 04/30/2017    Dg Ugi W/high Density W/o Kub  Result Date: 05/30/2018 CLINICAL DATA:  Hoarseness and coughing up mucus after meals for 3 years EXAM: UPPER GI SERIES WITHOUT KUB TECHNIQUE: Routine upper GI series was performed with thin/high density/water soluble barium. FLUOROSCOPY TIME:  Fluoroscopy Time:  1 minutes Radiation Exposure Index (if provided by the fluoroscopic device): 8.4 mGy Number of Acquired Spot Images: 0 COMPARISON:  None. FINDINGS: Examination of the esophagus demonstrated normal esophageal motility. Normal esophageal morphology without evidence of esophagitis or ulceration. No esophageal stricture, diverticula, or mass lesion. No evidence of hiatal hernia. Moderate spontaneous gastroesophageal reflux extending to the level  of the cervical esophagus. Examination of the stomach demonstrated normal rugal folds and areae gastricae. The gastric mucosa appeared unremarkable without evidence of ulceration, scarring, or mass lesion. Gastric motility and emptying was normal. Fluoroscopic examination of the duodenum demonstrates normal motility and morphology without evidence of ulceration or mass lesion. At the end of the examination a 13 mm barium tablet was administered which transited through the esophagus and esophagogastric junction without delay. IMPRESSION: 1. Moderate spontaneous gastroesophageal reflux extending to the level of the cervical esophagus. 2. Otherwise normal upper GI. Electronically Signed   By: Kathreen Devoid   On: 05/30/2018 09:15    Assessment & Plan:   Problem List Items Addressed This Visit    ATRIAL FIBRILLATION    Well controlled.  Anticoagulated on Eliquis (treatment failure on coumadin,  Had CVA)      Relevant Medications   ELIQUIS 5 MG TABS tablet   Hyperlipidemia - Primary    Untreated due to statin intolerance.  Encouraged to begin RYR 600 mg bid . If  intolerant,  Gi en CVA , may be a candidate for REpatha or other PSK9  inhibitor      Relevant Medications   ELIQUIS 5 MG TABS tablet   Other Relevant Orders   Lipid panel   Essential hypertension   Relevant Medications   ELIQUIS 5 MG TABS tablet   Other Relevant Orders   Comprehensive metabolic panel   History of CVA (cerebrovascular accident) without residual deficits    occurred Nov 2019 with sudden onset of vision loss in the left eye. Continue eliquis,  Urged to try RYR .by Dr Manuella Ghazi and me.  May be a candidate for PSK9 inhibitor       Skin lesion of hand    Trial of steroid cream..if lesions persist, return to dermatology for biopsy       RESOLVED: Type 2 diabetes mellitus with diabetic nephropathy, without long-term current use of insulin ([facility])    Well controlled on diet alone.    She  has microalbuminuria and cerebrovascular disease  And is already taking an ARB. She is intolerant of all statins   due to leg pain .  Aspirin is relatively contraindicated since she is already taking Eliquis .   Lab Results  Component Value Date   HGBA1C 6.3 (H) 05/11/2018   Lab Results  Component Value Date   MICROALBUR 12.1 (H) 04/30/2017            I have discontinued Horace M. Albro's Coumadin and pravastatin. I am also having her start on triamcinolone cream. Additionally, I am having her maintain her Vitamin D, feeding supplement, estradiol, metroNIDAZOLE, ALPRAZolam, ondansetron, omeprazole, famotidine, furosemide, losartan, Eliquis, and escitalopram.  Meds ordered this encounter  Medications  . triamcinolone cream (KENALOG) 0.1 %    Sig: Apply 1 application topically 2 (two) times daily.    Dispense:  30 g    Refill:  0  . escitalopram (LEXAPRO) 10 MG tablet    Sig: Take 1 tablet (10 mg total) by mouth daily.    Dispense:  30 tablet    Refill:  2    KEEP ON FILE FOR FUTURE REFILLS    Medications Discontinued During This Encounter  Medication Reason  . COUMADIN 5 MG  tablet Change in therapy  . escitalopram (LEXAPRO) 10 MG tablet Reorder  . pravastatin (PRAVACHOL) 20 MG tablet     Follow-up: Return in about 11 weeks (around 11/11/2018) for follow up diabetes, depression .  Crecencio Mc, MD

## 2018-08-27 DIAGNOSIS — L989 Disorder of the skin and subcutaneous tissue, unspecified: Secondary | ICD-10-CM | POA: Insufficient documentation

## 2018-08-27 MED ORDER — RED YEAST RICE 600 MG PO CAPS: 1.0000 | ORAL_CAPSULE | Freq: Two times a day (BID) | ORAL | 5 refills | Status: DC

## 2018-08-27 NOTE — Assessment & Plan Note (Addendum)
Untreated due to statin intolerance.  Encouraged to begin RYR 600 mg bid . If intolerant,  Gi en CVA , may be a candidate for REpatha or other PSK9  inhibitor

## 2018-08-27 NOTE — Assessment & Plan Note (Signed)
Well controlled.  Anticoagulated on Eliquis (treatment failure on coumadin,  Had CVA) 

## 2018-08-27 NOTE — Assessment & Plan Note (Signed)
Trial of steroid cream..if lesions persist, return to dermatology for biopsy

## 2018-08-27 NOTE — Assessment & Plan Note (Signed)
Well controlled on diet alone.    She  has microalbuminuria and cerebrovascular disease  And is already taking an ARB. She is intolerant of all statins   due to leg pain .  Aspirin is relatively contraindicated since she is already taking Eliquis .   Lab Results  Component Value Date   HGBA1C 6.3 (H) 05/11/2018   Lab Results  Component Value Date   MICROALBUR 12.1 (H) 04/30/2017

## 2018-08-27 NOTE — Assessment & Plan Note (Addendum)
occurred Nov 2019 with sudden onset of vision loss in the left eye. Continue eliquis,  Urged to try RYR .by Dr Manuella Ghazi and me.  May be a candidate for PSK9 inhibitor

## 2018-10-11 MED ORDER — ALPRAZOLAM 0.25 MG PO TABS: 0.2500 mg | ORAL_TABLET | Freq: Two times a day (BID) | ORAL | 0 refills | Status: DC | PRN

## 2018-10-11 NOTE — Telephone Encounter (Signed)
Notify pt that I have sent in rx for alprazolam.

## 2018-10-11 NOTE — Telephone Encounter (Signed)
Patient needs refill on alprazolam has only 2 pills left,patient offered to cut dose in half to make medication last but says this really does not help her.

## 2018-10-11 NOTE — Telephone Encounter (Signed)
Patient is aware 

## 2018-11-21 ENCOUNTER — Encounter: Payer: Self-pay | Admitting: Internal Medicine

## 2018-11-21 ENCOUNTER — Ambulatory Visit (INDEPENDENT_AMBULATORY_CARE_PROVIDER_SITE_OTHER): Payer: Medicare Other | Admitting: Internal Medicine

## 2018-11-21 DIAGNOSIS — R5383 Other fatigue: Secondary | ICD-10-CM | POA: Diagnosis not present

## 2018-11-21 DIAGNOSIS — I10 Essential (primary) hypertension: Secondary | ICD-10-CM | POA: Diagnosis not present

## 2018-11-21 DIAGNOSIS — E1121 Type 2 diabetes mellitus with diabetic nephropathy: Secondary | ICD-10-CM

## 2018-11-21 DIAGNOSIS — K2900 Acute gastritis without bleeding: Secondary | ICD-10-CM

## 2018-11-21 NOTE — Progress Notes (Signed)
Telepohone Note  This visit type was conducted due to national recommendations for restrictions regarding the COVID-19 pandemic (e.g. social distancing).  This format is felt to be most appropriate for this patient at this time.  All issues noted in this document were discussed and addressed.  No physical exam was performed (except for noted visual exam findings with Video Visits).   I connected with@ on 11/21/18 at  8:30 AM EDT by a video enabled telemedicine application or telephone and verified that I am speaking with the correct person using two identifiers. Location patient: home Location provider: work or home office Persons participating in the virtual visit: patient, provider  I discussed the limitations, risks, security and privacy concerns of performing an evaluation and management service by telephone and the availability of in person appointments. I also discussed with the patient that there may be a patient responsible charge related to this service. The patient expressed understanding and agreed to proceed.  Reason for visit: diabetes follow up  HPI:  The patient has no signs or symptoms of COVID 19 infection (fever, cough, sore throat  or shortness of breath beyond what is typical for patient).  Patient denies contact with other persons with the above mentioned symptoms or with anyone confirmed to have COVID 19   Type 2  diabetes.   Patient is following a low glycemic index diet and taking all prescribed medications regularly without side effects.  Fasting sugars have been under less than 140 most of the time and post prandials have been under 160 except on rare occasions. Patient is exercising about 3 times per week and intentionally trying to lose weight .  Patient has had an eye exam in the last 12 months and saw podiatry recently for an ingrown toenail. She checks feet regularly for signs of infection.  Patient does not walk barefoot outside,  And denies an numbness tingling or  burning in feet. Patient is up to date on all recommended vaccinations  Chronic atrial fib:  Saw Cardiology Fath April 21 with complaints of dizziness, fatigue and weakness.  Marland Kitchen ECHO done EF 40 to 45% moderate MR/TR (stable) . No changes. Holter monitor noted bradycardia at night with brief pauss  HTN: chlorthalidone started by Dr. Ubaldo Glassing  Cc: abdominal pain ,  Aggravated by fasting .  PPI increased to bid and famotidine qhs added by GI PA at [facility] .  No improvement this far,  Notes tenderness  On her left side . Has not had CBC since NOvember 2019 at which time warfarin was changed to Eliquis   ROS: See pertinent positives and negatives per HPI.  Past Medical History:  Diagnosis Date  . Anxiety   . Arrhythmia    paroxysmal atrial fibrillation  . Colon polyp   . Diverticulitis   . Hard of hearing   . Hyperlipidemia   . Kidney stone   . Parathyroid disease ([place])    Parathyroidectomy   . PVC (premature ventricular contraction)   . Skin cancer   . UTI (lower urinary tract infection)     Past Surgical History:  Procedure Laterality Date  . ABDOMINAL HYSTERECTOMY    . CHOLECYSTECTOMY  2002  . LITHOTRIPSY    . PARATHYROIDECTOMY  2004   1 removed  . ROTATOR CUFF REPAIR  2002  . TONSILLECTOMY  1956  . TONSILLECTOMY    . TOTAL ABDOMINAL HYSTERECTOMY W/ BILATERAL SALPINGOOPHORECTOMY  1984  . VEIN LIGATION AND STRIPPING      Family History  Problem Relation Age of Onset  . Heart disease Brother   . Stroke Mother   . Skin cancer Father   . Breast cancer Paternal Grandmother     SOCIAL HX:  reports that she has never smoked. She has never used smokeless tobacco. She reports that she does not drink alcohol or use drugs.   Current Outpatient Medications:  .  ALPRAZolam (XANAX) 0.25 MG tablet, Take 1 tablet (0.25 mg total) by mouth 2 (two) times daily as needed for anxiety., Disp: 30 tablet, Rfl: 0 .  chlorthalidone (HYGROTON) 25 MG tablet, , Disp: , Rfl:  .  Cholecalciferol  (VITAMIN D) 2000 units tablet, Take 2,000 Units by mouth daily., Disp: , Rfl:  .  ELIQUIS 5 MG TABS tablet, TK 1 T PO BID, Disp: , Rfl:  .  escitalopram (LEXAPRO) 10 MG tablet, Take 1 tablet (10 mg total) by mouth daily., Disp: 30 tablet, Rfl: 2 .  estradiol (ESTRACE) 0.1 MG/GM vaginal cream, Place 1 g vaginally 3 (three) times a week. Pea sized amount per urethra, Disp: 42.5 g, Rfl: 12 .  feeding supplement (BOOST HIGH PROTEIN) LIQD, Take 1 Container by mouth daily., Disp: , Rfl:  .  losartan (COZAAR) 50 MG tablet, Take 2 tablets (100 mg total) by mouth daily., Disp: 180 tablet, Rfl: 0 .  metroNIDAZOLE (METROCREAM) 0.75 % cream, Apply 1 application topically 2 (two) times daily. , Disp: , Rfl: 5 .  ondansetron (ZOFRAN) 4 MG tablet, Take 4 mg by mouth every 8 (eight) hours as needed for nausea. , Disp: , Rfl: 0 .  triamcinolone cream (KENALOG) 0.1 %, Apply 1 application topically 2 (two) times daily., Disp: 30 g, Rfl: 0 .  famotidine (PEPCID) 40 MG tablet, , Disp: , Rfl:  .  furosemide (LASIX) 20 MG tablet, TAKE 1 TABLET BY MOUTH EVERY DAY (Patient not taking: Reported on 08/26/2018), Disp: 30 tablet, Rfl: 2 .  omeprazole (PRILOSEC) 40 MG capsule, Take 1 capsule (40 mg total) by mouth 2 (two) times daily at 10 AM and 5 PM., Disp: 180 capsule, Rfl: 1 .  Red Yeast Rice 600 MG CAPS, Take 1 capsule (600 mg total) by mouth 2 (two) times daily. (Patient not taking: Reported on 11/21/2018), Disp: 180 capsule, Rfl: 5  EXAM:  VITALS per patient if applicable:  GENERAL: alert, oriented, appears well and in no acute distress  HEENT: atraumatic, conjunttiva clear, no obvious abnormalities on inspection of external nose and ears  NECK: normal movements of the head and neck  LUNGS: on inspection no signs of respiratory distress, breathing rate appears normal, no obvious gross SOB, gasping or wheezing  CV: no obvious cyanosis  MS: moves all visible extremities without noticeable abnormality  PSYCH/NEURO:  pleasant and cooperative, no obvious depression or anxiety, speech and thought processing grossly intact  ASSESSMENT AND PLAN:  Discussed the following assessment and plan:  Acute gastritis, presence of bleeding unspecified, unspecified gastritis type - Plan: H Pylori, IGM, IGG, IGA AB  Fatigue, unspecified type - Plan: CBC with Differential/Platelet  Essential hypertension  Type 2 diabetes mellitus with diabetic nephropathy, without long-term current use of insulin (HCC)  Gastritis Her pain is aggravated by fasting and has not improved with bid PPI and qhs H2 blocker .  checking H pylori serology and CBC   Essential hypertension Taking losartan 50 mg bid,  Chlorthalidone added in April by Cardiology  Type 2 diabetes mellitus ([place]) Well controlled on diet alone.    She  has microalbuminuria  and cerebrovascular disease  And is already taking an ARB. She is intolerant of all statins   due to leg pain .  Aspirin is relatively contraindicated since she is already taking Eliquis . She is due for labs.   Lab Results  Component Value Date   HGBA1C 6.3 (H) 05/11/2018   Lab Results  Component Value Date   MICROALBUR 12.1 (H) 04/30/2017       I discussed the assessment and treatment plan with the patient. The patient was provided an opportunity to ask questions and all were answered. The patient agreed with the plan and demonstrated an understanding of the instructions.   The patient was advised to call back or seek an in-person evaluation if the symptoms worsen or if the condition fails to improve as anticipated.  I provided 25 minutes of non-face-to-face time during this encounter.   Crecencio Mc, MD

## 2018-11-21 NOTE — Patient Instructions (Addendum)
Your gastritis may be due to H Pylori bacteria.  I am checking for that with your future labs.  This is treated with additional medications (antibiotics) if found.  You can use Mylanta or Gaviscon as needed for the burning sensation.  But you need to drink it one hour before or 2 hours AFTER your medications to allow them to be absorbed   Linda [name] will call you with a fasting lab appointment

## 2018-11-22 NOTE — Progress Notes (Signed)
LMTCB. Need to schedule pt for a fasting lab appt.

## 2018-11-23 MED ORDER — OMEPRAZOLE 40 MG PO CPDR: 40.0000 mg | DELAYED_RELEASE_CAPSULE | Freq: Two times a day (BID) | ORAL | 1 refills | Status: DC

## 2018-11-23 MED ORDER — LOSARTAN POTASSIUM 50 MG PO TABS: 100.0000 mg | ORAL_TABLET | Freq: Every day | ORAL | 0 refills | Status: DC

## 2018-11-23 NOTE — Assessment & Plan Note (Signed)
Her pain is aggravated by fasting and has not improved with bid PPI and qhs H2 blocker .  checking H pylori serology and CBC

## 2018-11-23 NOTE — Assessment & Plan Note (Signed)
Well controlled on diet alone.    She  has microalbuminuria and cerebrovascular disease  And is already taking an ARB. She is intolerant of all statins   due to leg pain .  Aspirin is relatively contraindicated since she is already taking Eliquis . She is due for labs.   Lab Results  Component Value Date   HGBA1C 6.3 (H) 05/11/2018   Lab Results  Component Value Date   MICROALBUR 12.1 (H) 04/30/2017

## 2018-11-23 NOTE — Assessment & Plan Note (Signed)
Taking losartan 50 mg bid,  Chlorthalidone added in April by Cardiology

## 2018-11-26 NOTE — Progress Notes (Signed)
Fasting labs have been scheduled. Pt is aware of appt date and time.

## 2018-11-27 ENCOUNTER — Other Ambulatory Visit: Payer: Self-pay

## 2018-11-27 ENCOUNTER — Other Ambulatory Visit (INDEPENDENT_AMBULATORY_CARE_PROVIDER_SITE_OTHER): Payer: Medicare Other

## 2018-11-27 DIAGNOSIS — E782 Mixed hyperlipidemia: Secondary | ICD-10-CM | POA: Diagnosis not present

## 2018-11-27 DIAGNOSIS — E1121 Type 2 diabetes mellitus with diabetic nephropathy: Secondary | ICD-10-CM

## 2018-11-27 DIAGNOSIS — R5383 Other fatigue: Secondary | ICD-10-CM | POA: Diagnosis not present

## 2018-11-27 DIAGNOSIS — K2900 Acute gastritis without bleeding: Secondary | ICD-10-CM

## 2018-11-27 DIAGNOSIS — I10 Essential (primary) hypertension: Secondary | ICD-10-CM

## 2018-11-27 DIAGNOSIS — R61 Generalized hyperhidrosis: Secondary | ICD-10-CM

## 2018-11-27 LAB — CBC WITH DIFFERENTIAL/PLATELET
Basophils Absolute: 0 10*3/uL (ref 0.0–0.1)
Basophils Relative: 0.9 % (ref 0.0–3.0)
Eosinophils Absolute: 0.1 10*3/uL (ref 0.0–0.7)
Eosinophils Relative: 1.3 % (ref 0.0–5.0)
HCT: 46.6 % — ABNORMAL HIGH (ref 36.0–46.0)
Hemoglobin: 15.8 g/dL — ABNORMAL HIGH (ref 12.0–15.0)
Lymphocytes Relative: 26.4 % (ref 12.0–46.0)
Lymphs Abs: 1.3 10*3/uL (ref 0.7–4.0)
MCHC: 33.9 g/dL (ref 30.0–36.0)
MCV: 94.9 fl (ref 78.0–100.0)
Monocytes Absolute: 0.5 10*3/uL (ref 0.1–1.0)
Monocytes Relative: 10 % (ref 3.0–12.0)
Neutro Abs: 3 10*3/uL (ref 1.4–7.7)
Neutrophils Relative %: 61.4 % (ref 43.0–77.0)
Platelets: 172 10*3/uL (ref 150.0–400.0)
RBC: 4.91 Mil/uL (ref 3.87–5.11)
RDW: 14.3 % (ref 11.5–15.5)
WBC: 4.8 10*3/uL (ref 4.0–10.5)

## 2018-11-27 LAB — COMPREHENSIVE METABOLIC PANEL
ALT: 17 U/L (ref 0–35)
AST: 22 U/L (ref 0–37)
Albumin: 4 g/dL (ref 3.5–5.2)
Alkaline Phosphatase: 59 U/L (ref 39–117)
BUN: 23 mg/dL (ref 6–23)
CO2: 33 mEq/L — ABNORMAL HIGH (ref 19–32)
Calcium: 9.2 mg/dL (ref 8.4–10.5)
Chloride: 98 mEq/L (ref 96–112)
Creatinine, Ser: 0.84 mg/dL (ref 0.40–1.20)
GFR: 64.74 mL/min (ref >60.00)
Glucose, Bld: 138 mg/dL — ABNORMAL HIGH (ref 70–99)
Potassium: 3.3 mEq/L — ABNORMAL LOW (ref 3.5–5.1)
Sodium: 140 mEq/L (ref 135–145)
Total Bilirubin: 1.4 mg/dL — ABNORMAL HIGH (ref 0.2–1.2)
Total Protein: 6.4 g/dL (ref 6.0–8.3)

## 2018-11-27 LAB — TSH: TSH: 2.09 u[IU]/mL (ref 0.35–4.50)

## 2018-11-27 LAB — LIPID PANEL
Cholesterol: 210 mg/dL — ABNORMAL HIGH (ref 0–200)
HDL: 72.9 mg/dL (ref >39.00)
LDL Cholesterol: 125 mg/dL — ABNORMAL HIGH (ref 0–99)
NonHDL: 136.67
Total CHOL/HDL Ratio: 3
Triglycerides: 56 mg/dL (ref 0.0–149.0)
VLDL: 11.2 mg/dL (ref 0.0–40.0)

## 2018-11-27 LAB — MICROALBUMIN / CREATININE URINE RATIO
Creatinine,U: 170.7 mg/dL
Microalb Creat Ratio: 4.2 mg/g (ref 0.0–30.0)
Microalb, Ur: 7.2 mg/dL — ABNORMAL HIGH (ref 0.0–1.9)

## 2018-11-27 LAB — HEMOGLOBIN A1C: Hgb A1c MFr Bld: 6.8 % — ABNORMAL HIGH (ref 4.6–6.5)

## 2018-11-29 LAB — H PYLORI, IGM, IGG, IGA AB
H pylori, IgM Abs: <9 units (ref 0.0–8.9)
H. pylori, IgA Abs: <9 units (ref 0.0–8.9)
H. pylori, IgG AbS: 0.32 Index Value (ref 0.00–0.79)

## 2018-12-11 ENCOUNTER — Other Ambulatory Visit: Payer: Self-pay | Admitting: Internal Medicine

## 2018-12-12 ENCOUNTER — Other Ambulatory Visit: Payer: Self-pay | Admitting: Internal Medicine

## 2018-12-12 ENCOUNTER — Telehealth: Payer: Self-pay | Admitting: Internal Medicine

## 2018-12-12 MED ORDER — LORAZEPAM 0.5 MG PO TABS: ORAL_TABLET | ORAL | 0 refills | Status: DC

## 2018-12-12 MED ORDER — LOSARTAN POTASSIUM 25 MG PO TABS: 25.0000 mg | ORAL_TABLET | Freq: Every day | ORAL | 0 refills | Status: DC

## 2018-12-12 NOTE — Telephone Encounter (Signed)
Not in current medication list 

## 2018-12-12 NOTE — Telephone Encounter (Signed)
Losartan dose corrected in chart and lorazepam refilled.  This is IN PLACE of alprazolam that she used in April

## 2018-12-12 NOTE — Telephone Encounter (Signed)
Pt is calling to let md know she is not taking  losartan 50 mg twice a day. Pt is taking losartan 25 mg twice a day. Please confirm. Pt does not need a refill

## 2018-12-13 NOTE — Telephone Encounter (Signed)
Her chart has been updated.  Does she have any blood pressure readings to confirm that she is at goal?

## 2018-12-16 NOTE — Telephone Encounter (Signed)
Majority of readings are fine for her age,  No changes to meds are needed

## 2018-12-16 NOTE — Telephone Encounter (Signed)
Called and spoke to patient.  Patient aware that chart has been updated with correct dosage of losartan.  Patient said that her cardiologist, Dr. Ubaldo Glassing at [facility] has started her on chlorthalidone 25 mg daily for bp in addition to the losartan.  Pt said that Dr. Ubaldo Glassing had advised her not to take her blood pressure everyday unless she was not feeling well.  Patient said that she had some readings that were taken when she wasn't feeling well but did not have the dates recorded.  Patient only had one date recorded on 6/12 - 132/82 @ 12 noon and 131/77 @ 5:30 pm.  Other reading given have no dates:  152/88, 148/85, 127/72, 124/71, 129/90, 138/80, 132/82, 131/77, 143/85, 126/82, 116/82, 122/69, 150/93, 116/82.   Patient said that it is hard for her to function when her bp drops below 121 stating she feels weak and lethargic.  Patient said that she will ask cardiologist about it at her next scheduled appt at [facility] on 01/13/19.  Patient informed that bp readings and notes will be forwarded to Dr. Derrel Nip for review and her assistant will contact pt if Dr. Derrel Nip makes any recommendations.

## 2018-12-17 NOTE — Telephone Encounter (Signed)
Spoke with pt to let her know that the readings she gave are fine for age and that there is no medication changes needed at this time. Pt gave a verbal understanding.

## 2019-01-27 LAB — HM DIABETES EYE EXAM

## 2019-02-26 ENCOUNTER — Encounter: Payer: Self-pay | Admitting: *Deleted

## 2019-02-26 ENCOUNTER — Other Ambulatory Visit: Payer: Self-pay

## 2019-02-28 ENCOUNTER — Other Ambulatory Visit: Payer: Self-pay

## 2019-02-28 ENCOUNTER — Other Ambulatory Visit
Admission: RE | Admit: 2019-02-28 | Discharge: 2019-02-28 | Disposition: A | Discharge status: Home / Self Care | Payer: Medicare Other | Source: Ambulatory Visit | Attending: Ophthalmology | Admitting: Ophthalmology

## 2019-02-28 DIAGNOSIS — Z01812 Encounter for preprocedural laboratory examination: Secondary | ICD-10-CM | POA: Diagnosis present

## 2019-02-28 DIAGNOSIS — H2512 Age-related nuclear cataract, left eye: Secondary | ICD-10-CM | POA: Diagnosis not present

## 2019-02-28 DIAGNOSIS — Z20828 Contact with and (suspected) exposure to other viral communicable diseases: Secondary | ICD-10-CM | POA: Insufficient documentation

## 2019-03-01 LAB — SARS CORONAVIRUS 2 (TAT 6-24 HRS): SARS Coronavirus 2: NEGATIVE

## 2019-03-03 NOTE — Discharge Instructions (Signed)
General Anesthesia, Adult, Care After °This sheet gives you information about how to care for yourself after your procedure. Your health care provider may also give you more specific instructions. If you have problems or questions, contact your health care provider. °What can I expect after the procedure? °After the procedure, the following side effects are common: °· Pain or discomfort at the IV site. °· Nausea. °· Vomiting. °· Sore throat. °· Trouble concentrating. °· Feeling cold or chills. °· Weak or tired. °· Sleepiness and fatigue. °· Soreness and body aches. These side effects can affect parts of the body that were not involved in surgery. °Follow these instructions at home: ° °For at least 24 hours after the procedure: °· Have a responsible adult stay with you. It is important to have someone help care for you until you are awake and alert. °· Rest as needed. °· Do not: °? Participate in activities in which you could fall or become injured. °? Drive. °? Use heavy machinery. °? Drink alcohol. °? Take sleeping pills or medicines that cause drowsiness. °? Make important decisions or sign legal documents. °? Take care of children on your own. °Eating and drinking °· Follow any instructions from your health care provider about eating or drinking restrictions. °· When you feel hungry, start by eating small amounts of foods that are soft and easy to digest (bland), such as toast. Gradually return to your regular diet. °· Drink enough fluid to keep your urine pale yellow. °· If you vomit, rehydrate by drinking water, juice, or clear broth. °General instructions °· If you have sleep apnea, surgery and certain medicines can increase your risk for breathing problems. Follow instructions from your health care provider about wearing your sleep device: °? Anytime you are sleeping, including during daytime naps. °? While taking prescription pain medicines, sleeping medicines, or medicines that make you drowsy. °· Return to  your normal activities as told by your health care provider. Ask your health care provider what activities are safe for you. °· Take over-the-counter and prescription medicines only as told by your health care provider. °· If you smoke, do not smoke without supervision. °· Keep all follow-up visits as told by your health care provider. This is important. °Contact a health care provider if: °· You have nausea or vomiting that does not get better with medicine. °· You cannot eat or drink without vomiting. °· You have pain that does not get better with medicine. °· You are unable to pass urine. °· You develop a skin rash. °· You have a fever. °· You have redness around your IV site that gets worse. °Get help right away if: °· You have difficulty breathing. °· You have chest pain. °· You have blood in your urine or stool, or you vomit blood. °Summary °· After the procedure, it is common to have a sore throat or nausea. It is also common to feel tired. °· Have a responsible adult stay with you for the first 24 hours after general anesthesia. It is important to have someone help care for you until you are awake and alert. °· When you feel hungry, start by eating small amounts of foods that are soft and easy to digest (bland), such as toast. Gradually return to your regular diet. °· Drink enough fluid to keep your urine pale yellow. °· Return to your normal activities as told by your health care provider. Ask your health care provider what activities are safe for you. °This information is not   intended to replace advice given to you by your health care provider. Make sure you discuss any questions you have with your health care provider. °Document Released: 09/11/2000 Document Revised: 06/08/2017 Document Reviewed: 01/19/2017 °Elsevier Patient Education © 2020 [employer] °Cataract Surgery, Care After °This sheet gives you information about how to care for yourself after your procedure. Your health care provider may also  give you more specific instructions. If you have problems or questions, contact your health care provider. °What can I expect after the procedure? °After the procedure, it is common to have: °· Itching. °· Discomfort. °· Fluid discharge. °· Sensitivity to light and to touch. °· Bruising in or around the eye. °· Mild blurred vision. °Follow these instructions at home: °Eye care ° °· Do not touch or rub your eyes. °· Protect your eyes as told by your health care provider. You may be told to wear a protective eye shield or sunglasses. °· Do not put a contact lens into the affected eye or eyes until your health care provider approves. °· Keep the area around your eye clean and dry: °? Avoid swimming. °? Do not allow water to hit you directly in the face while showering. °? Keep soap and shampoo out of your eyes. °· Check your eye every day for signs of infection. Watch for: °? Redness, swelling, or pain. °? Fluid, blood, or pus. °? Warmth. °? A bad smell. °? Vision that is getting worse. °? Sensitivity that is getting worse. °Activity °· Do not drive for 24 hours if you were given a sedative during your procedure. °· Avoid strenuous activities, such as playing contact sports, for as long as told by your health care provider. °· Do not drive or use heavy machinery until your health care provider approves. °· Do not bend or lift heavy objects. Bending increases pressure in the eye. You can walk, climb stairs, and do light household chores. °· Ask your health care provider when you can return to work. If you work in a dusty environment, you may be advised to wear protective eyewear for a period of time. °General instructions °· Take or apply over-the-counter and prescription medicines only as told by your health care provider. This includes eye drops. °· Keep all follow-up visits as told by your health care provider. This is important. °Contact a health care provider if: °· You have increased bruising around your  eye. °· You have pain that is not helped with medicine. °· You have a fever. °· You have redness, swelling, or pain in your eye. °· You have fluid, blood, or pus coming from your incision. °· Your vision gets worse. °· Your sensitivity to light gets worse. °Get help right away if: °· You have sudden loss of vision. °· You see flashes of light or spots (floaters). °· You have severe eye pain. °· You develop nausea or vomiting. °Summary °· After your procedure, it is common to have itching, discomfort, bruising, fluid discharge, or sensitivity to light. °· Follow instructions from your health care provider about caring for your eye after the procedure. °· Do not rub your eye after the procedure. You may need to wear eye protection or sunglasses. Do not wear contact lenses. Keep the area around your eye clean and dry. °· Avoid activities that require a lot of effort. These include playing sports and lifting heavy objects. °· Contact a health care provider if you have increased bruising, pain that does not go away, or a fever. Get   help right away if you suddenly lose your vision, see flashes of light or spots, or have severe pain in the eye. °This information is not intended to replace advice given to you by your health care provider. Make sure you discuss any questions you have with your health care provider. °Document Released: 12/23/2004 Document Revised: 12/03/2017 Document Reviewed: 12/03/2017 °Elsevier Patient Education © 2020 Elsevier Inc. ° °

## 2019-03-05 ENCOUNTER — Ambulatory Visit: Payer: Medicare Other | Admitting: Anesthesiology

## 2019-03-05 ENCOUNTER — Encounter
Admission: RE | Disposition: A | Discharge status: Home / Self Care | Payer: Self-pay | Source: Home / Self Care | Attending: Ophthalmology

## 2019-03-05 ENCOUNTER — Ambulatory Visit
Admission: RE | Admit: 2019-03-05 | Discharge: 2019-03-05 | Disposition: A | Discharge status: Home / Self Care | Payer: Medicare Other | Attending: Ophthalmology | Admitting: Ophthalmology

## 2019-03-05 ENCOUNTER — Other Ambulatory Visit: Payer: Self-pay

## 2019-03-05 DIAGNOSIS — Z7901 Long term (current) use of anticoagulants: Secondary | ICD-10-CM | POA: Diagnosis not present

## 2019-03-05 DIAGNOSIS — I2510 Atherosclerotic heart disease of native coronary artery without angina pectoris: Secondary | ICD-10-CM | POA: Insufficient documentation

## 2019-03-05 DIAGNOSIS — Z79899 Other long term (current) drug therapy: Secondary | ICD-10-CM | POA: Diagnosis not present

## 2019-03-05 DIAGNOSIS — Z8673 Personal history of transient ischemic attack (TIA), and cerebral infarction without residual deficits: Secondary | ICD-10-CM | POA: Diagnosis not present

## 2019-03-05 DIAGNOSIS — H2512 Age-related nuclear cataract, left eye: Secondary | ICD-10-CM | POA: Insufficient documentation

## 2019-03-05 DIAGNOSIS — I509 Heart failure, unspecified: Secondary | ICD-10-CM | POA: Insufficient documentation

## 2019-03-05 DIAGNOSIS — Z85828 Personal history of other malignant neoplasm of skin: Secondary | ICD-10-CM | POA: Insufficient documentation

## 2019-03-05 DIAGNOSIS — I110 Hypertensive heart disease with heart failure: Secondary | ICD-10-CM | POA: Diagnosis not present

## 2019-03-05 DIAGNOSIS — I4891 Unspecified atrial fibrillation: Secondary | ICD-10-CM | POA: Diagnosis not present

## 2019-03-05 DIAGNOSIS — F419 Anxiety disorder, unspecified: Secondary | ICD-10-CM | POA: Insufficient documentation

## 2019-03-05 DIAGNOSIS — K219 Gastro-esophageal reflux disease without esophagitis: Secondary | ICD-10-CM | POA: Insufficient documentation

## 2019-03-05 DIAGNOSIS — E1136 Type 2 diabetes mellitus with diabetic cataract: Secondary | ICD-10-CM | POA: Insufficient documentation

## 2019-03-05 HISTORY — DX: Motion sickness, initial encounter: T75.3XXA

## 2019-03-05 HISTORY — DX: Gastro-esophageal reflux disease without esophagitis: K21.9

## 2019-03-05 HISTORY — DX: Presence of external hearing-aid: Z97.4

## 2019-03-05 HISTORY — DX: Essential (primary) hypertension: I10

## 2019-03-05 HISTORY — DX: Other specified postprocedural states: R11.2

## 2019-03-05 HISTORY — PX: CATARACT EXTRACTION W/PHACO: SHX586

## 2019-03-05 HISTORY — DX: Other specified postprocedural states: Z98.890

## 2019-03-05 HISTORY — DX: Asymptomatic varicose veins of bilateral lower extremities: I83.93

## 2019-03-05 SURGERY — PHACOEMULSIFICATION, CATARACT, WITH IOL INSERTION
Anesthesia: Monitor Anesthesia Care | Site: Eye | Laterality: Left

## 2019-03-05 MED ORDER — TETRACAINE HCL 0.5 % OP SOLN
1.0000 [drp] | OPHTHALMIC | Status: DC | PRN
  Administered 2019-03-05 (×3): 1 [drp] via OPHTHALMIC

## 2019-03-05 MED ORDER — ARMC OPHTHALMIC DILATING DROPS
1.0000 "application " | OPHTHALMIC | Status: DC | PRN
  Administered 2019-03-05 (×3): 1 via OPHTHALMIC

## 2019-03-05 MED ORDER — MOXIFLOXACIN HCL 0.5 % OP SOLN
OPHTHALMIC | Status: DC | PRN
  Administered 2019-03-05: 0.2 mL via OPHTHALMIC

## 2019-03-05 MED ORDER — ONDANSETRON HCL 4 MG/2ML IJ SOLN: 4.0000 mg | Freq: Once | INTRAMUSCULAR | Status: DC | PRN

## 2019-03-05 MED ORDER — LACTATED RINGERS IV SOLN: 100.0000 mL/h | INTRAVENOUS | Status: DC

## 2019-03-05 MED ORDER — MOXIFLOXACIN HCL 0.5 % OP SOLN
1.0000 [drp] | OPHTHALMIC | Status: DC | PRN
  Administered 2019-03-05 (×3): 1 [drp] via OPHTHALMIC

## 2019-03-05 MED ORDER — MIDAZOLAM HCL 2 MG/2ML IJ SOLN
INTRAMUSCULAR | Status: DC | PRN
  Administered 2019-03-05 (×2): 1 mg via INTRAVENOUS

## 2019-03-05 MED ORDER — NA HYALUR & NA CHOND-NA HYALUR 0.4-0.35 ML IO KIT
PACK | INTRAOCULAR | Status: DC | PRN
  Administered 2019-03-05: 1 mL via INTRAOCULAR

## 2019-03-05 MED ORDER — EPINEPHRINE PF 1 MG/ML IJ SOLN
INTRAOCULAR | Status: DC | PRN
  Administered 2019-03-05: 09:00:00 56 mL via OPHTHALMIC

## 2019-03-05 MED ORDER — ACETAMINOPHEN 160 MG/5ML PO SOLN: 325.0000 mg | ORAL | Status: DC | PRN

## 2019-03-05 MED ORDER — ACETAMINOPHEN 325 MG PO TABS: 325.0000 mg | ORAL_TABLET | ORAL | Status: DC | PRN

## 2019-03-05 MED ORDER — BRIMONIDINE TARTRATE-TIMOLOL 0.2-0.5 % OP SOLN
OPHTHALMIC | Status: DC | PRN
  Administered 2019-03-05: 1 [drp] via OPHTHALMIC

## 2019-03-05 MED ORDER — LIDOCAINE HCL (PF) 2 % IJ SOLN
INTRAOCULAR | Status: DC | PRN
  Administered 2019-03-05: 1 mL

## 2019-03-05 MED ORDER — FENTANYL CITRATE (PF) 100 MCG/2ML IJ SOLN
INTRAMUSCULAR | Status: DC | PRN
  Administered 2019-03-05: 50 ug via INTRAVENOUS

## 2019-03-05 SURGICAL SUPPLY — 26 items
CANNULA ANT/CHMB 27G (MISCELLANEOUS) ×1 IMPLANT
CANNULA ANT/CHMB 27GA (MISCELLANEOUS) ×3 IMPLANT
GLOVE SURG LX 7.5 STRW (GLOVE) ×4
GLOVE SURG LX STRL 7.5 STRW (GLOVE) ×1 IMPLANT
GLOVE SURG TRIUMPH 8.0 PF LTX (GLOVE) ×3 IMPLANT
GOWN STRL REUS W/ TWL LRG LVL3 (GOWN DISPOSABLE) ×2 IMPLANT
GOWN STRL REUS W/TWL LRG LVL3 (GOWN DISPOSABLE) ×4
LENS IOL TECNIS ITEC 22.0 (Intraocular Lens) ×2 IMPLANT
MARKER SKIN DUAL TIP RULER LAB (MISCELLANEOUS) ×3 IMPLANT
NDL FILTER BLUNT 18X1 1/2 (NEEDLE) IMPLANT
NDL RETROBULBAR .5 NSTRL (NEEDLE) IMPLANT
NEEDLE FILTER BLUNT 18X 1/2SAF (NEEDLE)
NEEDLE FILTER BLUNT 18X1 1/2 (NEEDLE) IMPLANT
PACK CATARACT BRASINGTON (MISCELLANEOUS) ×3 IMPLANT
PACK EYE AFTER SURG (MISCELLANEOUS) ×3 IMPLANT
PACK OPTHALMIC (MISCELLANEOUS) ×3 IMPLANT
RING MALYGIN 7.0 (MISCELLANEOUS) IMPLANT
SUT ETHILON 10-0 CS-B-6CS-B-6 (SUTURE)
SUT VICRYL  9 0 (SUTURE)
SUT VICRYL 9 0 (SUTURE) IMPLANT
SUTURE EHLN 10-0 CS-B-6CS-B-6 (SUTURE) IMPLANT
SYR 3ML LL SCALE MARK (SYRINGE) ×3 IMPLANT
SYR 5ML LL (SYRINGE) IMPLANT
SYR TB 1ML LUER SLIP (SYRINGE) ×3 IMPLANT
WATER STERILE IRR 500ML POUR (IV SOLUTION) ×3 IMPLANT
WIPE NON LINTING 3.25X3.25 (MISCELLANEOUS) ×3 IMPLANT

## 2019-03-05 NOTE — Transfer of Care (Signed)
Immediate Anesthesia Transfer of Care Note  Patient: Linda [name]  Procedure(s) Performed: CATARACT EXTRACTION PHACO AND INTRAOCULAR LENS PLACEMENT (IOC)   0:57 15.6% 9.05 (Left Eye)  Patient Location: PACU  Anesthesia Type: MAC  Level of Consciousness: awake, alert  and patient cooperative  Airway and Oxygen Therapy: Patient Spontanous Breathing and Patient connected to supplemental oxygen  Post-op Assessment: Post-op Vital signs reviewed, Patient's Cardiovascular Status Stable, Respiratory Function Stable, Patent Airway and No signs of Nausea or vomiting  Post-op Vital Signs: Reviewed and stable  Complications: No apparent anesthesia complications

## 2019-03-05 NOTE — Anesthesia Procedure Notes (Signed)
Procedure Name: MAC Performed by: Arthuro Canelo M, CRNA Pre-anesthesia Checklist: Timeout performed, Patient being monitored, Suction available, Emergency Drugs available and Patient identified Patient Re-evaluated:Patient Re-evaluated prior to induction Oxygen Delivery Method: Nasal cannula       

## 2019-03-05 NOTE — Op Note (Signed)
OPERATIVE NOTE  SEARRA LEY [id number] 03/05/2019   PREOPERATIVE DIAGNOSIS:  Nuclear sclerotic cataract left eye. H25.12   POSTOPERATIVE DIAGNOSIS:    Nuclear sclerotic cataract left eye.     PROCEDURE:  Phacoemusification with posterior chamber intraocular lens placement of the left eye  Ultrasound time: Procedure(s) with comments: CATARACT EXTRACTION PHACO AND INTRAOCULAR LENS PLACEMENT (IOC)   0:57 15.6% 9.05 (Left) - Diabetic - diet cotrolled  LENS:   Implant Name Type Inv. Item Serial No. Manufacturer Lot No. LRB No. Used Action  LENS IOL DIOP 22.0 - [id number] Intraocular Lens LENS IOL DIOP 22.0 [id number]  Left 1 Implanted      SURGEON:  Wyonia Hough, MD   ANESTHESIA:  Topical with tetracaine drops and 2% Xylocaine jelly, augmented with 1% preservative-free intracameral lidocaine.    COMPLICATIONS:  None.   DESCRIPTION OF PROCEDURE:  The patient was identified in the holding room and transported to the operating room and placed in the supine position under the operating microscope.  The left eye was identified as the operative eye and it was prepped and draped in the usual sterile ophthalmic fashion.   A 1 millimeter clear-corneal paracentesis was made at the 1:30 position.  0.5 ml of preservative-free 1% lidocaine was injected into the anterior chamber.  The anterior chamber was filled with Viscoat viscoelastic.  A 2.4 millimeter keratome was used to make a near-clear corneal incision at the 10:30 position.  .  A curvilinear capsulorrhexis was made with a cystotome and capsulorrhexis forceps.  Balanced salt solution was used to hydrodissect and hydrodelineate the nucleus.   Phacoemulsification was then used in stop and chop fashion to remove the lens nucleus and epinucleus.  The remaining cortex was then removed using the irrigation and aspiration handpiece. Provisc was then placed into the capsular bag to distend it for lens placement.  A lens was then injected  into the capsular bag.  The remaining viscoelastic was aspirated.   Wounds were hydrated with balanced salt solution.  The anterior chamber was inflated to a physiologic pressure with balanced salt solution.  No wound leaks were noted. Vigamox 0.2 ml of a 1mg  per ml solution was injected into the anterior chamber for a dose of 0.2 mg of intracameral antibiotic at the completion of the case.   Timolol and Brimonidine drops were applied to the eye.  The patient was taken to the recovery room in stable condition without complications of anesthesia or surgery.  Isrrael Fluckiger 03/05/2019, 8:43 AM

## 2019-03-05 NOTE — Anesthesia Preprocedure Evaluation (Signed)
Anesthesia Evaluation  Patient identified by MRN, date of birth, ID band Patient awake    Reviewed: Allergy & Precautions, H&P , NPO status , Patient's Chart, lab work & pertinent test results, reviewed documented beta blocker date and time   History of Anesthesia Complications (+) PONV and history of anesthetic complications  Airway Mallampati: II  TM Distance: >3 FB Neck ROM: full    Dental no notable dental hx.    Pulmonary neg pulmonary ROS,    Pulmonary exam normal breath sounds clear to auscultation       Cardiovascular Exercise Tolerance: Good hypertension, Atrial Fibrillation  Rhythm:regular Rate:Normal     Neuro/Psych Anxiety TIA   GI/Hepatic Neg liver ROS, GERD  ,  Endo/Other  diabetes  Renal/GU negative Renal ROS  negative genitourinary   Musculoskeletal   Abdominal   Peds  Hematology negative hematology ROS (+)   Anesthesia Other Findings   Reproductive/Obstetrics negative OB ROS                             Anesthesia Physical Anesthesia Plan  ASA: III  Anesthesia Plan: MAC   Post-op Pain Management:    Induction:   PONV Risk Score and Plan:   Airway Management Planned:   Additional Equipment:   Intra-op Plan:   Post-operative Plan:   Informed Consent: I have reviewed the patients History and Physical, chart, labs and discussed the procedure including the risks, benefits and alternatives for the proposed anesthesia with the patient or authorized representative who has indicated his/her understanding and acceptance.     Dental Advisory Given  Plan Discussed with: CRNA  Anesthesia Plan Comments:         Anesthesia Quick Evaluation

## 2019-03-05 NOTE — H&P (Signed)

## 2019-03-05 NOTE — Anesthesia Postprocedure Evaluation (Signed)
Anesthesia Post Note  Patient: Linda [name]  Procedure(s) Performed: CATARACT EXTRACTION PHACO AND INTRAOCULAR LENS PLACEMENT (IOC)   0:57 15.6% 9.05 (Left Eye)  Patient location during evaluation: PACU Anesthesia Type: MAC Level of consciousness: awake and alert Pain management: pain level controlled Vital Signs Assessment: post-procedure vital signs reviewed and stable Respiratory status: spontaneous breathing, nonlabored ventilation, respiratory function stable and patient connected to nasal cannula oxygen Cardiovascular status: stable and blood pressure returned to baseline Postop Assessment: no apparent nausea or vomiting Anesthetic complications: no    Alisa Graff

## 2019-03-06 ENCOUNTER — Encounter: Payer: Self-pay | Admitting: Ophthalmology

## 2019-04-02 ENCOUNTER — Ambulatory Visit (INDEPENDENT_AMBULATORY_CARE_PROVIDER_SITE_OTHER): Payer: Medicare Other | Admitting: Internal Medicine

## 2019-04-02 ENCOUNTER — Ambulatory Visit: Payer: Medicare Other

## 2019-04-02 ENCOUNTER — Encounter: Payer: Self-pay | Admitting: Internal Medicine

## 2019-04-02 ENCOUNTER — Other Ambulatory Visit: Payer: Self-pay

## 2019-04-02 VITALS — BP 120/68 | Ht 63.0 in | Wt 170.0 lb

## 2019-04-02 DIAGNOSIS — K2950 Unspecified chronic gastritis without bleeding: Secondary | ICD-10-CM

## 2019-04-02 DIAGNOSIS — I517 Cardiomegaly: Secondary | ICD-10-CM

## 2019-04-02 DIAGNOSIS — I10 Essential (primary) hypertension: Secondary | ICD-10-CM | POA: Diagnosis not present

## 2019-04-02 DIAGNOSIS — Z1231 Encounter for screening mammogram for malignant neoplasm of breast: Secondary | ICD-10-CM

## 2019-04-02 DIAGNOSIS — E1121 Type 2 diabetes mellitus with diabetic nephropathy: Secondary | ICD-10-CM

## 2019-04-02 NOTE — Anesthesia Preprocedure Evaluation (Addendum)
Anesthesia Evaluation  Patient identified by MRN, date of birth, ID band Patient awake    Reviewed: Allergy & Precautions, H&P , NPO status , Patient's Chart, lab work & pertinent test results  History of Anesthesia Complications (+) PONV and history of anesthetic complications  Airway Mallampati: III  TM Distance: >3 FB Neck ROM: Limited    Dental no notable dental hx.    Pulmonary    breath sounds clear to auscultation       Cardiovascular Exercise Tolerance: Good hypertension, (-) angina+ Peripheral Vascular Disease  (-) DOE + dysrhythmias (a fib on Eliquis) Atrial Fibrillation  Rhythm:Irregular Rate:Normal   Cardiomyopathy HLD  TTE (0/0923): MILD LV SYSTOLIC DYSFUNCTION (EF 30-07%) WITH MILD LVH NORMAL RIGHT VENTRICULAR SYSTOLIC FUNCTION NO VALVULAR STENOSIS MODERATE MR, MODERATE TR, MILD PR MODERATE PHTN Contraction: REGIONALLY IMPAIRED   Neuro/Psych PSYCHIATRIC DISORDERS Anxiety TIA   GI/Hepatic GERD  Medicated and Controlled, Diverticulosis   Endo/Other  diabetes, Type 2  Renal/GU Renal disease (Kidney stones)     Musculoskeletal   Abdominal (+) + obese (BMI 30),   Peds  Hematology   Anesthesia Other Findings Skin cancer  Reproductive/Obstetrics                          Anesthesia Physical  Anesthesia Plan  ASA: III  Anesthesia Plan: MAC   Post-op Pain Management:    Induction: Intravenous  PONV Risk Score and Plan: 3 and TIVA, Midazolam, Treatment may vary due to age or medical condition and Ondansetron  Airway Management Planned: Nasal Cannula  Additional Equipment:   Intra-op Plan:   Post-operative Plan:   Informed Consent: I have reviewed the patients History and Physical, chart, labs and discussed the procedure including the risks, benefits and alternatives for the proposed anesthesia with the patient or authorized representative who has indicated his/her  understanding and acceptance.       Plan Discussed with: CRNA and Anesthesiologist  Anesthesia Plan Comments:        Anesthesia Quick Evaluation

## 2019-04-02 NOTE — Progress Notes (Signed)
Telephone Note   This visit type was conducted due to national recommendations for restrictions regarding the COVID-19 pandemic (e.g. social distancing).  This format is felt to be most appropriate for this patient at this time.  All issues noted in this document were discussed and addressed.  No physical exam was performed (except for noted visual exam findings with Video Visits).   I connected with@ on 04/02/19 at 10:30 AM EDT by  telephone and verified that I am speaking with the correct person using two identifiers. Location patient: home Location provider: work or home office Persons participating in the virtual visit: patient, provider  I discussed the limitations, risks, security and privacy concerns of performing an evaluation and management service by telephone and the availability of in person appointments. I also discussed with the patient that there may be a patient responsible charge related to this service. The patient expressed understanding and agreed to proceed.   Reason for visit: follow up  HPI:   Had a covid 19 exposure on sept 27  Has not had any symptoms ,  Has been doing yardwork.   Anxiety/depression aggravated by isolation.  Still lonely.  Having trouble finding a companion through an agency.  Has had several women out that she did not care for.  Cough resolved with treatment of reflux and dietary changes. .  Intermittent episodes of back pain across mid thoracic area (along the bra line)   ROS: See pertinent positives and negatives per HPI.  Past Medical History:  Diagnosis Date  . Anxiety   . Arrhythmia    paroxysmal atrial fibrillation  . Colon polyp   . Diabetes mellitus, type 2 (HCC)    diet controlled  . Diverticulitis   . GERD (gastroesophageal reflux disease)   . Hard of hearing   . Hyperlipidemia   . Hypertension   . Kidney stone   . Motion sickness   . Parathyroid disease ([place])    Parathyroidectomy   . PONV (postoperative nausea and  vomiting)   . PVC (premature ventricular contraction)   . Skin cancer   . TIA (transient ischemic attack) 04/2018   no deficitis  . UTI (lower urinary tract infection)   . Varicose veins of both lower extremities   . Wears hearing aid in both ears     Past Surgical History:  Procedure Laterality Date  . ABDOMINAL HYSTERECTOMY    . CATARACT EXTRACTION W/PHACO Left 03/05/2019   Procedure: CATARACT EXTRACTION PHACO AND INTRAOCULAR LENS PLACEMENT (IOC)   0:57 15.6% 9.05;  Surgeon: Leandrew Koyanagi, MD;  Location: [facility];  Service: Ophthalmology;  Laterality: Left;  Diabetic - diet cotrolled  . CHOLECYSTECTOMY  2002  . LITHOTRIPSY    . PARATHYROIDECTOMY  2004   1 removed  . ROTATOR CUFF REPAIR  2002  . TONSILLECTOMY  1956  . TONSILLECTOMY    . TOTAL ABDOMINAL HYSTERECTOMY W/ BILATERAL SALPINGOOPHORECTOMY  1984  . VEIN LIGATION AND STRIPPING      Family History  Problem Relation Age of Onset  . Heart disease Brother   . Stroke Mother   . Skin cancer Father   . Breast cancer Paternal Grandmother     SOCIAL HX:  reports that she has never smoked. She has never used smokeless tobacco. She reports that she does not drink alcohol or use drugs.   Current Outpatient Medications:  .  ALPRAZolam (XANAX) 0.25 MG tablet, Take 0.25 mg by mouth at bedtime as needed for anxiety., Disp: , Rfl:  .  cetirizine (ZYRTEC) 10 MG tablet, Take 10 mg by mouth daily as needed for allergies., Disp: , Rfl:  .  chlorthalidone (HYGROTON) 25 MG tablet, Take 25 mg by mouth daily. , Disp: , Rfl:  .  Cholecalciferol (VITAMIN D) 2000 units tablet, Take 2,000 Units by mouth daily., Disp: , Rfl:  .  ELIQUIS 5 MG TABS tablet, TK 1 T PO BID, Disp: , Rfl:  .  estradiol (ESTRACE) 0.1 MG/GM vaginal cream, Place 1 g vaginally 3 (three) times a week. Pea sized amount per urethra, Disp: 42.5 g, Rfl: 12 .  famotidine (PEPCID) 40 MG tablet, Take 40 mg by mouth at bedtime. , Disp: , Rfl:  .  feeding  supplement (BOOST HIGH PROTEIN) LIQD, Take 1 Container by mouth daily., Disp: , Rfl:  .  MAGNESIUM GLYCINATE PO, Take 200 mg by mouth at bedtime as needed., Disp: , Rfl:  .  Meclizine HCl (BONINE PO), Take by mouth as needed., Disp: , Rfl:  .  metroNIDAZOLE (METROCREAM) 0.75 % cream, Apply 1 application topically 2 (two) times daily. , Disp: , Rfl: 5 .  triamcinolone cream (KENALOG) 0.1 %, Apply 1 application topically 2 (two) times daily., Disp: 30 g, Rfl: 0 .  furosemide (LASIX) 20 MG tablet, TAKE 1 TABLET BY MOUTH EVERY DAY (Patient not taking: Reported on 04/02/2019), Disp: 30 tablet, Rfl: 2 .  omeprazole (PRILOSEC) 40 MG capsule, Take 1 capsule (40 mg total) by mouth 2 (two) times daily at 10 AM and 5 PM. (Patient not taking: Reported on 02/26/2019), Disp: 180 capsule, Rfl: 1  EXAM:  VITALS per patient if applicable:  GENERAL: alert, oriented, appears well and in no acute distress  HEENT: atraumatic, conjunttiva clear, no obvious abnormalities on inspection of external nose and ears  NECK: normal movements of the head and neck  LUNGS: on inspection no signs of respiratory distress, breathing rate appears normal, no obvious gross SOB, gasping or wheezing  CV: no obvious cyanosis  MS: moves all visible extremities without noticeable abnormality  PSYCH/NEURO: pleasant and cooperative, no obvious depression or anxiety, speech and thought processing grossly intact  ASSESSMENT AND PLAN:  Discussed the following assessment and plan:  Encounter for screening mammogram for breast cancer - Plan: MM 3D SCREEN BREAST BILATERAL  VENTRICULAR HYPERTROPHY, LEFT  Other chronic gastritis without hemorrhage  Essential hypertension  VENTRICULAR HYPERTROPHY, LEFT BP well controlled on chlorthalidone alone.    Gastritis Resolved with anti reflux medication and dietary measures   Essential hypertension No longer Taking losartan 50 mg bid,  Just  Chlorthalidone added in April by  Cardiology    I discussed the assessment and treatment plan with the patient. The patient was provided an opportunity to ask questions and all were answered. The patient agreed with the plan and demonstrated an understanding of the instructions.   The patient was advised to call back or seek an in-person evaluation if the symptoms worsen or if the condition fails to improve as anticipated.  I  provided  25 minutes of non-face-to-face time during this encounter reviewing patient's current problems and post surgeries.  Providing counseling on the above mentioned problems , and coordination  of care . Linda Mc, MD

## 2019-04-02 NOTE — Assessment & Plan Note (Signed)
Resolved with anti reflux medication and dietary measures

## 2019-04-02 NOTE — Assessment & Plan Note (Signed)
No longer Taking losartan 50 mg bid,  Just  Chlorthalidone added in April by Cardiology

## 2019-04-02 NOTE — Assessment & Plan Note (Signed)
BP well controlled on chlorthalidone alone.

## 2019-04-02 NOTE — Assessment & Plan Note (Signed)
Well controlled on diet alone.    She  has microalbuminuria and cerebrovascular disease  And is already taking an ARB. She is intolerant of all statins   due to leg pain .  Aspirin is relatively contraindicated since she is already taking Eliquis . Repeat labs in Dec 2020    Lab Results  Component Value Date   HGBA1C 6.8 (H) 11/27/2018   Lab Results  Component Value Date   MICROALBUR 7.2 (H) 11/27/2018

## 2019-04-04 ENCOUNTER — Ambulatory Visit (INDEPENDENT_AMBULATORY_CARE_PROVIDER_SITE_OTHER): Payer: Medicare Other

## 2019-04-04 ENCOUNTER — Other Ambulatory Visit
Admission: RE | Admit: 2019-04-04 | Discharge: 2019-04-04 | Disposition: A | Discharge status: Home / Self Care | Payer: Medicare Other | Source: Ambulatory Visit | Attending: Ophthalmology | Admitting: Ophthalmology

## 2019-04-04 ENCOUNTER — Other Ambulatory Visit: Payer: Self-pay

## 2019-04-04 DIAGNOSIS — Z23 Encounter for immunization: Secondary | ICD-10-CM

## 2019-04-04 DIAGNOSIS — Z01812 Encounter for preprocedural laboratory examination: Secondary | ICD-10-CM | POA: Insufficient documentation

## 2019-04-04 DIAGNOSIS — Z20828 Contact with and (suspected) exposure to other viral communicable diseases: Secondary | ICD-10-CM | POA: Diagnosis not present

## 2019-04-05 LAB — SARS CORONAVIRUS 2 (TAT 6-24 HRS): SARS Coronavirus 2: NEGATIVE

## 2019-04-07 NOTE — Discharge Instructions (Signed)

## 2019-04-09 ENCOUNTER — Encounter
Admission: RE | Disposition: A | Discharge status: Home / Self Care | Payer: Self-pay | Source: Home / Self Care | Attending: Ophthalmology

## 2019-04-09 ENCOUNTER — Other Ambulatory Visit: Payer: Self-pay

## 2019-04-09 ENCOUNTER — Ambulatory Visit
Admission: RE | Admit: 2019-04-09 | Discharge: 2019-04-09 | Disposition: A | Discharge status: Home / Self Care | Payer: Medicare Other | Attending: Ophthalmology | Admitting: Ophthalmology

## 2019-04-09 ENCOUNTER — Ambulatory Visit: Payer: Medicare Other | Admitting: Anesthesiology

## 2019-04-09 DIAGNOSIS — Z880 Allergy status to penicillin: Secondary | ICD-10-CM | POA: Insufficient documentation

## 2019-04-09 DIAGNOSIS — H2511 Age-related nuclear cataract, right eye: Secondary | ICD-10-CM | POA: Insufficient documentation

## 2019-04-09 DIAGNOSIS — I4891 Unspecified atrial fibrillation: Secondary | ICD-10-CM | POA: Diagnosis not present

## 2019-04-09 DIAGNOSIS — E1151 Type 2 diabetes mellitus with diabetic peripheral angiopathy without gangrene: Secondary | ICD-10-CM | POA: Insufficient documentation

## 2019-04-09 DIAGNOSIS — K219 Gastro-esophageal reflux disease without esophagitis: Secondary | ICD-10-CM | POA: Insufficient documentation

## 2019-04-09 DIAGNOSIS — Z85828 Personal history of other malignant neoplasm of skin: Secondary | ICD-10-CM | POA: Insufficient documentation

## 2019-04-09 DIAGNOSIS — E1136 Type 2 diabetes mellitus with diabetic cataract: Secondary | ICD-10-CM | POA: Insufficient documentation

## 2019-04-09 DIAGNOSIS — E785 Hyperlipidemia, unspecified: Secondary | ICD-10-CM | POA: Insufficient documentation

## 2019-04-09 DIAGNOSIS — Z882 Allergy status to sulfonamides status: Secondary | ICD-10-CM | POA: Diagnosis not present

## 2019-04-09 DIAGNOSIS — I10 Essential (primary) hypertension: Secondary | ICD-10-CM | POA: Insufficient documentation

## 2019-04-09 DIAGNOSIS — Z8673 Personal history of transient ischemic attack (TIA), and cerebral infarction without residual deficits: Secondary | ICD-10-CM | POA: Diagnosis not present

## 2019-04-09 DIAGNOSIS — E7800 Pure hypercholesterolemia, unspecified: Secondary | ICD-10-CM | POA: Diagnosis not present

## 2019-04-09 DIAGNOSIS — F419 Anxiety disorder, unspecified: Secondary | ICD-10-CM | POA: Diagnosis not present

## 2019-04-09 DIAGNOSIS — I429 Cardiomyopathy, unspecified: Secondary | ICD-10-CM | POA: Diagnosis not present

## 2019-04-09 DIAGNOSIS — Z7901 Long term (current) use of anticoagulants: Secondary | ICD-10-CM | POA: Diagnosis not present

## 2019-04-09 HISTORY — PX: CATARACT EXTRACTION W/PHACO: SHX586

## 2019-04-09 SURGERY — PHACOEMULSIFICATION, CATARACT, WITH IOL INSERTION
Anesthesia: Monitor Anesthesia Care | Site: Eye | Laterality: Right

## 2019-04-09 MED ORDER — FENTANYL CITRATE (PF) 100 MCG/2ML IJ SOLN
INTRAMUSCULAR | Status: DC | PRN
  Administered 2019-04-09: 50 ug via INTRAVENOUS

## 2019-04-09 MED ORDER — MOXIFLOXACIN HCL 0.5 % OP SOLN
OPHTHALMIC | Status: DC | PRN
  Administered 2019-04-09: 0.2 mL via OPHTHALMIC

## 2019-04-09 MED ORDER — MIDAZOLAM HCL 2 MG/2ML IJ SOLN
INTRAMUSCULAR | Status: DC | PRN
  Administered 2019-04-09: 1 mg via INTRAVENOUS

## 2019-04-09 MED ORDER — LACTATED RINGERS IV SOLN: 100.0000 mL/h | INTRAVENOUS | Status: DC

## 2019-04-09 MED ORDER — NA HYALUR & NA CHOND-NA HYALUR 0.4-0.35 ML IO KIT
PACK | INTRAOCULAR | Status: DC | PRN
  Administered 2019-04-09: 1 mL via INTRAOCULAR

## 2019-04-09 MED ORDER — BRIMONIDINE TARTRATE-TIMOLOL 0.2-0.5 % OP SOLN
OPHTHALMIC | Status: DC | PRN
  Administered 2019-04-09: 1 [drp] via OPHTHALMIC

## 2019-04-09 MED ORDER — LIDOCAINE HCL (CARDIAC) PF 100 MG/5ML IV SOSY
PREFILLED_SYRINGE | INTRAVENOUS | Status: DC | PRN
  Administered 2019-04-09: 50 mg via INTRAVENOUS

## 2019-04-09 MED ORDER — ARMC OPHTHALMIC DILATING DROPS
1.0000 "application " | OPHTHALMIC | Status: DC | PRN
  Administered 2019-04-09 (×2): 1 via OPHTHALMIC

## 2019-04-09 MED ORDER — LIDOCAINE HCL (PF) 2 % IJ SOLN
INTRAOCULAR | Status: DC | PRN
  Administered 2019-04-09: 1 mL

## 2019-04-09 MED ORDER — EPINEPHRINE PF 1 MG/ML IJ SOLN
INTRAOCULAR | Status: DC | PRN
  Administered 2019-04-09: 51 mL via OPHTHALMIC

## 2019-04-09 MED ORDER — MOXIFLOXACIN HCL 0.5 % OP SOLN
1.0000 [drp] | OPHTHALMIC | Status: DC | PRN
  Administered 2019-04-09 (×3): 1 [drp] via OPHTHALMIC

## 2019-04-09 MED ORDER — TETRACAINE HCL 0.5 % OP SOLN
1.0000 [drp] | OPHTHALMIC | Status: DC | PRN
  Administered 2019-04-09 (×3): 1 [drp] via OPHTHALMIC

## 2019-04-09 SURGICAL SUPPLY — 16 items
CANNULA ANT/CHMB 27G (MISCELLANEOUS) ×1 IMPLANT
CANNULA ANT/CHMB 27GA (MISCELLANEOUS) ×3 IMPLANT
GLOVE SURG LX 7.5 STRW (GLOVE) ×2
GLOVE SURG LX STRL 7.5 STRW (GLOVE) ×1 IMPLANT
GLOVE SURG TRIUMPH 8.0 PF LTX (GLOVE) ×3 IMPLANT
GOWN STRL REUS W/ TWL LRG LVL3 (GOWN DISPOSABLE) ×2 IMPLANT
GOWN STRL REUS W/TWL LRG LVL3 (GOWN DISPOSABLE) ×4
LENS IOL TECNIS ITEC 21.0 (Intraocular Lens) ×2 IMPLANT
MARKER SKIN DUAL TIP RULER LAB (MISCELLANEOUS) ×3 IMPLANT
PACK CATARACT BRASINGTON (MISCELLANEOUS) ×3 IMPLANT
PACK EYE AFTER SURG (MISCELLANEOUS) ×3 IMPLANT
PACK OPTHALMIC (MISCELLANEOUS) ×3 IMPLANT
SYR 3ML LL SCALE MARK (SYRINGE) ×3 IMPLANT
SYR TB 1ML LUER SLIP (SYRINGE) ×3 IMPLANT
WATER STERILE IRR 500ML POUR (IV SOLUTION) ×3 IMPLANT
WIPE NON LINTING 3.25X3.25 (MISCELLANEOUS) ×3 IMPLANT

## 2019-04-09 NOTE — Transfer of Care (Signed)
Immediate Anesthesia Transfer of Care Note  Patient: Linda [name]  Procedure(s) Performed: CATARACT EXTRACTION PHACO AND INTRAOCULAR LENS PLACEMENT (IOC) RIGHT DIABETIC 00:47.6  15.9%  7.60 (Right Eye)  Patient Location: PACU  Anesthesia Type: MAC  Level of Consciousness: awake, alert  and patient cooperative  Airway and Oxygen Therapy: Patient Spontanous Breathing and Patient connected to supplemental oxygen  Post-op Assessment: Post-op Vital signs reviewed, Patient's Cardiovascular Status Stable, Respiratory Function Stable, Patent Airway and No signs of Nausea or vomiting  Post-op Vital Signs: Reviewed and stable  Complications: No apparent anesthesia complications

## 2019-04-09 NOTE — Anesthesia Procedure Notes (Signed)
Procedure Name: MAC Date/Time: 04/09/2019 9:07 AM Performed by: Cameron Ali, CRNA Pre-anesthesia Checklist: Patient identified, Emergency Drugs available, Suction available, Timeout performed and Patient being monitored Patient Re-evaluated:Patient Re-evaluated prior to induction Oxygen Delivery Method: Nasal cannula Placement Confirmation: positive ETCO2

## 2019-04-09 NOTE — Anesthesia Postprocedure Evaluation (Signed)
Anesthesia Post Note  Patient: Linda [name]  Procedure(s) Performed: CATARACT EXTRACTION PHACO AND INTRAOCULAR LENS PLACEMENT (IOC) RIGHT DIABETIC 00:47.6  15.9%  7.60 (Right Eye)  Patient location during evaluation: PACU Anesthesia Type: MAC Level of consciousness: awake and alert Pain management: pain level controlled Vital Signs Assessment: post-procedure vital signs reviewed and stable Respiratory status: spontaneous breathing, nonlabored ventilation, respiratory function stable and patient connected to nasal cannula oxygen Cardiovascular status: stable and blood pressure returned to baseline Postop Assessment: no apparent nausea or vomiting Anesthetic complications: no    Linda [name]  Linda [name]

## 2019-04-09 NOTE — Op Note (Signed)
LOCATION:  [place]   PREOPERATIVE DIAGNOSIS:    Nuclear sclerotic cataract right eye. H25.11   POSTOPERATIVE DIAGNOSIS:  Nuclear sclerotic cataract right eye.     PROCEDURE:  Phacoemusification with posterior chamber intraocular lens placement of the right eye   ULTRASOUND TIME: Procedure(s): CATARACT EXTRACTION PHACO AND INTRAOCULAR LENS PLACEMENT (IOC) RIGHT DIABETIC 00:47.6  15.9%  7.60 (Right)  LENS:   Implant Name Type Inv. Item Serial No. Manufacturer Lot No. LRB No. Used Action  LENS IOL DIOP 21.0 - [id number] Intraocular Lens LENS IOL DIOP 21.0 [id number]  Right 1 Implanted         SURGEON:  Wyonia Hough, MD   ANESTHESIA:  Topical with tetracaine drops and 2% Xylocaine jelly, augmented with 1% preservative-free intracameral lidocaine.    COMPLICATIONS:  None.   DESCRIPTION OF PROCEDURE:  The patient was identified in the holding room and transported to the operating room and placed in the supine position under the operating microscope.  The right eye was identified as the operative eye and it was prepped and draped in the usual sterile ophthalmic fashion.   A 1 millimeter clear-corneal paracentesis was made at the 12:00 position.  0.5 ml of preservative-free 1% lidocaine was injected into the anterior chamber. The anterior chamber was filled with Viscoat viscoelastic.  A 2.4 millimeter keratome was used to make a near-clear corneal incision at the 9:00 position.  A curvilinear capsulorrhexis was made with a cystotome and capsulorrhexis forceps.  Balanced salt solution was used to hydrodissect and hydrodelineate the nucleus.   Phacoemulsification was then used in stop and chop fashion to remove the lens nucleus and epinucleus.  The remaining cortex was then removed using the irrigation and aspiration handpiece. Provisc was then placed into the capsular bag to distend it for lens placement.  A lens was then injected into the capsular bag.  The remaining  viscoelastic was aspirated.   Wounds were hydrated with balanced salt solution.  The anterior chamber was inflated to a physiologic pressure with balanced salt solution.  No wound leaks were noted. Vigamox 0.2 ml of a 1mg  per ml solution was injected into the anterior chamber for a dose of 0.2 mg of intracameral antibiotic at the completion of the case.\  Timolol and Brimonidine drops were applied to the eye.  The patient was taken to the recovery room in stable condition without complications of anesthesia or surgery.   Yamil Oelke 04/09/2019, 9:27 AM

## 2019-04-09 NOTE — H&P (Signed)

## 2019-04-10 ENCOUNTER — Encounter: Payer: Self-pay | Admitting: Ophthalmology

## 2019-04-22 ENCOUNTER — Ambulatory Visit: Payer: Medicare Other

## 2019-04-28 NOTE — Telephone Encounter (Signed)
Patient stated that she has tried some tylenol and thinks that is causing her urine to smell. Patient wants to wait and see if urine clears up since talking with her daughter that is a [employer] scientist.  P[atient did ask if you would refill her lorazepam due to anxiety over COVID she cannot sleep at night.

## 2019-04-29 ENCOUNTER — Other Ambulatory Visit: Payer: Self-pay | Admitting: Internal Medicine

## 2019-04-29 DIAGNOSIS — R8290 Unspecified abnormal findings in urine: Secondary | ICD-10-CM

## 2019-04-29 MED ORDER — LORAZEPAM 0.5 MG PO TABS: 0.5000 mg | ORAL_TABLET | Freq: Every evening | ORAL | 0 refills | Status: DC | PRN

## 2019-05-14 ENCOUNTER — Ambulatory Visit
Admission: RE | Admit: 2019-05-14 | Discharge: 2019-05-14 | Disposition: A | Discharge status: Home / Self Care | Payer: Medicare Other | Source: Ambulatory Visit | Attending: Internal Medicine | Admitting: Internal Medicine

## 2019-05-14 DIAGNOSIS — Z1231 Encounter for screening mammogram for malignant neoplasm of breast: Secondary | ICD-10-CM | POA: Diagnosis not present

## 2019-08-07 ENCOUNTER — Other Ambulatory Visit: Payer: Self-pay

## 2019-08-07 ENCOUNTER — Encounter: Payer: Self-pay | Admitting: Emergency Medicine

## 2019-08-07 ENCOUNTER — Emergency Department
Admission: EM | Admit: 2019-08-07 | Discharge: 2019-08-07 | Disposition: A | Discharge status: Home / Self Care | Payer: Medicare Other | Attending: Emergency Medicine | Admitting: Emergency Medicine

## 2019-08-07 DIAGNOSIS — I10 Essential (primary) hypertension: Secondary | ICD-10-CM | POA: Insufficient documentation

## 2019-08-07 DIAGNOSIS — Z8673 Personal history of transient ischemic attack (TIA), and cerebral infarction without residual deficits: Secondary | ICD-10-CM | POA: Insufficient documentation

## 2019-08-07 DIAGNOSIS — Z79899 Other long term (current) drug therapy: Secondary | ICD-10-CM | POA: Insufficient documentation

## 2019-08-07 DIAGNOSIS — R040 Epistaxis: Secondary | ICD-10-CM | POA: Diagnosis present

## 2019-08-07 DIAGNOSIS — E119 Type 2 diabetes mellitus without complications: Secondary | ICD-10-CM | POA: Insufficient documentation

## 2019-08-07 DIAGNOSIS — Z7901 Long term (current) use of anticoagulants: Secondary | ICD-10-CM | POA: Insufficient documentation

## 2019-08-07 DIAGNOSIS — Z85828 Personal history of other malignant neoplasm of skin: Secondary | ICD-10-CM | POA: Diagnosis not present

## 2019-08-07 DIAGNOSIS — Z9049 Acquired absence of other specified parts of digestive tract: Secondary | ICD-10-CM | POA: Insufficient documentation

## 2019-08-07 MED ORDER — CEPHALEXIN 500 MG PO CAPS: 500.0000 mg | ORAL_CAPSULE | Freq: Two times a day (BID) | ORAL | 0 refills | Status: DC

## 2019-08-07 NOTE — ED Triage Notes (Signed)
Patient ambulatory to triage with steady gait, without difficulty or distress noted, mask in place; pt reports rt nosebleed x 3 tonight; currently taking eloquist; denies any c/o or recent illness

## 2019-08-07 NOTE — ED Provider Notes (Signed)
[facility] Emergency Department Provider Note   ____________________________________________    I have reviewed the triage vital signs and the nursing notes.   HISTORY  Chief Complaint Epistaxis     HPI Linda [name] is a 86 y.o. female with history of diabetes, hypertension, paroxysmal atrial fibrillation on Eliquis who presents with epistaxis.  Patient reports her right naris has bled 3 times tonight.  She has been able to control it with cotton balls but she is concerned and frustrated as it started bleeding again.  No lightheadedness.  No abdominal pain nausea or vomiting.  No headache.  Reports typically she puts Vaseline inside her nose but she has forgotten the last couple of days  Past Medical History:  Diagnosis Date  . Anxiety   . Arrhythmia    paroxysmal atrial fibrillation  . Colon polyp   . Diabetes mellitus, type 2 (HCC)    diet controlled  . Diverticulitis   . GERD (gastroesophageal reflux disease)   . Hard of hearing   . Hyperlipidemia   . Hypertension   . Kidney stone   . Motion sickness   . Parathyroid disease ([place])    Parathyroidectomy   . PONV (postoperative nausea and vomiting)   . PVC (premature ventricular contraction)   . Skin cancer   . TIA (transient ischemic attack) 04/2018   no deficitis  . UTI (lower urinary tract infection)   . Varicose veins of both lower extremities   . Wears hearing aid in both ears     Patient Active Problem List   Diagnosis Date Noted  . Skin lesion of hand 08/27/2018  . Hospital discharge follow-up 05/25/2018  . Abnormal CT of the chest 05/25/2018  . History of CVA (cerebrovascular accident) without residual deficits 05/10/2018  . Parent-child estrangement Delma Post 02/09/2018  . Basal cell carcinoma (BCC) of right lower extremity 08/08/2017  . Hematuria, gross 08/08/2017  . Extremity atherosclerosis with intermittent claudication ([place]) 08/08/2017  . Generalized anxiety disorder  01/31/2017  . 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 Microalbuminuria due to type 2 diabetes mellitus ([place]) 04/30/2016  . Type 2 diabetes mellitus ([place]) 01/20/2016  . Insomnia secondary to anxiety 10/09/2015  . Secondary erythrocytosis 07/04/2015  . Pre-syncope 07/02/2015  . Osteopenia 08/21/2014  . Gastritis 05/26/2014  . GERD (gastroesophageal reflux disease) 03/04/2014  . Medicare annual wellness visit, subsequent 01/13/2014  . Essential hypertension 11/04/2013  . Screening for breast cancer 07/01/2013  . Left shoulder pain 03/21/2013  . Hoarseness of voice 03/21/2013  . Edema 09/05/2010  . VENTRICULAR HYPERTROPHY, LEFT 08/16/2010  . Hyperlipidemia 06/03/2010  . ATRIAL FIBRILLATION 08/13/2009    Past Surgical History:  Procedure Laterality Date  . ABDOMINAL HYSTERECTOMY    . CATARACT EXTRACTION W/PHACO Left 03/05/2019   Procedure: CATARACT EXTRACTION PHACO AND INTRAOCULAR LENS PLACEMENT (IOC)   0:57 15.6% 9.05;  Surgeon: Leandrew Koyanagi, MD;  Location: [place];  Service: Ophthalmology;  Laterality: Left;  Diabetic - diet cotrolled  . CATARACT EXTRACTION W/PHACO Right 04/09/2019   Procedure: CATARACT EXTRACTION PHACO AND INTRAOCULAR LENS PLACEMENT (IOC) RIGHT DIABETIC 00:47.6  15.9%  7.60;  Surgeon: Leandrew Koyanagi, MD;  Location: [place];  Service: Ophthalmology;  Laterality: Right;  . CHOLECYSTECTOMY  2002  . LITHOTRIPSY    . PARATHYROIDECTOMY  2004   1 removed  . ROTATOR CUFF REPAIR  2002  . TONSILLECTOMY  1956  . TONSILLECTOMY    . TOTAL ABDOMINAL HYSTERECTOMY W/ BILATERAL SALPINGOOPHORECTOMY  1984  .  VEIN LIGATION AND STRIPPING      Prior to Admission medications   Medication Sig Start Date End Date Taking? Authorizing Provider  ALPRAZolam Duanne Moron) 0.25 MG tablet Take 0.25 mg by mouth at bedtime as needed for anxiety.    [provider]  cephALEXin (KEFLEX) 500 MG capsule Take 1 capsule (500 mg total) by mouth 2 (two) times  daily. 08/07/19   Lavonia Drafts, MD  cetirizine (ZYRTEC) 10 MG tablet Take 10 mg by mouth daily as needed for allergies.    [provider]  chlorthalidone (HYGROTON) 25 MG tablet Take 25 mg by mouth daily.  10/08/18   [provider]  Cholecalciferol (VITAMIN D) 2000 units tablet Take 2,000 Units by mouth daily.    [provider]  ELIQUIS 5 MG TABS tablet TK 1 T PO BID 08/10/18   [provider]  estradiol (ESTRACE) 0.1 MG/GM vaginal cream Place 1 g vaginally 3 (three) times a week. Pea sized amount per urethra 06/27/17   Hollice Espy, MD  famotidine (PEPCID) 40 MG tablet Take 40 mg by mouth at bedtime.  05/20/18   [provider]  feeding supplement (BOOST HIGH PROTEIN) LIQD Take 1 Container by mouth daily.    [provider]  furosemide (LASIX) 20 MG tablet TAKE 1 TABLET BY MOUTH EVERY DAY Patient not taking: Reported on 04/02/2019 06/24/18   Crecencio Mc, MD  LORazepam (ATIVAN) 0.5 MG tablet Take 1 tablet (0.5 mg total) by mouth at bedtime as needed for anxiety. 04/29/19   Crecencio Mc, MD  MAGNESIUM GLYCINATE PO Take 200 mg by mouth at bedtime as needed.    [provider]  Meclizine HCl (BONINE PO) Take by mouth as needed.    [provider]  metroNIDAZOLE (METROCREAM) 0.75 % cream Apply 1 application topically 2 (two) times daily.  08/02/17   [provider]  omeprazole (PRILOSEC) 40 MG capsule Take 1 capsule (40 mg total) by mouth 2 (two) times daily at 10 AM and 5 PM. Patient not taking: Reported on 02/26/2019 11/23/18   Crecencio Mc, MD  triamcinolone cream (KENALOG) 0.1 % Apply 1 application topically 2 (two) times daily. 08/26/18   Crecencio Mc, MD     Allergies Barium iodide, Crestor [rosuvastatin calcium], Penicillins, Sulfa antibiotics, and Sulfonamide derivatives  Family History  Problem Relation Age of Onset  . Heart disease Brother   . Stroke Mother   . Skin cancer Father   . Breast cancer  Paternal Grandmother     Social History Social History   Tobacco Use  . Smoking status: Never Smoker  . Smokeless tobacco: Never Used  Substance Use Topics  . Alcohol use: No  . Drug use: No    Review of Systems  Constitutional: No fever/chills Eyes: No visual changes.  ENT: as above Cardiovascular: Denies chest pain. Respiratory: Denies shortness of breath. Gastrointestinal: No abdominal pain.    Genitourinary: Negative for dysuria. Musculoskeletal: Negative for back pain. Skin: Negative for rash. Neurological: Negative for headaches    ____________________________________________   PHYSICAL EXAM:  VITAL SIGNS: ED Triage Vitals  Enc Vitals Group     BP 08/07/19 0537 (!) 186/91     Pulse Rate 08/07/19 0537 (!) 47     Resp 08/07/19 0537 18     Temp --      Temp src --      SpO2 08/07/19 0537 95 %     Weight 08/07/19 0531 81.6 kg (180 lb)  Height 08/07/19 0531 1.6 m (5\' 3" )     Head Circumference --      Peak Flow --      Pain Score 08/07/19 0531 0     Pain Loc --      Pain Edu? --      Excl. in [place]? --     Constitutional: Alert and oriented.   Nose: Cottonball in right nose, not saturated Mouth/Throat: Mucous membranes are moist.  No active bleeding posterior pharynx  Cardiovascular Good peripheral circulation. Respiratory: Normal respiratory effort.   Musculoskeletal:  Warm and well perfused Neurologic:  Normal speech and language. No gross focal neurologic deficits are appreciated.  Skin:  Skin is warm, dry and intact. No rash noted. Psychiatric: Mood and affect are normal. Speech and behavior are normal.  ____________________________________________   LABS (all labs ordered are listed, but only abnormal results are displayed)  Labs Reviewed - No data to  display ____________________________________________  EKG  None ____________________________________________  RADIOLOGY  None ____________________________________________   PROCEDURES  Procedure(s) performed: yes  .Epistaxis Management  Date/Time: 08/07/2019 6:34 AM Performed by: Lavonia Drafts, MD Authorized by: Lavonia Drafts, MD   Consent:    Consent obtained:  Verbal   Consent given by:  Patient   Risks discussed:  Bleeding, infection, nasal injury and pain   Alternatives discussed:  No treatment Anesthesia (see MAR for exact dosages):    Anesthesia method:  Topical application   Topical anesthetic:  Lidocaine gel Procedure details:    Treatment site:  R anterior   Treatment method:  Merocel sponge Post-procedure details:    Assessment:  Bleeding stopped   Patient tolerance of procedure:  Tolerated well, no immediate complications     Critical Care performed: No ____________________________________________   INITIAL IMPRESSION / ASSESSMENT AND PLAN / ED COURSE  Pertinent labs & imaging results that were available during my care of the patient were reviewed by me and considered in my medical decision making (see chart for details).  Patient presents with epistaxis, history of Eliquis.  After cottonball removed it appears that bleeding is controlled at this time.  Have placed new cotton ball saturated with Afrin and viscous lidocaine in case she requires nasal tampon.  Patient opted to have Merocel placed given multiple episodes last night.       ____________________________________________   FINAL CLINICAL IMPRESSION(S) / ED DIAGNOSES  Final diagnoses:  Epistaxis        Note:  This document was prepared using Dragon voice recognition software and may include unintentional dictation errors.   Lavonia Drafts, MD 08/07/19 [phone]

## 2019-08-18 ENCOUNTER — Other Ambulatory Visit: Admission: RE | Admit: 2019-08-18 | Payer: Medicare Other | Source: Ambulatory Visit

## 2019-10-29 ENCOUNTER — Encounter: Admission: RE | Payer: Self-pay | Source: Home / Self Care

## 2019-10-29 ENCOUNTER — Ambulatory Visit: Admission: RE | Admit: 2019-10-29 | Payer: Medicare Other | Source: Home / Self Care | Admitting: Internal Medicine

## 2019-10-29 SURGERY — ESOPHAGOGASTRODUODENOSCOPY (EGD) WITH PROPOFOL
Anesthesia: General

## 2019-11-04 LAB — HM DIABETES EYE EXAM

## 2019-12-15 ENCOUNTER — Other Ambulatory Visit: Payer: Self-pay

## 2019-12-15 ENCOUNTER — Other Ambulatory Visit
Admission: RE | Admit: 2019-12-15 | Discharge: 2019-12-15 | Disposition: A | Discharge status: Home / Self Care | Payer: Medicare Other | Source: Ambulatory Visit | Attending: General Surgery | Admitting: General Surgery

## 2019-12-15 ENCOUNTER — Encounter: Payer: Self-pay | Admitting: Internal Medicine

## 2019-12-15 ENCOUNTER — Ambulatory Visit (INDEPENDENT_AMBULATORY_CARE_PROVIDER_SITE_OTHER): Payer: Medicare Other | Admitting: Internal Medicine

## 2019-12-15 VITALS — BP 128/74 | HR 76 | Temp 97.8°F | Resp 15 | Ht 63.0 in | Wt 179.2 lb

## 2019-12-15 DIAGNOSIS — R5383 Other fatigue: Secondary | ICD-10-CM

## 2019-12-15 DIAGNOSIS — E1121 Type 2 diabetes mellitus with diabetic nephropathy: Secondary | ICD-10-CM | POA: Diagnosis not present

## 2019-12-15 DIAGNOSIS — E538 Deficiency of other specified B group vitamins: Secondary | ICD-10-CM | POA: Diagnosis not present

## 2019-12-15 DIAGNOSIS — D582 Other hemoglobinopathies: Secondary | ICD-10-CM | POA: Diagnosis not present

## 2019-12-15 DIAGNOSIS — Z20822 Contact with and (suspected) exposure to covid-19: Secondary | ICD-10-CM | POA: Diagnosis not present

## 2019-12-15 DIAGNOSIS — E1129 Type 2 diabetes mellitus with other diabetic kidney complication: Secondary | ICD-10-CM

## 2019-12-15 DIAGNOSIS — T466X5A Adverse effect of antihyperlipidemic and antiarteriosclerotic drugs, initial encounter: Secondary | ICD-10-CM

## 2019-12-15 DIAGNOSIS — Z01812 Encounter for preprocedural laboratory examination: Secondary | ICD-10-CM | POA: Diagnosis present

## 2019-12-15 DIAGNOSIS — D751 Secondary polycythemia: Secondary | ICD-10-CM

## 2019-12-15 DIAGNOSIS — I4811 Longstanding persistent atrial fibrillation: Secondary | ICD-10-CM

## 2019-12-15 DIAGNOSIS — R809 Proteinuria, unspecified: Secondary | ICD-10-CM

## 2019-12-15 DIAGNOSIS — D6869 Other thrombophilia: Secondary | ICD-10-CM | POA: Insufficient documentation

## 2019-12-15 DIAGNOSIS — I10 Essential (primary) hypertension: Secondary | ICD-10-CM

## 2019-12-15 DIAGNOSIS — M791 Myalgia, unspecified site: Secondary | ICD-10-CM

## 2019-12-15 LAB — COMPREHENSIVE METABOLIC PANEL
ALT: 17 U/L (ref 0–35)
AST: 21 U/L (ref 0–37)
Albumin: 4.1 g/dL (ref 3.5–5.2)
Alkaline Phosphatase: 52 U/L (ref 39–117)
BUN: 26 mg/dL — ABNORMAL HIGH (ref 6–23)
CO2: 33 mEq/L — ABNORMAL HIGH (ref 19–32)
Calcium: 9.8 mg/dL (ref 8.4–10.5)
Chloride: 99 mEq/L (ref 96–112)
Creatinine, Ser: 0.91 mg/dL (ref 0.40–1.20)
GFR: 58.88 mL/min — ABNORMAL LOW (ref >60.00)
Glucose, Bld: 143 mg/dL — ABNORMAL HIGH (ref 70–99)
Potassium: 3.4 mEq/L — ABNORMAL LOW (ref 3.5–5.1)
Sodium: 139 mEq/L (ref 135–145)
Total Bilirubin: 1.5 mg/dL — ABNORMAL HIGH (ref 0.2–1.2)
Total Protein: 6.7 g/dL (ref 6.0–8.3)

## 2019-12-15 LAB — CBC WITH DIFFERENTIAL/PLATELET
Basophils Absolute: 0 10*3/uL (ref 0.0–0.1)
Basophils Relative: 0.9 % (ref 0.0–3.0)
Eosinophils Absolute: 0 10*3/uL (ref 0.0–0.7)
Eosinophils Relative: 1 % (ref 0.0–5.0)
HCT: 43.9 % (ref 36.0–46.0)
Hemoglobin: 15.1 g/dL — ABNORMAL HIGH (ref 12.0–15.0)
Lymphocytes Relative: 24.2 % (ref 12.0–46.0)
Lymphs Abs: 1.1 10*3/uL (ref 0.7–4.0)
MCHC: 34.4 g/dL (ref 30.0–36.0)
MCV: 95.7 fl (ref 78.0–100.0)
Monocytes Absolute: 0.5 10*3/uL (ref 0.1–1.0)
Monocytes Relative: 10.3 % (ref 3.0–12.0)
Neutro Abs: 2.9 10*3/uL (ref 1.4–7.7)
Neutrophils Relative %: 63.6 % (ref 43.0–77.0)
Platelets: 165 10*3/uL (ref 150.0–400.0)
RBC: 4.59 Mil/uL (ref 3.87–5.11)
RDW: 13.9 % (ref 11.5–15.5)
WBC: 4.5 10*3/uL (ref 4.0–10.5)

## 2019-12-15 LAB — MICROALBUMIN / CREATININE URINE RATIO
Creatinine,U: 102.6 mg/dL
Microalb Creat Ratio: 1.4 mg/g (ref 0.0–30.0)
Microalb, Ur: 1.4 mg/dL (ref 0.0–1.9)

## 2019-12-15 LAB — HEMOGLOBIN A1C: Hgb A1c MFr Bld: 6.6 % — ABNORMAL HIGH (ref 4.6–6.5)

## 2019-12-15 LAB — LIPID PANEL
Cholesterol: 186 mg/dL (ref 0–200)
HDL: 63.1 mg/dL (ref >39.00)
LDL Cholesterol: 109 mg/dL — ABNORMAL HIGH (ref 0–99)
NonHDL: 122.77
Total CHOL/HDL Ratio: 3
Triglycerides: 67 mg/dL (ref 0.0–149.0)
VLDL: 13.4 mg/dL (ref 0.0–40.0)

## 2019-12-15 LAB — SARS CORONAVIRUS 2 (TAT 6-24 HRS): SARS Coronavirus 2: NEGATIVE

## 2019-12-15 LAB — VITAMIN B12: Vitamin B-12: 424 pg/mL (ref 211–911)

## 2019-12-15 MED ORDER — ALPRAZOLAM 0.25 MG PO TABS: 0.2500 mg | ORAL_TABLET | Freq: Every evening | ORAL | 3 refills | Status: DC | PRN

## 2019-12-15 MED ORDER — LOSARTAN POTASSIUM 25 MG PO TABS: 25.0000 mg | ORAL_TABLET | Freq: Every day | ORAL | 1 refills | Status: DC

## 2019-12-15 NOTE — Assessment & Plan Note (Signed)
She is  tolerating Eliquis for management of stroke risk

## 2019-12-15 NOTE — Assessment & Plan Note (Signed)
Well controlled,  With frequent hypotensive episodes causing malaise.  Advised to reduce chlorthalidone to every other day and continue losartan given microalbuminuria.

## 2019-12-15 NOTE — Assessment & Plan Note (Signed)
Well controlled.  Anticoagulated on Eliquis (treatment failure on coumadin,  Had CVA)

## 2019-12-15 NOTE — Assessment & Plan Note (Signed)
Chronic, managed with surveillance currently.  Used to donate blood in the past

## 2019-12-15 NOTE — Patient Instructions (Addendum)
You can take 1/2 alprazolam if you cannot go back to sleep after 1 am.  Reduce the  Chlorthalidone to EVERY OTHER DAY.  Continue losartan every night   You should try to walk  up to 20 minutes daily.  House chores do not count !   You had normal carotid artery doppler test in 2017

## 2019-12-15 NOTE — Progress Notes (Signed)
Subjective:  Patient ID: Linda [name], female    DOB: [birth year]  Age: 86 y.o. MRN: [id number]  CC: The primary encounter diagnosis was Type 2 diabetes mellitus with diabetic nephropathy, without long-term current use of insulin ([place]). Diagnoses of Fatigue, unspecified type, Elevated hemoglobin ([place]), B12 deficiency, Myalgia due to statin, Acquired thrombophilia ([place]), Controlled type 2 diabetes mellitus with microalbuminuria, without long-term current use of insulin ([place]), Longstanding persistent atrial fibrillation ([place]), Essential hypertension, and Polycythemia, secondary were also pertinent to this visit.  HPI Linda [name] presents for FOLLOW UP on diabetes,  Hypertension and GAD   This visit occurred during the SARS-CoV-2 public health emergency.  Safety protocols were in place, including screening questions prior to the visit, additional usage of staff PPE, and extensive cleaning of exam room while observing appropriate contact time as indicated for disinfecting solutions.   Patient has received both doses of the  [place] 19 vaccine without complications.  She states that she has not felt well since receiving the vaccine in February.  Patient continues to mask when outside of the home except when walking in yard or at safe distances from others .  Patient denies any change in mood or development of unhealthy behaviors resuting from the pandemic's restriction of activities and socialization.   Acquired thrombophilia:  Taking Eliquis for management of increased stroke risk due to atrial fibrillation    T2DM:  She  feels generally well,  But is not  exercising regularly due to chronic dizziness. . She has gained 10 lb since her visit in October. Her a1c had risen from 6.3 to 6.8 last year,  And is due for repeat check.  She is Checking  blood sugars less than once daily at variable times, usually only if she feels she may be having a hypoglycemic event. . .  Denies any recent hypoglyemic  events.  Taking   medications as directed. Following a carbohydrate modified diet 6 days per week. Denies numbness, burning and tingling of extremities. Appetite is good.    FAtigue:  Naps during the day .  Falls asleep easily but has nocturia x 3 .  Hard to fall back after 1 am.    Hypertension: patient checks blood pressure twice weekly at home. feels very bad when systolic is < 250,  VISION GETS BLURRED TRANSIENTLY Has had readings in the 539 systolic for the last 2 months,  occurring always in the morning  Readings have been for the most part < 40/80 at rest . Patient is following a reduce salt diet most days and is taking medications as prescribed  Gastritis:  For EGD this week   Outpatient Medications Prior to Visit  Medication Sig Dispense Refill  . cetirizine (ZYRTEC) 10 MG tablet Take 10 mg by mouth daily as needed for allergies.    . chlorthalidone (HYGROTON) 25 MG tablet Take 25 mg by mouth daily.     . Cholecalciferol (VITAMIN D) 2000 units tablet Take 2,000 Units by mouth daily.    Marland Kitchen ELIQUIS 5 MG TABS tablet TK 1 T PO BID    . estradiol (ESTRACE) 0.1 MG/GM vaginal cream Place 1 g vaginally 3 (three) times a week. Pea sized amount per urethra 42.5 g 12  . feeding supplement (BOOST HIGH PROTEIN) LIQD Take 1 Container by mouth daily.    Marland Kitchen LORazepam (ATIVAN) 0.5 MG tablet Take 1 tablet (0.5 mg total) by mouth at bedtime as needed for anxiety. 30 tablet 0  .  MAGNESIUM GLYCINATE PO Take 200 mg by mouth at bedtime as needed.    . Meclizine HCl (BONINE PO) Take by mouth as needed.    . metroNIDAZOLE (METROCREAM) 0.75 % cream Apply 1 application topically 2 (two) times daily.   5  . pantoprazole (PROTONIX) 40 MG tablet Take 40 mg by mouth 2 (two) times daily.    Marland Kitchen triamcinolone cream (KENALOG) 0.1 % Apply 1 application topically 2 (two) times daily. 30 g 0  . ALPRAZolam (XANAX) 0.25 MG tablet Take 0.25 mg by mouth at bedtime as needed for anxiety.    Marland Kitchen losartan (COZAAR) 25 MG tablet Take  25 mg by mouth 2 (two) times daily.    Marland Kitchen omeprazole (PRILOSEC) 40 MG capsule Take 1 capsule (40 mg total) by mouth 2 (two) times daily at 10 AM and 5 PM. (Patient not taking: Reported on 12/15/2019) 180 capsule 1  . cephALEXin (KEFLEX) 500 MG capsule Take 1 capsule (500 mg total) by mouth 2 (two) times daily. (Patient not taking: Reported on 12/15/2019) 14 capsule 0  . famotidine (PEPCID) 40 MG tablet Take 40 mg by mouth at bedtime.  (Patient not taking: Reported on 12/15/2019)    . furosemide (LASIX) 20 MG tablet TAKE 1 TABLET BY MOUTH EVERY DAY (Patient not taking: Reported on 04/02/2019) 30 tablet 2   No facility-administered medications prior to visit.    Review of Systems;  Patient denies headache, fevers, malaise, unintentional weight loss, skin rash, eye pain, sinus congestion and sinus pain, sore throat, dysphagia,  hemoptysis , cough, dyspnea, wheezing, chest pain, palpitations, orthopnea, edema, abdominal pain, nausea, melena, diarrhea, constipation, flank pain, dysuria, hematuria, urinary  Frequency, nocturia, numbness, tingling, seizures,  Focal weakness, Loss of consciousness,  Tremor, insomnia, depression, anxiety, and suicidal ideation.      Objective:  BP 128/74 (BP Location: Left Arm, Patient Position: Sitting, Cuff Size: Normal)   Pulse 76   Temp 97.8 F (36.6 C) (Temporal)   Resp 15   Ht 5\' 3"  (1.6 m)   Wt 179 lb 3.2 oz (81.3 kg)   SpO2 95%   BMI 31.74 kg/m   BP Readings from Last 3 Encounters:  12/15/19 128/74  08/07/19 (!) 142/81  04/09/19 (!) 143/94    Wt Readings from Last 3 Encounters:  12/15/19 179 lb 3.2 oz (81.3 kg)  08/07/19 180 lb (81.6 kg)  04/09/19 172 lb (78 kg)    General appearance: alert, cooperative and appears stated age Ears: normal TM's and external ear canals both ears Throat: lips, mucosa, and tongue normal; teeth and gums normal Neck: no adenopathy, no carotid bruit, supple, symmetrical, trachea midline and thyroid not enlarged,  symmetric, no tenderness/mass/nodules Back: symmetric, no curvature. ROM normal. No CVA tenderness. Lungs: clear to auscultation bilaterally Heart: regular rate and rhythm, S1, S2 normal, no murmur, click, rub or gallop Abdomen: soft, non-tender; bowel sounds normal; no masses,  no organomegaly Pulses: 2+ and symmetric Skin: Skin color, texture, turgor normal. No rashes or lesions Lymph nodes: Cervical, supraclavicular, and axillary nodes normal.  Lab Results  Component Value Date   HGBA1C 6.8 (H) 11/27/2018   HGBA1C 6.3 (H) 05/11/2018   HGBA1C 6.3 (A) 02/07/2018    Lab Results  Component Value Date   CREATININE 0.84 11/27/2018   CREATININE 0.80 05/10/2018   CREATININE 0.90 02/07/2018    Lab Results  Component Value Date   WBC 4.8 11/27/2018   HGB 15.8 (H) 11/27/2018   HCT 46.6 (H) 11/27/2018   PLT  172.0 11/27/2018   GLUCOSE 138 (H) 11/27/2018   CHOL 210 (H) 11/27/2018   TRIG 56.0 11/27/2018   HDL 72.90 11/27/2018   LDLDIRECT 90.0 10/23/2016   LDLCALC 125 (H) 11/27/2018   ALT 17 11/27/2018   AST 22 11/27/2018   NA 140 11/27/2018   K 3.3 (L) 11/27/2018   CL 98 11/27/2018   CREATININE 0.84 11/27/2018   BUN 23 11/27/2018   CO2 33 (H) 11/27/2018   TSH 2.09 11/27/2018   INR 1.78 05/11/2018   HGBA1C 6.8 (H) 11/27/2018   MICROALBUR 7.2 (H) 11/27/2018    No results found.  Assessment & Plan:   Problem List Items Addressed This Visit      Unprioritized   Polycythemia, secondary    Chronic, managed with surveillance currently.  Used to donate blood in the past       Myalgia due to statin   Essential hypertension    Well controlled,  With frequent hypotensive episodes causing malaise.  Advised to reduce chlorthalidone to every other day and continue losartan given microalbuminuria.       Relevant Medications   losartan (COZAAR) 25 MG tablet   Controlled type 2 diabetes mellitus with microalbuminuria, without long-term current use of insulin ([place]) - Primary     Well controlled on diet alone.    She  has microalbuminuria and cerebrovascular disease  She has developed early neuroapthy type symptoms on the soles of her feet. She is already taking an ARB. She is intolerant of all statins   due to leg pain .  Aspirin is relatively contraindicated since she is already taking Eliquis . Repeat labs in Dec 2020    Lab Results  Component Value Date   HGBA1C 6.8 (H) 11/27/2018   Lab Results  Component Value Date   MICROALBUR 7.2 (H) 11/27/2018         Relevant Medications   losartan (COZAAR) 25 MG tablet   ATRIAL FIBRILLATION    Well controlled.  Anticoagulated on Eliquis (treatment failure on coumadin,  Had CVA)      Relevant Medications   losartan (COZAAR) 25 MG tablet   Acquired thrombophilia (HCC)    She is  tolerating Eliquis for management of stroke risk        Other Visit Diagnoses    Fatigue, unspecified type       Relevant Orders   Comprehensive metabolic panel   Elevated hemoglobin (HCC)       Relevant Orders   CBC with Differential/Platelet   B12 deficiency       Relevant Orders   Vitamin B12     I provided  30 minutes of  face-to-face time during this encounter reviewing patient's current problems and past surgeries, labs and imaging studies, providing counseling on the above mentioned problems , and coordination  of care .  I have discontinued Jaidah M. Laumann's famotidine, furosemide, and cephALEXin. I have also changed her losartan and ALPRAZolam. Additionally, I am having her maintain her Vitamin D, feeding supplement, estradiol, metroNIDAZOLE, Eliquis, triamcinolone cream, chlorthalidone, omeprazole, MAGNESIUM GLYCINATE PO, Meclizine HCl (BONINE PO), cetirizine, LORazepam, and pantoprazole.  Meds ordered this encounter  Medications  . losartan (COZAAR) 25 MG tablet    Sig: Take 1 tablet (25 mg total) by mouth daily.    Dispense:  90 tablet    Refill:  1  . ALPRAZolam (XANAX) 0.25 MG tablet    Sig: Take 1 tablet (0.25 mg  total) by mouth at bedtime as needed for  anxiety.    Dispense:  30 tablet    Refill:  3    Medications Discontinued During This Encounter  Medication Reason  . cephALEXin (KEFLEX) 500 MG capsule Completed Course  . famotidine (PEPCID) 40 MG tablet Patient has not taken in last 30 days  . furosemide (LASIX) 20 MG tablet Patient has not taken in last 30 days  . losartan (COZAAR) 25 MG tablet   . ALPRAZolam (XANAX) 0.25 MG tablet Reorder    Follow-up: No follow-ups on file.   Crecencio Mc, MD

## 2019-12-15 NOTE — Assessment & Plan Note (Signed)
Well controlled on diet alone.    She  has microalbuminuria and cerebrovascular disease  She has developed early neuroapthy type symptoms on the soles of her feet. She is already taking an ARB. She is intolerant of all statins   due to leg pain .  Aspirin is relatively contraindicated since she is already taking Eliquis . Repeat labs in Dec 2020    Lab Results  Component Value Date   HGBA1C 6.8 (H) 11/27/2018   Lab Results  Component Value Date   MICROALBUR 7.2 (H) 11/27/2018

## 2019-12-16 ENCOUNTER — Other Ambulatory Visit: Payer: Self-pay | Admitting: Internal Medicine

## 2019-12-16 ENCOUNTER — Encounter: Payer: Self-pay | Admitting: Internal Medicine

## 2019-12-16 ENCOUNTER — Encounter: Payer: Self-pay | Admitting: General Surgery

## 2019-12-16 DIAGNOSIS — E876 Hypokalemia: Secondary | ICD-10-CM | POA: Insufficient documentation

## 2019-12-16 DIAGNOSIS — T502X5A Adverse effect of carbonic-anhydrase inhibitors, benzothiadiazides and other diuretics, initial encounter: Secondary | ICD-10-CM | POA: Insufficient documentation

## 2019-12-16 MED ORDER — POTASSIUM CHLORIDE CRYS ER 20 MEQ PO TBCR: 20.0000 meq | EXTENDED_RELEASE_TABLET | Freq: Every day | ORAL | 3 refills | Status: DC

## 2019-12-17 ENCOUNTER — Other Ambulatory Visit: Payer: Self-pay

## 2019-12-17 ENCOUNTER — Ambulatory Visit: Payer: Medicare Other | Admitting: Certified Registered Nurse Anesthetist

## 2019-12-17 ENCOUNTER — Encounter
Admission: RE | Disposition: A | Discharge status: Home / Self Care | Payer: Self-pay | Source: Home / Self Care | Attending: General Surgery

## 2019-12-17 ENCOUNTER — Encounter: Payer: Self-pay | Admitting: General Surgery

## 2019-12-17 ENCOUNTER — Ambulatory Visit
Admission: RE | Admit: 2019-12-17 | Discharge: 2019-12-17 | Disposition: A | Discharge status: Home / Self Care | Payer: Medicare Other | Attending: General Surgery | Admitting: General Surgery

## 2019-12-17 DIAGNOSIS — R1012 Left upper quadrant pain: Secondary | ICD-10-CM | POA: Diagnosis not present

## 2019-12-17 DIAGNOSIS — Z8673 Personal history of transient ischemic attack (TIA), and cerebral infarction without residual deficits: Secondary | ICD-10-CM | POA: Diagnosis not present

## 2019-12-17 DIAGNOSIS — Z882 Allergy status to sulfonamides status: Secondary | ICD-10-CM | POA: Diagnosis not present

## 2019-12-17 DIAGNOSIS — Z90710 Acquired absence of both cervix and uterus: Secondary | ICD-10-CM | POA: Diagnosis not present

## 2019-12-17 DIAGNOSIS — I739 Peripheral vascular disease, unspecified: Secondary | ICD-10-CM | POA: Insufficient documentation

## 2019-12-17 DIAGNOSIS — I480 Paroxysmal atrial fibrillation: Secondary | ICD-10-CM | POA: Insufficient documentation

## 2019-12-17 DIAGNOSIS — Z9842 Cataract extraction status, left eye: Secondary | ICD-10-CM | POA: Insufficient documentation

## 2019-12-17 DIAGNOSIS — K219 Gastro-esophageal reflux disease without esophagitis: Secondary | ICD-10-CM | POA: Diagnosis not present

## 2019-12-17 DIAGNOSIS — E892 Postprocedural hypoparathyroidism: Secondary | ICD-10-CM | POA: Insufficient documentation

## 2019-12-17 DIAGNOSIS — I493 Ventricular premature depolarization: Secondary | ICD-10-CM | POA: Insufficient documentation

## 2019-12-17 DIAGNOSIS — K449 Diaphragmatic hernia without obstruction or gangrene: Secondary | ICD-10-CM | POA: Diagnosis not present

## 2019-12-17 DIAGNOSIS — I110 Hypertensive heart disease with heart failure: Secondary | ICD-10-CM | POA: Insufficient documentation

## 2019-12-17 DIAGNOSIS — E119 Type 2 diabetes mellitus without complications: Secondary | ICD-10-CM | POA: Diagnosis not present

## 2019-12-17 DIAGNOSIS — E785 Hyperlipidemia, unspecified: Secondary | ICD-10-CM | POA: Insufficient documentation

## 2019-12-17 DIAGNOSIS — I509 Heart failure, unspecified: Secondary | ICD-10-CM | POA: Insufficient documentation

## 2019-12-17 DIAGNOSIS — R490 Dysphonia: Secondary | ICD-10-CM | POA: Diagnosis not present

## 2019-12-17 DIAGNOSIS — Z880 Allergy status to penicillin: Secondary | ICD-10-CM | POA: Diagnosis not present

## 2019-12-17 DIAGNOSIS — F419 Anxiety disorder, unspecified: Secondary | ICD-10-CM | POA: Diagnosis not present

## 2019-12-17 DIAGNOSIS — K2950 Unspecified chronic gastritis without bleeding: Secondary | ICD-10-CM | POA: Insufficient documentation

## 2019-12-17 DIAGNOSIS — R1013 Epigastric pain: Secondary | ICD-10-CM | POA: Diagnosis not present

## 2019-12-17 DIAGNOSIS — K317 Polyp of stomach and duodenum: Secondary | ICD-10-CM | POA: Insufficient documentation

## 2019-12-17 DIAGNOSIS — Z961 Presence of intraocular lens: Secondary | ICD-10-CM | POA: Insufficient documentation

## 2019-12-17 DIAGNOSIS — Z79899 Other long term (current) drug therapy: Secondary | ICD-10-CM | POA: Insufficient documentation

## 2019-12-17 DIAGNOSIS — Z888 Allergy status to other drugs, medicaments and biological substances status: Secondary | ICD-10-CM | POA: Diagnosis not present

## 2019-12-17 DIAGNOSIS — Z87442 Personal history of urinary calculi: Secondary | ICD-10-CM | POA: Diagnosis not present

## 2019-12-17 DIAGNOSIS — Z9841 Cataract extraction status, right eye: Secondary | ICD-10-CM | POA: Insufficient documentation

## 2019-12-17 DIAGNOSIS — Z9049 Acquired absence of other specified parts of digestive tract: Secondary | ICD-10-CM | POA: Insufficient documentation

## 2019-12-17 HISTORY — DX: Cerebral infarction, unspecified: I63.9

## 2019-12-17 HISTORY — DX: Dysphonia: R49.0

## 2019-12-17 HISTORY — DX: Prediabetes: R73.03

## 2019-12-17 HISTORY — DX: Heart failure, unspecified: I50.9

## 2019-12-17 HISTORY — DX: Cardiac arrhythmia, unspecified: I49.9

## 2019-12-17 HISTORY — PX: ESOPHAGOGASTRODUODENOSCOPY (EGD) WITH PROPOFOL: SHX5813

## 2019-12-17 SURGERY — ESOPHAGOGASTRODUODENOSCOPY (EGD) WITH PROPOFOL
Anesthesia: General

## 2019-12-17 MED ORDER — SODIUM CHLORIDE 0.9 % IV SOLN: INTRAVENOUS | Status: DC

## 2019-12-17 MED ORDER — PROPOFOL 500 MG/50ML IV EMUL
INTRAVENOUS | Status: DC | PRN
  Administered 2019-12-17: 150 ug/kg/min via INTRAVENOUS

## 2019-12-17 MED ORDER — PROPOFOL 10 MG/ML IV BOLUS
INTRAVENOUS | Status: DC | PRN
  Administered 2019-12-17: 50 mg via INTRAVENOUS

## 2019-12-17 MED ORDER — LIDOCAINE HCL (CARDIAC) PF 100 MG/5ML IV SOSY
PREFILLED_SYRINGE | INTRAVENOUS | Status: DC | PRN
  Administered 2019-12-17: 100 mg via INTRAVENOUS

## 2019-12-17 MED ORDER — ELIQUIS 5 MG PO TABS: 5.0000 mg | ORAL_TABLET | Freq: Two times a day (BID) | ORAL | 0 refills | Status: DC

## 2019-12-17 NOTE — Op Note (Signed)
[facility] Gastroenterology Patient Name: Linda [name] Procedure Date: 12/17/2019 9:06 AM MRN: [id number] Account #: [id number] Date of Birth: [birth year] Admit Type: Outpatient Age: 86 Room: [facility] ENDO ROOM 1 Gender: Female Note Status: Finalized Procedure:             Upper GI endoscopy Indications:           Epigastric abdominal pain, Abdominal pain in the left                         upper quadrant Providers:             Robert Bellow, MD Medicines:             Monitored Anesthesia Care Complications:         No immediate complications. Procedure:             Pre-Anesthesia Assessment:                        - Prior to the procedure, a History and Physical was                         performed, and patient medications, allergies and                         sensitivities were reviewed. The patient's tolerance                         of previous anesthesia was reviewed.                        - The risks and benefits of the procedure and the                         sedation options and risks were discussed with the                         patient. All questions were answered and informed                         consent was obtained.                        After obtaining informed consent, the endoscope was                         passed under direct vision. Throughout the procedure,                         the patient's blood pressure, pulse, and oxygen                         saturations were monitored continuously. The Endoscope                         was introduced through the mouth, and advanced to the                         second part of duodenum. The upper GI endoscopy was  accomplished without difficulty. The patient tolerated                         the procedure well. Findings:      The esophagus was normal.      The examined duodenum was normal.      A small hiatal hernia was present.      Multiple 5 to 8 mm sessile  polyps with no bleeding and no stigmata of       recent bleeding were found in the gastric body. Biopsies were taken with       a cold forceps for histology.      Few non-bleeding superficial gastric ulcers with pigmented material were       found in the gastric antrum. The largest lesion was 1 mm in largest       dimension. Impression:            - Normal esophagus.                        - Normal examined duodenum.                        - Small hiatal hernia.                        - Multiple gastric polyps. Biopsied.                        - Chronic gastritis. Biopsied. Recommendation:        - Telephone endoscopist for pathology results in 1                         week. Procedure Code(s):     --- Professional ---                        [phone], Esophagogastroduodenoscopy, flexible,                         transoral; with biopsy, single or multiple Diagnosis Code(s):     --- Professional ---                        K44.9, Diaphragmatic hernia without obstruction or                         gangrene                        K31.7, Polyp of stomach and duodenum                        K29.50, Unspecified chronic gastritis without bleeding                        R10.13, Epigastric pain                        R10.12, Left upper quadrant pain CPT copyright 2019 American Medical Association. All rights reserved. The codes documented in this report are preliminary and upon coder review may  be revised to meet current compliance requirements. Robert Bellow, MD 12/17/2019 9:33:39 AM This report has been signed  electronically. Number of Addenda: 0 Note Initiated On: 12/17/2019 9:06 AM Estimated Blood Loss:  Estimated blood loss was minimal.      [facility]

## 2019-12-17 NOTE — Discharge Instructions (Signed)
Restart Eliquis on Saturday morning, July 3rd.

## 2019-12-17 NOTE — H&P (Signed)
Linda [name] [id number] 1933/09/06     HPI:  86 y/o woman with LUQ/ epigastric pain. Improvement with dietary modification and PPI/ carafate. For EGD. Off Coumadin x 5 days.   Medications Prior to Admission  Medication Sig Dispense Refill Last Dose  . cetirizine (ZYRTEC) 10 MG tablet Take 10 mg by mouth daily as needed for allergies.   Past Week at Unknown time  . chlorthalidone (HYGROTON) 25 MG tablet Take 25 mg by mouth daily.    12/16/2019 at 0800  . Cholecalciferol (VITAMIN D) 2000 units tablet Take 2,000 Units by mouth daily.   12/16/2019 at 0800  . ELIQUIS 5 MG TABS tablet TK 1 T PO BID   12/13/2019 at Unknown time  . estradiol (ESTRACE) 0.1 MG/GM vaginal cream Place 1 g vaginally 3 (three) times a week. Pea sized amount per urethra 42.5 g 12 Past Week at Unknown time  . feeding supplement (BOOST HIGH PROTEIN) LIQD Take 1 Container by mouth daily.   Past Week at Unknown time  . LORazepam (ATIVAN) 0.5 MG tablet Take 1 tablet (0.5 mg total) by mouth at bedtime as needed for anxiety. 30 tablet 0 12/16/2019 at 2200  . losartan (COZAAR) 25 MG tablet Take 1 tablet (25 mg total) by mouth daily. 90 tablet 1 12/16/2019 at 2200  . MAGNESIUM GLYCINATE PO Take 200 mg by mouth at bedtime as needed.   Past Week at Unknown time  . Meclizine HCl (BONINE PO) Take by mouth as needed.   Past Month at Unknown time  . metroNIDAZOLE (METROCREAM) 0.75 % cream Apply 1 application topically 2 (two) times daily.   5 Past Month at Unknown time  . pantoprazole (PROTONIX) 40 MG tablet Take 40 mg by mouth 2 (two) times daily.   12/16/2019 at 1700  . triamcinolone cream (KENALOG) 0.1 % Apply 1 application topically 2 (two) times daily. 30 g 0 Past Month at Unknown time  . ALPRAZolam (XANAX) 0.25 MG tablet Take 1 tablet (0.25 mg total) by mouth at bedtime as needed for anxiety. (Patient not taking: Reported on 12/17/2019) 30 tablet 3 Not Taking at Unknown time  . omeprazole (PRILOSEC) 40 MG capsule Take 1 capsule (40 mg  total) by mouth 2 (two) times daily at 10 AM and 5 PM. (Patient not taking: Reported on 12/15/2019) 180 capsule 1 Not Taking at Unknown time  . potassium chloride SA (KLOR-CON) 20 MEQ tablet Take 1 tablet (20 mEq total) by mouth daily. (Patient not taking: Reported on 12/17/2019) 30 tablet 3 Completed Course at Unknown time   Allergies  Allergen Reactions  . Barium Iodide     Other reaction(s): Unknown  . Crestor [Rosuvastatin Calcium] Other (See Comments)    Myalgia   . Penicillins Rash  . Sulfa Antibiotics Rash  . Sulfonamide Derivatives Rash   Past Medical History:  Diagnosis Date  . Anxiety   . Arrhythmia    paroxysmal atrial fibrillation  . CHF (congestive heart failure) ([place])   . Colon polyp   . Diverticulitis   . Dysrhythmia   . GERD (gastroesophageal reflux disease)   . Hard of hearing   . Hoarseness   . Hyperlipidemia   . Hypertension   . Kidney stone   . Motion sickness   . Parathyroid disease ([place])    Parathyroidectomy   . PONV (postoperative nausea and vomiting)   . Pre-diabetes   . PVC (premature ventricular contraction)   . Skin cancer   . Stroke ([place])   .  TIA (transient ischemic attack) 04/2018   no deficitis  . UTI (lower urinary tract infection)   . Varicose veins of both lower extremities   . Wears hearing aid in both ears    Past Surgical History:  Procedure Laterality Date  . ABDOMINAL HYSTERECTOMY    . APPENDECTOMY    . CATARACT EXTRACTION W/PHACO Left 03/05/2019   Procedure: CATARACT EXTRACTION PHACO AND INTRAOCULAR LENS PLACEMENT (IOC)   0:57 15.6% 9.05;  Surgeon: Leandrew Koyanagi, MD;  Location: [place];  Service: Ophthalmology;  Laterality: Left;  Diabetic - diet cotrolled  . CATARACT EXTRACTION W/PHACO Right 04/09/2019   Procedure: CATARACT EXTRACTION PHACO AND INTRAOCULAR LENS PLACEMENT (IOC) RIGHT DIABETIC 00:47.6  15.9%  7.60;  Surgeon: Leandrew Koyanagi, MD;  Location: [place];  Service: Ophthalmology;   Laterality: Right;  . CHOLECYSTECTOMY  2002  . LITHOTRIPSY    . PARATHYROIDECTOMY  2004   1 removed  . ROTATOR CUFF REPAIR  2002  . TONSILLECTOMY  1956  . TONSILLECTOMY    . TOTAL ABDOMINAL HYSTERECTOMY W/ BILATERAL SALPINGOOPHORECTOMY  1984  . VEIN LIGATION AND STRIPPING     Social History   Socioeconomic History  . Marital status: Widowed    Spouse name: [name]  . Number of children: 2  . Years of education: 48  . Highest education level: Not on file  Occupational History  . Occupation: Product/process development scientist: RETIRED    Comment: Retired  Tobacco Use  . Smoking status: Never Smoker  . Smokeless tobacco: Never Used  Vaping Use  . Vaping Use: Never used  Substance and Sexual Activity  . Alcohol use: No  . Drug use: No  . Sexual activity: Never  Other Topics Concern  . Not on file  Social History Narrative   Linda [name] is from [facility] and [place] farm. Recently widowed. She was married to DeBary, her husband for 59 years. They have a daughter and a son. She enjoys gardening.   Social Determinants of Health   Financial Resource Strain:   . Difficulty of Paying Living Expenses:   Food Insecurity:   . Worried About Charity fundraiser in the Last Year:   . Arboriculturist in the Last Year:   Transportation Needs:   . Film/video editor (Medical):   Marland Kitchen Lack of Transportation (Non-Medical):   Physical Activity:   . Days of Exercise per Week:   . Minutes of Exercise per Session:   Stress:   . Feeling of Stress :   Social Connections:   . Frequency of Communication with Friends and Family:   . Frequency of Social Gatherings with Friends and Family:   . Attends Religious Services:   . Active Member of Clubs or Organizations:   . Attends Archivist Meetings:   Marland Kitchen Marital Status:   Intimate Partner Violence:   . Fear of Current or Ex-Partner:   . Emotionally Abused:   Marland Kitchen Physically Abused:   . Sexually Abused:    Social History   Social History  Narrative   Linda [name] is from [facility] and grew up on a farm. Recently widowed. She was married to Seagraves, her husband for 59 years. They have a daughter and a son. She enjoys gardening.     ROS: Negative.     PE: HEENT: Negative. Lungs: Clear. Cardio: [place] (HX atrial fibrillation).   Assessment/Plan:  Proceed with planned upper endoscopy.    Robert Bellow  12/17/2019    

## 2019-12-17 NOTE — Anesthesia Preprocedure Evaluation (Signed)
Anesthesia Evaluation  Patient identified by MRN, date of birth, ID band Patient awake    Reviewed: Allergy & Precautions, H&P , NPO status , Patient's Chart, lab work & pertinent test results, reviewed documented beta blocker date and time   History of Anesthesia Complications (+) PONV and history of anesthetic complications  Airway Mallampati: I  TM Distance: >3 FB Neck ROM: full    Dental  (+) Dental Advidsory Given, Caps, Teeth Intact   Pulmonary neg pulmonary ROS,    Pulmonary exam normal breath sounds clear to auscultation       Cardiovascular Exercise Tolerance: Good hypertension, (-) angina+ Peripheral Vascular Disease and +CHF  (-) Past MI and (-) Cardiac Stents + dysrhythmias Atrial Fibrillation (-) Valvular Problems/Murmurs Rhythm:regular Rate:Normal     Neuro/Psych neg Seizures PSYCHIATRIC DISORDERS Anxiety TIA   GI/Hepatic Neg liver ROS, GERD  ,  Endo/Other  diabetes, Well Controlled  Renal/GU Renal disease (kidney stone)  negative genitourinary   Musculoskeletal   Abdominal   Peds  Hematology negative hematology ROS (+)   Anesthesia Other Findings Past Medical History: No date: Anxiety No date: Arrhythmia     Comment:  paroxysmal atrial fibrillation No date: CHF (congestive heart failure) (H[facility]) No date: Colon polyp No date: Diverticulitis No date: Dysrhythmia No date: GERD (gastroesophageal reflux disease) No date: Hard of hearing No date: Hoarseness No date: Hyperlipidemia No date: Hypertension No date: Kidney stone No date: Motion sickness No date: Parathyroid disease ([place])     Comment:  Parathyroidectomy  No date: PONV (postoperative nausea and vomiting) No date: Pre-diabetes No date: PVC (premature ventricular contraction) No date: Skin cancer No date: Stroke ([place]) 04/2018: TIA (transient ischemic attack)     Comment:  no deficitis No date: UTI (lower urinary tract infection) No  date: Varicose veins of both lower extremities No date: Wears hearing aid in both ears   Reproductive/Obstetrics negative OB ROS                             Anesthesia Physical Anesthesia Plan  ASA: III  Anesthesia Plan: General   Post-op Pain Management:    Induction: Intravenous  PONV Risk Score and Plan: 4 or greater and Propofol infusion and TIVA  Airway Management Planned: Natural Airway and Nasal Cannula  Additional Equipment:   Intra-op Plan:   Post-operative Plan:   Informed Consent: I have reviewed the patients History and Physical, chart, labs and discussed the procedure including the risks, benefits and alternatives for the proposed anesthesia with the patient or authorized representative who has indicated his/her understanding and acceptance.     Dental Advisory Given  Plan Discussed with: Anesthesiologist, CRNA and Surgeon  Anesthesia Plan Comments:         Anesthesia Quick Evaluation

## 2019-12-17 NOTE — Transfer of Care (Signed)
Immediate Anesthesia Transfer of Care Note  Patient: Linda [name]  Procedure(s) Performed: ESOPHAGOGASTRODUODENOSCOPY (EGD) WITH PROPOFOL (N/A )  Patient Location: PACU  Anesthesia Type:General  Level of Consciousness: drowsy  Airway & Oxygen Therapy: Patient Spontanous Breathing and Patient connected to nasal cannula oxygen  Post-op Assessment: Report given to RN and Post -op Vital signs reviewed and stable  Post vital signs: Reviewed and stable  Last Vitals:  Vitals Value Taken Time  BP 135/74 12/17/19 0938  Temp 36.3 C 12/17/19 0938  Pulse 58 12/17/19 0939  Resp 24 12/17/19 0939  SpO2 95 % 12/17/19 0939  Vitals shown include unvalidated device data.  Last Pain:  Vitals:   12/17/19 0845  TempSrc: Temporal  PainSc: 0-No pain         Complications: No complications documented.

## 2019-12-18 ENCOUNTER — Encounter: Payer: Self-pay | Admitting: General Surgery

## 2019-12-18 LAB — SURGICAL PATHOLOGY

## 2019-12-19 NOTE — Anesthesia Postprocedure Evaluation (Signed)
Anesthesia Post Note  Patient: Linda [name]  Procedure(s) Performed: ESOPHAGOGASTRODUODENOSCOPY (EGD) WITH PROPOFOL (N/A )  Patient location during evaluation: Endoscopy Anesthesia Type: General Level of consciousness: awake and alert Pain management: pain level controlled Vital Signs Assessment: post-procedure vital signs reviewed and stable Respiratory status: spontaneous breathing, nonlabored ventilation, respiratory function stable and patient connected to nasal cannula oxygen Cardiovascular status: blood pressure returned to baseline and stable Postop Assessment: no apparent nausea or vomiting Anesthetic complications: no   No complications documented.   Last Vitals:  Vitals:   12/17/19 0958 12/17/19 1008  BP: 136/85 (!) 146/77  Pulse: (!) 44   Resp: (!) 23   Temp:    SpO2: 95%     Last Pain:  Vitals:   12/17/19 1008  TempSrc:   PainSc: 0-No pain                 Martha Clan

## 2019-12-30 NOTE — Telephone Encounter (Signed)
Pt would like a call back about MyChart message

## 2020-02-03 ENCOUNTER — Telehealth: Payer: Self-pay

## 2020-02-03 ENCOUNTER — Ambulatory Visit
Admission: EM | Admit: 2020-02-03 | Discharge: 2020-02-03 | Disposition: A | Discharge status: Home / Self Care | Payer: Medicare Other | Attending: Family Medicine | Admitting: Family Medicine

## 2020-02-03 ENCOUNTER — Other Ambulatory Visit: Payer: Self-pay

## 2020-02-03 DIAGNOSIS — R5383 Other fatigue: Secondary | ICD-10-CM

## 2020-02-03 DIAGNOSIS — R062 Wheezing: Secondary | ICD-10-CM | POA: Diagnosis not present

## 2020-02-03 DIAGNOSIS — Z1152 Encounter for screening for COVID-19: Secondary | ICD-10-CM | POA: Diagnosis not present

## 2020-02-03 DIAGNOSIS — R059 Cough, unspecified: Secondary | ICD-10-CM

## 2020-02-03 DIAGNOSIS — J069 Acute upper respiratory infection, unspecified: Secondary | ICD-10-CM | POA: Diagnosis not present

## 2020-02-03 DIAGNOSIS — J029 Acute pharyngitis, unspecified: Secondary | ICD-10-CM

## 2020-02-03 DIAGNOSIS — R05 Cough: Secondary | ICD-10-CM

## 2020-02-03 MED ORDER — DOXYCYCLINE HYCLATE 100 MG PO CAPS: 100.0000 mg | ORAL_CAPSULE | Freq: Two times a day (BID) | ORAL | 0 refills | Status: DC

## 2020-02-03 MED ORDER — PREDNISONE 10 MG (21) PO TBPK: ORAL_TABLET | Freq: Every day | ORAL | 0 refills | Status: AC

## 2020-02-03 NOTE — Telephone Encounter (Signed)
Patient was called but phone line has been busy. She was recommended to go to UC due to no availability.  Caller states that she has a productive cough of light yellow secretions. Fever is 98.8 orally. Very weak. Can taste and smell. No HA       [facility] TELEPHONE ADVICE RECORD AccessNurse Patient Name: Linda [name] Gender: Female DOB: 1933-12-06 Age: 86 Y 2 M 24 D Return Phone Number: [phone] (Primary) Address: City/State/Zip: [place] [address] Client [facility] Client Site [facility] - Day Physician Deborra Medina - MD Contact Type Call Who Is Calling Patient / Member / Family / Caregiver Call Type Triage / Clinical Relationship To Patient Self Return Phone Number [phone] (Primary) Chief Complaint Weakness, Generalized Reason for Call Symptomatic / Request for [place] states, pt having cough and weakness. Covid test questions. Translation No Nurse Assessment Nurse: Laurena Bering, RN, Helene Kelp Date/Time Eilene Ghazi Time): 02/03/2020 8:09:13 AM Confirm and document reason for call. If symptomatic, describe symptoms. ---Caller states that she has a productive cough of light yellow secretions. Fever is 98.8 orally. Very weak. Can taste and smell. No HA Has the patient had close contact with a person known or suspected to have the novel coronavirus illness OR traveled / lives in area with major community spread (including international travel) in the last 14 days from the onset of symptoms? * If Asymptomatic, screen for exposure and travel within the last 14 days. ---No Does the patient have any new or worsening symptoms? ---Yes Will a triage be completed? ---Yes Related visit to physician within the last 2 weeks? ---No Does the PT have any chronic conditions? (i.e. diabetes, asthma, this includes High risk factors for  pregnancy, etc.) ---Yes List chronic conditions. ---blood thinner, see chart Is this a behavioral health or substance abuse call? ---No Guidelines Guideline Title Affirmed Question Affirmed Notes Nurse Date/Time (Eastern Time) Cough - Acute Productive [1] Continuous (nonstop) coughing interferes with work or school AND [2] no improvement using cough treatment per Care Advice Laurena Bering, RN, Helene Kelp 02/03/2020 8:11:34 AM PLEASE NOTE: All timestamps contained within this report are represented as [place] Standard Time. CONFIDENTIALTY NOTICE: This fax transmission is intended only for the addressee. It contains information that is legally privileged, confidential or otherwise protected from use or disclosure. If you are not the intended recipient, you are strictly prohibited from reviewing, disclosing, copying using or disseminating any of this information or taking any action in reliance on or regarding this information. If you have received this fax in error, please notify us immediately by telephone so that we can arrange for its return to [place]. Phone: [phone], Toll-Free: [phone], Fax: [phone] Page: 2 of 2 Call Id: [id number] [place]. Time Eilene Ghazi Time) Disposition Final User 02/03/2020 8:13:24 AM See PCP within 24 Hours Yes Laurena Bering, RN, [place] Artist Disagree/Comply Comply Caller Understands Yes PreDisposition Call Doctor Care Advice Given Per Guideline SEE PCP WITHIN 24 HOURS: HUMIDIFIER: * If the air is dry, use a humidifier in the bedroom. COUGHING SPELLS: * Drink warm fluids. Inhale warm mist. (Reason: both relax the airway and loosen up the phlegm) CALL BACK IF: * Difficulty breathing occurs * You become worse. CARE ADVICE given per Cough - Acute Productive (Adult) guideline. Comments User: Dorothyann Peng, RN Date/Time Eilene Ghazi Time): 02/03/2020 8:18:36 AM No available appointments . Referred to urgent care. Referrals Warm transfer to backline

## 2020-02-03 NOTE — Discharge Instructions (Addendum)
I have sent in doxycycline for you to take twice a day for 7 days  I have sent in a prednisone taper for you to take for 6 days. 6 tablets on day one, 5 tablets on day two, 4 tablets on day three, 3 tablets on day four, 2 tablets on day five, and 1 tablet on day six.  Your COVID test is pending.  You should self quarantine until the test result is back.    Take Tylenol as needed for fever or discomfort.  Rest and keep yourself hydrated.    Go to the emergency department if you develop acute worsening symptoms.

## 2020-02-03 NOTE — ED Provider Notes (Addendum)
[place]   [id number] 02/03/20 Arrival Time: 2620   CC: COVID symptoms  SUBJECTIVE: History from: patient.  Linda [name] is a 86 y.o. female who presents with abrupt onset of nasal congestion, PND, and productive cough for the last 4 days. Denies sick exposure to COVID, flu or strep. Denies recent travel. Has negative history of Covid. Has completed Covid vaccines. Has not taken OTC medications for this. There are no aggravating or alleviating factors. Denies previous symptoms in the past. Denies fever, chills, fatigue, sinus pain, rhinorrhea, sore throat, SOB,  chest pain, nausea, changes in bowel or bladder habits.    ROS: As per HPI.  All other pertinent ROS negative.     Past Medical History:  Diagnosis Date  . Anxiety   . Arrhythmia    paroxysmal atrial fibrillation  . CHF (congestive heart failure) ([place])   . Colon polyp   . Diverticulitis   . Dysrhythmia   . GERD (gastroesophageal reflux disease)   . Hard of hearing   . Hoarseness   . Hyperlipidemia   . Hypertension   . Kidney stone   . Motion sickness   . Parathyroid disease ([place])    Parathyroidectomy   . PONV (postoperative nausea and vomiting)   . Pre-diabetes   . PVC (premature ventricular contraction)   . Skin cancer   . Stroke ([place])   . TIA (transient ischemic attack) 04/2018   no deficitis  . UTI (lower urinary tract infection)   . Varicose veins of both lower extremities   . Wears hearing aid in both ears    Past Surgical History:  Procedure Laterality Date  . ABDOMINAL HYSTERECTOMY    . APPENDECTOMY    . CATARACT EXTRACTION W/PHACO Left 03/05/2019   Procedure: CATARACT EXTRACTION PHACO AND INTRAOCULAR LENS PLACEMENT (IOC)   0:57 15.6% 9.05;  Surgeon: Leandrew Koyanagi, MD;  Location: [facility];  Service: Ophthalmology;  Laterality: Left;  Diabetic - diet cotrolled  . CATARACT EXTRACTION W/PHACO Right 04/09/2019   Procedure: CATARACT EXTRACTION PHACO AND INTRAOCULAR LENS  PLACEMENT (IOC) RIGHT DIABETIC 00:47.6  15.9%  7.60;  Surgeon: Leandrew Koyanagi, MD;  Location: [facility];  Service: Ophthalmology;  Laterality: Right;  . CHOLECYSTECTOMY  2002  . ESOPHAGOGASTRODUODENOSCOPY (EGD) WITH PROPOFOL N/A 12/17/2019   Procedure: ESOPHAGOGASTRODUODENOSCOPY (EGD) WITH PROPOFOL;  Surgeon: Robert Bellow, MD;  Location: [facility];  Service: Endoscopy;  Laterality: N/A;  . LITHOTRIPSY    . PARATHYROIDECTOMY  2004   1 removed  . ROTATOR CUFF REPAIR  2002  . TONSILLECTOMY  1956  . TONSILLECTOMY    . TOTAL ABDOMINAL HYSTERECTOMY W/ BILATERAL SALPINGOOPHORECTOMY  1984  . VEIN LIGATION AND STRIPPING     Allergies  Allergen Reactions  . Barium Iodide     Other reaction(s): Unknown  . Crestor [Rosuvastatin Calcium] Other (See Comments)    Myalgia   . Penicillins Rash  . Sulfa Antibiotics Rash  . Sulfonamide Derivatives Rash   No current facility-administered medications on file prior to encounter.   Current Outpatient Medications on File Prior to Encounter  Medication Sig Dispense Refill  . ALPRAZolam (XANAX) 0.25 MG tablet Take 1 tablet (0.25 mg total) by mouth at bedtime as needed for anxiety. (Patient not taking: Reported on 12/17/2019) 30 tablet 3  . cetirizine (ZYRTEC) 10 MG tablet Take 10 mg by mouth daily as needed for allergies.    . chlorthalidone (HYGROTON) 25 MG tablet Take 25 mg by mouth daily.     Marland Kitchen  Cholecalciferol (VITAMIN D) 2000 units tablet Take 2,000 Units by mouth daily.    Marland Kitchen ELIQUIS 5 MG TABS tablet Take 1 tablet (5 mg total) by mouth 2 (two) times daily. 60 tablet 0  . estradiol (ESTRACE) 0.1 MG/GM vaginal cream Place 1 g vaginally 3 (three) times a week. Pea sized amount per urethra 42.5 g 12  . feeding supplement (BOOST HIGH PROTEIN) LIQD Take 1 Container by mouth daily.    Marland Kitchen LORazepam (ATIVAN) 0.5 MG tablet Take 1 tablet (0.5 mg total) by mouth at bedtime as needed for anxiety. 30 tablet 0  . losartan (COZAAR) 25 MG tablet  Take 1 tablet (25 mg total) by mouth daily. 90 tablet 1  . MAGNESIUM GLYCINATE PO Take 200 mg by mouth at bedtime as needed.    . Meclizine HCl (BONINE PO) Take by mouth as needed.    . metroNIDAZOLE (METROCREAM) 0.75 % cream Apply 1 application topically 2 (two) times daily.   5  . omeprazole (PRILOSEC) 40 MG capsule Take 1 capsule (40 mg total) by mouth 2 (two) times daily at 10 AM and 5 PM. (Patient not taking: Reported on 12/15/2019) 180 capsule 1  . pantoprazole (PROTONIX) 40 MG tablet Take 40 mg by mouth 2 (two) times daily.    . potassium chloride SA (KLOR-CON) 20 MEQ tablet Take 1 tablet (20 mEq total) by mouth daily. (Patient not taking: Reported on 12/17/2019) 30 tablet 3  . triamcinolone cream (KENALOG) 0.1 % Apply 1 application topically 2 (two) times daily. 30 g 0   Social History   Socioeconomic History  . Marital status: Widowed    Spouse name: [name]  . Number of children: 2  . Years of education: 28  . Highest education level: Not on file  Occupational History  . Occupation: Product/process development scientist: RETIRED    Comment: Retired  Tobacco Use  . Smoking status: Never Smoker  . Smokeless tobacco: Never Used  Vaping Use  . Vaping Use: Never used  Substance and Sexual Activity  . Alcohol use: No  . Drug use: No  . Sexual activity: Not Currently    Birth control/protection: None  Other Topics Concern  . Not on file  Social History Narrative   [place]leen is from [facility] and [place] farm. Recently widowed. She was married to [place], her husband for 59 years. They have a daughter and a son. She enjoys gardening.   Social Determinants of Health   Financial Resource Strain:   . Difficulty of Paying Living Expenses:   Food Insecurity:   . Worried About Charity fundraiser in the Last Year:   . Arboriculturist in the Last Year:   Transportation Needs:   . Film/video editor (Medical):   Marland Kitchen Lack of Transportation (Non-Medical):   Physical Activity:   . Days of  Exercise per Week:   . Minutes of Exercise per Session:   Stress:   . Feeling of Stress :   Social Connections:   . Frequency of Communication with Friends and Family:   . Frequency of Social Gatherings with Friends and Family:   . Attends Religious Services:   . Active Member of Clubs or Organizations:   . Attends Archivist Meetings:   Marland Kitchen Marital Status:   Intimate Partner Violence:   . Fear of Current or Ex-Partner:   . Emotionally Abused:   Marland Kitchen Physically Abused:   . Sexually Abused:  Family History  Problem Relation Age of Onset  . Heart disease Brother   . Stroke Mother   . Skin cancer Father   . Breast cancer Paternal Grandmother     OBJECTIVE:  Vitals:   02/03/20 1442  BP: (!) 167/84  Pulse: (S) (!) 47  Resp: 18  Temp: 98.7 F (37.1 C)  TempSrc: Oral  SpO2: 93%     General appearance: alert; appears fatigued, but nontoxic; speaking in full sentences and tolerating own secretions HEENT: NCAT; Ears: EACs clear, TMs pearly gray; Eyes: PERRL.  EOM grossly intact. Sinuses: nontender; Nose: nares patent without rhinorrhea, Throat: oropharynx clear, tonsils non erythematous or enlarged, uvula midline  Neck: supple without LAD Lungs: unlabored respirations, symmetrical air entry; cough: moderate with yellow/brown opaque sputum; no respiratory distress; diffuse wheezing noted throughout lung fields Heart: irregular rate and rhythm. Known A fib Radial pulses 2+ symmetrical bilaterally Skin: warm and dry Psychological: alert and cooperative; normal mood and affect  LABS:  No results found for this or any previous visit (from the past 24 hour(s)).   ASSESSMENT & PLAN:  1. Upper respiratory tract infection, unspecified type   2. Cough   3. Encounter for screening for COVID-19   4. Wheezing   5. Other fatigue   6. Sore throat     Meds ordered this encounter  Medications  . predniSONE (STERAPRED UNI-PAK 21 TAB) 10 MG (21) TBPK tablet    Sig: Take by  mouth daily for 6 days. Take 6 tablets on day 1, 5 tablets on day 2, 4 tablets on day 3, 3 tablets on day 4, 2 tablets on day 5, 1 tablet on day 6    Dispense:  21 tablet    Refill:  0    Order Specific Question:   Supervising Provider    Answer:   Chase Picket [id number]  . doxycycline (VIBRAMYCIN) 100 MG capsule    Sig: Take 1 capsule (100 mg total) by mouth 2 (two) times daily.    Dispense:  14 capsule    Refill:  0    Order Specific Question:   Supervising Provider    Answer:   Chase Picket [id number]    Prescribed doxycycline  Prescribed prednisone taper URI vs possible pneumonia COVID testing ordered.  It will take between 1-2 days for test results.  Someone will contact you regarding abnormal results.    Patient should remain in quarantine until they have received Covid results.  If negative you may resume normal activities (go back to work/school) while practicing hand hygiene, social distance, and mask wearing.  If positive, patient should remain in quarantine for 10 days from symptom onset AND greater than 72 hours after symptoms resolution (absence of fever without the use of fever-reducing medication and improvement in respiratory symptoms), whichever is longer Get plenty of rest and push fluids Use OTC zyrtec for nasal congestion, runny nose, and/or sore throat Use OTC flonase for nasal congestion and runny nose Use medications daily for symptom relief Use OTC medications like ibuprofen or tylenol as needed fever or pain Call or go to the ED if you have any new or worsening symptoms such as fever, worsening cough, shortness of breath, chest tightness, chest pain, turning blue, changes in mental status.  Reviewed expectations re: course of current medical issues. Questions answered. Outlined signs and symptoms indicating need for more acute intervention. Patient verbalized understanding. After Visit Summary given.         Faustino Congress,  NP 02/03/20  1522    Faustino Congress, NP 02/03/20 1523

## 2020-02-04 LAB — SARS-COV-2, NAA 2 DAY TAT

## 2020-02-04 LAB — NOVEL CORONAVIRUS, NAA: SARS-CoV-2, NAA: NOT DETECTED

## 2020-04-06 ENCOUNTER — Telehealth: Payer: Self-pay | Admitting: Internal Medicine

## 2020-04-06 NOTE — Telephone Encounter (Signed)
Left message for patient to call back and schedule Medicare Annual Wellness Visit (AWV)   This should be a telephone visit only=30 minutes.  Last AWV 03/22/18; please schedule at anytime with Denisa O'Brien-Blaney at [facility].

## 2020-04-09 ENCOUNTER — Other Ambulatory Visit: Payer: Self-pay | Admitting: Internal Medicine

## 2020-04-09 DIAGNOSIS — Z1231 Encounter for screening mammogram for malignant neoplasm of breast: Secondary | ICD-10-CM

## 2020-04-15 ENCOUNTER — Ambulatory Visit (INDEPENDENT_AMBULATORY_CARE_PROVIDER_SITE_OTHER): Payer: Medicare Other

## 2020-04-15 VITALS — Ht 63.5 in | Wt 179.0 lb

## 2020-04-15 DIAGNOSIS — Z0000 Encounter for general adult medical examination without abnormal findings: Secondary | ICD-10-CM | POA: Diagnosis not present

## 2020-04-15 NOTE — Patient Instructions (Addendum)
°  Linda [name] , Thank you for taking time to come for your Medicare Wellness Visit. I appreciate your ongoing commitment to your health goals. Please review the following plan we discussed and let me know if I can assist you in the future.   These are the goals we discussed: Goals      Patient Stated     Low Carb Diet (pt-stated)      Healthy diet       This is a list of the screening recommended for you and due dates:  Health Maintenance  Topic Date Due   Flu Shot  01/18/2020   Mammogram  05/13/2020   Hemoglobin A1C  06/15/2020   Eye exam for diabetics  11/03/2020   Complete foot exam   12/14/2020   Tetanus Vaccine  03/27/2022   DEXA scan (bone density measurement)  Completed   COVID-19 Vaccine  Completed   Pneumonia vaccines  Completed

## 2020-04-15 NOTE — Progress Notes (Addendum)
Subjective:   Linda [name] is a 86 y.o. female who presents for Medicare Annual (Subsequent) preventive examination.  Review of Systems    No ROS.  Medicare Wellness Virtual Visit.   Cardiac Risk Factors include: advanced age (>34men, >51 women)     Objective:    Today's Vitals   04/15/20 0908  Weight: 179 lb (81.2 kg)  Height: 5' 3.5" (1.613 m)   Body mass index is 31.21 kg/m.  Advanced Directives 04/15/2020 12/17/2019 08/07/2019 04/09/2019 03/05/2019 05/10/2018 03/22/2018  Does Patient Have a Medical Advance Directive? Yes Yes No Yes Yes Yes Yes  Type of Paramedic of [place];Living will [place];Living will - [place];Living will [place];Living will [place];Living will [place];Living will  Does patient want to make changes to medical advance directive? No - Patient declined - - - No - Guardian declined No - Patient declined No - Patient declined  Copy of [place] in Chart? Yes - validated most recent copy scanned in chart (See row information) Yes - validated most recent copy scanned in chart (See row information) - Yes - validated most recent copy scanned in chart (See row information) No - copy requested No - copy requested Yes  Would patient like information on creating a medical advance directive? - - No - Patient declined - - - -    Current Medications (verified) Outpatient Encounter Medications as of 04/15/2020  Medication Sig   ALPRAZolam (XANAX) 0.25 MG tablet Take 1 tablet (0.25 mg total) by mouth at bedtime as needed for anxiety. (Patient not taking: Reported on 12/17/2019)   cetirizine (ZYRTEC) 10 MG tablet Take 10 mg by mouth daily as needed for allergies.   chlorthalidone (HYGROTON) 25 MG tablet Take 25 mg by mouth daily.    Cholecalciferol (VITAMIN D) 2000 units tablet Take 2,000 Units by mouth daily.   doxycycline  (VIBRAMYCIN) 100 MG capsule Take 1 capsule (100 mg total) by mouth 2 (two) times daily.   ELIQUIS 5 MG TABS tablet Take 1 tablet (5 mg total) by mouth 2 (two) times daily.   estradiol (ESTRACE) 0.1 MG/GM vaginal cream Place 1 g vaginally 3 (three) times a week. Pea sized amount per urethra   feeding supplement (BOOST HIGH PROTEIN) LIQD Take 1 Container by mouth daily.   LORazepam (ATIVAN) 0.5 MG tablet Take 1 tablet (0.5 mg total) by mouth at bedtime as needed for anxiety.   losartan (COZAAR) 25 MG tablet Take 1 tablet (25 mg total) by mouth daily.   MAGNESIUM GLYCINATE PO Take 200 mg by mouth at bedtime as needed.   Meclizine HCl (BONINE PO) Take by mouth as needed.   metroNIDAZOLE (METROCREAM) 0.75 % cream Apply 1 application topically 2 (two) times daily.    omeprazole (PRILOSEC) 40 MG capsule Take 1 capsule (40 mg total) by mouth 2 (two) times daily at 10 AM and 5 PM. (Patient not taking: Reported on 12/15/2019)   pantoprazole (PROTONIX) 40 MG tablet Take 40 mg by mouth 2 (two) times daily.   potassium chloride SA (KLOR-CON) 20 MEQ tablet Take 1 tablet (20 mEq total) by mouth daily. (Patient not taking: Reported on 12/17/2019)   triamcinolone cream (KENALOG) 0.1 % Apply 1 application topically 2 (two) times daily.   No facility-administered encounter medications on file as of 04/15/2020.    Allergies (verified) Barium iodide, Crestor [rosuvastatin calcium], Penicillins, Sulfa antibiotics, and Sulfonamide derivatives  History: Past Medical History:  Diagnosis Date   Anxiety    Arrhythmia    paroxysmal atrial fibrillation   CHF (congestive heart failure) (HCC)    Colon polyp    Diverticulitis    Dysrhythmia    GERD (gastroesophageal reflux disease)    Hard of hearing    Hoarseness    Hyperlipidemia    Hypertension    Kidney stone    Motion sickness    Parathyroid disease (HCC)    Parathyroidectomy    PONV (postoperative nausea and vomiting)    Pre-diabetes    PVC (premature  ventricular contraction)    Skin cancer    Stroke ([place])    TIA (transient ischemic attack) 04/2018   no deficitis   UTI (lower urinary tract infection)    Varicose veins of both lower extremities    Wears hearing aid in both ears    Past Surgical History:  Procedure Laterality Date   ABDOMINAL HYSTERECTOMY     APPENDECTOMY     CATARACT EXTRACTION W/PHACO Left 03/05/2019   Procedure: CATARACT EXTRACTION PHACO AND INTRAOCULAR LENS PLACEMENT ([place])   0:57 15.6% 9.05;  Surgeon: Leandrew Koyanagi, MD;  Location: [place];  Service: Ophthalmology;  Laterality: Left;  Diabetic - diet cotrolled   CATARACT EXTRACTION W/PHACO Right 04/09/2019   Procedure: CATARACT EXTRACTION PHACO AND INTRAOCULAR LENS PLACEMENT ([facility]) RIGHT DIABETIC 00:47.6  15.9%  7.60;  Surgeon: Leandrew Koyanagi, MD;  Location: [place];  Service: Ophthalmology;  Laterality: Right;   CHOLECYSTECTOMY  2002   ESOPHAGOGASTRODUODENOSCOPY (EGD) WITH PROPOFOL N/A 12/17/2019   Procedure: ESOPHAGOGASTRODUODENOSCOPY (EGD) WITH PROPOFOL;  Surgeon: Robert Bellow, MD;  Location: [facility];  Service: Endoscopy;  Laterality: N/A;   LITHOTRIPSY     PARATHYROIDECTOMY  2004   1 removed   ROTATOR CUFF REPAIR  2002   TONSILLECTOMY  1956   TONSILLECTOMY     TOTAL ABDOMINAL HYSTERECTOMY W/ BILATERAL SALPINGOOPHORECTOMY  1984   VEIN LIGATION AND STRIPPING     Family History  Problem Relation Age of Onset   Heart disease Brother    Stroke Mother    Skin cancer Father    Breast cancer Paternal Grandmother    Social History   Socioeconomic History   Marital status: Widowed    Spouse name: Linda [name]   Number of children: 2   Years of education: 12   Highest education level: Not on file  Occupational History   Occupation: Product/process development scientist: RETIRED    Comment: Retired  Tobacco Use   Smoking status: Never Smoker   Smokeless tobacco: Never Used  Scientific laboratory technician Use: Never used  Substance and  Sexual Activity   Alcohol use: No   Drug use: No   Sexual activity: Not Currently    Birth control/protection: None  Other Topics Concern   Not on file  Social History Narrative   Linda [name] is from [facility] [place] [facility] and [place]. Recently widowed. She was married to Linda [name], her husband for 59 years. They have a daughter and a son. She enjoys gardening.   Social Determinants of Health   Financial Resource Strain: Low Risk    Difficulty of Paying Living Expenses: Not hard at all  Food Insecurity: No Food Insecurity   Worried About Charity fundraiser in the Last Year: Never true   Ran Out of Food in the Last Year: Never true  Transportation Needs: No Transportation Needs   Lack of  Transportation (Medical): No   Lack of Transportation (Non-Medical): No  Physical Activity: Unknown   Days of Exercise per Week: 0 days   Minutes of Exercise per Session: Not on file  Stress: No Stress Concern Present   Feeling of Stress : Not at all  Social Connections: Unknown   Frequency of Communication with Friends and Family: More than three times a week   Frequency of Social Gatherings with Friends and Family: Once a week   Attends Religious Services: More than 4 times per year   Active Member of [employer] or Organizations: Yes   Attends Music therapist: Not on file   Marital Status: Not on file    Tobacco Counseling Counseling given: Not Answered   Clinical Intake:  Pre-visit preparation completed: Yes        Diabetes: Yes (Followed by pcp)  How often do you need to have someone help you when you read instructions, pamphlets, or other written materials from your doctor or pharmacy?: 1 - Never  Nutrition Risk Assessment: Has the patient had any N/V/D within the last 2 months?  No  Does the patient have any non-healing wounds?  No  Has the patient had any unintentional weight loss or weight gain?  No   Diabetes: If diabetic, was a CBG obtained today?  No  Did the  patient bring in their glucometer from home?  No . Virtual visit.   Financial Strains and Diabetes Management: Are you having any financial strains with the device, your supplies or your medication? No .  Does the patient want to be seen by Chronic Care Management for management of their diabetes?  No  Would the patient like to be referred to a Nutritionist or for Diabetic Management?  No   Diabetic Exams: Diabetic Eye Exam: Completed 04/01/20   Foot exam- followed by pcp. Denies wounds or changes.  Interpreter Needed?: No     Activities of Daily Living In your present state of health, do you have any difficulty performing the following activities: 04/15/2020  Hearing? Y  Vision? N  Difficulty concentrating or making decisions? N  Walking or climbing stairs? N  Dressing or bathing? N  Doing errands, shopping? N  Preparing Food and eating ? N  Using the Toilet? N  In the past six months, have you accidently leaked urine? N  Do you have problems with loss of bowel control? N  Managing your Medications? N  Managing your Finances? N  Housekeeping or managing your Housekeeping? Y  Comment Maid assist a couple of hours a week which helps with lonliness.  Some recent data might be hidden    Patient Care Team: Crecencio Mc, MD as PCP - General (Internal Medicine)  Indicate any recent Medical Services you may have received from other than [facility] providers in the past year (date may be approximate).     Assessment:   This is a routine wellness examination for Linda [name].  I connected with Rashmi today by telephone and verified that I am speaking with the correct person using two identifiers. Location patient: home Location provider: work Persons participating in the virtual visit: patient, Marine scientist.    I discussed the limitations, risks, security and privacy concerns of performing an evaluation and management service by telephone and the availability of in person appointments. The  patient expressed understanding and verbally consented to this telephonic visit.    Interactive audio and video telecommunications were attempted between this provider and patient, however failed, due  to patient having technical difficulties OR patient did not have access to video capability.  We continued and completed visit with audio only.  Some vital signs may be absent or patient reported.   Hearing/Vision screen  Hearing Screening   125Hz  250Hz  500Hz  1000Hz  2000Hz  3000Hz  4000Hz  6000Hz  8000Hz   Right ear:           Left ear:           Comments: Followed by [facility]  Visits every 4-6 months Hearing aid, bilateral  Vision Screening Comments: Followed by [facility]  Wears corrective lenses Last OV 03/2020  Dietary issues and exercise activities discussed: Current Exercise Habits: The patient does not participate in regular exercise at present (Stays active around the house.), Intensity: Mild  Healthy diet  Good water intake   Goals       Patient Stated     Low Carb Diet (pt-stated)      Healthy diet       Depression Screen PHQ 2/9 Scores 04/15/2020 12/15/2019 03/22/2018 02/26/2017 02/01/2016 01/20/2016 01/19/2015  PHQ - 2 Score 1 1 0 0 0 0 1  PHQ- 9 Score - 5 - - - - -    Fall Risk Fall Risk  04/15/2020 12/15/2019 04/02/2019 05/08/2018 03/22/2018  Falls in the past year? 0 0 0 0 No  Comment - - - Emmi Telephone Survey: data to providers prior to load -  Number falls in past yr: 0 - - - -  Injury with Fall? 0 - - - -  Follow up Falls evaluation completed Falls evaluation completed Falls evaluation completed - -   Handrails in use when climbing stairs? Yes Home free of loose throw rugs in walkways, pet beds, electrical cords, etc? Yes  Adequate lighting in your home to reduce risk of falls? Yes   ASSISTIVE DEVICES UTILIZED TO PREVENT FALLS: Life/medic alert? Yes  Use of a cane, walker or w/c? No   TIMED UP AND GO: Was the test performed? No . Virtual visit.    Cognitive Function: Patient is alert and oriented x3.  Denies difficulty focusing, making decisions, memory loss.  Enjoys jigsaw puzzles and reading for brain health.  MMSE/6CIT deferred. Normal by direct communication/observation.  MMSE - Mini Mental State Exam 02/26/2017 02/01/2016  Orientation to time 5 5  Orientation to Place 5 5  Registration 3 3  Attention/ Calculation 5 5  Recall 3 3  Language- name 2 objects 2 2  Language- repeat 1 1  Language- follow 3 step command 3 3  Language- read & follow direction 1 1  Write a sentence 1 1  Copy design 1 1  Total score 30 30     6CIT Screen 03/22/2018  What Year? 0 points  What month? 0 points  What time? 0 points  Count back from 20 0 points  Months in reverse 0 points  Repeat phrase 0 points  Total Score 0    Immunizations Immunization History  Administered Date(s) Administered   Fluad Quad(high Dose 65+) 04/04/2019   Influenza-Unspecified 02/17/2013, 03/02/2014, 03/05/2015, 03/31/2016, 04/03/2018   PFIZER SARS-COV-2 Vaccination 07/03/2019, 07/24/2019, 03/31/2020   Pneumococcal Conjugate-13 03/04/2014   Pneumococcal Polysaccharide-23 11/18/2010, 02/07/2018   Td 11/17/2009    Health Maintenance Health Maintenance  Topic Date Due   INFLUENZA VACCINE  01/18/2020   MAMMOGRAM  05/13/2020   HEMOGLOBIN A1C  06/15/2020   OPHTHALMOLOGY EXAM  11/03/2020   FOOT EXAM  12/14/2020   TETANUS/TDAP  03/27/2022  DEXA SCAN  Completed   COVID-19 Vaccine  Completed   PNA vac Low Risk Adult  Completed    Colorectal cancer screening: No longer required.   Mammogram scheduled 05/20/20   Bone Density - 08/18/14. Osteopenia. Taking Vitamin D 2000 U daily.  Dental Screening: Recommended annual dental exams for proper oral hygiene.  Community Resource Referral / Chronic Care Management: CRR required this visit?  No   CCM required this visit?  No      Plan:   Keep all routine maintenance appointments.   Follow up 06/23/20 @  8:30  Mammogram 12/2//21  I have personally reviewed and noted the following in the patient's chart:   Medical and social history Use of alcohol, tobacco or illicit drugs  Current medications and supplements Functional ability and status Nutritional status Physical activity Advanced directives List of other physicians Hospitalizations, surgeries, and ER visits in previous 12 months Vitals Screenings to include cognitive, depression, and falls Referrals and appointments  In addition, I have reviewed and discussed with patient certain preventive protocols, quality metrics, and best practice recommendations. A written personalized care plan for preventive services as well as general preventive health recommendations were provided to patient via mychart.     OBrien-Blaney, Elisavet Buehrer L, LPN   [id number]     I have reviewed the above information and agree with above.   Deborra Medina, MD

## 2020-05-25 ENCOUNTER — Telehealth: Payer: Self-pay | Admitting: Internal Medicine

## 2020-05-25 NOTE — Telephone Encounter (Signed)
Patient has been feeling very weak overall, she has had loss of appetite losing weight. She would like an appointment to check up on these things. Dr Ubaldo Glassing cardiologist stated for her to follow up with PCP since she hasn't seen pcp in while. She see cardiology again tomorrow. Lasix has helped with her chronic SOB.appointment has been scheduled.

## 2020-05-25 NOTE — Telephone Encounter (Signed)
Will you triage and make sure this is something that is not acute

## 2020-05-25 NOTE — Telephone Encounter (Signed)
Pt called she is wanting to schedule and in office appt before her schedule appt in Jan.. pt stated that she is fatigue and having on Going SOB that she has been seeing Cardiology for.  She saw cardiology  and said that she had a UTI

## 2020-05-27 ENCOUNTER — Encounter: Payer: Self-pay | Admitting: Internal Medicine

## 2020-05-27 ENCOUNTER — Other Ambulatory Visit: Payer: Self-pay

## 2020-05-27 ENCOUNTER — Ambulatory Visit (INDEPENDENT_AMBULATORY_CARE_PROVIDER_SITE_OTHER): Payer: Medicare Other | Admitting: Internal Medicine

## 2020-05-27 VITALS — BP 126/88 | HR 44 | Temp 97.4°F | Resp 15 | Ht 63.5 in | Wt 176.4 lb

## 2020-05-27 DIAGNOSIS — R5383 Other fatigue: Secondary | ICD-10-CM | POA: Diagnosis not present

## 2020-05-27 DIAGNOSIS — E876 Hypokalemia: Secondary | ICD-10-CM

## 2020-05-27 DIAGNOSIS — F419 Anxiety disorder, unspecified: Secondary | ICD-10-CM

## 2020-05-27 DIAGNOSIS — R600 Localized edema: Secondary | ICD-10-CM

## 2020-05-27 DIAGNOSIS — D751 Secondary polycythemia: Secondary | ICD-10-CM

## 2020-05-27 DIAGNOSIS — F5105 Insomnia due to other mental disorder: Secondary | ICD-10-CM

## 2020-05-27 DIAGNOSIS — E1129 Type 2 diabetes mellitus with other diabetic kidney complication: Secondary | ICD-10-CM | POA: Diagnosis not present

## 2020-05-27 DIAGNOSIS — Z23 Encounter for immunization: Secondary | ICD-10-CM | POA: Diagnosis not present

## 2020-05-27 DIAGNOSIS — E538 Deficiency of other specified B group vitamins: Secondary | ICD-10-CM | POA: Diagnosis not present

## 2020-05-27 DIAGNOSIS — R809 Proteinuria, unspecified: Secondary | ICD-10-CM | POA: Diagnosis not present

## 2020-05-27 DIAGNOSIS — R300 Dysuria: Secondary | ICD-10-CM

## 2020-05-27 LAB — CBC WITH DIFFERENTIAL/PLATELET
Basophils Absolute: 0.1 10*3/uL (ref 0.0–0.1)
Basophils Relative: 1.1 % (ref 0.0–3.0)
Eosinophils Absolute: 0.2 10*3/uL (ref 0.0–0.7)
Eosinophils Relative: 3 % (ref 0.0–5.0)
HCT: 47.1 % — ABNORMAL HIGH (ref 36.0–46.0)
Hemoglobin: 15.7 g/dL — ABNORMAL HIGH (ref 12.0–15.0)
Lymphocytes Relative: 26.9 % (ref 12.0–46.0)
Lymphs Abs: 1.5 10*3/uL (ref 0.7–4.0)
MCHC: 33.3 g/dL (ref 30.0–36.0)
MCV: 96.1 fl (ref 78.0–100.0)
Monocytes Absolute: 0.6 10*3/uL (ref 0.1–1.0)
Monocytes Relative: 11.2 % (ref 3.0–12.0)
Neutro Abs: 3.3 10*3/uL (ref 1.4–7.7)
Neutrophils Relative %: 57.8 % (ref 43.0–77.0)
Platelets: 196 10*3/uL (ref 150.0–400.0)
RBC: 4.91 Mil/uL (ref 3.87–5.11)
RDW: 14.4 % (ref 11.5–15.5)
WBC: 5.8 10*3/uL (ref 4.0–10.5)

## 2020-05-27 LAB — HEPATIC FUNCTION PANEL
ALT: 21 U/L (ref 0–35)
AST: 24 U/L (ref 0–37)
Albumin: 4.1 g/dL (ref 3.5–5.2)
Alkaline Phosphatase: 64 U/L (ref 39–117)
Bilirubin, Direct: 0.3 mg/dL (ref 0.0–0.3)
Total Bilirubin: 1.5 mg/dL — ABNORMAL HIGH (ref 0.2–1.2)
Total Protein: 6.9 g/dL (ref 6.0–8.3)

## 2020-05-27 LAB — VITAMIN B12: Vitamin B-12: 513 pg/mL (ref 211–911)

## 2020-05-27 LAB — BASIC METABOLIC PANEL
BUN: 29 mg/dL — ABNORMAL HIGH (ref 6–23)
CO2: 39 mEq/L — ABNORMAL HIGH (ref 19–32)
Calcium: 9.4 mg/dL (ref 8.4–10.5)
Chloride: 94 mEq/L — ABNORMAL LOW (ref 96–112)
Creatinine, Ser: 0.93 mg/dL (ref 0.40–1.20)
GFR: 56.43 mL/min — ABNORMAL LOW (ref >60.00)
Glucose, Bld: 90 mg/dL (ref 70–99)
Potassium: 3.3 mEq/L — ABNORMAL LOW (ref 3.5–5.1)
Sodium: 142 mEq/L (ref 135–145)

## 2020-05-27 LAB — HEMOGLOBIN A1C: Hgb A1c MFr Bld: 7.1 % — ABNORMAL HIGH (ref 4.6–6.5)

## 2020-05-27 LAB — TSH: TSH: 1.91 u[IU]/mL (ref 0.35–4.50)

## 2020-05-27 NOTE — Patient Instructions (Signed)

## 2020-05-27 NOTE — Progress Notes (Signed)
Subjective:  Patient ID: [name], female    DOB: May 11, 1934  Age: 86 y.o. MRN: [id number]  CC: The primary encounter diagnosis was Fatigue, unspecified type. Diagnoses of Controlled type 2 diabetes mellitus with microalbuminuria, without long-term current use of insulin ([place]), B12 deficiency, Dysuria, Need for immunization against influenza, Insomnia secondary to anxiety, Localized edema, Polycythemia, secondary, and Hypokalemia were also pertinent to this visit.  HPI [facility] presents for evaluation of weakness  And follow up on type 2 DM,  Aortic athoersclerosis with statin intolerance   This visit occurred during the SARS-CoV-2 public health emergency.  Safety protocols were in place, including screening questions prior to the visit, additional usage of staff PPE, and extensive cleaning of exam room while observing appropriate contact time as indicated for disinfecting solutions.   Cc: weakness for the past month.  Has seen her cardiologist.  Dr Ubaldo Glassing attributes it to leaking mitral valve.  ECHO done,  Severe MR noted.  TEE planned for fiuture.  Told she had a uTi because of abnormal UA but has had no symptoms of dysuria,  frequency or persistent nausea,  But states that she has been having brief episodes of  nausea every other day for the past month lasting only a few minutes  Relieved with a sip of of coke.   .  She is concerned she may have b12 deficiency .  She is taking protonix .  Wakes up with no energy,   Taking trazodone for insomnia .  Taking lasix daily for the past month .  Gets short of breath if she walks to the mailbox  Or bends over to tie shoes.  Uses the cart as a walker  at grocery store   potassium was 3.2 yesterday   No med changes yet,  Takes  10 meq of potassium and daily 20 mg .  One bp medication was stopped (chlorthalidone, she thinks)   Aortic arch atherosclerosis:  Reviewed findings of prior CT scan today..  Patient  Has a history of recurrent statin myalgias  with all trials . Discussed the role of statin therapy in stablizing placque and preventing events.    Outpatient Medications Prior to Visit  Medication Sig Dispense Refill  . cetirizine (ZYRTEC) 10 MG tablet Take 10 mg by mouth daily as needed for allergies.    . Cholecalciferol (VITAMIN D) 2000 units tablet Take 2,000 Units by mouth daily.    Marland Kitchen ELIQUIS 5 MG TABS tablet Take 1 tablet (5 mg total) by mouth 2 (two) times daily. 60 tablet 0  . erythromycin ophthalmic ointment 1 application at bedtime.    Marland Kitchen estradiol (ESTRACE) 0.1 MG/GM vaginal cream Place 1 g vaginally 3 (three) times a week. Pea sized amount per urethra 42.5 g 12  . furosemide (LASIX) 20 MG tablet Take 20 mg by mouth daily.    Marland Kitchen LORazepam (ATIVAN) 0.5 MG tablet Take 1 tablet (0.5 mg total) by mouth at bedtime as needed for anxiety. 30 tablet 0  . losartan (COZAAR) 25 MG tablet Take 1 tablet (25 mg total) by mouth daily. 90 tablet 1  . pantoprazole (PROTONIX) 40 MG tablet Take 40 mg by mouth 2 (two) times daily.    . potassium chloride (KLOR-CON) 10 MEQ tablet Take 10 mEq by mouth daily.    . traZODone (DESYREL) 50 MG tablet     . ALPRAZolam (XANAX) 0.25 MG tablet Take 1 tablet (0.25 mg total) by mouth at bedtime as needed for anxiety. (  Patient not taking: No sig reported) 30 tablet 3  . chlorthalidone (HYGROTON) 25 MG tablet Take 25 mg by mouth daily.  (Patient not taking: Reported on 05/27/2020)    . doxycycline (VIBRAMYCIN) 100 MG capsule Take 1 capsule (100 mg total) by mouth 2 (two) times daily. (Patient not taking: Reported on 05/27/2020) 14 capsule 0  . feeding supplement (BOOST HIGH PROTEIN) LIQD Take 1 Container by mouth daily. (Patient not taking: Reported on 05/27/2020)    . MAGNESIUM GLYCINATE PO Take 200 mg by mouth at bedtime as needed. (Patient not taking: Reported on 05/27/2020)    . Meclizine HCl (BONINE PO) Take by mouth as needed. (Patient not taking: Reported on 05/27/2020)    . metroNIDAZOLE (METROCREAM) 0.75 %  cream Apply 1 application topically 2 (two) times daily.  (Patient not taking: Reported on 05/27/2020)  5  . omeprazole (PRILOSEC) 40 MG capsule Take 1 capsule (40 mg total) by mouth 2 (two) times daily at 10 AM and 5 PM. (Patient not taking: No sig reported) 180 capsule 1  . potassium chloride SA (KLOR-CON) 20 MEQ tablet Take 1 tablet (20 mEq total) by mouth daily. (Patient not taking: No sig reported) 30 tablet 3  . triamcinolone cream (KENALOG) 0.1 % Apply 1 application topically 2 (two) times daily. (Patient not taking: Reported on 05/27/2020) 30 g 0   No facility-administered medications prior to visit.    Review of Systems;  Patient denies headache, fevers, malaise, unintentional weight loss, skin rash, eye pain, sinus congestion and sinus pain, sore throat, dysphagia,  hemoptysis , cough, dyspnea, wheezing, chest pain, palpitations, orthopnea, edema, abdominal pain, nausea, melena, diarrhea, constipation, flank pain, dysuria, hematuria, urinary  Frequency, nocturia, numbness, tingling, seizures,  Focal weakness, Loss of consciousness,  Tremor, insomnia, depression, anxiety, and suicidal ideation.      Objective:  BP 126/88 (BP Location: Left Arm, Patient Position: Sitting, Cuff Size: Normal)   Pulse (!) 44   Temp (!) 97.4 F (36.3 C) (Oral)   Resp 15   Ht 5' 3.5" (1.613 m)   Wt 176 lb 6.4 oz (80 kg)   SpO2 94%   BMI 30.76 kg/m   BP Readings from Last 3 Encounters:  05/27/20 126/88  02/03/20 (!) 167/84  12/17/19 (!) 146/77    Wt Readings from Last 3 Encounters:  05/27/20 176 lb 6.4 oz (80 kg)  04/15/20 179 lb (81.2 kg)  12/17/19 179 lb (81.2 kg)    General appearance: alert, cooperative and appears stated age Ears: normal TM's and external ear canals both ears Throat: lips, mucosa, and tongue normal; teeth and gums normal Neck: no adenopathy, no carotid bruit, supple, symmetrical, trachea midline and thyroid not enlarged, symmetric, no tenderness/mass/nodules Back:  symmetric, no curvature. ROM normal. No CVA tenderness. Lungs: clear to auscultation bilaterally Heart: regular rate and rhythm, S1, S2 normal, no murmur, click, rub or gallop Abdomen: soft, non-tender; bowel sounds normal; no masses,  no organomegaly Pulses: 2+ and symmetric Skin: Skin color, texture, turgor normal. No rashes or lesions Lymph nodes: Cervical, supraclavicular, and axillary nodes normal.  Lab Results  Component Value Date   HGBA1C 7.1 (H) 05/27/2020   HGBA1C 6.6 (H) 12/15/2019   HGBA1C 6.8 (H) 11/27/2018    Lab Results  Component Value Date   CREATININE 0.93 05/27/2020   CREATININE 0.91 12/15/2019   CREATININE 0.84 11/27/2018    Lab Results  Component Value Date   WBC 5.8 05/27/2020   HGB 15.7 (H) 05/27/2020   HCT  47.1 (H) 05/27/2020   PLT 196.0 05/27/2020   GLUCOSE 90 05/27/2020   CHOL 186 12/15/2019   TRIG 67.0 12/15/2019   HDL 63.10 12/15/2019   LDLDIRECT 90.0 10/23/2016   LDLCALC 109 (H) 12/15/2019   ALT 21 05/27/2020   AST 24 05/27/2020   NA 142 05/27/2020   K 3.3 (L) 05/27/2020   CL 94 (L) 05/27/2020   CREATININE 0.93 05/27/2020   BUN 29 (H) 05/27/2020   CO2 39 (H) 05/27/2020   TSH 1.91 05/27/2020   INR 1.78 05/11/2018   HGBA1C 7.1 (H) 05/27/2020   MICROALBUR 1.4 12/15/2019    No results found.  Assessment & Plan:   Problem List Items Addressed This Visit      Unprioritized   Edema    Secondary to venous insufficiency but using lasix daily.  May have OSA aggravating her condition      Polycythemia, secondary    Chronic, managed with surveillance currently.  Used to donate blood in the past  Etiology may be undiagnosed OSA   Lab Results  Component Value Date   WBC 5.8 05/27/2020   HGB 15.7 (H) 05/27/2020   HCT 47.1 (H) 05/27/2020   MCV 96.1 05/27/2020   PLT 196.0 05/27/2020         Insomnia secondary to anxiety    She is sleeping with the use of trazodone but wakes up exhausted.  Will recommend sleep study given  progression of her mitral valve insufficiency      Relevant Medications   traZODone (DESYREL) 50 MG tablet   Controlled type 2 diabetes mellitus with microalbuminuria, without long-term current use of insulin ([place])    Remains Well controlled on diet alone.    She  has microalbuminuria and cerebrovascular disease  She has developed early neuroapthy type symptoms on the soles of her feet. She is already taking an ARB. She is intolerant of all statins   due to recurrent myalgias  .  Aspirin is relatively contraindicated since she is already taking Eliquis .  Lab Results  Component Value Date   HGBA1C 7.1 (H) 05/27/2020   Lab Results  Component Value Date   MICROALBUR 1.4 12/15/2019         Relevant Orders   Hemoglobin A1c (Completed)   Hypokalemia    Persistent , aggravated by use of lasix.  Advised to increase potassium supplementation to 20 meQ  Daily   Lab Results  Component Value Date   NA 142 05/27/2020   K 3.3 (L) 05/27/2020   CL 94 (L) 05/27/2020   CO2 39 (H) 05/27/2020         Fatigue - Primary    Etiology not evident by serologies.  Given her morning fatigue,  Polycythemia,  Edema and MR   Need to consider OSA as a cause       Relevant Orders   Basic metabolic panel (Completed)   Hepatic function panel (Completed)   CBC with Differential/Platelet (Completed)   Iron, TIBC and Ferritin Panel   RBC Folate (Completed)   TSH (Completed)    Other Visit Diagnoses    B12 deficiency       Relevant Orders   Vitamin B12 (Completed)   Dysuria       Relevant Orders   Urinalysis, Routine w reflex microscopic   Urine Culture   Need for immunization against influenza       Relevant Orders   Flu Vaccine QUAD High Dose(Fluad) (Completed)      I  have discontinued Jillyan M. Raschke's feeding supplement, metroNIDAZOLE, triamcinolone, chlorthalidone, omeprazole, MAGNESIUM GLYCINATE PO, Meclizine HCl (BONINE PO), ALPRAZolam, potassium chloride SA, and doxycycline. I am also  having her maintain her Vitamin D, estradiol, cetirizine, LORazepam, pantoprazole, losartan, Eliquis, furosemide, potassium chloride, traZODone, and erythromycin.  No orders of the defined types were placed in this encounter.   Medications Discontinued During This Encounter  Medication Reason  . ALPRAZolam (XANAX) 0.25 MG tablet   . chlorthalidone (HYGROTON) 25 MG tablet   . doxycycline (VIBRAMYCIN) 100 MG capsule   . feeding supplement (BOOST HIGH PROTEIN) LIQD   . MAGNESIUM GLYCINATE PO   . Meclizine HCl (BONINE PO)   . metroNIDAZOLE (METROCREAM) 0.75 % cream   . omeprazole (PRILOSEC) 40 MG capsule   . potassium chloride SA (KLOR-CON) 20 MEQ tablet   . triamcinolone cream (KENALOG) 0.1 %     Follow-up: No follow-ups on file.   Crecencio Mc, MD

## 2020-05-28 LAB — FOLATE RBC: RBC Folate: 527 ng/mL RBC (ref >280)

## 2020-05-29 DIAGNOSIS — R5383 Other fatigue: Secondary | ICD-10-CM | POA: Insufficient documentation

## 2020-05-29 NOTE — Assessment & Plan Note (Addendum)
Persistent , aggravated by use of lasix.  Advised to increase potassium supplementation to 20 meQ  Daily   Lab Results  Component Value Date   NA 142 05/27/2020   K 3.3 (L) 05/27/2020   CL 94 (L) 05/27/2020   CO2 39 (H) 05/27/2020

## 2020-05-29 NOTE — Assessment & Plan Note (Signed)
Etiology not evident by serologies.  Given her morning fatigue,  Polycythemia,  Edema and MR   Need to consider OSA as a cause

## 2020-05-29 NOTE — Assessment & Plan Note (Signed)
She is sleeping with the use of trazodone but wakes up exhausted.  Will recommend sleep study given progression of her mitral valve insufficiency

## 2020-05-29 NOTE — Progress Notes (Signed)
Ms Dodds,  Your potassium still low,  and your other labs indicate that the furosemide is really dehydrating you.  I recommend increasing your potassium to 20 meQ daily and suspending the furosemide for 48 hours , then resuming it not more than every other day .  Your B12 is normal , and your diabetes remains under good control.    Your labs and history of elevated hemoglobin,  fatigue and leg swelling  suggest to me that your fatigue may be coming from undiagnosed sleep apnea and I would like to order a sleep study.    If you have had a sleep study in the last 5 years that I am not aware of,  please let me know.

## 2020-05-29 NOTE — Assessment & Plan Note (Signed)
Secondary to venous insufficiency but using lasix daily.  May have OSA aggravating her condition

## 2020-05-29 NOTE — Assessment & Plan Note (Addendum)
Remains Well controlled on diet alone.    She  has microalbuminuria and cerebrovascular disease  She has developed early neuroapthy type symptoms on the soles of her feet. She is already taking an ARB. She is intolerant of all statins   due to recurrent myalgias  .  Aspirin is relatively contraindicated since she is already taking Eliquis .  Lab Results  Component Value Date   HGBA1C 7.1 (H) 05/27/2020   Lab Results  Component Value Date   MICROALBUR 1.4 12/15/2019

## 2020-05-29 NOTE — Assessment & Plan Note (Addendum)
Chronic, managed with surveillance currently.  Used to donate blood in the past  Etiology may be undiagnosed OSA   Lab Results  Component Value Date   WBC 5.8 05/27/2020   HGB 15.7 (H) 05/27/2020   HCT 47.1 (H) 05/27/2020   MCV 96.1 05/27/2020   PLT 196.0 05/27/2020

## 2020-06-23 ENCOUNTER — Other Ambulatory Visit: Payer: Self-pay

## 2020-06-23 ENCOUNTER — Ambulatory Visit (INDEPENDENT_AMBULATORY_CARE_PROVIDER_SITE_OTHER): Payer: Medicare Other | Admitting: Internal Medicine

## 2020-06-23 ENCOUNTER — Encounter: Payer: Self-pay | Admitting: Internal Medicine

## 2020-06-23 VITALS — BP 122/66 | HR 45 | Temp 97.4°F | Resp 15 | Ht 63.5 in | Wt 174.8 lb

## 2020-06-23 DIAGNOSIS — I10 Essential (primary) hypertension: Secondary | ICD-10-CM

## 2020-06-23 DIAGNOSIS — E876 Hypokalemia: Secondary | ICD-10-CM | POA: Diagnosis not present

## 2020-06-23 DIAGNOSIS — N952 Postmenopausal atrophic vaginitis: Secondary | ICD-10-CM | POA: Diagnosis not present

## 2020-06-23 DIAGNOSIS — E1129 Type 2 diabetes mellitus with other diabetic kidney complication: Secondary | ICD-10-CM

## 2020-06-23 DIAGNOSIS — I4811 Longstanding persistent atrial fibrillation: Secondary | ICD-10-CM

## 2020-06-23 DIAGNOSIS — R300 Dysuria: Secondary | ICD-10-CM

## 2020-06-23 DIAGNOSIS — R809 Proteinuria, unspecified: Secondary | ICD-10-CM

## 2020-06-23 LAB — URINALYSIS, ROUTINE W REFLEX MICROSCOPIC
Bilirubin Urine: NEGATIVE
Hgb urine dipstick: NEGATIVE
Ketones, ur: NEGATIVE
Leukocytes,Ua: NEGATIVE
Nitrite: NEGATIVE
RBC / HPF: NONE SEEN (ref 0–?)
Specific Gravity, Urine: 1.025 (ref 1.000–1.030)
Total Protein, Urine: NEGATIVE
Urine Glucose: NEGATIVE
Urobilinogen, UA: 0.2 (ref 0.0–1.0)
pH: 6 (ref 5.0–8.0)

## 2020-06-23 LAB — MAGNESIUM: Magnesium: 1.7 mg/dL (ref 1.5–2.5)

## 2020-06-23 MED ORDER — ESTRADIOL 0.1 MG/GM VA CREA: 1.0000 g | TOPICAL_CREAM | VAGINAL | 12 refills | Status: DC

## 2020-06-23 NOTE — Progress Notes (Unsigned)
Subjective:  Patient ID: Linda [name], female    DOB: [birth year]  Age: 87 y.o. MRN: [id number]  CC: The primary encounter diagnosis was Dysuria. Diagnoses of Atrophic vaginitis, Hypokalemia, Controlled type 2 diabetes mellitus with microalbuminuria, without long-term current use of insulin ([place]), Essential hypertension, and Longstanding persistent atrial fibrillation ([place]) were also pertinent to this visit.  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   This visit occurred during the SARS-CoV-2 public health emergency.  Safety protocols were in place, including screening questions prior to the visit, additional usage of staff PPE, and extensive cleaning of exam room while observing appropriate contact time as indicated for disinfecting solutions.   Her fatigue and edema have improved.  Sleeping better. Using trazodone   Thinks her fluid retention has improved.  Attributes it to  Use of magnesium.  using lasix daily .  Sees cardiology on jan 20 .  Has been weighing daily and noted a weight gain of 3 lbs on Sunday morning with significant ankle edema (lymphedema) and she took 40 mg lasix.    Hypokalemia:  She has been  taking potassium supplement 1-2  Daily    T2DM:  She  feels generally well,  But is not  exercising regularly or trying to lose weight. Checking  blood sugars less than once daily at variable times, usually only if she feels she may be having a hypoglycemic event. .  BS have been under 130 fasting and < 150 post prandially.  Denies any recent hypoglyemic events.  Taking   medications as directed. Following a carbohydrate modified diet 6 days per week. Denies numbness, burning and tingling of extremities. Appetite is good.   Not exercising .  Still feels weak.    Outpatient Medications Prior to Visit  Medication Sig Dispense Refill  . cetirizine (ZYRTEC) 10 MG tablet  Take 10 mg by mouth daily as needed for allergies.    . Cholecalciferol (VITAMIN D) 2000 units tablet Take 2,000 Units by mouth daily.    Marland Kitchen ELIQUIS 5 MG TABS tablet Take 1 tablet (5 mg total) by mouth 2 (two) times daily. 60 tablet 0  . furosemide (LASIX) 20 MG tablet Take 20 mg by mouth daily.    Marland Kitchen LORazepam (ATIVAN) 0.5 MG tablet Take 1 tablet (0.5 mg total) by mouth at bedtime as needed for anxiety. 30 tablet 0  . losartan (COZAAR) 25 MG tablet Take 1 tablet (25 mg total) by mouth daily. 90 tablet 1  . pantoprazole (PROTONIX) 40 MG tablet Take 40 mg by mouth 2 (two) times daily.    . potassium chloride (KLOR-CON) 10 MEQ tablet Take 10 mEq by mouth daily.    . traZODone (DESYREL) 50 MG tablet     . estradiol (ESTRACE) 0.1 MG/GM vaginal cream Place 1 g vaginally 3 (three) times a week. Pea sized amount per urethra 42.5 g 12  . erythromycin ophthalmic ointment 1 application at bedtime. (Patient not taking: Reported on 06/23/2020)     No facility-administered medications prior to visit.    Review of Systems;  Patient denies headache, fevers, malaise, unintentional weight loss, skin rash, eye pain, sinus congestion and sinus pain, sore throat, dysphagia,  hemoptysis , cough, dyspnea, wheezing, chest pain, palpitations, orthopnea, edema, abdominal pain, nausea, melena, diarrhea, constipation, flank pain, dysuria, hematuria, urinary  Frequency, nocturia, numbness, tingling, seizures,  Focal  weakness, Loss of consciousness,  Tremor, insomnia, depression, anxiety, and suicidal ideation.      Objective:  BP 122/66 (BP Location: Left Arm, Patient Position: Sitting, Cuff Size: Normal)   Pulse (!) 45   Temp (!) 97.4 F (36.3 C) (Oral)   Resp 15   Ht 5' 3.5" (1.613 m)   Wt 174 lb 12.8 oz (79.3 kg)   SpO2 97%   BMI 30.48 kg/m   BP Readings from Last 3 Encounters:  06/23/20 122/66  05/27/20 126/88  02/03/20 (!) 167/84    Wt Readings from Last 3 Encounters:  06/23/20 174 lb 12.8 oz (79.3 kg)   05/27/20 176 lb 6.4 oz (80 kg)  04/15/20 179 lb (81.2 kg)    General appearance: alert, cooperative and appears stated age Ears: normal TM's and external ear canals both ears Throat: lips, mucosa, and tongue normal; teeth and gums normal Neck: no adenopathy, no carotid bruit, supple, symmetrical, trachea midline and thyroid not enlarged, symmetric, no tenderness/mass/nodules Back: symmetric, no curvature. ROM normal. No CVA tenderness. Lungs: clear to auscultation bilaterally Heart: regular rate and rhythm, S1, S2 normal, no murmur, click, rub or gallop Abdomen: soft, non-tender; bowel sounds normal; no masses,  no organomegaly Pulses: 2+ and symmetric Skin: Skin color, texture, turgor normal. No rashes or lesions Lymph nodes: Cervical, supraclavicular, and axillary nodes normal.  Lab Results  Component Value Date   HGBA1C 7.1 (H) 05/27/2020   HGBA1C 6.6 (H) 12/15/2019   HGBA1C 6.8 (H) 11/27/2018    Lab Results  Component Value Date   CREATININE 0.93 05/27/2020   CREATININE 0.91 12/15/2019   CREATININE 0.84 11/27/2018    Lab Results  Component Value Date   WBC 5.8 05/27/2020   HGB 15.7 (H) 05/27/2020   HCT 47.1 (H) 05/27/2020   PLT 196.0 05/27/2020   GLUCOSE 90 05/27/2020   CHOL 186 12/15/2019   TRIG 67.0 12/15/2019   HDL 63.10 12/15/2019   LDLDIRECT 90.0 10/23/2016   LDLCALC 109 (H) 12/15/2019   ALT 21 05/27/2020   AST 24 05/27/2020   NA 142 05/27/2020   K 3.3 (L) 05/27/2020   CL 94 (L) 05/27/2020   CREATININE 0.93 05/27/2020   BUN 29 (H) 05/27/2020   CO2 39 (H) 05/27/2020   TSH 1.91 05/27/2020   INR 1.78 05/11/2018   HGBA1C 7.1 (H) 05/27/2020   MICROALBUR 1.4 12/15/2019    No results found.  Assessment & Plan:   Problem List Items Addressed This Visit      Unprioritized   ATRIAL FIBRILLATION    Well controlled.  Anticoagulated on Eliquis (treatment failure on coumadin,  Had CVA)      Controlled type 2 diabetes mellitus with microalbuminuria,  without long-term current use of insulin ([facility])    Remains Well controlled on diet alone.    She  has microalbuminuria and cerebrovascular disease  She has developed early neuroapthy type symptoms on the soles of her feet. She is already taking an ARB. She is intolerant of all statins   due to recurrent myalgias  .  Aspirin is relatively contraindicated since she is already taking Eliquis .  Lab Results  Component Value Date   HGBA1C 7.1 (H) 05/27/2020   Lab Results  Component Value Date   MICROALBUR 1.4 12/15/2019         Dysuria - Primary    She has atrophic vaginitis so her dysuria was evaluated with  UA and culture. She has no signs of infection  Relevant Orders   Urinalysis, Routine w reflex microscopic (Completed)   Urine Culture (Completed)   Essential hypertension    Well controlled on current regimen. Renal function stable, no changes today.  Lab Results  Component Value Date   CREATININE 0.93 05/27/2020         Hypokalemia   Relevant Orders   Magnesium (Completed)    Other Visit Diagnoses    Atrophic vaginitis       Relevant Medications   estradiol (ESTRACE) 0.1 MG/GM vaginal cream      I have discontinued Becki M. Donna's erythromycin. I am also having her maintain her Vitamin D, cetirizine, LORazepam, pantoprazole, losartan, Eliquis, furosemide, potassium chloride, traZODone, and estradiol.  Meds ordered this encounter  Medications  . DISCONTD: estradiol (ESTRACE) 0.1 MG/GM vaginal cream    Sig: Place 1 g vaginally 3 (three) times a week. Pea sized amount per urethra    Dispense:  42.5 g    Refill:  12  . estradiol (ESTRACE) 0.1 MG/GM vaginal cream    Sig: Place 1 g vaginally 3 (three) times a week. Pea sized amount per urethra    Dispense:  42.5 g    Refill:  12    Medications Discontinued During This Encounter  Medication Reason  . erythromycin ophthalmic ointment   . estradiol (ESTRACE) 0.1 MG/GM vaginal cream Reorder  . estradiol  (ESTRACE) 0.1 MG/GM vaginal cream     Follow-up: No follow-ups on file.   Crecencio Mc, MD

## 2020-06-23 NOTE — Patient Instructions (Addendum)
Alternative to Boost glucose control  To  [employer] Protein is great tasting,   very low sugar and available of < $2 serving at [employer] and  In bulk for $1.50/serving at [employer] and [employer]  .    Nutritional analysis :  160 cal  30 g protein  1 g sugar 50% calcium needs   [employer] and [employer]   Ok to suspend trazodone to see if magnesium is improving sleep without help.   Please let me know  WHEN YOU HAD THE TETANUS BOOSTER  ASAP   IF YOU WANT TO HAVE YOUR URINE CHECKED FOR PROTEIN,  JUST CALL THE OFFICE SO THEY CAN PUT YOU ON THE LAB SCHEDULE

## 2020-06-24 LAB — URINE CULTURE
MICRO NUMBER:: 11385077
SPECIMEN QUALITY:: ADEQUATE

## 2020-06-24 NOTE — Assessment & Plan Note (Signed)
Remains Well controlled on diet alone.    She  has microalbuminuria and cerebrovascular disease  She has developed early neuroapthy type symptoms on the soles of her feet. She is already taking an ARB. She is intolerant of all statins   due to recurrent myalgias  .  Aspirin is relatively contraindicated since she is already taking Eliquis .  Lab Results  Component Value Date   HGBA1C 7.1 (H) 05/27/2020   Lab Results  Component Value Date   MICROALBUR 1.4 12/15/2019    

## 2020-06-24 NOTE — Assessment & Plan Note (Addendum)
She has atrophic vaginitis so her dysuria was evaluated with  UA and culture. She has no signs of infection

## 2020-06-24 NOTE — Assessment & Plan Note (Signed)
Well controlled.  Anticoagulated on Eliquis (treatment failure on coumadin,  Had CVA) 

## 2020-06-24 NOTE — Assessment & Plan Note (Signed)
Well controlled on current regimen. Renal function stable, no changes today.  Lab Results  Component Value Date   CREATININE 0.93 05/27/2020

## 2020-06-26 NOTE — Progress Notes (Signed)
Ms Knudsen,  There is no evidence of UTI by culture results , and your magnesium level is normal.  Continue your current medications.   Regards,   Deborra Medina, MD

## 2020-07-09 DIAGNOSIS — I340 Nonrheumatic mitral (valve) insufficiency: Secondary | ICD-10-CM | POA: Insufficient documentation

## 2020-07-09 DIAGNOSIS — I059 Rheumatic mitral valve disease, unspecified: Secondary | ICD-10-CM | POA: Insufficient documentation

## 2020-07-20 ENCOUNTER — Ambulatory Visit
Admission: RE | Admit: 2020-07-20 | Discharge: 2020-07-20 | Disposition: A | Discharge status: Home / Self Care | Payer: Medicare Other | Source: Ambulatory Visit | Attending: Internal Medicine | Admitting: Internal Medicine

## 2020-07-20 ENCOUNTER — Other Ambulatory Visit: Payer: Self-pay

## 2020-07-20 DIAGNOSIS — Z1231 Encounter for screening mammogram for malignant neoplasm of breast: Secondary | ICD-10-CM | POA: Diagnosis not present

## 2020-08-04 ENCOUNTER — Other Ambulatory Visit (INDEPENDENT_AMBULATORY_CARE_PROVIDER_SITE_OTHER): Payer: Medicare Other

## 2020-08-04 ENCOUNTER — Other Ambulatory Visit: Payer: Self-pay

## 2020-08-04 DIAGNOSIS — R300 Dysuria: Secondary | ICD-10-CM

## 2020-08-04 LAB — URINALYSIS, ROUTINE W REFLEX MICROSCOPIC
Bilirubin Urine: NEGATIVE
Ketones, ur: NEGATIVE
Leukocytes,Ua: NEGATIVE
Nitrite: POSITIVE — AB
RBC / HPF: NONE SEEN (ref 0–?)
Specific Gravity, Urine: 1.02 (ref 1.000–1.030)
Total Protein, Urine: NEGATIVE
Urine Glucose: NEGATIVE
Urobilinogen, UA: 1 (ref 0.0–1.0)
pH: 6 (ref 5.0–8.0)

## 2020-08-06 ENCOUNTER — Other Ambulatory Visit: Payer: Self-pay

## 2020-08-06 LAB — URINE CULTURE
MICRO NUMBER:: 11541680
SPECIMEN QUALITY:: ADEQUATE

## 2020-08-06 MED ORDER — NITROFURANTOIN MONOHYD MACRO 100 MG PO CAPS: 100.0000 mg | ORAL_CAPSULE | Freq: Two times a day (BID) | ORAL | 0 refills | Status: DC

## 2020-08-06 NOTE — Addendum Note (Signed)
Addended by: Crecencio Mc on: 08/06/2020 04:55 PM   Modules accepted: Orders

## 2020-08-23 ENCOUNTER — Ambulatory Visit (INDEPENDENT_AMBULATORY_CARE_PROVIDER_SITE_OTHER): Payer: Medicare Other | Admitting: Internal Medicine

## 2020-08-23 ENCOUNTER — Telehealth: Payer: Self-pay

## 2020-08-23 ENCOUNTER — Encounter: Payer: Self-pay | Admitting: Internal Medicine

## 2020-08-23 ENCOUNTER — Other Ambulatory Visit: Payer: Self-pay

## 2020-08-23 VITALS — BP 120/78 | HR 89 | Temp 97.9°F | Resp 16 | Ht 63.5 in | Wt 181.0 lb

## 2020-08-23 DIAGNOSIS — R531 Weakness: Secondary | ICD-10-CM | POA: Diagnosis not present

## 2020-08-23 DIAGNOSIS — E1129 Type 2 diabetes mellitus with other diabetic kidney complication: Secondary | ICD-10-CM

## 2020-08-23 DIAGNOSIS — R809 Proteinuria, unspecified: Secondary | ICD-10-CM | POA: Diagnosis not present

## 2020-08-23 DIAGNOSIS — R600 Localized edema: Secondary | ICD-10-CM

## 2020-08-23 DIAGNOSIS — D6869 Other thrombophilia: Secondary | ICD-10-CM | POA: Diagnosis not present

## 2020-08-23 DIAGNOSIS — I482 Chronic atrial fibrillation, unspecified: Secondary | ICD-10-CM

## 2020-08-23 DIAGNOSIS — M6281 Muscle weakness (generalized): Secondary | ICD-10-CM

## 2020-08-23 DIAGNOSIS — R300 Dysuria: Secondary | ICD-10-CM

## 2020-08-23 MED ORDER — TETANUS-DIPHTH-ACELL PERTUSSIS 5-2.5-18.5 LF-MCG/0.5 IM SUSY: 0.5000 mL | PREFILLED_SYRINGE | Freq: Once | INTRAMUSCULAR | 0 refills | Status: AC

## 2020-08-23 NOTE — Progress Notes (Signed)
Subjective:  Patient ID: Linda [name], female    DOB: 11-30-1933  Age: 87 y.o. MRN: [id number]  CC: The primary encounter diagnosis was Controlled type 2 diabetes mellitus with microalbuminuria, without long-term current use of insulin ([place]). Diagnoses of Acquired thrombophilia ([place]), Bilateral leg edema, Generalized weakness, Chronic atrial fibrillation ([place]), Dysuria, and Localized edema were also pertinent to this visit.  HPI [facility] presents for follow up on type 2 DM with obesity , hypertension and hyperlipidemia  .This visit occurred during the SARS-CoV-2 public health emergency.  Safety protocols were in place, including screening questions prior to the visit, additional usage of staff PPE, and extensive cleaning of exam room while observing appropriate contact time as indicated for disinfecting solutions.   Linda [name] is an 87 yr old female with type 2 DM, hypertension , yperlipidemia and chronic atrial fibrillation who presents for follow up.   She has been having increased peripheral edema with weight gain and is having more difficulty ambulating  .  Using a cane now . And has hired a Environmental health practitioner.  She is accompanied by Linda [name] who helps with driving, , housework and grocery shopping,   Ankles and lower legs have developed considered swelling.  She notes that she is Occasionally short of breath at rest.   Taking lasix 20 mg daily.  Daily weights have been stable.  Takes chlorthalidone 25 mg from [facility]   Feeling weak ,  Has not had PT .  Home PT discussed .  Needs assistance from cane getting out of chair.  Does not use a walker.   T2DM:  She is not  exercising regularly or trying to lose weight. Checking  blood sugars less than once daily at variable times, usually only if she feels she may be having a hypoglycemic event. .  BS have been under 130 fasting and < 150 post prandially.  Denies any recent hypoglyemic events.  Taking   medications as directed.  Following a carbohydrate modified diet 6 days per week. Denies numbness, burning and tingling of extremities. Appetite is good.      Outpatient Medications Prior to Visit  Medication Sig Dispense Refill  . cetirizine (ZYRTEC) 10 MG tablet Take 10 mg by mouth daily as needed for allergies.    . Cholecalciferol (VITAMIN D) 2000 units tablet Take 2,000 Units by mouth daily.    Marland Kitchen ELIQUIS 5 MG TABS tablet Take 1 tablet (5 mg total) by mouth 2 (two) times daily. 60 tablet 0  . estradiol (ESTRACE) 0.1 MG/GM vaginal cream Place 1 g vaginally 3 (three) times a week. Pea sized amount per urethra 42.5 g 12  . furosemide (LASIX) 20 MG tablet Take 20 mg by mouth daily.    Marland Kitchen LORazepam (ATIVAN) 0.5 MG tablet Take 1 tablet (0.5 mg total) by mouth at bedtime as needed for anxiety. 30 tablet 0  . losartan (COZAAR) 25 MG tablet Take 1 tablet (25 mg total) by mouth daily. 90 tablet 1  . pantoprazole (PROTONIX) 40 MG tablet Take 40 mg by mouth 2 (two) times daily.    . potassium chloride (KLOR-CON) 10 MEQ tablet Take 10 mEq by mouth daily.    . traZODone (DESYREL) 50 MG tablet     . nitrofurantoin, macrocrystal-monohydrate, (MACROBID) 100 MG capsule Take 1 capsule (100 mg total) by mouth 2 (two) times daily. (Patient not taking: Reported on 08/23/2020) 10 capsule 0   No facility-administered medications prior to visit.  Review of Systems;  Patient denies headache, fevers, malaise, unintentional weight loss, skin rash, eye pain, sinus congestion and sinus pain, sore throat, dysphagia,  hemoptysis , cough, dyspnea, wheezing, chest pain, palpitations, orthopnea, edema, abdominal pain, nausea, melena, diarrhea, constipation, flank pain, dysuria, hematuria, urinary  Frequency, nocturia, numbness, tingling, seizures,  Focal weakness, Loss of consciousness,  Tremor, insomnia, depression, anxiety, and suicidal ideation.      Objective:  BP 120/78 (BP Location: Left Arm, Patient Position: Sitting, Cuff Size: Normal)    Pulse 89   Temp 97.9 F (36.6 C) (Oral)   Resp 16   Ht 5' 3.5" (1.613 m)   Wt 181 lb (82.1 kg)   SpO2 93%   BMI 31.56 kg/m   BP Readings from Last 3 Encounters:  08/23/20 120/78  06/23/20 122/66  05/27/20 126/88    Wt Readings from Last 3 Encounters:  08/23/20 181 lb (82.1 kg)  06/23/20 174 lb 12.8 oz (79.3 kg)  05/27/20 176 lb 6.4 oz (80 kg)    General appearance: alert, cooperative and appears stated age Ears: normal TM's and external ear canals both ears Throat: lips, mucosa, and tongue normal; teeth and gums normal Neck: no adenopathy, no carotid bruit, supple, symmetrical, trachea midline and thyroid not enlarged, symmetric, no tenderness/mass/nodules Back: symmetric, no curvature. ROM normal. No CVA tenderness. Lungs: clear to auscultation bilaterally Heart: regular rate and rhythm, S1, S2 normal, no murmur, click, rub or gallop Abdomen: soft, non-tender; bowel sounds normal; no masses,  no organomegaly Pulses: 2+ and symmetric Skin: Skin color, texture, turgor normal. No rashes or lesions Lymph nodes: Cervical, supraclavicular, and axillary nodes normal.  Lab Results  Component Value Date   HGBA1C 7.1 (H) 05/27/2020   HGBA1C 6.6 (H) 12/15/2019   HGBA1C 6.8 (H) 11/27/2018    Lab Results  Component Value Date   CREATININE 0.93 05/27/2020   CREATININE 0.91 12/15/2019   CREATININE 0.84 11/27/2018    Lab Results  Component Value Date   WBC 5.8 05/27/2020   HGB 15.7 (H) 05/27/2020   HCT 47.1 (H) 05/27/2020   PLT 196.0 05/27/2020   GLUCOSE 90 05/27/2020   CHOL 186 12/15/2019   TRIG 67.0 12/15/2019   HDL 63.10 12/15/2019   LDLDIRECT 90.0 10/23/2016   LDLCALC 109 (H) 12/15/2019   ALT 21 05/27/2020   AST 24 05/27/2020   NA 142 05/27/2020   K 3.3 (L) 05/27/2020   CL 94 (L) 05/27/2020   CREATININE 0.93 05/27/2020   BUN 29 (H) 05/27/2020   CO2 39 (H) 05/27/2020   TSH 1.91 05/27/2020   INR 1.78 05/11/2018   HGBA1C 7.1 (H) 05/27/2020   MICROALBUR 1.4  12/15/2019    MM 3D SCREEN BREAST BILATERAL  Result Date: 07/20/2020 CLINICAL DATA:  Screening. EXAM: DIGITAL SCREENING BILATERAL MAMMOGRAM WITH TOMO AND CAD COMPARISON:  Previous exam(s). ACR Breast Density Category b: There are scattered areas of fibroglandular density. FINDINGS: There are no findings suspicious for malignancy. The images were evaluated with computer-aided detection. IMPRESSION: No mammographic evidence of malignancy. A result letter of this screening mammogram will be mailed directly to the patient. RECOMMENDATION: Screening mammogram in one year. (Code:SM-B-01Y) BI-RADS CATEGORY  1: Negative. Electronically Signed   By: Claudie Revering M.D.   On: 07/20/2020 16:23    Assessment & Plan:   Problem List Items Addressed This Visit      Unprioritized   Acquired thrombophilia ([place])   Relevant Orders   AMB Referral to [place]  FIBRILLATION   Relevant Orders   AMB Referral to [facility]   Controlled type 2 diabetes mellitus with microalbuminuria, without long-term current use of insulin ([place]) - Primary    Has been  Well controlled on diet alone.    She  has microalbuminuria and cerebrovascular disease  She has developed early neuroapthy type symptoms on the soles of her feet. She is already taking an ARB. She is intolerant of all statins   due to recurrent myalgias  .  Aspirin is relatively contraindicated since she is already taking Eliquis .  She will return for labs   Lab Results  Component Value Date   HGBA1C 7.1 (H) 05/27/2020   Lab Results  Component Value Date   MICROALBUR 1.4 12/15/2019         Relevant Orders   Hemoglobin A1c   Comprehensive metabolic panel   Microalbumin / creatinine urine ratio   Lipid panel   AMB Referral to [place]   Dysuria    She has atrophic vaginitis so her dysuria is always evaluated with  UA and culture. She has no signs of infection       Edema    She has bilateral  incompetent varicosities but may have acquired lymphedema in the latter years.  Vascular evaluation ordered.  Advised to increase lasix to 40 mg every other day alternating with 20 mg daily        Other Visit Diagnoses    Bilateral leg edema       Relevant Orders   Ambulatory referral to Vascular Surgery   AMB Referral to [facility]   Generalized weakness       Relevant Orders   AMB Referral to [place]      I have discontinued Vonita M. Worthy's nitrofurantoin (macrocrystal-monohydrate). I am also having her start on Tdap. Additionally, I am having her maintain her Vitamin D, cetirizine, LORazepam, pantoprazole, losartan, Eliquis, furosemide, potassium chloride, traZODone, and estradiol.  Meds ordered this encounter  Medications  . Tdap (BOOSTRIX) 5-2.5-18.5 LF-MCG/0.5 injection    Sig: Inject 0.5 mLs into the muscle once for 1 dose.    Dispense:  0.5 mL    Refill:  0    Medications Discontinued During This Encounter  Medication Reason  . nitrofurantoin, macrocrystal-monohydrate, (MACROBID) 100 MG capsule Completed Course    Follow-up: No follow-ups on file.   Crecencio Mc, MD

## 2020-08-23 NOTE — Patient Instructions (Addendum)
You have gained 7 lbs since January .  This appears to be FLUID RETENTION   Please take an extra dose of furosemide every other day until you see dr Ubaldo Glassing  Referral to rule out lymphedema is also in progress  Return for labs on or after march 9

## 2020-08-23 NOTE — Chronic Care Management (AMB) (Signed)
  Chronic Care Management   Note  08/23/2020 Name: Linda [name] MRN: [id number] DOB: 21-Nov-1933  Linda [name] is a 87 y.o. year old female who is a primary care patient of Crecencio Mc, MD. I reached out to [facility] by phone today in response to a referral sent by Linda [name]'s PCP, Crecencio Mc, MD     Linda [name] was given information about Chronic Care Management services today including:  1. CCM service includes personalized support from designated clinical staff supervised by her physician, including individualized plan of care and coordination with other care providers 2. 24/7 contact phone numbers for assistance for urgent and routine care needs. 3. Service will only be billed when office clinical staff spend 20 minutes or more in a month to coordinate care. 4. Only one practitioner may furnish and bill the service in a calendar month. 5. The patient may stop CCM services at any time (effective at the end of the month) by phone call to the office staff. 6. The patient will be responsible for cost sharing (co-pay) of up to 20% of the service fee (after annual deductible is met).  Patient agreed to services and verbal consent obtained.   Follow up plan: Telephone appointment with care management team member scheduled for:08/25/2020  Noreene Larsson, [address], [place], [place] [id number] Direct Dial: [phone] [email or web address]: [email or web address]

## 2020-08-24 DIAGNOSIS — M6281 Muscle weakness (generalized): Secondary | ICD-10-CM | POA: Insufficient documentation

## 2020-08-24 NOTE — Assessment & Plan Note (Signed)
She has atrophic vaginitis so her dysuria is always evaluated with  UA and culture. She has no signs of infection

## 2020-08-24 NOTE — Assessment & Plan Note (Signed)
With loss of balance and confidence walking.  Home PT ordered as she is now unable to leave the house without assistance

## 2020-08-24 NOTE — Assessment & Plan Note (Signed)
She has bilateral incompetent varicosities but may have acquired lymphedema in the latter years.  Vascular evaluation ordered.  Advised to increase lasix to 40 mg every other day alternating with 20 mg daily

## 2020-08-24 NOTE — Assessment & Plan Note (Signed)
Has been  Well controlled on diet alone.    She  has microalbuminuria and cerebrovascular disease  She has developed early neuroapthy type symptoms on the soles of her feet. She is already taking an ARB. She is intolerant of all statins   due to recurrent myalgias  .  Aspirin is relatively contraindicated since she is already taking Eliquis .  She will return for labs   Lab Results  Component Value Date   HGBA1C 7.1 (H) 05/27/2020   Lab Results  Component Value Date   MICROALBUR 1.4 12/15/2019

## 2020-08-25 ENCOUNTER — Ambulatory Visit (INDEPENDENT_AMBULATORY_CARE_PROVIDER_SITE_OTHER): Payer: Medicare Other | Admitting: *Deleted

## 2020-08-25 DIAGNOSIS — R5383 Other fatigue: Secondary | ICD-10-CM

## 2020-08-25 DIAGNOSIS — I4811 Longstanding persistent atrial fibrillation: Secondary | ICD-10-CM | POA: Diagnosis not present

## 2020-08-25 DIAGNOSIS — R600 Localized edema: Secondary | ICD-10-CM

## 2020-08-25 DIAGNOSIS — R531 Weakness: Secondary | ICD-10-CM

## 2020-08-25 DIAGNOSIS — I517 Cardiomegaly: Secondary | ICD-10-CM

## 2020-08-25 NOTE — Chronic Care Management (AMB) (Addendum)
Chronic Care Management   CCM RN Visit Note  08/25/2020 Name: Linda [name] MRN: [id number] DOB: [birth year]  Subjective: Linda [name] is a 87 y.o. year old female who is a primary care patient of Crecencio Mc, MD. The care management team was consulted for assistance with disease management and care coordination needs.    Engaged with patient by telephone for initial visit in response to provider referral for case management and/or care coordination services.   Consent to Services:  The patient was given the following information about Chronic Care Management services today, agreed to services, and gave verbal consent: 1. CCM service includes personalized support from designated clinical staff supervised by the primary care provider, including individualized plan of care and coordination with other care providers 2. 24/7 contact phone numbers for assistance for urgent and routine care needs. 3. Service will only be billed when office clinical staff spend 20 minutes or more in a month to coordinate care. 4. Only one practitioner may furnish and bill the service in a calendar month. 5.The patient may stop CCM services at any time (effective at the end of the month) by phone call to the office staff. 6. The patient will be responsible for cost sharing (co-pay) of up to 20% of the service fee (after annual deductible is met). Patient agreed to services and consent obtained.  Patient agreed to services and verbal consent obtained.   Assessment: Review of patient past medical history, allergies, medications, health status, including review of consultants reports, laboratory and other test data, was performed as part of comprehensive evaluation and provision of chronic care management services.   SDOH (Social Determinants of Health) assessments and interventions performed:  SDOH Interventions    Flowsheet Row Most Recent Value  SDOH Interventions   Food Insecurity Interventions Intervention Not  Indicated  Financial Strain Interventions Intervention Not Indicated  [declines CCM Pharmacist referral at this time, states her Cardiology office is working on medication [place] Interventions Intervention Not Indicated  Intimate Partner Violence Interventions Intervention Not Indicated  Physical Activity Interventions Intervention Not Indicated  Transportation Interventions Intervention Not Indicated        CCM Care Plan  Allergies  Allergen Reactions   Barium Iodide     Other reaction(s): Unknown   Crestor [Rosuvastatin Calcium] Other (See Comments)    Myalgia    Penicillins Rash   Sulfa Antibiotics Rash   Sulfonamide Derivatives Rash    Outpatient Encounter Medications as of 08/25/2020  Medication Sig Note   cetirizine (ZYRTEC) 10 MG tablet Take 10 mg by mouth daily as needed for allergies.    Cholecalciferol (VITAMIN D) 2000 units tablet Take 2,000 Units by mouth daily.    ELIQUIS 5 MG TABS tablet Take 1 tablet (5 mg total) by mouth 2 (two) times daily.    estradiol (ESTRACE) 0.1 MG/GM vaginal cream Place 1 g vaginally 3 (three) times a week. Pea sized amount per urethra    furosemide (LASIX) 20 MG tablet Take 20 mg by mouth daily. 08/25/2020: Reports taking 40 mg alternating with 20 mg every other day per provider instructions   LORazepam (ATIVAN) 0.5 MG tablet Take 1 tablet (0.5 mg total) by mouth at bedtime as needed for anxiety.    losartan (COZAAR) 25 MG tablet Take 1 tablet (25 mg total) by mouth daily. 08/25/2020: Reports taking 1/2 tablet nightly   potassium chloride (KLOR-CON) 10 MEQ tablet Take 10 mEq by mouth daily.    traZODone (DESYREL) 50 MG  tablet     pantoprazole (PROTONIX) 40 MG tablet Take 40 mg by mouth 2 (two) times daily. (Patient not taking: Reported on 08/25/2020) 08/25/2020: Reports not taking at this time   No facility-administered encounter medications on file as of 08/25/2020.    Patient Active Problem List   Diagnosis Date Noted   Proximal  muscle weakness 08/24/2020   Mitral valve disease 07/09/2020   Fatigue 05/29/2020   Hypokalemia 12/16/2019   Myalgia due to statin 12/15/2019   Acquired thrombophilia ([place]) 12/15/2019   Skin lesion of hand 08/27/2018   Hospital discharge follow-up 05/25/2018   History of CVA (cerebrovascular accident) without residual deficits 05/10/2018   Parent-child estrangement nec 02/09/2018   Basal cell carcinoma (BCC) of right lower extremity 08/08/2017   Hematuria, gross 08/08/2017   Extremity atherosclerosis with intermittent claudication ([place]) 08/08/2017   Generalized anxiety disorder 01/31/2017   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   Controlled type 2 diabetes mellitus with microalbuminuria, without long-term current use of insulin ([place]) 01/20/2016   Insomnia secondary to anxiety 10/09/2015   Cardiomyopathy, idiopathic ([place]) 09/03/2015   Polycythemia, secondary 07/04/2015   Pre-syncope 07/02/2015   Dysuria 12/28/2014   Osteopenia 08/21/2014   Gastritis 05/26/2014   GERD (gastroesophageal reflux disease) 03/04/2014   Medicare annual wellness visit, subsequent 01/13/2014   Essential hypertension 11/04/2013   Screening for breast cancer 07/01/2013   Left shoulder pain 03/21/2013   Hoarseness of voice 03/21/2013   Edema 09/05/2010   VENTRICULAR HYPERTROPHY, LEFT 08/16/2010   Hyperlipidemia 06/03/2010   ATRIAL FIBRILLATION 08/13/2009    Conditions to be addressed/monitored:Atrial Fibrillation, HTN and Weakness  Care Plan : Heart Failure  Updates made by Leona Singleton, RN since 08/25/2020 12:00 AM   Problem: Heart Failure (increasing edema and shortness of breath   Priority: High   Long-Range Goal: Symptom Exacerbation Prevented or Minimized   Start Date: 08/25/2020  Expected End Date: 02/15/2021  Priority: High  Current Barriers:  Knowledge deficits related to basic heart failure pathophysiology and self care management as evidenced by increasing shortness of breath with exertion  and increase in lower extremity edema.  Patient reporting a decrease in activity tolerance related to shortness of breath.  Reports weighing herself daily.  Weight this morning was 176.5 pounds (baseline usually 173 but last week weight was 177 pounds).  States she has increased Lasix as instructed by provider and feels decrease in lower extremity edema today.  Reports she does wear support hose and tries to keep lower extremities elevated.  Patient does report her cardiologist wanting to do TEE to look at mitral valve, but she was reluctant to have procedure due to pandemic.  Acknowledges history of atrial fibrillation and states she can tell when her heart is racing.  States she feels her heart beating fast several times a day, where she has to be still and rest to get rates down. Unable to perform ADLs independently Unable to perform IADLs independently Financial strain-patient reporting her Eliquis copay has increased to $100 monthly.   Nurse Case Manager Clinical Goal(s):  Patient will report no hospitalization related to heart failure exacerbation within the next 90 days. patient will weigh self daily and record patient will verbalize understanding of Heart Failure Action Plan and when to call doctor patient will take all Heart Failure mediations as prescribed Patient will continue to monitor heart rates and notify provider if elevated heart rates sustains Interventions:  Collaboration with Crecencio Mc, MD regarding development and update of comprehensive plan of  care as evidenced by provider attestation and co-signature Inter-disciplinary care team collaboration (see longitudinal plan of care) Basic overview and discussion of pathophysiology of Heart Failure Reviewed Heart Failure Action Plan in depth and provided written copy Assessed for scales in home and encouraged continued daily weight monitoring, documenting in log for provider review Discussed importance of daily weight and when to  call provider based on weight Reviewed medications and medication changes and encouraged medication compliance Reviewed role of diuretics in prevention of fluid overload and recent change in direction of taking prescribed Lasix (reports taking the increase dose of 40 mg every other day alternating with 20 mg) Discussed increased heart rates and atrial fibrillation and encouraged patient to continue to monitor feelings of rapid rates and to notify provider if rates sustains Reviewed and discussed upcoming scheduled appointments, Cardiology 10/10/20 and discussed contacting Cardiology to request sooner appointment with current symptoms and increase in weakness and orders for physical therapy (offered to assist patient with rescheduling cardiac appointment, patient declines and stated she would have daughter contact Cardiology to discuss rescheduling appointment sooner) Depression screen reviewed Self-awareness of signs/symptoms of worsening disease encouraged Sending heart failure zones and action plan education; sending EMMI Video for Atrial Fibrillation and Heart Failure rest and exercise Sending 2022 Calendar Booklet to help with documentation of appointments and vital signs Encouraged elevation of lower extremities and continued use of support hose Discussed increase in price of Eliquis ($100 per month) and offered referral for CCM Pharmacist for medication assistance (patient declines stating she thinks Cardiology office is assisting with this process at this time, encouraged to let this RNCM know if she needs help in the future with cost of medications) Patient Goals/Self-Care Activities:   Call office if I gain more than 2 pounds in one day or 5 pounds in one week Keep legs up while sitting and wear support hose Watch for swelling in feet, ankles and legs every day Weigh myself daily and keep in log to take to provider for review Develop a rescue plan and know when to call the doctor Contact  heart doctor to see if April appointment can be moved to sooner/discuss shortness of breath and weakness and therapy with heart doctor Follow Up Plan: The care management team will reach out to the patient again over the next 14 business days.      Care Plan : Increase in weakness limiting patient independence  Updates made by Leona Singleton, RN since 08/25/2020 12:00 AM   Problem: Functional Decline   Priority: Medium   Long-Range Goal: Functional Decline Minimized over the next 90 days   Start Date: 08/25/2020  Expected End Date: 02/15/2021  Priority: Medium  Current Barriers:  Knowledge Deficits related to increasing strength in patient with declining functional ability as evidenced by patient reporting she has little energy and shortness of breath with minimal activity.  Patient reporting needing assistance with some ADLs at times of low energy and shortness of breath (like putting on socks, shoes and support hose).  Has hired home care aide 3 hours a day, 5 days a week to help with task, especially cooking and cleaning.  Denies any falls in the last year. Does report now using a cane with most ambulation and getting in and out of vehicle.  Unable to perform ADLs independently Unable to perform IADLs independently Care Coordination needs related to home health physical therapy  in a patient with decreasing strength Clinical Goal(s):  patient will demonstrate improved adherence to prescribed  treatment plan for decreasing falls as evidenced by patient reporting and review of EMR patient will verbalize using fall risk reduction strategies discussed patient will not experience additional falls patient will verbalize understanding of plan for safety and increasing strength Interventions:  Collaboration with Crecencio Mc, MD regarding development and update of comprehensive plan of care as evidenced by provider attestation and co-signature Inter-disciplinary care team collaboration (see  longitudinal plan of care) Provided written and verbal education re: Potential causes of falls and Fall prevention strategies Reviewed medications and discussed potential side effects of medications such as dizziness and frequent urination Assessed patients knowledge of fall risk prevention and discussed increasing activity slowly as tolerated Discussed orders for home health physical therapy and encouraged patient to participate with therapy (also discussed verifying if ok to participate with Cardiology) Discussed possible medical reasons for decrease in strength (heart failure, mitral valve problems) Encouraged patient to continue to monitor blood pressures daily monitoring for hypotension and prevention of falls Maximum independence promoted as safely as can providing self care and seeking assistance from home aide as necessary Energy conservation techniques promoted Discussed with patient to prepare for rehabilitation services to develop an individualized activity and exercise program as indicated, such as progressive resistance strengthening, balance and activity tolerance training or home exercise program Promoted gradual increase in intensity of activity and exercise as tolerated, such as duration, frequency, exercise repetition and sets and intensity Patient Goals/Self-Care Deficits:   Work with home health physical therapist Do the exercise that the therapist taught me Set a daily activity goal Use cane with walking, fall precautions, change positions slowly Start slowly and increase the amount of time for working out as tolerated   Follow Up Plan: The care management team will reach out to the patient again over the next 14 business days.      Plan:The care management team will reach out to the patient again over the next 14 business days.  [name] RN, MSN RN Care Management Coordinator [place] [phone] [email or web address]  Encounter details: CCM Time Spent       Value Time User   Time spent with patient (minutes)  150 08/25/2020  6:12 PM Shelby Mattocks Illene Regulus, RN   Time spent performing Chart review  10 08/25/2020  6:12 PM Leona Singleton, RN   Total time (minutes)  160 08/25/2020  6:12 PM Finnigan Warriner, Illene Regulus, RN      Moderate to High Complex Decision Making       Value Time User   Moderate to High complex decision making  Yes 08/25/2020  6:12 PM Neta Upadhyay, Illene Regulus, RN      CCM Services: complex  Prior to outreach and patient consent for Chronic Care Management, I referred this patient for services after reviewing the nominated patient list or from a personal encounter with the patient.  I have personally reviewed this encounter including the documentation in this note and have collaborated with the care management provider regarding care management and care coordination activities to include development and update of the comprehensive care plan. I am certifying that I agree with the content of this note and encounter as supervising physician.

## 2020-08-25 NOTE — Patient Instructions (Addendum)
Visit Information  It was a pleasure speaking with you today.  Please contact me if the home health agency does not contact you by the end of next week [facility], [address], [place], [place], [place] [address], Call us: [phone]).  Please call your heart doctor to try and reschedule you appointment for sooner date and time Thank Arlie Solomons [phone]   PATIENT GOALS:  Goals Addressed              This Visit's Progress   .  (RNCM) Increase My Muscle Strength        Timeframe:  Long-Range Goal Priority:  Medium Start Date:  08/25/2020                           Expected End Date: 02/15/21                      Follow Up Date 09/03/20    . Work with home health physical therapist . Lazaro Arms the exercise that the therapist taught me . Set a daily activity goal . Use cane with walking, fall precautions, change positions slowly . Start slowly and increase the amount of time for working out as tolerated    Why is this important?    Walking and easy exercises make you stronger. These also give you energy.   Every little bit helps, at any age.    Notes:    Marland Kitchen  (RNCM) Track and Manage Fluids and Swelling-Heart Failure        Timeframe:  Long-Range Goal Priority:  High Start Date:   08/25/20                          Expected End Date:   02/15/21                    Follow Up Date 09/03/20    . Call office if I gain more than 2 pounds in one day or 5 pounds in one week . Keep legs up while sitting and wear support hose . Watch for swelling in feet, ankles and legs every day . Weigh myself daily and keep in log to take to provider for review . Develop a rescue plan and know when to call the doctor . Contact heart doctor to see if April appointment can be moved to sooner/discuss shortness of breath and weakness and therapy with heart doctor  Why is this important?    It is important to check your weight daily and watch how much salt and liquids you have.   It  will help you to manage your heart failure.    Notes:     Consent to CCM Services: Ms. Rossbach was given information about Chronic Care Management services today including:  1. CCM service includes personalized support from designated clinical staff supervised by her physician, including individualized plan of care and coordination with other care providers 2. 24/7 contact phone numbers for assistance for urgent and routine care needs. 3. Service will only be billed when office clinical staff spend 20 minutes or more in a month to coordinate care. 4. Only one practitioner may furnish and bill the service in a calendar month. 5. The patient may stop CCM services at any time (effective at the end of the month) by phone call to the office staff. 6. The patient  will be responsible for cost sharing (co-pay) of up to 20% of the service fee (after annual deductible is met).  Patient agreed to services and verbal consent obtained.   Patient verbalizes understanding of instructions provided today and agrees to view in [facility].   The care management team will reach out to the patient again over the next 14 business days.   [name] RN, MSN RN Care Management Coordinator [place] [facility] [phone] [email or web address]   CLINICAL CARE PLAN: Patient Care Plan: Heart Failure  Problem Identified: Heart Failure (increasing edema and shortness of breath   Priority: High  Long-Range Goal: Symptom Exacerbation Prevented or Minimized   Start Date: 08/25/2020  Expected End Date: 02/15/2021  Priority: High  Current Barriers:  Marland Kitchen Knowledge deficits related to basic heart failure pathophysiology and self care management as evidenced by increasing shortness of breath with exertion and increase in lower extremity edema.  Patient reporting a decrease in activity tolerance related to shortness of breath.  Reports weighing herself daily.  Weight this morning was 176.5 pounds  (baseline usually 173 but last week weight was 177 pounds).  States she has increased Lasix as instructed by provider and feels decrease in lower extremity edema today.  Reports she does wear support hose and tries to keep lower extremities elevated.  Patient does report her cardiologist wanting to do TEE to look at mitral valve, but she was reluctant to have procedure due to pandemic.  Acknowledges history of atrial fibrillation and states she can tell when her heart is racing.  States she feels her heart beating fast several times a day, where she has to be still and rest to get rates down. . Unable to perform ADLs independently . Unable to perform IADLs independently . Financial strain-patient reporting her Eliquis copay has increased to $100 monthly.   Nurse Case Manager Clinical Goal(s):   Patient will report no hospitalization related to heart failure exacerbation within the next 90 days.  patient will weigh self daily and record  patient will verbalize understanding of Heart Failure Action Plan and when to call doctor  patient will take all Heart Failure mediations as prescribed  Patient will continue to monitor heart rates and notify provider if elevated heart rates sustains Interventions:  . Collaboration with Crecencio Mc, MD regarding development and update of comprehensive plan of care as evidenced by provider attestation and co-signature . Inter-disciplinary care team collaboration (see longitudinal plan of care) . Basic overview and discussion of pathophysiology of Heart Failure . Reviewed Heart Failure Action Plan in depth and provided written copy . Assessed for scales in home and encouraged continued daily weight monitoring, documenting in log for provider review . Discussed importance of daily weight and when to call provider based on weight . Reviewed medications and medication changes and encouraged medication compliance . Reviewed role of diuretics in prevention of  fluid overload and recent change in direction of taking prescribed Lasix (reports taking the increase dose of 40 mg every other day alternating with 20 mg) . Discussed increased heart rates and atrial fibrillation and encouraged patient to continue to monitor feelings of rapid rates and to notify provider if rates sustains . Reviewed and discussed upcoming scheduled appointments, Cardiology 10/10/20 and discussed contacting Cardiology to request sooner appointment with current symptoms and increase in weakness and orders for physical therapy (offered to assist patient with rescheduling cardiac appointment, patient declines and stated she would have daughter contact Cardiology to discuss rescheduling appointment sooner) . Depression  screen reviewed . Self-awareness of signs/symptoms of worsening disease encouraged . Sending heart failure zones and action plan education . Sending 2022 Calendar Booklet to help with documentation of appointments and vital signs . Encouraged elevation of lower extremities and continued use of support hose . Discussed increase in price of Eliquis ($100 per month) and offered referral for CCM Pharmacist for medication assistance (patient declines stating she thinks Cardiology office is assisting with this process at this time, encouraged to let this RNCM know if she needs help in the future with cost of medications) Patient Goals/Self-Care Activities:  .  Call office if I gain more than 2 pounds in one day or 5 pounds in one week . Keep legs up while sitting and wear support hose . Watch for swelling in feet, ankles and legs every day . Weigh myself daily and keep in log to take to provider for review . Develop a rescue plan and know when to call the doctor . Contact heart doctor to see if April appointment can be moved to sooner/discuss shortness of breath and weakness and therapy with heart doctor Follow Up Plan: The care management team will reach out to the patient again  over the next 14 business days.     Patient Care Plan: Increase in weakness limiting patient independence  Problem Identified: Functional Decline   Priority: Medium  Long-Range Goal: Functional Decline Minimized over the next 90 days   Start Date: 08/25/2020  Expected End Date: 02/15/2021  Priority: Medium  Current Barriers:  Marland Kitchen Knowledge Deficits related to increasing strength in patient with declining functional ability as evidenced by patient reporting she has little energy and shortness of breath with minimal activity.  Patient reporting needing assistance with some ADLs at times of low energy and shortness of breath (like putting on socks, shoes and support hose).  Has hired home care aide 3 hours a day, 5 days a week to help with task, especially cooking and cleaning.  Denies any falls in the last year. Does report now using a cane with most ambulation and getting in and out of vehicle.  . Unable to perform ADLs independently . Unable to perform IADLs independently . Care Coordination needs related to home health physical therapy  in a patient with decreasing strength Clinical Goal(s):  . patient will demonstrate improved adherence to prescribed treatment plan for decreasing falls as evidenced by patient reporting and review of EMR . patient will verbalize using fall risk reduction strategies discussed . patient will not experience additional falls . patient will verbalize understanding of plan for safety and increasing strength Interventions:  . Collaboration with Crecencio Mc, MD regarding development and update of comprehensive plan of care as evidenced by provider attestation and co-signature . Inter-disciplinary care team collaboration (see longitudinal plan of care) . Provided written and verbal education re: Potential causes of falls and Fall prevention strategies . Reviewed medications and discussed potential side effects of medications such as dizziness and frequent  urination . Assessed patients knowledge of fall risk prevention and discussed increasing activity slowly as tolerated . Discussed orders for home health physical therapy and encouraged patient to participate with therapy (also discussed verifying if ok to participate with Cardiology) . Discussed possible medical reasons for decrease in strength (heart failure, mitral valve problems) . Encouraged patient to continue to monitor blood pressures daily monitoring for hypotension and prevention of falls . Maximum independence promoted as safely as can providing self care and seeking assistance from home aide  as necessary . Energy conservation techniques promoted . Discussed with patient to prepare for rehabilitation services to develop an individualized activity and exercise program as indicated, such as progressive resistance strengthening, balance and activity tolerance training or home exercise program . Promoted gradual increase in intensity of activity and exercise as tolerated, such as duration, frequency, exercise repetition and sets and intensity Patient Goals/Self-Care Deficits:   . Work with home health physical therapist . Lazaro Arms the exercise that the therapist taught me . Set a daily activity goal . Use cane with walking, fall precautions, change positions slowly . Start slowly and increase the amount of time for working out as tolerated   Follow Up Plan: The care management team will reach out to the patient again over the next 14 business days.       Heart Failure Action Plan A heart failure action plan helps you understand what to do when you have symptoms of heart failure. Your action plan is a color-coded plan that lists the symptoms to watch for and indicates what actions to take.  If you have symptoms in the red zone, you need medical care right away.  If you have symptoms in the yellow zone, you are having problems.  If you have symptoms in the green zone, you are doing  well. Follow the plan that was created by you and your health care provider. Review your plan each time you visit your health care provider. Red zone These signs and symptoms mean you should get medical help right away:  You have trouble breathing when resting.  You have a dry cough that is getting worse.  You have swelling or pain in your legs or abdomen that is getting worse.  You suddenly gain more than 2-3 lb (0.9-1.4 kg) in 24 hours, or more than 5 lb (2.3 kg) in a week. This amount may be more or less depending on your condition.  You have trouble staying awake or you feel confused.  You have chest pain.  You do not have an appetite.  You pass out.  You have worsening sadness or depression. If you have any of these symptoms, call your local emergency services (911 in the [place] right away. Do not drive yourself to the hospital.   Yellow zone These signs and symptoms mean your condition may be getting worse and you should make some changes:  You have trouble breathing when you are active, or you need to sleep with your head raised on extra pillows to help you breathe.  You have swelling in your legs or abdomen.  You gain 2-3 lb (0.9-1.4 kg) in 24 hours, or 5 lb (2.3 kg) in a week. This amount may be more or less depending on your condition.  You get tired easily.  You have trouble sleeping.  You have a dry cough. If you have any of these symptoms:  Contact your health care provider within the next day.  Your health care provider may adjust your medicines.   Green zone These signs mean you are doing well and can continue what you are doing:  You do not have shortness of breath.  You have very little swelling or no new swelling.  Your weight is stable (no gain or loss).  You have a normal activity level.  You do not have chest pain or any other new symptoms.   Follow these instructions at home:  Take over-the-counter and prescription medicines only as told by  your health care provider.  Weigh yourself daily. Your target weight is __________ lb (__________ kg). ? Call your health care provider if you gain more than __________ lb (__________ kg) in 24 hours, or more than __________ lb (__________ kg) in a week. ? Health care provider name: _____________________________________________________ ? Health care provider phone number: _____________________________________________________  Eat a heart-healthy diet. Work with a diet and nutrition specialist (dietitian) to create an eating plan that is best for you.  Keep all follow-up visits. This is important. Where to find more information  American Heart Association: www.heart.org Summary  A heart failure action plan helps you understand what to do when you have symptoms of heart failure.  Follow the action plan that was created by you and your health care provider.  Get help right away if you have any symptoms in the red zone. This information is not intended to replace advice given to you by your health care provider. Make sure you discuss any questions you have with your health care provider. Document Revised: 01/19/2020 Document Reviewed: 01/19/2020 Elsevier Patient Education  2021 [employer].

## 2020-08-31 ENCOUNTER — Other Ambulatory Visit
Admission: RE | Admit: 2020-08-31 | Discharge: 2020-08-31 | Disposition: A | Discharge status: Home / Self Care | Payer: Medicare Other | Source: Ambulatory Visit | Attending: Cardiology | Admitting: Cardiology

## 2020-08-31 DIAGNOSIS — I428 Other cardiomyopathies: Secondary | ICD-10-CM | POA: Insufficient documentation

## 2020-08-31 DIAGNOSIS — I4891 Unspecified atrial fibrillation: Secondary | ICD-10-CM | POA: Insufficient documentation

## 2020-08-31 LAB — BRAIN NATRIURETIC PEPTIDE: B Natriuretic Peptide: 602.4 pg/mL — ABNORMAL HIGH (ref 0.0–100.0)

## 2020-09-03 ENCOUNTER — Ambulatory Visit: Payer: Medicare Other | Admitting: *Deleted

## 2020-09-03 DIAGNOSIS — I4811 Longstanding persistent atrial fibrillation: Secondary | ICD-10-CM

## 2020-09-03 DIAGNOSIS — R600 Localized edema: Secondary | ICD-10-CM

## 2020-09-03 DIAGNOSIS — R5383 Other fatigue: Secondary | ICD-10-CM

## 2020-09-03 DIAGNOSIS — M8580 Other specified disorders of bone density and structure, unspecified site: Secondary | ICD-10-CM

## 2020-09-03 DIAGNOSIS — M6281 Muscle weakness (generalized): Secondary | ICD-10-CM

## 2020-09-03 DIAGNOSIS — I517 Cardiomegaly: Secondary | ICD-10-CM

## 2020-09-03 NOTE — Chronic Care Management (AMB) (Signed)
Chronic Care Management   CCM RN Visit Note  09/03/2020 Name: Linda [name] MRN: [id number] DOB: 1934-05-04  Subjective: Linda [name] is a 87 y.o. year old female who is a primary care patient of Linda Mc, MD. The care management team was consulted for assistance with disease management and care coordination needs.    Engaged with patient by telephone for follow up visit in response to Linda [name] referral for case management and/or care coordination services.   Consent to Services:  The patient was given information about Chronic Care Management services, agreed to services, and gave verbal consent prior to initiation of services.  Please see initial visit note for detailed documentation.   Patient agreed to services and verbal consent obtained.   Assessment: Review of patient past medical history, allergies, medications, health status, including review of consultants reports, laboratory and other test data, was performed as part of comprehensive evaluation and provision of chronic care management services.   SDOH (Social Determinants of Health) assessments and interventions performed:    CCM Care Plan  Allergies  Allergen Reactions  . Barium Iodide     Other reaction(s): Unknown  . Crestor [Rosuvastatin Calcium] Other (See Comments)    Myalgia   . Penicillins Rash  . Sulfa Antibiotics Rash  . Sulfonamide Derivatives Rash    Outpatient Encounter Medications as of 09/03/2020  Medication Sig Note  . furosemide (LASIX) 20 MG tablet Take 20 mg by mouth daily. 09/03/2020: Reports taking 40 mg daily  until 09/16/20 per Cardiology instructions  . cetirizine (ZYRTEC) 10 MG tablet Take 10 mg by mouth daily as needed for allergies.   . Cholecalciferol (VITAMIN D) 2000 units tablet Take 2,000 Units by mouth daily.   Marland Kitchen ELIQUIS 5 MG TABS tablet Take 1 tablet (5 mg total) by mouth 2 (two) times daily.   Marland Kitchen estradiol (ESTRACE) 0.1 MG/GM vaginal cream Place 1 g vaginally 3 (three) times a  week. Pea sized amount per urethra   . LORazepam (ATIVAN) 0.5 MG tablet Take 1 tablet (0.5 mg total) by mouth at bedtime as needed for anxiety.   Marland Kitchen losartan (COZAAR) 25 MG tablet Take 1 tablet (25 mg total) by mouth daily. 08/25/2020: Reports taking 1/2 tablet nightly  . pantoprazole (PROTONIX) 40 MG tablet Take 40 mg by mouth 2 (two) times daily. (Patient not taking: Reported on 08/25/2020) 08/25/2020: Reports not taking at this time  . potassium chloride (KLOR-CON) 10 MEQ tablet Take 10 mEq by mouth daily.   . traZODone (DESYREL) 50 MG tablet     No facility-administered encounter medications on file as of 09/03/2020.    Patient Active Problem List   Diagnosis Date Noted  . Proximal muscle weakness 08/24/2020  . Mitral valve disease 07/09/2020  . Fatigue 05/29/2020  . Hypokalemia 12/16/2019  . Myalgia due to statin 12/15/2019  . Acquired thrombophilia ([place]) 12/15/2019  . Skin lesion of hand 08/27/2018  . Hospital discharge follow-up 05/25/2018  . History of CVA (cerebrovascular accident) without residual deficits 05/10/2018  . Parent-child estrangement Linda [name] 02/09/2018  . Basal cell carcinoma (BCC) of right lower extremity 08/08/2017  . Hematuria, gross 08/08/2017  . Extremity atherosclerosis with intermittent claudication ([place]) 08/08/2017  . Generalized anxiety disorder 01/31/2017  . 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 Controlled type 2 diabetes mellitus with microalbuminuria, without long-term current use of insulin ([place]) 01/20/2016  . Insomnia secondary to anxiety 10/09/2015  . Cardiomyopathy, idiopathic ([place]) 09/03/2015  . Polycythemia, secondary 07/04/2015  . Pre-syncope 07/02/2015  .  Dysuria 12/28/2014  . Osteopenia 08/21/2014  . Gastritis 05/26/2014  . GERD (gastroesophageal reflux disease) 03/04/2014  . Medicare annual wellness visit, subsequent 01/13/2014  . Essential hypertension 11/04/2013  . Screening for breast cancer 07/01/2013  . Left shoulder pain 03/21/2013  .  Hoarseness of voice 03/21/2013  . Edema 09/05/2010  . VENTRICULAR HYPERTROPHY, LEFT 08/16/2010  . Hyperlipidemia 06/03/2010  . ATRIAL FIBRILLATION 08/13/2009    Conditions to be addressed/monitored:Atrial Fibrillation, CHF and Weakness  Care Plan : Heart Failure  Updates made by Linda Singleton, RN since 09/03/2020 12:00 AM  Problem: Heart Failure (increasing edema and shortness of breath   Priority: High  Long-Range Goal: Symptom Exacerbation Prevented or Minimized   Start Date: 08/25/2020  Expected End Date: 02/15/2021  This Visit's Progress: Not on track  Priority: High  Current Barriers:  Marland Kitchen Knowledge deficits related to basic heart failure pathophysiology and self care management as evidenced by increasing shortness of breath with exertion and increase in lower extremity edema.  Endorses she continues to have shortness of breath and swelling in her lower extremities.  States she has seen her Linda [name] and he increased he Lasix to 2 tablets (40 mg) daily until 09/16/20, when she follows up.  States she can tell she is voiding more with the increase dose of lasix, but swelling is the same, some days better than others (no specific pattern).  Continues to weigh daily and weight this morning was 176 pounds (near what she has been weighing).  States her cardiologist has plans for more testing and possible referral to Linda [name] for valve treatment. . Unable to perform ADLs independently . Unable to perform IADLs independently . Financial strain-patient reporting her Eliquis copay has increased to $100 monthly.   Nurse Case Manager Clinical Goal(s):   Patient will report no hospitalization related to heart failure exacerbation within the next 90 days.  patient will weigh self daily and record  patient will verbalize understanding of Heart Failure Action Plan and when to call doctor  patient will take all Heart Failure mediations as prescribed  Patient will continue to monitor heart rates and  notify Linda [name] if elevated heart rates sustains Interventions:  . Collaboration with Linda Mc, MD regarding development and update of comprehensive plan of care as evidenced by Kijana Estock attestation and co-signature . Inter-disciplinary care team collaboration (see longitudinal plan of care) . Basic overview and discussion of pathophysiology of Heart Failure . Reviewed Heart Failure Action Plan in depth and provided written copy; encouraged patient to review . Encouraged continued daily weight monitoring, documenting in log for Lasondra Hodgkins review . Reviewed medications and medication changes and encouraged medication compliance . Reviewed role of diuretics in prevention of fluid overload and recent change in dosage of Lasix (patient verbalized her understanding and states taking 2 tablets daily) . Discussed increased heart rates and atrial fibrillation and encouraged patient to continue to monitor feelings of rapid rates and to notify Mar[facility] Ridge if rates sustains . Reviewed and discussed upcoming scheduled appointments, Cardiology 09/16/20 Depression screen reviewed . Self-awareness of signs/symptoms of worsening disease encouraged . Sending heart failure zones and action plan education; sending EMMI Video for Atrial Fibrillation and Heart Failure rest and exercise . Confirmed patient received 2022 Calendar Booklet and encouraged patient to use for documentation of appointments and vital signs . Encouraged elevation of lower extremities and continued use of support hose Patient Goals/Self-Care Activities:  . Call office if I gain more than 2 pounds in one day or 5 pounds  in one week . Keep legs up while sitting and wear support hose . Watch for swelling in feet, ankles and legs every day . Weigh myself daily and keep in log to take to Samentha Perham for review . Develop a rescue plan and know when to call the doctor Follow Up Plan: The care management team will reach out to the patient again over the  next 14 business days.     Care Plan : Increase in weakness limiting patient independence  Updates made by Linda Singleton, RN since 09/03/2020 12:00 AM  Problem: Functional Decline   Priority: Medium  Long-Range Goal: Functional Decline Minimized over the next 90 days   Start Date: 08/25/2020  Expected End Date: 02/15/2021  This Visit's Progress: Not on track  Priority: Medium  Current Barriers:  Marland Kitchen Knowledge Deficits related to increasing strength in patient with declining functional ability as evidenced by patient reporting she has little energy and shortness of breath with minimal activity.  Patient reporting continued weakness.  States home health therapy has not started yet.  Reports they called this morning to arrange initial home visit and patient requested call back, stating she wanted to discuss with her daughter.  Patient reporting she is a little apprehensive with working with therapy for fear of not being able to do it.  States she has spoken with daughter who is in agree ance with patient participating with home health therapy.  RNCM reassured patient therapist would not push her to do anything uncomfortable and would better give her energy conservation strategies.  Provided patient with number to home health agency to return call and arrange time and date for home visit. . Unable to perform ADLs independently . Unable to perform IADLs independently . Care Coordination needs related to home health physical therapy  in a patient with decreasing strength Clinical Goal(s):  . patient will demonstrate improved adherence to prescribed treatment plan for decreasing falls as evidenced by patient reporting and review of EMR . patient will verbalize using fall risk reduction strategies discussed . patient will not experience additional falls . patient will verbalize understanding of plan for safety and increasing strength Interventions:  . Collaboration with Linda Mc, MD regarding  development and update of comprehensive plan of care as evidenced by Adiel Mcnamara attestation and co-signature . Inter-disciplinary care team collaboration (see longitudinal plan of care) . Discussed fall precaution and Fall prevention strategies . Reviewed medications and discussed potential side effects of medications such as dizziness and frequent urination . Assessed patients knowledge of fall risk prevention and discussed increasing activity slowly as tolerated . Discussed orders for home health physical therapy and encouraged patient to participate with therapy; acknowledged her feelings of apprehension and reassured her physical therapist would be there to help her and give tips . Discussed possible medical reasons for decrease in strength (heart failure, mitral valve problems) . Encouraged patient to continue to monitor blood pressures daily monitoring for hypotension and prevention of falls . Maximum independence promoted as safely as can providing self care and seeking assistance from home aide as necessary; instructed do not push herself to do things that feel unsafe or that she is not used to doing alone . Allowed patient to verbalize her feelings of frustration with change of activity level she was previously used to and offered support . Energy conservation techniques promoted . Discussed with patient to prepare for rehabilitation services to develop an individualized activity and exercise program as indicated, such as progressive resistance strengthening,  balance and activity tolerance training or home exercise program . Promoted gradual increase in intensity of activity and exercise as tolerated, such as duration, frequency, exercise repetition and sets and intensity Patient Goals/Self-Care Deficits:   . Work with home health physical therapist (call them if they do not call you back today to arrange initial appointment  [phone]) . Do the exercise that the therapist taught me . Set a  daily activity goal . Use cane with walking, fall precautions, change positions slowly . Start slowly and increase the amount of time for working out as tolerated  . Do not do any activity that does not feel safe or that you normally would not do on your own (wait for some assistance from family or home aid) Follow Up Plan: The care management team will reach out to the patient again over the next 14 business days.      Plan:The care management team will reach out to the patient again over the next 14 business days.  [name] RN, MSN RN Care Management Coordinator [place] [phone] [email or web address]

## 2020-09-03 NOTE — Patient Instructions (Signed)
Visit Information  PATIENT GOALS: Goals Addressed            This Visit's Progress   . (RNCM) Increase My Muscle Strength   Not on track    Timeframe:  Long-Range Goal Priority:  Medium Start Date:  08/25/2020                           Expected End Date: 02/15/21                      Follow Up Date 09/20/20    . Work with home health physical therapist (call them if they do not call you back today to arrange initial appointment  [phone]) . Do the exercise that the therapist taught me . Set a daily activity goal . Use cane with walking, fall precautions, change positions slowly . Start slowly and increase the amount of time for working out as tolerated  . Do not do any activity that does not feel safe or that you normally would not do on your own (wait for some assistance from family or home aid)   Why is this important?    Walking and easy exercises make you stronger. These also give you energy.   Every little bit helps, at any age.    Notes:     Marland Kitchen (RNCM) Track and Manage Fluids and Swelling-Heart Failure   On track    Timeframe:  Long-Range Goal Priority:  High Start Date:   08/25/20                          Expected End Date:   02/15/21                    Follow Up Date 09/20/20    . Call office if I gain more than 2 pounds in one day or 5 pounds in one week . Keep legs up while sitting and wear support hose . Watch for swelling in feet, ankles and legs every day . Weigh myself daily and keep in log to take to provider for review . Develop a rescue plan and know when to call the doctor   Why is this important?    It is important to check your weight daily and watch how much salt and liquids you have.   It will help you to manage your heart failure.    Notes:      Patient verbalizes understanding of instructions provided today and agrees to view in [place].   The care management team will reach out to the patient again over the next 14 business days.   [name] RN, MSN RN Care Management Coordinator [place] [phone] [email or web address]

## 2020-09-09 ENCOUNTER — Other Ambulatory Visit: Payer: Self-pay

## 2020-09-09 ENCOUNTER — Other Ambulatory Visit (INDEPENDENT_AMBULATORY_CARE_PROVIDER_SITE_OTHER): Payer: Medicare Other

## 2020-09-09 DIAGNOSIS — R809 Proteinuria, unspecified: Secondary | ICD-10-CM | POA: Diagnosis not present

## 2020-09-09 DIAGNOSIS — E876 Hypokalemia: Secondary | ICD-10-CM

## 2020-09-09 DIAGNOSIS — E1129 Type 2 diabetes mellitus with other diabetic kidney complication: Secondary | ICD-10-CM | POA: Diagnosis not present

## 2020-09-09 DIAGNOSIS — R5383 Other fatigue: Secondary | ICD-10-CM

## 2020-09-09 LAB — LIPID PANEL
Cholesterol: 145 mg/dL (ref 0–200)
HDL: 53.7 mg/dL (ref >39.00)
LDL Cholesterol: 79 mg/dL (ref 0–99)
NonHDL: 91.38
Total CHOL/HDL Ratio: 3
Triglycerides: 63 mg/dL (ref 0.0–149.0)
VLDL: 12.6 mg/dL (ref 0.0–40.0)

## 2020-09-09 LAB — HEMOGLOBIN A1C: Hgb A1c MFr Bld: 7.2 % — ABNORMAL HIGH (ref 4.6–6.5)

## 2020-09-09 LAB — COMPREHENSIVE METABOLIC PANEL
ALT: 22 U/L (ref 0–35)
AST: 26 U/L (ref 0–37)
Albumin: 4 g/dL (ref 3.5–5.2)
Alkaline Phosphatase: 74 U/L (ref 39–117)
BUN: 22 mg/dL (ref 6–23)
CO2: 35 mEq/L — ABNORMAL HIGH (ref 19–32)
Calcium: 9.5 mg/dL (ref 8.4–10.5)
Chloride: 99 mEq/L (ref 96–112)
Creatinine, Ser: 0.85 mg/dL (ref 0.40–1.20)
GFR: 62.73 mL/min (ref >60.00)
Glucose, Bld: 160 mg/dL — ABNORMAL HIGH (ref 70–99)
Potassium: 3.3 mEq/L — ABNORMAL LOW (ref 3.5–5.1)
Sodium: 142 mEq/L (ref 135–145)
Total Bilirubin: 2.6 mg/dL — ABNORMAL HIGH (ref 0.2–1.2)
Total Protein: 6.3 g/dL (ref 6.0–8.3)

## 2020-09-09 NOTE — Addendum Note (Signed)
Addended by: Leeanne Rio on: 09/09/2020 08:55 AM   Modules accepted: Orders

## 2020-09-10 ENCOUNTER — Other Ambulatory Visit: Payer: Medicare Other

## 2020-09-10 DIAGNOSIS — R809 Proteinuria, unspecified: Secondary | ICD-10-CM

## 2020-09-10 DIAGNOSIS — E1129 Type 2 diabetes mellitus with other diabetic kidney complication: Secondary | ICD-10-CM

## 2020-09-10 LAB — IRON,TIBC AND FERRITIN PANEL
%SAT: 26 % (calc) (ref 16–45)
Ferritin: 52 ng/mL (ref 16–288)
Iron: 116 ug/dL (ref 45–160)
TIBC: 447 mcg/dL (calc) (ref 250–450)

## 2020-09-10 LAB — MICROALBUMIN / CREATININE URINE RATIO
Creatinine,U: 23.9 mg/dL
Microalb Creat Ratio: 20.5 mg/g (ref 0.0–30.0)
Microalb, Ur: 4.9 mg/dL — ABNORMAL HIGH (ref 0.0–1.9)

## 2020-09-20 ENCOUNTER — Ambulatory Visit: Payer: Medicare Other | Admitting: *Deleted

## 2020-09-20 DIAGNOSIS — R600 Localized edema: Secondary | ICD-10-CM

## 2020-09-20 DIAGNOSIS — R531 Weakness: Secondary | ICD-10-CM

## 2020-09-20 DIAGNOSIS — M6281 Muscle weakness (generalized): Secondary | ICD-10-CM

## 2020-09-20 DIAGNOSIS — M8580 Other specified disorders of bone density and structure, unspecified site: Secondary | ICD-10-CM

## 2020-09-20 DIAGNOSIS — R5383 Other fatigue: Secondary | ICD-10-CM

## 2020-09-20 DIAGNOSIS — I4811 Longstanding persistent atrial fibrillation: Secondary | ICD-10-CM

## 2020-09-20 NOTE — Chronic Care Management (AMB) (Signed)
  Chronic Care Management   Outreach Note  09/20/2020 Name: Linda [name] MRN: [id number] DOB: 10/28/1933  Referred by: Crecencio Mc, MD Reason for referral : Chronic Care Management (CHF, Afib, Weakness)   Successful contact was made with patient for chronic care management follow up.  HIPAA verified with patient.  At beginning of conversation, patient's home health physical therapist arrived for treatment.  Patient requesting call back another day and time.  Follow Up Plan: The care management team will reach out to the patient again over the next 10 business days per patient request.   [name] RN, MSN RN Care Management Coordinator [place] [phone] [email or web address]

## 2020-09-22 DIAGNOSIS — E1169 Type 2 diabetes mellitus with other specified complication: Secondary | ICD-10-CM

## 2020-09-22 DIAGNOSIS — D6859 Other primary thrombophilia: Secondary | ICD-10-CM

## 2020-09-22 DIAGNOSIS — E785 Hyperlipidemia, unspecified: Secondary | ICD-10-CM

## 2020-09-22 DIAGNOSIS — I110 Hypertensive heart disease with heart failure: Secondary | ICD-10-CM | POA: Diagnosis not present

## 2020-09-22 DIAGNOSIS — I482 Chronic atrial fibrillation, unspecified: Secondary | ICD-10-CM | POA: Diagnosis not present

## 2020-09-22 DIAGNOSIS — E1140 Type 2 diabetes mellitus with diabetic neuropathy, unspecified: Secondary | ICD-10-CM | POA: Diagnosis not present

## 2020-09-22 DIAGNOSIS — Z7901 Long term (current) use of anticoagulants: Secondary | ICD-10-CM

## 2020-09-22 DIAGNOSIS — I429 Cardiomyopathy, unspecified: Secondary | ICD-10-CM

## 2020-09-22 DIAGNOSIS — E669 Obesity, unspecified: Secondary | ICD-10-CM

## 2020-09-22 DIAGNOSIS — R809 Proteinuria, unspecified: Secondary | ICD-10-CM

## 2020-09-22 DIAGNOSIS — I509 Heart failure, unspecified: Secondary | ICD-10-CM | POA: Diagnosis not present

## 2020-09-27 ENCOUNTER — Other Ambulatory Visit: Payer: Self-pay

## 2020-09-27 ENCOUNTER — Other Ambulatory Visit
Admission: RE | Admit: 2020-09-27 | Discharge: 2020-09-27 | Disposition: A | Discharge status: Home / Self Care | Payer: Medicare Other | Source: Ambulatory Visit | Attending: Cardiology | Admitting: Cardiology

## 2020-09-27 ENCOUNTER — Other Ambulatory Visit: Payer: Self-pay | Admitting: Cardiology

## 2020-09-27 DIAGNOSIS — Z01812 Encounter for preprocedural laboratory examination: Secondary | ICD-10-CM | POA: Insufficient documentation

## 2020-09-27 DIAGNOSIS — Z20822 Contact with and (suspected) exposure to covid-19: Secondary | ICD-10-CM | POA: Diagnosis not present

## 2020-09-27 LAB — SARS CORONAVIRUS 2 (TAT 6-24 HRS): SARS Coronavirus 2: NEGATIVE

## 2020-09-29 ENCOUNTER — Encounter
Admission: RE | Disposition: A | Discharge status: Home / Self Care | Payer: Self-pay | Source: Home / Self Care | Attending: Cardiology

## 2020-09-29 ENCOUNTER — Ambulatory Visit: Payer: Medicare Other | Admitting: Anesthesiology

## 2020-09-29 ENCOUNTER — Telehealth: Payer: Self-pay | Admitting: *Deleted

## 2020-09-29 ENCOUNTER — Telehealth: Payer: Medicare Other

## 2020-09-29 ENCOUNTER — Ambulatory Visit
Admission: RE | Admit: 2020-09-29 | Discharge: 2020-09-29 | Disposition: A | Discharge status: Home / Self Care | Payer: Medicare Other | Source: Home / Self Care | Attending: Cardiology | Admitting: Cardiology

## 2020-09-29 ENCOUNTER — Other Ambulatory Visit: Payer: Self-pay

## 2020-09-29 ENCOUNTER — Ambulatory Visit
Admission: RE | Admit: 2020-09-29 | Discharge: 2020-09-29 | Disposition: A | Discharge status: Home / Self Care | Payer: Medicare Other | Attending: Cardiology | Admitting: Cardiology

## 2020-09-29 DIAGNOSIS — I482 Chronic atrial fibrillation, unspecified: Secondary | ICD-10-CM | POA: Diagnosis not present

## 2020-09-29 DIAGNOSIS — Z880 Allergy status to penicillin: Secondary | ICD-10-CM | POA: Insufficient documentation

## 2020-09-29 DIAGNOSIS — Z8673 Personal history of transient ischemic attack (TIA), and cerebral infarction without residual deficits: Secondary | ICD-10-CM | POA: Insufficient documentation

## 2020-09-29 DIAGNOSIS — Z888 Allergy status to other drugs, medicaments and biological substances status: Secondary | ICD-10-CM | POA: Diagnosis not present

## 2020-09-29 DIAGNOSIS — Z882 Allergy status to sulfonamides status: Secondary | ICD-10-CM | POA: Insufficient documentation

## 2020-09-29 DIAGNOSIS — F419 Anxiety disorder, unspecified: Secondary | ICD-10-CM | POA: Insufficient documentation

## 2020-09-29 DIAGNOSIS — I059 Rheumatic mitral valve disease, unspecified: Secondary | ICD-10-CM | POA: Diagnosis present

## 2020-09-29 DIAGNOSIS — I10 Essential (primary) hypertension: Secondary | ICD-10-CM | POA: Diagnosis not present

## 2020-09-29 DIAGNOSIS — I340 Nonrheumatic mitral (valve) insufficiency: Secondary | ICD-10-CM | POA: Insufficient documentation

## 2020-09-29 DIAGNOSIS — Z7901 Long term (current) use of anticoagulants: Secondary | ICD-10-CM | POA: Insufficient documentation

## 2020-09-29 DIAGNOSIS — I429 Cardiomyopathy, unspecified: Secondary | ICD-10-CM | POA: Diagnosis not present

## 2020-09-29 HISTORY — PX: TEE WITHOUT CARDIOVERSION: SHX5443

## 2020-09-29 LAB — GLUCOSE, CAPILLARY: Glucose-Capillary: 141 mg/dL — ABNORMAL HIGH (ref 70–99)

## 2020-09-29 SURGERY — ECHOCARDIOGRAM, TRANSESOPHAGEAL
Anesthesia: General

## 2020-09-29 MED ORDER — SODIUM CHLORIDE 0.9 % IV SOLN: INTRAVENOUS | Status: DC

## 2020-09-29 MED ORDER — PROPOFOL 10 MG/ML IV BOLUS
INTRAVENOUS | Status: DC | PRN
  Administered 2020-09-29 (×2): 30 mg via INTRAVENOUS
  Administered 2020-09-29 (×2): 20 mg via INTRAVENOUS

## 2020-09-29 MED ORDER — LIDOCAINE VISCOUS HCL 2 % MT SOLN
OROMUCOSAL | Status: AC
  Filled 2020-09-29: qty 15

## 2020-09-29 NOTE — H&P (Signed)
Chief Complaint: Chief Complaint  Patient presents with  . Follow-up  2weeks  Date of Service: 09/16/2020 Date of Birth: [birth year] PCP: Jannifer Hick, MD  History of Present Illness: Linda [name] is a 87 y.o.female patient who presents for a follow-up visit visit . She has noted more shortness of breath and peripheral edema. She is doing somewhat better although has a number of complaints including insomnia, feeling cold, shortness of breath, lack of appetite. She denies chest pain. She has a history of atrial fibrillation she is anticoagulated currently with Eliquis 5 mg twice daily. She has been evaluated noninvasively with an echo in 2019 which showed ejection fraction of 40 to 45% which is similar to her previous echocardiogram in 2018. There is mild to moderate MR and TR which again was unchanged. Holter showed that she gets somewhat bradycardic at night with some brief pauses at night but no significant bradycardia or tachyarrhythmias during the waking hours. Her echocardiogram which revealed more notable mitral valve regurgitation. Now returns to discuss this. Her symptoms are fairly stable. We discussed consideration for transesophageal echo to better evaluate the valve previously for further evaluation of the valve to consider intervention surgically or percutaneously. She had deferred initially but now would like to proceed. Will need to evaluate renal function as well as chest x-ray and proceed with consideration for TEE followed by further evaluation with regard to valve intervention. Attempted increase her losartan to 25 mg daily however remains her to hypotensive making her short of breath. Past Medical and Surgical History  Past Medical History Past Medical History:  Diagnosis Date  . Anxiety  . Arrhythmia 2005  . Atrial fibrillation (CMS-HCC)  . CHF (congestive heart failure) (CMS-HCC)  . CVA (cerebral vascular accident) (CMS-HCC)  . Diabetes mellitus type II, controlled  (CMS-HCC)  . GERD (gastroesophageal reflux disease) sometimes  Not often as I watch what I eat late in the day.  Marland Kitchen Hoarseness 05/20/2018  . Hyperlipidemia  . Hypertension  . Hypotension  . Osteoporosis   Past Surgical History She has a past surgical history that includes egd (08/21/2011); Colonoscopy ([id number], 05/27/2004, 08/11/1996); Oophorectomy; Appendectomy; Cholecystectomy; Sigmoidoscopy; Colonoscopy; Upper gastrointestinal endoscopy; and egd (12/17/2019).   Medications and Allergies  Current Medications  Current Outpatient Medications  Medication Sig Dispense Refill  . apixaban (ELIQUIS) 5 mg tablet Take 1 tablet (5 mg total) by mouth 2 (two) times daily 60 tablet 11  . cetirizine (ZYRTEC) 10 MG tablet Take by mouth  . chlorthalidone 25 MG tablet Take 25 mg by mouth once daily  . cholecalciferol (VITAMIN D3) 2,000 unit capsule Take 2,000 Units by mouth once daily (Patient not taking: Reported on 08/31/2020 )  . erythromycin [facility]) ophthalmic ointment APPLY A SMALL AMOUNT INTO BOTH EYES AT BEDTIME (Patient not taking: Reported on 08/31/2020)  . estradiol (ESTRACE) 0.01 % (0.1 mg/gram) vaginal cream Place 2 g vaginally once daily  . FUROsemide (LASIX) 20 MG tablet Take 1 tablet (20 mg total) by mouth as directed for Edema (Patient taking differently: Take 20 mg by mouth as directed for Edema Alternating 2 tablets one day and 1 tablet the next ) 30 tablet 11  . LORazepam (ATIVAN) 0.5 MG tablet Take 1 tablet (0.5 mg total) by mouth nightly as needed for Anxiety (Patient not taking: Reported on 08/31/2020 ) 30 tablet 3  . losartan (COZAAR) 25 MG tablet Take 0.5 tablets (12.5 mg total) by mouth once daily (Patient taking differently: Take 25 mg by mouth once daily )  30 tablet 11  . magnesium oxide (MAG-OX) 400 mg (241.3 mg magnesium) tablet Take 200 mg by mouth once daily (Patient not taking: Reported on 08/31/2020 )  . pantoprazole (PROTONIX) 40 MG DR tablet TAKE ONE TABLET BY MOUTH TWICE  A DAY, ONE 30 MINUTES BEFORE BREAKFAST AND ONE 30 MINUTES BEFORE DINNER (Patient not taking: Reported on 08/31/2020) 180 tablet 0  . potassium chloride (KLOR-CON) 10 MEQ ER tablet Take 1 tablet (10 mEq total) by mouth once daily (Patient not taking: Reported on 08/31/2020 ) 30 tablet 11  . traZODone (DESYREL) 50 MG tablet Take 1 tablet (50 mg total) by mouth nightly 30 tablet 11   No current facility-administered medications for this visit.   Allergies: Barium iodide, Penicillin, Sulfa (sulfonamide antibiotics), and Rosuvastatin calcium  Social and Family History  Social History reports that she has never smoked. She has never used smokeless tobacco. She reports that she does not drink alcohol and does not use drugs.  Family History Family History  Problem Relation Age of Onset  . Stroke Mother  deceased at age 56  . Coronary Artery Disease (Blocked arteries around heart) Brother  . Colon polyps Brother  . Coronary Artery Disease (Blocked arteries around heart) Brother  . Colon polyps Brother  deceased   Review of Systems  Review of Systems  Constitutional: Positive for malaise/fatigue. Negative for chills, diaphoresis, fever and weight loss.  HENT: Negative for congestion, ear discharge, hearing loss and tinnitus.  Eyes: Negative for blurred vision.  Respiratory: Negative for cough, hemoptysis, sputum production and wheezing.  Cardiovascular: Positive for palpitations and leg swelling. Negative for chest pain, orthopnea, claudication and PND.  Gastrointestinal: Positive for nausea. Negative for abdominal pain, blood in stool, constipation, diarrhea, heartburn and melena.  Genitourinary: Negative for dysuria, frequency, hematuria and urgency.  Musculoskeletal: Negative for back pain, falls, joint pain and myalgias.  Skin: Negative for itching and rash.  Neurological: Negative for tingling, focal weakness, loss of consciousness, weakness and headaches.  Endo/Heme/Allergies: Negative  for polydipsia. Does not bruise/bleed easily.  Psychiatric/Behavioral: Negative for depression, memory loss and substance abuse. The patient has insomnia. The patient is not nervous/anxious.   Physical Examination   Vitals: BP 108/72  Pulse (!) 43  Ht 160.7 cm (5' 3.25")  Wt 79.8 kg (176 lb)  LMP (LMP Unknown)  SpO2 91%  BMI 30.93 kg/m  Ht:160.7 cm (5' 3.25") Wt:79.8 kg (176 lb) FVC:BSWH surface area is 1.89 meters squared. Body mass index is 30.93 kg/m.  Wt Readings from Last 3 Encounters:  09/16/20 79.8 kg (176 lb)  08/31/20 80.7 kg (178 lb)  07/09/20 78.5 kg (173 lb)   BP Readings from Last 3 Encounters:  09/16/20 108/72  08/31/20 (!) 140/82  07/09/20 125/87   General appearance appears in no acute distress  Head Mouth and Eye exam Normocephalic, without obvious abnormality, atraumatic Dentition is good Eyes appear anicteric   LUNGS Breath Sounds: Normal Percussion: Normal  CARDIOVASCULAR JVP CV wave: no HJR: no Elevation at 90 degrees: None Carotid Pulse: normal pulsation bilaterally Bruit: None Apex: apical impulse normal  Auscultation Rhythm: atrial fibrillation S1: normal S2: normal Clicks: no Rub: no Murmurs: 2/6 systolic murmur radiating to the apex and axilla Gallop: None  EXTREMITIES Clubbing: no Edema: trace to 1+ bilateral pedal edema Pulses: peripheral pulses symmetrical Femoral Bruits: no Amputation: no SKIN Rash: no Cyanosis: no Embolic phemonenon: no Bruising: no NEURO Alert and Oriented to person, place and time: yes Non focal: yes  PSYCH: Pt  appears to have normal affect  Assessment and Plan   87 y.o. female with  ICD-10-CM ICD-9-CM  1. HTN (hypertension), benign-will continue to attempt to control this with medications and low-sodium diet. She has some lability of her blood pressure but does not have significant symptoms with her low pressures. We will continue with current medications. We will follow her pressures.  I10 401.1  2. Chronic atrial fibrillation (CMS-HCC)-rate is well controlled. Will continue with apixaban for anticoagulation. Will closely follow for evidence of bleeding. Will defer anti-arrhythmia or AV nodal medications due to controlled heart rate and some episodes of bradycardia at times. I48.2 427.31   3. Cardiomyopathy-ejection fraction 40 to 45% with mild to moderate MR unchanged from previously. Low-sodium diet is recommended. Daily weights were recommended. Repeat echo showed moderate to severe MR. Consideration for TEE to better evaluate the valves to guide further therapy was discussed. She and her daughter were present. Will proceed with TEE to begin work-up for consideration for valve intervention.  4.. Dizziness-is sent with increase of the losartan. We will go back to 1.5 mg daily.  5. Anxiety-we will continue to use 0.5 mg of Ativan as needed. Do not take trazodone along with Ativan.    These notes generated with voice recognition software. I apologize for typographical errors.  Sydnee Levans, MD  Pt seen and examined. No change from above.

## 2020-09-29 NOTE — Transfer of Care (Signed)
Immediate Anesthesia Transfer of Care Note  Patient: Linda [name]  Procedure(s) Performed: TRANSESOPHAGEAL ECHOCARDIOGRAM (TEE) (N/A )  Patient Location: Short Stay  Anesthesia Type:General  Level of Consciousness: awake, alert  and oriented  Airway & Oxygen Therapy: Patient Spontanous Breathing and Patient connected to nasal cannula oxygen  Post-op Assessment: Report given to RN and Post -op Vital signs reviewed and stable  Post vital signs: Reviewed and stable  Last Vitals:  Vitals Value Taken Time  BP    Temp    Pulse    Resp    SpO2      Last Pain:  Vitals:   09/29/20 1035  TempSrc: Oral  PainSc: 0-No pain         Complications: No complications documented.

## 2020-09-29 NOTE — Procedures (Signed)
   TRANSESOPHAGEAL ECHOCARDIOGRAM   NAME:  Linda [name]   MRN: [id number] DOB:  Nov 27, 1933   ADMIT DATE: 09/29/2020  INDICATIONS: TEE  PROCEDURE:   Informed consent was obtained prior to the procedure. The risks, benefits and alternatives for the procedure were discussed and the patient comprehended these risks.  Risks include, but are not limited to, cough, sore throat, vomiting, nausea, somnolence, esophageal and stomach trauma or perforation, bleeding, low blood pressure, aspiration, pneumonia, infection, trauma to the teeth and death.    After a procedural time-out, the patient was sedated per department of anesthesia.   The transesophageal probe was inserted in the esophagus and stomach without difficulty and multiple views were obtained. I was present for the entire procedure.    COMPLICATIONS:    There were no immediate complications.  FINDINGS:  LEFT VENTRICLE: EF = 55%. No regional wall motion abnormalities.  RIGHT VENTRICLE: Normal size and function.   LEFT ATRIUM: mildly enlarged  LEFT ATRIAL APPENDAGE: No thrombus.   RIGHT ATRIUM: normal  AORTIC VALVE:  Trileaflet.   MITRAL VALVE:    Normal. Degenerative valve with moderate mr.   TRICUSPID VALVE: Normal.  PULMONIC VALVE: Grossly normal.  INTERATRIAL SEPTUM: No PFO or ASD. Agitated saline contrast was used.   PERICARDIUM: No effusion  DESCENDING AORTA: NOrmal  CONCLUSION: Normal lv function. Moderate to severe central mitral reguritation

## 2020-09-29 NOTE — Anesthesia Preprocedure Evaluation (Signed)
Anesthesia Evaluation  Patient identified by MRN, date of birth, ID band Patient awake    Reviewed: Allergy & Precautions, H&P , NPO status , Patient's Chart, lab work & pertinent test results, reviewed documented beta blocker date and time   History of Anesthesia Complications (+) PONV and history of anesthetic complications  Airway Mallampati: II   Neck ROM: full    Dental  (+) Poor Dentition, Teeth Intact   Pulmonary neg pulmonary ROS,    Pulmonary exam normal        Cardiovascular hypertension, + Peripheral Vascular Disease and +CHF  + dysrhythmias Atrial Fibrillation  Rhythm:regular Rate:Normal     Neuro/Psych PSYCHIATRIC DISORDERS Anxiety TIACVA, No Residual Symptoms    GI/Hepatic Neg liver ROS, GERD  Medicated,  Endo/Other  diabetes  Renal/GU Renal disease  negative genitourinary   Musculoskeletal   Abdominal   Peds  Hematology negative hematology ROS (+)   Anesthesia Other Findings Past Medical History: No date: Anxiety No date: Arrhythmia     Comment:  paroxysmal atrial fibrillation No date: CHF (congestive heart failure) (HCC) No date: Colon polyp No date: Diverticulitis No date: Dysrhythmia No date: GERD (gastroesophageal reflux disease) No date: Hard of hearing No date: Hoarseness No date: Hyperlipidemia No date: Hypertension No date: Kidney stone No date: Motion sickness No date: Parathyroid disease ([place])     Comment:  Parathyroidectomy  No date: PONV (postoperative nausea and vomiting) No date: Pre-diabetes No date: PVC (premature ventricular contraction) No date: Skin cancer No date: Stroke ([place]) 04/2018: TIA (transient ischemic attack)     Comment:  no deficitis No date: UTI (lower urinary tract infection) No date: Varicose veins of both lower extremities No date: Wears hearing aid in both ears Past Surgical History: No date: ABDOMINAL HYSTERECTOMY No date:  APPENDECTOMY 03/05/2019: CATARACT EXTRACTION W/PHACO; Left     Comment:  Procedure: CATARACT EXTRACTION PHACO AND INTRAOCULAR               LENS PLACEMENT (IOC)   0:57 15.6% 9.05;  Surgeon:               Leandrew Koyanagi, MD;  Location: [place];              Service: Ophthalmology;  Laterality: Left;  Diabetic -               diet cotrolled 04/09/2019: CATARACT EXTRACTION W/PHACO; Right     Comment:  Procedure: CATARACT EXTRACTION PHACO AND INTRAOCULAR               LENS PLACEMENT (IOC) RIGHT DIABETIC 00:47.6  15.9%  7.60;              Surgeon: Leandrew Koyanagi, MD;  Location: [place];  Service: Ophthalmology;  Laterality:               Right; 2002: CHOLECYSTECTOMY 12/17/2019: ESOPHAGOGASTRODUODENOSCOPY (EGD) WITH PROPOFOL; N/A     Comment:  Procedure: ESOPHAGOGASTRODUODENOSCOPY (EGD) WITH               PROPOFOL;  Surgeon: Robert Bellow, MD;  Location:               [facility];  Service: Endoscopy;  Laterality: N/A; No date: LITHOTRIPSY 2004: PARATHYROIDECTOMY     Comment:  1 removed 2002: [place]: TONSILLECTOMY No date: TONSILLECTOMY 1984: TOTAL ABDOMINAL HYSTERECTOMY W/ BILATERAL SALPINGOOPHORECTOMY No date: VEIN  LIGATION AND STRIPPING BMI    Body Mass Index: 30.69 kg/m     Reproductive/Obstetrics negative OB ROS                             Anesthesia Physical Anesthesia Plan  ASA: IV  Anesthesia Plan: General   Post-op Pain Management:    Induction:   PONV Risk Score and Plan:   Airway Management Planned:   Additional Equipment:   Intra-op Plan:   Post-operative Plan:   Informed Consent: I have reviewed the patients History and Physical, chart, labs and discussed the procedure including the risks, benefits and alternatives for the proposed anesthesia with the patient or authorized representative who has indicated his/her understanding and acceptance.     Dental  Advisory Given  Plan Discussed with: CRNA  Anesthesia Plan Comments:         Anesthesia Quick Evaluation

## 2020-09-29 NOTE — Telephone Encounter (Signed)
  Chronic Care Management   Outreach Note  09/29/2020 Name: Linda [name] MRN: [id number] DOB: 02/18/1934  Referred by: Crecencio Mc, MD Reason for referral : Chronic Care Management (CHF, Afib, Weakness)   Upon chart review, patient scheduled for outpatient procedure at hospital today.  CCM follow up appointment will be rescheduled.  Follow Up Plan: RNCM will reschedule for within the next 10 business days.  [name] RN, MSN RN Care Management Coordinator [place] [phone] [email or web address]

## 2020-09-29 NOTE — Progress Notes (Signed)
*  PRELIMINARY RESULTS* Echocardiogram Echocardiogram Transesophageal has been performed.  Sherrie Sport 09/29/2020, 12:09 PM

## 2020-09-29 NOTE — Anesthesia Postprocedure Evaluation (Signed)
Anesthesia Post Note  Patient: Linda [name]  Procedure(s) Performed: TRANSESOPHAGEAL ECHOCARDIOGRAM (TEE) (N/A )  Patient location during evaluation: PACU Anesthesia Type: General Level of consciousness: awake and alert Pain management: pain level controlled Vital Signs Assessment: post-procedure vital signs reviewed and stable Respiratory status: spontaneous breathing, nonlabored ventilation, respiratory function stable and patient connected to nasal cannula oxygen Cardiovascular status: blood pressure returned to baseline and stable Postop Assessment: no apparent nausea or vomiting Anesthetic complications: no   No complications documented.   Last Vitals:  Vitals:   09/29/20 1230 09/29/20 1245  BP: 131/87 125/75  Pulse:    Resp: 18 (!) 22  Temp:    SpO2: 97% 92%    Last Pain:  Vitals:   09/29/20 1245  TempSrc:   PainSc: 0-No pain                 Molli Barrows

## 2020-09-29 NOTE — Discharge Instructions (Signed)
Transesophageal Echocardiogram Transesophageal echocardiogram (TEE) is a test that uses sound waves to take pictures of your heart. TEE is done by passing a small probe attached to a flexible tube down the part of the body that moves food from your mouth to your stomach (esophagus). The pictures give clear images of your heart. This can help your doctor see if there are problems with your heart. Tell a doctor about:  Any allergies you have.  All medicines you are taking. This includes vitamins, herbs, eye drops, creams, and over-the-counter medicines.  Any problems you or family members have had with anesthetic medicines.  Any blood disorders you have.  Any surgeries you have had.  Any medical conditions you have.  Any swallowing problems.  Whether you have or have had a blockage in the part of the body that moves food from your mouth to your stomach.  Whether you are pregnant or may be pregnant. What are the risks? In general, this is a safe procedure. But, problems may occur, such as:  Damage to nearby structures or organs.  A tear in the part of the body that moves food from your mouth to your stomach.  Irregular heartbeat.  Hoarse voice or trouble swallowing.  Bleeding. What happens before the procedure? Medicines  Ask your doctor about changing or stopping: ? Your normal medicines. ? Vitamins, herbs, and supplements. ? Over-the-counter medicines.  Do not take aspirin or ibuprofen unless you are told to. General instructions  Follow instructions from your doctor about what you cannot eat or drink.  You will take out any dentures or dental retainers.  Plan to have a responsible adult take you home from the hospital or clinic.  Plan to have a responsible adult care for you for the time you are told after you leave the hospital or clinic. This is important. What happens during the procedure?  An IV will be put into one of your veins.  You may be given: ? A  sedative. This medicine helps you relax. ? A medicine to numb the back of your throat. This may be sprayed or gargled.  Your blood pressure, heart rate, and breathing will be watched.  You may be asked to lie on your left side.  A bite block will be placed in your mouth. This keeps you from biting the tube.  The tip of the probe will be placed into the back of your mouth.  You will be asked to swallow.  Your doctor will take pictures of your heart.  The probe and bite block will be taken out after the test is done. The procedure may vary among doctors and hospitals.   What can I expect after the procedure?  You will be monitored until you leave the hospital or clinic. This includes checking your blood pressure, heart rate, breathing rate, and blood oxygen level.  Your throat may feel sore and numb. This will get better over time. You will not be allowed to eat or drink until the numbness has gone away.  It is common to have a sore throat for a day or two.  It is up to you to get the results of your procedure. Ask how to get your results when they are ready. Follow these instructions at home:  If you were given a sedative during your procedure, do not drive or use machines until your doctor says that it is safe.  Return to your normal activities when your doctor says that it is safe.    Keep all follow-up visits. Summary  TEE is a test that uses sound waves to take pictures of your heart.  You will be given a medicine to help you relax.  Do not drive or use machines until your doctor says that it is safe. This information is not intended to replace advice given to you by your health care provider. Make sure you discuss any questions you have with your health care provider. Document Revised: 01/27/2020 Document Reviewed: 01/27/2020 Elsevier Patient Education  2021 [employer]  

## 2020-09-30 ENCOUNTER — Encounter: Payer: Self-pay | Admitting: Cardiology

## 2020-10-04 ENCOUNTER — Ambulatory Visit (INDEPENDENT_AMBULATORY_CARE_PROVIDER_SITE_OTHER): Payer: Medicare Other | Admitting: *Deleted

## 2020-10-04 DIAGNOSIS — R531 Weakness: Secondary | ICD-10-CM

## 2020-10-04 DIAGNOSIS — I4811 Longstanding persistent atrial fibrillation: Secondary | ICD-10-CM | POA: Diagnosis not present

## 2020-10-04 DIAGNOSIS — M8580 Other specified disorders of bone density and structure, unspecified site: Secondary | ICD-10-CM

## 2020-10-04 DIAGNOSIS — R5383 Other fatigue: Secondary | ICD-10-CM

## 2020-10-04 DIAGNOSIS — R600 Localized edema: Secondary | ICD-10-CM

## 2020-10-04 NOTE — Chronic Care Management (AMB) (Signed)
Chronic Care Management   CCM RN Visit Note  10/04/2020 Name: LARI LINSON MRN: [id number] DOB: 27-Aug-1933  Subjective: [name] is a 87 y.o. year old female who is a primary care patient of Crecencio Mc, MD. The care management team was consulted for assistance with disease management and care coordination needs.    Engaged with patient by telephone for follow up visit in response to provider referral for case management and/or care coordination services.   Consent to Services:  The patient was given information about Chronic Care Management services, agreed to services, and gave verbal consent prior to initiation of services.  Please see initial visit note for detailed documentation.   Patient agreed to services and verbal consent obtained.   Assessment: Review of patient past medical history, allergies, medications, health status, including review of consultants reports, laboratory and other test data, was performed as part of comprehensive evaluation and provision of chronic care management services.   SDOH (Social Determinants of Health) assessments and interventions performed:    CCM Care Plan  Allergies  Allergen Reactions  . Barium Iodide     Unknown  . Crestor [Rosuvastatin Calcium] Other (See Comments)    Myalgia   . Penicillins Rash  . Sulfa Antibiotics Rash  . Sulfonamide Derivatives Rash    Outpatient Encounter Medications as of 10/04/2020  Medication Sig Note  . acetaminophen (TYLENOL) 500 MG tablet Take 500 mg by mouth every 6 (six) hours as needed for moderate pain or headache. (Patient not taking: Reported on 09/29/2020)   . cetirizine (ZYRTEC) 10 MG tablet Take 10 mg by mouth daily as needed for allergies.   . Cholecalciferol (VITAMIN D) 2000 units tablet Take 2,000 Units by mouth daily.   Marland Kitchen ELIQUIS 5 MG TABS tablet Take 1 tablet (5 mg total) by mouth 2 (two) times daily.   Marland Kitchen estradiol (ESTRACE) 0.1 MG/GM vaginal cream Place 1 g vaginally 3 (three)  times a week. Pea sized amount per urethra (Patient not taking: No sig reported)   . furosemide (LASIX) 20 MG tablet Take 40 mg by mouth daily.   Marland Kitchen Ketotifen Fumarate (ALLERGY EYE DROPS OP) Place 1 drop into both eyes daily as needed (allergies).   . LORazepam (ATIVAN) 0.5 MG tablet Take 1 tablet (0.5 mg total) by mouth at bedtime as needed for anxiety. (Patient not taking: No sig reported)   . losartan (COZAAR) 25 MG tablet Take 1 tablet (25 mg total) by mouth daily. (Patient taking differently: Take 12.5 mg by mouth at bedtime.) 08/25/2020: Reports taking 1/2 tablet nightly  . potassium chloride (KLOR-CON) 10 MEQ tablet Take 10-20 mEq by mouth daily. 09/24/2020: Pt states she takes 10 meq if she is going to go lay down and 20 meq if she is not going to lay down after taking  . traZODone (DESYREL) 50 MG tablet Take 50 mg by mouth at bedtime.    No facility-administered encounter medications on file as of 10/04/2020.    Patient Active Problem List   Diagnosis Date Noted  . Proximal muscle weakness 08/24/2020  . Mitral valve disease 07/09/2020  . Fatigue 05/29/2020  . Hypokalemia 12/16/2019  . Myalgia due to statin 12/15/2019  . Acquired thrombophilia ([place]) 12/15/2019  . Skin lesion of hand 08/27/2018  . Hospital discharge follow-up 05/25/2018  . History of CVA (cerebrovascular accident) without residual deficits 05/10/2018  . Parent-child estrangement Delma Post 02/09/2018  . Basal cell carcinoma (BCC) of right lower extremity 08/08/2017  . Hematuria, gross  08/08/2017  . Extremity atherosclerosis with intermittent claudication ([place]) 08/08/2017  . Generalized anxiety disorder 01/31/2017  . 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 Controlled type 2 diabetes mellitus with microalbuminuria, without long-term current use of insulin ([place]) 01/20/2016  . Insomnia secondary to anxiety 10/09/2015  . Cardiomyopathy, idiopathic ([place]) 09/03/2015  . Polycythemia, secondary 07/04/2015  . Pre-syncope 07/02/2015  .  Dysuria 12/28/2014  . Osteopenia 08/21/2014  . Gastritis 05/26/2014  . GERD (gastroesophageal reflux disease) 03/04/2014  . Medicare annual wellness visit, subsequent 01/13/2014  . Essential hypertension 11/04/2013  . Screening for breast cancer 07/01/2013  . Left shoulder pain 03/21/2013  . Hoarseness of voice 03/21/2013  . Edema 09/05/2010  . VENTRICULAR HYPERTROPHY, LEFT 08/16/2010  . Hyperlipidemia 06/03/2010  . ATRIAL FIBRILLATION 08/13/2009    Conditions to be addressed/monitored:Atrial Fibrillation, CHF and Weakness  Care Plan : Heart Failure  Updates made by Leona Singleton, RN since 10/04/2020 12:00 AM  Problem: Heart Failure (increasing edema and shortness of breath   Priority: High  Long-Range Goal: Symptom Exacerbation Prevented or Minimized   Start Date: 08/25/2020  Expected End Date: 02/15/2021  This Visit's Progress: On track  Recent Progress: Not on track  Priority: High  Current Barriers:  Marland Kitchen Knowledge deficits related to basic heart failure pathophysiology and self care management as evidenced by increasing shortness of breath with exertion and increase in lower extremity edema.  Patient reports she just completed TEE last week to look at her valves and Cardiology is sending [facility] to [facility] for consultation.  Feels she tolerated the procedure well.  States her shortness of breath is better, but still has lower extremity edema.  Endorses keeping lower extremities elevated when not up moving around.  Reports compliance with Lasix. . Unable to perform ADLs independently . Unable to perform IADLs independently . Financial strain-patient reporting her Eliquis copay has increased to $100 monthly.   Nurse Case Manager Clinical Goal(s):   Patient will report no hospitalization related to heart failure exacerbation within the next 90 days.  patient will weigh self daily and record  patient will verbalize understanding of Heart Failure Action Plan and when to call  doctor  patient will take all Heart Failure mediations as prescribed  Patient will continue to monitor heart rates and notify provider if elevated heart rates sustains Interventions:  . Collaboration with Crecencio Mc, MD regarding development and update of comprehensive plan of care as evidenced by provider attestation and co-signature . Inter-disciplinary care team collaboration (see longitudinal plan of care) . Basic overview and discussion of pathophysiology of Heart Failure . Reviewed Heart Failure Action Plan in depth and provided written copy; encouraged patient to review . Encouraged continued daily weight monitoring, documenting in log for provider review . Reviewed medications and encouraged medication compliance . Reviewed role of diuretics in prevention of fluid overload and dosage of Lasix (patient verbalized her understanding and states taking 2 tablets daily) . Discussed increased heart rates and atrial fibrillation and encouraged patient to continue to monitor feelings of rapid rates and to notify provider if rates sustains . Reviewed and discussed upcoming scheduled appointments, awaiting consultation appointment . Self-awareness of signs/symptoms of worsening disease encouraged . Sent heart failure zones and action plan education; sent EMMI Video for Atrial Fibrillation and Heart Failure rest and exercise and encouraged patient to review . Confirmed patient received 2022 Calendar Booklet and encouraged patient to use for documentation of appointments and vital signs . Encouraged elevation of lower extremities and continued use  of support hose Patient Goals/Self-Care Activities:  . Call office if I gain more than 2 pounds in one day or 5 pounds in one week . Keep legs up while sitting and wear support hose . Watch for swelling in feet, ankles and legs every day . Weigh myself daily and keep in log to take to provider for review . Follow rescue plan and know when to call  the doctor Follow Up Plan: The care management team will reach out to the patient again over the next 20 business days.     Care Plan : Increase in weakness limiting patient independence  Updates made by Leona Singleton, RN since 10/04/2020 12:00 AM  Problem: Functional Decline   Priority: Medium  Long-Range Goal: Functional Decline Minimized over the next 90 days   Start Date: 08/25/2020  Expected End Date: 02/15/2021  This Visit's Progress: Not on track  Recent Progress: Not on track  Priority: Medium  Current Barriers:  Marland Kitchen Knowledge Deficits related to increasing strength in patient with declining functional ability as evidenced by patient reporting she has little energy and shortness of breath with minimal activity.  Patient reporting continued weakness.  States home health therapy, she feels will need after her valve surgery so she has requested to hold on therapy at this time.  Does state she feels a little stronger. . Unable to perform ADLs independently . Unable to perform IADLs independently . Care Coordination needs related to home health physical therapy  in a patient with decreasing strength Clinical Goal(s):  . patient will demonstrate improved adherence to prescribed treatment plan for decreasing falls as evidenced by patient reporting and review of EMR . patient will verbalize using fall risk reduction strategies discussed . patient will not experience additional falls . patient will verbalize understanding of plan for safety and increasing strength Interventions:  . Collaboration with Crecencio Mc, MD regarding development and update of comprehensive plan of care as evidenced by provider attestation and co-signature . Inter-disciplinary care team collaboration (see longitudinal plan of care) . Discussed fall precaution and Fall prevention strategies . Reviewed medications and discussed potential side effects of medications such as dizziness and frequent  urination . Assessed patients knowledge of fall risk prevention and discussed increasing activity slowly as tolerated . Discussed orders for home health physical therapy and encouraged patient to participate with therapy; acknowledged her feelings of apprehension and reassured her physical therapist would be there to help her and give tips . Discussed possible medical reasons for decrease in strength (heart failure, mitral valve problems) . Encouraged patient to continue to monitor blood pressures daily monitoring for hypotension and prevention of falls . Maximum independence promoted as safely as can providing self care and seeking assistance from home aide as necessary; instructed do not push herself to do things that feel unsafe or that she is not used to doing alone . Allowed patient to verbalize her feelings of frustration with change of activity level she was previously used to and offered support . Energy conservation techniques promoted . Emotional support and empathy provided to patient . Discussed with patient to prepare for rehabilitation services to develop an individualized activity and exercise program as indicated, such as progressive resistance strengthening, balance and activity tolerance training or home exercise program . Promoted gradual increase in intensity of activity and exercise as tolerated, such as duration, frequency, exercise repetition and sets and intensity Patient Goals/Self-Care Deficits:   . Work with home health physical therapist [phone]) . Do  the exercise that the therapist taught me . Set a daily activity goal . Use cane with walking, fall precautions, change positions slowly . Start slowly and increase the amount of time for working out as tolerated  . Do not do any activity that does not feel safe or that you normally would not do on your own (wait for some assistance from family or home aid) Follow Up Plan: The care management team will reach out to  the patient again over the next 20 business days.      Plan:The care management team will reach out to the patient again over the next 20 business days.  [name] RN, MSN RN Care Management Coordinator [place] [phone] [email or web address]

## 2020-10-04 NOTE — Patient Instructions (Signed)
Visit Information  PATIENT GOALS: Goals Addressed            This Visit's Progress   . (RNCM) Increase My Muscle Strength   Not on track    Timeframe:  Long-Range Goal Priority:  Medium Start Date:  08/25/2020                           Expected End Date: 02/15/21                      Follow Up Date 10/18/20    . Work with home health physical therapist [phone]) . Do the exercise that the therapist taught me . Set a daily activity goal . Use cane with walking, fall precautions, change positions slowly . Start slowly and increase the amount of time for working out as tolerated  . Do not do any activity that does not feel safe or that you normally would not do on your own (wait for some assistance from family or home aid)   Why is this important?    Walking and easy exercises make you stronger. These also give you energy.   Every little bit helps, at any age.    Notes:     Marland Kitchen (RNCM) Track and Manage Fluids and Swelling-Heart Failure   On track    Timeframe:  Long-Range Goal Priority:  High Start Date:   08/25/20                          Expected End Date:   02/15/21                    Follow Up Date 10/18/20    . Call office if I gain more than 2 pounds in one day or 5 pounds in one week . Keep legs up while sitting and wear support hose . Watch for swelling in feet, ankles and legs every day . Weigh myself daily and keep in log to take to provider for review . Follow rescue plan and know when to call the doctor   Why is this important?    It is important to check your weight daily and watch how much salt and liquids you have.   It will help you to manage your heart failure.    Notes:        Patient verbalizes understanding of instructions provided today and agrees to view in [place].   The care management team will reach out to the patient again over the next 20 business days.   [name] RN, MSN RN Care Management Coordinator [name] [phone] [email or web address]

## 2020-10-13 ENCOUNTER — Telehealth: Payer: Self-pay | Admitting: Internal Medicine

## 2020-10-13 NOTE — Telephone Encounter (Signed)
Patient does not feel she needs to see [place] at this moment. She's going to see her heart doctor and believes her swelling is coming from her heart.   msg from Vascular

## 2020-10-18 ENCOUNTER — Ambulatory Visit (INDEPENDENT_AMBULATORY_CARE_PROVIDER_SITE_OTHER): Payer: Medicare Other | Admitting: *Deleted

## 2020-10-18 DIAGNOSIS — I482 Chronic atrial fibrillation, unspecified: Secondary | ICD-10-CM | POA: Diagnosis not present

## 2020-10-18 DIAGNOSIS — R5383 Other fatigue: Secondary | ICD-10-CM

## 2020-10-18 DIAGNOSIS — I517 Cardiomegaly: Secondary | ICD-10-CM

## 2020-10-18 DIAGNOSIS — R600 Localized edema: Secondary | ICD-10-CM

## 2020-10-18 DIAGNOSIS — R531 Weakness: Secondary | ICD-10-CM

## 2020-10-18 DIAGNOSIS — I4811 Longstanding persistent atrial fibrillation: Secondary | ICD-10-CM

## 2020-10-18 NOTE — Patient Instructions (Signed)
Visit Information  PATIENT GOALS: Goals Addressed            This Visit's Progress   . (RNCM) Increase My Muscle Strength   Not on track    Timeframe:  Long-Range Goal Priority:  Medium Start Date:  08/25/2020                           Expected End Date: 02/15/21                      Follow Up Date 11/03/20    . Consider resuming home therapy . Use cane with walking, fall precautions, change positions slowly . Start slowly and increase the amount of time for working out as tolerated  . Do not do any activity that does not feel safe or that you normally would not do on your own (wait for some assistance from family or home aid)   Why is this important?    Walking and easy exercises make you stronger. These also give you energy.   Every little bit helps, at any age.    Notes:     Marland Kitchen (RNCM) Track and Manage Fluids and Swelling-Heart Failure   On track    Timeframe:  Long-Range Goal Priority:  High Start Date:   08/25/20                          Expected End Date:   02/15/21                    Follow Up Date 11/03/20    . Call office if I gain more than 2 pounds in one day or 5 pounds in one week . Keep legs up while sitting and wear support hose . Watch for swelling in feet, ankles and legs every day . Weigh myself daily and keep in log to take to provider for review . Follow rescue plan and know when to call the doctor   Why is this important?    It is important to check your weight daily and watch how much salt and liquids you have.   It will help you to manage your heart failure.    Notes:        Patient verbalizes understanding of instructions provided today and agrees to view in [place].   The care management team will reach out to the patient again over the next 20 business days.   [name] RN, MSN RN Care Management Coordinator [place] [phone] [email or web address]

## 2020-10-18 NOTE — Chronic Care Management (AMB) (Signed)
Chronic Care Management   CCM RN Visit Note  10/18/2020 Name: Linda [name] MRN: [id number] DOB: Jun 18, 1934  Subjective: Linda [name] is a 87 y.o. year old female who is a primary care patient of Crecencio Mc, MD. The care management team was consulted for assistance with disease management and care coordination needs.    Engaged with patient by telephone for follow up visit in response to provider referral for case management and/or care coordination services.   Consent to Services:  The patient was given information about Chronic Care Management services, agreed to services, and gave verbal consent prior to initiation of services.  Please see initial visit note for detailed documentation.   Patient agreed to services and verbal consent obtained.   Assessment: Review of patient past medical history, allergies, medications, health status, including review of consultants reports, laboratory and other test data, was performed as part of comprehensive evaluation and provision of chronic care management services.   SDOH (Social Determinants of Health) assessments and interventions performed:    CCM Care Plan  Allergies  Allergen Reactions  . Barium Iodide     Unknown  . Crestor [Rosuvastatin Calcium] Other (See Comments)    Myalgia   . Penicillins Rash  . Sulfa Antibiotics Rash  . Sulfonamide Derivatives Rash    Outpatient Encounter Medications as of 10/18/2020  Medication Sig Note  . chlorthalidone (HYGROTON) 25 MG tablet Take 25 mg by mouth daily.   Marland Kitchen ELIQUIS 5 MG TABS tablet Take 1 tablet (5 mg total) by mouth 2 (two) times daily.   . furosemide (LASIX) 20 MG tablet Take 40 mg by mouth daily.   Marland Kitchen losartan (COZAAR) 25 MG tablet Take 1 tablet (25 mg total) by mouth daily. (Patient taking differently: Take 12.5 mg by mouth at bedtime.) 08/25/2020: Reports taking 1/2 tablet nightly  . traZODone (DESYREL) 50 MG tablet Take 50 mg by mouth at bedtime.   Marland Kitchen acetaminophen (TYLENOL) 500  MG tablet Take 500 mg by mouth every 6 (six) hours as needed for moderate pain or headache. (Patient not taking: Reported on 09/29/2020)   . cetirizine (ZYRTEC) 10 MG tablet Take 10 mg by mouth daily as needed for allergies.   . Cholecalciferol (VITAMIN D) 2000 units tablet Take 2,000 Units by mouth daily. (Patient not taking: Reported on 10/18/2020)   . estradiol (ESTRACE) 0.1 MG/GM vaginal cream Place 1 g vaginally 3 (three) times a week. Pea sized amount per urethra (Patient not taking: No sig reported)   . Ketotifen Fumarate (ALLERGY EYE DROPS OP) Place 1 drop into both eyes daily as needed (allergies).   . LORazepam (ATIVAN) 0.5 MG tablet Take 1 tablet (0.5 mg total) by mouth at bedtime as needed for anxiety. (Patient not taking: No sig reported)   . potassium chloride (KLOR-CON) 10 MEQ tablet Take 10-20 mEq by mouth daily. 09/24/2020: Pt states she takes 10 meq if she is going to go lay down and 20 meq if she is not going to lay down after taking   No facility-administered encounter medications on file as of 10/18/2020.    Patient Active Problem List   Diagnosis Date Noted  . Proximal muscle weakness 08/24/2020  . Mitral valve disease 07/09/2020  . Fatigue 05/29/2020  . Hypokalemia 12/16/2019  . Myalgia due to statin 12/15/2019  . Acquired thrombophilia ([place]) 12/15/2019  . Skin lesion of hand 08/27/2018  . Hospital discharge follow-up 05/25/2018  . History of CVA (cerebrovascular accident) without residual deficits 05/10/2018  .  Parent-child estrangement [facility] Post 02/09/2018  . Basal cell carcinoma (BCC) of right lower extremity 08/08/2017  . Hematuria, gross 08/08/2017  . Extremity atherosclerosis with intermittent claudication ([place]) 08/08/2017  . Generalized anxiety disorder 01/31/2017  . 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 Controlled type 2 diabetes mellitus with microalbuminuria, without long-term current use of insulin ([place]) 01/20/2016  . Insomnia secondary to anxiety 10/09/2015  .  Cardiomyopathy, idiopathic ([place]) 09/03/2015  . Polycythemia, secondary 07/04/2015  . Pre-syncope 07/02/2015  . Dysuria 12/28/2014  . Osteopenia 08/21/2014  . Gastritis 05/26/2014  . GERD (gastroesophageal reflux disease) 03/04/2014  . Medicare annual wellness visit, subsequent 01/13/2014  . Essential hypertension 11/04/2013  . Screening for breast cancer 07/01/2013  . Left shoulder pain 03/21/2013  . Hoarseness of voice 03/21/2013  . Edema 09/05/2010  . VENTRICULAR HYPERTROPHY, LEFT 08/16/2010  . Hyperlipidemia 06/03/2010  . ATRIAL FIBRILLATION 08/13/2009    Conditions to be addressed/monitored:Atrial Fibrillation, CHF and Weakness  Care Plan : Heart Failure  Updates made by Leona Singleton, RN since 10/18/2020 12:00 AM  Problem: Heart Failure (increasing edema and shortness of breath   Priority: High  Long-Range Goal: Symptom Exacerbation Prevented or Minimized   Start Date: 08/25/2020  Expected End Date: 02/15/2021  This Visit's Progress: On track  Recent Progress: On track  Priority: High  Current Barriers:  Marland Kitchen Knowledge deficits related to basic heart failure pathophysiology and self care management as evidenced by increasing shortness of breath with exertion and increase in lower extremity edema.  Patient reports not feeling well today, states she has good days and bad days.  States her shortness of breath is about the same and her lower extremity edema is a little better right now.  Weight this morning was 168.5 pounds.  Blood pressure was 126/80.  Reports blood pressures have been pretty good lately.  Has scheduled consultation appointments with [facility] and Surgeon later this week for possible valve surgery. . Unable to perform ADLs independently . Unable to perform IADLs independently . Financial strain-patient reporting her Eliquis copay has increased to $100 monthly.   Nurse Case Manager Clinical Goal(s):   Patient will report no hospitalization related to heart  failure exacerbation within the next 90 days.  patient will weigh self daily and record  patient will verbalize understanding of Heart Failure Action Plan and when to call doctor  patient will take all Heart Failure mediations as prescribed  Patient will continue to monitor heart rates and notify provider if elevated heart rates sustains Interventions:  . Collaboration with Crecencio Mc, MD regarding development and update of comprehensive plan of care as evidenced by provider attestation and co-signature . Inter-disciplinary care team collaboration (see longitudinal plan of care) . Basic overview and discussion of pathophysiology of Heart Failure . Reviewed Heart Failure Action Plan in depth and provided written copy; encouraged patient to review . Encouraged continued daily weight monitoring, documenting in log for provider review . Reviewed medications and encouraged medication compliance; discussed timing of taking second fluid pill (Chlorthalidone) . Reviewed role of diuretics in prevention of fluid overload and dosage of Lasix (patient verbalized her understanding and states taking 1 tablets daily) . Discussed increased heart rates and atrial fibrillation and encouraged patient to continue to monitor feelings of rapid rates and to notify provider if rates sustains . Reviewed and discussed upcoming scheduled appointments, awaiting consultation appointment . Self-awareness of signs/symptoms of worsening disease encouraged . Sent heart failure zones and action plan education; sent EMMI Video for Atrial  Fibrillation and Heart Failure rest and exercise and encouraged patient to review . Encouraged patient to use 2022 Calendar Booklet for documentation of vital signs . Encouraged elevation of lower extremities and continued use of support hose Patient Goals/Self-Care Activities:  . Call office if I gain more than 2 pounds in one day or 5 pounds in one week . Keep legs up while sitting and  wear support hose . Watch for swelling in feet, ankles and legs every day . Weigh myself daily and keep in log to take to provider for review . Follow rescue plan and know when to call the doctor Follow Up Plan: The care management team will reach out to the patient again over the next 20 business days.     Care Plan : Increase in weakness limiting patient independence  Updates made by Leona Singleton, RN since 10/18/2020 12:00 AM  Problem: Functional Decline   Priority: Medium  Long-Range Goal: Functional Decline Minimized over the next 90 days   Start Date: 08/25/2020  Expected End Date: 02/15/2021  This Visit's Progress: Not on track  Recent Progress: Not on track  Priority: Medium  Current Barriers:  Marland Kitchen Knowledge Deficits related to increasing strength in patient with declining functional ability as evidenced by patient reporting she has little energy and shortness of breath with minimal activity.  Patient reporting continued weakness.  States she has cancelled home therapy until after her valve procedure.  Feels she will tolerate it after surgery. . Unable to perform ADLs independently . Unable to perform IADLs independently . Care Coordination needs related to home health physical therapy  in a patient with decreasing strength Clinical Goal(s):  . patient will demonstrate improved adherence to prescribed treatment plan for decreasing falls as evidenced by patient reporting and review of EMR . patient will verbalize using fall risk reduction strategies discussed . patient will not experience additional falls . patient will verbalize understanding of plan for safety and increasing strength Interventions:  . Collaboration with Crecencio Mc, MD regarding development and update of comprehensive plan of care as evidenced by provider attestation and co-signature . Inter-disciplinary care team collaboration (see longitudinal plan of care) . Discussed fall precaution and Fall prevention  strategies . Reviewed medications and discussed potential side effects of medications such as dizziness and frequent urination . Assessed patients knowledge of fall risk prevention and discussed increasing activity slowly as tolerated . Acknowledged her feelings of apprehension and reassured her physical therapist would be there to help her and give tips . Discussed possible medical reasons for decrease in strength (heart failure, mitral valve problems) . Encouraged patient to continue to monitor blood pressures daily monitoring for hypotension and prevention of falls . Maximum independence promoted as safely as can providing self care and seeking assistance from home aide as necessary; instructed do not push herself to do things that feel unsafe or that she is not used to doing alone . Allowed patient to verbalize her feelings of frustration with change of activity level she was previously used to and offered support . Energy conservation techniques promoted . Emotional support and empathy provided to patient . Discussed with patient to prepare for rehabilitation services to develop an individualized activity and exercise program as indicated, such as progressive resistance strengthening, balance and activity tolerance training or home exercise program . Promoted gradual increase in intensity of activity and exercise as tolerated, such as duration, frequency, exercise repetition and sets and intensity Patient Goals/Self-Care Deficits:   . Consider  resuming home therapy . Use cane with walking, fall precautions, change positions slowly . Start slowly and increase the amount of time for working out as tolerated  . Do not do any activity that does not feel safe or that you normally would not do on your own (wait for some assistance from family or home aid) Follow Up Plan: The care management team will reach out to the patient again over the next 20 business days.      Plan:The care management team  will reach out to the patient again over the next 20 business days.  [name] RN, MSN RN Care Management Coordinator [place] [phone] [email or web address]

## 2020-11-03 ENCOUNTER — Ambulatory Visit: Payer: Medicare Other | Admitting: *Deleted

## 2020-11-03 DIAGNOSIS — I4811 Longstanding persistent atrial fibrillation: Secondary | ICD-10-CM

## 2020-11-03 DIAGNOSIS — R531 Weakness: Secondary | ICD-10-CM

## 2020-11-03 DIAGNOSIS — I428 Other cardiomyopathies: Secondary | ICD-10-CM

## 2020-11-03 DIAGNOSIS — R5383 Other fatigue: Secondary | ICD-10-CM

## 2020-11-03 DIAGNOSIS — I482 Chronic atrial fibrillation, unspecified: Secondary | ICD-10-CM

## 2020-11-03 DIAGNOSIS — R600 Localized edema: Secondary | ICD-10-CM

## 2020-11-03 DIAGNOSIS — I429 Cardiomyopathy, unspecified: Secondary | ICD-10-CM

## 2020-11-03 DIAGNOSIS — I059 Rheumatic mitral valve disease, unspecified: Secondary | ICD-10-CM

## 2020-11-03 DIAGNOSIS — I517 Cardiomegaly: Secondary | ICD-10-CM

## 2020-11-03 NOTE — Chronic Care Management (AMB) (Signed)
Chronic Care Management   CCM RN Visit Note  11/03/2020 Name: Linda [name] MRN: [id number] DOB: 10/25/1933  Subjective: Linda [name] is a 87 y.o. year old female who is a primary care patient of Crecencio Mc, MD. The care management team was consulted for assistance with disease management and care coordination needs.    Engaged with patient by telephone for follow up visit in response to provider referral for case management and/or care coordination services.   Consent to Services:  The patient was given information about Chronic Care Management services, agreed to services, and gave verbal consent prior to initiation of services.  Please see initial visit note for detailed documentation.   Patient agreed to services and verbal consent obtained.   Assessment: Review of patient past medical history, allergies, medications, health status, including review of consultants reports, laboratory and other test data, was performed as part of comprehensive evaluation and provision of chronic care management services.   SDOH (Social Determinants of Health) assessments and interventions performed:    CCM Care Plan  Allergies  Allergen Reactions  . Barium Iodide     Unknown  . Crestor [Rosuvastatin Calcium] Other (See Comments)    Myalgia   . Penicillins Rash  . Sulfa Antibiotics Rash  . Sulfonamide Derivatives Rash    Outpatient Encounter Medications as of 11/03/2020  Medication Sig Note  . ELIQUIS 5 MG TABS tablet Take 1 tablet (5 mg total) by mouth 2 (two) times daily.   . furosemide (LASIX) 20 MG tablet Take 40 mg by mouth daily. 11/03/2020: Reports taking 1 tablet daily  . losartan (COZAAR) 25 MG tablet Take 1 tablet (25 mg total) by mouth daily. (Patient taking differently: Take 12.5 mg by mouth at bedtime.) 08/25/2020: Reports taking 1/2 tablet nightly  . acetaminophen (TYLENOL) 500 MG tablet Take 500 mg by mouth every 6 (six) hours as needed for moderate pain or headache. (Patient  not taking: Reported on 09/29/2020)   . cetirizine (ZYRTEC) 10 MG tablet Take 10 mg by mouth daily as needed for allergies.   . chlorthalidone (HYGROTON) 25 MG tablet Take 25 mg by mouth daily.   . Cholecalciferol (VITAMIN D) 2000 units tablet Take 2,000 Units by mouth daily. (Patient not taking: Reported on 10/18/2020)   . estradiol (ESTRACE) 0.1 MG/GM vaginal cream Place 1 g vaginally 3 (three) times a week. Pea sized amount per urethra (Patient not taking: No sig reported)   . Ketotifen Fumarate (ALLERGY EYE DROPS OP) Place 1 drop into both eyes daily as needed (allergies).   . LORazepam (ATIVAN) 0.5 MG tablet Take 1 tablet (0.5 mg total) by mouth at bedtime as needed for anxiety. (Patient not taking: No sig reported)   . potassium chloride (KLOR-CON) 10 MEQ tablet Take 10-20 mEq by mouth daily. 09/24/2020: Pt states she takes 10 meq if she is going to go lay down and 20 meq if she is not going to lay down after taking  . traZODone (DESYREL) 50 MG tablet Take 50 mg by mouth at bedtime.    No facility-administered encounter medications on file as of 11/03/2020.    Patient Active Problem List   Diagnosis Date Noted  . Proximal muscle weakness 08/24/2020  . Mitral valve disease 07/09/2020  . Fatigue 05/29/2020  . Hypokalemia 12/16/2019  . Myalgia due to statin 12/15/2019  . Acquired thrombophilia ([place]) 12/15/2019  . Skin lesion of hand 08/27/2018  . Hospital discharge follow-up 05/25/2018  . History of CVA (cerebrovascular accident)  without residual deficits 05/10/2018  . Parent-child estrangement Delma Post 02/09/2018  . Basal cell carcinoma (BCC) of right lower extremity 08/08/2017  . Hematuria, gross 08/08/2017  . Extremity atherosclerosis with intermittent claudication ([place]) 08/08/2017  . Generalized anxiety disorder 01/31/2017  . 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 Controlled type 2 diabetes mellitus with microalbuminuria, without long-term current use of insulin ([place]) 01/20/2016  . Insomnia  secondary to anxiety 10/09/2015  . Cardiomyopathy, idiopathic ([place]) 09/03/2015  . Polycythemia, secondary 07/04/2015  . Pre-syncope 07/02/2015  . Dysuria 12/28/2014  . Osteopenia 08/21/2014  . Gastritis 05/26/2014  . GERD (gastroesophageal reflux disease) 03/04/2014  . Medicare annual wellness visit, subsequent 01/13/2014  . Essential hypertension 11/04/2013  . Screening for breast cancer 07/01/2013  . Left shoulder pain 03/21/2013  . Hoarseness of voice 03/21/2013  . Edema 09/05/2010  . VENTRICULAR HYPERTROPHY, LEFT 08/16/2010  . Hyperlipidemia 06/03/2010  . ATRIAL FIBRILLATION 08/13/2009    Conditions to be addressed/monitored:CHF and Weakness  Care Plan : Heart Failure  Updates made by Leona Singleton, RN since 11/03/2020 12:00 AM  Problem: Heart Failure (increasing edema and shortness of breath   Priority: High  Long-Range Goal: Symptom Exacerbation Prevented or Minimized   Start Date: 08/25/2020  Expected End Date: 02/15/2021  This Visit's Progress: Not on track  Recent Progress: On track  Priority: High  Current Barriers:  Marland Kitchen Knowledge deficits related to basic heart failure pathophysiology and self care management as evidenced by increasing shortness of breath with exertion and increase in lower extremity edema.  Patient reports not feeling well today, states she has increase in ankle edema today.  Is trying to rest and elevate legs/feet.  Reports shortness of breath is a little worse today but she has been very active and has painters at the house.  Reports compliance with her fluid medicine.  Weight this morning was 169.5 (near baseline).  Blood pressures have ranged 100-140/60-80's.  Has seen heart specialist at [facility] and is scheduled for Mitral Valve surgery on 11/23/20. . Unable to perform ADLs independently . Unable to perform IADLs independently . Financial strain-patient reporting her Eliquis copay has increased to $100 monthly.   Nurse Case Manager Clinical Goal(s):    Patient will report no hospitalization related to heart failure exacerbation within the next 90 days.  patient will weigh self daily and record  patient will verbalize understanding of Heart Failure Action Plan and when to call doctor  patient will take all Heart Failure mediations as prescribed  Patient will continue to monitor heart rates and notify provider if elevated heart rates sustains Interventions:  . Collaboration with Crecencio Mc, MD regarding development and update of comprehensive plan of care as evidenced by provider attestation and co-signature . Inter-disciplinary care team collaboration (see longitudinal plan of care) . Basic overview and discussion of pathophysiology of Heart Failure . Reviewed Heart Failure Action Plan in depth and provided written copy; encouraged patient to review . Encouraged continued daily weight monitoring, documenting in log for provider review . Reviewed medications and encouraged medication compliance; discussed timing of taking second fluid pill (Chlorthalidone) . Reviewed role of diuretics in prevention of fluid overload and dosage of Lasix (patient verbalized her understanding and states taking 1 tablets daily) . Discussed increased heart rates and atrial fibrillation and encouraged patient to continue to monitor feelings of rapid rates and to notify provider if rates sustains . Encouraged to attend scheduled upcoming medical appointments . Sending mitral valve surgery educational handouts . Self-awareness of signs/symptoms  of worsening disease encouraged . Sent heart failure zones and action plan education; sent EMMI Video for Atrial Fibrillation and Heart Failure rest and exercise and encouraged patient to review . Encouraged patient to continue to use 2022 Calendar Booklet for documentation of vital signs . Encouraged elevation of lower extremities and continued use of support hose; discussed and encouraged active range of motion to  lower extremities while sitting with legs/feet elevated Patient Goals/Self-Care Activities:  . Call office if I gain more than 2 pounds in one day or 5 pounds in one week . Keep legs up/elevated while sitting and wear support hose . Watch for swelling in feet, ankles and legs every day . Weigh myself daily and keep in log to take to provider for review . Follow rescue plan and know when to call the doctor Follow Up Plan: The care management team will reach out to the patient again over the next 30 business days.     Care Plan : Increase in weakness limiting patient independence  Updates made by Leona Singleton, RN since 11/03/2020 12:00 AM  Problem: Functional Decline   Priority: Medium  Long-Range Goal: Functional Decline Minimized over the next 90 days   Start Date: 08/25/2020  Expected End Date: 02/15/2021  This Visit's Progress: On track  Recent Progress: Not on track  Priority: Medium  Current Barriers:  Marland Kitchen Knowledge Deficits related to increasing strength in patient with declining functional ability as evidenced by patient reporting she has little energy and shortness of breath with minimal activity.  Patient continues to report feelings of weakness.  Does state she has been able to walk for 5 minutes a day around the house.  Denies any falls and fall precautions and preventions reviewed and discussed. . Unable to perform ADLs independently . Unable to perform IADLs independently . Care Coordination needs related to home health physical therapy  in a patient with decreasing strength Clinical Goal(s):  . patient will demonstrate improved adherence to prescribed treatment plan for decreasing falls as evidenced by patient reporting and review of EMR . patient will verbalize using fall risk reduction strategies discussed . patient will not experience additional falls . patient will verbalize understanding of plan for safety and increasing strength Interventions:  . Collaboration with  Crecencio Mc, MD regarding development and update of comprehensive plan of care as evidenced by provider attestation and co-signature . Inter-disciplinary care team collaboration (see longitudinal plan of care) . Discussed fall precaution and Fall prevention strategies . Reviewed medications and discussed potential side effects of medications such as dizziness and frequent urination . Assessed patients knowledge of fall risk prevention and discussed increasing activity slowly as tolerated . Acknowledged her feelings of apprehension and reassured her physical therapist would be there to help her and give tips . Discussed possible medical reasons for decrease in strength (heart failure, mitral valve problems) . Encouraged patient to continue to monitor blood pressures daily monitoring for hypotension and prevention of falls . Maximum independence promoted as safely as can providing self care and seeking assistance from home aide as necessary; instructed do not push herself to do things that feel unsafe or that she is not used to doing alone . Allowed patient to verbalize her feelings of frustration with change of activity level she was previously used to and offered support . Energy conservation techniques promoted . Emotional support and empathy provided to patient . Discussed with patient to prepare for rehabilitation services to develop an individualized activity and exercise  program as indicated, such as progressive resistance strengthening, balance and activity tolerance training or home exercise program . Promoted gradual increase in intensity of activity and exercise as tolerated, such as duration, frequency, exercise repetition and sets and intensity Patient Goals/Self-Care Deficits:   . Consider resuming home therapy . Use cane with walking, fall precautions, change positions slowly . Start slowly and increase the amount of time for working out as tolerated  . Do not do any activity that  does not feel safe or that you normally would not do on your own (wait for some assistance from family or home aid) . Continue with walking for 5 minutes daily prior to surgery Follow Up Plan: The care management team will reach out to the patient again over the next 20 business days.      Plan:The care management team will reach out to the patient again over the next 30 business days.  [name] RN, MSN RN Care Management Coordinator [place] [phone] [email or web address]

## 2020-11-03 NOTE — Patient Instructions (Addendum)
Visit Information  PATIENT GOALS: Goals Addressed            This Visit's Progress   . (RNCM) Increase My Muscle Strength   On track    Timeframe:  Long-Range Goal Priority:  Medium Start Date:  08/25/2020                           Expected End Date: 02/15/21                      Follow Up Date 11/26/20    . Consider resuming home therapy . Use cane with walking, fall precautions, change positions slowly . Start slowly and increase the amount of time for working out as tolerated  . Do not do any activity that does not feel safe or that you normally would not do on your own (wait for some assistance from family or home aid) . Continue with walking for 5 minutes daily prior to surgery   Why is this important?    Walking and easy exercises make you stronger. These also give you energy.   Every little bit helps, at any age.    Notes:     Marland Kitchen (RNCM) Track and Manage Fluids and Swelling-Heart Failure   On track    Timeframe:  Long-Range Goal Priority:  High Start Date:   08/25/20                          Expected End Date:   02/15/21                    Follow Up Date 11/26/20    . Call office if I gain more than 2 pounds in one day or 5 pounds in one week . Keep legs up/elevated while sitting and wear support hose . Watch for swelling in feet, ankles and legs every day . Weigh myself daily and keep in log to take to provider for review . Follow rescue plan and know when to call the doctor   Why is this important?    It is important to check your weight daily and watch how much salt and liquids you have.   It will help you to manage your heart failure.    Notes:        Patient verbalizes understanding of instructions provided today and agrees to view in [place].   The care management team will reach out to the patient again over the next 30 business days.   [name] RN, MSN RN Care Management Coordinator [place] [phone] [email or web address]   Transcatheter Mitral Valve Repair  Transcatheter mitral valve repair [facility]) is a type of surgery to fix problems with the mitral valve. This is the valve between the upper chamber (atrium) and the lower chamber (ventricle) on the left side of the heart. The mitral valve has two flaps, which are also called leaflets, that open and close so that blood can move from the atrium to the ventricle. The valve only allows blood to flow in one direction. You may need this surgery if the mitral valve is not working properly or has an infection. It may not open far enough, not close all the way, or have other problems. Transcatheter means the surgery is done using a long, thin tube (catheter) to reach the valve, instead of traditional open heart surgery. The valve is repaired by  placing a device or clip onto the diseased valve. The device improves the blood flow through the valve and prevents leaking. Tell a health care provider about:  Any allergies you have.  All medicines you are taking, including vitamins, herbs, eye drops, creams, and over-the-counter medicines.  Any problems you or family members have had with anesthetic medicines.  Any blood disorders you have.  Any surgeries you have had.  Any medical conditions you have.  Whether you are pregnant or may be pregnant. What are the risks? Generally, this is a safe procedure. However, problems may occur, including:  Infection.  Bleeding.  Allergic reactions to medicines or dyes.  Damage to nearby structures or organs.  Blood clots.  Breathing problems.  Heart attack.  Stroke.  Kidney problems.  Irregular heartbeat.  The device failing or not working properly.  Needing another procedure to close a Holberg created in the heart to reach the mitral valve. What happens before the procedure? Staying hydrated Follow instructions from your health care provider about hydration,  which may include:  Up to 2 hours before the procedure - you may continue to drink clear liquids, such as water, clear fruit juice, black coffee, and plain tea. Eating and drinking restrictions Follow instructions from your health care provider about eating and drinking, which may include:  8 hours before the procedure - stop eating heavy meals or foods, such as meat, fried foods, or fatty foods.  6 hours before the procedure - stop eating light meals or foods, such as toast or cereal.  6 hours before the procedure - stop drinking milk or drinks that contain milk.  2 hours before the procedure - stop drinking clear liquids. Medicines Ask your health care provider about:  Changing or stopping your regular medicines. This is especially important if you are taking diabetes medicines or blood thinners.  Taking medicines such as aspirin and ibuprofen. These medicines can thin your blood. Do not take these medicines unless your health care provider tells you to take them.  Taking over-the-counter medicines, vitamins, herbs, and supplements. General instructions  Do not use any products that contain nicotine or tobacco for at least 4 weeks before the procedure. These products include cigarettes, chewing tobacco, and vaping devices, such as e-cigarettes. If you need help quitting, ask your health care provider.  Ask your health care provider: ? How your surgery site will be marked. ? What steps will be taken to help prevent infection. These steps may include:  Removing hair at the surgery site.  Washing skin with a germ-killing soap.  Taking antibiotic medicine.  Plan to have a responsible adult take you home from the hospital or clinic.  Plan to have a responsible adult care for you for the time you are told after you leave the hospital or clinic. This is important. What happens during the procedure?  An IV will be inserted into one of your veins.  You will be given one or more of  the following: ? A medicine to help you relax (sedative). ? A medicine to make you fall asleep (general anesthetic).  You will have a breathing tube inserted in your throat to help you breathe. It will be connected to a machine (ventilator).  An incision will be made in your groin area.  A thin tube with a device called a transducer will be inserted down your part of the body that moves food from your mouth to your stomach (esophagus), and a heart ultrasound (transesophagealechocardiogram) will be  used to help guide the surgery. An ultrasound uses sound waves to create a picture of the heart.  A guide wire will be put through the incision into a vein. It will be threaded up to your heart to reach the mitral valve.  A catheter will be put over the guide wire through the vein. It will be moved up to your heart to reach the mitral valve.  The guide wire will be removed.  A metal clip or other device will be moved through the catheter into your heart. It will be used to reshape the valve, fix holes or tears, or fix the flaps. The clip or device will stay in your heart.  The catheter will be removed.  Your breathing tube and ventilator may need to stay in place while you recover, or they may be removed in the surgery room. This will depend on how well you are breathing.  A catheter may be placed to drain urine from your bladder (urinary catheter).  Small stitches (sutures) will be used to close the incision.  A bandage (dressing) may be placed over the incision. The procedure may vary among health care providers and hospitals.   What happens after the procedure?  Your blood pressure, heart rate, breathing rate, and blood oxygen level will be monitored until you leave the hospital or clinic.  You may be given medicine for pain.  You may continue to receive fluids through an IV.  You may need to keep using a breathing tube and ventilator.  You will spend at least one night in the  hospital.  If you were given a sedative during the procedure, it can affect you for several hours. Do not drive or operate machinery until your health care provider says that it is safe. Summary  Transcatheter mitral valve repair is a type of surgery that involves placing a device or clip to fix the mitral valve in your heart.  Follow instructions from your health care provider about eating and drinking before the procedure.  Plan to have a responsible adult care for you for the time you are told after you leave the hospital or clinic. This information is not intended to replace advice given to you by your health care provider. Make sure you discuss any questions you have with your health care provider. Document Revised: 02/25/2020 Document Reviewed: 02/25/2020 Elsevier Patient Education  2021 [employer].  Transcatheter Mitral Valve Repair, Care After The following information offers guidance on how to care for yourself after your procedure. Your health care provider may also give you more specific instructions. If you have problems or questions, contact your health care provider. What can I expect after the procedure? After the procedure, it is common to have:  Mild pain or soreness at your incision site.  Tiredness (fatigue). Follow these instructions at home: Medicines  Take over-the-counter and prescription medicines only as told by your health care provider. You may be prescribed a blood thinning medicine if you were not already on one.  If you were prescribed an antibiotic medicine, take it as told by your health care provider. Do not stop taking the antibiotic even if you start to feel better.  Ask your health care provider if the medicine prescribed to you requires you to avoid driving or using machinery.  Ask your health care provider if you need to take antibiotics prior to any dental work.  If you are taking blood thinners: ? Talk with your health care provider before  taking any medicines that contain aspirin or NSAIDs, such as ibuprofen. These medicines increase your risk for dangerous bleeding. ? Take your medicine exactly as told, at the same time every day. ? Avoid activities that could cause injury or bruising, and follow instructions about how to prevent falls. ? Wear a medical alert bracelet or carry a card that lists what medicines you take. Incision care  Follow instructions from your health care provider about how to take care of your incision. Make sure you: ? Wash your hands with soap and water for at least 20 seconds before and after you change your bandage (dressing). If soap and water are not available, use hand sanitizer. ? Change your dressing as told by your health care provider. ? Leave stitches (sutures), skin glue, or adhesive strips in place. These skin closures may need to stay in place for 2 weeks or longer. If adhesive strip edges start to loosen and curl up, you may trim the loose edges. Do not remove adhesive strips completely unless your health care provider tells you to do that.  Check your incision area every day for signs of infection. Check for: ? Redness, swelling, or more pain. ? Fluid or blood. ? Warmth. ? Pus or a bad smell. ? A lump or bump. Activity  Do not lift anything that is heavier than 10 lb (4.5 kg), or the limit that you are told, until your health care provider says that it is safe. This is typically for 1 week.  Avoid activities that take a lot of effort, such as exercise or sports, as told by your health care provider.  Avoid sexual activity until your health care provider says that it is safe.  Return to your normal activities as told by your health care provider. Ask your health care provider what activities are safe for you. Lifestyle  Do not use any products that contain nicotine or tobacco. These products include cigarettes, chewing tobacco, and vaping devices, such as e-cigarettes. These can delay  healing after surgery. If you need help quitting, ask your health care provider.  Avoid drinking alcohol. General instructions  Do not take baths, swim, shower, or use a hot tub until your health care provider approves. You may only be allowed to take sponge baths.  Drink enough fluid to keep your urine pale yellow.  Follow instructions from your health care provider about eating or drinking restrictions. To prevent heart problems, you may need to follow a low-salt (low-sodium), low-fat diet.  Weigh yourself daily and monitor your weight for any changes.  Keep all follow-up visits. This is important.   Contact a health care provider if:  You have severe pain, and medicines do not help.  You have any of these signs of infection: ? A fever. ? Redness, swelling, or more pain in your incision area. ? Fluid or blood coming from your incision. ? Warmth coming from your incision. ? Pus or a bad smell coming from your incision area. ? A lump or bump at your incision area.  You have a sudden change in your weight.  You feel abnormal heartbeats. Get help right away if:  You have chest pain.  You have problems breathing.  You feel dizzy or you faint.  You have any symptoms of a stroke. "BE FAST" is an easy way to remember the main warning signs of a stroke: ? B - Balance. Signs are dizziness, sudden trouble walking, or loss of balance. ? E - Eyes. Signs are  trouble seeing or a sudden change in vision. ? F - Face. Signs are sudden weakness or numbness of the face, or the face or eyelid drooping on one side. ? A - Arms. Signs are weakness or numbness in an arm. This happens suddenly and usually on one side of the body. ? S - Speech. Signs are sudden trouble speaking, slurred speech, or trouble understanding what people say. ? T - Time. Time to call emergency services. Write down what time symptoms started.  You have other signs of a stroke, such as: ? A sudden, severe headache with  no known cause. ? Nausea or vomiting. ? Seizure. These symptoms may represent a serious problem that is an emergency. Do not wait to see if the symptoms will go away. Get medical help right away. Call your local emergency services (911 in the [place] Do not drive yourself to the hospital.   Summary  After the procedure, it is common to have mild pain and soreness at your incision site.  Check your incision area every day for signs of infection.  Take over-the-counter and prescription medicines only as told by your health care provider.  Keep all follow-up visits. This is important. This information is not intended to replace advice given to you by your health care provider. Make sure you discuss any questions you have with your health care provider. Document Revised: 02/25/2020 Document Reviewed: 02/25/2020 Elsevier Patient Education  2021 [employer].

## 2020-12-14 HISTORY — PX: MITRAL VALVE REPAIR: SHX2039

## 2020-12-15 ENCOUNTER — Telehealth: Payer: Self-pay | Admitting: Internal Medicine

## 2020-12-15 NOTE — Telephone Encounter (Signed)
Are you okay with me scheduling her a hospital follow up in a 4:30 slot?

## 2020-12-15 NOTE — Telephone Encounter (Signed)
TCM to be completed as appropriate.

## 2020-12-15 NOTE — Telephone Encounter (Signed)
Patient is being released from [facility] to home today. Patient had a procedure on her heart, while in hospital she was diagnosed with type 2 diabetes. She needs a hospital follow up in 10 days, at the time of call no available appointments.

## 2020-12-16 ENCOUNTER — Telehealth: Payer: Self-pay

## 2020-12-16 DIAGNOSIS — Z8639 Personal history of other endocrine, nutritional and metabolic disease: Secondary | ICD-10-CM

## 2020-12-16 NOTE — Telephone Encounter (Signed)
Dr. Derrel Nip does not have any 4:30 appointments available for the next 2 weeks. I need another time, please.

## 2020-12-16 NOTE — Telephone Encounter (Signed)
Transition Care Management Unsuccessful Follow-up Telephone Call  Date of discharge and from where:  12/15/20 from [facility]  Attempts:  1st Attempt  Reason for unsuccessful TCM follow-up call:  Left voice message

## 2020-12-16 NOTE — Telephone Encounter (Signed)
July 13th there is a 4:30 that is on hold for the pt.

## 2020-12-17 NOTE — Telephone Encounter (Signed)
Transition Care Management Follow-up Telephone Call Date of discharge and from where: 12/15/20 from [facility]. How have you been since you were released from the hospital? Denies SOB, chest pain, dizziness, N/V/D. Overall swelling decreased. Monitoring sugar intake.  Any questions or concerns? None at this time.   Items Reviewed: Did the pt receive and understand the discharge instructions provided? Yes Medications obtained and verified? Yes  Other? No  Any new allergies since your discharge? No  Dietary orders reviewed? Yes Do you have support at home? Yes   Home Care and Equipment/Supplies: Were home health services ordered? Yes. HHPT. Start of care with [facility] 12/22/20.  Functional Questionnaire: (I = Independent and D = Dependent) ADLs: Daughter assists as needed.  Maintaining continence- I  Transferring/Ambulation- rollator walker DME with [place].   Managing Meds- I  Follow up appointments reviewed:  PCP Hospital f/u appt confirmed? Yes  Scheduled to see Dr. Derrel Nip on 12/29/20 @ 10:30. Potassium lab and appointment scheduled 12/21/20 @ 2:15. [facility] f/u appt confirmed? None at this time.  Are transportation arrangements needed? No  If their condition worsens, is the pt aware to call PCP or go to the Emergency Dept.? Yes Was the patient provided with contact information for the PCP's office or ED? Yes Was to pt encouraged to call back with questions or concerns? Yes

## 2020-12-21 ENCOUNTER — Other Ambulatory Visit: Payer: Self-pay

## 2020-12-21 ENCOUNTER — Other Ambulatory Visit (INDEPENDENT_AMBULATORY_CARE_PROVIDER_SITE_OTHER): Payer: Medicare Other

## 2020-12-21 DIAGNOSIS — Z8639 Personal history of other endocrine, nutritional and metabolic disease: Secondary | ICD-10-CM | POA: Diagnosis not present

## 2020-12-21 LAB — POTASSIUM: Potassium: 4.1 mEq/L (ref 3.5–5.1)

## 2020-12-24 ENCOUNTER — Ambulatory Visit: Payer: Medicare Other | Admitting: *Deleted

## 2020-12-24 DIAGNOSIS — I10 Essential (primary) hypertension: Secondary | ICD-10-CM

## 2020-12-24 DIAGNOSIS — R5383 Other fatigue: Secondary | ICD-10-CM

## 2020-12-24 DIAGNOSIS — I429 Cardiomyopathy, unspecified: Secondary | ICD-10-CM

## 2020-12-24 DIAGNOSIS — I059 Rheumatic mitral valve disease, unspecified: Secondary | ICD-10-CM

## 2020-12-24 DIAGNOSIS — I428 Other cardiomyopathies: Secondary | ICD-10-CM

## 2020-12-24 NOTE — Chronic Care Management (AMB) (Signed)
Chronic Care Management   CCM RN Visit Note  12/24/2020 Name: Linda [name] MRN: [id number] DOB: March 14, 1934  Subjective: Linda [name] is a 87 y.o. year old female who is a primary care patient of Crecencio Mc, MD. The care management team was consulted for assistance with disease management and care coordination needs.    Engaged with patient by telephone for follow up visit in response to provider referral for case management and/or care coordination services.   Consent to Services:  The patient was given information about Chronic Care Management services, agreed to services, and gave verbal consent prior to initiation of services.  Please see initial visit note for detailed documentation.   Patient agreed to services and verbal consent obtained.   Assessment: Review of patient past medical history, allergies, medications, health status, including review of consultants reports, laboratory and other test data, was performed as part of comprehensive evaluation and provision of chronic care management services.   SDOH (Social Determinants of Health) assessments and interventions performed:    CCM Care Plan  Allergies  Allergen Reactions   Barium Iodide     Unknown   Crestor [Rosuvastatin Calcium] Other (See Comments)    Myalgia    Penicillins Rash   Sulfa Antibiotics Rash   Sulfonamide Derivatives Rash    Outpatient Encounter Medications as of 12/24/2020  Medication Sig Note   ELIQUIS 5 MG TABS tablet Take 1 tablet (5 mg total) by mouth 2 (two) times daily.    furosemide (LASIX) 20 MG tablet Take 40 mg by mouth daily. 11/03/2020: Reports taking 1 tablet daily   losartan (COZAAR) 25 MG tablet Take 1 tablet (25 mg total) by mouth daily. (Patient taking differently: Take 12.5 mg by mouth at bedtime.) 08/25/2020: Reports taking 1/2 tablet nightly   metoprolol tartrate (LOPRESSOR) 25 MG tablet Take by mouth. 12/24/2020: Reports medication makes her not feel well (weak and faint), so she  takes medication occasionally 1/2 tablet   spironolactone (ALDACTONE) 25 MG tablet Take 1 tablet by mouth daily.    traZODone (DESYREL) 50 MG tablet Take 50 mg by mouth at bedtime.    acetaminophen (TYLENOL) 500 MG tablet Take 500 mg by mouth every 6 (six) hours as needed for moderate pain or headache. (Patient not taking: Reported on 09/29/2020)    cetirizine (ZYRTEC) 10 MG tablet Take 10 mg by mouth daily as needed for allergies.    chlorthalidone (HYGROTON) 25 MG tablet Take 25 mg by mouth daily. (Patient not taking: Reported on 12/24/2020) 12/24/2020: Medication discontinued after surgery   Cholecalciferol (VITAMIN D) 2000 units tablet Take 2,000 Units by mouth daily. (Patient not taking: Reported on 10/18/2020)    clindamycin (CLEOCIN) 300 MG capsule Take 300 mg by mouth 2 (two) times daily. 12/24/2020: As needed for Dental procedures post surgery   estradiol (ESTRACE) 0.1 MG/GM vaginal cream Place 1 g vaginally 3 (three) times a week. Pea sized amount per urethra (Patient not taking: No sig reported)    Ketotifen Fumarate (ALLERGY EYE DROPS OP) Place 1 drop into both eyes daily as needed (allergies).    LORazepam (ATIVAN) 0.5 MG tablet Take 1 tablet (0.5 mg total) by mouth at bedtime as needed for anxiety. (Patient not taking: No sig reported)    potassium chloride (KLOR-CON) 10 MEQ tablet Take 10-20 mEq by mouth daily. (Patient not taking: Reported on 12/24/2020) 12/24/2020: Medication discontinued after surgery   No facility-administered encounter medications on file as of 12/24/2020.    Patient Active  Problem List   Diagnosis Date Noted   Proximal muscle weakness 08/24/2020   Mitral valve disease 07/09/2020   Fatigue 05/29/2020   Hypokalemia 12/16/2019   Myalgia due to statin 12/15/2019   Acquired thrombophilia ([place]) 12/15/2019   Skin lesion of hand 08/27/2018   Hospital discharge follow-up 05/25/2018   History of CVA (cerebrovascular accident) without residual deficits 05/10/2018   Parent-child  estrangement nec 02/09/2018   Basal cell carcinoma (BCC) of right lower extremity 08/08/2017   Hematuria, gross 08/08/2017   Extremity atherosclerosis with intermittent claudication ([place]) 08/08/2017   Generalized anxiety disorder 01/31/2017   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   Controlled type 2 diabetes mellitus with microalbuminuria, without long-term current use of insulin ([place]) 01/20/2016   Insomnia secondary to anxiety 10/09/2015   Cardiomyopathy, idiopathic ([place]) 09/03/2015   Polycythemia, secondary 07/04/2015   Pre-syncope 07/02/2015   Dysuria 12/28/2014   Osteopenia 08/21/2014   Gastritis 05/26/2014   GERD (gastroesophageal reflux disease) 03/04/2014   Medicare annual wellness visit, subsequent 01/13/2014   Essential hypertension 11/04/2013   Screening for breast cancer 07/01/2013   Left shoulder pain 03/21/2013   Hoarseness of voice 03/21/2013   Edema 09/05/2010   VENTRICULAR HYPERTROPHY, LEFT 08/16/2010   Hyperlipidemia 06/03/2010   ATRIAL FIBRILLATION 08/13/2009    Conditions to be addressed/monitored:CHF and Weakness  Care Plan : Heart Failure  Updates made by Leona Singleton, RN since 12/24/2020 12:00 AM     Problem: Heart Failure (increasing edema and shortness of breath   Priority: High     Long-Range Goal: Symptom Exacerbation Prevented or Minimized   Start Date: 08/25/2020  Expected End Date: 06/17/2021  This Visit's Progress: On track  Recent Progress: Not on track  Priority: High  Note:   Current Barriers:  Knowledge deficits related to basic heart failure pathophysiology and self care management as evidenced by increasing shortness of breath with exertion and increase in lower extremity edema.  Patient underwent mitral valve clipping end of June.  Discharged from [facility] on 12/15/20.  Patient reporting she feels much better.  Denies any shortness of breath and reports lower extremity edema is very minor (thinks it is now related to varicose veins).  Weight  this morning was 162.5 (was up to about 180 prior to surgery per patient).  Is happy with the post surgery outcome.  Does report she feels her Metoprolol dose is too much, states she feels light headed and faint some times when she takes this medication.  Denies falls.  Recent blood pressures have ranged 100-110/60-70's.  Patient stating she usually does not tolerate blood pressures below 299'B systolic Unable to perform ADLs independently Unable to perform IADLs independently Financial strain-patient reporting her Eliquis copay has increased to $100 monthly.   Nurse Case Manager Clinical Goal(s):  Patient will report no hospitalization related to heart failure exacerbation within the next 90 days. patient will weigh self daily and record patient will verbalize understanding of Heart Failure Action Plan and when to call doctor patient will take all Heart Failure mediations as prescribed Patient will continue to monitor heart rates and notify provider if elevated heart rates sustains Interventions:  Collaboration with Crecencio Mc, MD regarding development and update of comprehensive plan of care as evidenced by provider attestation and co-signature Inter-disciplinary care team collaboration (see longitudinal plan of care) Basic overview and discussion of pathophysiology of Heart Failure Reviewed Heart Failure Action Plan in depth and provided written copy; encouraged patient to review Encouraged continued daily weight monitoring, documenting  in log for provider review Reviewed medications and encouraged medication compliance; discussed timing of taking second fluid pill  Reviewed role of diuretics in prevention of fluid overload and dosage of Lasix (patient verbalized her understanding and states taking 1 tablets daily) Discussed increased heart rates and atrial fibrillation and encouraged patient to continue to monitor feelings of rapid rates and to notify provider if rates sustains Encouraged  to attend scheduled upcoming medical appointments Self-awareness of signs/symptoms of worsening disease encouraged Sent heart failure zones and action plan education; sent EMMI Video for Atrial Fibrillation and Heart Failure rest and exercise and encouraged patient to review Encouraged patient to continue to use 2022 Calendar Booklet for documentation of vital signs Encouraged patient to contact surgeon, Dr. Mina Marble to discuss medications (Metoprolol) dosing and how she is feeling Reviewed medication changes post surgery and encouraged medication compliance; encouraged patient to discuss medications with PCP and Cardiology Patient Goals/Self-Care Activities:  Call office if I gain more than 2 pounds in one day or 5 pounds in one week Keep legs up/elevated while sitting and wear support hose Watch for swelling in feet, ankles and legs every day Weigh myself daily and keep in log to take to provider for review Follow rescue plan and know when to call the doctor Continue to monitor blood pressures daily taking log to provider review/monitoring for light headiness Follow Up Plan: The care management team will reach out to the patient again over the next 30 business days.       Care Plan : Increase in weakness limiting patient independence  Updates made by Leona Singleton, RN since 12/24/2020 12:00 AM     Problem: Functional Decline   Priority: Medium     Long-Range Goal: Functional Decline Minimized over the next 90 days   Start Date: 08/25/2020  Expected End Date: 02/15/2021  This Visit's Progress: On track  Recent Progress: On track  Priority: Medium  Note:   Current Barriers:  Knowledge Deficits related to increasing strength in patient with declining functional ability as evidenced by patient reporting she has little energy and shortness of breath with minimal activity.  Patient states she feels a lot better after surgery. Discharged from hospital on 12/15/20.  Is participating with home  health therapy.  Ambulating with cane.  Denies any falls. Unable to perform ADLs independently Unable to perform IADLs independently Care Coordination needs related to home health physical therapy  in a patient with decreasing strength Clinical Goal(s):  patient will demonstrate improved adherence to prescribed treatment plan for decreasing falls as evidenced by patient reporting and review of EMR patient will verbalize using fall risk reduction strategies discussed patient will not experience additional falls patient will verbalize understanding of plan for safety and increasing strength Interventions:  Collaboration with Crecencio Mc, MD regarding development and update of comprehensive plan of care as evidenced by provider attestation and co-signature Inter-disciplinary care team collaboration (see longitudinal plan of care) Discussed fall precaution and Fall prevention strategies Reviewed medications and discussed potential side effects of medications such as dizziness and frequent urination Assessed patients knowledge of fall risk prevention and discussed increasing activity slowly as tolerated Acknowledged her feelings of apprehension and reassured her physical therapist would be there to help her and give tips Discussed possible medical reasons for decrease in strength (heart failure, mitral valve problems) Encouraged patient to continue to monitor blood pressures daily monitoring for hypotension and prevention of falls Maximum independence promoted as safely as can providing self care and  seeking assistance from home aide as necessary; instructed do not push herself to do things that feel unsafe or that she is not used to doing alone Encouraged to continue to work with home health therapy Instructed and encouraged to use cane with all ambulation Energy conservation techniques promoted Emotional support and empathy provided to patient Discussed with patient to prepare for  rehabilitation services to develop an individualized activity and exercise program as indicated, such as progressive resistance strengthening, balance and activity tolerance training or home exercise program Promoted gradual increase in intensity of activity and exercise as tolerated, such as duration, frequency, exercise repetition and sets and intensity Patient Goals/Self-Care Deficits:   Continue to work with home health therapy Use cane with walking, fall precautions, change positions slowly Start slowly and increase the amount of time for working out as tolerated  Do not do any activity that does not feel safe or that you normally would not do on your own (wait for some assistance from family or home aid) Follow Up Plan: The care management team will reach out to the patient again over the next 20 business days.        Plan:The care management team will reach out to the patient again over the next 20 business days.  [name] RN, MSN RN Care Management Coordinator [place] [phone] [email or web address]

## 2020-12-24 NOTE — Patient Instructions (Signed)
Visit Information  PATIENT GOALS:  Goals Addressed             This Visit's Progress    (RNCM) Increase My Muscle Strength   On track    Timeframe:  Long-Range Goal Priority:  Medium Start Date:  08/25/2020                           Expected End Date: 05/18/21                      Follow Up Date 01/05/21    Continue to work with home health therapy Use cane with walking, fall precautions, change positions slowly Start slowly and increase the amount of time for working out as tolerated  Do not do any activity that does not feel safe or that you normally would not do on your own (wait for some assistance from family or home aid)   Why is this important?   Walking and easy exercises make you stronger. These also give you energy.  Every little bit helps, at any age.    Notes:       (RNCM) Track and Manage Fluids and Swelling-Heart Failure   On track    Timeframe:  Long-Range Goal Priority:  High Start Date:   08/25/20                          Expected End Date:   06/17/21                    Follow Up Date 01/05/21    Call office if I gain more than 2 pounds in one day or 5 pounds in one week Keep legs up/elevated while sitting and wear support hose Watch for swelling in feet, ankles and legs every day Weigh myself daily and keep in log to take to provider for review Follow rescue plan and know when to call the doctor Continue to monitor blood pressures daily taking log to provider review/monitoring for light headiness   Why is this important?   It is important to check your weight daily and watch how much salt and liquids you have.  It will help you to manage your heart failure.    Notes:          The patient verbalized understanding of instructions, educational materials, and care plan provided today and declined offer to receive copy of patient instructions, educational materials, and care plan.   The care management team will reach out to the patient again over the  next 20 business days.   [name] RN, MSN RN Care Management Coordinator [place] [phone] [email or web address]

## 2020-12-29 ENCOUNTER — Other Ambulatory Visit: Payer: Self-pay

## 2020-12-29 ENCOUNTER — Ambulatory Visit (INDEPENDENT_AMBULATORY_CARE_PROVIDER_SITE_OTHER): Payer: Medicare Other | Admitting: Internal Medicine

## 2020-12-29 ENCOUNTER — Encounter: Payer: Self-pay | Admitting: Internal Medicine

## 2020-12-29 DIAGNOSIS — D6869 Other thrombophilia: Secondary | ICD-10-CM

## 2020-12-29 DIAGNOSIS — Z09 Encounter for follow-up examination after completed treatment for conditions other than malignant neoplasm: Secondary | ICD-10-CM

## 2020-12-29 DIAGNOSIS — E1129 Type 2 diabetes mellitus with other diabetic kidney complication: Secondary | ICD-10-CM | POA: Diagnosis not present

## 2020-12-29 DIAGNOSIS — I059 Rheumatic mitral valve disease, unspecified: Secondary | ICD-10-CM

## 2020-12-29 DIAGNOSIS — I4811 Longstanding persistent atrial fibrillation: Secondary | ICD-10-CM

## 2020-12-29 DIAGNOSIS — I10 Essential (primary) hypertension: Secondary | ICD-10-CM

## 2020-12-29 DIAGNOSIS — R600 Localized edema: Secondary | ICD-10-CM | POA: Diagnosis not present

## 2020-12-29 DIAGNOSIS — R809 Proteinuria, unspecified: Secondary | ICD-10-CM

## 2020-12-29 NOTE — Progress Notes (Signed)
Subjective:  Patient ID: Linda [name], female    DOB: 1933/09/08  Age: 87 y.o. MRN: [id number]  CC: Diagnoses of Acquired thrombophilia ([place]), Longstanding persistent atrial fibrillation ([place]), Controlled type 2 diabetes mellitus with microalbuminuria, without long-term current use of insulin ([place]), Localized edema, Essential hypertension, Mitral valve disease, and Hospital discharge follow-up were pertinent to this visit.  HPI Linda [name] presents for hospital follow up  This visit occurred during the SARS-CoV-2 public health emergency.  Safety protocols were in place, including screening questions prior to the visit, additional usage of staff PPE, and extensive cleaning of exam room while observing appropriate contact time as indicated for disinfecting solutions.    Linda [name] is an 87 yr old female with type 2 DM,  atrial fibrillation and recent progression of mitral insufficiency resulting in NYHA Class 3 HF who was admitted to [facility] o August 28 for elective  Mitra-Clip placement She had 2 clips successfully placed on 12/14/2020. During her admission, her chlorthalidone was stopped due to hypokalemia and she was started on spironolactone  . She was also started on  low dose of metoprolol for history of atrial fibrillation and hypertension .  She was advised to continue  lasix  and resume  apixiban  on the night of discharge,  6/29.    hhe Has been taking 20 mg furosemide in the morning  and 25 spironlactone at night,  but has reduced her dose metoprolol to 12.5 mg due to recurrent hypotension and is taking 1/2 losartan at night   She has had recurrent pain iin both arms  but denies chest pain. Getting her strength back and notes that all dyspnea noted prior to surgery with ADLs has resolved.  She has had a repeat BMET and her Potassium was normal on July 5   Outpatient Medications Prior to Visit  Medication Sig Dispense Refill   acetaminophen (TYLENOL) 500 MG tablet Take 500 mg by mouth every  6 (six) hours as needed for moderate pain or headache.     cetirizine (ZYRTEC) 10 MG tablet Take 10 mg by mouth daily as needed for allergies.     Cholecalciferol (VITAMIN D) 2000 units tablet Take 2,000 Units by mouth daily.     clindamycin (CLEOCIN) 300 MG capsule Take 300 mg by mouth 2 (two) times daily.     ELIQUIS 5 MG TABS tablet Take 1 tablet (5 mg total) by mouth 2 (two) times daily. 60 tablet 0   estradiol (ESTRACE) 0.1 MG/GM vaginal cream Place 1 g vaginally 3 (three) times a week. Pea sized amount per urethra 42.5 g 12   furosemide (LASIX) 20 MG tablet Take 40 mg by mouth daily.     Ketotifen Fumarate (ALLERGY EYE DROPS OP) Place 1 drop into both eyes daily as needed (allergies).     LORazepam (ATIVAN) 0.5 MG tablet Take 1 tablet (0.5 mg total) by mouth at bedtime as needed for anxiety. 30 tablet 0   losartan (COZAAR) 25 MG tablet Take 1 tablet (25 mg total) by mouth daily. (Patient taking differently: Take 12.5 mg by mouth at bedtime.) 90 tablet 1   metoprolol tartrate (LOPRESSOR) 25 MG tablet Take by mouth.     potassium chloride (KLOR-CON) 10 MEQ tablet Take 10-20 mEq by mouth daily.     spironolactone (ALDACTONE) 25 MG tablet Take 1 tablet by mouth daily.     traZODone (DESYREL) 50 MG tablet Take 50 mg by mouth at bedtime.     chlorthalidone (  HYGROTON) 25 MG tablet Take 25 mg by mouth daily. (Patient not taking: No sig reported)     No facility-administered medications prior to visit.    Review of Systems;  Patient denies headache, fevers, malaise, unintentional weight loss, skin rash, eye pain, sinus congestion and sinus pain, sore throat, dysphagia,  hemoptysis , cough, dyspnea, wheezing, chest pain, palpitations, orthopnea, edema, abdominal pain, nausea, melena, diarrhea, constipation, flank pain, dysuria, hematuria, urinary  Frequency, nocturia, numbness, tingling, seizures,  Focal weakness, Loss of consciousness,  Tremor, insomnia, depression, anxiety, and suicidal ideation.       Objective:  BP 124/86 (BP Location: Left Arm, Patient Position: Sitting, Cuff Size: Normal)   Pulse 76   Temp (!) 95.3 F (35.2 C) (Temporal)   Resp 16   Ht 5' 3.5" (1.613 m)   Wt 164 lb 12.8 oz (74.8 kg)   SpO2 96%   BMI 28.74 kg/m   BP Readings from Last 3 Encounters:  12/29/20 124/86  09/29/20 125/75  08/23/20 120/78    Wt Readings from Last 3 Encounters:  12/29/20 164 lb 12.8 oz (74.8 kg)  09/29/20 176 lb (79.8 kg)  08/23/20 181 lb (82.1 kg)    General appearance: alert, cooperative and appears stated age Ears: normal TM's and external ear canals both ears Throat: lips, mucosa, and tongue normal; teeth and gums normal Neck: no adenopathy, no carotid bruit, supple, symmetrical, trachea midline and thyroid not enlarged, symmetric, no tenderness/mass/nodules Back: symmetric, no curvature. ROM normal. No CVA tenderness. Lungs: clear to auscultation bilaterally Heart: regular rate and rhythm, S1, S2 normal, no murmur, click, rub or gallop Abdomen: soft, non-tender; bowel sounds normal; no masses,  no organomegaly Pulses: 2+ and symmetric Skin: Skin color, texture, turgor normal. No rashes or lesions Lymph nodes: Cervical, supraclavicular, and axillary nodes normal.  Lab Results  Component Value Date   HGBA1C 7.2 (H) 09/09/2020   HGBA1C 7.1 (H) 05/27/2020   HGBA1C 6.6 (H) 12/15/2019    Lab Results  Component Value Date   CREATININE 0.85 09/09/2020   CREATININE 0.93 05/27/2020   CREATININE 0.91 12/15/2019    Lab Results  Component Value Date   WBC 5.8 05/27/2020   HGB 15.7 (H) 05/27/2020   HCT 47.1 (H) 05/27/2020   PLT 196.0 05/27/2020   GLUCOSE 160 (H) 09/09/2020   CHOL 145 09/09/2020   TRIG 63.0 09/09/2020   HDL 53.70 09/09/2020   LDLDIRECT 90.0 10/23/2016   LDLCALC 79 09/09/2020   ALT 22 09/09/2020   AST 26 09/09/2020   NA 142 09/09/2020   K 4.1 12/21/2020   CL 99 09/09/2020   CREATININE 0.85 09/09/2020   BUN 22 09/09/2020   CO2 35 (H)  09/09/2020   TSH 1.91 05/27/2020   INR 1.78 05/11/2018   HGBA1C 7.2 (H) 09/09/2020   MICROALBUR 4.9 (H) 09/10/2020    ECHO TEE  Result Date: 09/29/2020    TRANSESOPHOGEAL ECHO REPORT   Patient Name:   FELICITAS SINE Date of Exam: 09/29/2020 Medical Rec #:  [id number]     Height:       63.5 in Accession #:    [id number]    Weight:       176.0 lb Date of Birth:  [birth year]     BSA:          1.842 m Patient Age:    47 years      BP:           108/72 mmHg Patient Gender: F  HR:           79 bpm. Exam Location:  [facility] Procedure: Transesophageal Echo, Color Doppler, Cardiac Doppler and Saline            Contrast Bubble Study Indications:     Mitral valve disease  History:         Patient has prior history of Echocardiogram examinations, most                  recent 05/11/2018. Stroke and TIA.  Sonographer:     Sherrie Sport RDCS (AE) Referring Phys:  [place] Diagnosing Phys: Bartholome Bill MD PROCEDURE: TEE procedure time was 15 minutes. The transesophogeal probe was passed without difficulty through the esophogus of the patient. Imaged were obtained with the patient in a left lateral decubitus position. Sedation performed by different physician. The patient was monitored while under deep sedation. Anesthestetic sedation was provided intravenously by Anesthesiology: 60mg  of Propofol. Image quality was excellent. The patient developed no complications during the procedure. IMPRESSIONS  1. Left ventricular ejection fraction, by estimation, is 50 to 55%. The left ventricle has low normal function. The left ventricle has no regional wall motion abnormalities.  2. Right ventricular systolic function is normal. The right ventricular size is normal.  3. Left atrial size was mildly dilated. No left atrial/left atrial appendage thrombus was detected.  4. The mitral valve is degenerative. Moderate to severe mitral valve regurgitation.  5. The aortic valve is tricuspid. Aortic valve regurgitation is not  visualized. FINDINGS  Left Ventricle: Left ventricular ejection fraction, by estimation, is 50 to 55%. The left ventricle has low normal function. The left ventricle has no regional wall motion abnormalities. The left ventricular internal cavity size was normal in size. Right Ventricle: The right ventricular size is normal. No increase in right ventricular wall thickness. Right ventricular systolic function is normal. Left Atrium: Left atrial size was mildly dilated. No left atrial/left atrial appendage thrombus was detected. Right Atrium: Right atrial size was normal in size. Pericardium: There is no evidence of pericardial effusion. Mitral Valve: The mitral valve is degenerative in appearance. Moderate to severe mitral valve regurgitation, with centrally-directed jet. Tricuspid Valve: The tricuspid valve is grossly normal. Tricuspid valve regurgitation is trivial. Aortic Valve: The aortic valve is tricuspid. Aortic valve regurgitation is not visualized. Pulmonic Valve: The pulmonic valve was not well visualized. Pulmonic valve regurgitation is trivial. Aorta: The aortic root is normal in size and structure. IAS/Shunts: No atrial level shunt detected by color flow Doppler. Agitated saline contrast was given intravenously to evaluate for intracardiac shunting. Bartholome Bill MD Electronically signed by Bartholome Bill MD Signature Date/Time: 09/29/2020/12:53:28 PM    Final     Assessment & Plan:   Problem List Items Addressed This Visit       Unprioritized   Acquired thrombophilia ([place])    She is  tolerating apixiban for reduction of embolic stroke risk        ATRIAL FIBRILLATION    Rate is currently controlled on minimal dose of metoprolol        Controlled type 2 diabetes mellitus with microalbuminuria, without long-term current use of insulin ([place])    Has been  Well controlled  For age using low glycemic index diet .    She  has microalbuminuria and cerebrovascular disease  She has developed early  neuropathy type symptoms on the soles of her feet. She is already taking an ARB. She is intolerant of all statins  due to recurrent myalgias  .  Aspirin is relatively contraindicated since she is already taking Eliquis .  She will return for labs   Lab Results  Component Value Date   HGBA1C 7.2 (H) 09/09/2020   Lab Results  Component Value Date   MICROALBUR 4.9 (H) 09/10/2020         Relevant Orders   Hemoglobin A1c   Edema    Improved,    Multifactorial, With diuretics continued by cardiology at discharge:  Spironolactone and furosemide.  Advised to take spironolactone in the morning . May need to reduce diuretic load in the future  Given her loss of 17 lbs and  resolution of mitral insufficiency induced CHF        Essential hypertension    Currently well controlled on 12.5 mg losartan and 12.5 mg metoprolol,  25 mg spironolactone and 40 mg furosemide.         Relevant Orders   Basic metabolic panel   Hospital discharge follow-up    F/U from December 14 2020 admission for CHF secondary to severe mitral insufficiency, s/p mitra clip.  Patient is stable post discharge;  All  Evolving or resolving issues and  questions about discharge plans were discussed at the visit today for hospital follow up. All labs , imaging studies and progress notes from admission were reviewed with patient today         Mitral valve disease    S/p mitra clip x 2 done June 28 at [facility] without complications.          I have discontinued Halima M. Mounce's chlorthalidone. I am also having her maintain her Vitamin D, cetirizine, LORazepam, losartan, Eliquis, furosemide, potassium chloride, traZODone, estradiol, acetaminophen, Ketotifen Fumarate (ALLERGY EYE DROPS OP), clindamycin, metoprolol tartrate, and spironolactone.  No orders of the defined types were placed in this encounter.   Medications Discontinued During This Encounter  Medication Reason   chlorthalidone (HYGROTON) 25 MG tablet      Follow-up: Return in about 2 months (around 02/28/2021) for follow up diabetes.   Crecencio Mc, MD

## 2020-12-29 NOTE — Patient Instructions (Addendum)
Your blood pressure is too low.  This can make you dizzy and tired   Suspend losartan completely for now   Continue 1/2  metoprolol but take it at bedtime instead  Take your spironolactone in the morning with furosemide  .  Weight yourself every day in the morning  Send me readings of BP and weight in one week

## 2020-12-31 NOTE — Assessment & Plan Note (Addendum)
Has been  Well controlled  For age using low glycemic index diet .    She  has microalbuminuria and cerebrovascular disease  She has developed early neuropathy type symptoms on the soles of her feet. She is already taking an ARB. She is intolerant of all statins   due to recurrent myalgias  .  Aspirin is relatively contraindicated since she is already taking Eliquis .  She will return for labs   Lab Results  Component Value Date   HGBA1C 7.2 (H) 09/09/2020   Lab Results  Component Value Date   MICROALBUR 4.9 (H) 09/10/2020

## 2020-12-31 NOTE — Assessment & Plan Note (Signed)
S/p mitra clip x 2 done June 28 at [facility] without complications.

## 2020-12-31 NOTE — Assessment & Plan Note (Signed)
Currently well controlled on 12.5 mg losartan and 12.5 mg metoprolol,  25 mg spironolactone and 40 mg furosemide.

## 2020-12-31 NOTE — Assessment & Plan Note (Addendum)
Improved,    Multifactorial, With diuretics continued by cardiology at discharge:  Spironolactone and furosemide.  Advised to take spironolactone in the morning . May need to reduce diuretic load in the future  Given her loss of 17 lbs and  resolution of mitral insufficiency induced CHF

## 2020-12-31 NOTE — Assessment & Plan Note (Signed)
She is  tolerating apixiban for reduction of embolic stroke risk

## 2020-12-31 NOTE — Assessment & Plan Note (Signed)
Rate is currently controlled on minimal dose of metoprolol

## 2020-12-31 NOTE — Assessment & Plan Note (Signed)
F/U from December 14 2020 admission for CHF secondary to severe mitral insufficiency, s/p mitra clip.  Patient is stable post discharge;  All  Evolving or resolving issues and  questions about discharge plans were discussed at the visit today for hospital follow up. All labs , imaging studies and progress notes from admission were reviewed with patient today

## 2021-01-05 ENCOUNTER — Ambulatory Visit (INDEPENDENT_AMBULATORY_CARE_PROVIDER_SITE_OTHER): Payer: Medicare Other | Admitting: *Deleted

## 2021-01-05 ENCOUNTER — Telehealth: Payer: Self-pay

## 2021-01-05 DIAGNOSIS — I10 Essential (primary) hypertension: Secondary | ICD-10-CM

## 2021-01-05 DIAGNOSIS — R531 Weakness: Secondary | ICD-10-CM

## 2021-01-05 DIAGNOSIS — R600 Localized edema: Secondary | ICD-10-CM

## 2021-01-05 DIAGNOSIS — R5383 Other fatigue: Secondary | ICD-10-CM

## 2021-01-05 DIAGNOSIS — I4811 Longstanding persistent atrial fibrillation: Secondary | ICD-10-CM | POA: Diagnosis not present

## 2021-01-05 DIAGNOSIS — I059 Rheumatic mitral valve disease, unspecified: Secondary | ICD-10-CM

## 2021-01-05 DIAGNOSIS — I429 Cardiomyopathy, unspecified: Secondary | ICD-10-CM

## 2021-01-05 DIAGNOSIS — I428 Other cardiomyopathies: Secondary | ICD-10-CM

## 2021-01-05 NOTE — Chronic Care Management (AMB) (Signed)
  Care Management   Follow Up Note   01/05/2021 Name: Linda [name] MRN: [id number] DOB: 1934/03/15   Referred by: Crecencio Mc, MD Reason for referral : Care Coordination (Verify Benefits) and Chronic Care Management   Successful telephone outreach to patient for follow up.   Patient answered and stated she was not sure if her insurance was going to cover CCM benefits.based on letter she has received.  She would like to think participation over and discuss with her daughter.  Request call back next month.  Follow Up Plan: The care management team will reach out to the patient again over the next 45 business days per patient request.   [name] RN, MSN RN Care Management Coordinator [place] [phone] [email or web address]

## 2021-01-05 NOTE — Telephone Encounter (Signed)
Placed in results folder.  

## 2021-01-05 NOTE — Telephone Encounter (Signed)
Pt came and dropped off BP and weight readings. Placed in folder up front

## 2021-01-07 NOTE — Telephone Encounter (Signed)
Spoke with pt and she stated that she is taking 20 mg of Furosemide and 25 mg of Spironolactone every morning. She is not taking 40 mg of Furosemide.

## 2021-01-07 NOTE — Telephone Encounter (Signed)
Patient has been informed.

## 2021-01-12 ENCOUNTER — Telehealth: Payer: Self-pay | Admitting: Internal Medicine

## 2021-01-12 NOTE — Telephone Encounter (Signed)
Crystal at [employer], [phone],  fax [phone]. Received orders that were not signed.

## 2021-01-12 NOTE — Telephone Encounter (Signed)
Placed in quick sign folder.  

## 2021-01-19 ENCOUNTER — Telehealth: Payer: Self-pay | Admitting: Internal Medicine

## 2021-01-19 DIAGNOSIS — I4811 Longstanding persistent atrial fibrillation: Secondary | ICD-10-CM

## 2021-01-19 NOTE — Telephone Encounter (Signed)
Please call patient and advised her to suspend her metoprolol due to very slow heart rate.

## 2021-01-19 NOTE — Telephone Encounter (Signed)
Patient informed us that she will have her caregiver check her BP. PCP was ok with that. Did not schedule BP check.

## 2021-01-19 NOTE — Telephone Encounter (Signed)
Patient has been notified, but she is worried about her BP, she will not be taking anything for BP at the moment.

## 2021-01-19 NOTE — Telephone Encounter (Signed)
[facility] physical therapist, [phone]. Hoyle Sauer reports that patient has a low heart rate, 35 at rest and 48 when moving. Hoyle Sauer doesn't feel she needs to go to ED or Urgent Care. She feels there may need to be a medication change.

## 2021-01-19 NOTE — Assessment & Plan Note (Signed)
Resting HR 35 on metoprolol 25 mg bid per Physical therapy,  Increases to 48 with therapy.  Advised to stop medication

## 2021-01-20 ENCOUNTER — Telehealth: Payer: Self-pay | Admitting: Internal Medicine

## 2021-01-20 NOTE — Telephone Encounter (Signed)
Patient is having trouble logging into her MyChart to leave Dr.Tullo a message about her blood pressure.Her blood pressure at 9:20 am today was 142/76 and her heart rate was 46.She also would like for Dr.Tullo to know that she did not take her blood pressure medicine last night.

## 2021-01-20 NOTE — Telephone Encounter (Signed)
Patient has been notified

## 2021-02-03 ENCOUNTER — Telehealth: Payer: Self-pay | Admitting: Internal Medicine

## 2021-02-03 NOTE — Telephone Encounter (Signed)
FYI  When a patient is started on a new medication please find out WHO started it so they can call the appropriate provider,.   I was not the one who prescribed the metoprolol.  Her cardiologist did (august 11 last OV with cardiologist)  because of an abnormal heart rhythm.  She needs to call cardiologist because the blood pressure and rate do not explain her symptoms.  She may need to be seen by cardiologist or go to ER

## 2021-02-03 NOTE — Telephone Encounter (Signed)
Patient refused to go to UC/ED per access nurse.

## 2021-02-03 NOTE — Telephone Encounter (Signed)
Patient informed, Due to the high volume of calls and your symptoms we have to forward your call to our Triage Nurse to expedient your call. Please hold for the transfer.  Patient transferred to Access Nurse. Due to taking metroprolol and almost passing out this morning and feeling groggy as well.No openings in office or virtual.She is currently with the home health Linton Rump from [facility] who will take her vitals and help the patient.

## 2021-02-03 NOTE — Telephone Encounter (Signed)
Patient has been notified

## 2021-02-04 ENCOUNTER — Ambulatory Visit: Payer: Medicare Other | Admitting: *Deleted

## 2021-02-04 DIAGNOSIS — I428 Other cardiomyopathies: Secondary | ICD-10-CM

## 2021-02-04 DIAGNOSIS — R531 Weakness: Secondary | ICD-10-CM

## 2021-02-04 DIAGNOSIS — I4811 Longstanding persistent atrial fibrillation: Secondary | ICD-10-CM

## 2021-02-04 DIAGNOSIS — I429 Cardiomyopathy, unspecified: Secondary | ICD-10-CM

## 2021-02-04 NOTE — Chronic Care Management (AMB) (Signed)
  Care Management   Follow Up Note   02/04/2021 Name: Linda [name] MRN: [id number] DOB: 1933/06/25   Referred by: Crecencio Mc, MD Reason for referral : Care Coordination (Verify benefits)   Successful telephone outreach to patient for CCM follow up.  Patient answered and stated she thinks she has received bill but unable to locate at this time  [facility] still awaiting information about benefits from upper leadership.  Patient requested call back another day and time once benefits verified.  Follow Up Plan: The care management team will reach out to the patient again over the next 30 days per patient request.   [name] RN, MSN RN Care Management Coordinator [place] [phone] [email or web address]

## 2021-03-01 DIAGNOSIS — Z95818 Presence of other cardiac implants and grafts: Secondary | ICD-10-CM | POA: Diagnosis not present

## 2021-03-01 DIAGNOSIS — I4821 Permanent atrial fibrillation: Secondary | ICD-10-CM | POA: Diagnosis not present

## 2021-03-01 DIAGNOSIS — K219 Gastro-esophageal reflux disease without esophagitis: Secondary | ICD-10-CM

## 2021-03-01 DIAGNOSIS — Z48812 Encounter for surgical aftercare following surgery on the circulatory system: Secondary | ICD-10-CM | POA: Diagnosis not present

## 2021-03-01 DIAGNOSIS — I509 Heart failure, unspecified: Secondary | ICD-10-CM

## 2021-03-01 DIAGNOSIS — M810 Age-related osteoporosis without current pathological fracture: Secondary | ICD-10-CM

## 2021-03-01 DIAGNOSIS — Z7901 Long term (current) use of anticoagulants: Secondary | ICD-10-CM

## 2021-03-01 DIAGNOSIS — F419 Anxiety disorder, unspecified: Secondary | ICD-10-CM

## 2021-03-01 DIAGNOSIS — I110 Hypertensive heart disease with heart failure: Secondary | ICD-10-CM | POA: Diagnosis not present

## 2021-03-01 DIAGNOSIS — E785 Hyperlipidemia, unspecified: Secondary | ICD-10-CM

## 2021-03-04 ENCOUNTER — Ambulatory Visit (INDEPENDENT_AMBULATORY_CARE_PROVIDER_SITE_OTHER): Payer: Medicare Other | Admitting: *Deleted

## 2021-03-04 DIAGNOSIS — I428 Other cardiomyopathies: Secondary | ICD-10-CM

## 2021-03-04 DIAGNOSIS — I4811 Longstanding persistent atrial fibrillation: Secondary | ICD-10-CM

## 2021-03-04 DIAGNOSIS — I10 Essential (primary) hypertension: Secondary | ICD-10-CM

## 2021-03-04 DIAGNOSIS — I429 Cardiomyopathy, unspecified: Secondary | ICD-10-CM

## 2021-03-04 NOTE — Chronic Care Management (AMB) (Signed)
Chronic Care Management   CCM RN Visit Note  03/04/2021 Name: Linda [name] MRN: [id number] DOB: Dec 13, 1933  Subjective: Linda [name] is a 87 y.o. year old female who is a primary care patient of Crecencio Mc, MD. The care management team was consulted for assistance with disease management and care coordination needs.    Engaged with patient face to face for follow up visit in response to provider referral for case management and/or care coordination services.   Consent to Services:  The patient was given information about Chronic Care Management services, agreed to services, and gave verbal consent prior to initiation of services.  Please see initial visit note for detailed documentation.   Patient agreed to services and verbal consent obtained.   Assessment: Review of patient past medical history, allergies, medications, health status, including review of consultants reports, laboratory and other test data, was performed as part of comprehensive evaluation and provision of chronic care management services.   SDOH (Social Determinants of Health) assessments and interventions performed:    CCM Care Plan  Allergies  Allergen Reactions   Barium Iodide     Unknown   Crestor [Rosuvastatin Calcium] Other (See Comments)    Myalgia    Penicillins Rash   Sulfa Antibiotics Rash   Sulfonamide Derivatives Rash    Outpatient Encounter Medications as of 03/04/2021  Medication Sig Note   acetaminophen (TYLENOL) 500 MG tablet Take 500 mg by mouth every 6 (six) hours as needed for moderate pain or headache.    cetirizine (ZYRTEC) 10 MG tablet Take 10 mg by mouth daily as needed for allergies.    Cholecalciferol (VITAMIN D) 2000 units tablet Take 2,000 Units by mouth daily.    clindamycin (CLEOCIN) 300 MG capsule Take 300 mg by mouth 2 (two) times daily. 12/24/2020: As needed for Dental procedures post surgery   ELIQUIS 5 MG TABS tablet Take 1 tablet (5 mg total) by mouth 2 (two) times  daily.    estradiol (ESTRACE) 0.1 MG/GM vaginal cream Place 1 g vaginally 3 (three) times a week. Pea sized amount per urethra    furosemide (LASIX) 20 MG tablet Take 40 mg by mouth daily. 11/03/2020: Reports taking 1 tablet daily   Ketotifen Fumarate (ALLERGY EYE DROPS OP) Place 1 drop into both eyes daily as needed (allergies).    LORazepam (ATIVAN) 0.5 MG tablet Take 1 tablet (0.5 mg total) by mouth at bedtime as needed for anxiety.    losartan (COZAAR) 25 MG tablet Take 1 tablet (25 mg total) by mouth daily. (Patient taking differently: Take 12.5 mg by mouth at bedtime.) 08/25/2020: Reports taking 1/2 tablet nightly   metoprolol tartrate (LOPRESSOR) 25 MG tablet Take by mouth. 12/24/2020: Reports medication makes her not feel well (weak and faint), so she takes medication occasionally 1/2 tablet   potassium chloride (KLOR-CON) 10 MEQ tablet Take 10-20 mEq by mouth daily. 12/24/2020: Medication discontinued after surgery   spironolactone (ALDACTONE) 25 MG tablet Take 1 tablet by mouth daily.    traZODone (DESYREL) 50 MG tablet Take 50 mg by mouth at bedtime.    No facility-administered encounter medications on file as of 03/04/2021.    Patient Active Problem List   Diagnosis Date Noted   Proximal muscle weakness 08/24/2020   Mitral valve disease 07/09/2020   Fatigue 05/29/2020   Diuretic-induced hypokalemia 12/16/2019   Myalgia due to statin 12/15/2019   Acquired thrombophilia ([place]) 12/15/2019   Skin lesion of hand 08/27/2018   Hospital discharge follow-up  05/25/2018   History of CVA (cerebrovascular accident) without residual deficits 05/10/2018   Parent-child estrangement nec 02/09/2018   Basal cell carcinoma (BCC) of right lower extremity 08/08/2017   Hematuria, gross 08/08/2017   Extremity atherosclerosis with intermittent claudication ([place]) 08/08/2017   Generalized anxiety disorder 01/31/2017   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   Controlled type 2 diabetes mellitus with  microalbuminuria, without long-term current use of insulin ([place]) 01/20/2016   Insomnia secondary to anxiety 10/09/2015   Cardiomyopathy, idiopathic ([place]) 09/03/2015   Polycythemia, secondary 07/04/2015   Pre-syncope 07/02/2015   Osteopenia 08/21/2014   Gastritis 05/26/2014   GERD (gastroesophageal reflux disease) 03/04/2014   Medicare annual wellness visit, subsequent 01/13/2014   Essential hypertension 11/04/2013   Screening for breast cancer 07/01/2013   Left shoulder pain 03/21/2013   Hoarseness of voice 03/21/2013   Edema 09/05/2010   VENTRICULAR HYPERTROPHY, LEFT 08/16/2010   Hyperlipidemia 06/03/2010   ATRIAL FIBRILLATION 08/13/2009    Conditions to be addressed/monitored:CHF and Weakness  Care Plan : Heart Failure  Updates made by Leona Singleton, RN since 03/04/2021 12:00 AM     Problem: Heart Failure (increasing edema and shortness of breath   Priority: High     Long-Range Goal: Symptom Exacerbation Prevented or Minimized   Start Date: 08/25/2020  Expected End Date: 06/17/2021  This Visit's Progress: On track  Recent Progress: On track  Priority: High  Note:   Current Barriers:  Knowledge deficits related to basic heart failure pathophysiology and self care management as evidenced by increasing shortness of breath with exertion and increase in lower extremity edema.  Patient underwent mitral valve clipping end of June.  Discharged from [facility] on 12/15/20.  Continues to repot report she feels much better since since surgery.  Denies any shortness of breath or  lower extremity edema.  Does report her Metoprolol dose has been discontinued but states she has periods of hypertension and has been taking 1/2 tablet every few days.  Encouraged to discuss with provider. Unable to perform ADLs independently Unable to perform IADLs independently Financial strain-patient reporting her Eliquis copay has increased to $100 monthly.   Nurse Case Manager Clinical Goal(s):  Patient will  report no hospitalization related to heart failure exacerbation within the next 90 days. patient will weigh self daily and record patient will verbalize understanding of Heart Failure Action Plan and when to call doctor patient will take all Heart Failure mediations as prescribed Patient will continue to monitor heart rates and notify provider if elevated heart rates sustains Interventions:  Collaboration with Crecencio Mc, MD regarding development and update of comprehensive plan of care as evidenced by provider attestation and co-signature Inter-disciplinary care team collaboration (see longitudinal plan of care) Basic overview and discussion of pathophysiology of Heart Failure Reviewed Heart Failure Action Plan in depth and provided written copy; encouraged patient to review Encouraged continued daily weight monitoring, documenting in log for provider review Reviewed medications and encouraged medication compliance; discussed timing of taking second fluid pill  Reviewed role of diuretics in prevention of fluid overload and dosage of Lasix (patient verbalized her understanding and states taking 1 tablets daily) Discussed increased heart rates and atrial fibrillation and encouraged patient to continue to monitor feelings of rapid rates and to notify provider if rates sustains Encouraged to attend scheduled upcoming medical appointments Self-awareness of signs/symptoms of worsening disease encouraged Sent heart failure zones and action plan education; sent EMMI Video for Atrial Fibrillation and Heart Failure rest and exercise and encouraged patient  to review Encouraged patient to continue to use 2022 Calendar Booklet for documentation of vital signs Encouraged patient to contact Cardiologist to discuss medications (Metoprolol) dosing and how she is feeling Reviewed medication changes post surgery and encouraged medication compliance; encouraged patient to discuss medications with PCP and  Cardiology Patient Goals/Self-Care Activities:  Call office if I gain more than 2 pounds in one day or 5 pounds in one week Keep legs up/elevated while sitting and wear support hose Watch for swelling in feet, ankles and legs every day Weigh myself daily and keep in log to take to provider for review Follow rescue plan and know when to call the doctor Continue to monitor blood pressures daily taking log to provider review/monitoring for light headiness Contact Cardiologist concerning Metoprolol Follow Up Plan: The care management team will reach out to the patient again over the next 30 business days.       Care Plan : Increase in weakness limiting patient independence  Updates made by Leona Singleton, RN since 03/04/2021 12:00 AM     Problem: Functional Decline   Priority: Medium     Long-Range Goal: Functional Decline Minimized over the next 90 days   Start Date: 08/25/2020  Expected End Date: 02/15/2021  This Visit's Progress: On track  Recent Progress: On track  Priority: Medium  Note:   Current Barriers:  Knowledge Deficits related to increasing strength in patient with declining functional ability as evidenced by patient reporting she has little energy and shortness of breath with minimal activity.  Patient states she feels a lot better after surgery. Discharged from hospital on 12/15/20.  Has completed home health therapy.  Ambulating with cane.  Denies any falls.  Is needing to find new home aid assistance; encouraged to use agency for staff Unable to perform ADLs independently Unable to perform IADLs independently Care Coordination needs related to home health physical therapy  in a patient with decreasing strength Clinical Goal(s):  patient will demonstrate improved adherence to prescribed treatment plan for decreasing falls as evidenced by patient reporting and review of EMR patient will verbalize using fall risk reduction strategies discussed patient will not experience  additional falls patient will verbalize understanding of plan for safety and increasing strength Interventions:  Collaboration with Crecencio Mc, MD regarding development and update of comprehensive plan of care as evidenced by provider attestation and co-signature Inter-disciplinary care team collaboration (see longitudinal plan of care) Discussed fall precaution and Fall prevention strategies Reviewed medications and discussed potential side effects of medications such as dizziness and frequent urination Assessed patients knowledge of fall risk prevention and discussed increasing activity slowly as tolerated Acknowledged her feelings of apprehension and reassured her physical therapist would be there to help her and give tips Discussed possible medical reasons for decrease in strength (heart failure, mitral valve problems) Encouraged patient to continue to monitor blood pressures daily monitoring for hypotension and prevention of falls Maximum independence promoted as safely as can providing self care and seeking assistance from home aide as necessary; instructed do not push herself to do things that feel unsafe or that she is not used to doing alone Encouraged to continue to work with home health therapy Instructed and encouraged to use cane with all ambulation Energy conservation techniques promoted Emotional support and empathy provided to patient Discussed with patient to prepare for rehabilitation services to develop an individualized activity and exercise program as indicated, such as progressive resistance strengthening, balance and activity tolerance training or home exercise program  Promoted gradual increase in intensity of activity and exercise as tolerated, such as duration, frequency, exercise repetition and sets and intensity Patient Goals/Self-Care Deficits:   Continue to look for home aide assistance Continue to do exercises taught by therapist Use cane with walking, fall  precautions, change positions slowly Start slowly and increase the amount of time for working out as tolerated  Do not do any activity that does not feel safe or that you normally would not do on your own (wait for some assistance from family or home aid) Follow Up Plan: The care management team will reach out to the patient again over the next 45 business days.        Plan:The care management team will reach out to the patient again over the next 45 business days.  [name] RN, MSN RN Care Management Coordinator [place] [phone] [email or web address]

## 2021-03-04 NOTE — Patient Instructions (Signed)
Visit Information   Goals Addressed             This Visit's Progress    (RNCM) Increase My Muscle Strength   On track    Timeframe:  Long-Range Goal Priority:  Medium Start Date:  08/25/2020                           Expected End Date: 05/18/21                      Follow Up Date 04/08/21    Continue to look for home aide assistance Continue to do exercises tauht by therapist Use cane with walking, fall precautions, change positions slowly Start slowly and increase the amount of time for working out as tolerated  Do not do any activity that does not feel safe or that you normally would not do on your own (wait for some assistance from family or home aid)   Why is this important?   Walking and easy exercises make you stronger. These also give you energy.  Every little bit helps, at any age.    Notes:      (RNCM) Track and Manage Fluids and Swelling-Heart Failure   On track    Timeframe:  Long-Range Goal Priority:  High Start Date:   08/25/20                          Expected End Date:   06/17/21                    Follow Up Date 04/08/21    Call office if I gain more than 2 pounds in one day or 5 pounds in one week Keep legs up/elevated while sitting and wear support hose Watch for swelling in feet, ankles and legs every day Weigh myself daily and keep in log to take to provider for review Follow rescue plan and know when to call the doctor Continue to monitor blood pressures daily taking log to provider review/monitoring for light headiness Contact Cardiologist concerning Metoprolol   Why is this important?   It is important to check your weight daily and watch how much salt and liquids you have.  It will help you to manage your heart failure.    Notes:         Patient verbalizes understanding of instructions provided today and agrees to view in [place].   The care management team will reach out to the patient again over the next 45 business days.   [name] RN, MSN RN Care Management Coordinator [place] [phone] [email or web address]

## 2021-03-18 DIAGNOSIS — I4811 Longstanding persistent atrial fibrillation: Secondary | ICD-10-CM

## 2021-03-18 DIAGNOSIS — I10 Essential (primary) hypertension: Secondary | ICD-10-CM

## 2021-03-22 ENCOUNTER — Encounter: Payer: Self-pay | Admitting: Family

## 2021-03-22 ENCOUNTER — Telehealth: Payer: Self-pay | Admitting: Internal Medicine

## 2021-03-22 ENCOUNTER — Other Ambulatory Visit: Payer: Self-pay

## 2021-03-22 ENCOUNTER — Ambulatory Visit (INDEPENDENT_AMBULATORY_CARE_PROVIDER_SITE_OTHER): Payer: Medicare Other | Admitting: Family

## 2021-03-22 VITALS — BP 124/78 | HR 40 | Temp 95.2°F | Ht 63.5 in | Wt 168.4 lb

## 2021-03-22 DIAGNOSIS — R109 Unspecified abdominal pain: Secondary | ICD-10-CM

## 2021-03-22 DIAGNOSIS — M546 Pain in thoracic spine: Secondary | ICD-10-CM

## 2021-03-22 DIAGNOSIS — Z23 Encounter for immunization: Secondary | ICD-10-CM

## 2021-03-22 LAB — POCT URINALYSIS DIPSTICK
Bilirubin, UA: NEGATIVE
Glucose, UA: NEGATIVE
Ketones, UA: NEGATIVE
Nitrite, UA: NEGATIVE
Protein, UA: POSITIVE — AB
Spec Grav, UA: 1.025 (ref 1.010–1.025)
Urobilinogen, UA: 0.2 E.U./dL
pH, UA: 6 (ref 5.0–8.0)

## 2021-03-22 NOTE — Progress Notes (Signed)
Acute Office Visit  Subjective:    Patient ID: SHAWNAY [name], female    DOB: 09/10/1933, 87 y.o.   MRN: [id number]  Chief Complaint  Patient presents with   Pain    Pt states shes having pain in her side that is moving towards her back.    HPI Patient is in today with c/o left side abdominal and back pain that has been off and on x 2 weeks. She reports it has been as intense as 8/10 but more like a 4/10, intermittent. Believes the pain may be worse with movement but not sure. No nausea or vomiting. Reports normal bowel movements. Has a history of UTIs in the past.   Also expressed concern today that her home health aid is homeless and sleeping out of her car. She has notified her son who is working on the issue with the home health agency. Son lives in [place]. Daughter lives in [place].   Past Medical History:  Diagnosis Date   Anxiety    Arrhythmia    paroxysmal atrial fibrillation   CHF (congestive heart failure) (HCC)    Colon polyp    Diverticulitis    Dysrhythmia    GERD (gastroesophageal reflux disease)    Hard of hearing    Hoarseness    Hyperlipidemia    Hypertension    Kidney stone    Motion sickness    Parathyroid disease (HCC)    Parathyroidectomy    PONV (postoperative nausea and vomiting)    Pre-diabetes    PVC (premature ventricular contraction)    Skin cancer    Stroke ([place])    TIA (transient ischemic attack) 04/2018   no deficitis   UTI (lower urinary tract infection)    Varicose veins of both lower extremities    Wears hearing aid in both ears     Past Surgical History:  Procedure Laterality Date   ABDOMINAL HYSTERECTOMY     APPENDECTOMY     CATARACT EXTRACTION W/PHACO Left 03/05/2019   Procedure: CATARACT EXTRACTION PHACO AND INTRAOCULAR LENS PLACEMENT ([place])   0:57 15.6% 9.05;  Surgeon: Leandrew Koyanagi, MD;  Location: [facility];  Service: Ophthalmology;  Laterality: Left;  Diabetic - diet cotrolled   CATARACT EXTRACTION W/PHACO  Right 04/09/2019   Procedure: CATARACT EXTRACTION PHACO AND INTRAOCULAR LENS PLACEMENT ([facility]) RIGHT DIABETIC 00:47.6  15.9%  7.60;  Surgeon: Leandrew Koyanagi, MD;  Location: [facility];  Service: Ophthalmology;  Laterality: Right;   CHOLECYSTECTOMY  2002   ESOPHAGOGASTRODUODENOSCOPY (EGD) WITH PROPOFOL N/A 12/17/2019   Procedure: ESOPHAGOGASTRODUODENOSCOPY (EGD) WITH PROPOFOL;  Surgeon: Robert Bellow, MD;  Location: [facility];  Service: Endoscopy;  Laterality: N/A;   LITHOTRIPSY     MITRAL VALVE REPAIR  12/14/2020   PARATHYROIDECTOMY  2004   1 removed   ROTATOR CUFF REPAIR  2002   TEE WITHOUT CARDIOVERSION N/A 09/29/2020   Procedure: TRANSESOPHAGEAL ECHOCARDIOGRAM (TEE);  Surgeon: Teodoro Spray, MD;  Location: [facility];  Service: Cardiovascular;  Laterality: N/A;   TONSILLECTOMY  1956   TONSILLECTOMY     TOTAL ABDOMINAL HYSTERECTOMY W/ BILATERAL SALPINGOOPHORECTOMY  1984   VEIN LIGATION AND STRIPPING      Family History  Problem Relation Age of Onset   Heart disease Brother    Stroke Mother    Skin cancer Father    Breast cancer Paternal Grandmother     Social History   Socioeconomic History   Marital status: Widowed    Spouse name: [name]  Number of children: 2   Years of education: 12   Highest education level: Not on file  Occupational History   Occupation: Product/process development scientist: RETIRED    Comment: Retired  Tobacco Use   Smoking status: Never   Smokeless tobacco: Never  Vaping Use   Vaping Use: Never used  Substance and Sexual Activity   Alcohol use: No   Drug use: No   Sexual activity: Not Currently    Birth control/protection: None  Other Topics Concern   Not on file  Social History Narrative   Trishelle is from [facility] [facility] [place] [facility] and [place] farm. Recently widowed. She was married to [place], her husband for 59 years. They have a daughter and a son. She enjoys gardening.   Social Determinants of Health   Financial Resource Strain: Low  Risk    Difficulty of Paying Living Expenses: Not very hard  Food Insecurity: No Food Insecurity   Worried About Charity fundraiser in the Last Year: Never true   Ran Out of Food in the Last Year: Never true  Transportation Needs: No Transportation Needs   Lack of Transportation (Medical): No   Lack of Transportation (Non-Medical): No  Physical Activity: Inactive   Days of Exercise per Week: 0 days   Minutes of Exercise per Session: 0 min  Stress: No Stress Concern Present   Feeling of Stress : Not at all  Social Connections: Moderately Integrated   Frequency of Communication with Friends and Family: Twice a week   Frequency of Social Gatherings with Friends and Family: Once a week   Attends Religious Services: 1 to 4 times per year   Active Member of [employer] or Organizations: Yes   Attends Archivist Meetings: 1 to 4 times per year   Marital Status: Widowed  Human resources officer Violence: Not At Risk   Fear of Current or Ex-Partner: No   Emotionally Abused: No   Physically Abused: No   Sexually Abused: No    Outpatient Medications Prior to Visit  Medication Sig Dispense Refill   acetaminophen (TYLENOL) 500 MG tablet Take 500 mg by mouth every 6 (six) hours as needed for moderate pain or headache.     cetirizine (ZYRTEC) 10 MG tablet Take 10 mg by mouth daily as needed for allergies.     Cholecalciferol (VITAMIN D) 2000 units tablet Take 2,000 Units by mouth daily.     clindamycin (CLEOCIN) 300 MG capsule Take 300 mg by mouth 2 (two) times daily.     ELIQUIS 5 MG TABS tablet Take 1 tablet (5 mg total) by mouth 2 (two) times daily. 60 tablet 0   estradiol (ESTRACE) 0.1 MG/GM vaginal cream Place 1 g vaginally 3 (three) times a week. Pea sized amount per urethra 42.5 g 12   furosemide (LASIX) 20 MG tablet Take 40 mg by mouth daily.     Ketotifen Fumarate (ALLERGY EYE DROPS OP) Place 1 drop into both eyes daily as needed (allergies).     LORazepam (ATIVAN) 0.5 MG tablet Take 1  tablet (0.5 mg total) by mouth at bedtime as needed for anxiety. 30 tablet 0   losartan (COZAAR) 25 MG tablet Take 1 tablet (25 mg total) by mouth daily. (Patient taking differently: Take 12.5 mg by mouth at bedtime.) 90 tablet 1   metoprolol tartrate (LOPRESSOR) 25 MG tablet Take by mouth.     potassium chloride (KLOR-CON) 10 MEQ tablet Take 10-20 mEq by mouth daily.  spironolactone (ALDACTONE) 25 MG tablet Take 1 tablet by mouth daily.     traZODone (DESYREL) 50 MG tablet Take 50 mg by mouth at bedtime.     No facility-administered medications prior to visit.    Allergies  Allergen Reactions   Barium Iodide     Unknown   Crestor [Rosuvastatin Calcium] Other (See Comments)    Myalgia    Penicillins Rash   Sulfa Antibiotics Rash   Sulfonamide Derivatives Rash    Review of Systems  Respiratory: Negative.    Cardiovascular: Negative.   Gastrointestinal:  Positive for abdominal pain. Negative for blood in stool.       Left sided intermittent pain  Genitourinary:  Negative for dysuria and flank pain.  Musculoskeletal:  Positive for back pain.       Left sided intermittent  Skin: Negative.   Allergic/Immunologic: Negative.   Psychiatric/Behavioral: Negative.    All other systems reviewed and are negative.     Objective:    Physical Exam  BP 124/78   Pulse (!) 40   Temp (!) 95.2 F (35.1 C)   Ht 5' 3.5" (1.613 m)   Wt 168 lb 6.4 oz (76.4 kg)   SpO2 93%   BMI 29.36 kg/m  Wt Readings from Last 3 Encounters:  03/22/21 168 lb 6.4 oz (76.4 kg)  12/29/20 164 lb 12.8 oz (74.8 kg)  09/29/20 176 lb (79.8 kg)    Health Maintenance Due  Topic Date Due   Zoster Vaccines- Shingrix (1 of 2) Never done   COVID-19 Vaccine (4 - Booster for Pfizer series) 06/23/2020   OPHTHALMOLOGY EXAM  11/03/2020   INFLUENZA VACCINE  01/17/2021   HEMOGLOBIN A1C  03/12/2021    There are no preventive care reminders to display for this patient.   Lab Results  Component Value Date   TSH  1.91 05/27/2020   Lab Results  Component Value Date   WBC 5.8 05/27/2020   HGB 15.7 (H) 05/27/2020   HCT 47.1 (H) 05/27/2020   MCV 96.1 05/27/2020   PLT 196.0 05/27/2020   Lab Results  Component Value Date   NA 142 09/09/2020   K 4.1 12/21/2020   CO2 35 (H) 09/09/2020   GLUCOSE 160 (H) 09/09/2020   BUN 22 09/09/2020   CREATININE 0.85 09/09/2020   BILITOT 2.6 (H) 09/09/2020   ALKPHOS 74 09/09/2020   AST 26 09/09/2020   ALT 22 09/09/2020   PROT 6.3 09/09/2020   ALBUMIN 4.0 09/09/2020   CALCIUM 9.5 09/09/2020   ANIONGAP 12 05/10/2018   GFR 62.73 09/09/2020   Lab Results  Component Value Date   CHOL 145 09/09/2020   Lab Results  Component Value Date   HDL 53.70 09/09/2020   Lab Results  Component Value Date   LDLCALC 79 09/09/2020   Lab Results  Component Value Date   TRIG 63.0 09/09/2020   Lab Results  Component Value Date   CHOLHDL 3 09/09/2020   Lab Results  Component Value Date   HGBA1C 7.2 (H) 09/09/2020       Assessment & Plan:   Problem List Items Addressed This Visit   None Visit Diagnoses     Left sided abdominal pain    -  Primary   Relevant Orders   POC Urinalysis Dipstick   DG Abd 1 View   Urine Culture   Acute left-sided thoracic back pain       Relevant Orders   POC Urinalysis Dipstick   DG Abd 1  View   Urine Culture        No orders of the defined types were placed in this encounter.  Call the office if symptoms worsen or persist. I suspect this is musculoskeletal but will r/o renal stone since patient is concerned. Will follow-up pending KUB and sooner as needed.   Kennyth Arnold, FNP

## 2021-03-22 NOTE — Telephone Encounter (Signed)
Patient calling in with left flank pain around to the back. States this was hurting a lot this past weekend. States once in a while she has burning urination.   Patient states that she was informed by her provider that if she ever suspects she has a kidney or urinary infection she can drop off a specimen.   Patient wanting labs ordered and a specimen cup made ready for her. Please advise

## 2021-03-22 NOTE — Telephone Encounter (Signed)
Spoke with pt and scheduled her for an appt with NP this afternoon.

## 2021-03-24 ENCOUNTER — Other Ambulatory Visit: Payer: Self-pay

## 2021-03-24 ENCOUNTER — Other Ambulatory Visit: Payer: Medicare Other

## 2021-03-24 ENCOUNTER — Ambulatory Visit (INDEPENDENT_AMBULATORY_CARE_PROVIDER_SITE_OTHER): Payer: Medicare Other

## 2021-03-24 DIAGNOSIS — R109 Unspecified abdominal pain: Secondary | ICD-10-CM | POA: Diagnosis not present

## 2021-03-24 DIAGNOSIS — M546 Pain in thoracic spine: Secondary | ICD-10-CM

## 2021-03-26 LAB — URINE CULTURE
MICRO NUMBER:: 12470121
SPECIMEN QUALITY:: ADEQUATE

## 2021-03-27 ENCOUNTER — Other Ambulatory Visit: Payer: Self-pay | Admitting: Family

## 2021-03-27 MED ORDER — NITROFURANTOIN MONOHYD MACRO 100 MG PO CAPS: 100.0000 mg | ORAL_CAPSULE | Freq: Two times a day (BID) | ORAL | 0 refills | Status: DC

## 2021-04-08 ENCOUNTER — Ambulatory Visit (INDEPENDENT_AMBULATORY_CARE_PROVIDER_SITE_OTHER): Payer: Medicare Other | Admitting: *Deleted

## 2021-04-08 DIAGNOSIS — I517 Cardiomegaly: Secondary | ICD-10-CM

## 2021-04-08 DIAGNOSIS — I10 Essential (primary) hypertension: Secondary | ICD-10-CM

## 2021-04-08 DIAGNOSIS — I429 Cardiomyopathy, unspecified: Secondary | ICD-10-CM

## 2021-04-08 NOTE — Patient Instructions (Signed)
Visit Information  PATIENT GOALS:  Goals Addressed             This Visit's Progress    COMPLETED: (RNCM) Increase My Muscle Strength   On track    Timeframe:  Long-Range Goal Priority:  Medium Start Date:  08/25/2020                           Expected End Date: 05/18/21                      Follow Up Date 04/08/21    Continue to look for home aide assistance Continue to do exercises tauht by therapist Use cane with walking, fall precautions, change positions slowly Start slowly and increase the amount of time for working out as tolerated  Do not do any activity that does not feel safe or that you normally would not do on your own (wait for some assistance from family or home aid)   Why is this important?   Walking and easy exercises make you stronger. These also give you energy.  Every little bit helps, at any age.    Notes: 04/08/21-feels strength is back to baseline; ambulating with cane at times.  No longer using home aide assistance but looking to move to Senior Living     [facility] Track and Manage Fluids and Swelling-Heart Failure   On track    Timeframe:  Long-Range Goal Priority:  High Start Date:   08/25/20                          Expected End Date:   06/17/21                    Follow Up Date 05/20/21    Call office if I gain more than 2 pounds in one day or 5 pounds in one week Keep legs up/elevated while sitting and wear support hose Watch for swelling in feet, ankles and legs every day Weigh myself daily and keep in log to take to provider for review Follow rescue plan and know when to call the doctor Continue to monitor blood pressures daily taking log to provider review/monitoring for light headiness    Why is this important?   It is important to check your weight daily and watch how much salt and liquids you have.  It will help you to manage your heart failure.    Notes:         Patient verbalizes understanding of instructions provided today and  agrees to view in [place].   The care management team will reach out to the patient again over the next 45 business days.    [name] RN, MSN RN Care Management Coordinator [place] [phone] [email or web address]

## 2021-04-08 NOTE — Chronic Care Management (AMB) (Signed)
Care Management    RN Visit Note  04/08/2021 Name: Linda [name] MRN: [id number] DOB: 07-28-1933  Subjective: Linda [name] is a 87 y.o. year old female who is a primary care patient of Crecencio Mc, MD. The care management team was consulted for assistance with disease management and care coordination needs.     for follow up visit in response to provider referEngaged with patient by telephoneral for case management and/or care coordination services.   Consent to Services:   [name] was given information about Care Management services today including:  Care Management services includes personalized support from designated clinical staff supervised by her physician, including individualized plan of care and coordination with other care providers 24/7 contact phone numbers for assistance for urgent and routine care needs. The patient may stop case management services at any time by phone call to the office staff.  Patient agreed to services and consent obtained.   Assessment: Review of patient past medical history, allergies, medications, health status, including review of consultants reports, laboratory and other test data, was performed as part of comprehensive evaluation and provision of chronic care management services.   SDOH (Social Determinants of Health) assessments and interventions performed:    Care Plan  Allergies  Allergen Reactions   Barium Iodide     Unknown   Crestor [Rosuvastatin Calcium] Other (See Comments)    Myalgia    Penicillins Rash   Sulfa Antibiotics Rash   Sulfonamide Derivatives Rash    Outpatient Encounter Medications as of 04/08/2021  Medication Sig Note   ELIQUIS 5 MG TABS tablet Take 1 tablet (5 mg total) by mouth 2 (two) times daily.    losartan (COZAAR) 25 MG tablet Take 1 tablet (25 mg total) by mouth daily. (Patient taking differently: Take 12.5 mg by mouth at bedtime.) 08/25/2020: Reports taking 1/2 tablet nightly   traZODone (DESYREL)  50 MG tablet Take 50 mg by mouth at bedtime.    acetaminophen (TYLENOL) 500 MG tablet Take 500 mg by mouth every 6 (six) hours as needed for moderate pain or headache.    cetirizine (ZYRTEC) 10 MG tablet Take 10 mg by mouth daily as needed for allergies.    Cholecalciferol (VITAMIN D) 2000 units tablet Take 2,000 Units by mouth daily.    clindamycin (CLEOCIN) 300 MG capsule Take 300 mg by mouth 2 (two) times daily. 12/24/2020: As needed for Dental procedures post surgery   estradiol (ESTRACE) 0.1 MG/GM vaginal cream Place 1 g vaginally 3 (three) times a week. Pea sized amount per urethra    furosemide (LASIX) 20 MG tablet Take 40 mg by mouth daily. (Patient not taking: Reported on 04/08/2021) 04/08/2021: Reports taking as needed   Ketotifen Fumarate (ALLERGY EYE DROPS OP) Place 1 drop into both eyes daily as needed (allergies).    LORazepam (ATIVAN) 0.5 MG tablet Take 1 tablet (0.5 mg total) by mouth at bedtime as needed for anxiety.    metoprolol tartrate (LOPRESSOR) 25 MG tablet Take by mouth. (Patient not taking: Reported on 04/08/2021) 12/24/2020: Reports medication makes her not feel well (weak and faint), so she takes medication occasionally 1/2 tablet   nitrofurantoin, macrocrystal-monohydrate, (MACROBID) 100 MG capsule Take 1 capsule (100 mg total) by mouth 2 (two) times daily. (Patient not taking: Reported on 04/08/2021) 04/08/2021: completed   potassium chloride (KLOR-CON) 10 MEQ tablet Take 10-20 mEq by mouth daily. (Patient not taking: Reported on 04/08/2021) 12/24/2020: Medication discontinued after surgery   spironolactone (ALDACTONE) 25 MG  tablet Take 1 tablet by mouth daily. (Patient not taking: Reported on 04/08/2021)    No facility-administered encounter medications on file as of 04/08/2021.    Patient Active Problem List   Diagnosis Date Noted   Proximal muscle weakness 08/24/2020   Mitral valve disease 07/09/2020   Fatigue 05/29/2020   Diuretic-induced hypokalemia 12/16/2019    Myalgia due to statin 12/15/2019   Acquired thrombophilia ([place]) 12/15/2019   Skin lesion of hand 08/27/2018   Hospital discharge follow-up 05/25/2018   History of CVA (cerebrovascular accident) without residual deficits 05/10/2018   Parent-child estrangement nec 02/09/2018   Basal cell carcinoma (BCC) of right lower extremity 08/08/2017   Hematuria, gross 08/08/2017   Extremity atherosclerosis with intermittent claudication ([place]) 08/08/2017   Generalized anxiety disorder 01/31/2017   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   Controlled type 2 diabetes mellitus with microalbuminuria, without long-term current use of insulin ([place]) 01/20/2016   Insomnia secondary to anxiety 10/09/2015   Cardiomyopathy, idiopathic ([place]) 09/03/2015   Polycythemia, secondary 07/04/2015   Pre-syncope 07/02/2015   Osteopenia 08/21/2014   Gastritis 05/26/2014   GERD (gastroesophageal reflux disease) 03/04/2014   Medicare annual wellness visit, subsequent 01/13/2014   Essential hypertension 11/04/2013   Screening for breast cancer 07/01/2013   Left shoulder pain 03/21/2013   Hoarseness of voice 03/21/2013   Edema 09/05/2010   VENTRICULAR HYPERTROPHY, LEFT 08/16/2010   Hyperlipidemia 06/03/2010   ATRIAL FIBRILLATION 08/13/2009    Conditions to be addressed/monitored: CHF and HTN  Care Plan : Heart Failure  Updates made by Leona Singleton, RN since 04/08/2021 12:00 AM     Problem: Heart Failure (increasing edema and shortness of breath   Priority: Medium     Long-Range Goal: Symptom Exacerbation Prevented or Minimized   Start Date: 08/25/2020  Expected End Date: 06/17/2021  This Visit's Progress: On track  Recent Progress: On track  Priority: Medium  Note:   Current Barriers:  Knowledge deficits related to basic heart failure pathophysiology and self care management as evidenced by increasing shortness of breath with exertion and increase in lower extremity edema.  Patient underwent mitral valve  clipping end of June.  Discharged from [facility] on 12/15/20.  Continues to repot report she feels much better since since surgery.  Denies any shortness of breath or  lower extremity edema.  Weight this morning 164 pounds.  States blood pressures have been good lately.  No longer has home aid and is looking to move to Senior Living in the next few weeks Unable to perform ADLs independently Unable to perform IADLs independently Financial strain-patient reporting her Eliquis copay has increased to $100 monthly.   Nurse Case Manager Clinical Goal(s):  Patient will report no hospitalization related to heart failure exacerbation within the next 90 days. patient will weigh self daily and record patient will verbalize understanding of Heart Failure Action Plan and when to call doctor patient will take all Heart Failure mediations as prescribed Patient will continue to monitor heart rates and notify provider if elevated heart rates sustains Interventions:  Collaboration with Crecencio Mc, MD regarding development and update of comprehensive plan of care as evidenced by provider attestation and co-signature Inter-disciplinary care team collaboration (see longitudinal plan of care) Basic overview and discussion of pathophysiology of Heart Failure Reviewed Heart Failure Action Plan in depth and provided written copy; encouraged patient to review Encouraged continued daily weight monitoring, documenting in log for provider review Reviewed medications and encouraged medication compliance; discussed timing of taking second fluid pill  Reviewed role of diuretics in prevention of fluid overload and dosage of Lasix (patient verbalized her understanding and states taking 1 tablets daily) Discussed increased heart rates and atrial fibrillation and encouraged patient to continue to monitor feelings of rapid rates and to notify provider if rates sustains Encouraged to attend scheduled upcoming medical  appointments Self-awareness of signs/symptoms of worsening disease encouraged Sent heart failure zones and action plan education; sent EMMI Video for Atrial Fibrillation and Heart Failure rest and exercise and encouraged patient to review Encouraged patient to continue to use 2022 Calendar Booklet for documentation of vital signs Reviewed medication changes post surgery and encouraged medication compliance; encouraged patient to discuss medications with PCP and Cardiology Fall precautions and preventions reviewed and discussed Patient Goals/Self-Care Activities:  Call office if I gain more than 2 pounds in one day or 5 pounds in one week Keep legs up/elevated while sitting and wear support hose Watch for swelling in feet, ankles and legs every day Weigh myself daily and keep in log to take to provider for review Follow rescue plan and know when to call the doctor Continue to monitor blood pressures daily taking log to provider review/monitoring for light headiness Follow Up Plan: The care management team will reach out to the patient again over the next 45 business days.       Care Plan : Increase in weakness limiting patient independence  Updates made by Leona Singleton, RN since 04/08/2021 12:00 AM  Completed 04/08/2021   Problem: Functional Decline Resolved 04/08/2021  Priority: Medium  Note:   04/08/21-feels strength is back to baseline; ambulating with cane at times.  No longer using home aide assistance but looking to move to Senior Living    Long-Range Goal: Functional Decline Minimized over the next 90 days Completed 04/08/2021  Start Date: 08/25/2020  Expected End Date: 02/15/2021  This Visit's Progress: On track  Recent Progress: On track  Priority: Medium  Note:   Current Barriers:  Knowledge Deficits related to increasing strength in patient with declining functional ability as evidenced by patient reporting she has little energy and shortness of breath with minimal  activity.  Patient states she feels a lot better after surgery. Discharged from hospital on 12/15/20.  Has completed home health therapy.  Ambulating with cane.  Denies any falls.  Is needing to find new home aid assistance; encouraged to use agency for staff Unable to perform ADLs independently Unable to perform IADLs independently Care Coordination needs related to home health physical therapy  in a patient with decreasing strength  GOAL COMPLETED     04/08/21-feels strength is back to baseline; ambulating with cane at times.  No longer using home aide assistance but looking to move to [place]):  patient will demonstrate improved adherence to prescribed treatment plan for decreasing falls as evidenced by patient reporting and review of EMR patient will verbalize using fall risk reduction strategies discussed patient will not experience additional falls patient will verbalize understanding of plan for safety and increasing strength Interventions:  Collaboration with Crecencio Mc, MD regarding development and update of comprehensive plan of care as evidenced by provider attestation and co-signature Inter-disciplinary care team collaboration (see longitudinal plan of care) Discussed fall precaution and Fall prevention strategies Reviewed medications and discussed potential side effects of medications such as dizziness and frequent urination Assessed patients knowledge of fall risk prevention and discussed increasing activity slowly as tolerated Acknowledged her feelings of apprehension and reassured her physical therapist would  be there to help her and give tips Discussed possible medical reasons for decrease in strength (heart failure, mitral valve problems) Encouraged patient to continue to monitor blood pressures daily monitoring for hypotension and prevention of falls Maximum independence promoted as safely as can providing self care and seeking assistance from home  aide as necessary; instructed do not push herself to do things that feel unsafe or that she is not used to doing alone Encouraged to continue to work with home health therapy Instructed and encouraged to use cane with all ambulation Energy conservation techniques promoted Emotional support and empathy provided to patient Discussed with patient to prepare for rehabilitation services to develop an individualized activity and exercise program as indicated, such as progressive resistance strengthening, balance and activity tolerance training or home exercise program Promoted gradual increase in intensity of activity and exercise as tolerated, such as duration, frequency, exercise repetition and sets and intensity Patient Goals/Self-Care Deficits:   Continue to look for home aide assistance Continue to do exercises taught by therapist Use cane with walking, fall precautions, change positions slowly Start slowly and increase the amount of time for working out as tolerated  Do not do any activity that does not feel safe or that you normally would not do on your own (wait for some assistance from family or home aid) Follow Up Plan: The care management team will reach out to the patient again over the next 45 business days.        Plan: The care management team will reach out to the patient again over the next 45 business days.  [name] RN, MSN RN Care Management Coordinator [place] [phone] [email or web address]

## 2021-04-12 ENCOUNTER — Telehealth: Payer: Self-pay | Admitting: Internal Medicine

## 2021-04-12 NOTE — Telephone Encounter (Signed)
Per Selena Lesser from [facility] called in about Pt Linda [name] preadmission paperwork to their facility. Michelle B. Stated she sent several faxes on Oct 11,18,and 25. Sharyn Lull had wrong fax number. Advise Sharyn Lull of correct fax number. Advise Sharyn Lull paperwork would be place in Dr. Derrel Nip color folder.

## 2021-04-14 NOTE — Telephone Encounter (Signed)
Placed in red folder for completion.  

## 2021-04-15 ENCOUNTER — Encounter: Payer: Self-pay | Admitting: Internal Medicine

## 2021-04-15 DIAGNOSIS — Z0279 Encounter for issue of other medical certificate: Secondary | ICD-10-CM

## 2021-04-15 LAB — HM DIABETES EYE EXAM

## 2021-04-15 NOTE — Telephone Encounter (Signed)
Faxed

## 2021-04-18 ENCOUNTER — Ambulatory Visit: Payer: Medicare Other

## 2021-04-18 DIAGNOSIS — I10 Essential (primary) hypertension: Secondary | ICD-10-CM

## 2021-04-19 NOTE — Progress Notes (Signed)
Opened in error

## 2021-05-09 ENCOUNTER — Telehealth: Payer: Self-pay | Admitting: Internal Medicine

## 2021-05-09 NOTE — Telephone Encounter (Signed)
Attempted to call pt. No answer no voicemail.  

## 2021-05-09 NOTE — Telephone Encounter (Signed)
Pt called in stating that she reside at [facility] [place]. Pt stated that she was complain of pain in her leg. Pt had the nurse at [facility] [place] come to take a look at her leg. Pt stated that the nurse said she may have cellulitis in her leg and that she needs to see someone as soon as possible. Transfer Pt to access nurse

## 2021-05-09 NOTE — Telephone Encounter (Signed)
Attempted to call. No answer no voicemail. 

## 2021-05-10 NOTE — Telephone Encounter (Signed)
Seen at ED for Possible cellulitis  FYI.

## 2021-05-20 ENCOUNTER — Ambulatory Visit (INDEPENDENT_AMBULATORY_CARE_PROVIDER_SITE_OTHER): Payer: Medicare Other | Admitting: *Deleted

## 2021-05-20 DIAGNOSIS — I482 Chronic atrial fibrillation, unspecified: Secondary | ICD-10-CM

## 2021-05-20 DIAGNOSIS — I10 Essential (primary) hypertension: Secondary | ICD-10-CM

## 2021-05-20 DIAGNOSIS — R600 Localized edema: Secondary | ICD-10-CM

## 2021-05-22 NOTE — Patient Instructions (Signed)
Visit Information  Thank you for taking time to visit with me today. Please don't hesitate to contact me if I can be of assistance to you before our next scheduled telephone appointment.  Following are the goals we discussed today:  Call office if I gain more than 2 pounds in one day or 5 pounds in one week Keep legs up/elevated while sitting and wear support hose Watch for swelling in feet, ankles and legs every day Weigh myself daily and keep in log to take to provider for review Follow rescue plan and know when to call the doctor Continue to monitor blood pressures daily taking log to provider review/monitoring for light headiness Check blood pressures daily and notify providers for sustained elevations  Our next appointment is by telephone on 06/10/21 at 1400  Please call the care guide team at [phone] if you need to cancel or reschedule your appointment.   If you are experiencing a Mental Health or [place] or need someone to talk to, please call the Suicide and Crisis Lifeline: 988 call the [place] National Suicide Prevention Lifeline: 725 544 7301 or TTY: (339)179-3840 TTY (810)402-4682) to talk to a trained counselor call 1-800-273-TALK (toll free, 24 hour hotline) call 911   Patient verbalizes understanding of instructions provided today and agrees to view in [place].   [name] RN, MSN RN Care Management Coordinator [place] [phone] [email or web address]

## 2021-05-22 NOTE — Chronic Care Management (AMB) (Signed)
Chronic Care Management   CCM RN Visit Note  05/22/2021 Name: NAHIA NISSAN MRN: [id number] DOB: [birth year]  Subjective: [name] is a 87 y.o. year old female who is a primary care patient of Crecencio Mc, MD. The care management team was consulted for assistance with disease management and care coordination needs.    Engaged with patient by telephone for follow up visit in response to provider referral for case management and/or care coordination services.   Consent to Services:  The patient was given the following information about Chronic Care Management services today, agreed to services, and gave verbal consent: 1. CCM service includes personalized support from designated clinical staff supervised by the primary care provider, including individualized plan of care and coordination with other care providers 2. 24/7 contact phone numbers for assistance for urgent and routine care needs. 3. Service will only be billed when office clinical staff spend 20 minutes or more in a month to coordinate care. 4. Only one practitioner may furnish and bill the service in a calendar month. 5.The patient may stop CCM services at any time (effective at the end of the month) by phone call to the office staff. 6. The patient will be responsible for cost sharing (co-pay) of up to 20% of the service fee (after annual deductible is met). Patient agreed to services and consent obtained.  Patient agreed to services and verbal consent obtained.   Assessment: Review of patient past medical history, allergies, medications, health status, including review of consultants reports, laboratory and other test data, was performed as part of comprehensive evaluation and provision of chronic care management services.   SDOH (Social Determinants of Health) assessments and interventions performed:    CCM Care Plan  Allergies  Allergen Reactions   Barium Iodide     Unknown   Crestor [Rosuvastatin Calcium] Other (See  Comments)    Myalgia    Penicillins Rash   Sulfa Antibiotics Rash   Sulfonamide Derivatives Rash    Outpatient Encounter Medications as of 05/20/2021  Medication Sig Note   acetaminophen (TYLENOL) 500 MG tablet Take 500 mg by mouth every 6 (six) hours as needed for moderate pain or headache.    cetirizine (ZYRTEC) 10 MG tablet Take 10 mg by mouth daily as needed for allergies.    Cholecalciferol (VITAMIN D) 2000 units tablet Take 2,000 Units by mouth daily.    clindamycin (CLEOCIN) 300 MG capsule Take 300 mg by mouth 2 (two) times daily. 12/24/2020: As needed for Dental procedures post surgery   ELIQUIS 5 MG TABS tablet Take 1 tablet (5 mg total) by mouth 2 (two) times daily.    estradiol (ESTRACE) 0.1 MG/GM vaginal cream Place 1 g vaginally 3 (three) times a week. Pea sized amount per urethra    furosemide (LASIX) 20 MG tablet Take 40 mg by mouth daily. (Patient not taking: Reported on 04/08/2021) 04/08/2021: Reports taking as needed   Ketotifen Fumarate (ALLERGY EYE DROPS OP) Place 1 drop into both eyes daily as needed (allergies).    LORazepam (ATIVAN) 0.5 MG tablet Take 1 tablet (0.5 mg total) by mouth at bedtime as needed for anxiety.    losartan (COZAAR) 25 MG tablet Take 1 tablet (25 mg total) by mouth daily. (Patient taking differently: Take 12.5 mg by mouth at bedtime.) 08/25/2020: Reports taking 1/2 tablet nightly   metoprolol tartrate (LOPRESSOR) 25 MG tablet Take by mouth. (Patient not taking: Reported on 04/08/2021) 12/24/2020: Reports medication makes her not feel well (weak  and faint), so she takes medication occasionally 1/2 tablet   nitrofurantoin, macrocrystal-monohydrate, (MACROBID) 100 MG capsule Take 1 capsule (100 mg total) by mouth 2 (two) times daily. (Patient not taking: Reported on 04/08/2021) 04/08/2021: completed   potassium chloride (KLOR-CON) 10 MEQ tablet Take 10-20 mEq by mouth daily. (Patient not taking: Reported on 04/08/2021) 12/24/2020: Medication discontinued after  surgery   spironolactone (ALDACTONE) 25 MG tablet Take 1 tablet by mouth daily. (Patient not taking: Reported on 04/08/2021)    traZODone (DESYREL) 50 MG tablet Take 50 mg by mouth at bedtime.    No facility-administered encounter medications on file as of 05/20/2021.    Patient Active Problem List   Diagnosis Date Noted   Proximal muscle weakness 08/24/2020   Mitral valve disease 07/09/2020   Fatigue 05/29/2020   Diuretic-induced hypokalemia 12/16/2019   Myalgia due to statin 12/15/2019   Acquired thrombophilia ([place]) 12/15/2019   Skin lesion of hand 08/27/2018   Hospital discharge follow-up 05/25/2018   History of CVA (cerebrovascular accident) without residual deficits 05/10/2018   Parent-child estrangement nec 02/09/2018   Basal cell carcinoma (BCC) of right lower extremity 08/08/2017   Hematuria, gross 08/08/2017   Extremity atherosclerosis with intermittent claudication ([place]) 08/08/2017   Generalized anxiety disorder 01/31/2017   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   Controlled type 2 diabetes mellitus with microalbuminuria, without long-term current use of insulin ([place]) 01/20/2016   Insomnia secondary to anxiety 10/09/2015   Cardiomyopathy, idiopathic ([place]) 09/03/2015   Polycythemia, secondary 07/04/2015   Pre-syncope 07/02/2015   Osteopenia 08/21/2014   Gastritis 05/26/2014   GERD (gastroesophageal reflux disease) 03/04/2014   Medicare annual wellness visit, subsequent 01/13/2014   Essential hypertension 11/04/2013   Screening for breast cancer 07/01/2013   Left shoulder pain 03/21/2013   Hoarseness of voice 03/21/2013   Edema 09/05/2010   VENTRICULAR HYPERTROPHY, LEFT 08/16/2010   Hyperlipidemia 06/03/2010   ATRIAL FIBRILLATION 08/13/2009    Conditions to be addressed/monitored:Atrial Fibrillation, CHF, and HTN  Care Plan : Heart Failure  Updates made by Leona Singleton, RN since 05/22/2021 12:00 AM     Problem: Heart Failure (increasing edema and shortness of  breath   Priority: Medium     Long-Range Goal: Symptom Exacerbation Prevented or Minimized   Start Date: 08/25/2020  Expected End Date: 06/17/2021  Recent Progress: On track  Priority: Medium  Note:   Current Barriers:   Knowledge deficits related to basic heart failure pathophysiology and self care management as evidenced by increasing shortness of breath with exertion and increase in lower extremity edema.  Patient underwent mitral valve clipping end of June.  Discharged from [place] on 12/15/20.  Continues to repot report she feels much better since since surgery.  Denies any shortness of breath or  lower extremity edema.  Weight this morning 161 pounds.  States blood pressures have been elevated (161/90).  Patient now lives in Senior Living at the [facility] at [place].  Reports she bumped her leg few days/weeks ago.  Was treated for cellulitis; states it feels better but still swollen and bruised.  States she is getting settled in to her new home and environment.  Ambulating now with rolling walker and feels she is getting around quicker and more efficiently.  Fall precautions and preventions reviewed and discussed Unable to perform ADLs independently Unable to perform IADLs independently Financial strain-patient reporting her Eliquis copay has increased to $100 monthly.   Nurse Case Manager Clinical Goal(s):  Patient will report no hospitalization related to heart failure  exacerbation within the next 90 days. patient will weigh self daily and record patient will verbalize understanding of Heart Failure Action Plan and when to call doctor patient will take all Heart Failure mediations as prescribed Patient will continue to monitor heart rates and notify provider if elevated heart rates sustains Interventions: T  Heart Failure   Goal on rack (progressing): YES. Collaboration with Crecencio Mc, MD regarding development and update of comprehensive plan of care as evidenced by provider  attestation and co-signature Inter-disciplinary care team collaboration (see longitudinal plan of care) Basic overview and discussion of pathophysiology of Heart Failure Reviewed Heart Failure Action Plan in depth and provided written copy; encouraged patient to review Encouraged continued daily weight monitoring, documenting in log for provider review Reviewed medications and encouraged medication compliance; discussed timing of taking second fluid pill  Reviewed role of diuretics in prevention of fluid overload and dosage of Lasix (patient verbalized her understanding and states taking 1 tablets daily) Discussed increased heart rates and atrial fibrillation and encouraged patient to continue to monitor feelings of rapid rates and to notify provider if rates sustains                                                                                                                                                                                                         Self-awareness of signs/symptoms of worsening disease encouraged Sent heart failure zones and action plan education; sent EMMI Video for Atrial Fibrillation and Heart Failure rest and exercise and encouraged patient to review Encouraged patient to continue to use 2022 Calendar Booklet for documentation of vital signs Encouraged to attend upcoming appointments  and schedule follow up with PCP ASAP Fall precautions and preventions reviewed and discussed  A-fib:  (Status: New goal.) Long Term Goal  Counseled on increased risk of stroke due to Afib and benefits of anticoagulation for stroke prevention           Reviewed importance of adherence to anticoagulant exactly as prescribed Counseled on seeking medical attention after a head injury or if there is blood in the urine/stool Afib action plan reviewed    Hypertension: (Status: New goal.)  Long Term Goal  Last practice recorded BP readings:  BP Readings from Last 3 Encounters:   03/22/21 124/78  12/29/20 124/86  09/29/20 125/75  Most recent GFR/CrCl: No results found for: EGFR  No components found for: CRCL  Evaluation of current treatment plan related to hypertension self management and patient's adherence to plan as established by provider;   Reviewed prescribed diet low salt  Reviewed medications with patient and discussed importance of compliance;  Discussed plans with patient for ongoing care management follow up and provided patient with direct contact information for care management team; Advised patient, providing education and rationale, to monitor blood pressure daily and record, calling PCP for findings outside established parameters;  Reviewed scheduled/upcoming provider appointments including:  Advised patient to discuss hematoma to back 105fleg with provider;  Patient Goals/Self-Care Activities:  Call office if I gain more than 2 pounds in one day or 5 pounds in one week Keep legs up/elevated while sitting and wear support hose Watch for swelling in feet, ankles and legs every day Weigh myself daily and keep in log to take to provider for review Follow rescue plan and know when to call the doctor Continue to monitor blood pressures daily taking log to provider review/monitoring for light headiness Check blood pressures daily and notify providers for sustained elevations Follow Up Plan: The care management team will reach out to the patient again over the next 45 business days.        Plan:The care management team will reach out to the patient again over the next 45 days.  FHubert AzureRN, MSN RN Care Management Coordinator [email or web address]

## 2021-06-10 ENCOUNTER — Ambulatory Visit: Payer: Medicare Other | Admitting: *Deleted

## 2021-06-10 DIAGNOSIS — I429 Cardiomyopathy, unspecified: Secondary | ICD-10-CM

## 2021-06-10 DIAGNOSIS — I10 Essential (primary) hypertension: Secondary | ICD-10-CM

## 2021-06-10 NOTE — Chronic Care Management (AMB) (Signed)
Chronic Care Management   CCM RN Visit Note  06/10/2021 Name: Linda [name] MRN: [id number] DOB: [birth year]  Subjective: Linda [name] is a 87 y.o. year old female who is a primary care patient of Crecencio Mc, MD. The care management team was consulted for assistance with disease management and care coordination needs.    Engaged with patient by telephone for follow up visit in response to provider referral for case management and/or care coordination services.   Consent to Services:  The patient was given information about Chronic Care Management services, agreed to services, and gave verbal consent prior to initiation of services.  Please see initial visit note for detailed documentation.   Patient agreed to services and verbal consent obtained.   Assessment: Review of patient past medical history, allergies, medications, health status, including review of consultants reports, laboratory and other test data, was performed as part of comprehensive evaluation and provision of chronic care management services.   SDOH (Social Determinants of Health) assessments and interventions performed:    CCM Care Plan  Allergies  Allergen Reactions   Barium Iodide     Unknown   Crestor [Rosuvastatin Calcium] Other (See Comments)    Myalgia    Penicillins Rash   Sulfa Antibiotics Rash   Sulfonamide Derivatives Rash    Outpatient Encounter Medications as of 06/10/2021  Medication Sig Note   acetaminophen (TYLENOL) 500 MG tablet Take 500 mg by mouth every 6 (six) hours as needed for moderate pain or headache.    cetirizine (ZYRTEC) 10 MG tablet Take 10 mg by mouth daily as needed for allergies.    Cholecalciferol (VITAMIN D) 2000 units tablet Take 2,000 Units by mouth daily.    clindamycin (CLEOCIN) 300 MG capsule Take 300 mg by mouth 2 (two) times daily. 12/24/2020: As needed for Dental procedures post surgery   ELIQUIS 5 MG TABS tablet Take 1 tablet (5 mg total) by mouth 2 (two) times  daily.    estradiol (ESTRACE) 0.1 MG/GM vaginal cream Place 1 g vaginally 3 (three) times a week. Pea sized amount per urethra    furosemide (LASIX) 20 MG tablet Take 40 mg by mouth daily. (Patient not taking: Reported on 04/08/2021) 04/08/2021: Reports taking as needed   Ketotifen Fumarate (ALLERGY EYE DROPS OP) Place 1 drop into both eyes daily as needed (allergies).    LORazepam (ATIVAN) 0.5 MG tablet Take 1 tablet (0.5 mg total) by mouth at bedtime as needed for anxiety.    losartan (COZAAR) 25 MG tablet Take 1 tablet (25 mg total) by mouth daily. (Patient taking differently: Take 12.5 mg by mouth at bedtime.) 08/25/2020: Reports taking 1/2 tablet nightly   metoprolol tartrate (LOPRESSOR) 25 MG tablet Take by mouth. (Patient not taking: Reported on 04/08/2021) 12/24/2020: Reports medication makes her not feel well (weak and faint), so she takes medication occasionally 1/2 tablet   nitrofurantoin, macrocrystal-monohydrate, (MACROBID) 100 MG capsule Take 1 capsule (100 mg total) by mouth 2 (two) times daily. (Patient not taking: Reported on 04/08/2021) 04/08/2021: completed   potassium chloride (KLOR-CON) 10 MEQ tablet Take 10-20 mEq by mouth daily. (Patient not taking: Reported on 04/08/2021) 12/24/2020: Medication discontinued after surgery   spironolactone (ALDACTONE) 25 MG tablet Take 1 tablet by mouth daily. (Patient not taking: Reported on 04/08/2021)    traZODone (DESYREL) 50 MG tablet Take 50 mg by mouth at bedtime.    No facility-administered encounter medications on file as of 06/10/2021.    Patient Active Problem List  Diagnosis Date Noted   Proximal muscle weakness 08/24/2020   Mitral valve disease 07/09/2020   Fatigue 05/29/2020   Diuretic-induced hypokalemia 12/16/2019   Myalgia due to statin 12/15/2019   Acquired thrombophilia ([place]) 12/15/2019   Skin lesion of hand 08/27/2018   Hospital discharge follow-up 05/25/2018   History of CVA (cerebrovascular accident) without residual  deficits 05/10/2018   Parent-child estrangement nec 02/09/2018   Basal cell carcinoma (BCC) of right lower extremity 08/08/2017   Hematuria, gross 08/08/2017   Extremity atherosclerosis with intermittent claudication ([place]) 08/08/2017   Generalized anxiety disorder 01/31/2017   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   Controlled type 2 diabetes mellitus with microalbuminuria, without long-term current use of insulin ([place]) 01/20/2016   Insomnia secondary to anxiety 10/09/2015   Cardiomyopathy, idiopathic ([place]) 09/03/2015   Polycythemia, secondary 07/04/2015   Pre-syncope 07/02/2015   Osteopenia 08/21/2014   Gastritis 05/26/2014   GERD (gastroesophageal reflux disease) 03/04/2014   Medicare annual wellness visit, subsequent 01/13/2014   Essential hypertension 11/04/2013   Screening for breast cancer 07/01/2013   Left shoulder pain 03/21/2013   Hoarseness of voice 03/21/2013   Edema 09/05/2010   VENTRICULAR HYPERTROPHY, LEFT 08/16/2010   Hyperlipidemia 06/03/2010   ATRIAL FIBRILLATION 08/13/2009    Conditions to be addressed/monitored:Atrial Fibrillation, CHF, and HTN  Care Plan : Heart Failure  Updates made by Leona Singleton, RN since 06/10/2021 12:00 AM     Problem: Heart Failure (increasing edema and shortness of breath   Priority: Medium     Long-Range Goal: Symptom Exacerbation Prevented or Minimized   Start Date: 08/25/2020  Expected End Date: 06/17/2021  Recent Progress: On track  Priority: Medium  Note:   Current Barriers:   Knowledge deficits related to basic heart failure pathophysiology and self care management as evidenced by increasing shortness of breath with exertion and increase in lower extremity edema.  Patient underwent mitral valve clipping end of June.  Discharged from [place] on 12/15/20.  Continues to repot report she feels much better since since surgery.  Denies any shortness of breath or  lower extremity edema.  Weight this morning 162 pounds.  States blood  pressures have been elevated (161/90-100'S) but they are now adjusting timing if her medications.  Patient continues to settle into her Senior Living at the [facility] at [place].  Reports hematoma to her leg continues to be swollen, no increase in size; not open nor draining. Treats pain with ice and heat alternating.  Fall precautions and preventions reviewed and discussed Unable to perform ADLs independently Unable to perform IADLs independently Financial strain-patient reporting her Eliquis copay has increased to $100 monthly.   Nurse Case Manager Clinical Goal(s):  Patient will report no hospitalization related to heart failure exacerbation within the next 90 days. patient will weigh self daily and record patient will verbalize understanding of Heart Failure Action Plan and when to call doctor patient will take all Heart Failure mediations as prescribed Patient will continue to monitor heart rates and notify provider if elevated heart rates sustains Interventions: T  Heart Failure   Goal on rack (progressing): YES. Collaboration with Crecencio Mc, MD regarding development and update of comprehensive plan of care as evidenced by provider attestation and co-signature Inter-disciplinary care team collaboration (see longitudinal plan of care) Basic overview and discussion of pathophysiology of Heart Failure Reviewed Heart Failure Action Plan in depth and provided written copy; encouraged patient to review Encouraged continued daily weight monitoring, documenting in log for provider review Reviewed medications and encouraged  medication compliance; discussed timing of taking second fluid pill  Reviewed role of diuretics in prevention of fluid overload and dosage of Lasix (patient verbalized her understanding and states taking 1 tablets daily) Discussed increased heart rates and atrial fibrillation and encouraged patient to continue to monitor feelings of rapid rates and to notify provider if  rates sustains                                                                                                                                                                                                         Self-awareness of signs/symptoms of worsening disease encouraged Sent heart failure zones and action plan education; sent EMMI Video for Atrial Fibrillation and Heart Failure rest and exercise and encouraged patient to review Encouraged patient to continue to use 2022 Calendar Booklet for documentation of vital signs Encouraged to schedule follow up with PCP ASAP Fall precautions and preventions reviewed and discussed  A-fib:  (Status: Goal on Track (progressing): YES. Condition stable. Not addressed this visit.) Long Term Goal  Counseled on increased risk of stroke due to Afib and benefits of anticoagulation for stroke prevention           Reviewed importance of adherence to anticoagulant exactly as prescribed Counseled on seeking medical attention after a head injury or if there is blood in the urine/stool Afib action plan reviewed    Hypertension: (Status: Goal on track: NO.)  Long Term Goal  Last practice recorded BP readings:  BP Readings from Last 3 Encounters:  03/22/21 124/78  12/29/20 124/86  09/29/20 125/75  Most recent GFR/CrCl: No results found for: EGFR  No components found for: CRCL  Evaluation of current treatment plan related to hypertension self management and patient's adherence to plan as established by provider;   Reviewed prescribed diet low salt Reviewed medications with patient and discussed importance of compliance;  Discussed plans with patient for ongoing care management follow up and provided patient with direct contact information for care management team; Advised patient, providing education and rationale, to monitor blood pressure daily and record, calling PCP for findings outside established parameters;  Reviewed scheduled/upcoming provider  appointments including:  Advised patient to discuss hematoma to back 10fleg with provider;  Patient Goals/Self-Care Activities:  Call office if I gain more than 2 pounds in one day or 5 pounds in one week Keep legs up/elevated while sitting and wear support hose Watch for swelling in feet, ankles and legs every day Weigh myself daily and keep in log to take to provider for review Follow rescue plan  and know when to call the doctor Continue to monitor blood pressures daily taking log to provider review/monitoring for light headiness Check blood pressures daily and notify providers for sustained elevations Follow Up Plan: The care management team will reach out to the patient again over the next 45 business days.        Plan:The care management team will reach out to the patient again over the next 45 days.  [name] RN, MSN RN Care Management Coordinator [place] [phone] [email or web address]

## 2021-06-10 NOTE — Patient Instructions (Addendum)
Visit Information  Thank you for taking time to visit with me today. Please don't hesitate to contact me if I can be of assistance to you before our next scheduled telephone appointment.  Following are the goals we discussed today:  Call office if I gain more than 2 pounds in one day or 5 pounds in one week Keep legs up/elevated while sitting and wear support hose Watch for swelling in feet, ankles and legs every day Weigh myself daily and keep in log to take to provider for review Follow rescue plan and know when to call the doctor Continue to monitor blood pressures daily taking log to provider review/monitoring for light headiness Check blood pressures daily and notify providers for sustained elevations  Our next appointment is by telephone on 07/01/21 at 1100  Please call the care guide team at [phone] if you need to cancel or reschedule your appointment.   If you are experiencing a Mental Health or [place] or need someone to talk to, please call the Suicide and Crisis Lifeline: 988 call the [place] National Suicide Prevention Lifeline: (701) 692-7774 or TTY: 203-375-7232 TTY 9490561502) to talk to a trained counselor call 1-800-273-TALK (toll free, 24 hour hotline) call 911   Patient verbalizes understanding of instructions provided today and agrees to view in [place].   [name] RN, MSN RN Care Management Coordinator [place] [phone] [email or web address]

## 2021-06-18 DIAGNOSIS — I10 Essential (primary) hypertension: Secondary | ICD-10-CM | POA: Diagnosis not present

## 2021-06-18 DIAGNOSIS — I482 Chronic atrial fibrillation, unspecified: Secondary | ICD-10-CM | POA: Diagnosis not present

## 2021-06-21 ENCOUNTER — Ambulatory Visit: Payer: Medicare Other

## 2021-07-01 ENCOUNTER — Telehealth: Payer: Medicare Other

## 2021-07-01 ENCOUNTER — Telehealth: Payer: Self-pay | Admitting: *Deleted

## 2021-07-01 NOTE — Telephone Encounter (Signed)
°  Care Management   Follow Up Note   07/01/2021 Name: Linda [name] MRN: [id number] DOB: [birth year]   Referred by: Crecencio Mc, MD Reason for referral : Chronic Care Management (HTN, CHF)   An unsuccessful telephone outreach was attempted today. The patient was referred to the case management team for assistance with care management and care coordination.   Follow Up Plan: The care management team will reach out to the patient again over the next 30 days.   [name] RN, MSN RN Care Management Coordinator [place] [phone] [email or web address]

## 2021-07-04 ENCOUNTER — Ambulatory Visit (INDEPENDENT_AMBULATORY_CARE_PROVIDER_SITE_OTHER): Payer: Medicare Other | Admitting: *Deleted

## 2021-07-04 DIAGNOSIS — I482 Chronic atrial fibrillation, unspecified: Secondary | ICD-10-CM

## 2021-07-04 DIAGNOSIS — I429 Cardiomyopathy, unspecified: Secondary | ICD-10-CM

## 2021-07-04 NOTE — Chronic Care Management (AMB) (Signed)
Chronic Care Management   CCM RN Visit Note  07/04/2021 Name: Linda [name] MRN: [id number] DOB: June 11, 1934  Subjective: Linda [name] is a 88 y.o. year old female who is a primary care patient of Crecencio Mc, MD. The care management team was consulted for assistance with disease management and care coordination needs.    Engaged with patient face to face for follow up visit in response to provider referral for case management and/or care coordination services.   Consent to Services:  The patient was given information about Chronic Care Management services, agreed to services, and gave verbal consent prior to initiation of services.  Please see initial visit note for detailed documentation.   Patient agreed to services and verbal consent obtained.   Assessment: Review of patient past medical history, allergies, medications, health status, including review of consultants reports, laboratory and other test data, was performed as part of comprehensive evaluation and provision of chronic care management services.   SDOH (Social Determinants of Health) assessments and interventions performed:    CCM Care Plan  Allergies  Allergen Reactions   Barium Iodide     Unknown   Crestor [Rosuvastatin Calcium] Other (See Comments)    Myalgia    Penicillins Rash   Sulfa Antibiotics Rash   Sulfonamide Derivatives Rash    Outpatient Encounter Medications as of 07/04/2021  Medication Sig Note   acetaminophen (TYLENOL) 500 MG tablet Take 500 mg by mouth every 6 (six) hours as needed for moderate pain or headache.    cetirizine (ZYRTEC) 10 MG tablet Take 10 mg by mouth daily as needed for allergies.    Cholecalciferol (VITAMIN D) 2000 units tablet Take 2,000 Units by mouth daily.    clindamycin (CLEOCIN) 300 MG capsule Take 300 mg by mouth 2 (two) times daily. 12/24/2020: As needed for Dental procedures post surgery   ELIQUIS 5 MG TABS tablet Take 1 tablet (5 mg total) by mouth 2 (two) times  daily.    estradiol (ESTRACE) 0.1 MG/GM vaginal cream Place 1 g vaginally 3 (three) times a week. Pea sized amount per urethra    furosemide (LASIX) 20 MG tablet Take 40 mg by mouth daily. (Patient not taking: Reported on 04/08/2021) 04/08/2021: Reports taking as needed   Ketotifen Fumarate (ALLERGY EYE DROPS OP) Place 1 drop into both eyes daily as needed (allergies).    LORazepam (ATIVAN) 0.5 MG tablet Take 1 tablet (0.5 mg total) by mouth at bedtime as needed for anxiety.    losartan (COZAAR) 25 MG tablet Take 1 tablet (25 mg total) by mouth daily. (Patient taking differently: Take 12.5 mg by mouth at bedtime.) 08/25/2020: Reports taking 1/2 tablet nightly   metoprolol tartrate (LOPRESSOR) 25 MG tablet Take by mouth. (Patient not taking: Reported on 04/08/2021) 12/24/2020: Reports medication makes her not feel well (weak and faint), so she takes medication occasionally 1/2 tablet   nitrofurantoin, macrocrystal-monohydrate, (MACROBID) 100 MG capsule Take 1 capsule (100 mg total) by mouth 2 (two) times daily. (Patient not taking: Reported on 04/08/2021) 04/08/2021: completed   potassium chloride (KLOR-CON) 10 MEQ tablet Take 10-20 mEq by mouth daily. (Patient not taking: Reported on 04/08/2021) 12/24/2020: Medication discontinued after surgery   spironolactone (ALDACTONE) 25 MG tablet Take 1 tablet by mouth daily. (Patient not taking: Reported on 04/08/2021)    traZODone (DESYREL) 50 MG tablet Take 50 mg by mouth at bedtime.    No facility-administered encounter medications on file as of 07/04/2021.    Patient Active Problem List  Diagnosis Date Noted   Proximal muscle weakness 08/24/2020   Mitral valve disease 07/09/2020   Fatigue 05/29/2020   Diuretic-induced hypokalemia 12/16/2019   Myalgia due to statin 12/15/2019   Acquired thrombophilia ([place]) 12/15/2019   Skin lesion of hand 08/27/2018   Hospital discharge follow-up 05/25/2018   History of CVA (cerebrovascular accident) without residual  deficits 05/10/2018   Parent-child estrangement nec 02/09/2018   Basal cell carcinoma (BCC) of right lower extremity 08/08/2017   Hematuria, gross 08/08/2017   Extremity atherosclerosis with intermittent claudication ([place]) 08/08/2017   Generalized anxiety disorder 01/31/2017   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   Controlled type 2 diabetes mellitus with microalbuminuria, without long-term current use of insulin ([place]) 01/20/2016   Insomnia secondary to anxiety 10/09/2015   Cardiomyopathy, idiopathic ([place]) 09/03/2015   Polycythemia, secondary 07/04/2015   Pre-syncope 07/02/2015   Osteopenia 08/21/2014   Gastritis 05/26/2014   GERD (gastroesophageal reflux disease) 03/04/2014   Medicare annual wellness visit, subsequent 01/13/2014   Essential hypertension 11/04/2013   Screening for breast cancer 07/01/2013   Left shoulder pain 03/21/2013   Hoarseness of voice 03/21/2013   Edema 09/05/2010   VENTRICULAR HYPERTROPHY, LEFT 08/16/2010   Hyperlipidemia 06/03/2010   ATRIAL FIBRILLATION 08/13/2009    Conditions to be addressed/monitored:Atrial Fibrillation and CHF  Care Plan : Heart Failure  Updates made by Leona Singleton, RN since 07/04/2021 12:00 AM     Problem: Heart Failure (increasing edema and shortness of breath   Priority: Medium     Long-Range Goal: Symptom Exacerbation Prevented or Minimized   Start Date: 08/25/2020  Expected End Date: 06/17/2021  Recent Progress: On track  Priority: Medium  Note:   Current Barriers:  e Knowledge deficits related to basic heart failure pathophysiology and self care management as evidenced by increasing shortness of breath with exertion and increase in lower extremity edema.  Spoke with patient briefly as she had S pryer engagement.  Patient concerned about residual hematoma to leg; would like to see provider.  Assisted patient in scheduling appointment in primary office for 1/18 Unable to perform ADLs independently Unable to perform IADLs  independently Financial strain-patient reporting her Eliquis copay has increased to $100 monthly.   Nurse Case Manager Clinical Goal(s):  Patient will report no hospitalization related to heart failure exacerbation within the next 90 days. patient will weigh self daily and record patient will verbalize understanding of Heart Failure Action Plan and when to call doctor patient will take all Heart Failure mediations as prescribed Patient will continue to monitor heart rates and notify provider if elevated heart rates sustains Interventions: T  Heart Failure   Goal on rack (progressing): YES. Collaboration with Crecencio Mc, MD regarding development and update of comprehensive plan of care as evidenced by provider attestation and co-signature Inter-disciplinary care team collaboration (see longitudinal plan of care) Basic overview and discussion of pathophysiology of Heart Failure Reviewed Heart Failure Action Plan in depth and provided written copy; encouraged patient to review Encouraged continued daily weight monitoring, documenting in log for provider review Reviewed medications and encouraged medication compliance; discussed timing of taking second fluid pill  Reviewed role of diuretics in prevention of fluid overload and dosage of Lasix (patient verbalized her understanding and states taking 1 tablets daily) Discussed increased heart rates and atrial fibrillation and encouraged patient to continue to monitor feelings of rapid rates and to notify provider if rates sustains  Self-awareness of signs/symptoms of worsening disease encouraged Sent heart failure zones and action plan education; sent EMMI Video for Atrial Fibrillation and Heart Failure rest and exercise and encouraged patient to  review Encouraged patient to continue to use 2022 Calendar Booklet for documentation of vital signs Encouraged to schedule follow up with PCP ASAP Fall precautions and preventions reviewed and discussed  A-fib:  (Status: Goal on Track (progressing): YES. Condition stable. Not addressed this visit.) Long Term Goal  Counseled on increased risk of stroke due to Afib and benefits of anticoagulation for stroke prevention           Reviewed importance of adherence to anticoagulant exactly as prescribed Counseled on seeking medical attention after a head injury or if there is blood in the urine/stool Afib action plan reviewed    Hypertension: (Status: Goal on track: NO.)  Long Term Goal  Last practice recorded BP readings:  BP Readings from Last 3 Encounters:  03/22/21 124/78  12/29/20 124/86  09/29/20 125/75  Most recent GFR/CrCl: No results found for: EGFR  No components found for: CRCL  Evaluation of current treatment plan related to hypertension self management and patient's adherence to plan as established by provider;   Reviewed prescribed diet low salt Reviewed medications with patient and discussed importance of compliance;  Discussed plans with patient for ongoing care management follow up and provided patient with direct contact information for care management team; Advised patient, providing education and rationale, to monitor blood pressure daily and record, calling PCP for findings outside established parameters;  Reviewed scheduled/upcoming provider appointments including:  Advised patient to discuss hematoma to back [id number] with provider;  Patient Goals/Self-Care Activities:  Call office if I gain more than 2 pounds in one day or 5 pounds in one week Keep legs up/elevated while sitting and wear support hose Watch for swelling in feet, ankles and legs every day Weigh myself daily and keep in log to take to provider for review Follow rescue plan and know when to call the  doctor Continue to monitor blood pressures daily taking log to provider review/monitoring for light headiness Check blood pressures daily and notify providers for sustained elevations Follow Up Plan: The care management team will reach out to the patient again over the next 45 business days.        Plan:The care management team will reach out to the patient again over the next 30 days.  FHubert AzureRN, MSN RN Care Management Coordinator [email or web address]

## 2021-07-04 NOTE — Patient Instructions (Addendum)
Visit Information  Thank you for taking time to visit with me today. Please don't hesitate to contact me if I can be of assistance to you before our next scheduled telephone appointment.  Following are the goals we discussed today:  Call office if I gain more than 2 pounds in one day or 5 pounds in one week Keep legs up/elevated while sitting and wear support hose Watch for swelling in feet, ankles and legs every day Weigh myself daily and keep in log to take to provider for review Follow rescue plan and know when to call the doctor Continue to monitor blood pressures daily taking log to provider review/monitoring for light headiness Check blood pressures daily and notify providers for sustained elevationsCopy and paste patient goals from clinical care plan here)  Our next appointment is by telephone on 1/27 at 1315  Please call the care guide team at [phone] if you need to cancel or reschedule your appointment.   If you are experiencing a Mental Health or [place] or need someone to talk to, please call the Suicide and Crisis Lifeline: 988 call the [place] National Suicide Prevention Lifeline: 763-161-2548 or TTY: 564-792-2137 TTY 503-300-7586) to talk to a trained counselor call 1-800-273-TALK (toll free, 24 hour hotline) call 911   Patient verbalizes understanding of instructions and care plan provided today and agrees to view in [place]. Active MyChart status confirmed with patient.    [name] RN, MSN RN Care Management Coordinator [place] [phone] [email or web address]

## 2021-07-06 ENCOUNTER — Ambulatory Visit (INDEPENDENT_AMBULATORY_CARE_PROVIDER_SITE_OTHER): Payer: Medicare Other

## 2021-07-06 ENCOUNTER — Telehealth: Payer: Self-pay | Admitting: Internal Medicine

## 2021-07-06 ENCOUNTER — Encounter: Payer: Self-pay | Admitting: Adult Health

## 2021-07-06 ENCOUNTER — Other Ambulatory Visit: Payer: Self-pay

## 2021-07-06 ENCOUNTER — Other Ambulatory Visit: Payer: Medicare Other

## 2021-07-06 ENCOUNTER — Ambulatory Visit (INDEPENDENT_AMBULATORY_CARE_PROVIDER_SITE_OTHER): Payer: Medicare Other | Admitting: Adult Health

## 2021-07-06 VITALS — BP 128/86 | HR 64 | Ht 63.5 in | Wt 166.8 lb

## 2021-07-06 DIAGNOSIS — M79604 Pain in right leg: Secondary | ICD-10-CM

## 2021-07-06 DIAGNOSIS — S8991XA Unspecified injury of right lower leg, initial encounter: Secondary | ICD-10-CM | POA: Diagnosis not present

## 2021-07-06 DIAGNOSIS — L84 Corns and callosities: Secondary | ICD-10-CM

## 2021-07-06 MED ORDER — CLINDAMYCIN HCL 300 MG PO CAPS: 300.0000 mg | ORAL_CAPSULE | Freq: Three times a day (TID) | ORAL | 0 refills | Status: AC

## 2021-07-06 NOTE — Telephone Encounter (Signed)
I spoke with pt to sch. I asked pt if she was able to make appt today pt stated no. Pt stated she's able to make appt on 07/08/2021 at 10 pt wanted. I called scheduling the appt avail on 07/08/2021 is at 2:15 or at 4 pt states she can not do either one of those.   Pt stated that she was not advised that she needed a STAT US today pt stated she was told where to check out at. Pt states she will wait until the xray comes back.   I informed pt that the provider placed a STAT US and that she wanted it to be done to check and make sure everything is ok. Pt stated she made this appt in November and it was not scheduled as a urgent appt then.   Pt said she will call me back to sch once the results come in.

## 2021-07-06 NOTE — Telephone Encounter (Signed)
Lft pt vm to call ofc to sch STAT US. thanks

## 2021-07-06 NOTE — Assessment & Plan Note (Signed)
Reports hit lower right leg with wood and not healing completley, she was on clindamycin by cardiology in November, leg is still tender with mild erythema,and has iron staining, DVT US, probiotic for 3 weeks, clindamycin and labs today, if no improvement in one week from 07/06/21 consider dermatology.

## 2021-07-06 NOTE — Assessment & Plan Note (Addendum)
questionable callus versus plantar wart she has been picking at area and has no erythema but heard to determine due to patients previous manipulation.

## 2021-07-06 NOTE — Progress Notes (Signed)
Acute Office Visit  Subjective:    Patient ID: Linda [name], female    DOB: [birth year], 88 y.o.   MRN: [id number]  Chief Complaint  Patient presents with   Leg Pain    Leg Pain   Patient is in today for right lower leg that is warm to the touch, since November when she hit it on the piece of wood in November 2022.She has not seen anyone or this. She did see cardiologist in November that gave her clindamycin, she tolerated well and her leg did improve.  She has one area that seems to be coming back and not wanting to heal. This is all on right anterior leg. She says only the one area on right lower leg where wood hit is not healing as well.   She has moved to [facility] and had cold compresses, and ice.   Last tetanus was 03/27/2012 and she declined update today.   Chronic  a fib on eliquis.  Denies any bleeding.   Patient  denies any fever, body aches,chills, rash, chest pain, any new shortness of breath, nausea, vomiting, or diarrhea.  Denies dizziness, lightheadedness, pre syncopal or syncopal episodes.    Past Medical History:  Diagnosis Date   Anxiety    Arrhythmia    paroxysmal atrial fibrillation   CHF (congestive heart failure) (HCC)    Colon polyp    Diverticulitis    Dysrhythmia    GERD (gastroesophageal reflux disease)    Hard of hearing    Hoarseness    Hyperlipidemia    Hypertension    Kidney stone    Motion sickness    Parathyroid disease (HCC)    Parathyroidectomy    PONV (postoperative nausea and vomiting)    Pre-diabetes    PVC (premature ventricular contraction)    Skin cancer    Stroke ([place])    TIA (transient ischemic attack) 04/2018   no deficitis   UTI (lower urinary tract infection)    Varicose veins of both lower extremities    Wears hearing aid in both ears     Past Surgical History:  Procedure Laterality Date   ABDOMINAL HYSTERECTOMY     APPENDECTOMY     CATARACT EXTRACTION W/PHACO Left 03/05/2019   Procedure: CATARACT EXTRACTION  PHACO AND INTRAOCULAR LENS PLACEMENT ([place])   0:57 15.6% 9.05;  Surgeon: Leandrew Koyanagi, MD;  Location: [place];  Service: Ophthalmology;  Laterality: Left;  Diabetic - diet cotrolled   CATARACT EXTRACTION W/PHACO Right 04/09/2019   Procedure: CATARACT EXTRACTION PHACO AND INTRAOCULAR LENS PLACEMENT ([facility]) RIGHT DIABETIC 00:47.6  15.9%  7.60;  Surgeon: Leandrew Koyanagi, MD;  Location: [place];  Service: Ophthalmology;  Laterality: Right;   CHOLECYSTECTOMY  2002   ESOPHAGOGASTRODUODENOSCOPY (EGD) WITH PROPOFOL N/A 12/17/2019   Procedure: ESOPHAGOGASTRODUODENOSCOPY (EGD) WITH PROPOFOL;  Surgeon: Robert Bellow, MD;  Location: [facility];  Service: Endoscopy;  Laterality: N/A;   LITHOTRIPSY     MITRAL VALVE REPAIR  12/14/2020   PARATHYROIDECTOMY  2004   1 removed   ROTATOR CUFF REPAIR  2002   TEE WITHOUT CARDIOVERSION N/A 09/29/2020   Procedure: TRANSESOPHAGEAL ECHOCARDIOGRAM (TEE);  Surgeon: Teodoro Spray, MD;  Location: [facility];  Service: Cardiovascular;  Laterality: N/A;   TONSILLECTOMY  1956   TONSILLECTOMY     TOTAL ABDOMINAL HYSTERECTOMY W/ BILATERAL SALPINGOOPHORECTOMY  1984   VEIN LIGATION AND STRIPPING      Family History  Problem Relation Age of Onset   Heart disease  Brother    Stroke Mother    Skin cancer Father    Breast cancer Paternal Grandmother     Social History   Socioeconomic History   Marital status: Widowed    Spouse name: [name]   Number of children: 2   Years of education: 12   Highest education level: Not on file  Occupational History   Occupation: Product/process development scientist: RETIRED    Comment: Retired  Tobacco Use   Smoking status: Never   Smokeless tobacco: Never  Vaping Use   Vaping Use: Never used  Substance and Sexual Activity   Alcohol use: No   Drug use: No   Sexual activity: Not Currently    Birth control/protection: None  Other Topics Concern   Not on file  Social History Narrative   Linda [name] is  from [facility] and [place]. Recently widowed. She was married to [place], her husband for 59 years. They have a daughter and a son. She enjoys gardening.   Social Determinants of Health   Financial Resource Strain: Low Risk    Difficulty of Paying Living Expenses: Not very hard  Food Insecurity: No Food Insecurity   Worried About Charity fundraiser in the Last Year: Never true   Ran Out of Food in the Last Year: Never true  Transportation Needs: No Transportation Needs   Lack of Transportation (Medical): No   Lack of Transportation (Non-Medical): No  Physical Activity: Inactive   Days of Exercise per Week: 0 days   Minutes of Exercise per Session: 0 min  Stress: Not on file  Social Connections: Moderately Integrated   Frequency of Communication with Friends and Family: Twice a week   Frequency of Social Gatherings with Friends and Family: Once a week   Attends Religious Services: 1 to 4 times per year   Active Member of [employer] or Organizations: Yes   Attends Archivist Meetings: 1 to 4 times per year   Marital Status: Widowed  Human resources officer Violence: Not At Risk   Fear of Current or Ex-Partner: No   Emotionally Abused: No   Physically Abused: No   Sexually Abused: No    Outpatient Medications Prior to Visit  Medication Sig Dispense Refill   acetaminophen (TYLENOL) 500 MG tablet Take 500 mg by mouth every 6 (six) hours as needed for moderate pain or headache.     ELIQUIS 5 MG TABS tablet Take 1 tablet (5 mg total) by mouth 2 (two) times daily. 60 tablet 0   estradiol (ESTRACE) 0.1 MG/GM vaginal cream Place 1 g vaginally 3 (three) times a week. Pea sized amount per urethra 42.5 g 12   LORazepam (ATIVAN) 0.5 MG tablet Take 1 tablet (0.5 mg total) by mouth at bedtime as needed for anxiety. 30 tablet 0   losartan (COZAAR) 25 MG tablet Take 1 tablet (25 mg total) by mouth daily. (Patient taking differently: Take 12.5 mg by mouth at bedtime.) 90 tablet 1    traZODone (DESYREL) 50 MG tablet Take 50 mg by mouth at bedtime.     cetirizine (ZYRTEC) 10 MG tablet Take 10 mg by mouth daily as needed for allergies.     Cholecalciferol (VITAMIN D) 2000 units tablet Take 2,000 Units by mouth daily.     furosemide (LASIX) 20 MG tablet Take 40 mg by mouth daily. (Patient not taking: Reported on 07/06/2021)     Ketotifen Fumarate (ALLERGY EYE DROPS OP) Place 1 drop into both  eyes daily as needed (allergies).     metoprolol tartrate (LOPRESSOR) 25 MG tablet Take by mouth. (Patient not taking: Reported on 04/08/2021)     potassium chloride (KLOR-CON) 10 MEQ tablet Take 10-20 mEq by mouth daily. (Patient not taking: Reported on 04/08/2021)     spironolactone (ALDACTONE) 25 MG tablet Take 1 tablet by mouth daily. (Patient not taking: Reported on 07/06/2021)     clindamycin (CLEOCIN) 300 MG capsule Take 300 mg by mouth 2 (two) times daily. (Patient not taking: Reported on 07/06/2021)     nitrofurantoin, macrocrystal-monohydrate, (MACROBID) 100 MG capsule Take 1 capsule (100 mg total) by mouth 2 (two) times daily. (Patient not taking: Reported on 04/08/2021) 14 capsule 0   No facility-administered medications prior to visit.    Allergies  Allergen Reactions   Barium Iodide     Unknown   Crestor [Rosuvastatin Calcium] Other (See Comments)    Myalgia    Penicillins Rash   Sulfa Antibiotics Rash   Sulfonamide Derivatives Rash    Review of Systems  Constitutional: Negative.   HENT: Negative.    Respiratory: Negative.    Cardiovascular:  Positive for leg swelling. Negative for chest pain and palpitations.  Gastrointestinal: Negative.   Genitourinary: Negative.   Musculoskeletal:  Positive for arthralgias. Negative for back pain.  Skin:  Positive for color change and wound. Negative for pallor and rash.  Neurological: Negative.   Psychiatric/Behavioral: Negative.        Objective:   Blood pressure 128/86, pulse 64, height 5' 3.5" (1.613 m), weight 166 lb  12.8 oz (75.7 kg), SpO2 92 %.  Physical Exam HENT:     Head: Normocephalic and atraumatic.  Cardiovascular:     Pulses:          Dorsalis pedis pulses are 1+ on the right side and 1+ on the left side.       Posterior tibial pulses are 1+ on the right side and 1+ on the left side.  Musculoskeletal:       Feet:  Feet:     Right foot:     Skin integrity: Callus present.     Left foot:     Skin integrity: Skin integrity normal.  Skin:    General: Skin is warm.     Findings: Erythema present.       Neurological:     Mental Status: She is alert.   General: Appearance:     Mildly obese female in no acute distress  Eyes:    PERRL, conjunctiva/corneas clear, EOM's intact       Lungs:     Clear to auscultation bilaterally, respirations unlabored  Heart:    Normal heart rate. Regular rhythm.  No murmurs, rubs, or gallops.   MS:   All extremities are intact.    Neurologic:   Awake, alert, oriented x 3. No apparent focal neurological           defect.     Media Information Document Information  Photos  Right anterior leg   07/06/2021 15:11  Attached To:  Office Visit on 07/06/21 with Lyllie Cobbins, Kelby Aline, FNP   Source Information  Ryleigh Esqueda, Kelby Aline, FNP   [facility]   BP 128/86    Pulse 64 Comment: manual recheck   Ht 5' 3.5" (1.613 m)    Wt 166 lb 12.8 oz (75.7 kg)    SpO2 92%    BMI 29.08 kg/m  Wt Readings from Last 3 Encounters:  07/06/21 166 lb 12.8  oz (75.7 kg)  03/22/21 168 lb 6.4 oz (76.4 kg)  12/29/20 164 lb 12.8 oz (74.8 kg)    Health Maintenance Due  Topic Date Due   Zoster Vaccines- Shingrix (1 of 2) Never done   COVID-19 Vaccine (4 - Booster for Pfizer series) 05/26/2020   HEMOGLOBIN A1C  03/12/2021    There are no preventive care reminders to display for this patient.   Lab Results  Component Value Date   TSH 1.91 05/27/2020   Lab Results  Component Value Date   WBC 5.8 05/27/2020   HGB 15.7 (H) 05/27/2020   HCT 47.1 (H) 05/27/2020    MCV 96.1 05/27/2020   PLT 196.0 05/27/2020   Lab Results  Component Value Date   NA 142 09/09/2020   K 4.1 12/21/2020   CO2 35 (H) 09/09/2020   GLUCOSE 160 (H) 09/09/2020   BUN 22 09/09/2020   CREATININE 0.85 09/09/2020   BILITOT 2.6 (H) 09/09/2020   ALKPHOS 74 09/09/2020   AST 26 09/09/2020   ALT 22 09/09/2020   PROT 6.3 09/09/2020   ALBUMIN 4.0 09/09/2020   CALCIUM 9.5 09/09/2020   ANIONGAP 12 05/10/2018   GFR 62.73 09/09/2020   Lab Results  Component Value Date   CHOL 145 09/09/2020   Lab Results  Component Value Date   HDL 53.70 09/09/2020   Lab Results  Component Value Date   LDLCALC 79 09/09/2020   Lab Results  Component Value Date   TRIG 63.0 09/09/2020   Lab Results  Component Value Date   CHOLHDL 3 09/09/2020   Lab Results  Component Value Date   HGBA1C 7.2 (H) 09/09/2020       Assessment & Plan:   Problem List Items Addressed This Visit       Musculoskeletal and Integument   Callus of foot    questionable callus versus plantar wart she has been picking at area and has no erythema but heard to determine due to patients previous manipulation.       Relevant Orders   Ambulatory referral to Podiatry     Other   Injury of right leg    Reports hit lower right leg with wood and not healing completley, she was on clindamycin by cardiology in November, leg is still tender with mild erythema,and has iron staining, DVT US, probiotic for 3 weeks, clindamycin and labs today, if no improvement in one week from 07/06/21 consider dermatology.       Relevant Orders   CBC with Differential/Platelet   Comprehensive metabolic panel   US Venous Img Lower Unilateral Right (DVT)   DG Tibia/Fibula Right   Right leg pain - Primary   Relevant Orders   CBC with Differential/Platelet   Comprehensive metabolic panel   US Venous Img Lower Unilateral Right (DVT)   DG Tibia/Fibula Right     Meds ordered this encounter  Medications   clindamycin (CLEOCIN) 300  MG capsule    Sig: Take 1 capsule (300 mg total) by mouth 3 (three) times daily for 10 days.    Dispense:  30 capsule    Refill:  0   Follow treatment plan from office  if not improving or any worsening within 72 hours and also return to office or open medical facility at ANYTIME if any symptoms persist, change, or worsen or you have any further concerns or questions. Call 911 immediately for emergencies.   Return in about 1 week (around 07/13/2021), or if symptoms worsen or fail to  improve, for at any time for any worsening symptoms, Go to Emergency room/ urgent care if worse.   Marcille Buffy, FNP

## 2021-07-06 NOTE — Telephone Encounter (Signed)
Michelle: dvt US on right leg  Sharyn Lull: she is in office just in case- going for x ray and labs   Martin Majestic was added  Rasheedah: ok Rasheedah: on hold now  Sharyn Lull: K  Rasheedah: pt can head on over to [place]: please let pt know it will be a little bit of a wait. Thank you  Sharyn Lull added Leane Para: latoya is she still in lab or x ray ?  Elvina MattesSumner Boast please let pt know Thanks   Sharyn Lull: ok Martin Majestic said patient left  Sorry  Rasheedah:  was pt able to get the appt info? Do I need to try and reach pt?  Latoya: I'm not sure of any of this. I dont see these messages when I'm in x-ray. Pt is gone  Rasheedah: Toya I did not add you ok np i'll try and reach pt  Latoya: Reyne Dumas did. But I didn't see this before letting her go  Rasheedah: I was not able to reach pt the number on file is her home number. I called the daughter number it just rang I'm going to cancel pt appt until I hear from pt I left pt vm to call   Sharyn Lull: ok so sorry

## 2021-07-06 NOTE — Patient Instructions (Addendum)
Clindamycin Capsules What is this medication? CLINDAMYCIN ([place] sin) treats infections caused by bacteria. It belongs to a group of medications called antibiotics. It will not treat colds, the flu, or infections caused by viruses. This medicine may be used for other purposes; ask your health care provider or pharmacist if you have questions. COMMON BRAND NAME(S): Cleocin What should I tell my care team before I take this medication? They need to know if you have any of these conditions: Kidney disease Liver disease Stomach problems like colitis An unusual or allergic reaction to clindamycin, lincomycin, or other medications, foods, dyes like tartrazine or preservatives Pregnant or trying to get pregnant Breast-feeding How should I use this medication? Take this medication by mouth with a full glass of water. Follow the directions on the prescription label. You can take this medication with food or on an empty stomach. If the medication upsets your stomach, take it with food. Take your medication at regular intervals. Do not take your medication more often than directed. Take all of your medication as directed even if you think you are better. Do not skip doses or stop your medication early. Talk to your care team about the use of this medication in children. Special care may be needed. Overdosage: If you think you have taken too much of this medicine contact a poison control center or emergency room at once. NOTE: This medicine is only for you. Do not share this medicine with others. What if I miss a dose? If you miss a dose, take it as soon as you can. If it is almost time for your next dose, take only that dose. Do not take double or extra doses. What may interact with this medication? Birth control pills Medications that relax muscles for surgery Rifampin This list may not describe all possible interactions. Give your health care provider a list of all the medicines, herbs,  non-prescription drugs, or dietary supplements you use. Also tell them if you smoke, drink alcohol, or use illegal drugs. Some items may interact with your medicine. What should I watch for while using this medication? Tell your care team if your symptoms do not start to get better or if they get worse. This medication may cause serious skin reactions. They can happen weeks to months after starting the medication. Contact your care team right away if you notice fevers or flu-like symptoms with a rash. The rash may be red or purple and then turn into blisters or peeling of the skin. Or, you might notice a red rash with swelling of the face, lips or lymph nodes in your neck or under your arms. Do not treat diarrhea with over the counter products. Contact your care team if you have diarrhea that lasts more than 2 days or if it is severe and watery. What side effects may I notice from receiving this medication? Side effects that you should report to your care team as soon as possible: Allergic reactions--skin rash, itching, hives, swelling of the face, lips, tongue, or throat Kidney injury--decrease in the amount of urine, swelling of the ankles, hands, or feet Rash, fever, and swollen lymph nodes Redness, blistering, peeling, or loosening of the skin, including inside the mouth Severe diarrhea, fever Unusual vaginal discharge, itching, or odor Side effects that usually do not require medical attention (report to your care team if they continue or are bothersome): Diarrhea Metallic taste in mouth Nausea Stomach pain Vomiting This list may not describe all possible side effects.  Call your doctor for medical advice about side effects. You may report side effects to FDA at 1-800-FDA-1088. Where should I keep my medication? Keep out of the reach of children. Store at room temperature between 20 and 25 degrees C (68 and 77 degrees F). Throw away any unused medication after the expiration date. NOTE:  This sheet is a summary. It may not cover all possible information. If you have questions about this medicine, talk to your doctor, pharmacist, or health care provider.  2022 Elsevier/Gold Standard (2020-08-13 00:00:00) Cellulitis, Adult Cellulitis is a skin infection. The infected area is usually warm, red, swollen, and tender. This condition occurs most often in the arms and lower legs. The infection can travel to the muscles, blood, and underlying tissue and become serious. It is very important to get treated for this condition. What are the causes? Cellulitis is caused by bacteria. The bacteria enter through a break in the skin, such as a cut, burn, insect bite, open sore, or crack. What increases the risk? This condition is more likely to occur in people who: Have a weak body defense system (immune system). Have open wounds on the skin, such as cuts, burns, bites, and scrapes. Bacteria can enter the body through these open wounds. Are older than 86 years of age. Have diabetes. Have a type of long-lasting (chronic) liver disease (cirrhosis) or kidney disease. Are obese. Have a skin condition such as: Itchy rash (eczema). Slow movement of blood in the veins (venous stasis). Fluid buildup below the skin (edema). Have had radiation therapy. Use IV drugs. What are the signs or symptoms? Symptoms of this condition include: Redness, streaking, or spotting on the skin. Swollen area of the skin. Tenderness or pain when an area of the skin is touched. Warm skin. A fever. Chills. Blisters. How is this diagnosed? This condition is diagnosed based on a medical history and physical exam. You may also have tests, including: Blood tests. Imaging tests. How is this treated? Treatment for this condition may include: Medicines, such as antibiotic medicines or medicines to treat allergies (antihistamines). Supportive care, such as rest and application of cold or warm cloths (compresses) to the  skin. Hospital care, if the condition is severe. The infection usually starts to get better within 1-2 days of treatment. Follow these instructions at home: Medicines Take over-the-counter and prescription medicines only as told by your health care provider. If you were prescribed an antibiotic medicine, take it as told by your health care provider. Do not stop taking the antibiotic even if you start to feel better. General instructions Drink enough fluid to keep your urine pale yellow. Do not touch or rub the infected area. Raise (elevate) the infected area above the level of your heart while you are sitting or lying down. Apply warm or cold compresses to the affected area as told by your health care provider. Keep all follow-up visits as told by your health care provider. This is important. These visits let your health care provider make sure a more serious infection is not developing. Contact a health care provider if: You have a fever. Your symptoms do not begin to improve within 1-2 days of starting treatment. Your bone or joint underneath the infected area becomes painful after the skin has healed. Your infection returns in the same area or another area. You notice a swollen bump in the infected area. You develop new symptoms. You have a general ill feeling (malaise) with muscle aches and pains. Get help right  away if: Your symptoms get worse. You feel very sleepy. You develop vomiting or diarrhea that persists. You notice red streaks coming from the infected area. Your red area gets larger or turns dark in color. These symptoms may represent a serious problem that is an emergency. Do not wait to see if the symptoms will go away. Get medical help right away. Call your local emergency services (911 in the [place] Do not drive yourself to the hospital. Summary Cellulitis is a skin infection. This condition occurs most often in the arms and lower legs. Treatment for this condition may  include medicines, such as antibiotic medicines or antihistamines. Take over-the-counter and prescription medicines only as told by your health care provider. If you were prescribed an antibiotic medicine, do not stop taking the antibiotic even if you start to feel better. Contact a health care provider if your symptoms do not begin to improve within 1-2 days of starting treatment or your symptoms get worse. Keep all follow-up visits as told by your health care provider. This is important. These visits let your health care provider make sure that a more serious infection is not developing. This information is not intended to replace advice given to you by your health care provider. Make sure you discuss any questions you have with your health care provider. Document Revised: 06/16/2019 Document Reviewed: 10/25/2017 Elsevier Patient Education  [place].

## 2021-07-07 LAB — COMPREHENSIVE METABOLIC PANEL
ALT: 20 U/L (ref 0–35)
AST: 22 U/L (ref 0–37)
Albumin: 4 g/dL (ref 3.5–5.2)
Alkaline Phosphatase: 84 U/L (ref 39–117)
BUN: 24 mg/dL — ABNORMAL HIGH (ref 6–23)
CO2: 32 mEq/L (ref 19–32)
Calcium: 9.5 mg/dL (ref 8.4–10.5)
Chloride: 99 mEq/L (ref 96–112)
Creatinine, Ser: 0.84 mg/dL (ref 0.40–1.20)
GFR: 63.26 mL/min (ref >60.00)
Glucose, Bld: 105 mg/dL — ABNORMAL HIGH (ref 70–99)
Potassium: 3.6 mEq/L (ref 3.5–5.1)
Sodium: 140 mEq/L (ref 135–145)
Total Bilirubin: 1.4 mg/dL — ABNORMAL HIGH (ref 0.2–1.2)
Total Protein: 6.4 g/dL (ref 6.0–8.3)

## 2021-07-07 LAB — CBC WITH DIFFERENTIAL/PLATELET
Basophils Absolute: 0.1 10*3/uL (ref 0.0–0.1)
Basophils Relative: 1.1 % (ref 0.0–3.0)
Eosinophils Absolute: 0.1 10*3/uL (ref 0.0–0.7)
Eosinophils Relative: 2.3 % (ref 0.0–5.0)
HCT: 44.7 % (ref 36.0–46.0)
Hemoglobin: 14.9 g/dL (ref 12.0–15.0)
Lymphocytes Relative: 27 % (ref 12.0–46.0)
Lymphs Abs: 1.5 10*3/uL (ref 0.7–4.0)
MCHC: 33.4 g/dL (ref 30.0–36.0)
MCV: 96.8 fl (ref 78.0–100.0)
Monocytes Absolute: 0.7 10*3/uL (ref 0.1–1.0)
Monocytes Relative: 11.9 % (ref 3.0–12.0)
Neutro Abs: 3.2 10*3/uL (ref 1.4–7.7)
Neutrophils Relative %: 57.7 % (ref 43.0–77.0)
Platelets: 164 10*3/uL (ref 150.0–400.0)
RBC: 4.61 Mil/uL (ref 3.87–5.11)
RDW: 13.9 % (ref 11.5–15.5)
WBC: 5.5 10*3/uL (ref 4.0–10.5)

## 2021-07-07 NOTE — Progress Notes (Signed)
Non fasting labs, glucose mild elevation, total bilirubin improved but still elevated recommend gastroenterology  work up if not seeing ?  CBC is within normal limits.  She still needs to go for STAT US of leg that was discussed in office to rule out clot.  Recheck leg in one week. If all negative and no improvement consider dermatology referral.

## 2021-07-07 NOTE — Progress Notes (Signed)
Soft tissue edema, DVT ultrasound was ordered as well at office visit.  Keep follow up as well in 1 week may need dermatology if not healed.

## 2021-07-11 ENCOUNTER — Ambulatory Visit
Admission: RE | Admit: 2021-07-11 | Discharge: 2021-07-11 | Disposition: A | Discharge status: Home / Self Care | Payer: Medicare Other | Source: Ambulatory Visit | Attending: Adult Health | Admitting: Adult Health

## 2021-07-11 ENCOUNTER — Telehealth: Payer: Self-pay

## 2021-07-11 ENCOUNTER — Other Ambulatory Visit: Payer: Self-pay

## 2021-07-11 DIAGNOSIS — M79604 Pain in right leg: Secondary | ICD-10-CM | POA: Diagnosis present

## 2021-07-11 DIAGNOSIS — S8991XA Unspecified injury of right lower leg, initial encounter: Secondary | ICD-10-CM | POA: Insufficient documentation

## 2021-07-11 NOTE — Telephone Encounter (Signed)
Noted. Result note is in to call patient.   Thank you,  Laverna Peace MSN, AGNP-C, FNP-C  Family Nurse Practitioner  Adult Geriatric Nurse Practitioner

## 2021-07-11 NOTE — Progress Notes (Signed)
Suspected Bakers cyst is seen on ultrasound, this is a collection of fluid swelling behind the right knee. Need to see orthopedics for this, let me know if she has one or if she has a preference.   At area of lower leg injury right leg, the patient likely has fluid at site of injury, keep follow up.continue antibiotics as given, ice and resting extremity.    Also given her history of skin cancer if area persist,  would advise she have a follow up for skin check as well at her dermatologist.

## 2021-07-12 ENCOUNTER — Encounter: Payer: Self-pay | Admitting: Podiatry

## 2021-07-12 ENCOUNTER — Telehealth: Payer: Self-pay | Admitting: *Deleted

## 2021-07-12 ENCOUNTER — Ambulatory Visit (INDEPENDENT_AMBULATORY_CARE_PROVIDER_SITE_OTHER): Payer: Medicare Other | Admitting: Podiatry

## 2021-07-12 DIAGNOSIS — L989 Disorder of the skin and subcutaneous tissue, unspecified: Secondary | ICD-10-CM | POA: Diagnosis not present

## 2021-07-12 DIAGNOSIS — M79675 Pain in left toe(s): Secondary | ICD-10-CM | POA: Diagnosis not present

## 2021-07-12 DIAGNOSIS — M79674 Pain in right toe(s): Secondary | ICD-10-CM

## 2021-07-12 DIAGNOSIS — B351 Tinea unguium: Secondary | ICD-10-CM

## 2021-07-12 NOTE — Telephone Encounter (Signed)
-----   Message from Doreen Beam, [place] sent at 07/11/2021  2:48 PM EST ----- Suspected Bakers cyst is seen on ultrasound, this is a collection of fluid swelling behind the right knee. Need to see orthopedics for this, let me know if she has one or if she has a preference.   At area of lower leg injury right leg, the patient likely has fluid at site of injury, keep follow up.continue antibiotics as given, ice and resting extremity.    Also given her history of skin cancer if area persist,  would advise she have a follow up for skin check as well at her dermatologist.

## 2021-07-12 NOTE — Progress Notes (Signed)
° °  SUBJECTIVE Patient presents to office today complaining of elongated, thickened nails that cause pain while ambulating in shoes.  Patient is unable to trim their own nails.  Patient also states that for several years she has had a symptomatic callus to the plantar aspect of the right foot.  She is trimmed it out in the past and also applied corn and callus pads with minimal improvement.  Patient is here for further evaluation and treatment.  Past Medical History:  Diagnosis Date   Anxiety    Arrhythmia    paroxysmal atrial fibrillation   CHF (congestive heart failure) (HCC)    Colon polyp    Diverticulitis    Dysrhythmia    GERD (gastroesophageal reflux disease)    Hard of hearing    Hoarseness    Hyperlipidemia    Hypertension    Kidney stone    Motion sickness    Parathyroid disease ([place])    Parathyroidectomy    PONV (postoperative nausea and vomiting)    Pre-diabetes    PVC (premature ventricular contraction)    Skin cancer    Stroke ([place])    TIA (transient ischemic attack) 04/2018   no deficitis   UTI (lower urinary tract infection)    Varicose veins of both lower extremities    Wears hearing aid in both ears     OBJECTIVE General Patient is awake, alert, and oriented x 3 and in no acute distress. Derm Skin is dry and supple bilateral. Negative open lesions or macerations. Remaining integument unremarkable. Nails are tender, long, thickened and dystrophic with subungual debris, consistent with onychomycosis, 1-5 bilateral. No signs of infection noted. There is a hyperkeratotic porokeratosis/benign skin lesion to the plantar aspect of the fifth MTP joint right foot with associated tenderness to palpation Vasc  DP and PT pedal pulses palpable bilaterally. Temperature gradient within normal limits.  Neuro Epicritic and protective threshold sensation grossly intact bilaterally.  Musculoskeletal Exam No symptomatic pedal deformities noted bilateral. Muscular strength within  normal limits.  ASSESSMENT 1.  Pain due to onychomycosis of toenails both 2.  Symptomatic porokeratosis/benign skin lesion plantar fifth MTP joint right  PLAN OF CARE 1. Patient evaluated today.  2. Instructed to maintain good pedal hygiene and foot care.  3. Mechanical debridement of nails 1-5 bilaterally performed using a nail nipper. Filed with dremel without incident.  4.  Excisional debridement of the hyperkeratotic porokeratosis was performed to the plantar aspect of the fifth MTP right foot using a 312 scalpel without incident or bleeding.  Patient felt immediate relief  5.  Return to clinic in 3 mos.   *Lives 6 min away at [facility]. Has a daughter here in [place] and a Son that lives in [place].    Edrick Kins, DPM [facility]  Dr. Edrick Kins, DPM    [address], [place] [address]                Office [phone]  Fax [phone]

## 2021-07-12 NOTE — Telephone Encounter (Signed)
Please transfer return call to nurse.

## 2021-07-13 ENCOUNTER — Encounter: Payer: Self-pay | Admitting: Adult Health

## 2021-07-13 ENCOUNTER — Ambulatory Visit (INDEPENDENT_AMBULATORY_CARE_PROVIDER_SITE_OTHER): Payer: Medicare Other | Admitting: Adult Health

## 2021-07-13 ENCOUNTER — Ambulatory Visit (INDEPENDENT_AMBULATORY_CARE_PROVIDER_SITE_OTHER): Payer: Medicare Other

## 2021-07-13 ENCOUNTER — Other Ambulatory Visit: Payer: Self-pay

## 2021-07-13 VITALS — BP 140/80 | HR 61 | Temp 97.5°F | Ht 63.5 in | Wt 166.8 lb

## 2021-07-13 DIAGNOSIS — R600 Localized edema: Secondary | ICD-10-CM

## 2021-07-13 DIAGNOSIS — I8393 Asymptomatic varicose veins of bilateral lower extremities: Secondary | ICD-10-CM | POA: Insufficient documentation

## 2021-07-13 DIAGNOSIS — L989 Disorder of the skin and subcutaneous tissue, unspecified: Secondary | ICD-10-CM | POA: Diagnosis not present

## 2021-07-13 DIAGNOSIS — C44712 Basal cell carcinoma of skin of right lower limb, including hip: Secondary | ICD-10-CM

## 2021-07-13 DIAGNOSIS — I10 Essential (primary) hypertension: Secondary | ICD-10-CM

## 2021-07-13 DIAGNOSIS — M79604 Pain in right leg: Secondary | ICD-10-CM

## 2021-07-13 LAB — BRAIN NATRIURETIC PEPTIDE: Pro B Natriuretic peptide (BNP): 418 pg/mL — ABNORMAL HIGH (ref 0.0–100.0)

## 2021-07-13 NOTE — Assessment & Plan Note (Signed)
Noted 4 years ago seen dermatology she is unsure who she seen, needs referral for new lesion. Placed  Referral.

## 2021-07-13 NOTE — Progress Notes (Addendum)
BNP is mildly elevated, she should go ahead and take her Lasix as directed at office- she has PRN dose.  Blood pressure was elevated in office. and keep cardiology appointment tomorrow with Dr. Corky Sox 07/14/21

## 2021-07-13 NOTE — Assessment & Plan Note (Signed)
Bilateral ankle edema.   prior bilateral great saphenous vein stripping noted on vascular evaluation [facility] Oct 2015 Bilateral incompetent varicosities noted

## 2021-07-13 NOTE — Patient Instructions (Addendum)
Will have you take lasix 20 mg once today and see cardiology 07/14/21.  Feel could be stress related.   BNP and chest x ray today.   Hypertension, Adult Hypertension is another name for high blood pressure. High blood pressure forces your heart to work harder to pump blood. This can cause problems over time. There are two numbers in a blood pressure reading. There is a top number (systolic) over a bottom number (diastolic). It is best to have a blood pressure that is below 120/80. Healthy choices can help lower your blood pressure, or you may need medicine to help lower it. What are the causes? The cause of this condition is not known. Some conditions may be related to high blood pressure. What increases the risk? Smoking. Having type 2 diabetes mellitus, high cholesterol, or both. Not getting enough exercise or physical activity. Being overweight. Having too much fat, sugar, calories, or salt (sodium) in your diet. Drinking too much alcohol. Having long-term (chronic) kidney disease. Having a family history of high blood pressure. Age. Risk increases with age. Race. You may be at higher risk if you are African American. Gender. Men are at higher risk than women before age 65. After age 52, women are at higher risk than men. Having obstructive sleep apnea. Stress. What are the signs or symptoms? High blood pressure may not cause symptoms. Very high blood pressure (hypertensive crisis) may cause: Headache. Feelings of worry or nervousness (anxiety). Shortness of breath. Nosebleed. A feeling of being sick to your stomach (nausea). Throwing up (vomiting). Changes in how you see. Very bad chest pain. Seizures. How is this treated? This condition is treated by making healthy lifestyle changes, such as: Eating healthy foods. Exercising more. Drinking less alcohol. Your health care provider may prescribe medicine if lifestyle changes are not enough to get your blood pressure under  control, and if: Your top number is above 130. Your bottom number is above 80. Your personal target blood pressure may vary. Follow these instructions at home: Eating and drinking  If told, follow the DASH eating plan. To follow this plan: Fill one half of your plate at each meal with fruits and vegetables. Fill one fourth of your plate at each meal with whole grains. Whole grains include whole-wheat pasta, brown rice, and whole-grain bread. Eat or drink low-fat dairy products, such as skim milk or low-fat yogurt. Fill one fourth of your plate at each meal with low-fat (lean) proteins. Low-fat proteins include fish, chicken without skin, eggs, beans, and tofu. Avoid fatty meat, cured and processed meat, or chicken with skin. Avoid pre-made or processed food. Eat less than 1,500 mg of salt each day. Do not drink alcohol if: Your doctor tells you not to drink. You are pregnant, may be pregnant, or are planning to become pregnant. If you drink alcohol: Limit how much you use to: 0-1 drink a day for women. 0-2 drinks a day for men. Be aware of how much alcohol is in your drink. In the [place] one drink equals one 12 oz bottle of beer (355 mL), one 5 oz glass of wine (148 mL), or one 1 oz glass of hard liquor (44 mL). Lifestyle  Work with your doctor to stay at a healthy weight or to lose weight. Ask your doctor what the best weight is for you. Get at least 30 minutes of exercise most days of the week. This may include walking, swimming, or biking. Get at least 30 minutes of exercise that strengthens  your muscles (resistance exercise) at least 3 days a week. This may include lifting weights or doing Pilates. Do not use any products that contain nicotine or tobacco, such as cigarettes, e-cigarettes, and chewing tobacco. If you need help quitting, ask your doctor. Check your blood pressure at home as told by your doctor. Keep all follow-up visits as told by your doctor. This is  important. Medicines Take over-the-counter and prescription medicines only as told by your doctor. Follow directions carefully. Do not skip doses of blood pressure medicine. The medicine does not work as well if you skip doses. Skipping doses also puts you at risk for problems. Ask your doctor about side effects or reactions to medicines that you should watch for. Contact a doctor if you: Think you are having a reaction to the medicine you are taking. Have headaches that keep coming back (recurring). Feel dizzy. Have swelling in your ankles. Have trouble with your vision. Get help right away if you: Get a very bad headache. Start to feel mixed up (confused). Feel weak or numb. Feel faint. Have very bad pain in your: Chest. Belly (abdomen). Throw up more than once. Have trouble breathing. Summary Hypertension is another name for high blood pressure. High blood pressure forces your heart to work harder to pump blood. For most people, a normal blood pressure is less than 120/80. Making healthy choices can help lower blood pressure. If your blood pressure does not get lower with healthy choices, you may need to take medicine. This information is not intended to replace advice given to you by your health care provider. Make sure you discuss any questions you have with your health care provider. Document Revised: 02/13/2018 Document Reviewed: 02/13/2018 Elsevier Patient Education  [place].

## 2021-07-13 NOTE — Assessment & Plan Note (Signed)
Chronic. 

## 2021-07-13 NOTE — Assessment & Plan Note (Signed)
Right lower anterior leg lateral , skin lesion, patient reports has been present 3-4 weeks, given her history of skin cancer warrants seeing dermatology for evaluation. Referral placed today 07/13/21. Call if not heard in 2 weeks.

## 2021-07-13 NOTE — Assessment & Plan Note (Signed)
Pain and erythema has resolved, she took 5 days of clindamycin, stopped for nausea. No diarrhea.

## 2021-07-13 NOTE — Assessment & Plan Note (Addendum)
07/13/21 she reports blood pressure elevated last evening and again today. She has increased stress due to moving to [place], change in diet, and stress with children putting her house on the market this week.  Denies any worsening shortness of breath, chest pain or headache.  Does have some bilateral ankle edema.  She will keep log of blood  Pressures call if remains elevated.  She reports weight at home is 163 lbs, noted to be  166 lbs here last two visits.  Blood pressure has been normal until this visit.  Chest x ray and BNP today.  Will have her take Lasix 20 mg once today, she takes only PRN.  Will have her continue current antihypertensive. She is not on metoprolol it is on her medication list however she reports she has not been on it for years as she could not tolerate. Marked as not taking.  She has a follow up with her cardiologist tomorrow 07/14/21.. follow up scheduled with PCP on 07/21/21 return if any symptoms worsening.

## 2021-07-13 NOTE — Progress Notes (Addendum)
Acute Office Visit  Subjective:    Patient ID: Linda [name], female    DOB: 1933/08/04, 88 y.o.   MRN: [id number]  Chief Complaint  Patient presents with   Follow-up    1 week/ BP elevated    HPI Patient is in today for follow up on right leg, pain is gone, erythema is gone, she still has an area of concern to provider right anterior lower leg.  Clindamycin she took for 5 days and stopped it.She reports she stopped medication due to nausea and that symptoms resolved. She denied any other symptoms with nausea, no shortness of breath, problems swallowing or throat swelling.   She still has a scaly area right anterior lower leg, last week she pointed to this area as where she hit a piece of wood, this week she reports she hit the center of her right lower leg. Of note she does have a history of basal cell carcinoma of right lower leg, she reports this area was on her left side of her right leg.   Erythema has resolved on right lower leg. She still has chronic iron staining in right lower leg.    DVT ultrasound negative right lower extremity reviewed, also x ray of right lower leg showed soft tissue edema and varicosities.  FINDINGS: The proximal aspect of the tibia is excluded from the field of view on both frontal and lateral views. No evidence of fracture. The cortical margins of the tibia and fibula are intact were visualized. Knee and ankle alignment are grossly maintained. No erosion or periosteal reaction. Insert patchiness soft tissue densities in the medial subcutaneous tissues may be related to varicosities, soft tissue edema is also possible.   IMPRESSION: 1. No fracture of the right lower leg. 2. Medial soft tissue densities may be related to varicosities, soft tissue edema, or combination thereof.     Electronically Signed   By: Linda [name] M.D.   On: 07/07/2021 11:56      She does feel more stressed today she is getting ready to sell her house and she has  her mind on this- her children are listing it this week. Blood pressure is elevated today and was elevated yesterday morning and also last night  150's /90's denies any missed doses of blood pressure medication. She is likely getting more salt at the village of [place] she reports but she is trying to take in more vegetables. Weight is the same today.  She is taking 25 mg losartan at night. Metoprolol she reports has not been taking since June. Cardiology note has her off the metoprolol as well.She has lasix 20 mg PRN not taking, no significant weight gain she says she is 163 lbs at facility. 166 here in office today.  Atrial fibrillation on Eliquis. Denies any palpitations.   Has cardiology appointment 07/14/21 tomorrow.  Since last visit she see podiatry Linda [name] for nail care and porokeratosis of right plantar 5th MTP joint. Has follow up for 3 months.   Patient  denies any fever, body aches,chills, rash, chest pain, denies any new shortness of breath other than her baseline " for years" , denies any nausea, vomiting, or diarrhea.  Denies dizziness, lightheadedness, pre syncopal or syncopal episodes.      Past Medical History:  Diagnosis Date   Anxiety    Arrhythmia    paroxysmal atrial fibrillation   CHF (congestive heart failure) (HCC)    Colon polyp    Diverticulitis  Dysrhythmia    GERD (gastroesophageal reflux disease)    Hard of hearing    Hoarseness    Hyperlipidemia    Hypertension    Kidney stone    Motion sickness    Parathyroid disease (HCC)    Parathyroidectomy    PONV (postoperative nausea and vomiting)    Pre-diabetes    PVC (premature ventricular contraction)    Skin cancer    Stroke ([place])    TIA (transient ischemic attack) 04/2018   no deficitis   UTI (lower urinary tract infection)    Varicose veins of both lower extremities    Wears hearing aid in both ears     Past Surgical History:  Procedure Laterality Date   ABDOMINAL HYSTERECTOMY      APPENDECTOMY     CATARACT EXTRACTION W/PHACO Left 03/05/2019   Procedure: CATARACT EXTRACTION PHACO AND INTRAOCULAR LENS PLACEMENT ([place])   0:57 15.6% 9.05;  Surgeon: Leandrew Koyanagi, MD;  Location: [facility];  Service: Ophthalmology;  Laterality: Left;  Diabetic - diet cotrolled   CATARACT EXTRACTION W/PHACO Right 04/09/2019   Procedure: CATARACT EXTRACTION PHACO AND INTRAOCULAR LENS PLACEMENT ([facility]) RIGHT DIABETIC 00:47.6  15.9%  7.60;  Surgeon: Leandrew Koyanagi, MD;  Location: [facility];  Service: Ophthalmology;  Laterality: Right;   CHOLECYSTECTOMY  2002   ESOPHAGOGASTRODUODENOSCOPY (EGD) WITH PROPOFOL N/A 12/17/2019   Procedure: ESOPHAGOGASTRODUODENOSCOPY (EGD) WITH PROPOFOL;  Surgeon: Robert Bellow, MD;  Location: [facility];  Service: Endoscopy;  Laterality: N/A;   LITHOTRIPSY     MITRAL VALVE REPAIR  12/14/2020   PARATHYROIDECTOMY  2004   1 removed   ROTATOR CUFF REPAIR  2002   TEE WITHOUT CARDIOVERSION N/A 09/29/2020   Procedure: TRANSESOPHAGEAL ECHOCARDIOGRAM (TEE);  Surgeon: Teodoro Spray, MD;  Location: [facility];  Service: Cardiovascular;  Laterality: N/A;   TONSILLECTOMY  1956   TONSILLECTOMY     TOTAL ABDOMINAL HYSTERECTOMY W/ BILATERAL SALPINGOOPHORECTOMY  1984   VEIN LIGATION AND STRIPPING      Family History  Problem Relation Age of Onset   Heart disease Brother    Stroke Mother    Skin cancer Father    Breast cancer Paternal Grandmother     Social History   Socioeconomic History   Marital status: Widowed    Spouse name: Linda [name]   Number of children: 2   Years of education: 12   Highest education level: Not on file  Occupational History   Occupation: Product/process development scientist: RETIRED    Comment: Retired  Tobacco Use   Smoking status: Never   Smokeless tobacco: Never  Vaping Use   Vaping Use: Never used  Substance and Sexual Activity   Alcohol use: No   Drug use: No   Sexual activity: Not Currently    Birth  control/protection: None  Other Topics Concern   Not on file  Social History Narrative   Linda [name] is from [facility] [place] and [place]. Recently widowed. She was married to Linda [name], her husband for 59 years. They have a daughter and a son. She enjoys gardening.   Social Determinants of Health   Financial Resource Strain: Low Risk    Difficulty of Paying Living Expenses: Not very hard  Food Insecurity: No Food Insecurity   Worried About Charity fundraiser in the Last Year: Never true   Ran Out of Food in the Last Year: Never true  Transportation Needs: No Transportation Needs   Lack of Transportation (Medical): No  Lack of Transportation (Non-Medical): No  Physical Activity: Inactive   Days of Exercise per Week: 0 days   Minutes of Exercise per Session: 0 min  Stress: Not on file  Social Connections: Moderately Integrated   Frequency of Communication with Friends and Family: Twice a week   Frequency of Social Gatherings with Friends and Family: Once a week   Attends Religious Services: 1 to 4 times per year   Active Member of [employer] or Organizations: Yes   Attends Archivist Meetings: 1 to 4 times per year   Marital Status: Widowed  Human resources officer Violence: Not At Risk   Fear of Current or Ex-Partner: No   Emotionally Abused: No   Physically Abused: No   Sexually Abused: No    Outpatient Medications Prior to Visit  Medication Sig Dispense Refill   acetaminophen (TYLENOL) 500 MG tablet Take 500 mg by mouth every 6 (six) hours as needed for moderate pain or headache.     cetirizine (ZYRTEC) 10 MG tablet Take 10 mg by mouth daily as needed for allergies.     Cholecalciferol (VITAMIN D) 2000 units tablet Take 2,000 Units by mouth daily.     clindamycin (CLEOCIN) 300 MG capsule Take 1 capsule (300 mg total) by mouth 3 (three) times daily for 10 days. 30 capsule 0   ELIQUIS 5 MG TABS tablet Take 1 tablet (5 mg total) by mouth 2 (two) times daily. 60 tablet 0    estradiol (ESTRACE) 0.1 MG/GM vaginal cream Place 1 g vaginally 3 (three) times a week. Pea sized amount per urethra 42.5 g 12   furosemide (LASIX) 20 MG tablet Take 40 mg by mouth daily.     Ketotifen Fumarate (ALLERGY EYE DROPS OP) Place 1 drop into both eyes daily as needed (allergies).     LORazepam (ATIVAN) 0.5 MG tablet Take 1 tablet (0.5 mg total) by mouth at bedtime as needed for anxiety. 30 tablet 0   losartan (COZAAR) 25 MG tablet Take 1 tablet (25 mg total) by mouth daily. (Patient taking differently: Take 12.5 mg by mouth at bedtime.) 90 tablet 1   losartan (COZAAR) 25 MG tablet Take 1 tablet by mouth daily.     metoprolol tartrate (LOPRESSOR) 25 MG tablet Take by mouth.     potassium chloride (KLOR-CON) 10 MEQ tablet Take 10-20 mEq by mouth daily.     spironolactone (ALDACTONE) 25 MG tablet Take 1 tablet by mouth daily.     traZODone (DESYREL) 50 MG tablet Take 50 mg by mouth at bedtime.     No facility-administered medications prior to visit.    Allergies  Allergen Reactions   Barium Iodide     Unknown   Crestor [Rosuvastatin Calcium] Other (See Comments)    Myalgia    Penicillins Rash   Sulfa Antibiotics Rash   Sulfonamide Derivatives Rash    Review of Systems  Constitutional: Negative.   HENT: Negative.    Respiratory: Negative.    Cardiovascular:  Positive for leg swelling. Negative for chest pain and palpitations.  Gastrointestinal: Negative.   Musculoskeletal:  Positive for arthralgias. Negative for back pain, gait problem and joint swelling.  Skin:  Positive for color change.  Psychiatric/Behavioral:  Negative for agitation, behavioral problems, confusion, decreased concentration, dysphoric mood, hallucinations, self-injury, sleep disturbance and suicidal ideas. The patient is nervous/anxious. The patient is not hyperactive.       Objective:    Physical Exam Constitutional:      General: She is not  in acute distress.    Appearance: She is not  ill-appearing or diaphoretic.  HENT:     Head: Normocephalic and atraumatic.     Right Ear: External ear normal.     Left Ear: External ear normal.     Nose: Nose normal.     Mouth/Throat:     Mouth: Mucous membranes are moist.  Eyes:     Pupils: Pupils are equal, round, and reactive to light.  Cardiovascular:     Rate and Rhythm: Normal rate and regular rhythm.     Pulses: Normal pulses.     Heart sounds: Normal heart sounds.  Pulmonary:     Effort: Pulmonary effort is normal. No respiratory distress.     Breath sounds: Normal breath sounds. No stridor. No wheezing, rhonchi or rales.  Chest:     Chest wall: No tenderness.  Abdominal:     General: There is no distension.     Palpations: Abdomen is soft.     Tenderness: There is no abdominal tenderness.  Musculoskeletal:     Right lower leg: Edema (trace ankle) present.     Left lower leg: Edema (trace ankle) present.  Skin:    General: Skin is warm and dry.     Findings: Signs of injury, lesion and rash present. Rash is nodular and scaling.       Neurological:     Mental Status: She is alert. Mental status is at baseline.     Gait: Gait normal.  Psychiatric:        Mood and Affect: Mood normal.        Behavior: Behavior normal.        Thought Content: Thought content normal.    BP 140/80 (BP Location: Left Arm, Cuff Size: Normal)    Pulse 61    Temp (!) 97.5 F (36.4 C) (Oral)    Ht 5' 3.5" (1.613 m)    Wt 166 lb 12.8 oz (75.7 kg)    SpO2 94%    BMI 29.08 kg/m  Wt Readings from Last 3 Encounters:  07/13/21 166 lb 12.8 oz (75.7 kg)  07/06/21 166 lb 12.8 oz (75.7 kg)  03/22/21 168 lb 6.4 oz (76.4 kg)   Vitals with BMI 07/13/2021 07/13/2021 07/06/2021  Height - 5' 3.5" 5' 3.504"  Weight - 166 lbs 13 oz 166 lbs 13 oz  BMI - 65.03 54.65  Systolic 681 275 170  Diastolic 80 90 86  Pulse - 61 64    Health Maintenance Due  Topic Date Due   Zoster Vaccines- Shingrix (1 of 2) Never done   COVID-19 Vaccine (4 - Booster for  Pfizer series) 05/26/2020   HEMOGLOBIN A1C  03/12/2021    Media Information Document Information  Photos  Right lower leg   07/13/2021 10:05  Attached To:  Office Visit on 07/13/21 with Zaria Taha, Kelby Aline, FNP   Source Information  Gagandeep Kossman, Kelby Aline, FNP   [facility]    Media Information Document Information  Photos  Right lower leg   07/13/2021 10:06  Attached To:  Office Visit on 07/13/21 with Kita Neace, Kelby Aline, FNP   Source Information  Aseem Sessums, Kelby Aline, FNP   [facility]    There are no preventive care reminders to display for this patient.   Lab Results  Component Value Date   TSH 1.91 05/27/2020   Lab Results  Component Value Date   WBC 5.5 07/06/2021   HGB 14.9 07/06/2021   HCT 44.7 07/06/2021  MCV 96.8 07/06/2021   PLT 164.0 07/06/2021   Lab Results  Component Value Date   NA 140 07/06/2021   K 3.6 07/06/2021   CO2 32 07/06/2021   GLUCOSE 105 (H) 07/06/2021   BUN 24 (H) 07/06/2021   CREATININE 0.84 07/06/2021   BILITOT 1.4 (H) 07/06/2021   ALKPHOS 84 07/06/2021   AST 22 07/06/2021   ALT 20 07/06/2021   PROT 6.4 07/06/2021   ALBUMIN 4.0 07/06/2021   CALCIUM 9.5 07/06/2021   ANIONGAP 12 05/10/2018   GFR 63.26 07/06/2021   Lab Results  Component Value Date   CHOL 145 09/09/2020   Lab Results  Component Value Date   HDL 53.70 09/09/2020   Lab Results  Component Value Date   LDLCALC 79 09/09/2020   Lab Results  Component Value Date   TRIG 63.0 09/09/2020   Lab Results  Component Value Date   CHOLHDL 3 09/09/2020   Lab Results  Component Value Date   HGBA1C 7.2 (H) 09/09/2020       Assessment & Plan:   Problem List Items Addressed This Visit       Cardiovascular and Mediastinum   Essential hypertension    07/13/21 she reports blood pressure elevated last evening and again today. She has increased stress due to moving to [place], change in diet, and stress with children putting her house  on the market this week.  Denies any worsening shortness of breath, chest pain or headache.  Does have some bilateral ankle edema.  She will keep log of blood  Pressures call if remains elevated.  She reports weight at home is 163 lbs, noted to be  166 lbs here last two visits.  Blood pressure has been normal until this visit.  Chest x ray and BNP today.  Will have her take Lasix 20 mg once today, she takes only PRN.  Will have her continue current antihypertensive. She is not on metoprolol it is on her medication list however she reports she has not been on it for years as she could not tolerate. Marked as not taking.  She has a follow up with her cardiologist tomorrow 07/14/21.. follow up scheduled with PCP on 07/21/21 return if any symptoms worsening.       Relevant Orders   B Nat Peptide (Completed)   DG Chest 2 View (Completed)   Varicose veins of both lower extremities    Chronic         Musculoskeletal and Integument   Basal cell carcinoma (BCC) of right lower extremity    Noted 4 years ago seen dermatology she is unsure who she seen, needs referral for new lesion. Placed  Referral.       Relevant Orders   Ambulatory referral to Dermatology   Skin lesion - Primary    Right lower anterior leg lateral , skin lesion, patient reports has been present 3-4 weeks, given her history of skin cancer warrants seeing dermatology for evaluation. Referral placed today 07/13/21. Call if not heard in 2 weeks.       Relevant Orders   Ambulatory referral to Dermatology     Other   Edema    Bilateral ankle edema.   prior bilateral great saphenous vein stripping noted on vascular evaluation [facility] Oct 2015 Bilateral incompetent varicosities noted       Relevant Orders   B Nat Peptide (Completed)   Right leg pain    Pain and erythema has resolved, she took 5 days  of clindamycin, stopped for nausea. No diarrhea.        Keep cardiology visit on 07/14/21 for follow up and hypertension  recheck.  Avoid high sodium food choices.  Keep follow up with Crecencio Mc, MD 07/21/21.   No orders of the defined types were placed in this encounter. Red Flags discussed. The patient was given clear instructions to go to ER or return to medical center if any red flags develop, symptoms do not improve, worsen or new problems develop. They verbalized understanding.   Return in about 1 week (around 07/20/2021), or if symptoms worsen or fail to improve, for at any time for any worsening symptoms, Go to Emergency room/ urgent care if worse.  Marcille Buffy, FNP

## 2021-07-14 ENCOUNTER — Telehealth: Payer: Self-pay

## 2021-07-14 NOTE — Telephone Encounter (Signed)
I called patient in regards to  Narrative & Impression  CLINICAL DATA:  Shortness of breath since starting on clindamycin on July 06, 2021.  Pt did not stop taking clindamycin due to SOB she had stopped due to nausea & some dizziness she stated. She said that she saw her cardiologist this morning & was placed back in Lopressor 25 mg. Pt stated that she was well today & thanked me for the call.

## 2021-07-15 ENCOUNTER — Ambulatory Visit: Payer: Medicare Other | Admitting: *Deleted

## 2021-07-15 DIAGNOSIS — I10 Essential (primary) hypertension: Secondary | ICD-10-CM

## 2021-07-15 DIAGNOSIS — I429 Cardiomyopathy, unspecified: Secondary | ICD-10-CM

## 2021-07-15 DIAGNOSIS — I482 Chronic atrial fibrillation, unspecified: Secondary | ICD-10-CM

## 2021-07-15 NOTE — Chronic Care Management (AMB) (Signed)
Chronic Care Management   CCM RN Visit Note  07/15/2021 Name: Linda [name] MRN: [id number] DOB: [birth year]  Subjective: Linda [name] is a 88 y.o. year old female who is a primary care patient of Crecencio Mc, MD. The care management team was consulted for assistance with disease management and care coordination needs.    Engaged with patient by telephone for follow up visit in response to provider referral for case management and/or care coordination services.   Consent to Services:  The patient was given information about Chronic Care Management services, agreed to services, and gave verbal consent prior to initiation of services.  Please see initial visit note for detailed documentation.   Patient agreed to services and verbal consent obtained.   Assessment: Review of patient past medical history, allergies, medications, health status, including review of consultants reports, laboratory and other test data, was performed as part of comprehensive evaluation and provision of chronic care management services.   SDOH (Social Determinants of Health) assessments and interventions performed:    CCM Care Plan  Allergies  Allergen Reactions   Barium Iodide     Unknown   Crestor [Rosuvastatin Calcium] Other (See Comments)    Myalgia    Penicillins Rash   Sulfa Antibiotics Rash   Sulfonamide Derivatives Rash    Outpatient Encounter Medications as of 07/15/2021  Medication Sig Note   metoprolol tartrate (LOPRESSOR) 25 MG tablet Take by mouth. 07/15/2021: REPORTS TAKING MEDICATION NOW   acetaminophen (TYLENOL) 500 MG tablet Take 500 mg by mouth every 6 (six) hours as needed for moderate pain or headache.    cetirizine (ZYRTEC) 10 MG tablet Take 10 mg by mouth daily as needed for allergies.    Cholecalciferol (VITAMIN D) 2000 units tablet Take 2,000 Units by mouth daily.    clindamycin (CLEOCIN) 300 MG capsule Take 1 capsule (300 mg total) by mouth 3 (three) times daily for 10 days.     ELIQUIS 5 MG TABS tablet Take 1 tablet (5 mg total) by mouth 2 (two) times daily.    estradiol (ESTRACE) 0.1 MG/GM vaginal cream Place 1 g vaginally 3 (three) times a week. Pea sized amount per urethra    furosemide (LASIX) 20 MG tablet Take 40 mg by mouth daily. 04/08/2021: Reports taking as needed   Ketotifen Fumarate (ALLERGY EYE DROPS OP) Place 1 drop into both eyes daily as needed (allergies).    LORazepam (ATIVAN) 0.5 MG tablet Take 1 tablet (0.5 mg total) by mouth at bedtime as needed for anxiety.    losartan (COZAAR) 25 MG tablet Take 1 tablet (25 mg total) by mouth daily. (Patient taking differently: Take 12.5 mg by mouth at bedtime.) 08/25/2020: Reports taking 1/2 tablet nightly   losartan (COZAAR) 25 MG tablet Take 1 tablet by mouth daily.    potassium chloride (KLOR-CON) 10 MEQ tablet Take 10-20 mEq by mouth daily. 12/24/2020: Medication discontinued after surgery   spironolactone (ALDACTONE) 25 MG tablet Take 1 tablet by mouth daily.    traZODone (DESYREL) 50 MG tablet Take 50 mg by mouth at bedtime.    No facility-administered encounter medications on file as of 07/15/2021.    Patient Active Problem List   Diagnosis Date Noted   Varicose veins of both lower extremities 07/13/2021   Injury of right leg 07/06/2021   Right leg pain 07/06/2021   Callus of foot 07/06/2021   Proximal muscle weakness 08/24/2020   Mitral valve disease 07/09/2020   Fatigue 05/29/2020   Diuretic-induced  hypokalemia 12/16/2019   Myalgia due to statin 12/15/2019   Acquired thrombophilia ([place]) 12/15/2019   Skin lesion 08/27/2018   Hospital discharge follow-up 05/25/2018   History of CVA (cerebrovascular accident) without residual deficits 05/10/2018   Parent-child estrangement nec 02/09/2018   Basal cell carcinoma (BCC) of right lower extremity 08/08/2017   Hematuria, gross 08/08/2017   Extremity atherosclerosis with intermittent claudication ([place]) 08/08/2017   Generalized anxiety disorder 01/31/2017    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   Controlled type 2 diabetes mellitus with microalbuminuria, without long-term current use of insulin ([place]) 01/20/2016   Insomnia secondary to anxiety 10/09/2015   Cardiomyopathy, idiopathic ([place]) 09/03/2015   Polycythemia, secondary 07/04/2015   Pre-syncope 07/02/2015   Osteopenia 08/21/2014   Gastritis 05/26/2014   GERD (gastroesophageal reflux disease) 03/04/2014   Medicare annual wellness visit, subsequent 01/13/2014   Essential hypertension 11/04/2013   Screening for breast cancer 07/01/2013   Left shoulder pain 03/21/2013   Hoarseness of voice 03/21/2013   Edema 09/05/2010   VENTRICULAR HYPERTROPHY, LEFT 08/16/2010   Hyperlipidemia 06/03/2010   ATRIAL FIBRILLATION 08/13/2009    Conditions to be addressed/monitored:Atrial Fibrillation, CHF, and HTN  Care Plan : Heart Failure  Updates made by Leona Singleton, RN since 07/15/2021 12:00 AM     Problem: Heart Failure (increasing edema and shortness of breath   Priority: Medium     Long-Range Goal: Symptom Exacerbation Prevented or Minimized   Start Date: 08/25/2020  Expected End Date: 06/17/2021  Recent Progress: On track  Priority: Medium  Note:   Current Barriers:   Knowledge deficits related to basic heart failure pathophysiology and self care management as evidenced by increasing shortness of breath with exertion and increase in lower extremity edema.  Unable to perform ADLs independently Unable to perform IADLs independently Financial strain-patient reporting her Eliquis copay has increased to $100 monthly.   Patient reporting being real busy with multiple doctor appointments.  Has Dermatology appointment for hematoma/sore on leg.  Reporting she had periods light headiness and dizziness feeling faint.  Blood pressures have been elevated.  Instructed to take Lasix for a week and to start on Toprol XL.  States yesterday was first day of taking Toprol.  BP 166/99 today.  Does state she  feels better today than the past few days.  Denies shortness of breath or swelling in lower extremities.  Weight this morning 162.  Nurse Case Manager Clinical Goal(s):  Patient will report no hospitalization related to heart failure exacerbation within the next 90 days. patient will weigh self daily and record patient will verbalize understanding of Heart Failure Action Plan and when to call doctor patient will take all Heart Failure mediations as prescribed Patient will continue to monitor heart rates and notify provider if elevated heart rates sustains   Heart Failure Interventions:   Goal on rack (progressing): YES. Collaboration with Crecencio Mc, MD regarding development and update of comprehensive plan of care as evidenced by provider attestation and co-signature Inter-disciplinary care team collaboration (see longitudinal plan of care) Basic overview and discussion of pathophysiology of Heart Failure Reviewed Heart Failure Action Plan in depth and provided written copy; encouraged patient to review Encouraged continued daily weight monitoring, documenting in log for provider review Reviewed medications and encouraged medication compliance; discussed timing of taking second fluid pill  Reviewed role of diuretics in prevention of fluid overload and dosage of Lasix (patient verbalized her understanding and states taking 1 tablets daily) Discussed increased heart rates and atrial fibrillation and encouraged  patient to continue to monitor feelings of rapid rates and to notify provider if rates sustains                                                                                                                                                                                                         Self-awareness of signs/symptoms of worsening disease encouraged Sent heart failure zones and action plan education; sent EMMI Video for Atrial Fibrillation and Heart Failure rest and exercise  and encouraged patient to review Encouraged patient to continue to use 2022 Calendar Booklet for documentation of vital signs Fall precautions and preventions reviewed and discussed  A-fib:  (Status: Goal on Track (progressing): YES. Condition stable. Not addressed this visit.) Long Term Goal  Counseled on increased risk of stroke due to Afib and benefits of anticoagulation for stroke prevention           Reviewed importance of adherence to anticoagulant exactly as prescribed Counseled on seeking medical attention after a head injury or if there is blood in the urine/stool Afib action plan reviewed   Hypertension: (Status: Goal on track: NO.)  Long Term Goal  Last practice recorded BP readings:  BP Readings from Last 3 Encounters:  07/13/21 140/80  07/06/21 128/86  03/22/21 124/78  Most recent GFR/CrCl: No results found for: EGFR  No components found for: CRCL  Evaluation of current treatment plan related to hypertension self management and patient's adherence to plan as established by provider;   Reviewed prescribed diet low salt Reviewed medications with patient and discussed importance of compliance;  Discussed plans with patient for ongoing care management follow up and provided patient with direct contact information for care management team; Advised patient, providing education and rationale, to monitor blood pressure daily and record, calling PCP for findings outside established parameters;  Reviewed scheduled/upcoming provider appointments including:  Advised patient to discuss hematoma to back 52fleg with provider;  Check blood pressure daily and notify Cardiologist if BP still elevated after medication changes  Patient Goals/Self-Care Activities:  Call office if I gain more than 2 pounds in one day or 5 pounds in one week Keep legs up/elevated while sitting and wear support hose Watch for swelling in feet, ankles and legs every day Weigh myself daily and keep in log to take  to provider for review Follow rescue plan and know when to call the doctor Continue to monitor blood pressures daily taking log to provider review/monitoring for light headiness Check blood pressures daily and notify providers for sustained elevations Follow Up Plan: The care  management team will reach out to the patient again over the next 30 business days.        Plan:The care management team will reach out to the patient again over the next 30 days.  [name] RN, MSN RN Care Management Coordinator [place] [phone] [email or web address]

## 2021-07-15 NOTE — Patient Instructions (Signed)
Visit Information  Thank you for taking time to visit with me today. Please don't hesitate to contact me if I can be of assistance to you before our next scheduled telephone appointment.  Following are the goals we discussed today:  Call office if I gain more than 2 pounds in one day or 5 pounds in one week Keep legs up/elevated while sitting and wear support hose Watch for swelling in feet, ankles and legs every day Weigh myself daily and keep in log to take to provider for review Follow rescue plan and know when to call the doctor Continue to monitor blood pressures daily taking log to provider review/monitoring for light headiness Check blood pressures daily and notify providers for sustained elevations  Our next appointment is by telephone on 2/17 at 1315  Please call the care guide team at [phone] if you need to cancel or reschedule your appointment.   If you are experiencing a Mental Health or [place] or need someone to talk to, please call the Suicide and Crisis Lifeline: 988 call the [place] National Suicide Prevention Lifeline: (240)441-8047 or TTY: (517)713-2563 TTY 763-207-7996) to talk to a trained counselor call 1-800-273-TALK (toll free, 24 hour hotline) call 911   Patient verbalizes understanding of instructions and care plan provided today and agrees to view in [place]. Active MyChart status confirmed with patient.    [name] RN, MSN RN Care Management Coordinator [place] [phone] [email or web address]

## 2021-07-19 ENCOUNTER — Ambulatory Visit (INDEPENDENT_AMBULATORY_CARE_PROVIDER_SITE_OTHER): Payer: Medicare Other | Admitting: Dermatology

## 2021-07-19 ENCOUNTER — Other Ambulatory Visit: Payer: Self-pay

## 2021-07-19 DIAGNOSIS — Z85828 Personal history of other malignant neoplasm of skin: Secondary | ICD-10-CM | POA: Diagnosis not present

## 2021-07-19 DIAGNOSIS — C44722 Squamous cell carcinoma of skin of right lower limb, including hip: Secondary | ICD-10-CM | POA: Diagnosis not present

## 2021-07-19 DIAGNOSIS — L814 Other melanin hyperpigmentation: Secondary | ICD-10-CM | POA: Diagnosis not present

## 2021-07-19 DIAGNOSIS — I509 Heart failure, unspecified: Secondary | ICD-10-CM

## 2021-07-19 DIAGNOSIS — L570 Actinic keratosis: Secondary | ICD-10-CM

## 2021-07-19 DIAGNOSIS — C4492 Squamous cell carcinoma of skin, unspecified: Secondary | ICD-10-CM

## 2021-07-19 DIAGNOSIS — D692 Other nonthrombocytopenic purpura: Secondary | ICD-10-CM

## 2021-07-19 DIAGNOSIS — I110 Hypertensive heart disease with heart failure: Secondary | ICD-10-CM

## 2021-07-19 DIAGNOSIS — D485 Neoplasm of uncertain behavior of skin: Secondary | ICD-10-CM

## 2021-07-19 DIAGNOSIS — I4891 Unspecified atrial fibrillation: Secondary | ICD-10-CM

## 2021-07-19 DIAGNOSIS — L821 Other seborrheic keratosis: Secondary | ICD-10-CM | POA: Diagnosis not present

## 2021-07-19 HISTORY — DX: Squamous cell carcinoma of skin, unspecified: C44.92

## 2021-07-19 NOTE — Progress Notes (Signed)
New Patient Visit  Subjective  [name] is a 88 y.o. female who presents for the following: Skin Problem (Patient here today for a few spots at hands, sometimes itchy, present for about a month. Patient also with a spot at right lower leg, has gotten sore and itchy, present for a few weeks. She did bump the leg about 2 months ago and had an ultrasound to make sure everything was ok, ultrasound came back fine. Patient does have hx of BCC at right lower leg about 5-6 years ago. ).   The following portions of the chart were reviewed this encounter and updated as appropriate:       Review of Systems:  No other skin or systemic complaints except as noted in HPI or Assessment and Plan.  Objective  Well appearing patient in no apparent distress; mood and affect are within normal limits.  A focused examination was performed including hands, right lower leg. Relevant physical exam findings are noted in the Assessment and Plan.  right lower pretibia lateral 1.0 cm pink scaly papule     left hand dorsum x 4, right hand x 1 (5) Keratotic pink papules     Assessment & Plan  Neoplasm of uncertain behavior of skin right lower pretibia lateral  Epidermal / dermal shaving  Lesion diameter (cm):  1 Informed consent: discussed and consent obtained   Patient was prepped and draped in usual sterile fashion: Area prepped with alcohol. Anesthesia: the lesion was anesthetized in a standard fashion   Anesthetic:  1% lidocaine w/ epinephrine 1-100,000 buffered w/ 8.4% NaHCO3 Instrument used: flexible razor blade   Hemostasis achieved with: pressure, aluminum chloride and electrodesiccation   Outcome: patient tolerated procedure well    Destruction of lesion  Destruction method: electrodesiccation and curettage   Informed consent: discussed and consent obtained   Timeout:  patient name, date of birth, surgical site, and procedure verified Curettage performed in three different  directions: Yes   Electrodesiccation performed over the curetted area: Yes   Final wound size (cm):  1.5 Hemostasis achieved with:  pressure, aluminum chloride and electrodesiccation Outcome: patient tolerated procedure well with no complications   Post-procedure details: wound care instructions given   Additional details:  Mupirocin ointment and Bandaid applied    Specimen 1 - Surgical pathology Differential Diagnosis: ISK vs prurigo nodule vs SCC  Check Margins: No 1.0 cm pink scaly papule  Patient advises may leave results on answering machine  Hypertrophic actinic keratosis (5) left hand dorsum x 4, right hand x 1  vs ISK  Recheck on f/up, biopsy if not improved  Destruction of lesion - left hand dorsum x 4, right hand x 1  Destruction method: cryotherapy   Informed consent: discussed and consent obtained   Lesion destroyed using liquid nitrogen: Yes   Region frozen until ice ball extended beyond lesion: Yes   Outcome: patient tolerated procedure well with no complications   Post-procedure details: wound care instructions given   Additional details:  Prior to procedure, discussed risks of blister formation, small wound, skin dyspigmentation, or rare scar following cryotherapy. Recommend Vaseline ointment to treated areas while healing.   Lentigines - Scattered tan macules - Due to sun exposure - Benign-appering, observe - Recommend daily broad spectrum sunscreen SPF 30+ to sun-exposed areas, reapply every 2 hours as needed. - Call for any changes  Seborrheic Keratoses - Stuck-on, waxy, tan-brown papules and/or plaques  - Benign-appearing - Discussed benign etiology and prognosis. - Observe -  Call for any changes  Purpura - Chronic; persistent and recurrent.  Treatable, but not curable. - Violaceous macules and patches with hyperpigmentation - Benign - Related to trauma, age, sun damage and/or use of blood thinners, chronic use of topical and/or oral steroids -  Observe - Can use OTC arnica containing moisturizer such as Dermend Bruise Formula if desired - Call for worsening or other concerns   Return for [place] follow up 6-8 weeks.  Graciella Belton, RMA, am acting as scribe for Brendolyn Patty, MD .  Documentation: I have reviewed the above documentation for accuracy and completeness, and I agree with the above.  Brendolyn Patty MD

## 2021-07-19 NOTE — Patient Instructions (Addendum)
Wound Care Instructions  Cleanse wound gently with soap and water once a day then pat dry with clean gauze. Apply a thing coat of Petrolatum (petroleum jelly, "Vaseline") over the wound (unless you have an allergy to this). We recommend that you use a new, sterile tube of Vaseline. Do not pick or remove scabs. Do not remove the yellow or white "healing tissue" from the base of the wound.  Cover the wound with fresh, clean, nonstick gauze and secure with paper tape. You may use Band-Aids in place of gauze and tape if the would is small enough, but would recommend trimming much of the tape off as there is often too much. Sometimes Band-Aids can irritate the skin.  You should call the office for your biopsy report after 1 week if you have not already been contacted.  If you experience any problems, such as abnormal amounts of bleeding, swelling, significant bruising, significant pain, or evidence of infection, please call the office immediately.  FOR ADULT SURGERY PATIENTS: If you need something for pain relief you may take 1 extra strength Tylenol (acetaminophen) AND 2 Ibuprofen (200mg  each) together every 4 hours as needed for pain. (do not take these if you are allergic to them or if you have a reason you should not take them.) Typically, you may only need pain medication for 1 to 3 days.   Cryotherapy Aftercare  Wash gently with soap and water everyday.   Apply Vaseline and Band-Aid daily until healed.    If You Need Anything After Your Visit  If you have any questions or concerns for your doctor, please call our main line at [phone] and press option 4 to reach your doctor's medical assistant. If no one answers, please leave a voicemail as directed and we will return your call as soon as possible. Messages left after 4 pm will be answered the following business day.   You may also send [place] a message via [place]. We typically respond to MyChart messages within 1-2 business days.  For  prescription refills, please ask your pharmacy to contact our office. Our fax number is [phone].  If you have an urgent issue when the clinic is closed that cannot wait until the next business day, you can page your doctor at the number below.    Please note that while we do our best to be available for urgent issues outside of office hours, we are not available 24/7.   If you have an urgent issue and are unable to reach [place], you may choose to seek medical care at your doctor's office, retail clinic, urgent care center, or emergency room.  If you have a medical emergency, please immediately call 911 or go to the emergency department.  Pager Numbers  - Dr. Nehemiah Massed: [phone]  - Dr. Laurence Ferrari: [phone]  - Dr. Nicole Kindred: [phone]  In the event of inclement weather, please call our main line at [phone] for an update on the status of any delays or closures.  Dermatology Medication Tips: Please keep the boxes that topical medications come in in order to help keep track of the instructions about where and how to use these. Pharmacies typically print the medication instructions only on the boxes and not directly on the medication tubes.   If your medication is too expensive, please contact our office at [phone] option 4 or send [place] a message through [place].   We are unable to tell what your co-pay for medications will be in advance as this is  different depending on your insurance coverage. However, we may be able to find a substitute medication at lower cost or fill out paperwork to get insurance to cover a needed medication.   If a prior authorization is required to get your medication covered by your insurance company, please allow [place] 1-2 business days to complete this process.  Drug prices often vary depending on where the prescription is filled and some pharmacies may offer cheaper prices.  The website www.goodrx.com contains coupons for medications through different  pharmacies. The prices here do not account for what the cost may be with help from insurance (it may be cheaper with your insurance), but the website can give you the price if you did not use any insurance.  - You can print the associated coupon and take it with your prescription to the pharmacy.  - You may also stop by our office during regular business hours and pick up a GoodRx coupon card.  - If you need your prescription sent electronically to a different pharmacy, notify our office through [facility] or by phone at [phone] option 4.     Si Usted Necesita Algo Despus de Su Visita  Tambin puede enviarnos un mensaje a travs de Pharmacist, community. Por lo general respondemos a los mensajes de MyChart en el transcurso de 1 a 2 das hbiles.  Para renovar recetas, por favor pida a su farmacia que se ponga en contacto con nuestra oficina. Harland Dingwall de fax es [place] [phone].  Si tiene un asunto urgente cuando la clnica est cerrada y que no puede esperar hasta el siguiente da hbil, puede llamar/localizar a su doctor(a) al nmero que aparece a continuacin.   Por favor, tenga en cuenta que aunque hacemos todo lo posible para estar disponibles para asuntos urgentes fuera del horario de [place], no estamos disponibles las 24 horas del da, los 7 das de la [place].   Si tiene un problema urgente y no puede comunicarse con nosotros, puede optar por buscar atencin mdica  en el consultorio de su doctor(a), en una clnica privada, en un centro de atencin urgente o en una sala de emergencias.  Si tiene Engineering geologist, por favor llame inmediatamente al 911 o vaya a la sala de emergencias.  Nmeros de bper  - Dr. Nehemiah Massed: [phone]  - Dra. Moye: [phone]  - Dra. Nicole Kindred: [phone]  En caso de inclemencias del [place], por favor llame a Johnsie Kindred principal al [phone] para una actualizacin sobre el [place] de cualquier retraso o cierre.  Consejos para la  medicacin en dermatologa: Por favor, guarde las cajas en las que vienen los medicamentos de uso tpico para ayudarle a seguir las instrucciones sobre dnde y cmo usarlos. Las farmacias generalmente imprimen las instrucciones del medicamento slo en las cajas y no directamente en los tubos del [place].   Si su medicamento es muy caro, por favor, pngase en contacto con Zigmund Daniel llamando al [phone] y presione la opcin 4 o envenos un mensaje a travs de Pharmacist, community.   No podemos decirle cul ser su copago por los medicamentos por adelantado ya que esto es diferente dependiendo de la cobertura de su seguro. Sin embargo, es posible que podamos encontrar un medicamento sustituto a Electrical engineer un formulario para que el seguro cubra el medicamento que se considera necesario.   Si se requiere una autorizacin previa para que su compaa de seguros [place] su medicamento, por favor permtanos de 1 a 2 das hbiles para completar  este proceso.  Los precios de los medicamentos varan con frecuencia dependiendo del Environmental consultant de dnde se surte la receta y alguna farmacias pueden ofrecer precios ms baratos.  El sitio web www.goodrx.com tiene cupones para medicamentos de Airline pilot. Los precios aqu no tienen en cuenta lo que podra costar con la ayuda del seguro (puede ser ms barato con su seguro), pero el sitio web puede darle el precio si no utiliz Research scientist (physical sciences).  - Puede imprimir el cupn correspondiente y llevarlo con su receta a la farmacia.  - Tambin puede pasar por nuestra oficina durante el horario de atencin regular y Charity fundraiser una tarjeta de cupones de GoodRx.  - Si necesita que su receta se enve electrnicamente a una farmacia diferente, informe a nuestra oficina a travs de MyChart de [place] o por telfono llamando al [phone] y presione la opcin 4.

## 2021-07-21 ENCOUNTER — Ambulatory Visit (INDEPENDENT_AMBULATORY_CARE_PROVIDER_SITE_OTHER): Payer: Medicare Other | Admitting: Internal Medicine

## 2021-07-21 ENCOUNTER — Other Ambulatory Visit: Payer: Self-pay

## 2021-07-21 ENCOUNTER — Encounter: Payer: Self-pay | Admitting: Internal Medicine

## 2021-07-21 VITALS — BP 156/84 | HR 59 | Temp 97.4°F | Ht 63.5 in | Wt 167.0 lb

## 2021-07-21 DIAGNOSIS — R809 Proteinuria, unspecified: Secondary | ICD-10-CM

## 2021-07-21 DIAGNOSIS — E1129 Type 2 diabetes mellitus with other diabetic kidney complication: Secondary | ICD-10-CM | POA: Diagnosis not present

## 2021-07-21 DIAGNOSIS — E782 Mixed hyperlipidemia: Secondary | ICD-10-CM

## 2021-07-21 DIAGNOSIS — R600 Localized edema: Secondary | ICD-10-CM | POA: Diagnosis not present

## 2021-07-21 DIAGNOSIS — I10 Essential (primary) hypertension: Secondary | ICD-10-CM | POA: Diagnosis not present

## 2021-07-21 DIAGNOSIS — I517 Cardiomegaly: Secondary | ICD-10-CM

## 2021-07-21 DIAGNOSIS — D6869 Other thrombophilia: Secondary | ICD-10-CM

## 2021-07-21 LAB — MICROALBUMIN / CREATININE URINE RATIO
Creatinine,U: 39.8 mg/dL
Microalb Creat Ratio: 18.8 mg/g (ref 0.0–30.0)
Microalb, Ur: 7.5 mg/dL — ABNORMAL HIGH (ref 0.0–1.9)

## 2021-07-21 LAB — BASIC METABOLIC PANEL
BUN: 23 mg/dL (ref 6–23)
CO2: 36 mEq/L — ABNORMAL HIGH (ref 19–32)
Calcium: 9.5 mg/dL (ref 8.4–10.5)
Chloride: 99 mEq/L (ref 96–112)
Creatinine, Ser: 0.79 mg/dL (ref 0.40–1.20)
GFR: 68.08 mL/min (ref >60.00)
Glucose, Bld: 107 mg/dL — ABNORMAL HIGH (ref 70–99)
Potassium: 3.4 mEq/L — ABNORMAL LOW (ref 3.5–5.1)
Sodium: 143 mEq/L (ref 135–145)

## 2021-07-21 LAB — LIPID PANEL
Cholesterol: 165 mg/dL (ref 0–200)
HDL: 68.3 mg/dL (ref >39.00)
LDL Cholesterol: 79 mg/dL (ref 0–99)
NonHDL: 97.07
Total CHOL/HDL Ratio: 2
Triglycerides: 88 mg/dL (ref 0.0–149.0)
VLDL: 17.6 mg/dL (ref 0.0–40.0)

## 2021-07-21 LAB — POCT GLYCOSYLATED HEMOGLOBIN (HGB A1C): Hemoglobin A1C: 6.1 % — AB (ref 4.0–5.6)

## 2021-07-21 MED ORDER — LOSARTAN POTASSIUM 25 MG PO TABS: 25.0000 mg | ORAL_TABLET | Freq: Every day | ORAL | 1 refills | Status: DC

## 2021-07-21 NOTE — Assessment & Plan Note (Addendum)
She is  tolerating apixiban for reduction of embolic stroke risk . History of significant traumatic hematoma to RLE that occurred in November 2022

## 2021-07-21 NOTE — Assessment & Plan Note (Signed)
Metoprolol xl added 12.5 mg daily with follow up next eek planned

## 2021-07-21 NOTE — Assessment & Plan Note (Addendum)
Currently well-controlled without  medications .Patient is reminded to schedule an annual eye exam and foot exam is normal today. Patient has no microalbuminuria. Patient  Has a history of statin induced myalgias.  Continue  ACE/ARB for renal protection and hypertension   Lab Results  Component Value Date   HGBA1C 6.1 (A) 07/21/2021   Lab Results  Component Value Date   LABMICR See below: 11/15/2017   LABMICR See below: 06/26/2017   MICROALBUR 4.9 (H) 09/10/2020   MICROALBUR 1.4 12/15/2019

## 2021-07-21 NOTE — Patient Instructions (Signed)
Good to see  you!  Your diabetes remains under excellent control    I will send the labs to Dr Corky Sox

## 2021-07-21 NOTE — Progress Notes (Signed)
Subjective:  Patient ID: Linda [name], female    DOB: 1934-05-01  Age: 88 y.o. MRN: [id number]  CC: The primary encounter diagnosis was Controlled type 2 diabetes mellitus with microalbuminuria, without long-term current use of insulin ([name]). Diagnoses of Mixed hyperlipidemia, Essential hypertension, Localized edema, VENTRICULAR HYPERTROPHY, LEFT, and Acquired thrombophilia ([place] [name]) were also pertinent to this visit.   This visit occurred during the SARS-CoV-2 public health emergency.  Safety protocols were in place, including screening questions prior to the visit, additional usage of staff PPE, and extensive cleaning of exam room while observing appropriate contact time as indicated for disinfecting solutions.    HPI [facility] for  Chief Complaint  Patient presents with   Follow-up    Follow up on diabetes and hypertension   s T2DM:  She  feels generally well,  But is not  exercising regularly or trying to lose weight. Does not check blood sugar. Now living at [name] and enjoying their "no sugar added"  desserts.  Denies any recent hypoglyemic events.  Taking medications as directed. Following a carbohydrate modified diet 6 days per week. Denies numbness, burning and tingling of extremities. Appetite is good.    Hypertension:  Patient is taking her medications as prescribed and notes no adverse effects.  Home BP readings have been done about every other day  but not [place].   She is avoiding added salt in her diet and walking regularly about 3 times per week for exercise  . She has seen her new  cardiologist Dr Ubaldo Glassing retired,  now seeing Corky Sox at [name] on [name] )   Started taking metoprolol XL 12.5 mg daily along with lasix  20 mg daily for BP 160/100 during visit.  Not taking potassium currently,  has history of hypokalemia with previous lasix use   Has had persistent swelling of RLE  since November when she bumped  it on her cane while walking inside the house . Did not break skin ,   but the area of contact developed a large hematoma.  Was seen by [name] for same.  Tib/fib films and ultrasound were both normal .   Skin cancer excised from RLE  and several warts frozen off of both hand dorsum side   Anxiety  due to stress of relocation.  The move [name] after waiting 3 years,  was aggravated by losing her beloved caregiver and having several from "the agency" that were not good. Happy with the move,  has friends there (esp Cathie [name]),  but has not sold her house.  her children are handling the sale . Learning to use a new computer     Outpatient Medications Prior to Visit  Medication Sig Dispense Refill   acetaminophen (TYLENOL) 500 MG tablet Take 500 mg by mouth every 6 (six) hours as needed for moderate pain or headache.     cetirizine (ZYRTEC) 10 MG tablet Take 10 mg by mouth daily as needed for allergies.     Cholecalciferol (VITAMIN D) 2000 units tablet Take 2,000 Units by mouth daily.     ELIQUIS 5 MG TABS tablet Take 1 tablet (5 mg total) by mouth 2 (two) times daily. 60 tablet 0   estradiol (ESTRACE) 0.1 MG/GM vaginal cream Place 1 g vaginally 3 (three) times a week. Pea sized amount per urethra 42.5 g 12   furosemide (LASIX) 20 MG tablet Take 40 mg by mouth daily.     Ketotifen Fumarate (ALLERGY  EYE DROPS OP) Place 1 drop into both eyes daily as needed (allergies).     LORazepam (ATIVAN) 0.5 MG tablet Take 1 tablet (0.5 mg total) by mouth at bedtime as needed for anxiety. 30 tablet 0   metoprolol succinate (TOPROL-XL) [name] MG 24 hr tablet Take 12.5 mg by mouth daily.     traZODone (DESYREL) 50 MG tablet Take 50 mg by mouth at bedtime.     losartan (COZAAR) [name] MG tablet Take 1 tablet ([name] mg total) by mouth daily. (Patient taking differently: Take 12.5 mg by mouth at bedtime.) 90 tablet 1   metoprolol tartrate (LOPRESSOR) [name] MG tablet Take by mouth.     azithromycin (ZITHROMAX) 500 MG tablet Take 500 mg by mouth as directed. (Patient not taking:  Reported on 07/21/2021)     losartan (COZAAR) [name] MG tablet Take 1 tablet by mouth daily.     potassium chloride (KLOR-CON) 10 MEQ tablet Take 10-20 mEq by mouth daily.     spironolactone (ALDACTONE) [name] MG tablet Take 1 tablet by mouth daily. (Patient not taking: Reported on 07/21/2021)     No facility-administered medications prior to visit.    Review of Systems;  Patient denies headache, fevers, malaise, unintentional weight loss, skin rash, eye pain, sinus congestion and sinus pain, sore throat, dysphagia,  hemoptysis , cough, dyspnea, wheezing, chest pain, palpitations, orthopnea, edema, abdominal pain, nausea, melena, diarrhea, constipation, flank pain, dysuria, hematuria, urinary  Frequency, nocturia, numbness, tingling, seizures,  Focal weakness, Loss of consciousness,  Tremor, insomnia, depression, anxiety, and suicidal ideation.      Objective:  BP (!) 156/84 (BP Location: Left Arm, Patient Position: Sitting, Cuff Size: Normal)    Pulse (!) 59    Temp (!) 97.4 F (36.3 C) (Oral)    Ht 5' 3.5" (1.613 m)    Wt 167 lb (75.8 kg)    SpO2 94%    BMI 29.12 kg/m   BP Readings from Last 3 Encounters:  07/21/21 (!) 156/84  01/[name]/23 140/80  07/06/21 128/86    Wt Readings from Last 3 Encounters:  07/21/21 167 lb (75.8 kg)  01/[name]/23 166 lb 12.8 oz (75.7 kg)  07/06/21 166 lb 12.8 oz (75.7 kg)    General appearance: alert, cooperative and appears stated age Ears: normal TM's and external ear canals both ears Throat: lips, mucosa, and tongue normal; teeth and gums normal Neck: no adenopathy, no carotid bruit, supple, symmetrical, trachea midline and thyroid not enlarged, symmetric, no tenderness/mass/nodules Back: symmetric, no curvature. ROM normal. No CVA tenderness. Lungs: clear to auscultation bilaterally Heart: regular rate and rhythm, S1, S2 normal, no murmur, click, rub or gallop Abdomen: soft, non-tender; bowel sounds normal; no masses,  no organomegaly Pulses: 2+ and  symmetric Skin: Skin color, texture, turgor normal. No rashes or lesions Lymph nodes: Cervical, supraclavicular, and axillary nodes normal.  Lab Results  Component Value Date   HGBA1C 6.1 (A) 07/21/2021   HGBA1C 7.2 (H) 09/09/2020   HGBA1C 7.1 (H) 05/27/2020    Lab Results  Component Value Date   CREATININE 0.84 07/06/2021   CREATININE 0.85 09/09/2020   CREATININE 0.93 05/27/2020    Lab Results  Component Value Date   WBC 5.5 07/06/2021   HGB 14.9 07/06/2021   HCT 44.7 07/06/2021   PLT 164.0 07/06/2021   GLUCOSE 105 (H) 07/06/2021   CHOL 145 09/09/2020   TRIG 63.0 09/09/2020   HDL 53.70 09/09/2020   LDLDIRECT 90.0 10/23/2016   LDLCALC 79 09/09/2020   ALT  20 07/06/2021   AST 22 07/06/2021   NA 140 07/06/2021   K 3.6 07/06/2021   CL 99 07/06/2021   CREATININE 0.84 07/06/2021   BUN 24 (H) 07/06/2021   CO2 32 07/06/2021   TSH 1.91 05/27/2020   INR 1.78 05/11/2018   HGBA1C 6.1 (A) 07/21/2021   MICROALBUR 4.9 (H) 03/[name]/2022    US Venous Img Lower Unilateral Right (DVT)  Result Date: 07/11/2021 CLINICAL DATA:  Swelling and pain of right lower leg.  Injury. EXAM: RIGHT LOWER EXTREMITY VENOUS DOPPLER ULTRASOUND TECHNIQUE: Gray-scale sonography with compression, as well as color and duplex ultrasound, were performed to evaluate the deep venous system(s) from the level of the common femoral vein through the popliteal and proximal calf veins. COMPARISON:  None. FINDINGS: VENOUS Normal compressibility of the common femoral, superficial femoral, and popliteal veins, as well as the visualized calf veins. Visualized portions of profunda femoral vein and great saphenous vein unremarkable. No filling defects to suggest DVT on grayscale or color Doppler imaging. Doppler waveforms show normal direction of venous flow, normal respiratory plasticity and response to augmentation. Limited views of the contralateral common femoral vein are unremarkable. OTHER A simple fluid collection is seen  within the right popliteal fossa measuring 4.7 x 1.0 x 2.2 cm. Additional more heterogeneous avascular fluid collection seen within the anterior right lower leg in the area of the patient's injury measuring 5.1 x 0.8 x 2.5 cm. Limitations: none IMPRESSION:: IMPRESSION: 1. No deep venous thrombosis is visualized. 2. Probable Baker's cyst measuring up to 4.7 cm. 3. In the area of the patient's anterior right lower leg injury, there is more heterogeneous fluid collection, presumably a combination of soft tissue swelling, edema, and/or hematoma. Electronically Signed   By: Yvonne Kendall M.D.   On: 07/11/2021 11:58    Assessment & Plan:   Problem List Items Addressed This Visit     Hyperlipidemia   Relevant Medications   metoprolol succinate (TOPROL-XL) [name] MG 24 hr tablet   losartan (COZAAR) [name] MG tablet   Other Relevant Orders   Lipid Profile   VENTRICULAR HYPERTROPHY, LEFT    Metoprolol xl added 12.5 mg daily with follow up next eek planned       Relevant Medications   metoprolol succinate (TOPROL-XL) [name] MG 24 hr tablet   losartan (COZAAR) [name] MG tablet   Edema    Bilateral ankle edema secondary to venous insufficiency.  She has had prior bilateral great saphenous vein stripping,  noted on vascular evaluation [facility] Oct 2015 and Bilateral incompetent varicosities noted .  Now taking lasix 20 mg daily (prescribed by new cardiologist)      Essential hypertension    Loss of control noted.  It appears she has not been taking losartan [name] mg daily.  Will refill.  Metoprolol dose Managed by cardiologist, currently on   toprol XL 12.5 mg and furosemide 20 mg daily added last week.  Repeat BMET pending   Lab Results  Component Value Date   NA 140 07/06/2021   K 3.6 07/06/2021   CL 99 07/06/2021   CO2 32 07/06/2021   Lab Results  Component Value Date   CREATININE 0.84 07/06/2021         Relevant Medications   metoprolol succinate (TOPROL-XL) [name] MG 24 hr tablet   losartan (COZAAR) [name] MG  tablet   Other Relevant Orders   Microalbumin / creatinine urine ratio   Basic metabolic panel   Controlled type 2 diabetes mellitus with microalbuminuria,  without long-term current use of insulin ([place]) - Primary    Currently well-controlled without  medications .Patient is reminded to schedule an annual eye exam and foot exam is normal today. Patient has no microalbuminuria. Patient  Has a history of statin induced myalgias.  Continue  ACE/ARB for renal protection and hypertension   Lab Results  Component Value Date   HGBA1C 6.1 (A) 07/21/2021   Lab Results  Component Value Date   LABMICR See below: 11/15/2017   LABMICR See below: 06/26/2017   MICROALBUR 4.9 (H) 03/[name]/2022   MICROALBUR 1.4 12/15/2019          Relevant Medications   losartan (COZAAR) [name] MG tablet   Other Relevant Orders   Microalbumin / creatinine urine ratio   POCT HgB A1C (Completed)   Acquired thrombophilia ([place])    She is  tolerating apixiban for reduction of embolic stroke risk . History of significant traumatic hematoma to RLE that occurred in November 2022       I spent 30 minutes dedicated to the care of this patient on the date of this encounter to include pre-visit review of patient's medical history,  most recent imaging studies, Face-to-face time with the patient , and post visit ordering of testing and therapeutics.    Follow-up: No follow-ups on file.   Crecencio Mc, MD

## 2021-07-21 NOTE — Assessment & Plan Note (Addendum)
Loss of control noted.  It appears she has not been taking losartan 25 mg daily.  Will refill.  Metoprolol dose Managed by cardiologist, currently on   toprol XL 12.5 mg and furosemide 20 mg daily added last week.  Repeat BMET pending   Lab Results  Component Value Date   NA 140 07/06/2021   K 3.6 07/06/2021   CL 99 07/06/2021   CO2 32 07/06/2021   Lab Results  Component Value Date   CREATININE 0.84 07/06/2021

## 2021-07-21 NOTE — Assessment & Plan Note (Signed)
Bilateral ankle edema secondary to venous insufficiency.  She has had prior bilateral great saphenous vein stripping,  noted on vascular evaluation [facility] Oct 2015 and Bilateral incompetent varicosities noted .  Now taking lasix 20 mg daily (prescribed by new cardiologist)

## 2021-07-25 ENCOUNTER — Telehealth: Payer: Self-pay

## 2021-07-25 NOTE — Telephone Encounter (Signed)
Advised pt of bx result/sh ?

## 2021-07-25 NOTE — Telephone Encounter (Signed)
-----   Message from Brendolyn Patty, MD sent at 07/25/2021  8:28 AM EST ----- Skin , right lower pretibia lateral WELL DIFFERENTIATED SQUAMOUS CELL CARCINOMA, BASE INVOLVED  SCC skin cancer- already treated with EDC at time of biopsy    - please call patient

## 2021-08-05 ENCOUNTER — Ambulatory Visit (INDEPENDENT_AMBULATORY_CARE_PROVIDER_SITE_OTHER): Payer: Medicare Other | Admitting: *Deleted

## 2021-08-05 DIAGNOSIS — R809 Proteinuria, unspecified: Secondary | ICD-10-CM

## 2021-08-05 DIAGNOSIS — I10 Essential (primary) hypertension: Secondary | ICD-10-CM

## 2021-08-05 DIAGNOSIS — E1129 Type 2 diabetes mellitus with other diabetic kidney complication: Secondary | ICD-10-CM

## 2021-08-05 DIAGNOSIS — C44712 Basal cell carcinoma of skin of right lower limb, including hip: Secondary | ICD-10-CM

## 2021-08-07 NOTE — Chronic Care Management (AMB) (Signed)
Chronic Care Management   CCM RN Visit Note  08/07/2021 Name: Linda [name] MRN: [id number] DOB: May 30, 1934  Subjective: Linda [name] is a 88 y.o. year old female who is a primary care patient of Crecencio Mc, MD. The care management team was consulted for assistance with disease management and care coordination needs.    Engaged with patient by telephone for follow up visit in response to provider referral for case management and/or care coordination services.   Consent to Services:  The patient was given information about Chronic Care Management services, agreed to services, and gave verbal consent prior to initiation of services.  Please see initial visit note for detailed documentation.   Patient agreed to services and verbal consent obtained.   Assessment: Review of patient past medical history, allergies, medications, health status, including review of consultants reports, laboratory and other test data, was performed as part of comprehensive evaluation and provision of chronic care management services.   SDOH (Social Determinants of Health) assessments and interventions performed:    CCM Care Plan  Allergies  Allergen Reactions   Atorvastatin Other (See Comments)    Myalgias    Barium Iodide     Unknown   Statins Other (See Comments)    Myalgias    Crestor [Rosuvastatin Calcium] Other (See Comments)    Myalgia    Penicillins Rash   Sulfa Antibiotics Rash   Sulfonamide Derivatives Rash    Outpatient Encounter Medications as of 08/05/2021  Medication Sig Note   acetaminophen (TYLENOL) 500 MG tablet Take 500 mg by mouth every 6 (six) hours as needed for moderate pain or headache.    cetirizine (ZYRTEC) 10 MG tablet Take 10 mg by mouth daily as needed for allergies.    Cholecalciferol (VITAMIN D) 2000 units tablet Take 2,000 Units by mouth daily.    ELIQUIS 5 MG TABS tablet Take 1 tablet (5 mg total) by mouth 2 (two) times daily.    estradiol (ESTRACE) 0.1 MG/GM  vaginal cream Place 1 g vaginally 3 (three) times a week. Pea sized amount per urethra    furosemide (LASIX) 20 MG tablet Take 40 mg by mouth daily. 04/08/2021: Reports taking as needed   Ketotifen Fumarate (ALLERGY EYE DROPS OP) Place 1 drop into both eyes daily as needed (allergies).    LORazepam (ATIVAN) 0.5 MG tablet Take 1 tablet (0.5 mg total) by mouth at bedtime as needed for anxiety.    losartan (COZAAR) 25 MG tablet Take 1 tablet (25 mg total) by mouth daily.    metoprolol succinate (TOPROL-XL) 25 MG 24 hr tablet Take 12.5 mg by mouth daily.    traZODone (DESYREL) 50 MG tablet Take 50 mg by mouth at bedtime.    No facility-administered encounter medications on file as of 08/05/2021.    Patient Active Problem List   Diagnosis Date Noted   Varicose veins of both lower extremities 07/13/2021   Injury of right leg 07/06/2021   Right leg pain 07/06/2021   Callus of foot 07/06/2021   Proximal muscle weakness 08/24/2020   Mitral valve disease 07/09/2020   Fatigue 05/29/2020   Diuretic-induced hypokalemia 12/16/2019   Myalgia due to statin 12/15/2019   Acquired thrombophilia ([place]) 12/15/2019   Skin lesion 08/27/2018   Hospital discharge follow-up 05/25/2018   History of CVA (cerebrovascular accident) without residual deficits 05/10/2018   Parent-child estrangement nec 02/09/2018   Basal cell carcinoma (BCC) of right lower extremity 08/08/2017   Hematuria, gross 08/08/2017   Extremity atherosclerosis  with intermittent claudication ([place]) 08/08/2017   Generalized anxiety disorder 01/31/2017   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   Controlled type 2 diabetes mellitus with microalbuminuria, without long-term current use of insulin ([place]) 01/20/2016   Insomnia secondary to anxiety 10/09/2015   Cardiomyopathy, idiopathic ([place]) 09/03/2015   Polycythemia, secondary 07/04/2015   Pre-syncope 07/02/2015   Osteopenia 08/21/2014   Gastritis 05/26/2014   GERD (gastroesophageal reflux disease)  03/04/2014   Medicare annual wellness visit, subsequent 01/13/2014   Essential hypertension 11/04/2013   Screening for breast cancer 07/01/2013   Left shoulder pain 03/21/2013   Hoarseness of voice 03/21/2013   Edema 09/05/2010   VENTRICULAR HYPERTROPHY, LEFT 08/16/2010   Hyperlipidemia 06/03/2010   ATRIAL FIBRILLATION 08/13/2009    Conditions to be addressed/monitored:CHF and HTN  Care Plan : Heart Failure  Updates made by Leona Singleton, RN since 08/07/2021 12:00 AM     Problem: Heart Failure (increasing edema and shortness of breath   Priority: Medium     Long-Range Goal: Symptom Exacerbation Prevented or Minimized   Start Date: 08/25/2020  Expected End Date: 06/17/2021  Recent Progress: On track  Priority: Medium  Note:   Current Barriers:   Knowledge deficits related to basic heart failure pathophysiology and self care management as evidenced by increasing shortness of breath with exertion and increase in lower extremity edema.  Unable to perform ADLs independently Unable to perform IADLs independently Financial strain-patient reporting her Eliquis copay has increased to $100 monthly.   Patient reporting being real busy with multiple doctor appointments.  Has Dermatology appointment for hematoma/sore on leg.  Reporting she had periods light headiness and dizziness feeling faint.  Blood pressures have been elevated.  Instructed to take Lasix for a week and to start on Toprol XL.  States yesterday was first day of taking Toprol.  BP 166/99 today.  Does state she feels better today than the past few days.  Denies shortness of breath or swelling in lower extremities.  Weight this morning 162. 2/17--Reporting she does not feel well related to labile blood pressures.  States BP continues to range 160/90-100's.  Reports compliance with medications even Toprol.  Denies chest pains or increase in lower extremity edema; does admit to shortness of breath with exesion and having periods of  lightheadedness.  Leg wound biopsy + for skin CA per patient (awaiting plan).  Relieved that her house has been sold.  Weight this morning 162 pounds.  Continues to adjust to [place].  Nurse Case Manager Clinical Goal(s):  Patient will report no hospitalization related to heart failure exacerbation within the next 90 days. patient will weigh self daily and record patient will verbalize understanding of Heart Failure Action Plan and when to call doctor patient will take all Heart Failure mediations as prescribed Patient will continue to monitor heart rates and notify provider if elevated heart rates sustains   Heart Failure Interventions:   Goal on rack (progressing): YES. Collaboration with Crecencio Mc, MD regarding development and update of comprehensive plan of care as evidenced by provider attestation and co-signature Inter-disciplinary care team collaboration (see longitudinal plan of care) Basic overview and discussion of pathophysiology of Heart Failure Reviewed Heart Failure Action Plan in depth and provided written copy; encouraged patient to review Encouraged continued daily weight monitoring, documenting in log for provider review Reviewed medications and encouraged medication compliance; discussed timing of taking second fluid pill  Reviewed role of diuretics in prevention of fluid overload and dosage of Lasix (patient verbalized her understanding  and states taking 1 tablets daily) Discussed increased heart rates and atrial fibrillation and encouraged patient to continue to monitor feelings of rapid rates and to notify provider if rates sustains                                                                                                                                                                                                         Self-awareness of signs/symptoms of worsening disease encouraged Sent heart failure zones and action plan education; sent EMMI Video for Atrial  Fibrillation and Heart Failure rest and exercise and encouraged patient to review Encouraged patient to continue to use 2022 Calendar Booklet for documentation of vital signs Fall precautions and preventions reviewed and discussed Provided patient emotional support and empathy  A-fib:  (Status: Goal on Track (progressing): YES. Condition stable. Not addressed this visit.) Long Term Goal  Counseled on increased risk of stroke due to Afib and benefits of anticoagulation for stroke prevention           Reviewed importance of adherence to anticoagulant exactly as prescribed Counseled on seeking medical attention after a head injury or if there is blood in the urine/stool Afib action plan reviewed   Hypertension: (Status: Goal on track: NO.)  Long Term Goal  Last practice recorded BP readings:  BP Readings from Last 3 Encounters:  07/21/21 (!) 156/84  07/13/21 140/80  07/06/21 128/86  Most recent GFR/CrCl: No results found for: EGFR  No components found for: CRCL  Evaluation of current treatment plan related to hypertension self management and patient's adherence to plan as established by provider;   Reviewed prescribed diet low salt Reviewed medications with patient and discussed importance of compliance;  Discussed plans with patient for ongoing care management follow up and provided patient with direct contact information for care management team; Advised patient, providing education and rationale, to monitor blood pressure daily and record, calling PCP for findings outside established parameters;  Reviewed scheduled/upcoming provider appointments including:  Advised patient to discuss hematoma to back 19f leg with provider;  Check blood pressure daily and notify Cardiologist if BP still elevated  Discussed relaxation tips and encouraged patient to try  Patient Goals/Self-Care Activities:  Call office if I gain more than 2 pounds in one day or 5 pounds in one week Keep legs up/elevated  while sitting and wear support hose Watch for swelling in feet, ankles and legs every day Weigh myself daily and keep in log to take to provider for review Follow rescue plan and know when to call the doctor Continue  to monitor blood pressures daily taking log to provider review/monitoring for light headiness Check blood pressures daily and notify providers for sustained elevations Try to stay calm and relax Follow Up Plan: The care management team will reach out to the patient again over the next 30 business days.        Plan:The care management team will reach out to the patient again over the next 30 days.  [name] RN, MSN RN Care Management Coordinator [place] [phone] [email or web address]

## 2021-08-07 NOTE — Patient Instructions (Addendum)
Visit Information  Thank you for taking time to visit with me today. Please don't hesitate to contact me if I can be of assistance to you before our next scheduled telephone appointment.  Following are the goals we discussed today:  Call office if I gain more than 2 pounds in one day or 5 pounds in one week Keep legs up/elevated while sitting and wear support hose Watch for swelling in feet, ankles and legs every day Weigh myself daily and keep in log to take to provider for review Follow rescue plan and know when to call the doctor Continue to monitor blood pressures daily taking log to provider review/monitoring for light headiness Check blood pressures daily and notify providers for sustained elevations Try to stay calm and relax  Our next appointment is by telephone on 09/02/21 at [place]  Please call the care guide team at [phone] if you need to cancel or reschedule your appointment.   If you are experiencing a Mental Health or [place] or need someone to talk to, please call the Suicide and Crisis Lifeline: 988 call the [place] National Suicide Prevention Lifeline: 662-240-3569 or TTY: 337-197-5288 TTY 801 690 2494) to talk to a trained counselor call 1-800-273-TALK (toll free, 24 hour hotline) call 911   Patient verbalizes understanding of instructions and care plan provided today and agrees to view in [place]. Active MyChart status confirmed with patient.    [name] RN, MSN RN Care Management Coordinator [place] [phone] [email or web address]

## 2021-08-08 ENCOUNTER — Encounter: Payer: Self-pay | Admitting: Internal Medicine

## 2021-08-16 DIAGNOSIS — E1129 Type 2 diabetes mellitus with other diabetic kidney complication: Secondary | ICD-10-CM | POA: Diagnosis not present

## 2021-08-16 DIAGNOSIS — I10 Essential (primary) hypertension: Secondary | ICD-10-CM

## 2021-08-16 DIAGNOSIS — R809 Proteinuria, unspecified: Secondary | ICD-10-CM

## 2021-09-02 ENCOUNTER — Ambulatory Visit (INDEPENDENT_AMBULATORY_CARE_PROVIDER_SITE_OTHER): Payer: Medicare Other | Admitting: *Deleted

## 2021-09-02 DIAGNOSIS — I10 Essential (primary) hypertension: Secondary | ICD-10-CM

## 2021-09-02 DIAGNOSIS — C44712 Basal cell carcinoma of skin of right lower limb, including hip: Secondary | ICD-10-CM

## 2021-09-02 DIAGNOSIS — I429 Cardiomyopathy, unspecified: Secondary | ICD-10-CM

## 2021-09-02 NOTE — Chronic Care Management (AMB) (Signed)
?Chronic Care Management  ? ?CCM RN Visit Note ? ?09/02/2021 ?Name: Linda [name] MRN: [id number] DOB: July 11, 1933 ? ?Subjective: ?Linda [name] is a 88 y.o. year old female who is a primary care patient of Tullo, Aris Everts, MD. The care management team was consulted for assistance with disease management and care coordination needs.   ? ?Engaged with patient by telephone for follow up visit in response to provider referral for case management and/or care coordination services.  ? ?Consent to Services:  ?The patient was given information about Chronic Care Management services, agreed to services, and gave verbal consent prior to initiation of services.  Please see initial visit note for detailed documentation.  ? ?Patient agreed to services and verbal consent obtained.  ? ?Assessment: Review of patient past medical history, allergies, medications, health status, including review of consultants reports, laboratory and other test data, was performed as part of comprehensive evaluation and provision of chronic care management services.  ? ?SDOH (Social Determinants of Health) assessments and interventions performed:   ? ?CCM Care Plan ? ?Allergies  ?Allergen Reactions  ? Atorvastatin Other (See Comments)  ?  Myalgias ?  ? Barium Iodide   ?  Unknown  ? Statins Other (See Comments)  ?  Myalgias ?  ? Crestor [Rosuvastatin Calcium] Other (See Comments)  ?  Myalgia ?  ? Penicillins Rash  ? Sulfa Antibiotics Rash  ? Sulfonamide Derivatives Rash  ? ? ?Outpatient Encounter Medications as of 09/02/2021  ?Medication Sig Note  ? acetaminophen (TYLENOL) 500 MG tablet Take 500 mg by mouth every 6 (six) hours as needed for moderate pain or headache.   ? cetirizine (ZYRTEC) 10 MG tablet Take 10 mg by mouth daily as needed for allergies.   ? Cholecalciferol (VITAMIN D) 2000 units tablet Take 2,000 Units by mouth daily.   ? ELIQUIS 5 MG TABS tablet Take 1 tablet (5 mg total) by mouth 2 (two) times daily.   ? estradiol (ESTRACE) 0.1 MG/GM  vaginal cream Place 1 g vaginally 3 (three) times a week. Pea sized amount per urethra   ? furosemide (LASIX) 20 MG tablet Take 40 mg by mouth daily. 04/08/2021: Reports taking as needed  ? Ketotifen Fumarate (ALLERGY EYE DROPS OP) Place 1 drop into both eyes daily as needed (allergies).   ? LORazepam (ATIVAN) 0.5 MG tablet Take 1 tablet (0.5 mg total) by mouth at bedtime as needed for anxiety.   ? losartan (COZAAR) 25 MG tablet Take 1 tablet (25 mg total) by mouth daily.   ? metoprolol succinate (TOPROL-XL) 25 MG 24 hr tablet Take 12.5 mg by mouth daily.   ? traZODone (DESYREL) 50 MG tablet Take 50 mg by mouth at bedtime.   ? ?No facility-administered encounter medications on file as of 09/02/2021.  ? ? ?Patient Active Problem List  ? Diagnosis Date Noted  ? Varicose veins of both lower extremities 07/13/2021  ? Injury of right leg 07/06/2021  ? Right leg pain 07/06/2021  ? Callus of foot 07/06/2021  ? Proximal muscle weakness 08/24/2020  ? Mitral valve disease 07/09/2020  ? Fatigue 05/29/2020  ? Diuretic-induced hypokalemia 12/16/2019  ? Myalgia due to statin 12/15/2019  ? Acquired thrombophilia ([place]) 12/15/2019  ? Skin lesion 08/27/2018  ? Hospital discharge follow-up 05/25/2018  ? History of CVA (cerebrovascular accident) without residual deficits 05/10/2018  ? Parent-child estrangement Delma Post 02/09/2018  ? Basal cell carcinoma (BCC) of right lower extremity 08/08/2017  ? Hematuria, gross 08/08/2017  ? Extremity atherosclerosis  with intermittent claudication ([place]) 08/08/2017  ? Generalized anxiety disorder 01/31/2017  ? 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 Controlled type 2 diabetes mellitus with microalbuminuria, without long-term current use of insulin ([place]) 01/20/2016  ? Insomnia secondary to anxiety 10/09/2015  ? Cardiomyopathy, idiopathic ([place]) 09/03/2015  ? Polycythemia, secondary 07/04/2015  ? Pre-syncope 07/02/2015  ? Osteopenia 08/21/2014  ? Gastritis 05/26/2014  ? GERD (gastroesophageal reflux disease)  03/04/2014  ? Medicare annual wellness visit, subsequent 01/13/2014  ? Essential hypertension 11/04/2013  ? Screening for breast cancer 07/01/2013  ? Left shoulder pain 03/21/2013  ? Hoarseness of voice 03/21/2013  ? Edema 09/05/2010  ? VENTRICULAR HYPERTROPHY, LEFT 08/16/2010  ? Hyperlipidemia 06/03/2010  ? ATRIAL FIBRILLATION 08/13/2009  ? ? ?Conditions to be addressed/monitored:Atrial Fibrillation, CHF, and HTN ? ?Care Plan : Heart Failure  ?Updates made by Leona Singleton, RN since 09/02/2021 12:00 AM  ?  ? ?Problem: Heart Failure (increasing edema and shortness of breath   ?Priority: Medium  ?  ? ?Long-Range Goal: Symptom Exacerbation Prevented or Minimized   ?Start Date: 08/25/2020  ?Expected End Date: 06/17/2021  ?This Visit's Progress: Not on track  ?Recent Progress: On track  ?Priority: Medium  ?Note:   ?Current Barriers:   ?Knowledge deficits related to basic heart failure pathophysiology and self care management as evidenced by increasing shortness of breath with exertion and increase in lower extremity edema.  ?Unable to perform ADLs independently ?Unable to perform IADLs independently ?Financial strain-patient reporting her Eliquis copay has increased to $100 monthly.   ?Patient reporting being real busy with multiple doctor appointments.  Has Dermatology appointment for hematoma/sore on leg.  Reporting she had periods light headiness and dizziness feeling faint.  Blood pressures have been elevated.  Instructed to take Lasix for a week and to start on Toprol XL.  States yesterday was first day of taking Toprol.  BP 166/99 today.  Does state she feels better today than the past few days.  Denies shortness of breath or swelling in lower extremities.  Weight this morning 162. ?2/17--Reporting she does not feel well related to labile blood pressures.  States BP continues to range 160/90-100's.  Reports compliance with medications even Toprol.  Denies chest pains or increase in lower extremity edema; does  admit to shortness of breath with exesion and having periods of lightheadedness.  Leg wound biopsy + for skin CA per patient (awaiting plan).  Relieved that her house has been sold.  Weight this morning 162 pounds.  Continues to adjust to IL. ?3/17--did not sleep well last night but states she feels a little better.  Continues to have some shortness of breath and elevated blood pressures.  Does report not taking lasix due to "not having fluid in extremities".  Discussed heart failure and s/s of fluid  build up in lungs and encouraged patient to take lasix, weight 166 pounds, BP continues to be 160-170/90's ? ?Nurse Case Manager Clinical Goal(s):  ?Patient will report no hospitalization related to heart failure exacerbation within the next 90 days. ?patient will weigh self daily and record ?patient will verbalize understanding of Heart Failure Action Plan and when to call doctor ?patient will take all Heart Failure mediations as prescribed ?Patient will continue to monitor heart rates and notify provider if elevated heart rates sustains ? ? ?Heart Failure Interventions:   Goal on rack (progressing): NO ?Collaboration with Crecencio Mc, MD regarding development and update of comprehensive plan of care as evidenced by provider attestation and co-signature ?Inter-disciplinary  care team collaboration (see longitudinal plan of care) ?Basic overview and discussion of pathophysiology of Heart Failure ?Reviewed Heart Failure Action Plan in depth and provided written copy; encouraged patient to review ?Encouraged continued daily weight monitoring, documenting in log for provider review ?Reviewed medications and encouraged medication compliance;  ?Reviewed role of diuretics in prevention of fluid overload and dosage of Lasix (patient verbalized her understanding and states taking 1 tablets daily) ?Discussed increased heart rates and atrial fibrillation and encouraged patient to continue to monitor feelings of rapid rates  and to notify provider if rates sustains

## 2021-09-02 NOTE — Patient Instructions (Addendum)
Visit Information ? ?Thank you for taking time to visit with me today. Please don't hesitate to contact me if I can be of assistance to you before our next scheduled telephone appointment. ? ?Following are the goals we discussed today:  ?Call office if I gain more than 2 pounds in one day or 5 pounds in one week ?Keep legs up/elevated while sitting and wear support hose ?Watch for swelling in feet, ankles and legs every day ?Weigh myself daily and keep in log to take to provider for review ?Follow rescue plan and know when to call the doctor ?Continue to monitor blood pressures daily taking log to provider review/monitoring for light headiness ?Check blood pressures daily and notify providers for sustained elevations ?Try to stay calm and relax ?TAKE LASIX AS PRESCRIBED ? ?Our next appointment is by telephone on 4/10 at 0930 ? ?Please call the care guide team at [phone] if you need to cancel or reschedule your appointment.  ? ?If you are experiencing a Mental Health or [place] or need someone to talk to, please call the Suicide and Crisis Lifeline: 988 ?call the [place] National Suicide Prevention Lifeline: (647)693-0631 or TTY: (636)171-3704 TTY 623-226-7213) to talk to a trained counselor ?call 1-800-273-TALK (toll free, 24 hour hotline) ?call 911  ? ?Patient verbalizes understanding of instructions and care plan provided today and agrees to view in [place]. Active MyChart status confirmed with patient.   ? ?[name] RN, MSN ?RN Care Management Coordinator ?[place] ?[phone] ?[email or web address] ? ?

## 2021-09-12 ENCOUNTER — Ambulatory Visit: Payer: Medicare Other | Admitting: Dermatology

## 2021-09-15 ENCOUNTER — Encounter: Payer: Self-pay | Admitting: Dermatology

## 2021-09-15 ENCOUNTER — Ambulatory Visit (INDEPENDENT_AMBULATORY_CARE_PROVIDER_SITE_OTHER): Payer: Medicare Other | Admitting: Dermatology

## 2021-09-15 DIAGNOSIS — L578 Other skin changes due to chronic exposure to nonionizing radiation: Secondary | ICD-10-CM

## 2021-09-15 DIAGNOSIS — T8189XA Other complications of procedures, not elsewhere classified, initial encounter: Secondary | ICD-10-CM | POA: Diagnosis not present

## 2021-09-15 DIAGNOSIS — C44629 Squamous cell carcinoma of skin of left upper limb, including shoulder: Secondary | ICD-10-CM | POA: Diagnosis not present

## 2021-09-15 DIAGNOSIS — D492 Neoplasm of unspecified behavior of bone, soft tissue, and skin: Secondary | ICD-10-CM

## 2021-09-15 DIAGNOSIS — L080 Pyoderma: Secondary | ICD-10-CM

## 2021-09-15 DIAGNOSIS — L820 Inflamed seborrheic keratosis: Secondary | ICD-10-CM | POA: Diagnosis not present

## 2021-09-15 NOTE — Patient Instructions (Addendum)

## 2021-09-15 NOTE — Progress Notes (Signed)
? ?  Follow-Up Visit ?  ?Subjective  ?Linda [name] is a 88 y.o. female who presents for the following: Actinic Keratosis (8 week recheck. B/L dorsal hands. Treated with LN2 at last visit.  Recheck SCC at right lower pretibia lateral. Tx with EDC at last visit). ? ?The patient has spots, moles and lesions to be evaluated, some may be new or changing and the patient has concerns that these could be cancer. ? ? ?The following portions of the chart were reviewed this encounter and updated as appropriate:   ?  ? ?Review of Systems: No other skin or systemic complaints except as noted in HPI or Assessment and Plan. ? ? ?Objective  ?Well appearing patient in no apparent distress; mood and affect are within normal limits. ? ?A focused examination was performed including face, hands, right leg. Relevant physical exam findings are noted in the Assessment and Plan. ? ?Left Dorsal Hand ?2 x 1 cm firm keratotic nodule  ? ? ? ? ?Left hand at 2nd webspace ?Erythematous keratotic or waxy stuck-on papule ? ?Right Lower Leg - Anterior ?72m full thickness ulcerated surgical wound with yellow oozing at base ? ? ?Assessment & Plan  ? ? ? ?Neoplasm of skin ?Left Dorsal Hand ? ?Epidermal / dermal shaving ? ?Lesion diameter (cm):  2 ?Informed consent: discussed and consent obtained   ?Patient was prepped and draped in usual sterile fashion: Area prepped with alcohol. ?Anesthesia: the lesion was anesthetized in a standard fashion   ?Anesthetic:  1% lidocaine w/ epinephrine 1-100,000 buffered w/ 8.4% NaHCO3 ?Instrument used: flexible razor blade   ?Hemostasis achieved with: pressure, aluminum chloride and electrodesiccation   ?Outcome: patient tolerated procedure well   ?Post-procedure details: wound care instructions given   ?Post-procedure details comment:  Ointment and small bandage applied ?Additional details:  Shaved down to fat/muscle. Appears clear at base. No Edc performed today, will monitor site for recurrence ? ?Specimen 1 -  Surgical pathology ?Differential Diagnosis: Hypertrophic AK vs prurigo nodule, R/O SCC ? ?Check Margins: No ?Shaved down to fat. Appears clear at base. ? ?Inflamed seborrheic keratosis ?Left hand at 2nd webspace ? ?Reassured benign age-related growth.  Recommend observation.  Discussed cryotherapy if spot(s) become irritated or inflamed. Pt defers at this time ? ?Okay to continue 1% HC cream 2-3 times daily as needed for itch. ? ?Pyoderma ? ?Related Procedures ?Anaerobic and Aerobic Culture ? ?Delayed surgical wound healing, initial encounter ?Right Lower Leg - Anterior ? ?[facility] right lower pretibia, with possible secondary infection ? ?Bacterial culture today ?Continue current wound care regimen, cleanse with soap and water, apply neosporin and cover daily ? ?Anaerobic and Aerobic Culture - Right Lower Leg - Anterior ? ?Actinic Damage ?- chronic, secondary to cumulative UV radiation exposure/sun exposure over time ?- diffuse scaly erythematous macules with underlying dyspigmentation ?- Recommend daily broad spectrum sunscreen SPF 30+ to sun-exposed areas, reapply every 2 hours as needed.  ?- Recommend staying in the shade or wearing long sleeves, sun glasses (UVA+UVB protection) and wide brim hats (4-inch brim around the entire circumference of the hat). ?- Call for new or changing lesions.  ? ?Return in about 6 weeks (around 10/27/2021) for Biopsy Follow Up, and leg wound check. ? ?I, JEmelia Salisbury CMA, am acting as scribe for TBrendolyn Patty MD. ? ?Documentation: I have reviewed the above documentation for accuracy and completeness, and I agree with the above. ? ?TBrendolyn PattyMD  ?

## 2021-09-16 DIAGNOSIS — I110 Hypertensive heart disease with heart failure: Secondary | ICD-10-CM

## 2021-09-16 DIAGNOSIS — I4891 Unspecified atrial fibrillation: Secondary | ICD-10-CM

## 2021-09-19 ENCOUNTER — Telehealth: Payer: Self-pay

## 2021-09-19 ENCOUNTER — Ambulatory Visit (INDEPENDENT_AMBULATORY_CARE_PROVIDER_SITE_OTHER): Payer: Medicare Other | Admitting: Dermatology

## 2021-09-19 DIAGNOSIS — C44629 Squamous cell carcinoma of skin of left upper limb, including shoulder: Secondary | ICD-10-CM | POA: Diagnosis not present

## 2021-09-19 MED ORDER — MUPIROCIN 2 % EX OINT: 1.0000 "application " | TOPICAL_OINTMENT | Freq: Every day | CUTANEOUS | 0 refills | Status: DC

## 2021-09-19 MED ORDER — DOXYCYCLINE MONOHYDRATE 100 MG PO CAPS: 100.0000 mg | ORAL_CAPSULE | Freq: Two times a day (BID) | ORAL | 0 refills | Status: AC

## 2021-09-19 NOTE — Progress Notes (Signed)
? ?  Follow-Up Visit ?  ?Subjective  ?Linda [name] is a 88 y.o. female who presents for the following: check bx site (L dorsal hand, pt felt like it may be infected). Painful and red. Daughter is concerned. ? ? ?The following portions of the chart were reviewed this encounter and updated as appropriate:  ?  ?  ? ?Review of Systems:  No other skin or systemic complaints except as noted in HPI or Assessment and Plan. ? ?Objective  ?Well appearing patient in no apparent distress; mood and affect are within normal limits. ? ?A focused examination was performed including left dorsal hand. Relevant physical exam findings are noted in the Assessment and Plan. ? ?Left Hand - Posterior ?Healing wound with surrounding mild erythema 1.5cm, patient c/o pain ? ? ? ?Assessment & Plan  ?Squamous cell carcinoma of skin of left upper limb, including shoulder ?Left Hand - Posterior ? ?doxycycline (MONODOX) 100 MG capsule ?Take 1 capsule (100 mg total) by mouth 2 (two) times daily for 7 days. Take with food and drink ? ?mupirocin ointment (BACTROBAN) 2 % ?Apply 1 application. topically daily. Qd to wound on hand until healed ? ?Healing bx site with early wound infection ? ?Start Doxycyline '100mg'$  1 po bid with food and drink ?Start Mupirocin oint qd to wound until heal ? ?Wound cleansed with Puracyn, then mupirocin applied followed by telfa and coban ? ?Doxycycline should be taken with food to prevent nausea. Do not lay down for 30 minutes after taking. Be cautious with sun exposure and use good sun protection while on this medication. Pregnant women should not take this medication.   ? ? ?Return for as scheduled. ? ?Faxed over patients After visit summary to [facility] per her request at [phone]. ? ?I, Othelia Pulling, RMA, am acting as scribe for Brendolyn Patty, MD . ? ?Documentation: I have reviewed the above documentation for accuracy and completeness, and I agree with the above. ? ?Brendolyn Patty MD  ? ? ? ?

## 2021-09-19 NOTE — Telephone Encounter (Signed)
Pt called to let Dr Chauncey Cruel know her biopsy site is swollen and red, pt think this area should be checked.  ? ?Pt will return to the office today  ?

## 2021-09-19 NOTE — Patient Instructions (Addendum)
SQUAMOUS CELL CARCINOMA of the left dorsal hand healing well ? ?Start Doxycycline '100mg'$  1 pill by mouth twice a day with food and drink ?Start Mupirocin ointment daily to wound.  Clean wound with soap and water and then apply the mupirocin ointment once daily. ? ?Wound was cleansed with Puracyn, then mupirocin ointment applied and covered with nonstick telfa and coban wrap. ? ? ? ?If You Need Anything After Your Visit ? ?If you have any questions or concerns for your doctor, please call our main line at [phone] and press option 4 to reach your doctor's medical assistant. If no one answers, please leave a voicemail as directed and we will return your call as soon as possible. Messages left after 4 pm will be answered the following business day.  ? ?You may also send [place] a message via MyChart. We typically respond to MyChart messages within 1-2 business days. ? ?For prescription refills, please ask your pharmacy to contact our office. Our fax number is [phone]. ? ?If you have an urgent issue when the clinic is closed that cannot wait until the next business day, you can page your doctor at the number below.   ? ?Please note that while we do our best to be available for urgent issues outside of office hours, we are not available 24/7.  ? ?If you have an urgent issue and are unable to reach [place], you may choose to seek medical care at your doctor's office, retail clinic, urgent care center, or emergency room. ? ?If you have a medical emergency, please immediately call 911 or go to the emergency department. ? ?Pager Numbers ? ?- Dr. Nehemiah Massed: [phone] ? ?- Dr. Laurence Ferrari: [phone] ? ?- Dr. Nicole Kindred: [phone] ? ?In the event of inclement weather, please call our main line at [phone] for an update on the status of any delays or closures. ? ?Dermatology Medication Tips: ?Please keep the boxes that topical medications come in in order to help keep track of the instructions about where and how to use these.  Pharmacies typically print the medication instructions only on the boxes and not directly on the medication tubes.  ? ?If your medication is too expensive, please contact our office at [phone] option 4 or send [place] a message through [place].  ? ?We are unable to tell what your co-pay for medications will be in advance as this is different depending on your insurance coverage. However, we may be able to find a substitute medication at lower cost or fill out paperwork to get insurance to cover a needed medication.  ? ?If a prior authorization is required to get your medication covered by your insurance company, please allow [place] 1-2 business days to complete this process. ? ?Drug prices often vary depending on where the prescription is filled and some pharmacies may offer cheaper prices. ? ?The website www.goodrx.com contains coupons for medications through different pharmacies. The prices here do not account for what the cost may be with help from insurance (it may be cheaper with your insurance), but the website can give you the price if you did not use any insurance.  ?- You can print the associated coupon and take it with your prescription to the pharmacy.  ?- You may also stop by our office during regular business hours and pick up a GoodRx coupon card.  ?- If you need your prescription sent electronically to a different pharmacy, notify our office through [facility] or by phone at [phone] option 4. ? ? ? ? ?  Si Usted Necesita Algo Despu?s de Su Visita ? ?Tambi?n puede enviarnos un mensaje a trav?s de MyChart. Por lo general respondemos a los mensajes de MyChart en el transcurso de 1 a 2 d?as h?biles. ? ?Para renovar recetas, por favor pida a su farmacia que se ponga en contacto con nuestra oficina. Nuestro n?mero de fax es el [phone]. ? ?Si tiene un asunto urgente cuando la cl?nica est? cerrada y que no puede esperar hasta el siguiente d?a h?bil, puede llamar/localizar a su doctor(a) al n?mero  que aparece a continuaci?n.  ? ?Por favor, tenga en cuenta que aunque hacemos todo lo posible para estar disponibles para asuntos urgentes fuera del horario de oficina, no estamos disponibles las 24 horas del d?a, los 7 d?as de la semana.  ? ?Si tiene un problema urgente y no puede comunicarse con nosotros, puede optar por buscar atenci?n m?dica  en el consultorio de su doctor(a), en una cl?nica privada, en un centro de atenci?n urgente o en una sala de emergencias. ? ?Si tiene Engineer, maintenance (IT) m?dica, por favor llame inmediatamente al 911 o vaya a la sala de emergencias. ? ?N?meros de b?per ? ?- Dr. Nehemiah Massed: [phone] ? ?- Dra. Moye: [phone] ? ?- Dra. Nicole Kindred: [phone] ? ?En caso de inclemencias del tiempo, por favor llame a nuestra l?nea principal al [phone] para una actualizaci?n sobre el estado de cualquier retraso o cierre. ? ?Consejos para la medicaci?n en dermatolog?a: ?Por favor, guarde las cajas en las que vienen los medicamentos de uso t?pico para ayudarle a seguir las instrucciones sobre d?nde y c?mo usarlos. Las farmacias generalmente imprimen las instrucciones del medicamento s?lo en las cajas y no directamente en los tubos del [place].  ? ?Si su medicamento es muy caro, por favor, p?ngase en contacto con Zigmund Daniel llamando al [phone] y presione la opci?n 4 o env?enos un mensaje a trav?s de MyChart.  ? ?No podemos decirle cu?l ser? su copago por los medicamentos por adelantado ya que esto es diferente dependiendo de la cobertura de su seguro. Sin embargo, es posible que podamos encontrar un medicamento sustituto a Electrical engineer un formulario para que el seguro cubra el medicamento que se considera necesario.  ? ?Si se requiere Ardelia Mems autorizaci?n previa para que su compa??a de seguros [place] su medicamento, por favor perm?tanos de 1 a 2 d?as h?biles para completar este proceso. ? ?Los precios de los medicamentos var?an con frecuencia dependiendo del Environmental consultant de d?nde se  surte la receta y alguna farmacias pueden ofrecer precios m?s baratos. ? ?El sitio web www.goodrx.com tiene cupones para medicamentos de Airline pilot. Los precios aqu? no tienen en cuenta lo que podr?a costar con la ayuda del seguro (puede ser m?s barato con su seguro), pero el sitio web puede darle el precio si no utiliz? ning?n seguro.  ?- Puede imprimir el cup?n correspondiente y llevarlo con su receta a la farmacia.  ?- Tambi?n puede pasar por nuestra oficina durante el horario de atenci?n regular y recoger una tarjeta de cupones de GoodRx.  ?- Si necesita que su receta se env?e electr?nicamente a Chiropodist, informe a nuestra oficina a trav?s de MyChart de [place] o por tel?fono llamando al [phone] y presione la opci?n 4.  ?

## 2021-09-19 NOTE — Telephone Encounter (Signed)
-----   Message from Brendolyn Patty, MD sent at 09/19/2021 10:37 AM EDT ----- ?Skin , left dorsal hand ?WELL DIFFERENTIATED SQUAMOUS CELL CARCINOMA ? ?SCC skin cancer, shaved down to fat at time of biopsy so should already be removed.  Will observe for recurrence, recheck next appt. ? ? - please call patient ?

## 2021-09-19 NOTE — Telephone Encounter (Signed)
Biopsy results discussed with pt  

## 2021-09-19 NOTE — Telephone Encounter (Signed)
Left message for patient to call for biopsy results. 

## 2021-09-22 LAB — ANAEROBIC AND AEROBIC CULTURE

## 2021-09-26 ENCOUNTER — Telehealth: Payer: Medicare Other

## 2021-09-28 ENCOUNTER — Telehealth: Payer: Self-pay

## 2021-09-28 NOTE — Telephone Encounter (Signed)
Spoke to patient and advised of culture results.  Advised pt she was on antibiotic to treat the staph infection.  Pt does think has improved.  She will have her daughter look at the would this weekend and if daughter thinks not improving pt will call us.  Advised pt to finish the Doxycycline as prescribed and to continue using the Mupirocin oint qd to wound./sh ?

## 2021-09-28 NOTE — Telephone Encounter (Signed)
Left pt msg to call for culture results/sh ?

## 2021-09-28 NOTE — Telephone Encounter (Signed)
-----   Message from Brendolyn Patty, MD sent at 09/28/2021 10:47 AM EDT ----- ?Culture positive for staph (sensitive to TCN), she has Rx for Doxycycline, confirm that she is improving on treatment. Continue Mupirocin ointment bid until healed ?- please call patient ?

## 2021-10-05 DIAGNOSIS — Z95818 Presence of other cardiac implants and grafts: Secondary | ICD-10-CM | POA: Insufficient documentation

## 2021-10-19 ENCOUNTER — Ambulatory Visit (INDEPENDENT_AMBULATORY_CARE_PROVIDER_SITE_OTHER): Payer: Medicare Other | Admitting: Internal Medicine

## 2021-10-19 ENCOUNTER — Encounter: Payer: Self-pay | Admitting: Internal Medicine

## 2021-10-19 VITALS — BP 136/82 | HR 57 | Ht 63.5 in | Wt 167.6 lb

## 2021-10-19 DIAGNOSIS — E1129 Type 2 diabetes mellitus with other diabetic kidney complication: Secondary | ICD-10-CM | POA: Diagnosis not present

## 2021-10-19 DIAGNOSIS — E782 Mixed hyperlipidemia: Secondary | ICD-10-CM

## 2021-10-19 DIAGNOSIS — R809 Proteinuria, unspecified: Secondary | ICD-10-CM

## 2021-10-19 DIAGNOSIS — I059 Rheumatic mitral valve disease, unspecified: Secondary | ICD-10-CM | POA: Diagnosis not present

## 2021-10-19 DIAGNOSIS — I10 Essential (primary) hypertension: Secondary | ICD-10-CM | POA: Diagnosis not present

## 2021-10-19 DIAGNOSIS — T502X5A Adverse effect of carbonic-anhydrase inhibitors, benzothiadiazides and other diuretics, initial encounter: Secondary | ICD-10-CM

## 2021-10-19 DIAGNOSIS — I428 Other cardiomyopathies: Secondary | ICD-10-CM

## 2021-10-19 DIAGNOSIS — E876 Hypokalemia: Secondary | ICD-10-CM

## 2021-10-19 LAB — COMPREHENSIVE METABOLIC PANEL
ALT: 24 U/L (ref 0–35)
AST: 24 U/L (ref 0–37)
Albumin: 4.3 g/dL (ref 3.5–5.2)
Alkaline Phosphatase: 91 U/L (ref 39–117)
BUN: 19 mg/dL (ref 6–23)
CO2: 34 mEq/L — ABNORMAL HIGH (ref 19–32)
Calcium: 9.5 mg/dL (ref 8.4–10.5)
Chloride: 99 mEq/L (ref 96–112)
Creatinine, Ser: 0.8 mg/dL (ref 0.40–1.20)
GFR: 66.94 mL/min (ref >60.00)
Glucose, Bld: 92 mg/dL (ref 70–99)
Potassium: 3.3 mEq/L — ABNORMAL LOW (ref 3.5–5.1)
Sodium: 140 mEq/L (ref 135–145)
Total Bilirubin: 1.7 mg/dL — ABNORMAL HIGH (ref 0.2–1.2)
Total Protein: 6.9 g/dL (ref 6.0–8.3)

## 2021-10-19 LAB — LIPID PANEL
Cholesterol: 163 mg/dL (ref 0–200)
HDL: 66.4 mg/dL (ref >39.00)
LDL Cholesterol: 84 mg/dL (ref 0–99)
NonHDL: 96.98
Total CHOL/HDL Ratio: 2
Triglycerides: 67 mg/dL (ref 0.0–149.0)
VLDL: 13.4 mg/dL (ref 0.0–40.0)

## 2021-10-19 LAB — LDL CHOLESTEROL, DIRECT: Direct LDL: 90 mg/dL

## 2021-10-19 LAB — HEMOGLOBIN A1C: Hgb A1c MFr Bld: 7 % — ABNORMAL HIGH (ref 4.6–6.5)

## 2021-10-19 MED ORDER — TETANUS-DIPHTH-ACELL PERTUSSIS 5-2.5-18.5 LF-MCG/0.5 IM SUSY: 0.5000 mL | PREFILLED_SYRINGE | Freq: Once | INTRAMUSCULAR | 0 refills | Status: AC

## 2021-10-19 MED ORDER — ZOSTER VAC RECOMB ADJUVANTED 50 MCG/0.5ML IM SUSR: 0.5000 mL | Freq: Once | INTRAMUSCULAR | 1 refills | Status: AC

## 2021-10-19 NOTE — Progress Notes (Signed)
? ?Subjective:  ?Patient ID: Linda [name], female    DOB: 1934-03-19  Age: 88 y.o. MRN: [id number] ? ?CC: The primary encounter diagnosis was Essential hypertension. Diagnoses of Controlled type 2 diabetes mellitus with microalbuminuria, without long-term current use of insulin ([place]), Mixed hyperlipidemia, Mitral valve disease, Valvular cardiomyopathy ([place]), and Diuretic-induced hypokalemia were also pertinent to this visit. ? ? ?This visit occurred during the SARS-CoV-2 public health emergency.  Safety protocols were in place, including screening questions prior to the visit, additional usage of staff PPE, and extensive cleaning of exam room while observing appropriate contact time as indicated for disinfecting solutions.   ? ?HPI ?Linda [name] presents for follow up on chronic issues.  ?Chief Complaint  ?Patient presents with  ? Follow-up  ?  3 month follow up on diabetes  ? ?1) valvular cardiomyopathy :  she  was referred back to [facility] after ECHO showed  severe MR and TR with pulmonary hypertension .  She is now taking lasix  daily,  and her weight has been stable.   Not adding salt .  Life has become sedentary.  She does not anticipate valve replacement  ? ?2) T2DM:  following a moderate low glycemic index diet .  No medications for diabetes.  Not exercising due to her cardiomyopathy.  Denies neuropathy, checks sugars infrequently unless she is having symptoms of low blood sugars,  which is rare. ? ? ?Outpatient Medications Prior to Visit  ?Medication Sig Dispense Refill  ? acetaminophen (TYLENOL) 500 MG tablet Take 500 mg by mouth every 6 (six) hours as needed for moderate pain or headache.    ? cetirizine (ZYRTEC) 10 MG tablet Take 10 mg by mouth daily as needed for allergies.    ? Cholecalciferol (VITAMIN D) 2000 units tablet Take 2,000 Units by mouth daily.    ? ELIQUIS 5 MG TABS tablet Take 1 tablet (5 mg total) by mouth 2 (two) times daily. 60 tablet 0  ? estradiol (ESTRACE) 0.1 MG/GM vaginal cream  Place 1 g vaginally 3 (three) times a week. Pea sized amount per urethra 42.5 g 12  ? furosemide (LASIX) 20 MG tablet Take 40 mg by mouth daily.    ? losartan (COZAAR) 25 MG tablet Take 1 tablet (25 mg total) by mouth daily. 90 tablet 1  ? metoprolol succinate (TOPROL-XL) 25 MG 24 hr tablet Take 12.5 mg by mouth daily.    ? mupirocin ointment (BACTROBAN) 2 % Apply 1 application. topically daily. Qd to wound on hand until healed 22 g 0  ? traZODone (DESYREL) 50 MG tablet Take 50 mg by mouth at bedtime.    ? Ketotifen Fumarate (ALLERGY EYE DROPS OP) Place 1 drop into both eyes daily as needed (allergies). (Patient not taking: Reported on 10/19/2021)    ? LORazepam (ATIVAN) 0.5 MG tablet Take 1 tablet (0.5 mg total) by mouth at bedtime as needed for anxiety. (Patient not taking: Reported on 10/19/2021) 30 tablet 0  ? ?No facility-administered medications prior to visit.  ? ? ?Review of Systems; ? ?Patient denies headache, fevers, malaise, unintentional weight loss, skin rash, eye pain, sinus congestion and sinus pain, sore throat, dysphagia,  hemoptysis , cough, dyspnea, wheezing, chest pain, palpitations, orthopnea, edema, abdominal pain, nausea, melena, diarrhea, constipation, flank pain, dysuria, hematuria, urinary  Frequency, nocturia, numbness, tingling, seizures,  Focal weakness, Loss of consciousness,  Tremor, insomnia, depression, anxiety, and suicidal ideation.   ? ? ? ?Objective:  ?BP 136/82 (BP Location: Left Arm,  Patient Position: Sitting, Cuff Size: Normal)   Pulse (!) 57   Ht 5' 3.5" (1.613 m)   Wt 167 lb 9.6 oz (76 kg)   SpO2 94%   BMI 29.22 kg/m?  ? ?BP Readings from Last 3 Encounters:  ?10/19/21 136/82  ?07/21/21 (!) 156/84  ?07/13/21 140/80  ? ? ?Wt Readings from Last 3 Encounters:  ?10/19/21 167 lb 9.6 oz (76 kg)  ?07/21/21 167 lb (75.8 kg)  ?07/13/21 166 lb 12.8 oz (75.7 kg)  ? ? ?General appearance: alert, cooperative and appears stated age ?Ears: normal TM's and external ear canals both  ears ?Throat: lips, mucosa, and tongue normal; teeth and gums normal ?Neck: no adenopathy, no carotid bruit, supple, symmetrical, trachea midline and thyroid not enlarged, symmetric, no tenderness/mass/nodules ?Back: symmetric, no curvature. ROM normal. No CVA tenderness. ?Lungs: clear to auscultation bilaterally ?Heart: regular rate and rhythm, S1, S2 normal, no murmur, click, rub or gallop ?Abdomen: soft, non-tender; bowel sounds normal; no masses,  no organomegaly ?Pulses: 2+ and symmetric ?Skin: Skin color, texture, turgor normal. No rashes or lesions ?Lymph nodes: Cervical, supraclavicular, and axillary nodes normal. ? ?Lab Results  ?Component Value Date  ? HGBA1C 7.0 (H) 10/19/2021  ? HGBA1C 6.1 (A) 07/21/2021  ? HGBA1C 7.2 (H) 09/09/2020  ? ? ?Lab Results  ?Component Value Date  ? CREATININE 0.80 10/19/2021  ? CREATININE 0.79 07/21/2021  ? CREATININE 0.84 07/06/2021  ? ? ?Lab Results  ?Component Value Date  ? WBC 5.5 07/06/2021  ? HGB 14.9 07/06/2021  ? HCT 44.7 07/06/2021  ? PLT 164.0 07/06/2021  ? GLUCOSE 92 10/19/2021  ? CHOL 163 10/19/2021  ? TRIG 67.0 10/19/2021  ? HDL 66.40 10/19/2021  ? LDLDIRECT 90.0 10/19/2021  ? [place] 84 10/19/2021  ? ALT 24 10/19/2021  ? AST 24 10/19/2021  ? NA 140 10/19/2021  ? K 3.3 (L) 10/19/2021  ? CL 99 10/19/2021  ? CREATININE 0.80 10/19/2021  ? BUN 19 10/19/2021  ? CO2 34 (H) 10/19/2021  ? TSH 1.91 05/27/2020  ? INR 1.78 05/11/2018  ? HGBA1C 7.0 (H) 10/19/2021  ? MICROALBUR 7.5 (H) 07/21/2021  ? ? ?US Venous Img Lower Unilateral Right (DVT) ? ?Result Date: 07/11/2021 ?CLINICAL DATA:  Swelling and pain of right lower leg.  Injury. EXAM: RIGHT LOWER EXTREMITY VENOUS DOPPLER ULTRASOUND TECHNIQUE: Gray-scale sonography with compression, as well as color and duplex ultrasound, were performed to evaluate the deep venous system(s) from the level of the common femoral vein through the popliteal and proximal calf veins. COMPARISON:  None. FINDINGS: VENOUS Normal compressibility of  the common femoral, superficial femoral, and popliteal veins, as well as the visualized calf veins. Visualized portions of profunda femoral vein and great saphenous vein unremarkable. No filling defects to suggest DVT on grayscale or color Doppler imaging. Doppler waveforms show normal direction of venous flow, normal respiratory plasticity and response to augmentation. Limited views of the contralateral common femoral vein are unremarkable. OTHER A simple fluid collection is seen within the right popliteal fossa measuring 4.7 x 1.0 x 2.2 cm. Additional more heterogeneous avascular fluid collection seen within the anterior right lower leg in the area of the patient's injury measuring 5.1 x 0.8 x 2.5 cm. Limitations: none IMPRESSION:: IMPRESSION: 1. No deep venous thrombosis is visualized. 2. Probable Baker's cyst measuring up to 4.7 cm. 3. In the area of the patient's anterior right lower leg injury, there is more heterogeneous fluid collection, presumably a combination of soft tissue swelling, edema, and/or hematoma.  Electronically Signed   By: Yvonne Kendall M.D.   On: 07/11/2021 11:58  ? ? ?Assessment & Plan:  ? ?Problem List Items Addressed This Visit   ? ? Controlled type 2 diabetes mellitus with microalbuminuria, without long-term current use of insulin ([place])  ?  reamins at goal  without  medications . But A1c has risen .  Will discuss addition of jardiance at next visit .Patient is reminded to schedule an annual eye exam and foot exam is normal today. Patient has no microalbuminuria. Patient  Has a history of statin induced myalgias.  Continue  ACE/ARB for renal protection and hypertension  ? ?Lab Results  ?Component Value Date  ? HGBA1C 7.0 (H) 10/19/2021  ? ?Lab Results  ?Component Value Date  ? LABMICR See below: 11/15/2017  ? LABMICR See below: 06/26/2017  ? MICROALBUR 7.5 (H) 07/21/2021  ? MICROALBUR 4.9 (H) 09/10/2020  ? ? ? ?  ?  ? Relevant Orders  ? HgB A1c (Completed)  ? Comp Met (CMET) (Completed)  ?  Diuretic-induced hypokalemia  ?  Adding potassium supplement ? ?  ?  ? Essential hypertension - Primary  ? Relevant Orders  ? Comp Met (CMET) (Completed)  ? Hyperlipidemia  ? Relevant Orders  ? Lipid Profile (Complet

## 2021-10-19 NOTE — Patient Instructions (Addendum)
You have two leaky valves,  (mitral and tricuspid) and that's why the fluid pill has been prescribed  ? ?You can use Restasis or Systane for dry eye  ? ?Keep walking daily to keep your muscles strong and your lungs conditioned  ? ? ?The Tdap (tetanus-diphtheria-whooping cough vaccine ) and Shingrix vaccines are now  COVERED BY MEDICARE if you get them at your pharmacy as of  January 1.  ? ?You can expect 24 hours of flu like symptoms after receiving the shingles vaccine, so plan accordingly.    ? ? ? ? ? ? ?

## 2021-10-19 NOTE — Assessment & Plan Note (Signed)
S/p mital clip last June. Was referred to [facility] , Nonrheumatic mitral (valve) insufficiency, MVP. A diagnosis of S/P mitral valve clip implantation x 2, 6/22 for severe primary MR was also pertinent to this visit. ? ?1) Bileaflet MVP, severe MR s/p TEER x 2 6/22 ? ?NYHA 2. I have reviewed recent outside TTE 09/07/21. LVEF 40% but with increase in LV dimensions (EDD 5.7, ESD 4.6 cm); stable TEER x 2; mean MVG 3 mm Hg; moderate MR (my review, no EROA calculated); moderate-severe TR with RVSP 85 mm Hg. ? ?There has been increase in lateral MR jet since 8/22. I do not see any residual flail segment. This increase may be due to progressive degeneration vs. LV dilation. Her symptoms are relatively mild and stable at this time so no intervention is needed. If her symptoms progress, consideration could be given to redo TEER of the lateral MR jet.  ? ?

## 2021-10-22 DIAGNOSIS — I428 Other cardiomyopathies: Secondary | ICD-10-CM | POA: Insufficient documentation

## 2021-10-22 MED ORDER — POTASSIUM CHLORIDE CRYS ER 20 MEQ PO TBCR: 20.0000 meq | EXTENDED_RELEASE_TABLET | Freq: Every day | ORAL | 3 refills | Status: DC

## 2021-10-22 NOTE — Assessment & Plan Note (Addendum)
reamins at goal  without  medications . But A1c has risen .  Will discuss addition of jardiance at next visit .Patient is reminded to schedule an annual eye exam and foot exam is normal today. Patient has no microalbuminuria. Patient  Has a history of statin induced myalgias.  Continue  ACE/ARB for renal protection and hypertension  ? ?Lab Results  ?Component Value Date  ? HGBA1C 7.0 (H) 10/19/2021  ? ?Lab Results  ?Component Value Date  ? LABMICR See below: 11/15/2017  ? LABMICR See below: 06/26/2017  ? MICROALBUR 7.5 (H) 07/21/2021  ? MICROALBUR 4.9 (H) 09/10/2020  ? ? ? ?

## 2021-10-22 NOTE — Assessment & Plan Note (Signed)
Adding potassium supplement ?

## 2021-10-22 NOTE — Assessment & Plan Note (Signed)
She has severe MR and TR , with pulmonary hypertension .  She has been prescribed furosemide .  She is encouarged to walk daily to maintain conditioning  ?

## 2021-10-24 ENCOUNTER — Ambulatory Visit (INDEPENDENT_AMBULATORY_CARE_PROVIDER_SITE_OTHER): Payer: Medicare Other | Admitting: *Deleted

## 2021-10-24 DIAGNOSIS — I10 Essential (primary) hypertension: Secondary | ICD-10-CM

## 2021-10-24 DIAGNOSIS — I428 Other cardiomyopathies: Secondary | ICD-10-CM

## 2021-10-24 NOTE — Chronic Care Management (AMB) (Addendum)
?Chronic Care Management  ? ?CCM RN Visit Note ? ?10/24/2021 ?Name: Linda [name] MRN: [id number] DOB: 1934/05/21 ? ?Subjective: ?Linda [name] is a 88 y.o. year old female who is a primary care patient of Tullo, Aris Everts, MD. The care management team was consulted for assistance with disease management and care coordination needs.   ? ?Engaged with patient by telephone for follow up visit in response to provider referral for case management and/or care coordination services.  ? ?Consent to Services:  ?The patient was given information about Chronic Care Management services, agreed to services, and gave verbal consent prior to initiation of services.  Please see initial visit note for detailed documentation.  ? ?Patient agreed to services and verbal consent obtained.  ? ?Assessment: Review of patient past medical history, allergies, medications, health status, including review of consultants reports, laboratory and other test data, was performed as part of comprehensive evaluation and provision of chronic care management services.  ? ?SDOH (Social Determinants of Health) assessments and interventions performed:   ? ?CCM Care Plan ? ?Allergies  ?Allergen Reactions  ? Atorvastatin Other (See Comments)  ?  Myalgias ?  ? Barium Iodide   ?  Unknown  ? Statins Other (See Comments)  ?  Myalgias ?  ? Crestor [Rosuvastatin Calcium] Other (See Comments)  ?  Myalgia ?  ? Penicillins Rash  ? Sulfa Antibiotics Rash  ? Sulfonamide Derivatives Rash  ? ? ?Outpatient Encounter Medications as of 10/24/2021  ?Medication Sig Note  ? furosemide (LASIX) 20 MG tablet Take 40 mg by mouth daily. 10/24/2021: REPORTS TAKING DAILY  ? acetaminophen (TYLENOL) 500 MG tablet Take 500 mg by mouth every 6 (six) hours as needed for moderate pain or headache.   ? cetirizine (ZYRTEC) 10 MG tablet Take 10 mg by mouth daily as needed for allergies.   ? Cholecalciferol (VITAMIN D) 2000 units tablet Take 2,000 Units by mouth daily.   ? ELIQUIS 5 MG TABS tablet  Take 1 tablet (5 mg total) by mouth 2 (two) times daily.   ? estradiol (ESTRACE) 0.1 MG/GM vaginal cream Place 1 g vaginally 3 (three) times a week. Pea sized amount per urethra   ? losartan (COZAAR) 25 MG tablet Take 1 tablet (25 mg total) by mouth daily.   ? metoprolol succinate (TOPROL-XL) 25 MG 24 hr tablet Take 12.5 mg by mouth daily.   ? mupirocin ointment (BACTROBAN) 2 % Apply 1 application. topically daily. Qd to wound on hand until healed   ? potassium chloride SA (KLOR-CON M) 20 MEQ tablet Take 1 tablet (20 mEq total) by mouth daily.   ? traZODone (DESYREL) 50 MG tablet Take 50 mg by mouth at bedtime.   ? ?No facility-administered encounter medications on file as of 10/24/2021.  ? ? ?Patient Active Problem List  ? Diagnosis Date Noted  ? Valvular cardiomyopathy ([place]) 10/22/2021  ? Varicose veins of both lower extremities 07/13/2021  ? Injury of right leg 07/06/2021  ? Right leg pain 07/06/2021  ? Callus of foot 07/06/2021  ? Proximal muscle weakness 08/24/2020  ? Mitral valve disease 07/09/2020  ? Fatigue 05/29/2020  ? Diuretic-induced hypokalemia 12/16/2019  ? Myalgia due to statin 12/15/2019  ? Acquired thrombophilia ([place]) 12/15/2019  ? Skin lesion 08/27/2018  ? Hospital discharge follow-up 05/25/2018  ? History of CVA (cerebrovascular accident) without residual deficits 05/10/2018  ? Parent-child estrangement Delma Post 02/09/2018  ? Basal cell carcinoma (BCC) of right lower extremity 08/08/2017  ? Hematuria, gross 08/08/2017  ?  Extremity atherosclerosis with intermittent claudication ([place]) 08/08/2017  ? Generalized anxiety disorder 01/31/2017  ? 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 Controlled type 2 diabetes mellitus with microalbuminuria, without long-term current use of insulin ([place]) 01/20/2016  ? Insomnia secondary to anxiety 10/09/2015  ? Polycythemia, secondary 07/04/2015  ? Pre-syncope 07/02/2015  ? Osteopenia 08/21/2014  ? Gastritis 05/26/2014  ? GERD (gastroesophageal reflux disease) 03/04/2014  ?  Medicare annual wellness visit, subsequent 01/13/2014  ? Essential hypertension 11/04/2013  ? Screening for breast cancer 07/01/2013  ? Left shoulder pain 03/21/2013  ? Hoarseness of voice 03/21/2013  ? Edema 09/05/2010  ? VENTRICULAR HYPERTROPHY, LEFT 08/16/2010  ? Hyperlipidemia 06/03/2010  ? ATRIAL FIBRILLATION 08/13/2009  ? ? ?Conditions to be addressed/monitored:Atrial Fibrillation, CHF, HTN, and DMII ? ?Care Plan : Heart Failure  ?Updates made by Leona Singleton, RN since 10/24/2021 12:00 AM  ?  ? ?Problem: Heart Failure (increasing edema and shortness of breath   ?Priority: Medium  ?  ? ?Long-Range Goal: Symptom Exacerbation Prevented or Minimized   ?Start Date: 08/25/2020  ?Expected End Date: 06/17/2021  ?Recent Progress: Not on track  ?Priority: Medium  ?Note:   ?Current Barriers:   ?Knowledge deficits related to basic heart failure pathophysiology and self care management as evidenced by increasing shortness of breath with exertion and increase in lower extremity edema.  ?Unable to perform ADLs independently ?Unable to perform IADLs independently ?Financial strain-patient reporting her Eliquis copay has increased to $100 monthly.   ?Patient reporting being real busy with multiple doctor appointments.  Has Dermatology appointment for hematoma/sore on leg.  Reporting she had periods light headiness and dizziness feeling faint.  Blood pressures have been elevated.  Instructed to take Lasix for a week and to start on Toprol XL.  States yesterday was first day of taking Toprol.  BP 166/99 today.  Does state she feels better today than the past few days.  Denies shortness of breath or swelling in lower extremities.  Weight this morning 162. ?2/17--Reporting she does not feel well related to labile blood pressures.  States BP continues to range 160/90-100's.  Reports compliance with medications even Toprol.  Denies chest pains or increase in lower extremity edema; does admit to shortness of breath with exesion and  having periods of lightheadedness.  Leg wound biopsy + for skin CA per patient (awaiting plan).  Relieved that her house has been sold.  Weight this morning 162 pounds.  Continues to adjust to IL. ?3/17--did not sleep well last night but states she feels a little better.  Continues to have some shortness of breath and elevated blood pressures.  Does report not taking lasix due to "not having fluid in extremities".  Discussed heart failure and s/s of fluid  build up in lungs and encouraged patient to take lasix, weight 166 pounds, BP continues to be 160-170/90's ?5/8--states she feels much better.  Reports very little ankle swelling, SOB with exertion only.  Taking lasix daily now.  Has not been checking BP at home lately (high readings upsetting to her) but recent provider appointments pressures has been much better.  Acknowledges increase in A1C and discussed changes in eating habits.  Weight 165.5.  leg wound healing; left hand biopsy site still open with little drainage, completed antibiotics and changes dressing. ? ?Nurse Case Manager Clinical Goal(s):  ?Patient will report no hospitalization related to heart failure exacerbation within the next 90 days. ?patient will weigh self daily and record ?patient will verbalize understanding of Heart Failure  Action Plan and when to call doctor ?patient will take all Heart Failure mediations as prescribed ?Patient will continue to monitor heart rates and notify provider if elevated heart rates sustains ? ? ?Heart Failure Interventions:   Goal on rack (progressing): yes ?Collaboration with Crecencio Mc, MD regarding development and update of comprehensive plan of care as evidenced by provider attestation and co-signature ?Inter-disciplinary care team collaboration (see longitudinal plan of care) ?Basic overview and discussion of pathophysiology of Heart Failure ?Reviewed Heart Failure Action Plan in depth and provided written copy; encouraged patient to  review ?Encouraged continued daily weight monitoring, documenting in log for provider review ?Reviewed medications and encouraged medication compliance;  ?Reviewed role of diuretics in prevention of fluid overload and dosa

## 2021-10-24 NOTE — Patient Instructions (Addendum)
Visit Information ? ?Thank you for taking time to visit with me today. Please don't hesitate to contact me if I can be of assistance to you before our next scheduled telephone appointment. ? ?Following are the goals we discussed today:  ?Call office if I gain more than 2 pounds in one day or 5 pounds in one week ?Keep legs up/elevated while sitting and wear support hose ?Watch for swelling in feet, ankles and legs every day ?Weigh myself daily and keep in log to take to provider for review ?Follow rescue plan and know when to call the doctor ?Continue to monitor blood pressures daily taking log to provider review/monitoring for light headiness ?Check blood pressures weekly and notify providers for sustained elevations ?Try to stay calm and relax ?TAKE LASIX AS PRESCRIBED ?Schedule eye exam ? ?Our next appointment is by telephone on 6/12 at 1000 ? ?Please call the care guide team at [phone] if you need to cancel or reschedule your appointment.  ? ?If you are experiencing a Mental Health or [place] or need someone to talk to, please call the Suicide and Crisis Lifeline: 988 ?call the [place] National Suicide Prevention Lifeline: (773)273-5328 or TTY: (323) 804-5737 TTY 2485256517) to talk to a trained counselor ?call 1-800-273-TALK (toll free, 24 hour hotline) ?call 911  ? ?Patient verbalizes understanding of instructions and care plan provided today and agrees to view in [place]. Active MyChart status confirmed with patient.   ?[name] RN, MSN ?RN Care Management Coordinator ?[place] ?[phone] ?[email or web address] ? ?

## 2021-10-31 ENCOUNTER — Ambulatory Visit (INDEPENDENT_AMBULATORY_CARE_PROVIDER_SITE_OTHER): Payer: Medicare Other | Admitting: Dermatology

## 2021-10-31 DIAGNOSIS — L570 Actinic keratosis: Secondary | ICD-10-CM

## 2021-10-31 DIAGNOSIS — L578 Other skin changes due to chronic exposure to nonionizing radiation: Secondary | ICD-10-CM

## 2021-10-31 DIAGNOSIS — L820 Inflamed seborrheic keratosis: Secondary | ICD-10-CM | POA: Diagnosis not present

## 2021-10-31 DIAGNOSIS — Z85828 Personal history of other malignant neoplasm of skin: Secondary | ICD-10-CM

## 2021-10-31 DIAGNOSIS — L814 Other melanin hyperpigmentation: Secondary | ICD-10-CM

## 2021-10-31 DIAGNOSIS — L821 Other seborrheic keratosis: Secondary | ICD-10-CM

## 2021-10-31 NOTE — Patient Instructions (Signed)

## 2021-10-31 NOTE — Progress Notes (Signed)
? ?Follow-Up Visit ?  ?Subjective  ?Linda [name] is a 88 y.o. female who presents for the following: hx of SCC (L dorsal hand, mupirocin oint qd/R lower pretibia recheck) and check spots (Upper lip, scaly spots, 74mR jaw, 2-3 yrs, no symptoms/Back, R hand, itchy prn, otc HC cream). ? ?The patient has spots, moles and lesions to be evaluated, some may be new or changing and the patient has concerns that these could be cancer. ? ? ?The following portions of the chart were reviewed this encounter and updated as appropriate:  ?  ?  ? ?Review of Systems:  No other skin or systemic complaints except as noted in HPI or Assessment and Plan. ? ?Objective  ?Well appearing patient in no apparent distress; mood and affect are within normal limits. ? ?A focused examination was performed including face, back, hands, right lower leg. Relevant physical exam findings are noted in the Assessment and Plan. ? ?L dorsal hand; Right Lower pretibia ? ?L dorsal hand ?Pink smooth plaque with central small ulceration ? ?Right Lower pretibia ?Well healed scar with no evidence of recurrence ? ?L 2nd webspace, L hand dorsum, post lower neck ?Stuck on waxy paps with erythema ? ?upper lip ?Pink scaly macule ? ? ? ?Assessment & Plan  ? ?Actinic Damage ?- chronic, secondary to cumulative UV radiation exposure/sun exposure over time ?- diffuse scaly erythematous macules with underlying dyspigmentation ?- Recommend daily broad spectrum sunscreen SPF 30+ to sun-exposed areas, reapply every 2 hours as needed.  ?- Recommend staying in the shade or wearing long sleeves, sun glasses (UVA+UVB protection) and wide brim hats (4-inch brim around the entire circumference of the hat). ?- Call for new or changing lesions.  ? ?Seborrheic Keratoses ?- Stuck-on, waxy, tan-brown papules and/or plaques  ?- Benign-appearing ?- Discussed benign etiology and prognosis. ?- Observe ?- Call for any changes ?- hands, R jaw, L temporal hairline ? ?Lentigines ?- Scattered  tan macules ?- Due to sun exposure ?- Benign-appering, observe ?- Recommend daily broad spectrum sunscreen SPF 30+ to sun-exposed areas, reapply every 2 hours as needed. ?- Call for any changes ?- hands ? ?History of SCC (squamous cell carcinoma) of skin (2) ?L dorsal hand; Right Lower pretibia ? ?Clear. Observe for recurrence. Call clinic for new or changing lesions.  Recommend regular skin exams, daily broad-spectrum spf 30+ sunscreen use, and photoprotection.   ? ?Cont Mupirocin oint qd to L dorsal hand until healed ? ?Inflamed seborrheic keratosis ?L 2nd webspace, L hand dorsum, post lower neck ? ?Benign, observe.   ? ?Pt declines LN2 today, may treat on f/u ? ?AK (actinic keratosis) ?upper lip ? ?Pt declines LN2 today, will plan LN2 on f/u ? ?Actinic keratoses are precancerous spots that appear secondary to cumulative UV radiation exposure/sun exposure over time. They are chronic with expected duration over 1 year. A portion of actinic keratoses will progress to squamous cell carcinoma of the skin. It is not possible to reliably predict which spots will progress to skin cancer and so treatment is recommended to prevent development of skin cancer. ? ?Recommend daily broad spectrum sunscreen SPF 30+ to sun-exposed areas, reapply every 2 hours as needed.  ?Recommend staying in the shade or wearing long sleeves, sun glasses (UVA+UVB protection) and wide brim hats (4-inch brim around the entire circumference of the hat). ?Call for new or changing lesions.  ? ? ?Return in about 6 weeks (around 12/12/2021) for recheck hx of SCC L dorsal hand, txt AK upper  lip, txt ISKs L hand, post lower neck. ? ?I, Othelia Pulling, RMA, am acting as scribe for Brendolyn Patty, MD . ? ?Documentation: I have reviewed the above documentation for accuracy and completeness, and I agree with the above. ? ?Brendolyn Patty MD  ? ?

## 2021-11-01 ENCOUNTER — Ambulatory Visit (INDEPENDENT_AMBULATORY_CARE_PROVIDER_SITE_OTHER): Payer: Medicare Other

## 2021-11-01 VITALS — Ht 63.5 in | Wt 167.0 lb

## 2021-11-01 DIAGNOSIS — Z0000 Encounter for general adult medical examination without abnormal findings: Secondary | ICD-10-CM | POA: Diagnosis not present

## 2021-11-01 NOTE — Progress Notes (Signed)
Subjective:   Linda [name] is a 88 y.o. female who presents for Medicare Annual (Subsequent) preventive examination.  Review of Systems    No ROS.  Medicare Wellness Virtual Visit.  Visual/audio telehealth visit, UTA vital signs.   See social history for additional risk factors.   Cardiac Risk Factors include: advanced age (>6men, >31 women);hypertension;diabetes mellitus     Objective:    Today's Vitals   11/01/21 0848  Weight: 167 lb (75.8 kg)  Height: 5' 3.5" (1.613 m)   Body mass index is 29.12 kg/m.     11/01/2021    9:06 AM 08/25/2020   10:25 AM 04/15/2020    9:14 AM 12/17/2019    8:41 AM 08/07/2019    5:33 AM 04/09/2019    8:02 AM 03/05/2019    6:51 AM  Advanced Directives  Does Patient Have a Medical Advance Directive? Yes Yes Yes Yes No Yes Yes  Type of Estate agent of [place];Living will Living will Healthcare Power of [place];Living will Healthcare Power of [place];Living will  Healthcare Power of [place];Living will Healthcare Power of [place];Living will  Does patient want to make changes to medical advance directive? No - Patient declined No - Patient declined No - Patient declined    No - Guardian declined  Copy of Healthcare Power of Attorney in Chart? Yes - validated most recent copy scanned in chart (See row information)  Yes - validated most recent copy scanned in chart (See row information) Yes - validated most recent copy scanned in chart (See row information)  Yes - validated most recent copy scanned in chart (See row information) No - copy requested  Would patient like information on creating a medical advance directive?     No - Patient declined      Current Medications (verified) Outpatient Encounter Medications as of 11/01/2021  Medication Sig   acetaminophen (TYLENOL) 500 MG tablet Take 500 mg by mouth every 6 (six) hours as needed for moderate pain or headache.   cetirizine (ZYRTEC) 10 MG tablet Take 10 mg by mouth daily  as needed for allergies.   Cholecalciferol (VITAMIN D) 2000 units tablet Take 2,000 Units by mouth daily.   ELIQUIS 5 MG TABS tablet Take 1 tablet (5 mg total) by mouth 2 (two) times daily.   estradiol (ESTRACE) 0.1 MG/GM vaginal cream Place 1 g vaginally 3 (three) times a week. Pea sized amount per urethra   furosemide (LASIX) 20 MG tablet Take 40 mg by mouth daily.   losartan (COZAAR) 25 MG tablet Take 1 tablet (25 mg total) by mouth daily.   metoprolol succinate (TOPROL-XL) 25 MG 24 hr tablet Take 12.5 mg by mouth daily.   mupirocin ointment (BACTROBAN) 2 % Apply 1 application. topically daily. Qd to wound on hand until healed   potassium chloride SA (KLOR-CON M) 20 MEQ tablet Take 1 tablet (20 mEq total) by mouth daily.   traZODone (DESYREL) 50 MG tablet Take 50 mg by mouth at bedtime.   No facility-administered encounter medications on file as of 11/01/2021.    Allergies (verified) Atorvastatin, Barium iodide, Statins, Crestor [rosuvastatin calcium], Penicillins, Sulfa antibiotics, and Sulfonamide derivatives   History: Past Medical History:  Diagnosis Date   Anxiety    Arrhythmia    paroxysmal atrial fibrillation   CHF (congestive heart failure) (HCC)    Colon polyp    Diverticulitis    Dysrhythmia    GERD (gastroesophageal reflux disease)    Hard of hearing  Hoarseness    Hyperlipidemia    Hypertension    Kidney stone    Motion sickness    Parathyroid disease (HCC)    Parathyroidectomy    PONV (postoperative nausea and vomiting)    Pre-diabetes    PVC (premature ventricular contraction)    Skin cancer    Squamous cell carcinoma of skin 07/19/2021   right lower pretibia lateral, EDC   Squamous cell carcinoma of skin 10/13/2014   R ant neck - KA pattern   Squamous cell carcinoma of skin 09/15/2021   Left dorsal hand, shaved down to fat at time of biopsy so should already be removed.  Will observe for recurrence.   Stroke [facility])    TIA (transient ischemic attack)  04/2018   no deficitis   UTI (lower urinary tract infection)    Varicose veins of both lower extremities    Wears hearing aid in both ears    Past Surgical History:  Procedure Laterality Date   ABDOMINAL HYSTERECTOMY     APPENDECTOMY     CATARACT EXTRACTION W/PHACO Left 03/05/2019   Procedure: CATARACT EXTRACTION PHACO AND INTRAOCULAR LENS PLACEMENT (IOC)   0:57 15.6% 9.05;  Surgeon: Lockie Mola, MD;  Location: [facility];  Service: Ophthalmology;  Laterality: Left;  Diabetic - diet cotrolled   CATARACT EXTRACTION W/PHACO Right 04/09/2019   Procedure: CATARACT EXTRACTION PHACO AND INTRAOCULAR LENS PLACEMENT (IOC) RIGHT DIABETIC 00:47.6  15.9%  7.60;  Surgeon: Lockie Mola, MD;  Location: [facility];  Service: Ophthalmology;  Laterality: Right;   CHOLECYSTECTOMY  2002   ESOPHAGOGASTRODUODENOSCOPY (EGD) WITH PROPOFOL N/A 12/17/2019   Procedure: ESOPHAGOGASTRODUODENOSCOPY (EGD) WITH PROPOFOL;  Surgeon: Earline Mayotte, MD;  Location: [facility];  Service: Endoscopy;  Laterality: N/A;   LITHOTRIPSY     MITRAL VALVE REPAIR  12/14/2020   PARATHYROIDECTOMY  2004   1 removed   ROTATOR CUFF REPAIR  2002   TEE WITHOUT CARDIOVERSION N/A 09/29/2020   Procedure: TRANSESOPHAGEAL ECHOCARDIOGRAM (TEE);  Surgeon: Dalia Heading, MD;  Location: [facility];  Service: Cardiovascular;  Laterality: N/A;   TONSILLECTOMY  1956   TONSILLECTOMY     TOTAL ABDOMINAL HYSTERECTOMY W/ BILATERAL SALPINGOOPHORECTOMY  1984   VEIN LIGATION AND STRIPPING     Family History  Problem Relation Age of Onset   Heart disease Brother    Stroke Mother    Skin cancer Father    Breast cancer Paternal Grandmother    Social History   Socioeconomic History   Marital status: Widowed    Spouse name: Genevie Cheshire   Number of children: 2   Years of education: 12   Highest education level: Not on file  Occupational History   Occupation: Secretary/administrator: RETIRED    Comment: Retired   Tobacco Use   Smoking status: Never   Smokeless tobacco: Never  Vaping Use   Vaping Use: Never used  Substance and Sexual Activity   Alcohol use: No   Drug use: No   Sexual activity: Not Currently    Birth control/protection: None  Other Topics Concern   Not on file  Social History Narrative   Denna is from [place] and [place]. Recently widowed. She was married to [place], her husband for 59 years. They have a daughter and a son. She enjoys gardening.   Social Determinants of Health   Financial Resource Strain: Low Risk    Difficulty of Paying Living Expenses: Not very hard  Food Insecurity:  No Food Insecurity   Worried About Programme researcher, broadcasting/film/video in the Last Year: Never true   Ran Out of Food in the Last Year: Never true  Transportation Needs: No Transportation Needs   Lack of Transportation (Medical): No   Lack of Transportation (Non-Medical): No  Physical Activity: Not on file  Stress: No Stress Concern Present   Feeling of Stress : Not at all  Social Connections: Moderately Integrated   Frequency of Communication with Friends and Family: Twice a week   Frequency of Social Gatherings with Friends and Family: Once a week   Attends Religious Services: 1 to 4 times per year   Active Member of [employer] or Organizations: Yes   Attends Banker Meetings: 1 to 4 times per year   Marital Status: Widowed    Tobacco Counseling Counseling given: Not Answered   Clinical Intake:  Pre-visit preparation completed: Yes        Diabetes: Yes (Followed by PCP)  How often do you need to have someone help you when you read instructions, pamphlets, or other written materials from your doctor or pharmacy?: 1 - Never   Interpreter Needed?: No      Activities of Daily Living    11/01/2021    9:07 AM  In your present state of health, do you have any difficulty performing the following activities:  Hearing? 1  Comment Hearing aids  Vision? 0   Difficulty concentrating or making decisions? 0  Walking or climbing stairs? 1  Comment Walker in use  Dressing or bathing? 0  Doing errands, shopping? 0  Preparing Food and eating ? Y  Comment Staff assist with meal prep. Self feeds.  Using the Toilet? N  In the past six months, have you accidently leaked urine? Y  Comment Managed with daily pad  Do you have problems with loss of bowel control? N  Managing your Medications? N  Managing your Finances? N  Housekeeping or managing your Housekeeping? Y  Comment Maid assist    Patient Care Team: Sherlene Shams, MD as PCP - General (Internal Medicine) Maple Mirza, RN as Case Manager  Indicate any recent Medical Services you may have received from other than [facility] providers in the past year (date may be approximate).     Assessment:   This is a routine wellness examination for [place]e.  Virtual Visit via Telephone Note  I connected with  Linda [name] on 11/01/21 at  8:45 AM EDT by telephone and verified that I am speaking with the correct person using two identifiers.  Persons participating in the virtual visit: patient/Nurse Health Advisor   I discussed the limitations of performing an evaluation and management service by telehealth. We continued and completed visit with audio only. Some vital signs may be absent or patient reported.   Hearing/Vision screen Hearing Screening - Comments:: Hearing aids Vision Screening - Comments:: Followed by [facility] Wears corrective lenses    Dietary issues and exercise activities discussed: Current Exercise Habits: Home exercise routine, Type of exercise: walking, Intensity: Mild Healthy diet Good water intake   Goals Addressed             This Visit's Progress    Healthy Lifestyle       STAY HYDRATED/Healthy diet STAY ACTIVE        Depression Screen    11/01/2021    8:58 AM 07/13/2021   10:03 AM 12/30/2020    9:02 AM 08/25/2020   10:42  AM 04/15/2020     9:12 AM 12/15/2019   10:16 AM 03/22/2018   11:57 AM  PHQ 2/9 Scores  PHQ - 2 Score 0 0 2 1 1 1  0  PHQ- 9 Score   6   5     Fall Risk    11/01/2021    9:07 AM 10/19/2021    9:39 AM 07/13/2021   10:03 AM 12/29/2020    4:41 PM 08/25/2020   10:39 AM  Fall Risk   Falls in the past year? 0 0 0 0 0  Number falls in past yr: 0  0  0  Injury with Fall?   0  0  Risk for fall due to :  No Fall Risks No Fall Risks  Medication side effect;Impaired vision;Impaired mobility;Impaired balance/gait  Follow up Falls evaluation completed Falls evaluation completed Falls evaluation completed Falls evaluation completed Falls evaluation completed;Education provided;Falls prevention discussed    FALL RISK PREVENTION PERTAINING TO THE HOME: Home free of loose throw rugs in walkways, pet beds, electrical cords, etc? Yes  Adequate lighting in your home to reduce risk of falls? Yes   ASSISTIVE DEVICES UTILIZED TO PREVENT FALLS: Life alert? Yes  Use of a cane, walker or w/c? Yes  Grab bars in the bathroom? Yes  Shower chair or bench in shower? Yes  Elevated toilet seat or a handicapped toilet? Yes   TIMED UP AND GO: Was the test performed? No .   Cognitive Function: Patient is alert and oriented x3.     02/26/2017    9:47 AM 02/01/2016    1:24 PM  MMSE - Mini Mental State Exam  Orientation to time 5 5  Orientation to Place 5 5  Registration 3 3  Attention/ Calculation 5 5  Recall 3 3  Language- name 2 objects 2 2  Language- repeat 1 1  Language- follow 3 step command 3 3  Language- read & follow direction 1 1  Write a sentence 1 1  Copy design 1 1  Total score 30 30        03/22/2018   12:04 PM  6CIT Screen  What Year? 0 points  What month? 0 points  What time? 0 points  Count back from 20 0 points  Months in reverse 0 points  Repeat phrase 0 points  Total Score 0 points    Immunizations Immunization History  Administered Date(s) Administered   Fluad Quad(high Dose 65+) 04/04/2019,  05/27/2020, 03/22/2021   Influenza-Unspecified 02/17/2013, 03/02/2014, 03/05/2015, 03/31/2016, 04/03/2018   PFIZER(Purple Top)SARS-COV-2 Vaccination 07/03/2019, 07/24/2019, 03/31/2020   Pneumococcal Conjugate-13 03/04/2014   Pneumococcal Polysaccharide-23 11/18/2010, 02/07/2018   Td 11/17/2009   Shingrix Completed?: No.    Education has been provided regarding the importance of this vaccine. Patient has been advised to call insurance company to determine out of pocket expense if they have not yet received this vaccine. Advised may also receive vaccine at local pharmacy or Health Dept. Verbalized acceptance and understanding.  Screening Tests Health Maintenance  Topic Date Due   COVID-19 Vaccine (4 - Booster for Pfizer series) 11/04/2021 (Originally 05/26/2020)   Zoster Vaccines- Shingrix (1 of 2) 02/01/2022 (Originally 11/10/1952)   INFLUENZA VACCINE  01/17/2022   TETANUS/TDAP  03/27/2022   OPHTHALMOLOGY EXAM  04/15/2022   HEMOGLOBIN A1C  04/21/2022   FOOT EXAM  07/21/2022   Pneumonia Vaccine 28+ Years old  Completed   DEXA SCAN  Completed   HPV VACCINES  Aged Out   MAMMOGRAM  Discontinued  Health Maintenance There are no preventive care reminders to display for this patient.  Lung Cancer Screening: (Low Dose CT Chest recommended if Age 6-80 years, 30 pack-year currently smoking OR have quit w/in 15years.) does not qualify.   Hepatitis C Screening: does not qualify.  Vision Screening: Recommended annual ophthalmology exams for early detection of glaucoma and other disorders of the eye.  Dental Screening: Recommended annual dental exams for proper oral hygiene  Community Resource Referral / Chronic Care Management: CRR required this visit?  No   CCM required this visit?  No      Plan:   Keep all routine maintenance appointments.   I have personally reviewed and noted the following in the patient's chart:   Medical and social history Use of alcohol, tobacco or illicit  drugs  Current medications and supplements including opioid prescriptions.  Functional ability and status Nutritional status Physical activity Advanced directives List of other physicians Hospitalizations, surgeries, and ER visits in previous 12 months Vitals Screenings to include cognitive, depression, and falls Referrals and appointments  In addition, I have reviewed and discussed with patient certain preventive protocols, quality metrics, and best practice recommendations. A written personalized care plan for preventive services as well as general preventive health recommendations were provided to patient.     Ashok Pall, LPN   1/61/0960

## 2021-11-01 NOTE — Patient Instructions (Addendum)
?  Linda [name] , ?Thank you for taking time to come for your Medicare Wellness Visit. I appreciate your ongoing commitment to your health goals. Please review the following plan we discussed and let me know if I can assist you in the future.  ? ?These are the goals we discussed: ? Goals   ? ?  Healthy Lifestyle   ?  STAY HYDRATED/Healthy diet ?STAY ACTIVE ? ?  ? ?  ?  ?This is a list of the screening recommended for you and due dates:  ?Health Maintenance  ?Topic Date Due  ? COVID-19 Vaccine (4 - Booster for Pfizer series) 11/04/2021*  ? Zoster (Shingles) Vaccine (1 of 2) 02/01/2022*  ? Flu Shot  01/17/2022  ? Tetanus Vaccine  03/27/2022  ? Eye exam for diabetics  04/15/2022  ? Hemoglobin A1C  04/21/2022  ? Complete foot exam   07/21/2022  ? Pneumonia Vaccine  Completed  ? DEXA scan (bone density measurement)  Completed  ? HPV Vaccine  Aged Out  ? Mammogram  Discontinued  ?*Topic was postponed. The date shown is not the original due date.  ?  ?

## 2021-11-16 DIAGNOSIS — I110 Hypertensive heart disease with heart failure: Secondary | ICD-10-CM | POA: Diagnosis not present

## 2021-11-16 DIAGNOSIS — I4891 Unspecified atrial fibrillation: Secondary | ICD-10-CM

## 2021-11-16 DIAGNOSIS — I509 Heart failure, unspecified: Secondary | ICD-10-CM

## 2021-11-24 ENCOUNTER — Encounter: Payer: Self-pay | Admitting: Internal Medicine

## 2021-11-24 ENCOUNTER — Ambulatory Visit (INDEPENDENT_AMBULATORY_CARE_PROVIDER_SITE_OTHER): Payer: Medicare Other | Admitting: Internal Medicine

## 2021-11-24 VITALS — BP 152/88 | HR 57 | Temp 97.4°F | Ht 63.5 in | Wt 170.4 lb

## 2021-11-24 DIAGNOSIS — I70219 Atherosclerosis of native arteries of extremities with intermittent claudication, unspecified extremity: Secondary | ICD-10-CM

## 2021-11-24 DIAGNOSIS — E876 Hypokalemia: Secondary | ICD-10-CM | POA: Diagnosis not present

## 2021-11-24 DIAGNOSIS — I4811 Longstanding persistent atrial fibrillation: Secondary | ICD-10-CM | POA: Diagnosis not present

## 2021-11-24 DIAGNOSIS — E1129 Type 2 diabetes mellitus with other diabetic kidney complication: Secondary | ICD-10-CM | POA: Diagnosis not present

## 2021-11-24 DIAGNOSIS — R809 Proteinuria, unspecified: Secondary | ICD-10-CM

## 2021-11-24 DIAGNOSIS — I428 Other cardiomyopathies: Secondary | ICD-10-CM

## 2021-11-24 MED ORDER — BLOOD GLUCOSE METER KIT
PACK | 0 refills | Status: DC
Start: 2021-11-24 — End: 2022-02-09

## 2021-11-24 MED ORDER — DAPAGLIFLOZIN PROPANEDIOL 5 MG PO TABS: ORAL_TABLET | ORAL | 0 refills | Status: DC

## 2021-11-24 NOTE — Progress Notes (Unsigned)
Subjective:  Patient ID: Linda [name], female    DOB: 14-May-1934  Age: 88 y.o. MRN: [id number]  CC: The encounter diagnosis was Hypokalemia.   HPI [facility] presents for follow up on   type 2 DM,  hypokalemia,  and new onset dyspnea secondary to valvular cardiomyopathy Chief Complaint  Patient presents with   Follow-up    1 month follow up. Pt was started on potassium after labs were done on 10/19/2021 and advised to return in 1 month.    Taking 20 mg lasix daily ,  and 20 meq of potassium daily since last visit  but has had a   Wt gain of 3 lbs noted.  Still short of breath with activity.  Giving up gardening  due to inability to  tolerate the activity .  She has declined valve surgery.  Discussed adding SGLPT inhibitor to improve diuresis and manage her diabetes  T2DM:  a1c has risen to 7.0.  she is unable to exercise due to heart failure.    Outpatient Medications Prior to Visit  Medication Sig Dispense Refill   acetaminophen (TYLENOL) 500 MG tablet Take 500 mg by mouth every 6 (six) hours as needed for moderate pain or headache.     cetirizine (ZYRTEC) 10 MG tablet Take 10 mg by mouth daily as needed for allergies.     Cholecalciferol (VITAMIN D) 2000 units tablet Take 2,000 Units by mouth daily.     ELIQUIS 5 MG TABS tablet Take 1 tablet (5 mg total) by mouth 2 (two) times daily. 60 tablet 0   estradiol (ESTRACE) 0.1 MG/GM vaginal cream Place 1 g vaginally 3 (three) times a week. Pea sized amount per urethra 42.5 g 12   furosemide (LASIX) 20 MG tablet Take 40 mg by mouth daily.     losartan (COZAAR) 25 MG tablet Take 1 tablet (25 mg total) by mouth daily. 90 tablet 1   metoprolol succinate (TOPROL-XL) 25 MG 24 hr tablet Take 12.5 mg by mouth daily.     potassium chloride SA (KLOR-CON M) 20 MEQ tablet Take 1 tablet (20 mEq total) by mouth daily. 30 tablet 3   traZODone (DESYREL) 50 MG tablet Take 50 mg by mouth at bedtime.     mupirocin ointment (BACTROBAN) 2 % Apply 1  application. topically daily. Qd to wound on hand until healed (Patient not taking: Reported on 11/24/2021) 22 g 0   No facility-administered medications prior to visit.    Review of Systems;  Patient denies headache, fevers, malaise, unintentional weight loss, skin rash, eye pain, sinus congestion and sinus pain, sore throat, dysphagia,  hemoptysis , cough, dyspnea, wheezing, chest pain, palpitations,  abdominal pain, nausea, melena, diarrhea, constipation, flank pain, dysuria, hematuria, urinary  Frequency, nocturia, numbness, tingling, seizures,  Focal weakness, Loss of consciousness,  Tremor, insomnia, depression, anxiety, and suicidal ideation.      Objective:  BP (!) 152/88 (BP Location: Left Arm, Patient Position: Sitting, Cuff Size: Normal)   Pulse (!) 57   Temp (!) 97.4 F (36.3 C) (Oral)   Ht 5' 3.5" (1.613 m)   Wt 170 lb 6.4 oz (77.3 kg)   SpO2 95%   BMI 29.71 kg/m   BP Readings from Last 3 Encounters:  11/24/21 (!) 152/88  10/19/21 136/82  07/21/21 (!) 156/84    Wt Readings from Last 3 Encounters:  11/24/21 170 lb 6.4 oz (77.3 kg)  11/01/21 167 lb (75.8 kg)  10/19/21 167 lb 9.6 oz (76  kg)    General appearance: alert, cooperative and appears stated age Ears: normal TM's and external ear canals both ears Throat: lips, mucosa, and tongue normal; teeth and gums normal Neck: no adenopathy, no carotid bruit, supple, symmetrical, trachea midline and thyroid not enlarged, symmetric, no tenderness/mass/nodules Back: symmetric, no curvature. ROM normal. No CVA tenderness. Lungs: clear to auscultation bilaterally Heart: regular rate and rhythm, S1, S2 normal, no murmur, click, rub or gallop Abdomen: soft, non-tender; bowel sounds normal; no masses,  no organomegaly Pulses: 2+ and symmetric Skin: Skin color, texture, turgor normal. No rashes or lesions Lymph nodes: Cervical, supraclavicular, and axillary nodes normal.  Lab Results  Component Value Date   HGBA1C 7.0 (H)  10/19/2021   HGBA1C 6.1 (A) 07/21/2021   HGBA1C 7.2 (H) 09/09/2020    Lab Results  Component Value Date   CREATININE 0.80 10/19/2021   CREATININE 0.79 07/21/2021   CREATININE 0.84 07/06/2021    Lab Results  Component Value Date   WBC 5.5 07/06/2021   HGB 14.9 07/06/2021   HCT 44.7 07/06/2021   PLT 164.0 07/06/2021   GLUCOSE 92 10/19/2021   CHOL 163 10/19/2021   TRIG 67.0 10/19/2021   HDL 66.40 10/19/2021   LDLDIRECT 90.0 10/19/2021   LDLCALC 84 10/19/2021   ALT 24 10/19/2021   AST 24 10/19/2021   NA 140 10/19/2021   K 3.3 (L) 10/19/2021   CL 99 10/19/2021   CREATININE 0.80 10/19/2021   BUN 19 10/19/2021   CO2 34 (H) 10/19/2021   TSH 1.91 05/27/2020   INR 1.78 05/11/2018   HGBA1C 7.0 (H) 10/19/2021   MICROALBUR 7.5 (H) 07/21/2021    US Venous Img Lower Unilateral Right (DVT)  Result Date: 07/11/2021 CLINICAL DATA:  Swelling and pain of right lower leg.  Injury. EXAM: RIGHT LOWER EXTREMITY VENOUS DOPPLER ULTRASOUND TECHNIQUE: Gray-scale sonography with compression, as well as color and duplex ultrasound, were performed to evaluate the deep venous system(s) from the level of the common femoral vein through the popliteal and proximal calf veins. COMPARISON:  None. FINDINGS: VENOUS Normal compressibility of the common femoral, superficial femoral, and popliteal veins, as well as the visualized calf veins. Visualized portions of profunda femoral vein and great saphenous vein unremarkable. No filling defects to suggest DVT on grayscale or color Doppler imaging. Doppler waveforms show normal direction of venous flow, normal respiratory plasticity and response to augmentation. Limited views of the contralateral common femoral vein are unremarkable. OTHER A simple fluid collection is seen within the right popliteal fossa measuring 4.7 x 1.0 x 2.2 cm. Additional more heterogeneous avascular fluid collection seen within the anterior right lower leg in the area of the patient's injury  measuring 5.1 x 0.8 x 2.5 cm. Limitations: none IMPRESSION:: IMPRESSION: 1. No deep venous thrombosis is visualized. 2. Probable Baker's cyst measuring up to 4.7 cm. 3. In the area of the patient's anterior right lower leg injury, there is more heterogeneous fluid collection, presumably a combination of soft tissue swelling, edema, and/or hematoma. Electronically Signed   By: Yvonne Kendall M.D.   On: 07/11/2021 11:58    Assessment & Plan:   Problem List Items Addressed This Visit   None Visit Diagnoses     Hypokalemia    -  Primary       I spent a total of   minutes with this patient in a face to face visit on the date of this encounter reviewing the last office visit with me on        ,  most recent with patient's cardiologist in    ,  patient'ss diet and eating habits, home blood pressure readings ,  most recent imaging study ,   and post visit ordering of testing and therapeutics.    Follow-up: No follow-ups on file.   Crecencio Mc, MD

## 2021-11-24 NOTE — Patient Instructions (Addendum)
I am recommending starting a new medication to help manage your diabetes and control your fluid retention  Start Farxiga every other day for the first 14 days .  After 14 days,  increase the dose to once daily    Weigh yourself every day to monitor   Return for nurse visit to learn how to use the glucose meter   DO NOT START THE [place]

## 2021-11-25 ENCOUNTER — Telehealth: Payer: Self-pay

## 2021-11-25 NOTE — Telephone Encounter (Signed)
Patient states she would like to schedule an appointment to learn how to test for diabetes.  Please let us know if this needs to be scheduled with the provider or the nurse.    Also, patient states Medicare won't pay for what we ordered for her at the pharmacy, so we need to choose something for her that Medicare will cover.  *Patient states her preferred pharmacy is [facility].  Patient states if we are unable to get back with her until next week, she has a lot of appointments on 11/28/2021, so please don't call her until after 12noon on 11/29/2021.

## 2021-11-26 NOTE — Assessment & Plan Note (Signed)
Rate controlled currently

## 2021-11-26 NOTE — Assessment & Plan Note (Addendum)
She has severe MR and TR , with pulmonary hypertension .  EF estimated at 45% .  She has been prescribed furosemide by her cardiologist byt has gained 3 lbs since last visit.  Adding [place] .  She is encouarged to walk daily to maintain conditioning

## 2021-11-26 NOTE — Assessment & Plan Note (Signed)
She has aortoiliac disease by CT in 2018.  She now leads a sedentary life.

## 2021-11-26 NOTE — Assessment & Plan Note (Signed)
Adding SGLT 2 inhibitor  Samples of Farxiga given 5 mg dose . Start with QOD dosing x 14 days,  Then daily thereafter   Lab Results  Component Value Date   HGBA1C 7.0 (H) 10/19/2021

## 2021-11-28 ENCOUNTER — Ambulatory Visit (INDEPENDENT_AMBULATORY_CARE_PROVIDER_SITE_OTHER): Payer: Medicare Other | Admitting: *Deleted

## 2021-11-28 DIAGNOSIS — I10 Essential (primary) hypertension: Secondary | ICD-10-CM

## 2021-11-28 DIAGNOSIS — E1129 Type 2 diabetes mellitus with other diabetic kidney complication: Secondary | ICD-10-CM

## 2021-11-28 DIAGNOSIS — I428 Other cardiomyopathies: Secondary | ICD-10-CM

## 2021-11-28 NOTE — Patient Instructions (Addendum)
Visit Information  Thank you for taking time to visit with me today. Please don't hesitate to contact me if I can be of assistance to you before our next scheduled telephone appointment.  Following are the goals we discussed today:  Call office if I gain more than 2 pounds in one day or 5 pounds in one week Keep legs up/elevated while sitting and wear support hose Watch for swelling in feet, ankles and legs every day Weigh myself daily and keep in log to take to provider for review Follow rescue plan and know when to call the doctor Continue to monitor blood pressures daily taking log to provider review/monitoring for light headiness Check blood pressures weekly and notify providers for sustained elevations Try to stay calm and relax TAKE LASIX AND FARXIGA AS PRESCRIBED Schedule eye exam   Our next appointment is by telephone on 7/12 at 1000  Please call the care guide team at [phone] if you need to cancel or reschedule your appointment.   If you are experiencing a Mental Health or Colony or need someone to talk to, please call the Suicide and Crisis Lifeline: 988 call the [place] National Suicide Prevention Lifeline: 236-153-1555 or TTY: 680-255-9388 TTY (406) 449-6858) to talk to a trained counselor call 1-800-273-TALK (toll free, 24 hour hotline) call 911   Patient verbalizes understanding of instructions and care plan provided today and agrees to view in [place]. Active MyChart status and patient understanding of how to access instructions and care plan via MyChart confirmed with patient.     [name] RN, MSN RN Care Management Coordinator [place] [phone] [email or web address]

## 2021-11-28 NOTE — Chronic Care Management (AMB) (Signed)
Chronic Care Management   CCM RN Visit Note  11/28/2021 Name: Linda [name] MRN: [id number] DOB: 10-06-1933  Subjective: Linda [name] is a 88 y.o. year old female who is a primary care patient of Crecencio Mc, MD. The care management team was consulted for assistance with disease management and care coordination needs.    Engaged with patient by telephone for follow up visit in response to provider referral for case management and/or care coordination services.   Consent to Services:  The patient was given information about Chronic Care Management services, agreed to services, and gave verbal consent prior to initiation of services.  Please see initial visit note for detailed documentation.   Patient agreed to services and verbal consent obtained.   Assessment: Review of patient past medical history, allergies, medications, health status, including review of consultants reports, laboratory and other test data, was performed as part of comprehensive evaluation and provision of chronic care management services.   SDOH (Social Determinants of Health) assessments and interventions performed:    CCM Care Plan  Allergies  Allergen Reactions   Atorvastatin Other (See Comments)    Myalgias    Barium Iodide     Unknown   Statins Other (See Comments)    Myalgias    Crestor [Rosuvastatin Calcium] Other (See Comments)    Myalgia    Penicillins Rash   Sulfa Antibiotics Rash   Sulfonamide Derivatives Rash    Outpatient Encounter Medications as of 11/28/2021  Medication Sig Note   acetaminophen (TYLENOL) 500 MG tablet Take 500 mg by mouth every 6 (six) hours as needed for moderate pain or headache.    blood glucose meter kit and supplies Dispense based on patient and insurance preference. Use up to four times daily as directed. (FOR ICD-10 E10.9, E11.9).    cetirizine (ZYRTEC) 10 MG tablet Take 10 mg by mouth daily as needed for allergies.    Cholecalciferol (VITAMIN D) 2000 units  tablet Take 2,000 Units by mouth daily.    dapagliflozin propanediol (FARXIGA) 5 MG TABS tablet Every other day for 14 days,  then daily    ELIQUIS 5 MG TABS tablet Take 1 tablet (5 mg total) by mouth 2 (two) times daily.    estradiol (ESTRACE) 0.1 MG/GM vaginal cream Place 1 g vaginally 3 (three) times a week. Pea sized amount per urethra    furosemide (LASIX) 20 MG tablet Take 40 mg by mouth daily. 10/24/2021: REPORTS TAKING DAILY   losartan (COZAAR) 25 MG tablet Take 1 tablet (25 mg total) by mouth daily.    metoprolol succinate (TOPROL-XL) 25 MG 24 hr tablet Take 12.5 mg by mouth daily.    potassium chloride SA (KLOR-CON M) 20 MEQ tablet Take 1 tablet (20 mEq total) by mouth daily.    traZODone (DESYREL) 50 MG tablet Take 50 mg by mouth at bedtime.    No facility-administered encounter medications on file as of 11/28/2021.    Patient Active Problem List   Diagnosis Date Noted   Valvular cardiomyopathy ([place]) 10/22/2021   Varicose veins of both lower extremities 07/13/2021   Injury of right leg 07/06/2021   Right leg pain 07/06/2021   Callus of foot 07/06/2021   Proximal muscle weakness 08/24/2020   Mitral valve disease 07/09/2020   Fatigue 05/29/2020   Diuretic-induced hypokalemia 12/16/2019   Myalgia due to statin 12/15/2019   Acquired thrombophilia ([place]) 12/15/2019   Skin lesion 08/27/2018   Hospital discharge follow-up 05/25/2018   History of CVA (cerebrovascular  accident) without residual deficits 05/10/2018   Parent-child estrangement Delma Post 02/09/2018   Basal cell carcinoma (BCC) of right lower extremity 08/08/2017   Hematuria, gross 08/08/2017   Extremity atherosclerosis with intermittent claudication ([place]) 08/08/2017   Generalized anxiety disorder 01/31/2017   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   Controlled type 2 diabetes mellitus with microalbuminuria, without long-term current use of insulin ([place]) 01/20/2016   Insomnia secondary to anxiety 10/09/2015   Polycythemia,  secondary 07/04/2015   Pre-syncope 07/02/2015   Osteopenia 08/21/2014   Gastritis 05/26/2014   GERD (gastroesophageal reflux disease) 03/04/2014   Medicare annual wellness visit, subsequent 01/13/2014   Essential hypertension 11/04/2013   Screening for breast cancer 07/01/2013   Left shoulder pain 03/21/2013   Hoarseness of voice 03/21/2013   Edema 09/05/2010   VENTRICULAR HYPERTROPHY, LEFT 08/16/2010   Hyperlipidemia 06/03/2010   ATRIAL FIBRILLATION 08/13/2009    Conditions to be addressed/monitored:CHF, HTN, and DMII  Care Plan : Heart Failure  Updates made by Leona Singleton, RN since 11/28/2021 12:00 AM     Problem: Heart Failure (increasing edema and shortness of breath   Priority: Medium     Long-Range Goal: Symptom Exacerbation Prevented or Minimized   Start Date: 08/25/2020  Expected End Date: 06/17/2021  Recent Progress: Not on track  Priority: Medium  Note:   Current Barriers:   Knowledge deficits related to basic heart failure pathophysiology and self care management as evidenced by increasing shortness of breath with exertion and increase in lower extremity edema.  Unable to perform ADLs independently Unable to perform IADLs independently Financial strain-patient reporting her Eliquis copay has increased to $100 monthly.   Patient reporting being real busy with multiple doctor appointments.  Has Dermatology appointment for hematoma/sore on leg.  Reporting she had periods light headiness and dizziness feeling faint.  Blood pressures have been elevated.  Instructed to take Lasix for a week and to start on Toprol XL.  States yesterday was first day of taking Toprol.  BP 166/99 today.  Does state she feels better today than the past few days.  Denies shortness of breath or swelling in lower extremities.  Weight this morning 162. 2/17--Reporting she does not feel well related to labile blood pressures.  States BP continues to range 160/90-100's.  Reports compliance with  medications even Toprol.  Denies chest pains or increase in lower extremity edema; does admit to shortness of breath with exesion and having periods of lightheadedness.  Leg wound biopsy + for skin CA per patient (awaiting plan).  Relieved that her house has been sold.  Weight this morning 162 pounds.  Continues to adjust to [place]. 3/17--did not sleep well last night but states she feels a little better.  Continues to have some shortness of breath and elevated blood pressures.  Does report not taking lasix due to "not having fluid in extremities".  Discussed heart failure and s/s of fluid  build up in lungs and encouraged patient to take lasix, weight 166 pounds, BP continues to be 160-170/90's 5/8--states she feels much better.  Reports very little ankle swelling, SOB with exertion only.  Taking lasix daily now.  Has not been checking BP at home lately (high readings upsetting to her) but recent provider appointments pressures has been much better.  Acknowledges increase in A1C and discussed changes in eating habits.  Weight 165.5.  leg wound healing; left hand biopsy site still open with little drainage, completed antibiotics and changes dressing. 6/12--reports continued SOB with exersion, little swelling in ankles.  Denies chest pains or dizziness.  Weight 167 pounds.  Right leg wound healed and left hand wound healing.  States blood pressures have been better.  A little apprehensive starting Farxiga, discussed medication and benefits.  Encouraged to discuss with daughter.  Wants to learn how to check blood sugars, awaiting return call from provider.  Nurse Case Manager Clinical Goal(s):  Patient will report no hospitalization related to heart failure exacerbation within the next 90 days. patient will weigh self daily and record patient will verbalize understanding of Heart Failure Action Plan and when to call doctor patient will take all Heart Failure mediations as prescribed Patient will continue to  monitor heart rates and notify provider if elevated heart rates sustains   Heart Failure Interventions:   Goal on rack (progressing): yes Collaboration with Crecencio Mc, MD regarding development and update of comprehensive plan of care as evidenced by provider attestation and co-signature Inter-disciplinary care team collaboration (see longitudinal plan of care) Basic overview and discussion of pathophysiology of Heart Failure Reviewed Heart Failure Action Plan in depth and provided written copy; encouraged patient to review Encouraged continued daily weight monitoring, documenting in log for provider review Reviewed medications and encouraged medication compliance;  Reviewed role of diuretics in prevention of fluid overload and dosage of Lasix (patient verbalized her understanding and states taking 1 tablets daily)                                                                                                                                                                                                     Self-awareness of signs/symptoms of worsening disease encouraged Fall precautions and preventions reviewed and discussed Provided patient emotional support and empathy Discussed benefits of Farxiga related to heart disease, encouraged to discuss with daughter Discussed asking nurses at facility to monitor blood sugars a few times a week keeping log for providers Encouraged scheduling eye exam  A-fib:  (Status: Goal on Track (progressing): YES. Condition stable. Not addressed this visit.) Long Term Goal  Counseled on increased risk of stroke due to Afib and benefits of anticoagulation for stroke prevention           Reviewed importance of adherence to anticoagulant exactly as prescribed Counseled on seeking medical attention after a head injury or if there is blood in the urine/stool Afib action plan reviewed   Hypertension: (Status: Goal on Track (progressing): YES.)  Long Term Goal   Last practice recorded BP readings:  BP Readings from Last 3 Encounters:  11/24/21 (!) 152/88  10/19/21 136/82  07/21/21 (!) 156/84  Most recent GFR/CrCl:  No results found for: "EGFR"  No components found for: "CRCL"  Evaluation of current treatment plan related to hypertension self management and patient's adherence to plan as established by provider;   Reviewed prescribed diet low salt Reviewed medications with patient and discussed importance of compliance;  Discussed plans with patient for ongoing care management follow up and provided patient with direct contact information for care management team; Advised patient, providing education and rationale, to monitor blood pressure daily and record, calling PCP for findings outside established parameters;  Reviewed scheduled/upcoming provider appointments including:  Advised patient to discuss hematoma to back [id number] with provider;  Encouraged to monitor BP at least weekly to monitor for hypertension and notify Cardiologist if BP still elevated  Discussed relaxation tips and encouraged patient to try  Patient Goals/Self-Care Activities:  Call office if I gain more than 2 pounds in one day or 5 pounds in one week Keep legs up/elevated while sitting and wear support hose Watch for swelling in feet, ankles and legs every day Weigh myself daily and keep in log to take to provider for review Follow rescue plan and know when to call the doctor Continue to monitor blood pressures daily taking log to provider review/monitoring for light headiness Check blood pressures weekly and notify providers for sustained elevations Try to stay calm and relax TAKE LASIX AND FARXIGA AS PRESCRIBED Schedule eye exam  Follow Up Plan: The care management team will reach out to the patient again over the next 45 business days.        Plan:The care management team will reach out to the patient again over the next 45 days.  FHubert AzureRN, MSN RN Care  Management Coordinator [email or web address]

## 2021-11-29 ENCOUNTER — Other Ambulatory Visit: Payer: Self-pay

## 2021-11-29 MED ORDER — ONETOUCH VERIO W/DEVICE KIT: PACK | 0 refills | Status: DC

## 2021-11-29 MED ORDER — ONETOUCH ULTRASOFT LANCETS MISC
0 refills | Status: DC
Start: 2021-11-29 — End: 2022-02-09

## 2021-11-29 MED ORDER — GLUCOSE BLOOD VI STRP: ORAL_STRIP | 0 refills | Status: DC

## 2021-11-29 NOTE — Telephone Encounter (Signed)
New diabetes supplies has been sent to [facility] that should be covered by her insurance.

## 2021-11-30 NOTE — Telephone Encounter (Signed)
Pt called in stating that she had called 3 times before and no one returned her call... Pt stated that she went to [facility] and they advised her that her insurance will not cover her machine... Pt was wondering if she can pay out of her pocket for the machine... Pt requesting callback.Marland KitchenMarland Kitchen

## 2021-11-30 NOTE — Telephone Encounter (Signed)
Spoke with pt and advised her that I would call and speak with someone at [facility] to see what is going on with the glucometer tomorrow. Pt gave a verbal understanding.

## 2021-12-05 NOTE — Telephone Encounter (Signed)
Spoke with pt to let her know that I spoke with [facility] and they transferred her rx to [facility] near [place] because they are unable to run her part b insurance. Pt stated that she will get it picked up later this week when her daughter comes into to town.

## 2021-12-12 ENCOUNTER — Ambulatory Visit (INDEPENDENT_AMBULATORY_CARE_PROVIDER_SITE_OTHER): Payer: Medicare Other | Admitting: Dermatology

## 2021-12-12 DIAGNOSIS — L820 Inflamed seborrheic keratosis: Secondary | ICD-10-CM | POA: Diagnosis not present

## 2021-12-12 DIAGNOSIS — L821 Other seborrheic keratosis: Secondary | ICD-10-CM

## 2021-12-12 DIAGNOSIS — I70219 Atherosclerosis of native arteries of extremities with intermittent claudication, unspecified extremity: Secondary | ICD-10-CM | POA: Diagnosis not present

## 2021-12-12 DIAGNOSIS — Z85828 Personal history of other malignant neoplasm of skin: Secondary | ICD-10-CM

## 2021-12-12 DIAGNOSIS — L578 Other skin changes due to chronic exposure to nonionizing radiation: Secondary | ICD-10-CM | POA: Diagnosis not present

## 2021-12-12 DIAGNOSIS — Z872 Personal history of diseases of the skin and subcutaneous tissue: Secondary | ICD-10-CM

## 2021-12-12 MED ORDER — MOMETASONE FUROATE 0.1 % EX CREA: TOPICAL_CREAM | CUTANEOUS | 0 refills | Status: DC

## 2021-12-16 DIAGNOSIS — I4891 Unspecified atrial fibrillation: Secondary | ICD-10-CM | POA: Diagnosis not present

## 2021-12-16 DIAGNOSIS — I509 Heart failure, unspecified: Secondary | ICD-10-CM

## 2021-12-16 DIAGNOSIS — I110 Hypertensive heart disease with heart failure: Secondary | ICD-10-CM

## 2021-12-28 ENCOUNTER — Telehealth: Payer: Medicare Other

## 2021-12-28 ENCOUNTER — Telehealth: Payer: Self-pay | Admitting: *Deleted

## 2021-12-28 NOTE — Telephone Encounter (Signed)
  Care Management   Follow Up Note   12/28/2021 Name: Linda [name] MRN: [id number] DOB: [birth year]   Referred by: Crecencio Mc, MD Reason for referral : Chronic Care Management (CHF, HTN)   An unsuccessful telephone outreach was attempted today. The patient was referred to the case management team for assistance with care management and care coordination.   Follow Up Plan: The care management team will reach out to the patient again over the next 30 days.   [name] RN, MSN RN Care Management Coordinator [place] [phone] [email or web address]

## 2022-01-02 ENCOUNTER — Ambulatory Visit: Payer: Medicare Other | Admitting: *Deleted

## 2022-01-02 DIAGNOSIS — I10 Essential (primary) hypertension: Secondary | ICD-10-CM

## 2022-01-02 DIAGNOSIS — E1129 Type 2 diabetes mellitus with other diabetic kidney complication: Secondary | ICD-10-CM

## 2022-01-02 NOTE — Chronic Care Management (AMB) (Signed)
  Care Management   Follow Up Note   01/02/2022 Name: Linda [name] MRN: [id number] DOB: 1934-01-09   Referred by: Crecencio Mc, MD Reason for referral : Case Closure   Successful outreach to patient.  States she is doing well without complaints.  Discussed goals and both agree patient has met goals of the program.  Follow Up Plan: The patient has been provided with contact information for the care management team and has been advised to call with any health-related questions or concerns.  No further follow up required: as personal goals have been met.  [name] RN, MSN RN Care Management Coordinator [place] [phone] [email or web address]

## 2022-01-02 NOTE — Patient Instructions (Signed)
CONGRATULATIONS ON COMPLETING YOUR GOALS.  IT AS BEEN A PLEASURE WORKING WITH AND TALKING TO YOU.  IF  NEEDS [place] IN THE FUTURE PLEASE DO NOT HESITATE TO CONTACT ME  [phone]  Marcelus Dubberly RN, MSN RN Care Management Coordinator [facility] [phone] [email or web address]  

## 2022-02-04 ENCOUNTER — Observation Stay (HOSPITAL_BASED_OUTPATIENT_CLINIC_OR_DEPARTMENT_OTHER)
Admission: EM | Admit: 2022-02-04 | Discharge: 2022-02-06 | Disposition: A | Discharge status: Home / Self Care | Payer: Medicare Other | Source: Home / Self Care | Attending: Emergency Medicine | Admitting: Emergency Medicine

## 2022-02-04 DIAGNOSIS — Z85828 Personal history of other malignant neoplasm of skin: Secondary | ICD-10-CM | POA: Insufficient documentation

## 2022-02-04 DIAGNOSIS — I4891 Unspecified atrial fibrillation: Secondary | ICD-10-CM | POA: Insufficient documentation

## 2022-02-04 DIAGNOSIS — R809 Proteinuria, unspecified: Secondary | ICD-10-CM | POA: Insufficient documentation

## 2022-02-04 DIAGNOSIS — R0902 Hypoxemia: Secondary | ICD-10-CM | POA: Diagnosis present

## 2022-02-04 DIAGNOSIS — I110 Hypertensive heart disease with heart failure: Secondary | ICD-10-CM | POA: Insufficient documentation

## 2022-02-04 DIAGNOSIS — R040 Epistaxis: Secondary | ICD-10-CM

## 2022-02-04 DIAGNOSIS — Z8673 Personal history of transient ischemic attack (TIA), and cerebral infarction without residual deficits: Secondary | ICD-10-CM

## 2022-02-04 DIAGNOSIS — I5020 Unspecified systolic (congestive) heart failure: Secondary | ICD-10-CM

## 2022-02-04 DIAGNOSIS — Z79899 Other long term (current) drug therapy: Secondary | ICD-10-CM | POA: Insufficient documentation

## 2022-02-04 DIAGNOSIS — Z20822 Contact with and (suspected) exposure to covid-19: Secondary | ICD-10-CM | POA: Insufficient documentation

## 2022-02-04 DIAGNOSIS — I5030 Unspecified diastolic (congestive) heart failure: Secondary | ICD-10-CM | POA: Insufficient documentation

## 2022-02-04 DIAGNOSIS — I480 Paroxysmal atrial fibrillation: Secondary | ICD-10-CM | POA: Insufficient documentation

## 2022-02-04 DIAGNOSIS — I4729 Other ventricular tachycardia: Secondary | ICD-10-CM

## 2022-02-04 DIAGNOSIS — R531 Weakness: Secondary | ICD-10-CM

## 2022-02-04 DIAGNOSIS — R9389 Abnormal findings on diagnostic imaging of other specified body structures: Secondary | ICD-10-CM

## 2022-02-04 DIAGNOSIS — Z7901 Long term (current) use of anticoagulants: Secondary | ICD-10-CM

## 2022-02-04 DIAGNOSIS — I272 Pulmonary hypertension, unspecified: Secondary | ICD-10-CM

## 2022-02-04 DIAGNOSIS — E1129 Type 2 diabetes mellitus with other diabetic kidney complication: Secondary | ICD-10-CM | POA: Diagnosis present

## 2022-02-04 DIAGNOSIS — I10 Essential (primary) hypertension: Secondary | ICD-10-CM | POA: Diagnosis present

## 2022-02-04 DIAGNOSIS — I340 Nonrheumatic mitral (valve) insufficiency: Secondary | ICD-10-CM

## 2022-02-04 NOTE — ED Triage Notes (Signed)
From [facility] via [facility] for nosebleed x 41mn, on eliquis. Bleeding controlled with EMS. Afrinx2 and nose clamp pta. EMS V/S: 208SBP, takes meds for HTN.  98%RA, 88bpm, afib (h/o same) Denies N/V No trauma. DNR yellow form with pt

## 2022-02-05 ENCOUNTER — Emergency Department: Payer: Medicare Other

## 2022-02-05 ENCOUNTER — Other Ambulatory Visit: Payer: Self-pay

## 2022-02-05 ENCOUNTER — Encounter: Payer: Self-pay | Admitting: Emergency Medicine

## 2022-02-05 DIAGNOSIS — E1129 Type 2 diabetes mellitus with other diabetic kidney complication: Secondary | ICD-10-CM

## 2022-02-05 DIAGNOSIS — Z7901 Long term (current) use of anticoagulants: Secondary | ICD-10-CM

## 2022-02-05 DIAGNOSIS — I272 Pulmonary hypertension, unspecified: Secondary | ICD-10-CM

## 2022-02-05 DIAGNOSIS — R040 Epistaxis: Secondary | ICD-10-CM

## 2022-02-05 DIAGNOSIS — R809 Proteinuria, unspecified: Secondary | ICD-10-CM

## 2022-02-05 DIAGNOSIS — I5020 Unspecified systolic (congestive) heart failure: Secondary | ICD-10-CM

## 2022-02-05 DIAGNOSIS — R0902 Hypoxemia: Secondary | ICD-10-CM | POA: Diagnosis not present

## 2022-02-05 DIAGNOSIS — I340 Nonrheumatic mitral (valve) insufficiency: Secondary | ICD-10-CM

## 2022-02-05 DIAGNOSIS — I10 Essential (primary) hypertension: Secondary | ICD-10-CM

## 2022-02-05 DIAGNOSIS — I4811 Longstanding persistent atrial fibrillation: Secondary | ICD-10-CM

## 2022-02-05 DIAGNOSIS — Z8673 Personal history of transient ischemic attack (TIA), and cerebral infarction without residual deficits: Secondary | ICD-10-CM

## 2022-02-05 DIAGNOSIS — R9389 Abnormal findings on diagnostic imaging of other specified body structures: Secondary | ICD-10-CM

## 2022-02-05 LAB — GASTROINTESTINAL PANEL BY PCR, STOOL (REPLACES STOOL CULTURE)

## 2022-02-05 LAB — PROTIME-INR
INR: 1.6 — ABNORMAL HIGH (ref 0.8–1.2)
Prothrombin Time: 18.7 seconds — ABNORMAL HIGH (ref 11.4–15.2)

## 2022-02-05 LAB — CBC
HCT: 45.2 % (ref 36.0–46.0)
HCT: 47.8 % — ABNORMAL HIGH (ref 36.0–46.0)
Hemoglobin: 14.9 g/dL (ref 12.0–15.0)
Hemoglobin: 15.8 g/dL — ABNORMAL HIGH (ref 12.0–15.0)
MCH: 32.2 pg (ref 26.0–34.0)
MCH: 32.3 pg (ref 26.0–34.0)
MCHC: 33 g/dL (ref 30.0–36.0)
MCHC: 33.1 g/dL (ref 30.0–36.0)
MCV: 97.6 fL (ref 80.0–100.0)
MCV: 97.8 fL (ref 80.0–100.0)
Platelets: 152 10*3/uL (ref 150–400)
Platelets: 156 10*3/uL (ref 150–400)
RBC: 4.62 MIL/uL (ref 3.87–5.11)
RBC: 4.9 MIL/uL (ref 3.87–5.11)
RDW: 13.5 % (ref 11.5–15.5)
RDW: 13.5 % (ref 11.5–15.5)
WBC: 5.1 10*3/uL (ref 4.0–10.5)
WBC: 6.9 10*3/uL (ref 4.0–10.5)
nRBC: 0 % (ref 0.0–0.2)
nRBC: 0 % (ref 0.0–0.2)

## 2022-02-05 LAB — GLUCOSE, CAPILLARY
Glucose-Capillary: 89 mg/dL (ref 70–99)
Glucose-Capillary: 97 mg/dL (ref 70–99)

## 2022-02-05 LAB — C DIFFICILE QUICK SCREEN W PCR REFLEX
C Diff antigen: NEGATIVE
C Diff interpretation: NOT DETECTED
C Diff toxin: NEGATIVE

## 2022-02-05 LAB — BASIC METABOLIC PANEL
Anion gap: 8 (ref 5–15)
BUN: 22 mg/dL (ref 8–23)
CO2: 26 mmol/L (ref 22–32)
Calcium: 9.2 mg/dL (ref 8.9–10.3)
Chloride: 104 mmol/L (ref 98–111)
Creatinine, Ser: 0.66 mg/dL (ref 0.44–1.00)
GFR, Estimated: >60 mL/min (ref >60)
Glucose, Bld: 164 mg/dL — ABNORMAL HIGH (ref 70–99)
Potassium: 3.7 mmol/L (ref 3.5–5.1)
Sodium: 138 mmol/L (ref 135–145)

## 2022-02-05 LAB — LACTIC ACID, PLASMA: Lactic Acid, Venous: 0.9 mmol/L (ref 0.5–1.9)

## 2022-02-05 LAB — PROCALCITONIN: Procalcitonin: <0.1 ng/mL

## 2022-02-05 LAB — CBG MONITORING, ED: Glucose-Capillary: 138 mg/dL — ABNORMAL HIGH (ref 70–99)

## 2022-02-05 LAB — SARS CORONAVIRUS 2 BY RT PCR: SARS Coronavirus 2 by RT PCR: NEGATIVE

## 2022-02-05 LAB — TROPONIN I (HIGH SENSITIVITY): Troponin I (High Sensitivity): 23 ng/L — ABNORMAL HIGH (ref <18)

## 2022-02-05 MED ORDER — HYDRALAZINE HCL 20 MG/ML IJ SOLN
5.0000 mg | Freq: Once | INTRAMUSCULAR | Status: AC
  Administered 2022-02-05: 5 mg via INTRAVENOUS
  Filled 2022-02-05: qty 1

## 2022-02-05 MED ORDER — INSULIN ASPART 100 UNIT/ML IJ SOLN: 0.0000 [IU] | Freq: Every day | INTRAMUSCULAR | Status: DC

## 2022-02-05 MED ORDER — ONDANSETRON HCL 4 MG PO TABS: 4.0000 mg | ORAL_TABLET | Freq: Four times a day (QID) | ORAL | Status: DC | PRN

## 2022-02-05 MED ORDER — METOPROLOL SUCCINATE ER 25 MG PO TB24
12.5000 mg | ORAL_TABLET | Freq: Every day | ORAL | Status: DC
  Administered 2022-02-05 – 2022-02-06 (×2): 12.5 mg via ORAL
  Filled 2022-02-05: qty 1
  Filled 2022-02-05: qty 0.5
  Filled 2022-02-05: qty 1

## 2022-02-05 MED ORDER — POTASSIUM CHLORIDE CRYS ER 20 MEQ PO TBCR
20.0000 meq | EXTENDED_RELEASE_TABLET | Freq: Every day | ORAL | Status: DC
  Administered 2022-02-05: 20 meq via ORAL
  Filled 2022-02-05: qty 1

## 2022-02-05 MED ORDER — SODIUM CHLORIDE 0.9 % IV BOLUS
500.0000 mL | Freq: Once | INTRAVENOUS | Status: AC
  Administered 2022-02-05: 500 mL via INTRAVENOUS

## 2022-02-05 MED ORDER — TRAZODONE HCL 50 MG PO TABS
50.0000 mg | ORAL_TABLET | Freq: Every day | ORAL | Status: DC
  Administered 2022-02-05: 50 mg via ORAL
  Filled 2022-02-05: qty 1

## 2022-02-05 MED ORDER — ACETAMINOPHEN 325 MG PO TABS: 650.0000 mg | ORAL_TABLET | Freq: Four times a day (QID) | ORAL | Status: DC | PRN

## 2022-02-05 MED ORDER — CEPHALEXIN 250 MG PO CAPS
250.0000 mg | ORAL_CAPSULE | Freq: Once | ORAL | Status: AC
  Administered 2022-02-05: 250 mg via ORAL
  Filled 2022-02-05: qty 1

## 2022-02-05 MED ORDER — LOSARTAN POTASSIUM 25 MG PO TABS
25.0000 mg | ORAL_TABLET | Freq: Every day | ORAL | Status: DC
  Administered 2022-02-05 – 2022-02-06 (×2): 25 mg via ORAL
  Filled 2022-02-05 (×2): qty 1

## 2022-02-05 MED ORDER — INSULIN ASPART 100 UNIT/ML IJ SOLN
0.0000 [IU] | Freq: Three times a day (TID) | INTRAMUSCULAR | Status: DC
  Administered 2022-02-05 – 2022-02-06 (×2): 2 [IU] via SUBCUTANEOUS
  Filled 2022-02-05 (×2): qty 1

## 2022-02-05 MED ORDER — CEPHALEXIN 500 MG PO CAPS
500.0000 mg | ORAL_CAPSULE | Freq: Three times a day (TID) | ORAL | Status: DC
  Administered 2022-02-05 – 2022-02-06 (×4): 500 mg via ORAL
  Filled 2022-02-05 (×4): qty 1

## 2022-02-05 MED ORDER — FUROSEMIDE 20 MG PO TABS
20.0000 mg | ORAL_TABLET | Freq: Every day | ORAL | Status: DC
  Administered 2022-02-05 – 2022-02-06 (×2): 20 mg via ORAL
  Filled 2022-02-05 (×2): qty 1

## 2022-02-05 MED ORDER — LORATADINE 10 MG PO TABS
10.0000 mg | ORAL_TABLET | Freq: Every day | ORAL | Status: DC
  Administered 2022-02-05 – 2022-02-06 (×2): 10 mg via ORAL
  Filled 2022-02-05 (×2): qty 1

## 2022-02-05 MED ORDER — ONDANSETRON HCL 4 MG/2ML IJ SOLN: 4.0000 mg | Freq: Four times a day (QID) | INTRAMUSCULAR | Status: DC | PRN

## 2022-02-05 MED ORDER — DAPAGLIFLOZIN PROPANEDIOL 10 MG PO TABS
10.0000 mg | ORAL_TABLET | Freq: Every day | ORAL | Status: DC
  Filled 2022-02-05: qty 1

## 2022-02-05 MED ORDER — ACETAMINOPHEN 650 MG RE SUPP: 650.0000 mg | Freq: Four times a day (QID) | RECTAL | Status: DC | PRN

## 2022-02-05 NOTE — Progress Notes (Signed)
Progress Note   Patient: Linda [name] JSE:[id number] DOB: 1934/06/05 DOA: 02/04/2022     0 DOS: the patient was seen and examined on 02/05/2022     Assessment and Plan: * Epistaxis Chronic anticoagulation on apixaban Hypoxia Packed in ER.  Some blood dripping through the packing.  Continue to hold Eliquis and continue to control blood pressure.  Case discussed with ENT and hopefully bleeding will stop with holding Eliquis and controlling blood pressure.  If severe bleeding may need to be repacked.  Hypoxia 1 pulse ox of 88% on room air.  Last pulse ox patient is off oxygen.  Chronic anticoagulation Need to hold Eliquis with bleeding through the packing.  Must weigh benefits and risks of anticoagulation.  Abnormal chest x-ray Possible pneumonia seen on chest x-ray likely secondary to blood.  Procalcitonin negative.  Holding off antibiotics for pneumonia but on empiric Keflex for nasal packing.  Pulmonary hypertension (HCC) Followed at [facility]  HFrEF (heart failure with reduced ejection fraction) (HCC) No signs of heart failure.  Continue furosemide losartan and metoprolol  Severe mitral regurgitation s/p MitraClip June 2022 Followed at [facility]  History of CVA (cerebrovascular accident) without residual deficits Holding Eliquis with bleeding through the packing.  Controlled type 2 diabetes mellitus with microalbuminuria, without long-term current use of insulin (HCC) Continue Farxiga Sliding scale insulin coverage  Essential hypertension Continue losartan, metoprolol and furosemide  ATRIAL FIBRILLATION Chronic in nature.  With bleeding through the packing we will hold Eliquis. Continue metoprolol        Subjective: Patient had a nosebleed that required packing in the emergency room.  Still having a little dripping coming from the packing.  Eliquis on hold.  Case discussed with ENT and recommended keeping blood pressure under control and holding Eliquis at this point.   Packing will have to stay in for at least 5 days.  Physical Exam: Vitals:   02/05/22 0730 02/05/22 0856 02/05/22 1000 02/05/22 1025  BP: (!) 160/90 137/66    Pulse: 83 68    Resp: 20 (!) 22    Temp: 97.9 F (36.6 C) 98.1 F (36.7 C)    TempSrc: Oral     SpO2: 96% 100%  95%  Weight:   78.2 kg    Physical Exam HENT:     Head: Normocephalic.     Nose:     Comments: Right nostril packed    Mouth/Throat:     Pharynx: No oropharyngeal exudate.  Eyes:     General: Lids are normal.     Conjunctiva/sclera: Conjunctivae normal.  Cardiovascular:     Rate and Rhythm: Normal rate and regular rhythm.     Heart sounds: S1 normal and S2 normal. Murmur heard.     Systolic murmur is present with a grade of 3/6.  Pulmonary:     Breath sounds: No decreased breath sounds, wheezing, rhonchi or rales.  Abdominal:     Palpations: Abdomen is soft.     Tenderness: There is no abdominal tenderness.  Musculoskeletal:     Right lower leg: Swelling present.     Left lower leg: Swelling present.  Skin:    General: Skin is warm.     Comments: Chronic lower extremity skin discoloration  Neurological:     Mental Status: She is alert and oriented to person, place, and time.     Data Reviewed: Last hemoglobin 14.9  Family Communication: Spoke with son on the phone  Disposition: Status is: Observation With bleeding through the packing  will have to continue to monitor until this stops.  Continue to hold Eliquis.  Planned Discharge Destination: Home    Time spent: 30 minutes Case discussed with ENT.  Author: Loletha Grayer, MD 02/05/2022 3:18 PM  For on call review [email or web address].

## 2022-02-05 NOTE — Progress Notes (Signed)
End of shift note:  Pt was admitted to the floor on 2L Linda [name] but she is now on RA. Blood was dripping through nasal pack. ENT on call was consulted and suggested to applied gauze/tape.  Family was updated about the plan of care. For now we are holding eliquis and packing should stay in place for 5 days but in the case of uncontrolled bleeding the nare may need to be repacker- per ENT's order.

## 2022-02-05 NOTE — TOC Initial Note (Signed)
Transition of Care [facility]) - Initial/Assessment Note    Patient Details  Name: Linda [name] MRN: [id number] Date of Birth: [birth year]  [facility] CM/SW Contact:    Beverly Sessions, RN Phone Number: 02/05/2022, 3:11 PM  Clinical Narrative:                      Admitted for: Nose bleed Admitted from: [place] independent living PCP: Derrel Nip   Daughter in Sports coach at bedside.  Per Daughter in law patient will need transportation back to independent living tomorrow at discharge.  TOC to follow up with [name] at [facility] tomorrow.  Patient weaned to RA     Patient Goals and CMS Choice        Expected Discharge Plan and Services                                                Prior Living Arrangements/Services                       Activities of Daily Living Home Assistive Devices/Equipment: Gilford Rile (specify type) ADL Screening (condition at time of admission) Patient's cognitive ability adequate to safely complete daily activities?: Yes Is the patient deaf or have difficulty hearing?: Yes Does the patient have difficulty seeing, even when wearing glasses/contacts?: No Does the patient have difficulty concentrating, remembering, or making decisions?: No Patient able to express need for assistance with ADLs?: Yes Does the patient have difficulty dressing or bathing?: No Independently performs ADLs?: Yes (appropriate for developmental age) Does the patient have difficulty walking or climbing stairs?: No Weakness of Legs: None Weakness of Arms/Hands: None  Permission Sought/Granted                  Emotional Assessment              Admission diagnosis:  Weakness generalized [R53.1] Hypoxia [R09.02] Right-sided epistaxis [R04.0] Patient Active Problem List   Diagnosis Date Noted   Hypoxia 02/05/2022   Chronic anticoagulation 02/05/2022   HFrEF (heart failure with reduced ejection fraction) ([place])  02/05/2022   Pulmonary hypertension ([place]) 02/05/2022   Valvular cardiomyopathy ([place]) 10/22/2021   S/P mitral valve clip implantation 10/05/2021   Varicose veins of both lower extremities 07/13/2021   Injury of right leg 07/06/2021   Right leg pain 07/06/2021   Callus of foot 07/06/2021   Proximal muscle weakness 08/24/2020   Severe mitral regurgitation s/p MitraClip June 2022 07/09/2020   Fatigue 05/29/2020   Diuretic-induced hypokalemia 12/16/2019   Myalgia due to statin 12/15/2019   Acquired thrombophilia ([place]) 12/15/2019   Skin lesion 08/27/2018   Hospital discharge follow-up 05/25/2018   Abnormal chest x-ray 05/25/2018   History of CVA (cerebrovascular accident) without residual deficits 05/10/2018   Parent-child estrangement nec 02/09/2018   Basal cell carcinoma (BCC) of right lower extremity 08/08/2017   Hematuria, gross 08/08/2017   Extremity atherosclerosis with intermittent claudication ([place]) 08/08/2017   Generalized anxiety disorder 01/31/2017   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   Controlled type 2 diabetes mellitus with microalbuminuria, without long-term current use of insulin ([place]) 01/20/2016   Insomnia secondary to anxiety 10/09/2015   Polycythemia, secondary 07/04/2015   Pre-syncope 07/02/2015   Osteopenia 08/21/2014   Gastritis 05/26/2014   Epistaxis 03/18/2014   GERD (gastroesophageal reflux disease) 03/04/2014  Medicare annual wellness visit, subsequent 01/13/2014   Essential hypertension 11/04/2013   Screening for breast cancer 07/01/2013   Left shoulder pain 03/21/2013   Hoarseness of voice 03/21/2013   Edema 09/05/2010   VENTRICULAR HYPERTROPHY, LEFT 08/16/2010   Hyperlipidemia 06/03/2010   ATRIAL FIBRILLATION 08/13/2009   PCP:  Crecencio Mc, MD Pharmacy:   [place], [place] - [address] [place] [address] Phone: [phone] Fax: [phone]     Social Determinants of Health (SDOH)  Interventions    Readmission Risk Interventions     No data to display

## 2022-02-05 NOTE — Assessment & Plan Note (Signed)
Continue Farxiga Sliding scale insulin coverage

## 2022-02-05 NOTE — Assessment & Plan Note (Addendum)
No signs of heart failure.  Continue furosemide losartan and metoprolol

## 2022-02-05 NOTE — Assessment & Plan Note (Signed)
1 pulse ox of 88% on room air.  Last pulse ox patient is off oxygen.

## 2022-02-05 NOTE — Assessment & Plan Note (Addendum)
Possible pneumonia seen on chest x-ray likely secondary to blood.  Procalcitonin negative.  Holding off antibiotics for pneumonia but on empiric Keflex for nasal packing.

## 2022-02-05 NOTE — ED Notes (Signed)
Patient ambulated to toilet with one person assist and steady gait.

## 2022-02-05 NOTE — ED Provider Notes (Signed)
[facility] Provider Note    Event Date/Time   First MD Initiated Contact with Patient 02/04/22 2356     (approximate)   History   Epistaxis   HPI  Linda [name] is a 88 y.o. female brought to the ED via EMS from [place] with a chief complaint of nosebleed.  Patient with a history of atrial fibrillation on Eliquis with a history of nosebleeds; right-sided nosebleed for 30 minutes prior to arrival.  Arrives to the ED status post 2 sprays of Afrin and nasal clamp.  Endorses feeling generally weak.  Denies headache, chest pain, shortness of breath, abdominal pain, nausea, vomiting or dizziness.     Past Medical History   Past Medical History:  Diagnosis Date   Anxiety    Arrhythmia    paroxysmal atrial fibrillation   CHF (congestive heart failure) (HCC)    Colon polyp    Diverticulitis    Dysrhythmia    GERD (gastroesophageal reflux disease)    Hard of hearing    Hoarseness    Hyperlipidemia    Hypertension    Kidney stone    Motion sickness    Parathyroid disease (HCC)    Parathyroidectomy    PONV (postoperative nausea and vomiting)    Pre-diabetes    PVC (premature ventricular contraction)    Skin cancer    Squamous cell carcinoma of skin 07/19/2021   right lower pretibia lateral, EDC   Squamous cell carcinoma of skin 10/13/2014   R ant neck - KA pattern   Squamous cell carcinoma of skin 09/15/2021   Left dorsal hand, shaved down to fat at time of biopsy so should already be removed.  Will observe for recurrence.   Stroke [facility])    TIA (transient ischemic attack) 04/2018   no deficitis   UTI (lower urinary tract infection)    Varicose veins of both lower extremities    Wears hearing aid in both ears      Active Problem List   Patient Active Problem List   Diagnosis Date Noted   Hypoxia 02/05/2022   Chronic anticoagulation 02/05/2022   HFrEF (heart failure with reduced ejection fraction) ([place]) 02/05/2022   Pulmonary  hypertension ([place]) 02/05/2022   Valvular cardiomyopathy ([place]) 10/22/2021   S/P mitral valve clip implantation 10/05/2021   Varicose veins of both lower extremities 07/13/2021   Injury of right leg 07/06/2021   Right leg pain 07/06/2021   Callus of foot 07/06/2021   Proximal muscle weakness 08/24/2020   Severe mitral regurgitation s/p MitraClip June 2022 07/09/2020   Fatigue 05/29/2020   Diuretic-induced hypokalemia 12/16/2019   Myalgia due to statin 12/15/2019   Acquired thrombophilia ([place]) 12/15/2019   Skin lesion 08/27/2018   Hospital discharge follow-up 05/25/2018   Abnormal chest x-ray 05/25/2018   History of CVA (cerebrovascular accident) without residual deficits 05/10/2018   Parent-child estrangement nec 02/09/2018   Basal cell carcinoma (BCC) of right lower extremity 08/08/2017   Hematuria, gross 08/08/2017   Extremity atherosclerosis with intermittent claudication ([place]) 08/08/2017   Generalized anxiety disorder 01/31/2017   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   Controlled type 2 diabetes mellitus with microalbuminuria, without long-term current use of insulin ([place]) 01/20/2016   Insomnia secondary to anxiety 10/09/2015   Polycythemia, secondary 07/04/2015   Pre-syncope 07/02/2015   Osteopenia 08/21/2014   Gastritis 05/26/2014   Epistaxis 03/18/2014   GERD (gastroesophageal reflux disease) 03/04/2014   Medicare annual wellness visit, subsequent 01/13/2014   Essential hypertension  11/04/2013   Screening for breast cancer 07/01/2013   Left shoulder pain 03/21/2013   Hoarseness of voice 03/21/2013   Edema 09/05/2010   VENTRICULAR HYPERTROPHY, LEFT 08/16/2010   Hyperlipidemia 06/03/2010   ATRIAL FIBRILLATION 08/13/2009     Past Surgical History   Past Surgical History:  Procedure Laterality Date   ABDOMINAL HYSTERECTOMY     APPENDECTOMY     CATARACT EXTRACTION W/PHACO Left 03/05/2019   Procedure: CATARACT EXTRACTION PHACO AND INTRAOCULAR LENS PLACEMENT (IOC)   0:57  15.6% 9.05;  Surgeon: Leandrew Koyanagi, MD;  Location: [place];  Service: Ophthalmology;  Laterality: Left;  Diabetic - diet cotrolled   CATARACT EXTRACTION W/PHACO Right 04/09/2019   Procedure: CATARACT EXTRACTION PHACO AND INTRAOCULAR LENS PLACEMENT (IOC) RIGHT DIABETIC 00:47.6  15.9%  7.60;  Surgeon: Leandrew Koyanagi, MD;  Location: [place];  Service: Ophthalmology;  Laterality: Right;   CHOLECYSTECTOMY  2002   ESOPHAGOGASTRODUODENOSCOPY (EGD) WITH PROPOFOL N/A 12/17/2019   Procedure: ESOPHAGOGASTRODUODENOSCOPY (EGD) WITH PROPOFOL;  Surgeon: Robert Bellow, MD;  Location: [facility];  Service: Endoscopy;  Laterality: N/A;   LITHOTRIPSY     MITRAL VALVE REPAIR  12/14/2020   PARATHYROIDECTOMY  2004   1 removed   ROTATOR CUFF REPAIR  2002   TEE WITHOUT CARDIOVERSION N/A 09/29/2020   Procedure: TRANSESOPHAGEAL ECHOCARDIOGRAM (TEE);  Surgeon: Teodoro Spray, MD;  Location: [facility];  Service: Cardiovascular;  Laterality: N/A;   TONSILLECTOMY  1956   TONSILLECTOMY     TOTAL ABDOMINAL HYSTERECTOMY W/ BILATERAL SALPINGOOPHORECTOMY  1984   VEIN LIGATION AND STRIPPING       Home Medications   Prior to Admission medications   Medication Sig Start Date End Date Taking? Authorizing Provider  acetaminophen (TYLENOL) 500 MG tablet Take 500 mg by mouth every 6 (six) hours as needed for moderate pain or headache.    [provider]  blood glucose meter kit and supplies Dispense based on patient and insurance preference. Use up to four times daily as directed. (FOR ICD-10 E10.9, E11.9). 11/24/21   Crecencio Mc, MD  Blood Glucose Monitoring Suppl (ONETOUCH VERIO) w/Device KIT Use to check blood sugars up to 4 times daily. ICD 10: E11.29 11/29/21   Crecencio Mc, MD  cetirizine (ZYRTEC) 10 MG tablet Take 10 mg by mouth daily as needed for allergies.    [provider]  Cholecalciferol (VITAMIN D) 2000 units tablet Take 2,000 Units by mouth  daily.    [provider]  dapagliflozin propanediol (FARXIGA) 5 MG TABS tablet Every other day for 14 days,  then daily 11/24/21   Crecencio Mc, MD  ELIQUIS 5 MG TABS tablet Take 1 tablet (5 mg total) by mouth 2 (two) times daily. 12/20/19   Robert Bellow, MD  estradiol (ESTRACE) 0.1 MG/GM vaginal cream Place 1 g vaginally 3 (three) times a week. Pea sized amount per urethra Patient not taking: Reported on 02/05/2022 06/23/20   Crecencio Mc, MD  furosemide (LASIX) 20 MG tablet Take 20 mg by mouth daily. 01/07/20   [provider]  glucose blood test strip Use to check blood sugars up to 4 times daily. ICD 10: E11.29 11/29/21   Crecencio Mc, MD  Lancets [facility] ULTRASOFT) lancets Use to check blood sugars up to 4 times daily. ICD-10: E11.29 11/29/21   Crecencio Mc, MD  losartan (COZAAR) 25 MG tablet Take 1 tablet (25 mg total) by mouth daily. 07/21/21   Crecencio Mc, MD  metoprolol succinate (TOPROL-XL) 25 MG 24 hr tablet Take 12.5 mg by mouth daily. 07/14/21   [provider]  mometasone (ELOCON) 0.1 % cream Apply twice daily as needed to itchy spots at body. Avoid applying to face, groin, and axilla. Use as directed. Long-term use can cause thinning of the skin. 12/12/21   Brendolyn Patty, MD  potassium chloride SA (KLOR-CON M) 20 MEQ tablet Take 1 tablet (20 mEq total) by mouth daily. 10/22/21   Crecencio Mc, MD  traZODone (DESYREL) 50 MG tablet Take 50 mg by mouth at bedtime. 05/05/20   [provider]     Allergies  Atorvastatin, Barium iodide, Statins, Crestor [rosuvastatin calcium], Penicillins, Sulfa antibiotics, and Sulfonamide derivatives   Family History   Family History  Problem Relation Age of Onset   Heart disease Brother    Stroke Mother    Skin cancer Father    Breast cancer Paternal Grandmother      Physical Exam  Triage Vital Signs: ED Triage Vitals  Enc Vitals Group     BP 02/05/22 0004 (!) 160/88     Pulse Rate  02/05/22 0004 73     Resp 02/05/22 0004 20     Temp 02/05/22 0004 98.2 F (36.8 C)     Temp src --      SpO2 02/05/22 0004 93 %     Weight --      Height --      Head Circumference --      Peak Flow --      Pain Score 02/05/22 0002 0     Pain Loc --      Pain Edu? --      Excl. in [place]? --     Updated Vital Signs: BP (!) 155/114   Pulse (!) 38   Temp 98.2 F (36.8 C)   Resp (!) 23   SpO2 97%    General: Awake, mild distress.  CV:  Regular rate, irregular rhythm.  Good peripheral perfusion.  Resp:  Normal effort.  CTA B. Abd:  Nontender.  No distention.  Other:  Right anterior nosebleed.  Spitting up clots.   ED Results / Procedures / Treatments  Labs (all labs ordered are listed, but only abnormal results are displayed) Labs Reviewed  CBC - Abnormal; Notable for the following components:      Result Value   Hemoglobin 15.8 (*)    HCT 47.8 (*)    All other components within normal limits  BASIC METABOLIC PANEL - Abnormal; Notable for the following components:   Glucose, Bld 164 (*)    All other components within normal limits  PROTIME-INR - Abnormal; Notable for the following components:   Prothrombin Time 18.7 (*)    INR 1.6 (*)    All other components within normal limits  CBG MONITORING, ED - Abnormal; Notable for the following components:   Glucose-Capillary 138 (*)    All other components within normal limits  TROPONIN I (HIGH SENSITIVITY) - Abnormal; Notable for the following components:   Troponin I (High Sensitivity) 23 (*)    All other components within normal limits  CULTURE, BLOOD (ROUTINE X 2)  CULTURE, BLOOD (ROUTINE X 2)  PROCALCITONIN  LACTIC ACID, PLASMA  LACTIC ACID, PLASMA  CBC     EKG  ED ECG REPORT I, Gervase Colberg J, the attending physician, personally viewed and interpreted this ECG.   Date: 02/05/2022  EKG Time: 0032  Rate: 55  Rhythm: atrial fibrillation, rate 55  Axis: Normal  Intervals:none  ST&T Change:  Nonspecific    RADIOLOGY I have independently visualized and interpreted patient's chest x-ray as well as noted the radiology interpretation:  Chest x-ray: Patchy right mid and lower lung opacities: atelectasis versus pneumonia   Official radiology report(s): DG Chest Port 1 View  Result Date: 02/05/2022 CLINICAL DATA:  Shortness of breath EXAM: PORTABLE CHEST 1 VIEW COMPARISON:  07/13/2021 FINDINGS: Cardiomegaly, vascular congestion. Elevation of the right hemidiaphragm, stable. Patchy right mid lung and lower lobe airspace opacity. No focal opacity on the left. No effusions or acute bony abnormality. IMPRESSION: Patchy right mid and lower lung opacities could reflect atelectasis or pneumonia. Cardiomegaly, vascular congestion. Electronically Signed   By: Rolm Baptise M.D.   On: 02/05/2022 01:29     PROCEDURES:  Critical Care performed: Yes, see critical care procedure note(s)  CRITICAL CARE Performed by: Paulette Blanch   Total critical care time: 45 minutes  Critical care time was exclusive of separately billable procedures and treating other patients.  Critical care was necessary to treat or prevent imminent or life-threatening deterioration.  Critical care was time spent personally by me on the following activities: development of treatment plan with patient and/or surrogate as well as nursing, discussions with consultants, evaluation of patient's response to treatment, examination of patient, obtaining history from patient or surrogate, ordering and performing treatments and interventions, ordering and review of laboratory studies, ordering and review of radiographic studies, pulse oximetry and re-evaluation of patient's condition.   Marland KitchenEpistaxis Management  Date/Time: 02/05/2022 12:11 AM  Performed by: Paulette Blanch, MD Authorized by: Paulette Blanch, MD   Consent:    Consent obtained:  Verbal   Consent given by:  Patient   Risks, benefits, and alternatives were discussed: yes      Risks discussed:  Bleeding, infection, nasal injury and pain Universal protocol:    Procedure explained and questions answered to patient or proxy's satisfaction: yes     Required blood products, implants, devices, and special equipment available: yes     Immediately prior to procedure, a time out was called: yes   Anesthesia:    Anesthesia method:  Topical application   Topical anesthetic:  Lidocaine gel Procedure details:    Treatment site:  R anterior   Treatment method:  Merocel sponge   Treatment complexity:  Limited   Treatment episode: initial   Post-procedure details:    Assessment:  Bleeding stopped   Procedure completion:  Tolerated well, no immediate complications .1-3 Lead EKG Interpretation  Performed by: Paulette Blanch, MD Authorized by: Paulette Blanch, MD     Interpretation: abnormal     ECG rate:  75   ECG rate assessment: normal     Rhythm: atrial fibrillation     Ectopy: none     Conduction: normal   Comments:     Patient placed on cardiac monitor to evaluate for arrhythmias    MEDICATIONS ORDERED IN ED: Medications  furosemide (LASIX) tablet 20 mg (has no administration in time range)  losartan (COZAAR) tablet 25 mg (has no administration in time range)  metoprolol succinate (TOPROL-XL) 24 hr tablet 12.5 mg (has no administration in time range)  traZODone (DESYREL) tablet 50 mg (has no administration in time range)  potassium chloride SA (KLOR-CON M) CR tablet 20 mEq (has no administration in time range)  acetaminophen (TYLENOL) tablet 650 mg (has no administration in time range)    Or  acetaminophen (TYLENOL) suppository 650 mg (has no  administration in time range)  ondansetron (ZOFRAN) tablet 4 mg (has no administration in time range)    Or  ondansetron (ZOFRAN) injection 4 mg (has no administration in time range)  insulin aspart (novoLOG) injection 0-15 Units (has no administration in time range)  insulin aspart (novoLOG) injection 0-5 Units (  Subcutaneous Not Given 02/05/22 0335)  sodium chloride 0.9 % bolus 500 mL (0 mLs Intravenous Stopped 02/05/22 0128)  cephALEXin (KEFLEX) capsule 250 mg (250 mg Oral Given 02/05/22 0025)  hydrALAZINE (APRESOLINE) injection 5 mg (5 mg Intravenous Given 02/05/22 0134)     IMPRESSION / MDM / ASSESSMENT AND PLAN / ED COURSE  I reviewed the triage vital signs and the nursing notes.                             88 year old female on Eliquis presenting with right-sided nosebleed.  I have personally reviewed patient's records and note a recent cardiology visit from 01/31/2022 for follow-up status post mitral valve clip implementation.  Patient's presentation is most consistent with acute presentation with potential threat to life or bodily function.  The patient is on the cardiac monitor to evaluate for evidence of arrhythmia and/or significant heart rate changes.  Merocel sponge applied immediately on patient's arrival to the treatment room.  Bleeding stopped.  No bleeding in posterior oropharynx.  Will obtain basic lab work, gentle IV hydration and monitor.  Clinical Course as of 02/05/22 0344  Sun Feb 05, 2022  0037 No rebleeding.  Placed on 2 L nasal cannula oxygen for slight dip in room air saturation.  Patient complains of shortness of breath, will obtain chest x-ray.  We will add troponin. [JS]  0149 Trialed patient off oxygen and she dipped down to 91% on room air; 2L [place] oxygen reapplied.  Laboratory results demonstrate H/H 15/47, normal electrolytes, INR 1.6, troponin 23.  Chest x-ray demonstrates atelectasis versus bibasilar infiltrates.  Patient denies recent fever or chills but states she does cough due to allergies.  Have added blood cultures, lactic acid and procalcitonin.  We will start IV antibiotics if positive.  Hydralazine just given for hypertension.  At this time will consult hospital services for evaluation and admission. [JS]  4239 Addendum: Procalcitonin is negative; will hold  antibiotics for pneumonia.  Have conveyed to hospitalist services that patient will need Keflex 3 times daily x5 days and to have nasal packing removed by ENT in 5 days' time.  Blood pressure currently 178/81. [JS]    Clinical Course User Index [JS] Paulette Blanch, MD     FINAL CLINICAL IMPRESSION(S) / ED DIAGNOSES   Final diagnoses:  Right-sided epistaxis  Hypoxia  Weakness generalized     Rx / DC Orders   ED Discharge Orders     None        Note:  This document was prepared using Dragon voice recognition software and may include unintentional dictation errors.   Paulette Blanch, MD 02/05/22 [phone]

## 2022-02-05 NOTE — Assessment & Plan Note (Signed)
Followed at [facility] 

## 2022-02-05 NOTE — H&P (Signed)
History and Physical    Patient: Linda [name] ZOX:[id number] DOB: 1934/02/24 DOA: 02/04/2022 DOS: the patient was seen and examined on 02/05/2022 PCP: Crecencio Mc, MD  Patient coming from: Home  Chief Complaint:  Chief Complaint  Patient presents with   Epistaxis    HPI: Linda [name] is a 88 y.o. female with medical history significant for HFrEF (EF 40 to 45% 08/2021), severe MR s/p MitraClip at [facility] 11/2020, severe pulmonary hypertension, atrial fibrillation on apixaban, history of CVA, noninsulin-dependent type 2 diabetes, who presents to the ED with a nosebleed ongoing for 30 minutes, not responding to initial management at home.  EMS administered Afrin and applied a nasal clamp with improvement rate she was brought to the ED for further evaluation. ED course and data review: BP 160/88 with pulse 73 and O2 sat 88% on room air.  While in the ED patient became bradycardic to the 50s and tachypneic to 21-23.  She had a hypertensive response to 155/114.  She remained awake and alert. Labs significant for hemoglobin 15.8 and INR 1.6.  Troponin 23.  WBC 5.1 and procalcitonin less than 0.1.  EKG, personally viewed and interpreted showed A-fib at 55 with occasional PVCs.  Chest x-ray showed patchy right mid and lower lung opacities which could reflect atelectasis or pneumonia Patient underwent nasal packing with Merocel and had no further bleeding however on removal of supplemental O2 sats dropped to 91.  Observation thus requested.     Past Medical History:  Diagnosis Date   Anxiety    Arrhythmia    paroxysmal atrial fibrillation   CHF (congestive heart failure) (HCC)    Colon polyp    Diverticulitis    Dysrhythmia    GERD (gastroesophageal reflux disease)    Hard of hearing    Hoarseness    Hyperlipidemia    Hypertension    Kidney stone    Motion sickness    Parathyroid disease (HCC)    Parathyroidectomy    PONV (postoperative nausea and vomiting)    Pre-diabetes    PVC  (premature ventricular contraction)    Skin cancer    Squamous cell carcinoma of skin 07/19/2021   right lower pretibia lateral, EDC   Squamous cell carcinoma of skin 10/13/2014   R ant neck - KA pattern   Squamous cell carcinoma of skin 09/15/2021   Left dorsal hand, shaved down to fat at time of biopsy so should already be removed.  Will observe for recurrence.   Stroke [facility])    TIA (transient ischemic attack) 04/2018   no deficitis   UTI (lower urinary tract infection)    Varicose veins of both lower extremities    Wears hearing aid in both ears    Past Surgical History:  Procedure Laterality Date   ABDOMINAL HYSTERECTOMY     APPENDECTOMY     CATARACT EXTRACTION W/PHACO Left 03/05/2019   Procedure: CATARACT EXTRACTION PHACO AND INTRAOCULAR LENS PLACEMENT ([place])   0:57 15.6% 9.05;  Surgeon: Leandrew Koyanagi, MD;  Location: [facility];  Service: Ophthalmology;  Laterality: Left;  Diabetic - diet cotrolled   CATARACT EXTRACTION W/PHACO Right 04/09/2019   Procedure: CATARACT EXTRACTION PHACO AND INTRAOCULAR LENS PLACEMENT ([facility]) RIGHT DIABETIC 00:47.6  15.9%  7.60;  Surgeon: Leandrew Koyanagi, MD;  Location: [facility];  Service: Ophthalmology;  Laterality: Right;   CHOLECYSTECTOMY  2002   ESOPHAGOGASTRODUODENOSCOPY (EGD) WITH PROPOFOL N/A 12/17/2019   Procedure: ESOPHAGOGASTRODUODENOSCOPY (EGD) WITH PROPOFOL;  Surgeon: Robert Bellow, MD;  Location: [facility];  Service: Endoscopy;  Laterality: N/A;   LITHOTRIPSY     MITRAL VALVE REPAIR  12/14/2020   PARATHYROIDECTOMY  2004   1 removed   ROTATOR CUFF REPAIR  2002   TEE WITHOUT CARDIOVERSION N/A 09/29/2020   Procedure: TRANSESOPHAGEAL ECHOCARDIOGRAM (TEE);  Surgeon: Teodoro Spray, MD;  Location: [facility];  Service: Cardiovascular;  Laterality: N/A;   TONSILLECTOMY  1956   TONSILLECTOMY     TOTAL ABDOMINAL HYSTERECTOMY W/ BILATERAL SALPINGOOPHORECTOMY  1984   VEIN LIGATION AND STRIPPING     Social  History:  reports that she has never smoked. She has never used smokeless tobacco. She reports that she does not drink alcohol and does not use drugs.  Allergies  Allergen Reactions   Atorvastatin Other (See Comments)    Myalgias    Barium Iodide     Unknown   Statins Other (See Comments)    Myalgias    Crestor [Rosuvastatin Calcium] Other (See Comments)    Myalgia    Penicillins Rash   Sulfa Antibiotics Rash   Sulfonamide Derivatives Rash    Family History  Problem Relation Age of Onset   Heart disease Brother    Stroke Mother    Skin cancer Father    Breast cancer Paternal Grandmother     Prior to Admission medications   Medication Sig Start Date End Date Taking? Authorizing Provider  acetaminophen (TYLENOL) 500 MG tablet Take 500 mg by mouth every 6 (six) hours as needed for moderate pain or headache.    [provider]  blood glucose meter kit and supplies Dispense based on patient and insurance preference. Use up to four times daily as directed. (FOR ICD-10 E10.9, E11.9). 11/24/21   Crecencio Mc, MD  Blood Glucose Monitoring Suppl (ONETOUCH VERIO) w/Device KIT Use to check blood sugars up to 4 times daily. ICD 10: E11.29 11/29/21   Crecencio Mc, MD  cetirizine (ZYRTEC) 10 MG tablet Take 10 mg by mouth daily as needed for allergies.    [provider]  Cholecalciferol (VITAMIN D) 2000 units tablet Take 2,000 Units by mouth daily.    [provider]  dapagliflozin propanediol (FARXIGA) 5 MG TABS tablet Every other day for 14 days,  then daily 11/24/21   Crecencio Mc, MD  ELIQUIS 5 MG TABS tablet Take 1 tablet (5 mg total) by mouth 2 (two) times daily. 12/20/19   Robert Bellow, MD  estradiol (ESTRACE) 0.1 MG/GM vaginal cream Place 1 g vaginally 3 (three) times a week. Pea sized amount per urethra Patient not taking: Reported on 02/05/2022 06/23/20   Crecencio Mc, MD  furosemide (LASIX) 20 MG tablet Take 20 mg by mouth daily. 01/07/20    [provider]  glucose blood test strip Use to check blood sugars up to 4 times daily. ICD 10: E11.29 11/29/21   Crecencio Mc, MD  Lancets [facility] ULTRASOFT) lancets Use to check blood sugars up to 4 times daily. ICD-10: E11.29 11/29/21   Crecencio Mc, MD  losartan (COZAAR) 25 MG tablet Take 1 tablet (25 mg total) by mouth daily. 07/21/21   Crecencio Mc, MD  metoprolol succinate (TOPROL-XL) 25 MG 24 hr tablet Take 12.5 mg by mouth daily. 07/14/21   [provider]  mometasone (ELOCON) 0.1 % cream Apply twice daily as needed to itchy spots at body. Avoid applying to face, groin, and axilla. Use as directed. Long-term use can cause thinning of the skin.  12/12/21   Brendolyn Patty, MD  potassium chloride SA (KLOR-CON M) 20 MEQ tablet Take 1 tablet (20 mEq total) by mouth daily. 10/22/21   Crecencio Mc, MD  traZODone (DESYREL) 50 MG tablet Take 50 mg by mouth at bedtime. 05/05/20   [provider]    Physical Exam: Vitals:   02/05/22 0004 02/05/22 0030 02/05/22 0100  BP: (!) 160/88 (!) 155/119 (!) 155/114  Pulse: 73 (!) 50 (!) 38  Resp: 20 (!) 21 (!) 23  Temp: 98.2 F (36.8 C)    SpO2: 93% (!) 88% 97%   Physical Exam Vitals and nursing note reviewed.  Constitutional:      General: She is not in acute distress. HENT:     Head: Normocephalic and atraumatic.  Cardiovascular:     Rate and Rhythm: Regular rhythm. Bradycardia present.     Heart sounds: Normal heart sounds.  Pulmonary:     Effort: Tachypnea present.     Breath sounds: Decreased air movement present.  Abdominal:     Palpations: Abdomen is soft.     Tenderness: There is no abdominal tenderness.  Neurological:     Mental Status: Mental status is at baseline.     Labs on Admission: I have personally reviewed following labs and imaging studies  CBC: Recent Labs  Lab 02/05/22 0003  WBC 5.1  HGB 15.8*  HCT 47.8*  MCV 97.6  PLT 627   Basic Metabolic Panel: Recent Labs  Lab  02/05/22 0003  NA 138  K 3.7  CL 104  CO2 26  GLUCOSE 164*  BUN 22  CREATININE 0.66  CALCIUM 9.2   GFR: CrCl cannot be calculated (Unknown ideal weight.). Liver Function Tests: No results for input(s): "AST", "ALT", "ALKPHOS", "BILITOT", "PROT", "ALBUMIN" in the last 168 hours. No results for input(s): "LIPASE", "AMYLASE" in the last 168 hours. No results for input(s): "AMMONIA" in the last 168 hours. Coagulation Profile: Recent Labs  Lab 02/05/22 0003  INR 1.6*   Cardiac Enzymes: No results for input(s): "CKTOTAL", "CKMB", "CKMBINDEX", "TROPONINI" in the last 168 hours. BNP (last 3 results) Recent Labs    07/13/21 1036  PROBNP 418.0*   HbA1C: No results for input(s): "HGBA1C" in the last 72 hours. CBG: No results for input(s): "GLUCAP" in the last 168 hours. Lipid Profile: No results for input(s): "CHOL", "HDL", "LDLCALC", "TRIG", "CHOLHDL", "LDLDIRECT" in the last 72 hours. Thyroid Function Tests: No results for input(s): "TSH", "T4TOTAL", "FREET4", "T3FREE", "THYROIDAB" in the last 72 hours. Anemia Panel: No results for input(s): "VITAMINB12", "FOLATE", "FERRITIN", "TIBC", "IRON", "RETICCTPCT" in the last 72 hours. Urine analysis:    Component Value Date/Time   COLORURINE YELLOW 08/04/2020 1035   APPEARANCEUR Sl Cloudy (A) 08/04/2020 1035   APPEARANCEUR Cloudy (A) 11/15/2017 1358   LABSPEC 1.020 08/04/2020 1035   PHURINE 6.0 08/04/2020 1035   GLUCOSEU NEGATIVE 08/04/2020 1035   HGBUR TRACE-LYSED (A) 08/04/2020 1035   BILIRUBINUR Negative 03/22/2021 1533   BILIRUBINUR Negative 11/15/2017 [place] 08/04/2020 1035   PROTEINUR Positive (A) 03/22/2021 1533   PROTEINUR 1+ (A) 11/15/2017 1358   UROBILINOGEN 0.2 03/22/2021 1533   UROBILINOGEN 1.0 08/04/2020 1035   NITRITE Negative 03/22/2021 1533   NITRITE POSITIVE (A) 08/04/2020 1035   LEUKOCYTESUR Small (1+) (A) 03/22/2021 1533   LEUKOCYTESUR NEGATIVE 08/04/2020 1035    Radiological Exams  on Admission: DG Chest Port 1 View  Result Date: 02/05/2022 CLINICAL DATA:  Shortness of breath EXAM: PORTABLE CHEST 1 VIEW  COMPARISON:  07/13/2021 FINDINGS: Cardiomegaly, vascular congestion. Elevation of the right hemidiaphragm, stable. Patchy right mid lung and lower lobe airspace opacity. No focal opacity on the left. No effusions or acute bony abnormality. IMPRESSION: Patchy right mid and lower lung opacities could reflect atelectasis or pneumonia. Cardiomegaly, vascular congestion. Electronically Signed   By: Rolm Baptise M.D.   On: 02/05/2022 01:29     Data Reviewed: Relevant notes from primary care and specialist visits, past discharge summaries as available in EHR, including Care Everywhere. Prior diagnostic testing as pertinent to current admission diagnoses Updated medications and problem lists for reconciliation ED course, including vitals, labs, imaging, treatment and response to treatment Triage notes, nursing and pharmacy notes and ED provider's notes Notable results as noted in HPI   Assessment and Plan: Epistaxis Chronic anticoagulation on apixaban Hypoxia Bleeding controlled with [place] packing Monitor H&H Supplemental O2 and wean as tolerated Keflex 279m tid x 5 days and nasal packing to be removed by ENT in 5 days  Abnormal chest x-ray Chest x-ray showed patchy right mid and lower lung opacities which could reflect atelectasis or pneumonia WBC normal procalcitonin less than 0.10 Patient has a cough which she attributes to allergies Antitussives as needed If persistent hypoxia can consider further investigations Follow-up COVID and flu  [place] [facility] [facility] HFrEF (heart failure with reduced ejection fraction) ([place] Compensated.  Continue furosemide losartan and metoprolol  Severe mitral regurgitation s/p MitraClip [facility] History of CVA (cerebrovascular accident) without residual deficits On Eliquis, holding  overnight  Controlled type 2 diabetes mellitus with microalbuminuria, without long-term current use of insulin (HCC) Continue Farxiga Sliding scale insulin coverage  Essential hypertension Continue losartan, metoprolol and furosemide  ATRIAL FIBRILLATION We will hold Eliquis and resume likely in the a.m. once no further bleeding and hemoglobin stable Continue metoprolol        DVT prophylaxis: scd  Consults: none  Advance Care Planning:   Code Status: Prior   Family Communication: none  Disposition Plan: Back to previous home environment  Severity of Illness: The appropriate patient status for this patient is OBSERVATION. Observation status is judged to be reasonable and necessary in order to provide the required intensity of service to ensure the patient's safety. The patient's presenting symptoms, physical exam findings, and initial radiographic and laboratory data in the context of their medical condition is felt to place them at decreased risk for further clinical deterioration. Furthermore, it is anticipated that the patient will be medically stable for discharge from the hospital within 2 midnights of admission.   Author: HAthena Masse MD 02/05/2022 2:46 AM  For on call review [email or web address]

## 2022-02-05 NOTE — Care Management Obs Status (Signed)
[place] NOTIFICATION   Patient Details  Name: Linda [name] MRN: [id number] Date of Birth: 1934/03/14   Medicare Observation Status Notification Given:  Yes    Beverly Sessions, RN 02/05/2022, 3:10 PM

## 2022-02-05 NOTE — Assessment & Plan Note (Addendum)
Chronic in nature.  With bleeding through the packing we will hold Eliquis. Continue metoprolol

## 2022-02-05 NOTE — ED Notes (Signed)
[facility] called per pt request to notify pt is in ER

## 2022-02-05 NOTE — Assessment & Plan Note (Signed)
Continue losartan, metoprolol and furosemide

## 2022-02-05 NOTE — Assessment & Plan Note (Addendum)
Holding Eliquis with nosebleed.  Risk of stroke is higher being off Eliquis.

## 2022-02-05 NOTE — Assessment & Plan Note (Addendum)
Chronic anticoagulation on apixaban Hypoxia Packed in ER.  Some blood dripping through the packing.  Continue to hold Eliquis and continue to control blood pressure.  Case discussed with ENT and hopefully bleeding will stop with holding Eliquis and controlling blood pressure.  If severe bleeding may need to be repacked.

## 2022-02-05 NOTE — Assessment & Plan Note (Signed)
Need to hold Eliquis with bleeding through the packing.  Must weigh benefits and risks of anticoagulation.

## 2022-02-06 DIAGNOSIS — Z7901 Long term (current) use of anticoagulants: Secondary | ICD-10-CM | POA: Diagnosis not present

## 2022-02-06 DIAGNOSIS — R0902 Hypoxemia: Secondary | ICD-10-CM | POA: Diagnosis not present

## 2022-02-06 DIAGNOSIS — I4729 Other ventricular tachycardia: Secondary | ICD-10-CM

## 2022-02-06 DIAGNOSIS — I4811 Longstanding persistent atrial fibrillation: Secondary | ICD-10-CM | POA: Diagnosis not present

## 2022-02-06 DIAGNOSIS — R040 Epistaxis: Secondary | ICD-10-CM | POA: Diagnosis not present

## 2022-02-06 LAB — CBC
HCT: 47.5 % — ABNORMAL HIGH (ref 36.0–46.0)
Hemoglobin: 15.5 g/dL — ABNORMAL HIGH (ref 12.0–15.0)
MCH: 31.8 pg (ref 26.0–34.0)
MCHC: 32.6 g/dL (ref 30.0–36.0)
MCV: 97.3 fL (ref 80.0–100.0)
Platelets: 163 10*3/uL (ref 150–400)
RBC: 4.88 MIL/uL (ref 3.87–5.11)
RDW: 13.6 % (ref 11.5–15.5)
WBC: 6.4 10*3/uL (ref 4.0–10.5)
nRBC: 0 % (ref 0.0–0.2)

## 2022-02-06 LAB — BASIC METABOLIC PANEL
Anion gap: 6 (ref 5–15)
BUN: 18 mg/dL (ref 8–23)
CO2: 31 mmol/L (ref 22–32)
Calcium: 9 mg/dL (ref 8.9–10.3)
Chloride: 103 mmol/L (ref 98–111)
Creatinine, Ser: 0.71 mg/dL (ref 0.44–1.00)
GFR, Estimated: >60 mL/min (ref >60)
Glucose, Bld: 137 mg/dL — ABNORMAL HIGH (ref 70–99)
Potassium: 3.6 mmol/L (ref 3.5–5.1)
Sodium: 140 mmol/L (ref 135–145)

## 2022-02-06 LAB — GLUCOSE, CAPILLARY
Glucose-Capillary: 149 mg/dL — ABNORMAL HIGH (ref 70–99)
Glucose-Capillary: 114 mg/dL — ABNORMAL HIGH (ref 70–99)

## 2022-02-06 MED ORDER — AYR SALINE NASAL NA GEL
1.0000 | Freq: Four times a day (QID) | NASAL | Status: DC
  Filled 2022-02-06: qty 1

## 2022-02-06 MED ORDER — POTASSIUM CHLORIDE CRYS ER 20 MEQ PO TBCR
40.0000 meq | EXTENDED_RELEASE_TABLET | Freq: Once | ORAL | Status: AC
Start: 2022-02-06 — End: 2022-02-06
  Administered 2022-02-06: 40 meq via ORAL
  Filled 2022-02-06: qty 2

## 2022-02-06 MED ORDER — AYR SALINE NASAL NA GEL: 1.0000 | Freq: Four times a day (QID) | NASAL | 0 refills | Status: DC

## 2022-02-06 MED ORDER — OXYMETAZOLINE HCL 0.05 % NA SOLN
2.0000 | Freq: Two times a day (BID) | NASAL | Status: DC | PRN
  Administered 2022-02-06: 2 via NASAL
  Filled 2022-02-06: qty 15

## 2022-02-06 MED ORDER — CEPHALEXIN 500 MG PO CAPS: 500.0000 mg | ORAL_CAPSULE | Freq: Three times a day (TID) | ORAL | 0 refills | Status: DC

## 2022-02-06 MED ORDER — MAGNESIUM SULFATE 2 GM/50ML IV SOLN
2.0000 g | Freq: Once | INTRAVENOUS | Status: AC
  Administered 2022-02-06: 2 g via INTRAVENOUS
  Filled 2022-02-06: qty 50

## 2022-02-06 MED ORDER — OXYMETAZOLINE HCL 0.05 % NA SOLN: 2.0000 | Freq: Two times a day (BID) | NASAL | 0 refills | Status: DC | PRN

## 2022-02-06 NOTE — Consult Note (Signed)
Linda [name], [place] [id number] March 23, 1934 Linda Nearing, MD  Reason for Consult: Epistaxis Requesting Physician: Loletha Grayer, MD Consulting Physician: Linda Nearing, MD  HPI: This 88 y.o. year old female was admitted on 02/04/2022 for Weakness generalized [R53.1] Hypoxia [R09.02] Right-sided epistaxis [R04.0].  Patient was admitted to the hospital yesterday with right-sided epistaxis that had persisted with associated elevated blood pressure.  I talked with Dr. Bobbye Charleston is felt the bleeding was adequately controlled and did not warrant removal of her packing.  She had been on Eliquis and that has been stopped.  Last night she had some breakthrough bleeding that responded to application of Afrin.  From what she describes it was not excessively heavy but enough to get some blood on her gown.  CBC is stable, and no bleeding this morning.  Medications:  Current Facility-Administered Medications  Medication Dose Route Frequency Provider Last Rate Last Admin   acetaminophen (TYLENOL) tablet 650 mg  650 mg Oral Q6H PRN Athena Masse, MD       Or   acetaminophen (TYLENOL) suppository 650 mg  650 mg Rectal Q6H PRN Athena Masse, MD       cephALEXin (KEFLEX) capsule 500 mg  500 mg Oral Q8H Wieting, Richard, MD   500 mg at 02/06/22 0534   dapagliflozin propanediol (FARXIGA) tablet 10 mg  10 mg Oral Daily Wieting, Richard, MD       furosemide (LASIX) tablet 20 mg  20 mg Oral Daily Judd Gaudier V, MD   20 mg at 02/06/22 0848   insulin aspart (novoLOG) injection 0-15 Units  0-15 Units Subcutaneous TID WC Athena Masse, MD   2 Units at 02/06/22 0848   insulin aspart (novoLOG) injection 0-5 Units  0-5 Units Subcutaneous QHS Athena Masse, MD       loratadine (CLARITIN) tablet 10 mg  10 mg Oral Daily Loletha Grayer, MD   10 mg at 02/06/22 0849   losartan (COZAAR) tablet 25 mg  25 mg Oral Daily Judd Gaudier V, MD   25 mg at 02/06/22 0848   metoprolol succinate (TOPROL-XL) 24 hr tablet 12.5 mg  12.5  mg Oral Daily Judd Gaudier V, MD   12.5 mg at 02/06/22 0848   ondansetron (ZOFRAN) tablet 4 mg  4 mg Oral Q6H PRN Athena Masse, MD       Or   ondansetron [facility]) injection 4 mg  4 mg Intravenous Q6H PRN Athena Masse, MD       oxymetazoline (AFRIN) 0.05 % nasal spray 2 spray  2 spray Each Nare BID PRN Neomia Glass L, NP   2 spray at 02/06/22 0534   traZODone (DESYREL) tablet 50 mg  50 mg Oral QHS Athena Masse, MD   50 mg at 02/05/22 2117  .  Medications Prior to Admission  Medication Sig Dispense Refill   cetirizine (ZYRTEC) 10 MG tablet Take 10 mg by mouth daily as needed for allergies.     Cholecalciferol (VITAMIN D) 2000 units tablet Take 2,000 Units by mouth daily.     acetaminophen (TYLENOL) 500 MG tablet Take 500 mg by mouth every 6 (six) hours as needed for moderate pain or headache.     blood glucose meter kit and supplies Dispense based on patient and insurance preference. Use up to four times daily as directed. (FOR ICD-10 E10.9, E11.9). 1 each 0   Blood Glucose Monitoring Suppl (ONETOUCH VERIO) w/Device KIT Use to check blood sugars up to 4 times daily.  ICD 10: E11.29 1 kit 0   dapagliflozin propanediol (FARXIGA) 5 MG TABS tablet Every other day for 14 days,  then daily 30 tablet 0   ELIQUIS 5 MG TABS tablet Take 1 tablet (5 mg total) by mouth 2 (two) times daily. 60 tablet 0   furosemide (LASIX) 20 MG tablet Take 20 mg by mouth daily.     glucose blood test strip Use to check blood sugars up to 4 times daily. ICD 10: E11.29 400 each 0   Lancets (ONETOUCH ULTRASOFT) lancets Use to check blood sugars up to 4 times daily. ICD-10: E11.29 400 each 0   losartan (COZAAR) 25 MG tablet Take 1 tablet (25 mg total) by mouth daily. 90 tablet 1   metoprolol succinate (TOPROL-XL) 25 MG 24 hr tablet Take 12.5 mg by mouth daily.     mometasone (ELOCON) 0.1 % cream Apply twice daily as needed to itchy spots at body. Avoid applying to face, groin, and axilla. Use as directed. Long-term use  can cause thinning of the skin. 45 g 0   potassium chloride SA (KLOR-CON M) 20 MEQ tablet Take 1 tablet (20 mEq total) by mouth daily. 30 tablet 3   traZODone (DESYREL) 50 MG tablet Take 50 mg by mouth at bedtime.      Allergies:  Allergies  Allergen Reactions   Atorvastatin Other (See Comments)    Myalgias    Barium Iodide     Unknown   Statins Other (See Comments)    Myalgias    Crestor [Rosuvastatin Calcium] Other (See Comments)    Myalgia    Penicillins Rash   Sulfa Antibiotics Rash   Sulfonamide Derivatives Rash    PMH:  Past Medical History:  Diagnosis Date   Anxiety    Arrhythmia    paroxysmal atrial fibrillation   CHF (congestive heart failure) (HCC)    Colon polyp    Diverticulitis    Dysrhythmia    GERD (gastroesophageal reflux disease)    Hard of hearing    Hoarseness    Hyperlipidemia    Hypertension    Kidney stone    Motion sickness    Parathyroid disease (HCC)    Parathyroidectomy    PONV (postoperative nausea and vomiting)    Pre-diabetes    PVC (premature ventricular contraction)    Skin cancer    Squamous cell carcinoma of skin 07/19/2021   right lower pretibia lateral, EDC   Squamous cell carcinoma of skin 10/13/2014   R ant neck - KA pattern   Squamous cell carcinoma of skin 09/15/2021   Left dorsal hand, shaved down to fat at time of biopsy so should already be removed.  Will observe for recurrence.   Stroke [facility])    TIA (transient ischemic attack) 04/2018   no deficitis   UTI (lower urinary tract infection)    Varicose veins of both lower extremities    Wears hearing aid in both ears     Fam Hx:  Family History  Problem Relation Age of Onset   Heart disease Brother    Stroke Mother    Skin cancer Father    Breast cancer Paternal Grandmother     Soc Hx:  Social History   Socioeconomic History   Marital status: Widowed    Spouse name: [name]   Number of children: 2   Years of education: 12   Highest education level: Not  on file  Occupational History   Occupation: Product/process development scientist: RETIRED  Comment: Retired  Tobacco Use   Smoking status: Never   Smokeless tobacco: Never  Vaping Use   Vaping Use: Never used  Substance and Sexual Activity   Alcohol use: No   Drug use: No   Sexual activity: Not Currently    Birth control/protection: None  Other Topics Concern   Not on file  Social History Narrative   Katharine is from [place] and [place]. Recently widowed. She was married to Tok, her husband for 59 years. They have a daughter and a son. She enjoys gardening.   Social Determinants of Health   Financial Resource Strain: Low Risk  (11/01/2021)   Overall Financial Resource Strain (CARDIA)    Difficulty of Paying Living Expenses: Not very hard  Food Insecurity: No Food Insecurity (11/01/2021)   Hunger Vital Sign    Worried About Running Out of Food in the Last Year: Never true    Ran Out of Food in the Last Year: Never true  Transportation Needs: No Transportation Needs (11/01/2021)   PRAPARE - Hydrologist (Medical): No    Lack of Transportation (Non-Medical): No  Physical Activity: Inactive (08/25/2020)   Exercise Vital Sign    Days of Exercise per Week: 0 days    Minutes of Exercise per Session: 0 min  Stress: No Stress Concern Present (11/01/2021)   [place]    Feeling of Stress : Not at all  Social Connections: Moderately Integrated (11/01/2021)   Social Connection and Isolation Panel [NHANES]    Frequency of Communication with Friends and Family: Twice a week    Frequency of Social Gatherings with Friends and Family: Once a week    Attends Religious Services: 1 to 4 times per year    Active Member of [employer] or Organizations: Yes    Attends Archivist Meetings: 1 to 4 times per year    Marital Status: Widowed  Intimate Partner Violence: Not At Risk (11/01/2021)    Humiliation, Afraid, Rape, and Kick questionnaire    Fear of Current or Ex-Partner: No    Emotionally Abused: No    Physically Abused: No    Sexually Abused: No    PSH:  Past Surgical History:  Procedure Laterality Date   ABDOMINAL HYSTERECTOMY     APPENDECTOMY     CATARACT EXTRACTION W/PHACO Left 03/05/2019   Procedure: CATARACT EXTRACTION PHACO AND INTRAOCULAR LENS PLACEMENT ([place])   0:57 15.6% 9.05;  Surgeon: Leandrew Koyanagi, MD;  Location: [place];  Service: Ophthalmology;  Laterality: Left;  Diabetic - diet cotrolled   CATARACT EXTRACTION W/PHACO Right 04/09/2019   Procedure: CATARACT EXTRACTION PHACO AND INTRAOCULAR LENS PLACEMENT ([facility]) RIGHT DIABETIC 00:47.6  15.9%  7.60;  Surgeon: Leandrew Koyanagi, MD;  Location: [place];  Service: Ophthalmology;  Laterality: Right;   CHOLECYSTECTOMY  2002   ESOPHAGOGASTRODUODENOSCOPY (EGD) WITH PROPOFOL N/A 12/17/2019   Procedure: ESOPHAGOGASTRODUODENOSCOPY (EGD) WITH PROPOFOL;  Surgeon: Robert Bellow, MD;  Location: [facility];  Service: Endoscopy;  Laterality: N/A;   LITHOTRIPSY     MITRAL VALVE REPAIR  12/14/2020   PARATHYROIDECTOMY  2004   1 removed   ROTATOR CUFF REPAIR  2002   TEE WITHOUT CARDIOVERSION N/A 09/29/2020   Procedure: TRANSESOPHAGEAL ECHOCARDIOGRAM (TEE);  Surgeon: Teodoro Spray, MD;  Location: [facility];  Service: Cardiovascular;  Laterality: N/A;   [place]  HYSTERECTOMY W/ BILATERAL SALPINGOOPHORECTOMY  1984   VEIN LIGATION AND STRIPPING    . Procedures since admission: No admission procedures for hospital encounter.  ROS: Review of systems normal other than 12 systems except per HPI.  PHYSICAL EXAM  Vitals: Blood pressure (!) 147/84, pulse 66, temperature 97.8 F (36.6 C), resp. rate 18, weight 78.2 kg, SpO2 97 %.. General: Well-developed, Well-nourished in no acute distress Mood: Mood and affect well adjusted, pleasant and  cooperative. Orientation: Grossly alert and oriented. Vocal Quality: No hoarseness. Communicates verbally. head and Face: NCAT. No facial asymmetry. No visible skin lesions. No significant facial scars. No tenderness with sinus percussion. Facial strength normal and symmetric. Ears: External ears with normal landmarks, no lesions. External auditory canals free of infection, some cerumen in the right ear canal obscures the tympanic membrane. Tympanic membrane on the left is intact with good landmarks. No middle ear effusion. Hearing: Speech reception grossly normal. Nose: External nose normal with midline dorsum and no lesions or deformity. Nasal Cavity reveals an anterior pack in place.  There is no bleeding around the pack.  The pack is not saturated with blood, half of the visible pack is still white.  Left nasal cavity exhibits no active bleeding but there is a lot of dryness of the anterior septum. Oral Cavity/ Oropharynx: Lips are normal with no lesions. Teeth no frank dental caries. Gingiva healthy with no lesions or gingivitis. Oropharynx including tongue, buccal mucosa, floor of mouth, hard and soft palate, uvula and posterior pharynx free of exudates, erythema or lesions with normal symmetry and hydration.  No bleeding down the back of the throat Indirect Laryngoscopy/Nasopharyngoscopy: Visualization of the larynx, hypopharynx and nasopharynx is not possible in this setting with routine examination. Neck: Supple and symmetric with no palpable masses, tenderness or crepitance. The trachea is midline. Thyroid gland is soft, nontender and symmetric with no masses or enlargement. Parotid and submandibular glands are soft, nontender and symmetric, without masses. Lymphatic: Cervical lymph nodes are without palpable lymphadenopathy or tenderness. Respiratory: Normal respiratory effort without labored breathing. Cardiovascular: Carotid pulse shows regular rate and rhythm Neurologic: Cranial Nerves II  through XII are grossly intact. Eyes: Gaze and Ocular Motility are grossly normal. PERRLA. No visible nystagmus.  MEDICAL DECISION MAKING: Data Review:  Results for orders placed or performed during the hospital encounter of 02/04/22 (from the past 48 hour(s))  CBC     Status: Abnormal   Collection Time: 02/05/22 12:03 AM  Result Value Ref Range   WBC 5.1 4.0 - 10.5 K/uL   RBC 4.90 3.87 - 5.11 MIL/uL   Hemoglobin 15.8 (H) 12.0 - 15.0 g/dL   HCT 47.8 (H) 36.0 - 46.0 %   MCV 97.6 80.0 - 100.0 fL   MCH 32.2 26.0 - 34.0 pg   MCHC 33.1 30.0 - 36.0 g/dL   RDW 13.5 11.5 - 15.5 %   Platelets 156 150 - 400 K/uL   nRBC 0.0 0.0 - 0.2 %    Comment: Performed at [facility], [address], [place], [place] [address]  Basic metabolic panel     Status: Abnormal   Collection Time: 02/05/22 12:03 AM  Result Value Ref Range   Sodium 138 135 - 145 mmol/L   Potassium 3.7 3.5 - 5.1 mmol/L   Chloride 104 98 - 111 mmol/L   CO2 26 22 - 32 mmol/L   Glucose, Bld 164 (H) 70 - 99 mg/dL    Comment: Glucose reference range applies only to samples  taken after fasting for at least 8 hours.   BUN 22 8 - 23 mg/dL   Creatinine, Ser 0.66 0.44 - 1.00 mg/dL   Calcium 9.2 8.9 - 10.3 mg/dL   GFR, Estimated >60 >60 mL/min    Comment: (NOTE) Calculated using the CKD-EPI Creatinine Equation (2021)    Anion gap 8 5 - 15    Comment: Performed at [facility] [address] [place], [place] [address]  Protime-INR     Status: Abnormal   Collection Time: 02/05/22 12:03 AM  Result Value Ref Range   Prothrombin Time 18.7 (H) 11.4 - 15.2 seconds   INR 1.6 (H) 0.8 - 1.2    Comment: (NOTE) INR goal varies based on device and disease states. Performed at [facility], [address] [place], [place] [address]   Procalcitonin - Baseline     Status: None   Collection Time: 02/05/22 12:03 AM  Result Value Ref Range   Procalcitonin <0.10 ng/mL    Comment:        Interpretation: PCT  (Procalcitonin) <= 0.5 ng/mL: Systemic infection (sepsis) is not likely. Local bacterial infection is possible. (NOTE)       Sepsis PCT Algorithm           Lower Respiratory Tract                                      Infection PCT Algorithm    ----------------------------     ----------------------------         PCT < 0.25 ng/mL                PCT < 0.10 ng/mL          Strongly encourage             Strongly discourage   discontinuation of antibiotics    initiation of antibiotics    ----------------------------     -----------------------------       PCT 0.25 - 0.50 ng/mL            PCT 0.10 - 0.25 ng/mL               OR       >80% decrease in PCT            Discourage initiation of                                            antibiotics      Encourage discontinuation           of antibiotics    ----------------------------     -----------------------------         PCT >= 0.50 ng/mL              PCT 0.26 - 0.50 ng/mL               AND        <80% decrease in PCT             Encourage initiation of                                             antibiotics  Encourage continuation           of antibiotics    ----------------------------     -----------------------------        PCT >= 0.50 ng/mL                  PCT > 0.50 ng/mL               AND         increase in PCT                  Strongly encourage                                      initiation of antibiotics    Strongly encourage escalation           of antibiotics                                     -----------------------------                                           PCT <= 0.25 ng/mL                                                 OR                                        > 80% decrease in PCT                                      Discontinue / Do not initiate                                             antibiotics  Performed at [facility], [place], [place] [address]   Troponin I (High  Sensitivity)     Status: Abnormal   Collection Time: 02/05/22  1:00 AM  Result Value Ref Range   Troponin I (High Sensitivity) 23 (H) <18 ng/L    Comment: (NOTE) Elevated high sensitivity troponin I (hsTnI) values and significant  changes across serial measurements may suggest ACS but many other  chronic and acute conditions are known to elevate hsTnI results.  Refer to the "Links" section for chest pain algorithms and additional  guidance. Performed at [name], [address] [place], [place] [address]   CBG monitoring, ED     Status: Abnormal   Collection Time: 02/05/22  3:32 AM  Result Value Ref Range   Glucose-Capillary 138 (H) 70 - 99 mg/dL    Comment: Glucose reference range applies only to samples taken after fasting for at least 8 hours.  Culture, blood (routine x 2)     Status: None (Preliminary result)  Collection Time: 02/05/22  3:36 AM   Specimen: BLOOD  Result Value Ref Range   Specimen Description BLOOD LEFT AC    Special Requests      BOTTLES DRAWN AEROBIC AND ANAEROBIC Blood Culture results may not be optimal due to an inadequate volume of blood received in culture bottles   Culture      NO GROWTH 1 DAY Performed at [facility], [address], [place], [place] [address]    Report Status PENDING   Culture, blood (routine x 2)     Status: None (Preliminary result)   Collection Time: 02/05/22  3:36 AM   Specimen: BLOOD  Result Value Ref Range   Specimen Description BLOOD LEFT WRIST    Special Requests      BOTTLES DRAWN AEROBIC AND ANAEROBIC Blood Culture results may not be optimal due to an inadequate volume of blood received in culture bottles   Culture      NO GROWTH 1 DAY Performed at [facility], [address] [place], [place] [address]    Report Status PENDING   Lactic acid, plasma     Status: None   Collection Time: 02/05/22  3:36 AM  Result Value Ref Range   Lactic Acid, Venous 0.9 0.5 - 1.9 mmol/L     Comment: Performed at [facility], [address] [place], [place] [address]  CBC     Status: None   Collection Time: 02/05/22  3:36 AM  Result Value Ref Range   WBC 6.9 4.0 - 10.5 K/uL   RBC 4.62 3.87 - 5.11 MIL/uL   Hemoglobin 14.9 12.0 - 15.0 g/dL   HCT 45.2 36.0 - 46.0 %   MCV 97.8 80.0 - 100.0 fL   MCH 32.3 26.0 - 34.0 pg   MCHC 33.0 30.0 - 36.0 g/dL   RDW 13.5 11.5 - 15.5 %   Platelets 152 150 - 400 K/uL   nRBC 0.0 0.0 - 0.2 %    Comment: Performed at [facility], [address] [place], [place] [address]  SARS Coronavirus 2 by RT PCR (hospital order, performed in [facility] hospital lab) *cepheid single result test* Anterior Nasal Swab     Status: None   Collection Time: 02/05/22  7:54 AM   Specimen: Anterior Nasal Swab  Result Value Ref Range   SARS Coronavirus 2 by RT PCR NEGATIVE NEGATIVE    Comment: (NOTE) SARS-CoV-2 target nucleic acids are NOT DETECTED.  The SARS-CoV-2 RNA is generally detectable in upper and lower respiratory specimens during the acute phase of infection. The lowest concentration of SARS-CoV-2 viral copies this assay can detect is 250 copies / mL. A negative result does not preclude SARS-CoV-2 infection and should not be used as the sole basis for treatment or other patient management decisions.  A negative result may occur with improper specimen collection / handling, submission of specimen other than nasopharyngeal swab, presence of viral mutation(s) within the areas targeted by this assay, and inadequate number of viral copies (<250 copies / mL). A negative result must be combined with clinical observations, patient history, and epidemiological information.  Fact Sheet for Patients:   https://www.patel.info/  Fact Sheet for Healthcare Providers: https://hall.com/  This test is not yet approved or  cleared by the [place] FDA and has been authorized for detection and/or  diagnosis of SARS-CoV-2 by FDA under an Emergency Use Authorization (EUA).  This EUA will remain in effect (meaning this test can be used) for  the duration of the COVID-19 declaration under Section 564(b)(1) of the Act, 21 U.S.C. section 360bbb-3(b)(1), unless the authorization is terminated or revoked sooner.  Performed at [facility], [address] [place], [place] [address]   Glucose, capillary     Status: None   Collection Time: 02/05/22  4:23 PM  Result Value Ref Range   Glucose-Capillary 89 70 - 99 mg/dL    Comment: Glucose reference range applies only to samples taken after fasting for at least 8 hours.  Glucose, capillary     Status: None   Collection Time: 02/05/22  8:53 PM  Result Value Ref Range   Glucose-Capillary 97 70 - 99 mg/dL    Comment: Glucose reference range applies only to samples taken after fasting for at least 8 hours.  Gastrointestinal Panel by PCR , Stool     Status: None   Collection Time: 02/05/22  9:28 PM   Specimen: Stool  Result Value Ref Range   Campylobacter species NOT DETECTED NOT DETECTED   Plesimonas shigelloides NOT DETECTED NOT DETECTED   Salmonella species NOT DETECTED NOT DETECTED   Yersinia enterocolitica NOT DETECTED NOT DETECTED   Vibrio species NOT DETECTED NOT DETECTED   Vibrio cholerae NOT DETECTED NOT DETECTED   Enteroaggregative E coli (EAEC) NOT DETECTED NOT DETECTED   Enteropathogenic E coli (EPEC) NOT DETECTED NOT DETECTED   Enterotoxigenic E coli (ETEC) NOT DETECTED NOT DETECTED   Shiga like toxin producing E coli (STEC) NOT DETECTED NOT DETECTED   Shigella/Enteroinvasive E coli (EIEC) NOT DETECTED NOT DETECTED   Cryptosporidium NOT DETECTED NOT DETECTED   Cyclospora cayetanensis NOT DETECTED NOT DETECTED   Entamoeba histolytica NOT DETECTED NOT DETECTED   Giardia lamblia NOT DETECTED NOT DETECTED   Adenovirus F40/41 NOT DETECTED NOT DETECTED   Astrovirus NOT DETECTED NOT DETECTED   Norovirus GI/GII NOT  DETECTED NOT DETECTED   Rotavirus A NOT DETECTED NOT DETECTED   Sapovirus (I, II, IV, and V) NOT DETECTED NOT DETECTED    Comment: Performed at [facility], [address] [place], [place] [address]  C Difficile Quick Screen w PCR reflex     Status: None   Collection Time: 02/05/22  9:29 PM   Specimen: STOOL  Result Value Ref Range   C Diff antigen NEGATIVE NEGATIVE   C Diff toxin NEGATIVE NEGATIVE   C Diff interpretation No C. difficile detected.     Comment: NEGATIVE Performed at [facility], [address] [place], [place] [address]   CBC     Status: Abnormal   Collection Time: 02/06/22  4:07 AM  Result Value Ref Range   WBC 6.4 4.0 - 10.5 K/uL   RBC 4.88 3.87 - 5.11 MIL/uL   Hemoglobin 15.5 (H) 12.0 - 15.0 g/dL   HCT 47.5 (H) 36.0 - 46.0 %   MCV 97.3 80.0 - 100.0 fL   MCH 31.8 26.0 - 34.0 pg   MCHC 32.6 30.0 - 36.0 g/dL   RDW 13.6 11.5 - 15.5 %   Platelets 163 150 - 400 K/uL   nRBC 0.0 0.0 - 0.2 %    Comment: Performed at [facility], [address] [place], [place] [address]  Basic metabolic panel     Status: Abnormal   Collection Time: 02/06/22  4:07 AM  Result Value Ref Range   Sodium 140 135 - 145 mmol/L   Potassium 3.6 3.5 - 5.1 mmol/L   Chloride 103 98 - 111 mmol/L   CO2  31 22 - 32 mmol/L   Glucose, Bld 137 (H) 70 - 99 mg/dL    Comment: Glucose reference range applies only to samples taken after fasting for at least 8 hours.   BUN 18 8 - 23 mg/dL   Creatinine, Ser 0.71 0.44 - 1.00 mg/dL   Calcium 9.0 8.9 - 10.3 mg/dL   GFR, Estimated >60 >60 mL/min    Comment: (NOTE) Calculated using the CKD-EPI Creatinine Equation (2021)    Anion gap 6 5 - 15    Comment: Performed at [facility], [address] [place], [place] [address]  Glucose, capillary     Status: Abnormal   Collection Time: 02/06/22  8:36 AM  Result Value Ref Range   Glucose-Capillary 149 (H) 70 - 99 mg/dL    Comment: Glucose reference range applies  only to samples taken after fasting for at least 8 hours.  Glucose, capillary     Status: Abnormal   Collection Time: 02/06/22 11:43 AM  Result Value Ref Range   Glucose-Capillary 114 (H) 70 - 99 mg/dL    Comment: Glucose reference range applies only to samples taken after fasting for at least 8 hours.  Darletta Moll Chest Port 1 View  Result Date: 02/05/2022 CLINICAL DATA:  Shortness of breath EXAM: PORTABLE CHEST 1 VIEW COMPARISON:  07/13/2021 FINDINGS: Cardiomegaly, vascular congestion. Elevation of the right hemidiaphragm, stable. Patchy right mid lung and lower lobe airspace opacity. No focal opacity on the left. No effusions or acute bony abnormality. IMPRESSION: Patchy right mid and lower lung opacities could reflect atelectasis or pneumonia. Cardiomegaly, vascular congestion. Electronically Signed   By: Rolm Baptise M.D.   On: 02/05/2022 01:29  .   ASSESSMENT: Right sided epistaxis currently controlled with an anterior nasal pack.  She had some brief breakthrough bleeding last night.  Blood pressure was a little elevated, not too severe at that time.  It responded to Afrin.  Some of the bleeding last night was from the left side which has gotten excoriated from dry air.  PLAN: I offered to remove the right pack to inspect the nose further and see if there was anything we might be able to cauterize.  We discussed that I might have to replace the pack particularly because of her anticoagulation status.  Platelet function would still not be in the normal range at this point.  She declined to have the pack removed because she did not want to have to have it replaced due to discomfort.  Typically we would leave the pack in place for 5 days.  I can see her back in the office for removal of the packing.  Would recommend nasal saline gel be applied in the left nasal cavity to moisturize the nose and prevent bleeding from that side due to dryness.  If there is any breakthrough bleeding, Afrin can be sprayed into  the pack or into the left side of the nose and ice applied over the nasal dorsum.  Blood pressure should be regularly assessed to make sure her blood pressure is adequately controlled as this was likely a contributing factor to the nosebleeds.  I am not on-call today so please reconsult ENT if there is persistent heavy bleeding that does not respond to blood pressure control and the above measures.   Linda Nearing, MD 02/06/2022 11:47 AM

## 2022-02-06 NOTE — Assessment & Plan Note (Signed)
Replaced potassium and magnesium today.  Likely secondary to electrolyte abnormalities.

## 2022-02-06 NOTE — TOC Transition Note (Signed)
Transition of Care [facility]) - CM/SW Discharge Note   Patient Details  Name: Linda [name] MRN: [id number] Date of Birth: Aug 02, 1933  Transition of Care [facility]) CM/SW Contact:  Beverly Sessions, RN Phone Number: 02/06/2022, 2:03 PM   Clinical Narrative:     Patient to discharge today Patient states that her daughter is now back and down and is on the way to pick her up to transport her back to [place] independent.  Maudie Mercury at [facility] updated         Patient Goals and CMS Choice        Discharge Placement                       Discharge Plan and Services                                     Social Determinants of Health (SDOH) Interventions     Readmission Risk Interventions     No data to display

## 2022-02-06 NOTE — Progress Notes (Signed)
       CROSS COVER NOTE  NAME: Linda [name] MRN: [id number] DOB : July 03, 1933    Date of Service   02/06/22  HPI/Events of Note   Contacted by nursing reporting epistaxis. RN reached out to ENT who recommended applying ice and Afrin.  Interventions   Plan: Afrin PRN    This document was prepared using Dragon voice recognition software and may include unintentional dictation errors.  Neomia Glass DNP, MHA, FNP-BC Nurse Practitioner [facility] Pager [phone]

## 2022-02-06 NOTE — Discharge Summary (Signed)
Physician Discharge Summary   Patient: Linda [name] MRN: [id number] DOB: [birth year]  Admit date:     02/04/2022  Discharge date: 02/06/22  Discharge Physician: Loletha Grayer   PCP: Crecencio Mc, MD   Recommendations at discharge:   Follow-up ENT 4 days to remove nasal packing Follow-up PCP 1 week  Discharge Diagnoses: Principal Problem:   Epistaxis Active Problems:   Hypoxia   Chronic anticoagulation   Abnormal chest x-ray   ATRIAL FIBRILLATION   Essential hypertension   Controlled type 2 diabetes mellitus with microalbuminuria, without long-term current use of insulin (HCC)   History of CVA (cerebrovascular accident) without residual deficits   Severe mitral regurgitation s/p MitraClip June 2022   HFrEF (heart failure with reduced ejection fraction) ([place])   Pulmonary hypertension (HCC)   NSVT (nonsustained ventricular tachycardia) [facility])    Hospital Course: The patient was admitted on 02/05/2022 and discharged on 02/06/2022.  Patient came in with epistaxis and she takes Eliquis as outpatient.  Patient was packed in the ER.  She did have some blood dripping from the packing yesterday and early morning.  This improved after Afrin nasal spray.  Patient was seen by ENT and they discussed removing the packing versus leaving it in.  The patient chose to leave the packing in.  We will follow-up with ENT in 4 days to remove packing and further evaluation.  Will hold Eliquis until that time.  Risk of stroke higher being off Eliquis.  Afrin nasal spray as needed for bleeding.  Nasal saline gel applied every 6 hours to moisturize nose the left nostril.  Assessment and Plan: * Epistaxis Chronic anticoagulation on apixaban Hypoxia  Packed in ER.  Some blood dripping through the packing yesterday and early morning but seems to have stopped at this time.  Continue to hold Eliquis and continue to control blood pressure.  Follow-up with ENT to remove packing.  Continue to hold Eliquis until  that time.  Empiric Keflex.   Hypoxia 1 pulse ox of 88% on room air.  Last pulse ox patient is good off oxygen.  Chronic anticoagulation Need to hold Eliquis with bleeding through the packing.  Must weigh benefits and risks of anticoagulation.  Abnormal chest x-ray Possible pneumonia seen on chest x-ray likely secondary to blood.  Procalcitonin negative.  Holding off antibiotics for pneumonia but on empiric Keflex for nasal packing.  NSVT (nonsustained ventricular tachycardia) (HCC) Replaced potassium and magnesium today.  Likely secondary to electrolyte abnormalities.  Pulmonary hypertension (HCC) Followed at [facility]  HFrEF (heart failure with reduced ejection fraction) (HCC) No signs of heart failure.  Continue furosemide losartan and metoprolol  Severe mitral regurgitation s/p MitraClip June 2022 Followed at [facility]  History of CVA (cerebrovascular accident) without residual deficits Holding Eliquis with nosebleed.  Risk of stroke is higher being off Eliquis.  Controlled type 2 diabetes mellitus with microalbuminuria, without long-term current use of insulin ([place]) Continue Farxiga   Essential hypertension Continue losartan, metoprolol and furosemide  ATRIAL FIBRILLATION Chronic in nature.  With bleeding through the packing we will hold Eliquis. Continue metoprolol         Consultants: ENT Procedures performed: Nasal packing right nostril Disposition: Home Diet recommendation:  Cardiac and Carb modified diet DISCHARGE MEDICATION: Allergies as of 02/06/2022       Reactions   Atorvastatin Other (See Comments)   Myalgias   Barium Iodide    Unknown   Statins Other (See Comments)   Myalgias   Crestor [rosuvastatin Calcium]  Other (See Comments)   Myalgia   Penicillins Rash   Sulfa Antibiotics Rash   Sulfonamide Derivatives Rash        Medication List     STOP taking these medications    Eliquis 5 MG Tabs tablet Generic drug: apixaban   Vitamin D 50 MCG  (2000 UT) tablet       TAKE these medications    acetaminophen 500 MG tablet Commonly known as: TYLENOL Take 500 mg by mouth every 6 (six) hours as needed for moderate pain or headache.   blood glucose meter kit and supplies Dispense based on patient and insurance preference. Use up to four times daily as directed. (FOR ICD-10 E10.9, E11.9).   cephALEXin 500 MG capsule Commonly known as: KEFLEX Take 1 capsule (500 mg total) by mouth every 8 (eight) hours for 5 days.   cetirizine 10 MG tablet Commonly known as: ZYRTEC Take 10 mg by mouth daily as needed for allergies.   dapagliflozin propanediol 5 MG Tabs tablet Commonly known as: [place] Every other day for 14 days,  then daily   furosemide 20 MG tablet Commonly known as: LASIX Take 20 mg by mouth daily.   glucose blood test strip Use to check blood sugars up to 4 times daily. ICD 10: E11.29   losartan 25 MG tablet Commonly known as: COZAAR Take 1 tablet (25 mg total) by mouth daily.   metoprolol succinate 25 MG 24 hr tablet Commonly known as: TOPROL-XL Take 12.5 mg by mouth daily.   mometasone 0.1 % cream Commonly known as: ELOCON Apply twice daily as needed to itchy spots at body. Avoid applying to face, groin, and axilla. Use as directed. Long-term use can cause thinning of the skin.   onetouch ultrasoft lancets Use to check blood sugars up to 4 times daily. ICD-10: E11.29   OneTouch Verio w/Device Kit Use to check blood sugars up to 4 times daily. ICD 10: E11.29   oxymetazoline 0.05 % nasal spray Commonly known as: AFRIN Place 2 sprays into both nostrils 2 (two) times daily as needed (epistaxis only).   potassium chloride SA 20 MEQ tablet Commonly known as: KLOR-CON M Take 1 tablet (20 mEq total) by mouth daily.   saline Gel Place 1 Application into both nostrils every 6 (six) hours.   traZODone 50 MG tablet Commonly known as: DESYREL Take 50 mg by mouth at bedtime.        Follow-up Information      Crecencio Mc, MD Follow up in 1 week(s).   Specialty: Internal Medicine Contact information: [address] [place] [address] [phone]         Clyde Canterbury, MD Follow up in 4 day(s).   Specialty: Otolaryngology Contact information: [place] [id number] [phone]                Discharge Exam: Danley Danker Weights   02/05/22 1000  Weight: 78.2 kg   Physical Exam HENT:     Head: Normocephalic.     Nose:     Comments: Right nostril packed    Mouth/Throat:     Pharynx: No oropharyngeal exudate.  Eyes:     General: Lids are normal.     Conjunctiva/sclera: Conjunctivae normal.  Cardiovascular:     Rate and Rhythm: Normal rate and regular rhythm.     Heart sounds: S1 normal and S2 normal. Murmur heard.     Systolic murmur is present with a  grade of 3/6.  Pulmonary:     Breath sounds: No decreased breath sounds, wheezing, rhonchi or rales.  Abdominal:     Palpations: Abdomen is soft.     Tenderness: There is no abdominal tenderness.  Musculoskeletal:     Right lower leg: Swelling present.     Left lower leg: Swelling present.  Skin:    General: Skin is warm.     Comments: Chronic lower extremity skin discoloration  Neurological:     Mental Status: She is alert and oriented to person, place, and time.      Condition at discharge: stable  The results of significant diagnostics from this hospitalization (including imaging, microbiology, ancillary and laboratory) are listed below for reference.   Imaging Studies: DG Chest Port 1 View  Result Date: 02/05/2022 CLINICAL DATA:  Shortness of breath EXAM: PORTABLE CHEST 1 VIEW COMPARISON:  07/13/2021 FINDINGS: Cardiomegaly, vascular congestion. Elevation of the right hemidiaphragm, stable. Patchy right mid lung and lower lobe airspace opacity. No focal opacity on the left. No effusions or acute bony abnormality. IMPRESSION: Patchy right mid and lower  lung opacities could reflect atelectasis or pneumonia. Cardiomegaly, vascular congestion. Electronically Signed   By: Rolm Baptise M.D.   On: 02/05/2022 01:29    Microbiology: Results for orders placed or performed during the hospital encounter of 02/04/22  Culture, blood (routine x 2)     Status: None (Preliminary result)   Collection Time: 02/05/22  3:36 AM   Specimen: BLOOD  Result Value Ref Range Status   Specimen Description BLOOD LEFT AC  Final   Special Requests   Final    BOTTLES DRAWN AEROBIC AND ANAEROBIC Blood Culture results may not be optimal due to an inadequate volume of blood received in culture bottles   Culture   Final    NO GROWTH 1 DAY Performed at [facility], [address] [place], [place] [address]    Report Status PENDING  Incomplete  Culture, blood (routine x 2)     Status: None (Preliminary result)   Collection Time: 02/05/22  3:36 AM   Specimen: BLOOD  Result Value Ref Range Status   Specimen Description BLOOD LEFT WRIST  Final   Special Requests   Final    BOTTLES DRAWN AEROBIC AND ANAEROBIC Blood Culture results may not be optimal due to an inadequate volume of blood received in culture bottles   Culture   Final    NO GROWTH 1 DAY Performed at [facility], [address], [place], [place] [address]    Report Status PENDING  Incomplete  SARS Coronavirus 2 by RT PCR (hospital order, performed in [place] hospital lab) *cepheid single result test* Anterior Nasal Swab     Status: None   Collection Time: 02/05/22  7:54 AM   Specimen: Anterior Nasal Swab  Result Value Ref Range Status   SARS Coronavirus 2 by RT PCR NEGATIVE NEGATIVE Final    Comment: (NOTE) SARS-CoV-2 target nucleic acids are NOT DETECTED.  The SARS-CoV-2 RNA is generally detectable in upper and lower respiratory specimens during the acute phase of infection. The lowest concentration of SARS-CoV-2 viral copies this assay can detect is 250 copies / mL. A negative  result does not preclude SARS-CoV-2 infection and should not be used as the sole basis for treatment or other patient management decisions.  A negative result may occur with improper specimen collection / handling, submission of specimen other than nasopharyngeal swab, presence of viral mutation(s) within the  areas targeted by this assay, and inadequate number of viral copies (<250 copies / mL). A negative result must be combined with clinical observations, patient history, and epidemiological information.  Fact Sheet for Patients:   https://www.patel.info/  Fact Sheet for Healthcare Providers: https://hall.com/  This test is not yet approved or  cleared by the [place] FDA and has been authorized for detection and/or diagnosis of SARS-CoV-2 by FDA under an Emergency Use Authorization (EUA).  This EUA will remain in effect (meaning this test can be used) for the duration of the COVID-19 declaration under Section 564(b)(1) of the Act, 21 U.S.C. section 360bbb-3(b)(1), unless the authorization is terminated or revoked sooner.  Performed at [facility], [address] [place], [place] [address]   Gastrointestinal Panel by PCR , Stool     Status: None   Collection Time: 02/05/22  9:28 PM   Specimen: Stool  Result Value Ref Range Status   Campylobacter species NOT DETECTED NOT DETECTED Final   Plesimonas shigelloides NOT DETECTED NOT DETECTED Final   Salmonella species NOT DETECTED NOT DETECTED Final   Yersinia enterocolitica NOT DETECTED NOT DETECTED Final   Vibrio species NOT DETECTED NOT DETECTED Final   Vibrio cholerae NOT DETECTED NOT DETECTED Final   Enteroaggregative E coli (EAEC) NOT DETECTED NOT DETECTED Final   Enteropathogenic E coli (EPEC) NOT DETECTED NOT DETECTED Final   Enterotoxigenic E coli (ETEC) NOT DETECTED NOT DETECTED Final   Shiga like toxin producing E coli (STEC) NOT DETECTED NOT DETECTED Final    Shigella/Enteroinvasive E coli (EIEC) NOT DETECTED NOT DETECTED Final   Cryptosporidium NOT DETECTED NOT DETECTED Final   Cyclospora cayetanensis NOT DETECTED NOT DETECTED Final   Entamoeba histolytica NOT DETECTED NOT DETECTED Final   Giardia lamblia NOT DETECTED NOT DETECTED Final   Adenovirus F40/41 NOT DETECTED NOT DETECTED Final   Astrovirus NOT DETECTED NOT DETECTED Final   Norovirus GI/GII NOT DETECTED NOT DETECTED Final   Rotavirus A NOT DETECTED NOT DETECTED Final   Sapovirus (I, II, IV, and V) NOT DETECTED NOT DETECTED Final    Comment: Performed at [facility], [address] [place], [place] [address]  C Difficile Quick Screen w PCR reflex     Status: None   Collection Time: 02/05/22  9:29 PM   Specimen: STOOL  Result Value Ref Range Status   C Diff antigen NEGATIVE NEGATIVE Final   C Diff toxin NEGATIVE NEGATIVE Final   C Diff interpretation No C. difficile detected.  Final    Comment: NEGATIVE Performed at [facility], [address] [place], [place] [address]     Labs: CBC: Recent Labs  Lab 02/05/22 0003 02/05/22 0336 02/06/22 0407  WBC 5.1 6.9 6.4  HGB 15.8* 14.9 15.5*  HCT 47.8* 45.2 47.5*  MCV 97.6 97.8 97.3  PLT 156 152 277   Basic Metabolic Panel: Recent Labs  Lab 02/05/22 0003 02/06/22 0407  NA 138 140  K 3.7 3.6  CL 104 103  CO2 26 31  GLUCOSE 164* 137*  BUN 22 18  CREATININE 0.66 0.71  CALCIUM 9.2 9.0    CBG: Recent Labs  Lab 02/05/22 0332 02/05/22 1623 02/05/22 2053 02/06/22 0836 02/06/22 1143  GLUCAP 138* 89 97 149* 114*    Discharge time spent: greater than 30 minutes.  Signed: Loletha Grayer, MD [facility] 02/06/2022

## 2022-02-08 ENCOUNTER — Inpatient Hospital Stay (HOSPITAL_COMMUNITY): Payer: Medicare Other | Admitting: Certified Registered Nurse Anesthetist

## 2022-02-08 ENCOUNTER — Inpatient Hospital Stay
Admission: EM | Admit: 2022-02-08 | Discharge: 2022-02-08 | DRG: 150 | Disposition: A | Discharge status: Other Acute Inpatient Hospital | Payer: Medicare Other | Attending: Neurology | Admitting: Neurology

## 2022-02-08 ENCOUNTER — Encounter (HOSPITAL_COMMUNITY)
Admission: EM | Disposition: A | Discharge status: Rehabilitation Facility | Payer: Self-pay | Source: Other Acute Inpatient Hospital | Attending: Neurology

## 2022-02-08 ENCOUNTER — Inpatient Hospital Stay (HOSPITAL_COMMUNITY): Payer: Medicare Other

## 2022-02-08 ENCOUNTER — Encounter (HOSPITAL_COMMUNITY): Payer: Self-pay | Admitting: Interventional Radiology

## 2022-02-08 ENCOUNTER — Inpatient Hospital Stay (HOSPITAL_COMMUNITY)
Admission: EM | Admit: 2022-02-08 | Discharge: 2022-02-13 | DRG: 023 | Disposition: A | Discharge status: Rehabilitation Facility | Payer: Medicare Other | Source: Other Acute Inpatient Hospital | Attending: Neurology | Admitting: Neurology

## 2022-02-08 ENCOUNTER — Emergency Department: Payer: Medicare Other

## 2022-02-08 ENCOUNTER — Telehealth: Payer: Self-pay

## 2022-02-08 DIAGNOSIS — K219 Gastro-esophageal reflux disease without esophagitis: Secondary | ICD-10-CM | POA: Diagnosis present

## 2022-02-08 DIAGNOSIS — E876 Hypokalemia: Secondary | ICD-10-CM | POA: Diagnosis present

## 2022-02-08 DIAGNOSIS — F05 Delirium due to known physiological condition: Secondary | ICD-10-CM | POA: Diagnosis not present

## 2022-02-08 DIAGNOSIS — E215 Disorder of parathyroid gland, unspecified: Secondary | ICD-10-CM | POA: Diagnosis present

## 2022-02-08 DIAGNOSIS — E1169 Type 2 diabetes mellitus with other specified complication: Secondary | ICD-10-CM | POA: Diagnosis not present

## 2022-02-08 DIAGNOSIS — I110 Hypertensive heart disease with heart failure: Secondary | ICD-10-CM | POA: Diagnosis present

## 2022-02-08 DIAGNOSIS — I480 Paroxysmal atrial fibrillation: Secondary | ICD-10-CM | POA: Diagnosis present

## 2022-02-08 DIAGNOSIS — I509 Heart failure, unspecified: Secondary | ICD-10-CM

## 2022-02-08 DIAGNOSIS — J189 Pneumonia, unspecified organism: Secondary | ICD-10-CM | POA: Diagnosis present

## 2022-02-08 DIAGNOSIS — R001 Bradycardia, unspecified: Secondary | ICD-10-CM | POA: Diagnosis not present

## 2022-02-08 DIAGNOSIS — Z86010 Personal history of colonic polyps: Secondary | ICD-10-CM

## 2022-02-08 DIAGNOSIS — Z90710 Acquired absence of both cervix and uterus: Secondary | ICD-10-CM

## 2022-02-08 DIAGNOSIS — D6832 Hemorrhagic disorder due to extrinsic circulating anticoagulants: Secondary | ICD-10-CM | POA: Diagnosis present

## 2022-02-08 DIAGNOSIS — F419 Anxiety disorder, unspecified: Secondary | ICD-10-CM | POA: Diagnosis present

## 2022-02-08 DIAGNOSIS — F29 Unspecified psychosis not due to a substance or known physiological condition: Secondary | ICD-10-CM | POA: Diagnosis not present

## 2022-02-08 DIAGNOSIS — Z8673 Personal history of transient ischemic attack (TIA), and cerebral infarction without residual deficits: Secondary | ICD-10-CM | POA: Diagnosis not present

## 2022-02-08 DIAGNOSIS — R4701 Aphasia: Secondary | ICD-10-CM | POA: Diagnosis present

## 2022-02-08 DIAGNOSIS — Z888 Allergy status to other drugs, medicaments and biological substances status: Secondary | ICD-10-CM

## 2022-02-08 DIAGNOSIS — I493 Ventricular premature depolarization: Secondary | ICD-10-CM | POA: Diagnosis present

## 2022-02-08 DIAGNOSIS — I272 Pulmonary hypertension, unspecified: Secondary | ICD-10-CM | POA: Diagnosis present

## 2022-02-08 DIAGNOSIS — R29718 NIHSS score 18: Secondary | ICD-10-CM | POA: Diagnosis present

## 2022-02-08 DIAGNOSIS — R1310 Dysphagia, unspecified: Secondary | ICD-10-CM | POA: Diagnosis present

## 2022-02-08 DIAGNOSIS — Z978 Presence of other specified devices: Secondary | ICD-10-CM | POA: Diagnosis not present

## 2022-02-08 DIAGNOSIS — Z87442 Personal history of urinary calculi: Secondary | ICD-10-CM

## 2022-02-08 DIAGNOSIS — H5340 Unspecified visual field defects: Secondary | ICD-10-CM | POA: Diagnosis not present

## 2022-02-08 DIAGNOSIS — I4891 Unspecified atrial fibrillation: Secondary | ICD-10-CM | POA: Diagnosis not present

## 2022-02-08 DIAGNOSIS — I63412 Cerebral infarction due to embolism of left middle cerebral artery: Secondary | ICD-10-CM | POA: Diagnosis present

## 2022-02-08 DIAGNOSIS — Z823 Family history of stroke: Secondary | ICD-10-CM | POA: Diagnosis not present

## 2022-02-08 DIAGNOSIS — I6602 Occlusion and stenosis of left middle cerebral artery: Secondary | ICD-10-CM | POA: Diagnosis present

## 2022-02-08 DIAGNOSIS — F32A Depression, unspecified: Secondary | ICD-10-CM | POA: Diagnosis present

## 2022-02-08 DIAGNOSIS — Z85828 Personal history of other malignant neoplasm of skin: Secondary | ICD-10-CM

## 2022-02-08 DIAGNOSIS — I5022 Chronic systolic (congestive) heart failure: Secondary | ICD-10-CM | POA: Diagnosis present

## 2022-02-08 DIAGNOSIS — E785 Hyperlipidemia, unspecified: Secondary | ICD-10-CM | POA: Diagnosis present

## 2022-02-08 DIAGNOSIS — Z808 Family history of malignant neoplasm of other organs or systems: Secondary | ICD-10-CM

## 2022-02-08 DIAGNOSIS — I4821 Permanent atrial fibrillation: Secondary | ICD-10-CM | POA: Diagnosis not present

## 2022-02-08 DIAGNOSIS — I63512 Cerebral infarction due to unspecified occlusion or stenosis of left middle cerebral artery: Secondary | ICD-10-CM | POA: Diagnosis not present

## 2022-02-08 DIAGNOSIS — Z8249 Family history of ischemic heart disease and other diseases of the circulatory system: Secondary | ICD-10-CM

## 2022-02-08 DIAGNOSIS — Z882 Allergy status to sulfonamides status: Secondary | ICD-10-CM

## 2022-02-08 DIAGNOSIS — Z9282 Status post administration of tPA (rtPA) in a different facility within the last 24 hours prior to admission to current facility: Secondary | ICD-10-CM

## 2022-02-08 DIAGNOSIS — E892 Postprocedural hypoparathyroidism: Secondary | ICD-10-CM | POA: Diagnosis present

## 2022-02-08 DIAGNOSIS — I639 Cerebral infarction, unspecified: Principal | ICD-10-CM

## 2022-02-08 DIAGNOSIS — E559 Vitamin D deficiency, unspecified: Secondary | ICD-10-CM | POA: Diagnosis present

## 2022-02-08 DIAGNOSIS — I69351 Hemiplegia and hemiparesis following cerebral infarction affecting right dominant side: Secondary | ICD-10-CM | POA: Diagnosis present

## 2022-02-08 DIAGNOSIS — G4700 Insomnia, unspecified: Secondary | ICD-10-CM | POA: Diagnosis not present

## 2022-02-08 DIAGNOSIS — H9190 Unspecified hearing loss, unspecified ear: Secondary | ICD-10-CM | POA: Diagnosis present

## 2022-02-08 DIAGNOSIS — Z880 Allergy status to penicillin: Secondary | ICD-10-CM

## 2022-02-08 DIAGNOSIS — G8191 Hemiplegia, unspecified affecting right dominant side: Secondary | ICD-10-CM | POA: Diagnosis present

## 2022-02-08 DIAGNOSIS — I482 Chronic atrial fibrillation, unspecified: Secondary | ICD-10-CM | POA: Diagnosis present

## 2022-02-08 DIAGNOSIS — Z7901 Long term (current) use of anticoagulants: Secondary | ICD-10-CM

## 2022-02-08 DIAGNOSIS — I69320 Aphasia following cerebral infarction: Secondary | ICD-10-CM | POA: Diagnosis not present

## 2022-02-08 DIAGNOSIS — G9341 Metabolic encephalopathy: Secondary | ICD-10-CM | POA: Diagnosis not present

## 2022-02-08 DIAGNOSIS — I472 Ventricular tachycardia, unspecified: Secondary | ICD-10-CM | POA: Diagnosis present

## 2022-02-08 DIAGNOSIS — T45515A Adverse effect of anticoagulants, initial encounter: Secondary | ICD-10-CM | POA: Diagnosis present

## 2022-02-08 DIAGNOSIS — Z20822 Contact with and (suspected) exposure to covid-19: Secondary | ICD-10-CM | POA: Diagnosis present

## 2022-02-08 DIAGNOSIS — Z66 Do not resuscitate: Secondary | ICD-10-CM | POA: Diagnosis not present

## 2022-02-08 DIAGNOSIS — R7303 Prediabetes: Secondary | ICD-10-CM | POA: Diagnosis present

## 2022-02-08 DIAGNOSIS — Z515 Encounter for palliative care: Secondary | ICD-10-CM | POA: Diagnosis not present

## 2022-02-08 DIAGNOSIS — Z781 Physical restraint status: Secondary | ICD-10-CM

## 2022-02-08 DIAGNOSIS — I5030 Unspecified diastolic (congestive) heart failure: Secondary | ICD-10-CM | POA: Diagnosis not present

## 2022-02-08 DIAGNOSIS — G460 Middle cerebral artery syndrome: Secondary | ICD-10-CM | POA: Diagnosis present

## 2022-02-08 DIAGNOSIS — Z79899 Other long term (current) drug therapy: Secondary | ICD-10-CM

## 2022-02-08 DIAGNOSIS — Z90722 Acquired absence of ovaries, bilateral: Secondary | ICD-10-CM

## 2022-02-08 DIAGNOSIS — I69392 Facial weakness following cerebral infarction: Secondary | ICD-10-CM | POA: Diagnosis not present

## 2022-02-08 DIAGNOSIS — R29810 Facial weakness: Secondary | ICD-10-CM | POA: Diagnosis not present

## 2022-02-08 DIAGNOSIS — Z8719 Personal history of other diseases of the digestive system: Secondary | ICD-10-CM | POA: Diagnosis not present

## 2022-02-08 DIAGNOSIS — I5032 Chronic diastolic (congestive) heart failure: Secondary | ICD-10-CM | POA: Diagnosis present

## 2022-02-08 DIAGNOSIS — I340 Nonrheumatic mitral (valve) insufficiency: Secondary | ICD-10-CM | POA: Diagnosis present

## 2022-02-08 DIAGNOSIS — R040 Epistaxis: Principal | ICD-10-CM | POA: Diagnosis present

## 2022-02-08 DIAGNOSIS — R0902 Hypoxemia: Secondary | ICD-10-CM | POA: Diagnosis present

## 2022-02-08 DIAGNOSIS — Z91041 Radiographic dye allergy status: Secondary | ICD-10-CM

## 2022-02-08 DIAGNOSIS — E119 Type 2 diabetes mellitus without complications: Secondary | ICD-10-CM | POA: Diagnosis present

## 2022-02-08 DIAGNOSIS — I69312 Visuospatial deficit and spatial neglect following cerebral infarction: Secondary | ICD-10-CM | POA: Diagnosis not present

## 2022-02-08 DIAGNOSIS — R059 Cough, unspecified: Secondary | ICD-10-CM | POA: Diagnosis not present

## 2022-02-08 DIAGNOSIS — J9620 Acute and chronic respiratory failure, unspecified whether with hypoxia or hypercapnia: Secondary | ICD-10-CM | POA: Diagnosis not present

## 2022-02-08 DIAGNOSIS — Z803 Family history of malignant neoplasm of breast: Secondary | ICD-10-CM

## 2022-02-08 DIAGNOSIS — Z7189 Other specified counseling: Secondary | ICD-10-CM | POA: Diagnosis not present

## 2022-02-08 DIAGNOSIS — R809 Proteinuria, unspecified: Secondary | ICD-10-CM | POA: Diagnosis present

## 2022-02-08 DIAGNOSIS — Z974 Presence of external hearing-aid: Secondary | ICD-10-CM | POA: Diagnosis not present

## 2022-02-08 DIAGNOSIS — R451 Restlessness and agitation: Secondary | ICD-10-CM | POA: Diagnosis not present

## 2022-02-08 DIAGNOSIS — I6389 Other cerebral infarction: Secondary | ICD-10-CM | POA: Diagnosis not present

## 2022-02-08 DIAGNOSIS — I629 Nontraumatic intracranial hemorrhage, unspecified: Secondary | ICD-10-CM | POA: Diagnosis not present

## 2022-02-08 DIAGNOSIS — E1151 Type 2 diabetes mellitus with diabetic peripheral angiopathy without gangrene: Secondary | ICD-10-CM | POA: Diagnosis not present

## 2022-02-08 HISTORY — PX: IR CT HEAD LTD: IMG2386

## 2022-02-08 HISTORY — PX: IR PERCUTANEOUS ART THROMBECTOMY/INFUSION INTRACRANIAL INC DIAG ANGIO: IMG6087

## 2022-02-08 HISTORY — PX: RADIOLOGY WITH ANESTHESIA: SHX6223

## 2022-02-08 LAB — DIFFERENTIAL
Abs Immature Granulocytes: 0.03 10*3/uL (ref 0.00–0.07)
Basophils Absolute: 0.1 10*3/uL (ref 0.0–0.1)
Basophils Relative: 1 %
Eosinophils Absolute: 0.2 10*3/uL (ref 0.0–0.5)
Eosinophils Relative: 3 %
Immature Granulocytes: 1 %
Lymphocytes Relative: 22 %
Lymphs Abs: 1.4 10*3/uL (ref 0.7–4.0)
Monocytes Absolute: 0.7 10*3/uL (ref 0.1–1.0)
Monocytes Relative: 11 %
Neutro Abs: 4.1 10*3/uL (ref 1.7–7.7)
Neutrophils Relative %: 62 %

## 2022-02-08 LAB — RESP PANEL BY RT-PCR (FLU A&B, COVID) ARPGX2
Influenza A by PCR: NEGATIVE
Influenza B by PCR: NEGATIVE
SARS Coronavirus 2 by RT PCR: NEGATIVE

## 2022-02-08 LAB — URINALYSIS, ROUTINE W REFLEX MICROSCOPIC
Bilirubin Urine: NEGATIVE
Glucose, UA: NEGATIVE mg/dL
Hgb urine dipstick: NEGATIVE
Ketones, ur: 5 mg/dL — AB
Leukocytes,Ua: NEGATIVE
Nitrite: NEGATIVE
Protein, ur: NEGATIVE mg/dL
Specific Gravity, Urine: 1.036 — ABNORMAL HIGH (ref 1.005–1.030)
pH: 7 (ref 5.0–8.0)

## 2022-02-08 LAB — COMPREHENSIVE METABOLIC PANEL
ALT: 23 U/L (ref 0–44)
AST: 27 U/L (ref 15–41)
Albumin: 3.9 g/dL (ref 3.5–5.0)
Alkaline Phosphatase: 76 U/L (ref 38–126)
Anion gap: 10 (ref 5–15)
BUN: 24 mg/dL — ABNORMAL HIGH (ref 8–23)
CO2: 26 mmol/L (ref 22–32)
Calcium: 9.3 mg/dL (ref 8.9–10.3)
Chloride: 102 mmol/L (ref 98–111)
Creatinine, Ser: 0.72 mg/dL (ref 0.44–1.00)
GFR, Estimated: >60 mL/min (ref >60)
Glucose, Bld: 124 mg/dL — ABNORMAL HIGH (ref 70–99)
Potassium: 4 mmol/L (ref 3.5–5.1)
Sodium: 138 mmol/L (ref 135–145)
Total Bilirubin: 1.7 mg/dL — ABNORMAL HIGH (ref 0.3–1.2)
Total Protein: 6.7 g/dL (ref 6.5–8.1)

## 2022-02-08 LAB — APTT: aPTT: 28 seconds (ref 24–36)

## 2022-02-08 LAB — URINE DRUG SCREEN, QUALITATIVE (ARMC ONLY)
Amphetamines, Ur Screen: NOT DETECTED
Barbiturates, Ur Screen: NOT DETECTED
Benzodiazepine, Ur Scrn: NOT DETECTED
Cannabinoid 50 Ng, Ur ~~LOC~~: NOT DETECTED
Cocaine Metabolite,Ur ~~LOC~~: NOT DETECTED
MDMA (Ecstasy)Ur Screen: NOT DETECTED
Methadone Scn, Ur: NOT DETECTED
Opiate, Ur Screen: NOT DETECTED
Phencyclidine (PCP) Ur S: NOT DETECTED
Tricyclic, Ur Screen: NOT DETECTED

## 2022-02-08 LAB — CBC
HCT: 51.4 % — ABNORMAL HIGH (ref 36.0–46.0)
Hemoglobin: 16.6 g/dL — ABNORMAL HIGH (ref 12.0–15.0)
MCH: 31.8 pg (ref 26.0–34.0)
MCHC: 32.3 g/dL (ref 30.0–36.0)
MCV: 98.5 fL (ref 80.0–100.0)
Platelets: 186 10*3/uL (ref 150–400)
RBC: 5.22 MIL/uL — ABNORMAL HIGH (ref 3.87–5.11)
RDW: 13.5 % (ref 11.5–15.5)
WBC: 6.5 10*3/uL (ref 4.0–10.5)
nRBC: 0 % (ref 0.0–0.2)

## 2022-02-08 LAB — CBG MONITORING, ED: Glucose-Capillary: 110 mg/dL — ABNORMAL HIGH (ref 70–99)

## 2022-02-08 LAB — PROTIME-INR
INR: 1.3 — ABNORMAL HIGH (ref 0.8–1.2)
Prothrombin Time: 15.8 seconds — ABNORMAL HIGH (ref 11.4–15.2)

## 2022-02-08 LAB — GLUCOSE, CAPILLARY: Glucose-Capillary: 145 mg/dL — ABNORMAL HIGH (ref 70–99)

## 2022-02-08 LAB — ETHANOL: Alcohol, Ethyl (B): <10 mg/dL (ref <10)

## 2022-02-08 SURGERY — IR WITH ANESTHESIA
Anesthesia: General

## 2022-02-08 MED ORDER — PROPOFOL 500 MG/50ML IV EMUL
INTRAVENOUS | Status: DC | PRN
  Administered 2022-02-08: 50 ug/kg/min via INTRAVENOUS

## 2022-02-08 MED ORDER — IOHEXOL 300 MG/ML  SOLN
100.0000 mL | Freq: Once | INTRAMUSCULAR | Status: AC | PRN
Start: 2022-02-08 — End: 2022-02-08
  Administered 2022-02-08: 64 mL via INTRA_ARTERIAL

## 2022-02-08 MED ORDER — CEPHALEXIN 250 MG/5ML PO SUSR
500.0000 mg | Freq: Three times a day (TID) | ORAL | Status: DC
  Administered 2022-02-09 – 2022-02-11 (×7): 500 mg
  Filled 2022-02-08 (×9): qty 10

## 2022-02-08 MED ORDER — FENTANYL CITRATE (PF) 250 MCG/5ML IJ SOLN
INTRAMUSCULAR | Status: AC
  Filled 2022-02-08: qty 5

## 2022-02-08 MED ORDER — POLYETHYLENE GLYCOL 3350 17 G PO PACK
17.0000 g | PACK | Freq: Every day | ORAL | Status: DC
  Administered 2022-02-09 – 2022-02-11 (×3): 17 g
  Filled 2022-02-08 (×3): qty 1

## 2022-02-08 MED ORDER — ACETAMINOPHEN 650 MG RE SUPP: 650.0000 mg | RECTAL | Status: DC | PRN

## 2022-02-08 MED ORDER — SODIUM CHLORIDE 0.9 % IV SOLN: INTRAVENOUS | Status: DC | PRN

## 2022-02-08 MED ORDER — PROPOFOL 1000 MG/100ML IV EMUL: 0.0000 ug/kg/min | INTRAVENOUS | Status: DC

## 2022-02-08 MED ORDER — VANCOMYCIN HCL IN DEXTROSE 1-5 GM/200ML-% IV SOLN
INTRAVENOUS | Status: AC
  Filled 2022-02-08: qty 200

## 2022-02-08 MED ORDER — SENNOSIDES-DOCUSATE SODIUM 8.6-50 MG PO TABS: 1.0000 | ORAL_TABLET | Freq: Every evening | ORAL | Status: DC | PRN

## 2022-02-08 MED ORDER — CLEVIDIPINE BUTYRATE 0.5 MG/ML IV EMUL
INTRAVENOUS | Status: AC
  Administered 2022-02-08: 2 mg/h via INTRAVENOUS
  Filled 2022-02-08: qty 50

## 2022-02-08 MED ORDER — INSULIN ASPART 100 UNIT/ML IJ SOLN
0.0000 [IU] | INTRAMUSCULAR | Status: DC
  Administered 2022-02-09 (×4): 1 [IU] via SUBCUTANEOUS
  Administered 2022-02-09 (×2): 2 [IU] via SUBCUTANEOUS
  Administered 2022-02-10 (×2): 1 [IU] via SUBCUTANEOUS
  Administered 2022-02-10: 2 [IU] via SUBCUTANEOUS
  Administered 2022-02-10: 1 [IU] via SUBCUTANEOUS
  Administered 2022-02-11: 2 [IU] via SUBCUTANEOUS
  Administered 2022-02-11 (×2): 1 [IU] via SUBCUTANEOUS
  Administered 2022-02-12: 3 [IU] via SUBCUTANEOUS
  Administered 2022-02-12 (×2): 1 [IU] via SUBCUTANEOUS
  Administered 2022-02-12 – 2022-02-13 (×3): 2 [IU] via SUBCUTANEOUS
  Administered 2022-02-13: 1 [IU] via SUBCUTANEOUS

## 2022-02-08 MED ORDER — ROCURONIUM BROMIDE 10 MG/ML (PF) SYRINGE
PREFILLED_SYRINGE | INTRAVENOUS | Status: DC | PRN
  Administered 2022-02-08: 20 mg via INTRAVENOUS
  Administered 2022-02-08: 60 mg via INTRAVENOUS

## 2022-02-08 MED ORDER — SODIUM CHLORIDE 0.9 % IV SOLN: INTRAVENOUS | Status: DC

## 2022-02-08 MED ORDER — VANCOMYCIN HCL 1000 MG IV SOLR
INTRAVENOUS | Status: DC | PRN
  Administered 2022-02-08: 1000 mg via INTRAVENOUS

## 2022-02-08 MED ORDER — LIDOCAINE HCL 1 % IJ SOLN
INTRAMUSCULAR | Status: AC
  Administered 2022-02-08: 10 mL
  Filled 2022-02-08: qty 20

## 2022-02-08 MED ORDER — PHENYLEPHRINE HCL-NACL 20-0.9 MG/250ML-% IV SOLN
INTRAVENOUS | Status: DC | PRN
  Administered 2022-02-08: 40 ug/min via INTRAVENOUS

## 2022-02-08 MED ORDER — PHENYLEPHRINE 80 MCG/ML (10ML) SYRINGE FOR IV PUSH (FOR BLOOD PRESSURE SUPPORT)
PREFILLED_SYRINGE | INTRAVENOUS | Status: DC | PRN
  Administered 2022-02-08: 80 ug via INTRAVENOUS
  Administered 2022-02-08: 160 ug via INTRAVENOUS
  Administered 2022-02-08: 80 ug via INTRAVENOUS

## 2022-02-08 MED ORDER — INSULIN ASPART 100 UNIT/ML IJ SOLN: 0.0000 [IU] | INTRAMUSCULAR | Status: DC

## 2022-02-08 MED ORDER — CHLORHEXIDINE GLUCONATE CLOTH 2 % EX PADS
6.0000 | MEDICATED_PAD | Freq: Every day | CUTANEOUS | Status: DC
  Administered 2022-02-10 – 2022-02-13 (×4): 6 via TOPICAL

## 2022-02-08 MED ORDER — IOHEXOL 300 MG/ML  SOLN
100.0000 mL | Freq: Once | INTRAMUSCULAR | Status: AC | PRN
  Administered 2022-02-08: 64 mL via INTRA_ARTERIAL

## 2022-02-08 MED ORDER — TENECTEPLASE FOR STROKE
PACK | INTRAVENOUS | Status: AC
  Administered 2022-02-08: 19.5 mg
  Filled 2022-02-08: qty 10

## 2022-02-08 MED ORDER — ORAL CARE MOUTH RINSE: 15.0000 mL | OROMUCOSAL | Status: DC | PRN

## 2022-02-08 MED ORDER — DOCUSATE SODIUM 50 MG/5ML PO LIQD
100.0000 mg | Freq: Two times a day (BID) | ORAL | Status: DC
  Administered 2022-02-09 – 2022-02-11 (×5): 100 mg
  Filled 2022-02-08 (×6): qty 10

## 2022-02-08 MED ORDER — ONDANSETRON HCL 4 MG/2ML IJ SOLN
INTRAMUSCULAR | Status: DC | PRN
  Administered 2022-02-08: 4 mg via INTRAVENOUS

## 2022-02-08 MED ORDER — ORAL CARE MOUTH RINSE
15.0000 mL | OROMUCOSAL | Status: DC
  Administered 2022-02-08 – 2022-02-10 (×23): 15 mL via OROMUCOSAL

## 2022-02-08 MED ORDER — CLEVIDIPINE BUTYRATE 0.5 MG/ML IV EMUL: 0.0000 mg/h | INTRAVENOUS | Status: DC

## 2022-02-08 MED ORDER — ACETAMINOPHEN 325 MG PO TABS
650.0000 mg | ORAL_TABLET | ORAL | Status: DC | PRN
  Administered 2022-02-10 – 2022-02-11 (×2): 650 mg via ORAL
  Filled 2022-02-08 (×3): qty 2

## 2022-02-08 MED ORDER — FENTANYL CITRATE (PF) 250 MCG/5ML IJ SOLN
INTRAMUSCULAR | Status: DC | PRN
  Administered 2022-02-08: 100 ug via INTRAVENOUS

## 2022-02-08 MED ORDER — CLEVIDIPINE BUTYRATE 0.5 MG/ML IV EMUL
0.0000 mg/h | INTRAVENOUS | Status: AC
  Administered 2022-02-09: 2 mg/h via INTRAVENOUS
  Filled 2022-02-08: qty 50

## 2022-02-08 MED ORDER — STROKE: EARLY STAGES OF RECOVERY BOOK
Freq: Once | Status: AC
Start: 2022-02-09 — End: 2022-02-09
  Filled 2022-02-08 (×2): qty 1

## 2022-02-08 MED ORDER — IOHEXOL 350 MG/ML SOLN
100.0000 mL | Freq: Once | INTRAVENOUS | Status: AC | PRN
  Administered 2022-02-08: 100 mL via INTRAVENOUS

## 2022-02-08 MED ORDER — FENTANYL CITRATE PF 50 MCG/ML IJ SOSY
25.0000 ug | PREFILLED_SYRINGE | INTRAMUSCULAR | Status: DC | PRN
  Administered 2022-02-09 (×3): 100 ug via INTRAVENOUS
  Administered 2022-02-10: 50 ug via INTRAVENOUS
  Filled 2022-02-08 (×4): qty 2

## 2022-02-08 MED ORDER — SUCCINYLCHOLINE CHLORIDE 200 MG/10ML IV SOSY
PREFILLED_SYRINGE | INTRAVENOUS | Status: DC | PRN
  Administered 2022-02-08: 100 mg via INTRAVENOUS

## 2022-02-08 MED ORDER — FENTANYL CITRATE PF 50 MCG/ML IJ SOSY: 25.0000 ug | PREFILLED_SYRINGE | INTRAMUSCULAR | Status: DC | PRN

## 2022-02-08 MED ORDER — CLEVIDIPINE BUTYRATE 0.5 MG/ML IV EMUL
0.0000 mg/h | INTRAVENOUS | Status: DC
  Administered 2022-02-08: 6 mg/h via INTRAVENOUS

## 2022-02-08 MED ORDER — CLEVIDIPINE BUTYRATE 0.5 MG/ML IV EMUL
INTRAVENOUS | Status: AC
  Filled 2022-02-08: qty 50

## 2022-02-08 MED ORDER — PANTOPRAZOLE 2 MG/ML SUSPENSION
40.0000 mg | Freq: Every day | ORAL | Status: DC
  Administered 2022-02-09 – 2022-02-10 (×2): 40 mg
  Filled 2022-02-08 (×2): qty 20

## 2022-02-08 MED ORDER — PROPOFOL 10 MG/ML IV BOLUS
INTRAVENOUS | Status: DC | PRN
  Administered 2022-02-08: 70 mg via INTRAVENOUS

## 2022-02-08 MED ORDER — ACETAMINOPHEN 160 MG/5ML PO SOLN
650.0000 mg | ORAL | Status: DC | PRN
  Administered 2022-02-10: 650 mg

## 2022-02-08 MED ORDER — LIDOCAINE 2% (20 MG/ML) 5 ML SYRINGE
INTRAMUSCULAR | Status: DC | PRN
  Administered 2022-02-08: 100 mg via INTRAVENOUS

## 2022-02-08 MED ORDER — DEXAMETHASONE SODIUM PHOSPHATE 10 MG/ML IJ SOLN
INTRAMUSCULAR | Status: DC | PRN
  Administered 2022-02-08: 5 mg via INTRAVENOUS

## 2022-02-08 MED ORDER — PANTOPRAZOLE SODIUM 40 MG IV SOLR
40.0000 mg | Freq: Every day | INTRAVENOUS | Status: DC
  Administered 2022-02-08: 40 mg via INTRAVENOUS
  Filled 2022-02-08: qty 10

## 2022-02-08 NOTE — ED Triage Notes (Signed)
Pt went to a friends house for "help". Shortly after arriving pt was unable to speak. LKW [id number]. With EMS pt had left facial droop and left gaze and would not follow commands. EMS called code stroke

## 2022-02-08 NOTE — Anesthesia Postprocedure Evaluation (Signed)
Anesthesia Post Note  Patient: Linda [name]  Procedure(s) Performed: IR WITH ANESTHESIA     Patient location during evaluation: SICU Anesthesia Type: General Level of consciousness: sedated Pain management: pain level controlled Vital Signs Assessment: post-procedure vital signs reviewed and stable Respiratory status: patient remains intubated per anesthesia plan Cardiovascular status: stable Postop Assessment: no apparent nausea or vomiting Anesthetic complications: no   No notable events documented.  Last Vitals:  Vitals:   02/08/22 2315 02/08/22 2318  BP:    Pulse: (!) 37 73  Resp: 18 18  Temp:    SpO2: 100% 100%    Last Pain:  Vitals:   02/08/22 2238  TempSrc: Axillary                 Jarryd Gratz DANIEL

## 2022-02-08 NOTE — H&P (Signed)
Neurology H&P   CC: Aphasia   History is obtained from: Chart review and son; unable to reach daughter initially   HPI: Linda [name] is a 88 y.o. female with a past medical history significant for atrial fibrillation on anticoagulation for 4 days secondary to epistaxis, left ventricular hypertrophy, severe mitral regurgitation s/p MitraClip (11/2020), heart failure with reduced ejection fraction, hypertension, hyperlipidemia, type 2 diabetes.  At baseline she lives independently in a continuing care community and manages her own ADLs as well as most of her IADLs including driving, but requires some assistance from family with finances.  She walks independently within her home but occasionally uses a walker outside of the home  She was evaluated by Dr. Cheral Marker at [facility] for acute onset left MCA syndrome, found to have an NIH of 18 and an M3 occlusion for which she was given TNK and transferred for thrombectomy  I was unable to meet patient prior to IR due to attending to another emergency, and my examination is pending completion of IR  LKW: 4:50 PM on 8/23 TNK given?:  Yes, 6:52 PM on 8/23 at [facility] IA performed?:  Yes Premorbid modified rankin scale:      2 - Slight disability. Able to look after own affairs without assistance, but unable to carry out all previous activities.  ROS: Unable to obtain secondary to aphasia  Past Medical History:  Diagnosis Date   Anxiety    Arrhythmia    paroxysmal atrial fibrillation   CHF (congestive heart failure) (HCC)    Colon polyp    Diverticulitis    Dysrhythmia    GERD (gastroesophageal reflux disease)    Hard of hearing    Hoarseness    Hyperlipidemia    Hypertension    Kidney stone    Motion sickness    Parathyroid disease (HCC)    Parathyroidectomy    PONV (postoperative nausea and vomiting)    Pre-diabetes    PVC (premature ventricular contraction)    Skin cancer    Squamous cell carcinoma of skin 07/19/2021   right lower pretibia  lateral, EDC   Squamous cell carcinoma of skin 10/13/2014   R ant neck - KA pattern   Squamous cell carcinoma of skin 09/15/2021   Left dorsal hand, shaved down to fat at time of biopsy so should already be removed.  Will observe for recurrence.   Stroke [facility])    TIA (transient ischemic attack) 04/2018   no deficitis   UTI (lower urinary tract infection)    Varicose veins of both lower extremities    Wears hearing aid in both ears    Current Outpatient Medications  Medication Instructions   acetaminophen (TYLENOL) 500 mg, Oral, Every 6 hours PRN   blood glucose meter kit and supplies Dispense based on patient and insurance preference. Use up to four times daily as directed. (FOR ICD-10 E10.9, E11.9).   Blood Glucose Monitoring Suppl (ONETOUCH VERIO) w/Device KIT Use to check blood sugars up to 4 times daily. ICD 10: E11.29   cephALEXin (KEFLEX) 500 mg, Oral, Every 8 hours   cetirizine (ZYRTEC) 10 mg, Oral, Daily PRN   dapagliflozin propanediol (FARXIGA) 5 MG TABS tablet Every other day for 14 days,  then daily   furosemide (LASIX) 20 mg, Oral, Daily   glucose blood test strip Use to check blood sugars up to 4 times daily. ICD 10: E11.29   Lancets (ONETOUCH ULTRASOFT) lancets Use to check blood sugars up to 4 times daily. ICD-10:  E11.29   losartan (COZAAR) 25 mg, Oral, Daily   metoprolol succinate (TOPROL-XL) 12.5 mg, Oral, Daily   mometasone (ELOCON) 0.1 % cream Apply twice daily as needed to itchy spots at body. Avoid applying to face, groin, and axilla. Use as directed. Long-term use can cause thinning of the skin.   oxymetazoline (AFRIN) 0.05 % nasal spray 2 sprays, Each Nare, 2 times daily PRN   potassium chloride SA (KLOR-CON M) 20 MEQ tablet 20 mEq, Oral, Daily   saline (AYR) GEL 1 Application, Each Nare, Every 6 hours   traZODone (DESYREL) 50 mg, Oral, Daily at bedtime     Family History  Problem Relation Age of Onset   Heart disease Brother    Stroke Mother    Skin cancer  Father    Breast cancer Paternal Grandmother     Social History:  reports that she has never smoked. She has never used smokeless tobacco. She reports that she does not drink alcohol and does not use drugs.   Exam: Current vital signs: There were no vitals taken for this visit. Vital signs in last 24 hours: Temp:  [98.2 F (36.8 C)-98.3 F (36.8 C)] 98.2 F (36.8 C) (08/23 1945) Pulse Rate:  [74-104] 94 (08/23 1930) Resp:  [14-148] 22 (08/23 1945) BP: (128-188)/(87-127) 175/99 (08/23 1945) SpO2:  [90 %-98 %] 98 % (08/23 1945) Weight:  [78 kg] 78 kg (08/23 1830)  Physical Exam  Constitutional: Appears elderly but well nourished  Psych: Minimally interactive Eyes: Scleral edema is absent   HENT: ET tube in place. Nasal packing in place MSK: no joint deformities.  Cardiovascular: Normal rate and regular rhythm.  Respiratory: Breathing comfortably on the ventilator GI: Soft.  No distension. There is no tenderness.  Skin: Warm dry and intact visible skin.    Neuro: Mental Status: on precedex for sedation while intubated Does not open eyes spontaneously, to voice or noxious stimulation Does not follow any commands Cranial Nerves: II: Pupils are equal, round, and reactive to light.   III,IV, VI/VIII: EOMI to VOR V/VII: Facial sensation is symmetric to eyelash brush VIII: No response to voice X/XI: Intact cough/gag XII: Unable to assess tongue protrusion secondary to patient's mental status  Motor/Sensory: Spontaneously moving all extremities except LUE (A line w/ board in place). Equally reactive to light noxious stim in all four extremities Deep Tendon Reflexes: 2+ and symmetric patellar  Plantars: Toes are downgoing bilaterally.  Cerebellar: Unable to assess secondary to patient's mental status    I have reviewed labs in epic and the results pertinent to this consultation are:  Basic Metabolic Panel: Recent Labs  Lab 02/05/22 0003 02/06/22 0407 02/08/22 1919   NA 138 140 138  K 3.7 3.6 4.0  CL 104 103 102  CO2 26 31 26   GLUCOSE 164* 137* 124*  BUN 22 18 24*  CREATININE 0.66 0.71 0.72  CALCIUM 9.2 9.0 9.3     CBC: Recent Labs  Lab 02/05/22 0003 02/05/22 0336 02/06/22 0407 02/08/22 1851  WBC 5.1 6.9 6.4 6.5  NEUTROABS  --   --   --  4.1  HGB 15.8* 14.9 15.5* 16.6*  HCT 47.8* 45.2 47.5* 51.4*  MCV 97.6 97.8 97.3 98.5  PLT 156 152 163 186     Coagulation Studies: Recent Labs    02/08/22 1919  LABPROT 15.8*  INR 1.3*       I have reviewed the images obtained:  Head CT personally reviewed, agree with radiology:   1.  Atrophy and chronic microvascular ischemic change in the white matter. No acute abnormality. 2. ASPECTS is 10  CTA CTP personally reviewed, agree with radiology:   1. Occlusion left M3 branch supplying the left parietal lobe. 2. CT perfusion reveals 8 mL of core infarct in the left parietal lobe with surrounding penumbra. Estimated 80 mL of penumbra in the left hemisphere. CT perfusion also shows delayed perfusion in the right frontal and parietal lobe which could be due to artifact as there is no associated stenosis to this area. 3. No significant carotid or vertebral artery stenosis in the neck 4. These results were called by telephone at the time of interpretation on 02/08/2022 at 7:00 pm to provider ERIC [facility] , who verbally acknowledged these results. 5. Asymmetric mucosal edema in the right paranasal sinuses involving the right ethmoid and maxillary sinus with air-fluid level right maxillary sinus. There is diffuse soft tissue thickening in the right nasal cavity which could be due to polyp or tumor. Recommend direct visualization.   CXR 02/05/2022 personally reviewed, agree with radiology:   Patchy right mid and lower lung opacities could reflect atelectasis or pneumonia.  Cardiomegaly, vascular congestion.   Impression: Embolic stroke iso afib not on anticoagulation secondary to epistaxis    Recommendations: # L MCA stroke s/p TNK and IA w/ TICI3 - Stroke labs TSH, HgbA1c, fasting lipid panel - MRI brain 24 hr post TNK - Frequent neuro checks - Hold antiplatelets post TNK - Risk factor modification - Telemetry monitoring in ICU  - Blood pressure goal   - Post successful uncomplicated revascularization SBP 120 - 140 for 24 hours; if complications have arisen or only partial revascularization reach out to interventionalist or neurologist on call for BP goal - PT consult, OT consult, Speech consult, unless patient is back to baseline - Admitted to stroke team  # Afib - Hold AC given recent TNK, start date pending MRI brain and clinical course  # Epistaxis -Continue to monitor, reach out to ENT if needed -Possibly Keflex 500 mg every 8 hours or substitute with IV antibiotic (appreciate CCM assistance)  # Diabetes -Sliding scale insulin -Goal A1c less than 7%  #Hyperlipidemia -Goal LDL less than 70, statin intolerant -Consider outpatient PSK 9 inhibitor  #Heart failure with reduced EF -Appreciate CCM assistance with managing volume status  #Intubated for procedure; kept intubated given recent shortness of breath, epistaxsis and secretions -Appreciate CCM for vent management  Lesleigh Noe MD-PhD [facility] [phone]  Total critical care time: 40 minutes   Critical care time was exclusive of separately billable procedures and treating other patients.   Critical care was necessary to treat or prevent imminent or life-threatening deterioration.   Critical care was time spent personally by me on the following activities: development of treatment plan with patient and/or surrogate as well as nursing, discussions with consultants/primary team, evaluation of patient's response to treatment, examination of patient, obtaining history from patient or surrogate, ordering and performing treatments and interventions, ordering and review of laboratory  studies, ordering and review of radiographic studies, and re-evaluation of patient's condition as needed, as documented above.

## 2022-02-08 NOTE — ED Notes (Signed)
EMTALA reviewed and verbal consent for transfer retained from daughter over the phone.

## 2022-02-08 NOTE — Anesthesia Preprocedure Evaluation (Addendum)
Anesthesia Evaluation  Patient identified by MRN, date of birth, ID band Patient unresponsive    Reviewed: H&P , Patient's Chart, lab work & pertinent test results, reviewed documented beta blocker date and time , Unable to perform ROS - Chart review onlyPreop documentation limited or incomplete due to emergent nature of procedure.  History of Anesthesia Complications (+) PONV and history of anesthetic complications  Airway Mallampati: III       Dental  (+) Poor Dentition   Pulmonary neg pulmonary ROS,    breath sounds clear to auscultation       Cardiovascular hypertension, + Peripheral Vascular Disease and +CHF  + dysrhythmias Atrial Fibrillation  Rate:Tachycardia  Echo 09/2020 IMPRESSIONS    1. Left ventricular ejection fraction, by estimation, is 50 to 55%. The  left ventricle has low normal function. The left ventricle has no regional  wall motion abnormalities.  2. Right ventricular systolic function is normal. The right ventricular  size is normal.  3. Left atrial size was mildly dilated. No left atrial/left atrial  appendage thrombus was detected.  4. The mitral valve is degenerative. Moderate to severe mitral valve  regurgitation.  5. The aortic valve is tricuspid. Aortic valve regurgitation is not  visualized.    Neuro/Psych PSYCHIATRIC DISORDERS Anxiety TIACVA, No Residual Symptoms    GI/Hepatic Neg liver ROS, GERD  Medicated,  Endo/Other  diabetes  Renal/GU negative Renal ROS  negative genitourinary   Musculoskeletal   Abdominal   Peds  Hematology negative hematology ROS (+)   Anesthesia Other Findings   Reproductive/Obstetrics negative OB ROS                            Anesthesia Physical  Anesthesia Plan  ASA: IV and emergent  Anesthesia Plan: General   Post-op Pain Management: Minimal or no pain anticipated   Induction:   PONV Risk Score and Plan: 4 or  greater and Ondansetron and Dexamethasone  Airway Management Planned: Oral ETT  Additional Equipment:   Intra-op Plan:   Post-operative Plan: Post-operative intubation/ventilation  Informed Consent: I have reviewed the patients History and Physical, chart, labs and discussed the procedure including the risks, benefits and alternatives for the proposed anesthesia with the patient or authorized representative who has indicated his/her understanding and acceptance.       Plan Discussed with: CRNA, Anesthesiologist and Surgeon  Anesthesia Plan Comments:        Anesthesia Quick Evaluation

## 2022-02-08 NOTE — Anesthesia Procedure Notes (Signed)
Procedure Name: Intubation Date/Time: 02/08/2022 8:21 PM  Performed by: Reece Agar, CRNAPre-anesthesia Checklist: Patient identified, Emergency Drugs available, Suction available and Patient being monitored Patient Re-evaluated:Patient Re-evaluated prior to induction Oxygen Delivery Method: Circle System Utilized Preoxygenation: Pre-oxygenation with 100% oxygen Induction Type: IV induction, Rapid sequence and Cricoid Pressure applied Laryngoscope Size: Mac and 3 Grade View: Grade II Tube type: Oral Tube size: 7.0 mm Number of attempts: 1 Airway Equipment and Method: Stylet Placement Confirmation: ETT inserted through vocal cords under direct vision, positive ETCO2 and breath sounds checked- equal and bilateral Secured at: 22 cm Tube secured with: Tape Dental Injury: Teeth and Oropharynx as per pre-operative assessment

## 2022-02-08 NOTE — Anesthesia Procedure Notes (Signed)
Arterial Line Insertion Start/End8/23/2023 8:26 PM, 02/08/2022 8:40 PM Performed by: Wilburn Cornelia, CRNA, CRNA  Patient location: OR. Left, radial was placed Catheter size: 20 G Hand hygiene performed , maximum sterile barriers used  and Seldinger technique used Allen's test indicative of satisfactory collateral circulation Attempts: 3 Procedure performed without using ultrasound guided technique. Following insertion, dressing applied and Biopatch. Post procedure assessment: normal and unchanged  Patient tolerated the procedure well with no immediate complications.

## 2022-02-08 NOTE — ED Provider Notes (Signed)
[facility] Provider Note    Event Date/Time   First MD Initiated Contact with Patient 02/08/22 1850     (approximate)   History   Code Stroke   HPI  Linda [name] is a 88 y.o. female  who presents to the emergency department today because of concern for change in speech. The patient apparently had called a friend this afternoon stating that she needed help. About twenty minutes after meeting with her friend her friend noticed that she was confused and having trouble speaking. EMS noted left gaze preference and would only answer okay to questions. Last known well was 450.        Physical Exam   Triage Vital Signs: ED Triage Vitals [02/08/22 1815]  Enc Vitals Group     BP      Pulse      Resp      Temp      Temp src      SpO2 96 %     Weight      Height      Head Circumference      Peak Flow      Pain Score      Pain Loc      Pain Edu?      Excl. in [place]?     Most recent vital signs: Vitals:   02/08/22 1815  SpO2: 96%   General: Awake, alert. Not oriented. CV:  Good peripheral perfusion.  Resp:  Normal effort.  Abd:  No distention.     ED Results / Procedures / Treatments   Labs (all labs ordered are listed, but only abnormal results are displayed) Labs Reviewed  CBC - Abnormal; Notable for the following components:      Result Value   RBC 5.22 (*)    Hemoglobin 16.6 (*)    HCT 51.4 (*)    All other components within normal limits  URINALYSIS, ROUTINE W REFLEX MICROSCOPIC - Abnormal; Notable for the following components:   Color, Urine STRAW (*)    APPearance CLEAR (*)    Specific Gravity, Urine 1.036 (*)    Ketones, ur 5 (*)    All other components within normal limits  COMPREHENSIVE METABOLIC PANEL - Abnormal; Notable for the following components:   Glucose, Bld 124 (*)    BUN 24 (*)    Total Bilirubin 1.7 (*)    All other components within normal limits  PROTIME-INR - Abnormal; Notable for the following components:    Prothrombin Time 15.8 (*)    INR 1.3 (*)    All other components within normal limits  CBG MONITORING, ED - Abnormal; Notable for the following components:   Glucose-Capillary 110 (*)    All other components within normal limits  RESP PANEL BY RT-PCR (FLU A&B, COVID) ARPGX2  ETHANOL  DIFFERENTIAL  URINE DRUG SCREEN, QUALITATIVE ([facility] ONLY)  APTT     EKG  I, Nance Pear, attending physician, personally viewed and interpreted this EKG  EKG Time: 1905 Rate: 78 Rhythm: atrial fibrillation Axis: normal Intervals: qtc 420 QRS: narrow ST changes: no st elevation Impression: abnormal ekg   RADIOLOGY I independently interpreted and visualized the CT head. My interpretation: No bleed Radiology interpretation:  IMPRESSION:  1. Atrophy and chronic microvascular ischemic change in the white  matter. No acute abnormality.  2. ASPECTS is 10  3. Code stroke imaging results were communicated on 02/08/2022 at  6:36 pm to provider Lindzen via [facility] app  I independently interpreted and visualized the CT angio head/neck. My interpretation: No bleed Radiology interpretation:  IMPRESSION:  1. Occlusion left M3 branch supplying the left parietal lobe.  2. CT perfusion reveals 8 mL of core infarct in the left parietal  lobe with surrounding penumbra. Estimated 80 mL of penumbra in the  left hemisphere. CT perfusion also shows delayed perfusion in the  right frontal and parietal lobe which could be due to artifact as  there is no associated stenosis to this area.  3. No significant carotid or vertebral artery stenosis in the neck  4. These results were called by telephone at the time of  interpretation on 02/08/2022 at 7:00 pm to provider ERIC [facility] ,  who verbally acknowledged these results.  5. Asymmetric mucosal edema in the right paranasal sinuses involving  the right ethmoid and maxillary sinus with air-fluid level right  maxillary sinus. There is diffuse soft tissue  thickening in the  right nasal cavity which could be due to polyp or tumor. Recommend  direct visualization.     PROCEDURES:  Critical Care performed: No  Procedures   MEDICATIONS ORDERED IN ED: Medications  clevidipine (CLEVIPREX) infusion 0.5 mg/mL (2 mg/hr Intravenous New Bag/Given 02/08/22 1910)  tenecteplase (TNKASE) 50 MG injection for Stroke (19.5 mg  Given 02/08/22 1852)  iohexol (OMNIPAQUE) 350 MG/ML injection 100 mL (100 mLs Intravenous Contrast Given 02/08/22 1842)     IMPRESSION / MDM / ASSESSMENT AND PLAN / ED COURSE  I reviewed the triage vital signs and the nursing notes.                              Differential diagnosis includes, but is not limited to, CVA, intracranial bleed. Seziure.  Patient's presentation is most consistent with acute presentation with potential threat to life or bodily function.  Patient presented to the ER today because of concern for altered mental status. Patient was called a code stroke. Dr. Cheral Marker with neurology was present at bedside upon patient arrival to the ED. CT angio showed LVO. Patient was transferred to [facility] for IR intervention.   FINAL CLINICAL IMPRESSION(S) / ED DIAGNOSES   Final diagnoses:  Cerebrovascular accident (CVA), unspecified mechanism ([place])     Note:  This document was prepared using Dragon voice recognition software and may include unintentional dictation errors.    Nance Pear, MD 02/08/22 2147

## 2022-02-08 NOTE — Procedures (Signed)
INR 88 year old right-handed lady with new onset of global aphasia right-sided weakness, and right-sided visual deficit. SNF for resident.  Modified Rankin score unknown. NIHSS score 18. CT of brain.  No hemorrhage.  Aspects 10.  CTA occlusion of left MCA inferior division M3 branch.  CT perfusion scan core 8 mL.Penumbra 134 mL . Endovascular revascularization to prevent further neurologic ischemic injury discussed by examining neurologist with patient's daughter which was witnessed by nursing staff.  The procedure, the benefits, and potential risks particularly of intracranial hemorrhage of 10% with worsening neurological deficit reviewed.  Permission obtained over the phone from the daughter revascularization.  Examination. Left common carotid arteriogram.  Right CFA approach. Findings.  Occluded M3 branch of the inferior division of the left middle cerebral artery. Treatment.  Complete revascularization of the occluded vessel obtained with 1 pass with a 3 mm x 20 mm solitare X retrieval device, aspiration, and proximal flow arrest achieving aTICI 3 revascularization. Post procedure CT of the brain no evidence of intracranial hemorrhage.  A small hyperattenuation noted in the posterior insular region probably representing contrast stain.  No gross hemorrhage evident. 8 French Angio-Seal closure device used for hemostasis of the right groin puncture site.  Distal pulses all intact. Patient left intubated to protect airway due to the patient's medical condition. Arlean Hopping MD

## 2022-02-08 NOTE — Telephone Encounter (Signed)
Transition Care Management Unsuccessful Follow-up Telephone Call  Date of discharge and from where:  02/06/22 [facility]  Attempts:  1st Attempt  Reason for unsuccessful TCM follow-up call:  Unable to reach patient. Current ED pending status. Will follow.

## 2022-02-08 NOTE — Consult Note (Signed)
NEURO HOSPITALIST CONSULT NOTE   Requesting physician: Dr. Archie Balboa  Reason for Consult: Acute onset of right facial droop, right arm weakness, global aphasia and right visual field cut  History obtained from:  EMS, Family and Chart     HPI:                                                                                                                                          Linda [name] is an 88 y.o. female SNF resident with a PMHx of PAF with Eliquis discontinued 4 days ago due to severe nosebleed, CHF, HLD, HTN, parathyroid disease, prediabetes, TIAs, and cataract extractions who presents to the [facility] ED via EMS as a Code Stroke after sudden onset of right facial droop, right arm weakness, global aphasia and right visual field cut. On arrival to the ED, exam revealed the same deficits. CT head revealed no acute hypodensity with ASPECTS of 10.   Past Medical History:  Diagnosis Date   Anxiety    Arrhythmia    paroxysmal atrial fibrillation   CHF (congestive heart failure) (HCC)    Colon polyp    Diverticulitis    Dysrhythmia    GERD (gastroesophageal reflux disease)    Hard of hearing    Hoarseness    Hyperlipidemia    Hypertension    Kidney stone    Motion sickness    Parathyroid disease (HCC)    Parathyroidectomy    PONV (postoperative nausea and vomiting)    Pre-diabetes    PVC (premature ventricular contraction)    Skin cancer    Squamous cell carcinoma of skin 07/19/2021   right lower pretibia lateral, EDC   Squamous cell carcinoma of skin 10/13/2014   R ant neck - KA pattern   Squamous cell carcinoma of skin 09/15/2021   Left dorsal hand, shaved down to fat at time of biopsy so should already be removed.  Will observe for recurrence.   Stroke [facility])    TIA (transient ischemic attack) 04/2018   no deficitis   UTI (lower urinary tract infection)    Varicose veins of both lower extremities    Wears hearing aid in both ears     Past Surgical  History:  Procedure Laterality Date   ABDOMINAL HYSTERECTOMY     APPENDECTOMY     CATARACT EXTRACTION W/PHACO Left 03/05/2019   Procedure: CATARACT EXTRACTION PHACO AND INTRAOCULAR LENS PLACEMENT ([place])   0:57 15.6% 9.05;  Surgeon: Leandrew Koyanagi, MD;  Location: [facility];  Service: Ophthalmology;  Laterality: Left;  Diabetic - diet cotrolled   CATARACT EXTRACTION W/PHACO Right 04/09/2019   Procedure: CATARACT EXTRACTION PHACO AND INTRAOCULAR LENS PLACEMENT (IOC) RIGHT DIABETIC 00:47.6  15.9%  7.60;  Surgeon: Leandrew Koyanagi, MD;  Location: [facility];  Service: Ophthalmology;  Laterality: Right;   CHOLECYSTECTOMY  2002   ESOPHAGOGASTRODUODENOSCOPY (EGD) WITH PROPOFOL N/A 12/17/2019   Procedure: ESOPHAGOGASTRODUODENOSCOPY (EGD) WITH PROPOFOL;  Surgeon: Robert Bellow, MD;  Location: [facility] ENDOSCOPY;  Service: Endoscopy;  Laterality: N/A;   LITHOTRIPSY     MITRAL VALVE REPAIR  12/14/2020   PARATHYROIDECTOMY  2004   1 removed   ROTATOR CUFF REPAIR  2002   TEE WITHOUT CARDIOVERSION N/A 09/29/2020   Procedure: TRANSESOPHAGEAL ECHOCARDIOGRAM (TEE);  Surgeon: Teodoro Spray, MD;  Location: [facility] ORS;  Service: Cardiovascular;  Laterality: N/A;   TONSILLECTOMY  1956   TONSILLECTOMY     TOTAL ABDOMINAL HYSTERECTOMY W/ BILATERAL SALPINGOOPHORECTOMY  1984   VEIN LIGATION AND STRIPPING      Family History  Problem Relation Age of Onset   Heart disease Brother    Stroke Mother    Skin cancer Father    Breast cancer Paternal Grandmother              Social History:  reports that she has never smoked. She has never used smokeless tobacco. She reports that she does not drink alcohol and does not use drugs.  Allergies  Allergen Reactions   Atorvastatin Other (See Comments)    Myalgias    Barium Iodide     Unknown   Statins Other (See Comments)    Myalgias    Crestor [Rosuvastatin Calcium] Other (See Comments)    Myalgia    Penicillins Rash   Sulfa  Antibiotics Rash   Sulfonamide Derivatives Rash    HOME MEDICATIONS:                                                                                                                      No current facility-administered medications on file prior to encounter.   Current Outpatient Medications on File Prior to Encounter  Medication Sig Dispense Refill   acetaminophen (TYLENOL) 500 MG tablet Take 500 mg by mouth every 6 (six) hours as needed for moderate pain or headache.     blood glucose meter kit and supplies Dispense based on patient and insurance preference. Use up to four times daily as directed. (FOR ICD-10 E10.9, E11.9). 1 each 0   Blood Glucose Monitoring Suppl (ONETOUCH VERIO) w/Device KIT Use to check blood sugars up to 4 times daily. ICD 10: E11.29 1 kit 0   cephALEXin (KEFLEX) 500 MG capsule Take 1 capsule (500 mg total) by mouth every 8 (eight) hours for 5 days. 15 capsule 0   cetirizine (ZYRTEC) 10 MG tablet Take 10 mg by mouth daily as needed for allergies.     dapagliflozin propanediol (FARXIGA) 5 MG TABS tablet Every other day for 14 days,  then daily 30 tablet 0   furosemide (LASIX) 20 MG tablet Take 20 mg by mouth daily.     glucose blood test strip Use to check blood sugars up to 4 times daily. ICD 10: E11.29 400 each 0   Lancets (ONETOUCH  ULTRASOFT) lancets Use to check blood sugars up to 4 times daily. ICD-10: E11.29 400 each 0   losartan (COZAAR) 25 MG tablet Take 1 tablet (25 mg total) by mouth daily. 90 tablet 1   metoprolol succinate (TOPROL-XL) 25 MG 24 hr tablet Take 12.5 mg by mouth daily.     mometasone (ELOCON) 0.1 % cream Apply twice daily as needed to itchy spots at body. Avoid applying to face, groin, and axilla. Use as directed. Long-term use can cause thinning of the skin. 45 g 0   oxymetazoline (AFRIN) 0.05 % nasal spray Place 2 sprays into both nostrils 2 (two) times daily as needed (epistaxis only). 30 mL 0   potassium chloride SA (KLOR-CON M) 20 MEQ tablet  Take 1 tablet (20 mEq total) by mouth daily. 30 tablet 3   saline (AYR) GEL Place 1 Application into both nostrils every 6 (six) hours. 14.1 g 0   traZODone (DESYREL) 50 MG tablet Take 50 mg by mouth at bedtime.       ROS:                                                                                                                                       Unable to obtain due to aphasea.    There were no vitals taken for this visit.   General Examination:                                                                                                       Physical Exam  HEENT-  [place]/AT. Gauze plug present in right nare.     Lungs- Respirations unlabored Extremities- No edema  Neurological Examination Mental Status: Awake and alert. Globally aphasic with no speech output to any questions or other stimuli. Not following any commands. Mild right sided neglect.  Cranial Nerves: II: PERRL. No blink to threat on the right.  III,IV, VI: No ptosis. Eyes are conjugate and slightly to the left of midline, but can cross with some difficulty to the right while tracking examiner.  V: Blinks to eyelid stimulation bilaterally.  VII: Mild right facial droop. VIII: Unable to assess formally, but patient does tend to alert to some vocal stimuli IX,X: No hypophonia or hoarseness XI: Head rotated preferentially to the left.  XII: Does not protrude tongue to command Motor/Sensory: RUE and RLE drift more rapidly to bed than LUE and LLE when passively elevated and released. Also with less brisk responses to RUE and RLE  to pinch than on the left.  Deep Tendon Reflexes: 2+ and symmetric throughout Plantars: Right: upgoing   Left: downgoing Cerebellar: Unable to assess formally due to comprehension deficit, but no gross ataxia disproportionate to her weakness is seen Gait: Unable to assess  NIHSS: 18   Lab Results: Basic Metabolic Panel: Recent Labs  Lab 02/05/22 0003 02/06/22 0407  NA 138 140  K  3.7 3.6  CL 104 103  CO2 26 31  GLUCOSE 164* 137*  BUN 22 18  CREATININE 0.66 0.71  CALCIUM 9.2 9.0    CBC: Recent Labs  Lab 02/05/22 0003 02/05/22 0336 02/06/22 0407  WBC 5.1 6.9 6.4  HGB 15.8* 14.9 15.5*  HCT 47.8* 45.2 47.5*  MCV 97.6 97.8 97.3  PLT 156 152 163    Cardiac Enzymes: No results for input(s): "CKTOTAL", "CKMB", "CKMBINDEX", "TROPONINI" in the last 168 hours.  Lipid Panel: No results for input(s): "CHOL", "TRIG", "HDL", "CHOLHDL", "VLDL", "LDLCALC" in the last 168 hours.  Imaging: No results found.   Assessment:  88 y.o. female SNF resident with a PMHx of PAF with Eliquis discontinued 4 days ago due to severe nosebleed, CHF, HLD, HTN, parathyroid disease, prediabetes, TIAs, and cataract extractions who presents to the [facility] ED via EMS as a Code Stroke after sudden onset of right facial droop, right arm weakness, global aphasia and right visual field cut. On arrival to the ED, exam revealed the same deficits.  - Exam reveals findings most consistent with a large left MCA territory ischemic infarction. NIHSS of 18.  - CT head: Atrophy and chronic microvascular ischemic change in the white matter. No acute abnormality. ASPECTS is 10 - CTA of head and neck: Occlusion left M3 branch supplying the left parietal lobe. No significant carotid or vertebral artery stenosis in the neck. - CTP: CT perfusion reveals 8 mL of core infarct in the left parietal lobe with surrounding penumbra. Estimated 80 mL of penumbra in the left hemisphere. CT perfusion also shows delayed perfusion in the right frontal and parietal lobe which could be due to artifact as there is no associated stenosis to this area. - After comprehensive review of possible contraindications, she has no absolute contraindications to TNK administration. Patient is an IV thrombolysis candidate. Discussed extensively the risks/benefits of IV thrombolysis treatment vs. no treatment with the patient's daughter Cari Caraway [phone]), including risks of hemorrhage and death with IV thrombolysis administration versus worse overall outcomes on average in patients within thrombolysis time window who are not administered TNK. Patient's aphasia precludes meaningful medical decision making on her part at this time. Overall benefits of TNK regarding long-term prognosis are felt to outweigh risks. The patient's family expressed understanding and wish to proceed with TNK. Increased risk of rebleeding of her nasal hemorrhage also discussed and risks of such are significantly outweighed by benefits on TNK. Consent witnessed by patient's RN and CT technician. - The patient is a VIR candidate. Risks/benefits of the procedure were discussed extensively with patient's daughter, including approximately 50% chance of significant improvement relative to an approximate 10% chance of subarachnoid hemorrhage with possibility of significant worsening including death. The patient's daughter expressed understanding and provided informed consent to proceed with VIR. All questions answered.    Recommendations: - Being transferred to [facility] for endovascular thrombectomy. Discussed with Dr. Curly Shores and Dr. Estanislado Pandy. - Will need to be admitted to the 4N ICU at [facility] under the Neurology service after thrombectomy.   90 minutes spent in the emergent neurological  evaluation and management of this critically ill patient. Time spent included coordination of care.     Electronically signed: Dr. Kerney Elbe 02/08/2022, 6:26 PM

## 2022-02-08 NOTE — Transfer of Care (Signed)
Immediate Anesthesia Transfer of Care Note  Patient: Linda [name]  Procedure(s) Performed: IR WITH ANESTHESIA  Patient Location: ICU  Anesthesia Type:General  Level of Consciousness: Patient remains intubated per anesthesia plan  Airway & Oxygen Therapy: Patient remains intubated per anesthesia plan and Patient placed on Ventilator (see vital sign flow sheet for setting)  Post-op Assessment: Report given to RN and Post -op Vital signs reviewed and stable  Post vital signs: Reviewed and stable  Last Vitals:  Vitals Value Taken Time  BP 131/75 02/08/22 2236  Temp    Pulse 71 02/08/22 2236  Resp 17 02/08/22 2236  SpO2 97 % 02/08/22 2236  Vitals shown include unvalidated device data.  Last Pain: There were no vitals filed for this visit.       Complications: No notable events documented.

## 2022-02-08 NOTE — Progress Notes (Signed)
Beside report given to [facility] by CRNA myself

## 2022-02-08 NOTE — ED Notes (Signed)
Pt physically left with transport at [place]

## 2022-02-08 NOTE — Consult Note (Signed)
NAME:  Linda [name], MRN:  [id number], DOB:  December 07, 1933, LOS: 0 ADMISSION DATE:  02/08/2022, CONSULTATION DATE:  8/23 REFERRING MD:  Dr. Curly Shores, CHIEF COMPLAINT:  CVA   History of Present Illness:  88 year old female SNF resident with [place] as below, which is significant for HFrEF (40-45%), severe MR s/p mitraclip, AF on eliquis, and DM. Recently admitted to [facility] for epistaxis, which was treated with packing in the ED. She was evaluated by ENT, who decided to leave packing in place until outpatient follow up. Eliquis was held. The then presented again to [facility] ED 8/23 with complaints of right facial droop, right arm weakness, global aphasia and right visual field cut. She was evaluated by neurology per code stroke protocol. CT head non-acute. She was administered IV TNK. Further imaging demonstrated occlusion of the left M3 with 188m penumbra. [facility]. Procedure was uncomplicated and yeilded a TICI 3 result. Post operatively she was transferred to the ICU on the mechanical ventilator. PCCM asked to consult for medical and vent management.   Pertinent  Medical History   has a past medical history of Anxiety, Arrhythmia, CHF (congestive heart failure) ([place], Colon polyp, Diverticulitis, Dysrhythmia, GERD (gastroesophageal reflux disease), Hard of hearing, Hoarseness, Hyperlipidemia, Hypertension, Kidney stone, Motion sickness, [place]roid disease ([place], PONV (postoperative nausea and vomiting), Pre-diabetes, PVC (premature ventricular contraction), Skin cancer, Squamous cell carcinoma of skin (07/19/2021), Squamous cell carcinoma of skin (10/13/2014), Squamous cell carcinoma of skin (09/15/2021), [place] ([place], TIA (transient ischemic attack) (04/2018), UTI (lower urinary tract infection), Varicose veins of both lower extremities, and Wears hearing aid in both ears.   Significant Hospital Events: Including procedures, antibiotic start and stop dates in  addition to other pertinent events   8/20 -8/21 admit for epistaxis. Discharged with packing in place. Eliquis held 8/23 admit for L MCA CVA. TNK given. Mechanical thrombectomy with TICI 3  Interim History / Subjective:    Objective   There were no vitals taken for this visit.        Intake/Output Summary (Last 24 hours) at 02/08/2022 2221 Last data filed at 02/08/2022 2207 Gross per 24 hour  Intake 1550 ml  Output 750 ml  Net 800 ml   There were no vitals filed for this visit.  Examination: General: elderly female in NAD on vent HENT: [place]/AT, PERRL, no JVD Lungs: Clear bilateral breath sounds Cardiovascular: RRR, no MRG Abdomen: Soft, non-distended Extremities: No acute deformity. Distal pulses intact.  Neuro: Sedated RASS -3 GU: Foley  Resolved Hospital Problem list     Assessment & Plan:   CVA; Left M3 occlusion while Eliquis on hold now s/p TNK and mechanical thrombectomy 8/23 - Management per stroke service. - Echo - MRI - SBP goal 120-130 mmHg - PRN cleviprex/phenylephrine  - Carotid dopplers  Endotracheal tube present - Full vent support - CXR, ABG - VAP bundle - Hopeful for SBT in the AM  Chronic HFrEF: LVEF improved to 50-55% by TEE from 2022. - TTE pending - holding losartan, lasix for tonight.  - Strict I&O  Atrial fibrillation - Holding Eliquis for epistaxis - Telemetry monitoring  Epistaxis - Continue Keflex through 8/26 - Packing remains in place. ENT outpatient follow up pending for removal. Could consider removing while inpatient, reassess once TNK clears.  - Continue to hold Eliquis.   DM - CBG monitoring and SSI - Hold home farxiga  Best Practice (right click and "Reselect all SmartList Selections" daily)  Diet/type: NPO DVT prophylaxis: not indicated GI prophylaxis: PPI Lines: N/A Foley:  N/A and Yes, and it is still needed Code Status:  full code Last date of multidisciplinary goals of care discussion [ ]   Labs    CBC: Recent Labs  Lab 02/05/22 0003 02/05/22 0336 02/06/22 0407 02/08/22 1851  WBC 5.1 6.9 6.4 6.5  NEUTROABS  --   --   --  4.1  HGB 15.8* 14.9 15.5* 16.6*  HCT 47.8* 45.2 47.5* 51.4*  MCV 97.6 97.8 97.3 98.5  PLT 156 152 163 767    Basic Metabolic Panel: Recent Labs  Lab 02/05/22 0003 02/06/22 0407 02/08/22 1919  NA 138 140 138  K 3.7 3.6 4.0  CL 104 103 102  CO2 26 31 26   GLUCOSE 164* 137* 124*  BUN 22 18 24*  CREATININE 0.66 0.71 0.72  CALCIUM 9.2 9.0 9.3   GFR: Estimated Creatinine Clearance: 49.9 mL/min (by C-G formula based on SCr of 0.72 mg/dL). Recent Labs  Lab 02/05/22 0003 02/05/22 0336 02/06/22 0407 02/08/22 1851  PROCALCITON <0.10  --   --   --   WBC 5.1 6.9 6.4 6.5  LATICACIDVEN  --  0.9  --   --     Liver Function Tests: Recent Labs  Lab 02/08/22 1919  AST 27  ALT 23  ALKPHOS 76  BILITOT 1.7*  PROT 6.7  ALBUMIN 3.9   No results for input(s): "LIPASE", "AMYLASE" in the last 168 hours. No results for input(s): "AMMONIA" in the last 168 hours.  ABG No results found for: "PHART", "PCO2ART", "PO2ART", "HCO3", "TCO2", "ACIDBASEDEF", "O2SAT"   Coagulation Profile: Recent Labs  Lab 02/05/22 0003 02/08/22 1919  INR 1.6* 1.3*    Cardiac Enzymes: No results for input(s): "CKTOTAL", "CKMB", "CKMBINDEX", "TROPONINI" in the last 168 hours.  HbA1C: Hemoglobin A1C  Date/Time Value Ref Range Status  07/21/2021 10:39 AM 6.1 (A) 4.0 - 5.6 % Final   Hgb A1c MFr Bld  Date/Time Value Ref Range Status  10/19/2021 10:07 AM 7.0 (H) 4.6 - 6.5 % Final    Comment:    Glycemic Control Guidelines for People with Diabetes:Non Diabetic:  <6%Goal of Therapy: <7%Additional Action Suggested:  >8%   09/09/2020 08:39 AM 7.2 (H) 4.6 - 6.5 % Final    Comment:    Glycemic Control Guidelines for People with Diabetes:Non Diabetic:  <6%Goal of Therapy: <7%Additional Action Suggested:  >8%     CBG: Recent Labs  Lab 02/05/22 1623 02/05/22 2053  02/06/22 0836 02/06/22 1143 02/08/22 1821  GLUCAP 89 97 149* 114* 110*    Review of Systems:   Patient is encephalopathic and/or intubated. Therefore history has been obtained from chart review.   Past Medical History:  She,  has a past medical history of Anxiety, Arrhythmia, CHF (congestive heart failure) ([place]), Colon polyp, Diverticulitis, Dysrhythmia, GERD (gastroesophageal reflux disease), Hard of hearing, Hoarseness, Hyperlipidemia, Hypertension, Kidney stone, Motion sickness, Parathyroid disease ([place]), PONV (postoperative nausea and vomiting), Pre-diabetes, PVC (premature ventricular contraction), Skin cancer, Squamous cell carcinoma of skin (07/19/2021), Squamous cell carcinoma of skin (10/13/2014), Squamous cell carcinoma of skin (09/15/2021), Stroke ([place]), TIA (transient ischemic attack) (04/2018), UTI (lower urinary tract infection), Varicose veins of both lower extremities, and Wears hearing aid in both ears.   Surgical History:   Past Surgical History:  Procedure Laterality Date   ABDOMINAL HYSTERECTOMY     APPENDECTOMY     CATARACT EXTRACTION W/PHACO Left 03/05/2019   Procedure: CATARACT EXTRACTION PHACO AND INTRAOCULAR LENS  PLACEMENT (IOC)   0:57 15.6% 9.05;  Surgeon: Leandrew Koyanagi, MD;  Location: [place];  Service: Ophthalmology;  Laterality: Left;  Diabetic - diet cotrolled   CATARACT EXTRACTION W/PHACO Right 04/09/2019   Procedure: CATARACT EXTRACTION PHACO AND INTRAOCULAR LENS PLACEMENT ([facility]) RIGHT DIABETIC 00:47.6  15.9%  7.60;  Surgeon: Leandrew Koyanagi, MD;  Location: [place];  Service: Ophthalmology;  Laterality: Right;   CHOLECYSTECTOMY  2002   ESOPHAGOGASTRODUODENOSCOPY (EGD) WITH PROPOFOL N/A 12/17/2019   Procedure: ESOPHAGOGASTRODUODENOSCOPY (EGD) WITH PROPOFOL;  Surgeon: Robert Bellow, MD;  Location: [facility];  Service: Endoscopy;  Laterality: N/A;   LITHOTRIPSY     MITRAL VALVE REPAIR  12/14/2020   PARATHYROIDECTOMY   2004   1 removed   ROTATOR CUFF REPAIR  2002   TEE WITHOUT CARDIOVERSION N/A 09/29/2020   Procedure: TRANSESOPHAGEAL ECHOCARDIOGRAM (TEE);  Surgeon: Teodoro Spray, MD;  Location: [facility] ORS;  Service: Cardiovascular;  Laterality: N/A;   TONSILLECTOMY  1956   TONSILLECTOMY     TOTAL ABDOMINAL HYSTERECTOMY W/ BILATERAL SALPINGOOPHORECTOMY  1984   VEIN LIGATION AND STRIPPING       Social History:   reports that she has never smoked. She has never used smokeless tobacco. She reports that she does not drink alcohol and does not use drugs.   Family History:  Her family history includes Breast cancer in her paternal grandmother; Heart disease in her brother; Skin cancer in her father; Stroke in her mother.   Allergies Allergies  Allergen Reactions   Atorvastatin Other (See Comments)    Myalgias    Barium Iodide     Unknown   Statins Other (See Comments)    Myalgias    Crestor [Rosuvastatin Calcium] Other (See Comments)    Myalgia    Penicillins Rash   Sulfa Antibiotics Rash   Sulfonamide Derivatives Rash     Home Medications  Prior to Admission medications   Medication Sig Start Date End Date Taking? Authorizing Provider  acetaminophen (TYLENOL) 500 MG tablet Take 500 mg by mouth every 6 (six) hours as needed for moderate pain or headache.    [provider]  blood glucose meter kit and supplies Dispense based on patient and insurance preference. Use up to four times daily as directed. (FOR ICD-10 E10.9, E11.9). 11/24/21   Crecencio Mc, MD  Blood Glucose Monitoring Suppl (ONETOUCH VERIO) w/Device KIT Use to check blood sugars up to 4 times daily. ICD 10: E11.29 11/29/21   Crecencio Mc, MD  cephALEXin (KEFLEX) 500 MG capsule Take 1 capsule (500 mg total) by mouth every 8 (eight) hours for 5 days. 02/06/22 02/11/22  Loletha Grayer, MD  cetirizine (ZYRTEC) 10 MG tablet Take 10 mg by mouth daily as needed for allergies.    [provider]  dapagliflozin  propanediol (FARXIGA) 5 MG TABS tablet Every other day for 14 days,  then daily 11/24/21   Crecencio Mc, MD  furosemide (LASIX) 20 MG tablet Take 20 mg by mouth daily. 01/07/20   [provider]  glucose blood test strip Use to check blood sugars up to 4 times daily. ICD 10: E11.29 11/29/21   Crecencio Mc, MD  Lancets [facility] ULTRASOFT) lancets Use to check blood sugars up to 4 times daily. ICD-10: E11.29 11/29/21   Crecencio Mc, MD  losartan (COZAAR) 25 MG tablet Take 1 tablet (25 mg total) by mouth daily. 07/21/21   Crecencio Mc, MD  metoprolol succinate (TOPROL-XL) 25 MG 24  hr tablet Take 12.5 mg by mouth daily. 07/14/21   [provider]  mometasone (ELOCON) 0.1 % cream Apply twice daily as needed to itchy spots at body. Avoid applying to face, groin, and axilla. Use as directed. Long-term use can cause thinning of the skin. 12/12/21   Brendolyn Patty, MD  oxymetazoline (AFRIN) 0.05 % nasal spray Place 2 sprays into both nostrils 2 (two) times daily as needed (epistaxis only). 02/06/22   Loletha Grayer, MD  potassium chloride SA (KLOR-CON M) 20 MEQ tablet Take 1 tablet (20 mEq total) by mouth daily. 10/22/21   Crecencio Mc, MD  saline (AYR) GEL Place 1 Application into both nostrils every 6 (six) hours. 02/06/22   Loletha Grayer, MD  traZODone (DESYREL) 50 MG tablet Take 50 mg by mouth at bedtime. 05/05/20   [provider]     Critical care time: 32 minutes     Georgann Housekeeper, AGACNP-BC [facility]  See Amion for personal pager PCCM on call pager [phone] until 7pm. Please call Elink 7p-7a. [phone]  02/08/2022 11:21 PM

## 2022-02-08 NOTE — ED Notes (Signed)
Blood drawn and pt taken to CT1 with this RN and neurologist [employer]

## 2022-02-08 NOTE — ED Notes (Signed)
Pt daughter Cari Caraway  gave verbal consent to myself and neurologist for procedure and transfer.

## 2022-02-09 ENCOUNTER — Encounter (HOSPITAL_COMMUNITY): Payer: Self-pay | Admitting: Interventional Radiology

## 2022-02-09 ENCOUNTER — Inpatient Hospital Stay (HOSPITAL_COMMUNITY): Payer: Medicare Other

## 2022-02-09 DIAGNOSIS — I5022 Chronic systolic (congestive) heart failure: Secondary | ICD-10-CM

## 2022-02-09 DIAGNOSIS — I6602 Occlusion and stenosis of left middle cerebral artery: Secondary | ICD-10-CM

## 2022-02-09 DIAGNOSIS — I4821 Permanent atrial fibrillation: Secondary | ICD-10-CM

## 2022-02-09 DIAGNOSIS — Z978 Presence of other specified devices: Secondary | ICD-10-CM | POA: Diagnosis not present

## 2022-02-09 DIAGNOSIS — Z8673 Personal history of transient ischemic attack (TIA), and cerebral infarction without residual deficits: Secondary | ICD-10-CM | POA: Diagnosis not present

## 2022-02-09 DIAGNOSIS — I63512 Cerebral infarction due to unspecified occlusion or stenosis of left middle cerebral artery: Secondary | ICD-10-CM | POA: Diagnosis not present

## 2022-02-09 LAB — CBC WITH DIFFERENTIAL/PLATELET
Abs Immature Granulocytes: 0.02 10*3/uL (ref 0.00–0.07)
Basophils Absolute: 0 10*3/uL (ref 0.0–0.1)
Basophils Relative: 1 %
Eosinophils Absolute: 0 10*3/uL (ref 0.0–0.5)
Eosinophils Relative: 0 %
HCT: 43.6 % (ref 36.0–46.0)
Hemoglobin: 14.6 g/dL (ref 12.0–15.0)
Immature Granulocytes: 1 %
Lymphocytes Relative: 11 %
Lymphs Abs: 0.5 10*3/uL — ABNORMAL LOW (ref 0.7–4.0)
MCH: 32.7 pg (ref 26.0–34.0)
MCHC: 33.5 g/dL (ref 30.0–36.0)
MCV: 97.5 fL (ref 80.0–100.0)
Monocytes Absolute: 0.1 10*3/uL (ref 0.1–1.0)
Monocytes Relative: 3 %
Neutro Abs: 3.7 10*3/uL (ref 1.7–7.7)
Neutrophils Relative %: 84 %
Platelets: 146 10*3/uL — ABNORMAL LOW (ref 150–400)
RBC: 4.47 MIL/uL (ref 3.87–5.11)
RDW: 13.4 % (ref 11.5–15.5)
WBC: 4.3 10*3/uL (ref 4.0–10.5)
nRBC: 0 % (ref 0.0–0.2)

## 2022-02-09 LAB — ECHOCARDIOGRAM COMPLETE
AR max vel: 2.31 cm2
AV Area VTI: 2.23 cm2
AV Area mean vel: 2.23 cm2
AV Mean grad: 4 mmHg
AV Peak grad: 8.1 mmHg
Ao pk vel: 1.42 m/s
Height: 63 in
MV M vel: 5.51 m/s
MV Peak grad: 121.4 mmHg
MV VTI: 1.26 cm2
Radius: 0.3 cm
S' Lateral: 4.1 cm
Single Plane A4C EF: 50.5 %
Weight: 2751.34 oz

## 2022-02-09 LAB — POCT I-STAT 7, (LYTES, BLD GAS, ICA,H+H)
Acid-Base Excess: 5 mmol/L — ABNORMAL HIGH (ref 0.0–2.0)
Bicarbonate: 30.6 mmol/L — ABNORMAL HIGH (ref 20.0–28.0)
Calcium, Ion: 1.1 mmol/L — ABNORMAL LOW (ref 1.15–1.40)
HCT: 45 % (ref 36.0–46.0)
Hemoglobin: 15.3 g/dL — ABNORMAL HIGH (ref 12.0–15.0)
O2 Saturation: 99 %
Patient temperature: 98.6
Potassium: 4.1 mmol/L (ref 3.5–5.1)
Sodium: 138 mmol/L (ref 135–145)
TCO2: 32 mmol/L (ref 22–32)
pCO2 arterial: 49.2 mmHg — ABNORMAL HIGH (ref 32–48)
pH, Arterial: 7.402 (ref 7.35–7.45)
pO2, Arterial: 157 mmHg — ABNORMAL HIGH (ref 83–108)

## 2022-02-09 LAB — BASIC METABOLIC PANEL
Anion gap: 9 (ref 5–15)
BUN: 13 mg/dL (ref 8–23)
CO2: 22 mmol/L (ref 22–32)
Calcium: 8.4 mg/dL — ABNORMAL LOW (ref 8.9–10.3)
Chloride: 106 mmol/L (ref 98–111)
Creatinine, Ser: 0.57 mg/dL (ref 0.44–1.00)
GFR, Estimated: >60 mL/min (ref >60)
Glucose, Bld: 204 mg/dL — ABNORMAL HIGH (ref 70–99)
Potassium: 3.7 mmol/L (ref 3.5–5.1)
Sodium: 137 mmol/L (ref 135–145)

## 2022-02-09 LAB — GLUCOSE, CAPILLARY
Glucose-Capillary: 189 mg/dL — ABNORMAL HIGH (ref 70–99)
Glucose-Capillary: 186 mg/dL — ABNORMAL HIGH (ref 70–99)
Glucose-Capillary: 149 mg/dL — ABNORMAL HIGH (ref 70–99)
Glucose-Capillary: 138 mg/dL — ABNORMAL HIGH (ref 70–99)
Glucose-Capillary: 117 mg/dL — ABNORMAL HIGH (ref 70–99)
Glucose-Capillary: 139 mg/dL — ABNORMAL HIGH (ref 70–99)

## 2022-02-09 LAB — HEMOGLOBIN A1C
Hgb A1c MFr Bld: 6.6 % — ABNORMAL HIGH (ref 4.8–5.6)
Mean Plasma Glucose: 142.72 mg/dL

## 2022-02-09 LAB — LIPID PANEL
Cholesterol: 130 mg/dL (ref 0–200)
HDL: 48 mg/dL (ref >40)
LDL Cholesterol: 77 mg/dL (ref 0–99)
Total CHOL/HDL Ratio: 2.7 RATIO
Triglycerides: 25 mg/dL (ref <150)
VLDL: 5 mg/dL (ref 0–40)

## 2022-02-09 LAB — MRSA NEXT GEN BY PCR, NASAL: MRSA by PCR Next Gen: NOT DETECTED

## 2022-02-09 MED ORDER — DEXMEDETOMIDINE HCL IN NACL 400 MCG/100ML IV SOLN
0.4000 ug/kg/h | INTRAVENOUS | Status: DC
  Administered 2022-02-09: 0.4 ug/kg/h via INTRAVENOUS
  Filled 2022-02-09: qty 100

## 2022-02-09 MED ORDER — EZETIMIBE 10 MG PO TABS
10.0000 mg | ORAL_TABLET | Freq: Every day | ORAL | Status: DC
  Administered 2022-02-09 – 2022-02-10 (×2): 10 mg
  Filled 2022-02-09 (×3): qty 1

## 2022-02-09 MED ORDER — LACTATED RINGERS IV BOLUS: 500.0000 mL | Freq: Once | INTRAVENOUS | Status: DC

## 2022-02-09 MED ORDER — SODIUM CHLORIDE 0.9 % IV BOLUS
500.0000 mL | Freq: Once | INTRAVENOUS | Status: AC
  Administered 2022-02-09: 500 mL via INTRAVENOUS

## 2022-02-09 MED ORDER — POTASSIUM CHLORIDE 20 MEQ PO PACK
40.0000 meq | PACK | Freq: Once | ORAL | Status: AC
  Administered 2022-02-09: 40 meq
  Filled 2022-02-09: qty 2

## 2022-02-09 MED ORDER — MIDAZOLAM HCL 2 MG/2ML IJ SOLN
INTRAMUSCULAR | Status: AC
  Administered 2022-02-09: 2 mg
  Filled 2022-02-09: qty 2

## 2022-02-09 MED ORDER — SODIUM CHLORIDE 0.9 % IV SOLN: INTRAVENOUS | Status: DC

## 2022-02-09 NOTE — Progress Notes (Signed)
SLP Cancellation Note  Patient Details Name: Linda [name] MRN: [id number] DOB: 1934-04-03   Cancelled treatment:       Reason Eval/Treat Not Completed: Patient not medically ready. She remains on the vent with sedation this morning, per chart, appears to be working toward weaning. Will f/u as able.    Osie Bond., M.A. [place] Office [phone]  Secure chat preferred  02/09/2022, 7:38 AM

## 2022-02-09 NOTE — Progress Notes (Signed)
eLink Physician-Brief Progress Note Patient Name: Linda [name] DOB: 12-06-1933 MRN: [id number]   Date of Service  02/09/2022  HPI/Events of Note  Agitation - Patient reaching for ETT. Nursing request for bilateral wrist restraints.   eICU Interventions  Will order bilateral soft wrist restraints X 10 hours.      Intervention Category Major Interventions: Delirium, psychosis, severe agitation - evaluation and management  [name] 02/09/2022, 11:53 PM

## 2022-02-09 NOTE — Telephone Encounter (Signed)
Transition Care Management Unsuccessful Follow-up Telephone Call  Date of discharge and from where:  02/06/22 [facility]  Attempts:  2nd Attempt  Reason for unsuccessful TCM follow-up call:  Left voice message. HFU scheduled 02/13/22 at 11:00. Keep all scheduled appointments.

## 2022-02-09 NOTE — Progress Notes (Signed)
PT Cancellation Note  Patient Details Name: Linda [name] MRN: [id number] DOB: [birth year]   Cancelled Treatment:    Reason Eval/Treat Not Completed: Active bedrest order; will continue attempts and initiate when activity upgraded.    Reginia Naas 02/09/2022, 8:58 AM Magda Kiel, PT Acute Rehabilitation Services Office:[phone] 02/09/2022

## 2022-02-09 NOTE — Progress Notes (Signed)
NAME:  Linda [name], MRN:  [id number], DOB:  03-28-1934, LOS: 1 ADMISSION DATE:  02/08/2022, CONSULTATION DATE:  8/23 REFERRING MD:  Dr. Curly Shores, CHIEF COMPLAINT:  CVA   History of Present Illness:  88 year old female SNF resident with [place] as below, which is significant for HFrEF (40-45%), severe MR s/p mitraclip, AF on eliquis, and DM. Recently admitted to [facility] for epistaxis, which was treated with packing in the ED. She was evaluated by ENT, who decided to leave packing in place until outpatient follow up. Eliquis was held. The then presented again to [facility] ED 8/23 with complaints of right facial droop, right arm weakness, global aphasia and right visual field cut. She was evaluated by neurology per code stroke protocol. CT head non-acute. She was administered IV TNK. Further imaging demonstrated occlusion of the left M3 with 141m penumbra. [facility]. Procedure was uncomplicated and yeilded a TICI 3 result. Post operatively she was transferred to the ICU on the mechanical ventilator. PCCM asked to consult for medical and vent management.   Pertinent  Medical History   has a past medical history of Anxiety, Arrhythmia, CHF (congestive heart failure) ([place], Colon polyp, Diverticulitis, Dysrhythmia, GERD (gastroesophageal reflux disease), Hard of hearing, Hoarseness, Hyperlipidemia, Hypertension, Kidney stone, Motion sickness, Parathyroid disease ([place], PONV (postoperative nausea and vomiting), Pre-diabetes, PVC (premature ventricular contraction), Skin cancer, Squamous cell carcinoma of skin (07/19/2021), Squamous cell carcinoma of skin (10/13/2014), Squamous cell carcinoma of skin (09/15/2021), Stroke ([place], TIA (transient ischemic attack) (04/2018), UTI (lower urinary tract infection), Varicose veins of both lower extremities, and Wears hearing aid in both ears.  Significant Hospital Events: Including procedures, antibiotic start and stop dates in  addition to other pertinent events   8/20 -8/21 admit for epistaxis. Discharged with packing in place. Eliquis held 8/23 admit for L MCA CVA. TNK given. Mechanical thrombectomy with TICI 3 8/24 Some issues with hemodynamics overnight, stable this am on assessment.   Interim History / Subjective:  Sleepy on vent this am sedation currently off. Flick of eye opening to painful stimuli, resist pupillary exam   Objective   Blood pressure 96/65, pulse (!) 48, temperature 97.9 F (36.6 C), temperature source Axillary, resp. rate 18, SpO2 100 %.    Vent Mode: PRVC FiO2 (%):  [40 %-50 %] 40 % Set Rate:  [18 bmp] 18 bmp Vt Set:  [410 mL] 410 mL PEEP:  [5 cmH20] 5 cmH20 Plateau Pressure:  [15 cmH20-19 cmH20] 17 cmH20   Intake/Output Summary (Last 24 hours) at 02/09/2022 0659 Last data filed at 02/09/2022 0600 Gross per 24 hour  Intake 2021.01 ml  Output 1650 ml  Net 371.01 ml    There were no vitals filed for this visit.  Examination: General: Acute on chronically ill appearing elderly female lying in bed on mechanical ventilation, in NAD HEENT: ETT, MM pink/moist,  Neuro: Sleepy on vent this am sedation currently off. Flick of eye opening to painful stimuli, resist pupillary exam  CV: s1s2 regular rate and rhythm, no murmur, rubs, or gallops,  PULM:  Clear to ascultation, no increased work of breathing, no added breath sounds, tolerating vent  GI: soft, bowel sounds active in all 4 quadrants, non-tender, non-distended Extremities: warm/dry, no edema  Skin: no rashes or lesions  Resolved Hospital Problem list     Assessment & Plan:   CVA; Left M3 occlusion while Eliquis on hold now  -s/p TNK and mechanical thrombectomy 8/23 P: Management  per neurology  Maintain neuro protective measures; goal for eurothermia, euglycemia, eunatermia, normoxia, and PCO2 goal of 35-40 Nutrition and bowel regiment  Seizure precautions  AEDs per neurology  Aspirations precautions  MRI brain pending   BP parameters per Neuro  ECHO pending   Post procedure intubation  -Remained intubated post thrombectomy  P: Continue ventilator support with lung protective strategies  SBT this am with hopes, to extubate soon  Wean PEEP and FiO2 for sats greater than 90%. Head of bed elevated 30 degrees. Plateau pressures less than 30 cm H20.  Follow intermittent chest x-ray and ABG.   Ensure adequate pulmonary hygiene  Follow cultures  VAP bundle in place  PAD protocol  Chronic HFrEF -LVEF improved to 50-55% by TEE from 2022. P: Continuous telemetry  Monitor BNP Strict intake and output  Daily weight to assess volume status Daily assessment for need to diurese  Closely monitor renal function and electrolytes  Vent support as above  Ensure hemodynamic control, hold home medications  ECHO pending  Atrial fibrillation -Generally anticoagulated with Eliquis, but held due to epistaxis as below  P: Continue to hold Eliquis as below  Telemetry   Epistaxis with current packing in place -Packing remains in place. ENT outpatient follow up pending for removal. Could consider removing while inpatient, reassess once TNK clears.  P: Continue Keflex through 8/26 Eliquis remains on hold as above   Type 2 diabetes  P: Continue SSI  CBG q 4  Continue to hold Farxiga  CBG goal 140-180  Best Practice (right click and "Reselect all SmartList Selections" daily)   Diet/type: NPO DVT prophylaxis: not indicated GI prophylaxis: PPI Lines: N/A Foley:  N/A and Yes, and it is still needed Code Status:  full code Last date of multidisciplinary goals of care discussion: Pending family discussion this am  Critical care time: 38 minutes  Mekesha Solomon D. Kenton Kingfisher, NP-C [facility] Personal contact information can be found on Amion  02/09/2022, 7:12 AM

## 2022-02-09 NOTE — Progress Notes (Signed)
Supervising Physician: Luanne Bras  Patient Status:  [facility] - In-pt  Chief Complaint:  Code Stroke, left M3 occlusion, s/p TNK and mechanical thrombectomy  Subjective:  Patient independently evaluated by Dr. Estanislado Pandy.   Sedated and ventilated.  OG tube in place.  Nasal packing in place.  Allergies: Barium iodide, Lipitor [atorvastatin], Statins, Crestor [rosuvastatin calcium], Penicillins, Sulfa antibiotics, and Sulfonamide derivatives  Medications: Prior to Admission medications   Medication Sig Start Date End Date Taking? Authorizing Provider  acetaminophen (TYLENOL) 500 MG tablet Take 500 mg by mouth every 6 (six) hours as needed for moderate pain or headache.   Yes [provider]  cephALEXin (KEFLEX) 500 MG capsule Take 1 capsule (500 mg total) by mouth every 8 (eight) hours for 5 days. Patient taking differently: Take 500 mg by mouth every 8 (eight) hours. 5 day course. 02/06/22 02/11/22 Yes Wieting, Richard, MD  cetirizine (ZYRTEC) 10 MG tablet Take 10 mg by mouth daily as needed for allergies.   Yes [provider]  furosemide (LASIX) 20 MG tablet Take 20 mg by mouth daily. 01/07/20  Yes [provider]  losartan (COZAAR) 25 MG tablet Take 1 tablet (25 mg total) by mouth daily. 07/21/21  Yes Crecencio Mc, MD  metoprolol succinate (TOPROL-XL) 25 MG 24 hr tablet Take 12.5 mg by mouth daily. 07/14/21  Yes [provider]  mometasone (ELOCON) 0.1 % cream Apply twice daily as needed to itchy spots at body. Avoid applying to face, groin, and axilla. Use as directed. Long-term use can cause thinning of the skin. Patient taking differently: Apply 1 Application topically 2 (two) times daily as needed (itchy spots). 12/12/21  Yes Brendolyn Patty, MD  oxymetazoline (AFRIN) 0.05 % nasal spray Place 2 sprays into both nostrils 2 (two) times daily as needed (epistaxis only). 02/06/22  Yes Wieting, Richard, MD  potassium chloride SA (KLOR-CON M) 20 MEQ  tablet Take 1 tablet (20 mEq total) by mouth daily. 10/22/21  Yes Crecencio Mc, MD  saline (AYR) GEL Place 1 Application into both nostrils every 6 (six) hours. 02/06/22  Yes Wieting, Richard, MD  traZODone (DESYREL) 50 MG tablet Take 50 mg by mouth at bedtime. 05/05/20  Yes [provider]  apixaban (ELIQUIS) 5 MG TABS tablet Take 5 mg by mouth 2 (two) times daily. Patient not taking: Reported on 02/09/2022    [provider]  dapagliflozin propanediol (FARXIGA) 5 MG TABS tablet Every other day for 14 days,  then daily Patient not taking: Reported on 02/09/2022 11/24/21   Crecencio Mc, MD     Vital Signs: BP 126/76   Pulse 65   Temp 98.3 F (36.8 C) (Axillary)   Resp 19   SpO2 100%   Physical Exam Constitutional:      Appearance: She is ill-appearing.     Interventions: She is sedated and intubated.  Cardiovascular:     Pulses: Normal pulses.  Pulmonary:     Effort: She is intubated.  Skin:    General: Skin is dry.     Comments: Access site is soft, non-bleeding, and without hematoma or evidence for pseudoaneurysm     Imaging: DG Abd Portable 1V  Result Date: 02/09/2022 CLINICAL DATA:  Orogastric tube placement EXAM: PORTABLE ABDOMEN - 1 VIEW COMPARISON:  None Available. FINDINGS: Orogastric tube tip overlies the expected mid to distal body of the stomach. Normal abdominal gas pattern. Cholecystectomy clips seen in the right upper quadrant. Contrast is seen within the renal  collecting system bilaterally. No hydronephrosis. IMPRESSION: Orogastric tube tip within the stomach. Electronically Signed   By: Fidela Salisbury M.D.   On: 02/09/2022 01:15   Portable Chest x-ray  Result Date: 02/08/2022 CLINICAL DATA:  ET tube evaluation EXAM: PORTABLE CHEST 1 VIEW COMPARISON:  02/05/2022 FINDINGS: Endotracheal tube tip is 11 mm above the carina. Cardiomegaly. No confluent airspace opacities or effusions. Mild vascular congestion. Elevation of the right hemidiaphragm,  stable. IMPRESSION: Endotracheal tube 11 mm above the carina. Cardiomegaly, vascular congestion. Electronically Signed   By: Rolm Baptise M.D.   On: 02/08/2022 23:23   CT ANGIO HEAD NECK W WO CM W PERF (CODE STROKE)  Result Date: 02/08/2022 CLINICAL DATA:  Acute neuro deficit. Right-sided droop. Abnormal speech EXAM: CT ANGIOGRAPHY HEAD AND NECK CT PERFUSION BRAIN TECHNIQUE: Multidetector CT imaging of the head and neck was performed using the standard protocol during bolus administration of intravenous contrast. Multiplanar CT image reconstructions and MIPs were obtained to evaluate the vascular anatomy. Carotid stenosis measurements (when applicable) are obtained utilizing NASCET criteria, using the distal internal carotid diameter as the denominator. Multiphase CT imaging of the brain was performed following IV bolus contrast injection. Subsequent parametric perfusion maps were calculated using RAPID software. RADIATION DOSE REDUCTION: This exam was performed according to the departmental dose-optimization program which includes automated exposure control, adjustment of the mA and/or kV according to patient size and/or use of iterative reconstruction technique. CONTRAST:  168m OMNIPAQUE IOHEXOL 350 MG/ML SOLN COMPARISON:  CT head 02/08/2022 FINDINGS: CTA NECK FINDINGS Aortic arch: Mild atherosclerotic calcification aortic arch. Bovine branching pattern. Proximal great vessels widely patent. Right carotid system: Right carotid widely patent. Minimal atherosclerotic disease right carotid bifurcation. Left carotid system: Left carotid widely patent. Negative for stenosis. Vertebral arteries: Both vertebral arteries patent to the skull base without stenosis. Skeleton: No acute skeletal abnormality. Other neck: Negative for mass or adenopathy. Mucosal edema and air-fluid level right maxillary sinus. Mucosal edema right ethmoid sinus. Soft tissue thickening throughout the right nasal cavity which could be due to  polyp or tumor. Direct visualization recommended. Upper chest: Lung apices clear bilaterally. Review of the MIP images confirms the above findings CTA HEAD FINDINGS Anterior circulation: Internal carotid artery widely patent through the skull base and cavernous segment without stenosis. Left M1 widely patent. Occlusion of a left M3 branch involving the inferior division of the left MCA. Superior division left MCA patent Anterior cerebral arteries patent bilaterally. Right middle cerebral artery widely patent without stenosis. Posterior circulation: Both vertebral arteries patent to the basilar. PICA patent bilaterally. Basilar widely patent. Superior cerebellar and posterior cerebral arteries patent bilaterally without stenosis or large vessel occlusion. Venous sinuses: Normal venous enhancement Anatomic variants: None Review of the MIP images confirms the above findings CT Brain Perfusion Findings: ASPECTS: 10 CBF (<30%) Volume: 811m  This is in the left parietal lobe. Perfusion (Tmax>6.0s) volume: 14242mMost of the area of delayed perfusion is in the left posterior temporal and parietal lobe however there is a significant amount also in the right hemisphere involving the right frontal lobe and right parietal lobe. This may be artifact on the right. Mismatch Volume: 134m100mowever approximately 80mL2mthis is in the left hemisphere. Infarction Location:Left parietal lobe IMPRESSION: 1. Occlusion left M3 branch supplying the left parietal lobe. 2. CT perfusion reveals 8 mL of core infarct in the left parietal lobe with surrounding penumbra. Estimated 80 mL of penumbra in the left hemisphere. CT perfusion also shows delayed perfusion in  the right frontal and parietal lobe which could be due to artifact as there is no associated stenosis to this area. 3. No significant carotid or vertebral artery stenosis in the neck 4. These results were called by telephone at the time of interpretation on 02/08/2022 at 7:00 pm to  provider ERIC [facility] , who verbally acknowledged these results. 5. Asymmetric mucosal edema in the right paranasal sinuses involving the right ethmoid and maxillary sinus with air-fluid level right maxillary sinus. There is diffuse soft tissue thickening in the right nasal cavity which could be due to polyp or tumor. Recommend direct visualization. Electronically Signed   By: Franchot Gallo M.D.   On: 02/08/2022 19:09   CT HEAD CODE STROKE WO CONTRAST`  Result Date: 02/08/2022 CLINICAL DATA:  Code stroke. Acute neuro deficit. Right facial droop and arm weakness. EXAM: CT HEAD WITHOUT CONTRAST TECHNIQUE: Contiguous axial images were obtained from the base of the skull through the vertex without intravenous contrast. RADIATION DOSE REDUCTION: This exam was performed according to the departmental dose-optimization program which includes automated exposure control, adjustment of the mA and/or kV according to patient size and/or use of iterative reconstruction technique. COMPARISON:  CT head 05/10/2018 FINDINGS: Brain: Mild atrophy and mild white matter hypodensity. Negative for acute infarct, hemorrhage, mass. Vascular: Negative for hyperdense vessel Skull: Negative Sinuses/Orbits: Mucosal edema in the paranasal sinuses. Air-fluid level right maxillary and right sphenoid sinus. Bilateral cataract extraction Other: None ASPECTS ([place] Stroke Program Early CT Score) - Ganglionic level infarction (caudate, lentiform nuclei, internal capsule, insula, M1-M3 cortex): 7 - Supraganglionic infarction (M4-M6 cortex): 3 Total score (0-10 with 10 being normal): 10 IMPRESSION: 1. Atrophy and chronic microvascular ischemic change in the white matter. No acute abnormality. 2. ASPECTS is 10 3. Code stroke imaging results were communicated on 02/08/2022 at 6:36 pm to provider Lind[place] via amion app Electronically Signed   By: Franchot Gallo M.D.   On: 02/08/2022 18:38    Labs:  CBC: Recent Labs    02/05/22 0336  02/06/22 0407 02/08/22 1851 02/09/22 0028 02/09/22 0600  WBC 6.9 6.4 6.5  --  4.3  HGB 14.9 15.5* 16.6* 15.3* 14.6  HCT 45.2 47.5* 51.4* 45.0 43.6  PLT 152 163 186  --  146*    COAGS: Recent Labs    02/05/22 0003 02/08/22 1919  INR 1.6* 1.3*  APTT  --  28    BMP: Recent Labs    02/05/22 0003 02/06/22 0407 02/08/22 1919 02/09/22 0028 02/09/22 0644  NA 138 140 138 138 137  K 3.7 3.6 4.0 4.1 3.7  CL 104 103 102  --  106  CO2 '26 31 26  '$ --  22  GLUCOSE 164* 137* 124*  --  204*  BUN 22 18 24*  --  13  CALCIUM 9.2 9.0 9.3  --  8.4*  CREATININE 0.66 0.71 0.72  --  0.57  GFRNONAA >60 >60 >60  --  >60    LIVER FUNCTION TESTS: Recent Labs    07/06/21 1526 10/19/21 1007 02/08/22 1919  BILITOT 1.4* 1.7* 1.7*  AST '22 24 27  '$ ALT '20 24 23  '$ ALKPHOS 84 91 76  PROT 6.4 6.9 6.7  ALBUMIN 4.0 4.3 3.9    Assessment and Plan:  CVA; Left M3 occlusion s/p TNK and mechanical thrombectomy with Dr. Estanislado Pandy --1 day s/p thrombectomy --extubation attempt and MR per Stroke team  NIR to continue to follow  Electronically Signed: Pasty Spillers, PA 02/09/2022, 11:50 AM  I spent a total of 15 Minutes at the the patient's bedside AND on the patient's hospital floor or unit, greater than 50% of which was counseling/coordinating care for Code Stroke

## 2022-02-09 NOTE — Progress Notes (Signed)
eLink Physician-Brief Progress Note Patient Name: HAZELL SIWIK DOB: [birth year] MRN: [id number]   Date of Service  02/09/2022  HPI/Events of Note  Nursing request for abdominal film order to confirm OGT placement.   eICU Interventions  Plan: Abdominal film STAT.      Intervention Category Major Interventions: Other:  Lysle Dingwall 02/09/2022, 12:44 AM

## 2022-02-09 NOTE — Progress Notes (Addendum)
eLink Physician-Brief Progress Note Patient Name: [name] DOB: 07-19-1933 MRN: [id number]   Date of Service  02/09/2022  HPI/Events of Note  Nursing reports that when the patient is adequately sedated with Propofol, her SBP < 120 and when lighter on sedation with Propofol, her SBP > 140. Difficult situation. A Fentanyl IV infusion is likely the best option from a BP point of view, however, Fentanyl IV infusion is not the best choice for ventilator weaning. Will try Precedex to permit ventilator weaning; however, may see the same issue with hypotension and/or bradycardia.   eICU Interventions  Plan: Trial of Precedex IV infusion. Titrate to RASS = 0 to -1. Wean Propofol IV infusion off as tolerated.      Intervention Category Major Interventions: Delirium, psychosis, severe agitation - evaluation and management;Hypotension - evaluation and management  [name] Cornelia Copa 02/09/2022, 1:55 AM

## 2022-02-09 NOTE — Progress Notes (Signed)
OT Cancellation Note  Patient Details Name: Linda [name] MRN: [id number] DOB: 1933/11/18   Cancelled Treatment:    Reason Eval/Treat Not Completed: Active bedrest order Will assess when activity orders updated.   [name],HILLARY 02/09/2022, 7:38 AM Maurie Boettcher, OT/L   Acute OT Clinical Specialist Acute Rehabilitation Services Pager [phone] Office [phone]

## 2022-02-09 NOTE — Progress Notes (Addendum)
STROKE TEAM PROGRESS NOTE   INTERVAL HISTORY Patient is seen in her room with her son at the bedside.  Yesterday, she experienced acute onset right facial droop, right arm weakness, aphasia and right visual field cut.  She was given TNK and brought here for mechanical thrombectomy.  TICI 3 flow was achieved in the left M3 MCA.  Patient has remained intubated after the procedure.  Will attempt extubation after MRI today.  Vitals:   02/09/22 1123 02/09/22 1135 02/09/22 1200 02/09/22 1300  BP:   (!) 156/70 99/62  Pulse: 65  83 80  Resp: 19  (!) 23 (!) 29  Temp:  98.3 F (36.8 C)    TempSrc:  Axillary    SpO2: 100%  100% 100%   CBC:  Recent Labs  Lab 02/08/22 1851 02/09/22 0028 02/09/22 0600  WBC 6.5  --  4.3  NEUTROABS 4.1  --  3.7  HGB 16.6* 15.3* 14.6  HCT 51.4* 45.0 43.6  MCV 98.5  --  97.5  PLT 186  --  517*   Basic Metabolic Panel:  Recent Labs  Lab 02/08/22 1919 02/09/22 0028 02/09/22 0644  NA 138 138 137  K 4.0 4.1 3.7  CL 102  --  106  CO2 26  --  22  GLUCOSE 124*  --  204*  BUN 24*  --  13  CREATININE 0.72  --  0.57  CALCIUM 9.3  --  8.4*   Lipid Panel:  Recent Labs  Lab 02/09/22 0644  CHOL 130  TRIG 25  HDL 48  CHOLHDL 2.7  VLDL 5  LDLCALC 77   HgbA1c:  Recent Labs  Lab 02/09/22 0600  HGBA1C 6.6*   Urine Drug Screen:  Recent Labs  Lab 02/08/22 1945  LABOPIA NONE DETECTED  COCAINSCRNUR NONE DETECTED  LABBENZ NONE DETECTED  AMPHETMU NONE DETECTED  THCU NONE DETECTED  LABBARB NONE DETECTED    Alcohol Level  Recent Labs  Lab 02/08/22 1851  ETH <10    IMAGING past 24 hours DG Abd Portable 1V  Result Date: 02/09/2022 CLINICAL DATA:  Orogastric tube placement EXAM: PORTABLE ABDOMEN - 1 VIEW COMPARISON:  None Available. FINDINGS: Orogastric tube tip overlies the expected mid to distal body of the stomach. Normal abdominal gas pattern. Cholecystectomy clips seen in the right upper quadrant. Contrast is seen within the renal collecting  system bilaterally. No hydronephrosis. IMPRESSION: Orogastric tube tip within the stomach. Electronically Signed   By: Fidela Salisbury M.D.   On: 02/09/2022 01:15   Portable Chest x-ray  Result Date: 02/08/2022 CLINICAL DATA:  ET tube evaluation EXAM: PORTABLE CHEST 1 VIEW COMPARISON:  02/05/2022 FINDINGS: Endotracheal tube tip is 11 mm above the carina. Cardiomegaly. No confluent airspace opacities or effusions. Mild vascular congestion. Elevation of the right hemidiaphragm, stable. IMPRESSION: Endotracheal tube 11 mm above the carina. Cardiomegaly, vascular congestion. Electronically Signed   By: Rolm Baptise M.D.   On: 02/08/2022 23:23   CT ANGIO HEAD NECK W WO CM W PERF (CODE STROKE)  Result Date: 02/08/2022 CLINICAL DATA:  Acute neuro deficit. Right-sided droop. Abnormal speech EXAM: CT ANGIOGRAPHY HEAD AND NECK CT PERFUSION BRAIN TECHNIQUE: Multidetector CT imaging of the head and neck was performed using the standard protocol during bolus administration of intravenous contrast. Multiplanar CT image reconstructions and MIPs were obtained to evaluate the vascular anatomy. Carotid stenosis measurements (when applicable) are obtained utilizing NASCET criteria, using the distal internal carotid diameter as the denominator. Multiphase CT imaging of the  brain was performed following IV bolus contrast injection. Subsequent parametric perfusion maps were calculated using RAPID software. RADIATION DOSE REDUCTION: This exam was performed according to the departmental dose-optimization program which includes automated exposure control, adjustment of the mA and/or kV according to patient size and/or use of iterative reconstruction technique. CONTRAST:  142m OMNIPAQUE IOHEXOL 350 MG/ML SOLN COMPARISON:  CT head 02/08/2022 FINDINGS: CTA NECK FINDINGS Aortic arch: Mild atherosclerotic calcification aortic arch. Bovine branching pattern. Proximal great vessels widely patent. Right carotid system: Right carotid widely  patent. Minimal atherosclerotic disease right carotid bifurcation. Left carotid system: Left carotid widely patent. Negative for stenosis. Vertebral arteries: Both vertebral arteries patent to the skull base without stenosis. Skeleton: No acute skeletal abnormality. Other neck: Negative for mass or adenopathy. Mucosal edema and air-fluid level right maxillary sinus. Mucosal edema right ethmoid sinus. Soft tissue thickening throughout the right nasal cavity which could be due to polyp or tumor. Direct visualization recommended. Upper chest: Lung apices clear bilaterally. Review of the MIP images confirms the above findings CTA HEAD FINDINGS Anterior circulation: Internal carotid artery widely patent through the skull base and cavernous segment without stenosis. Left M1 widely patent. Occlusion of a left M3 branch involving the inferior division of the left MCA. Superior division left MCA patent Anterior cerebral arteries patent bilaterally. Right middle cerebral artery widely patent without stenosis. Posterior circulation: Both vertebral arteries patent to the basilar. PICA patent bilaterally. Basilar widely patent. Superior cerebellar and posterior cerebral arteries patent bilaterally without stenosis or large vessel occlusion. Venous sinuses: Normal venous enhancement Anatomic variants: None Review of the MIP images confirms the above findings CT Brain Perfusion Findings: ASPECTS: 10 CBF (<30%) Volume: 830m  This is in the left parietal lobe. Perfusion (Tmax>6.0s) volume: 14236mMost of the area of delayed perfusion is in the left posterior temporal and parietal lobe however there is a significant amount also in the right hemisphere involving the right frontal lobe and right parietal lobe. This may be artifact on the right. Mismatch Volume: 134m50mowever approximately 80mL26mthis is in the left hemisphere. Infarction Location:Left parietal lobe IMPRESSION: 1. Occlusion left M3 branch supplying the left parietal  lobe. 2. CT perfusion reveals 8 mL of core infarct in the left parietal lobe with surrounding penumbra. Estimated 80 mL of penumbra in the left hemisphere. CT perfusion also shows delayed perfusion in the right frontal and parietal lobe which could be due to artifact as there is no associated stenosis to this area. 3. No significant carotid or vertebral artery stenosis in the neck 4. These results were called by telephone at the time of interpretation on 02/08/2022 at 7:00 pm to provider ERIC [facility]ZOcal[facility] verbally acknowledged these results. 5. Asymmetric mucosal edema in the right paranasal sinuses involving the right ethmoid and maxillary sinus with air-fluid level right maxillary sinus. There is diffuse soft tissue thickening in the right nasal cavity which could be due to polyp or tumor. Recommend direct visualization. Electronically Signed   By: CharlFranchot Gallo   On: 02/08/2022 19:09   CT HEAD CODE STROKE WO CONTRAST`  Result Date: 02/08/2022 CLINICAL DATA:  Code stroke. Acute neuro deficit. Right facial droop and arm weakness. EXAM: CT HEAD WITHOUT CONTRAST TECHNIQUE: Contiguous axial images were obtained from the base of the skull through the vertex without intravenous contrast. RADIATION DOSE REDUCTION: This exam was performed according to the departmental dose-optimization program which includes automated exposure control, adjustment of the mA and/or kV according to patient size  and/or use of iterative reconstruction technique. COMPARISON:  CT head 05/10/2018 FINDINGS: Brain: Mild atrophy and mild white matter hypodensity. Negative for acute infarct, hemorrhage, mass. Vascular: Negative for hyperdense vessel Skull: Negative Sinuses/Orbits: Mucosal edema in the paranasal sinuses. Air-fluid level right maxillary and right sphenoid sinus. Bilateral cataract extraction Other: None ASPECTS ([place] Stroke Program Early CT Score) - Ganglionic level infarction (caudate, lentiform nuclei, internal capsule,  insula, M1-M3 cortex): 7 - Supraganglionic infarction (M4-M6 cortex): 3 Total score (0-10 with 10 being normal): 10 IMPRESSION: 1. Atrophy and chronic microvascular ischemic change in the white matter. No acute abnormality. 2. ASPECTS is 10 3. Code stroke imaging results were communicated on 02/08/2022 at 6:36 pm to provider Lindzen via amion app Electronically Signed   By: Franchot Gallo M.D.   On: 02/08/2022 18:38    PHYSICAL EXAM General:  Intubated elderly patient in no acute distress Respiratory:  Respirations synchronous with ventilator Neurological (off sedation):  Patient does not respond to voice or follow commands.  PERRL with positive oculocephalic reflex and cough reflex.  Will withdraw all extremities to noxious stimuli  ASSESSMENT/PLAN Linda [name] is a 88 y.o. female with history of HTN, HLD, mitral regurgitation s/p MitraClip, LVH and atrial fibrillation with anticoagulation held for past four days due to severe epistaxis  presenting with acute onset right facial droop, right arm weakness, aphasia and right visual field cut.  She was given TNK and brought here for mechanical thrombectomy.  TICI 3 flow was achieved in the left M3 MCA.  Patient has remained intubated after the procedure.  Will attempt extubation after MRI today.  Stroke:  left MCA infarcts due to left M3 occlusion s/p TNK and IR with TICI3, etiology likely due to Afib off AC for epistaxis.  Code Stroke CT head No acute abnormality. Small vessel disease. Atrophy. ASPECTS 10.    CTA head & neck left M3 occlusion CT perfusion 8 mL core infarct, 80 mL penumbra MRI  pending 2D Echo EF 50-55%, dilated left ventricle, MitraClip present in mitral valve LDL 77 HgbA1c 6.6 VTE prophylaxis - SCDs No antithrombotic (anticoagulation on hold) prior to admission, now on No antithrombotic as she is <24 hours from TNK. Therapy recommendations:  pending Disposition:  pending  Atrial fibrillation Patient takes Eliquis for  atrial fibrillation at home Eliquis was on hold for four days due to severe epistaxis Will restart Eliquis in next few days if MRI shows no bleeding  Hypertension Home meds:  losartan 25 mg daily Unstable, requiring Cleviprex Keep SBP 120-140 for 24 hours, then <180 if no bleeding on MRI Long-term BP goal normotensive  Hyperlipidemia Home meds:  none LDL 77, goal < 70 No statin due to previous intolerance Add Zetia 10 mg daily Continue zetia at discharge  Respiratory failure Patient remained intubated after thrombectomy Ventilator management per CCM Will attempt to extubate after MRI today if possible  Other Stroke Risk Factors Advanced Age >/= 75  Congestive heart failure  Other Active Problems Recent epistaxis with nasal packing in place Will remove packing 24 hours after TNK  Hospital day # [place] , MSN, AGACNP-BC [facility] See Amion for schedule and pager information 02/09/2022 1:41 PM   ATTENDING NOTE: I reviewed above note and agree with the assessment and plan. Pt was seen and examined.   88 year old female with A-fib on Eliquis but hold for 4 days due to epistaxis, mitral valve regurgitation status post MitraClip, CHF, hypertension, hyperlipidemia admitted  for right-sided weakness, right facial droop, global aphasia and right hemianopia.  CT no acute abnormality status post TNK.  CTA head and neck showed left M3 occlusion, CT 8/88.  Status post IR with TICI3.  MRI and MRI pending.  EF 50 to 55%, LDL 77, A1c 6.6, UDS negative.  Creatinine 0.57  On exam, patient intubated just off sedation, eyes closed, not following commands. With forced eye opening, eyes in mid position, not blinking to visual threat, doll's eyes sluggish, not tracking, PERRL. Corneal reflex present, gag and cough present.  Breathing  over the vent.  Facial symmetry not able to test due to ET tube.  Tongue protrusion not cooperative. On pain stimulation, mild withdrawal  in 4 extremities seems symmetrical. DTR 1+ and no babinski. Sensation, coordination and gait not tested.  Etiology for patient stroke likely due to A-fib with [facility] on hold for epistaxis.  Per son, the epistaxis was severe and was previously cauterized by ENT.  This time she received nasal packing.  May consider resume Eliquis after discussion with ENT in 2 to 3 days post stroke.  Ventilation management per CCM, extubate as able.  BP goal 1 20-1 40 within 24 hours after procedure.  Continue Zetia.  For detailed assessment and plan, please refer to above/below as I have made changes wherever appropriate.   Rosalin Hawking, MD PhD Stroke Neurology 02/09/2022 3:36 PM  This patient is critically ill due to left MCA stroke, status post TNK and IR, A-fib not on AC, respiratory failure and at significant risk of neurological worsening, death form hemorrhagic transformation, bleeding from TNK, recurrent stroke, seizure. This patient's care requires constant monitoring of vital signs, hemodynamics, respiratory and cardiac monitoring, review of multiple databases, neurological assessment, discussion with family, other specialists and medical decision making of high complexity. I spent 40 minutes of neurocritical care time in the care of this patient. I had long discussion with son at bedside, updated pt current condition, treatment plan and potential prognosis, and answered all the questions.  He expressed understanding and appreciation.      To contact Stroke Continuity provider, please refer to [email or web address] After hours, contact General Neurology

## 2022-02-09 NOTE — Progress Notes (Addendum)
STROKE TEAM PROGRESS NOTE   INTERVAL HISTORY Her family is at the bedside.  Remains intubated, precedex resumed for CT head, sedation now off. Hoping to extubate today.  No medication for BP control running at this time. PRNs available- utilize prior to resuming cleviprex.   Vitals:   02/09/22 0645 02/09/22 0652 02/09/22 0700 02/09/22 0732  BP:   108/63   Pulse: (!) 52 (!) 50 (!) 54 (!) 56  Resp: '16 18 18 17  '$ Temp:      TempSrc:      SpO2: 100% 100% 100% 100%   CBC:  Recent Labs  Lab 02/08/22 1851 02/09/22 0028 02/09/22 0600  WBC 6.5  --  4.3  NEUTROABS 4.1  --  3.7  HGB 16.6* 15.3* 14.6  HCT 51.4* 45.0 43.6  MCV 98.5  --  97.5  PLT 186  --  353*   Basic Metabolic Panel:  Recent Labs  Lab 02/08/22 1919 02/09/22 0028 02/09/22 0644  NA 138 138 137  K 4.0 4.1 3.7  CL 102  --  106  CO2 26  --  22  GLUCOSE 124*  --  204*  BUN 24*  --  13  CREATININE 0.72  --  0.57  CALCIUM 9.3  --  8.4*   Lipid Panel:  Recent Labs  Lab 02/09/22 0644  CHOL 130  TRIG 25  HDL 48  CHOLHDL 2.7  VLDL 5  LDLCALC 77   HgbA1c:  Recent Labs  Lab 02/09/22 0600  HGBA1C 6.6*   Urine Drug Screen:  Recent Labs  Lab 02/08/22 1945  LABOPIA NONE DETECTED  COCAINSCRNUR NONE DETECTED  LABBENZ NONE DETECTED  AMPHETMU NONE DETECTED  THCU NONE DETECTED  LABBARB NONE DETECTED    Alcohol Level  Recent Labs  Lab 02/08/22 1851  ETH <10    IMAGING past 24 hours DG Abd Portable 1V  Result Date: 02/09/2022 CLINICAL DATA:  Orogastric tube placement EXAM: PORTABLE ABDOMEN - 1 VIEW COMPARISON:  None Available. FINDINGS: Orogastric tube tip overlies the expected mid to distal body of the stomach. Normal abdominal gas pattern. Cholecystectomy clips seen in the right upper quadrant. Contrast is seen within the renal collecting system bilaterally. No hydronephrosis. IMPRESSION: Orogastric tube tip within the stomach. Electronically Signed   By: Fidela Salisbury M.D.   On: 02/09/2022 01:15    Portable Chest x-ray  Result Date: 02/08/2022 CLINICAL DATA:  ET tube evaluation EXAM: PORTABLE CHEST 1 VIEW COMPARISON:  02/05/2022 FINDINGS: Endotracheal tube tip is 11 mm above the carina. Cardiomegaly. No confluent airspace opacities or effusions. Mild vascular congestion. Elevation of the right hemidiaphragm, stable. IMPRESSION: Endotracheal tube 11 mm above the carina. Cardiomegaly, vascular congestion. Electronically Signed   By: Rolm Baptise M.D.   On: 02/08/2022 23:23   CT ANGIO HEAD NECK W WO CM W PERF (CODE STROKE)  Result Date: 02/08/2022 CLINICAL DATA:  Acute neuro deficit. Right-sided droop. Abnormal speech EXAM: CT ANGIOGRAPHY HEAD AND NECK CT PERFUSION BRAIN TECHNIQUE: Multidetector CT imaging of the head and neck was performed using the standard protocol during bolus administration of intravenous contrast. Multiplanar CT image reconstructions and MIPs were obtained to evaluate the vascular anatomy. Carotid stenosis measurements (when applicable) are obtained utilizing NASCET criteria, using the distal internal carotid diameter as the denominator. Multiphase CT imaging of the brain was performed following IV bolus contrast injection. Subsequent parametric perfusion maps were calculated using RAPID software. RADIATION DOSE REDUCTION: This exam was performed according to the departmental dose-optimization program  which includes automated exposure control, adjustment of the mA and/or kV according to patient size and/or use of iterative reconstruction technique. CONTRAST:  124m OMNIPAQUE IOHEXOL 350 MG/ML SOLN COMPARISON:  CT head 02/08/2022 FINDINGS: CTA NECK FINDINGS Aortic arch: Mild atherosclerotic calcification aortic arch. Bovine branching pattern. Proximal great vessels widely patent. Right carotid system: Right carotid widely patent. Minimal atherosclerotic disease right carotid bifurcation. Left carotid system: Left carotid widely patent. Negative for stenosis. Vertebral arteries:  Both vertebral arteries patent to the skull base without stenosis. Skeleton: No acute skeletal abnormality. Other neck: Negative for mass or adenopathy. Mucosal edema and air-fluid level right maxillary sinus. Mucosal edema right ethmoid sinus. Soft tissue thickening throughout the right nasal cavity which could be due to polyp or tumor. Direct visualization recommended. Upper chest: Lung apices clear bilaterally. Review of the MIP images confirms the above findings CTA HEAD FINDINGS Anterior circulation: Internal carotid artery widely patent through the skull base and cavernous segment without stenosis. Left M1 widely patent. Occlusion of a left M3 branch involving the inferior division of the left MCA. Superior division left MCA patent Anterior cerebral arteries patent bilaterally. Right middle cerebral artery widely patent without stenosis. Posterior circulation: Both vertebral arteries patent to the basilar. PICA patent bilaterally. Basilar widely patent. Superior cerebellar and posterior cerebral arteries patent bilaterally without stenosis or large vessel occlusion. Venous sinuses: Normal venous enhancement Anatomic variants: None Review of the MIP images confirms the above findings CT Brain Perfusion Findings: ASPECTS: 10 CBF (<30%) Volume: 819m  This is in the left parietal lobe. Perfusion (Tmax>6.0s) volume: 14289mMost of the area of delayed perfusion is in the left posterior temporal and parietal lobe however there is a significant amount also in the right hemisphere involving the right frontal lobe and right parietal lobe. This may be artifact on the right. Mismatch Volume: 134m92mowever approximately 80mL65mthis is in the left hemisphere. Infarction Location:Left parietal lobe IMPRESSION: 1. Occlusion left M3 branch supplying the left parietal lobe. 2. CT perfusion reveals 8 mL of core infarct in the left parietal lobe with surrounding penumbra. Estimated 80 mL of penumbra in the left hemisphere. CT  perfusion also shows delayed perfusion in the right frontal and parietal lobe which could be due to artifact as there is no associated stenosis to this area. 3. No significant carotid or vertebral artery stenosis in the neck 4. These results were called by telephone at the time of interpretation on 02/08/2022 at 7:00 pm to provider ERIC LINDZHosp De La Concepciono verbally acknowledged these results. 5. Asymmetric mucosal edema in the right paranasal sinuses involving the right ethmoid and maxillary sinus with air-fluid level right maxillary sinus. There is diffuse soft tissue thickening in the right nasal cavity which could be due to polyp or tumor. Recommend direct visualization. Electronically Signed   By: CharlFranchot Gallo   On: 02/08/2022 19:09   CT HEAD CODE STROKE WO CONTRAST`  Result Date: 02/08/2022 CLINICAL DATA:  Code stroke. Acute neuro deficit. Right facial droop and arm weakness. EXAM: CT HEAD WITHOUT CONTRAST TECHNIQUE: Contiguous axial images were obtained from the base of the skull through the vertex without intravenous contrast. RADIATION DOSE REDUCTION: This exam was performed according to the departmental dose-optimization program which includes automated exposure control, adjustment of the mA and/or kV according to patient size and/or use of iterative reconstruction technique. COMPARISON:  CT head 05/10/2018 FINDINGS: Brain: Mild atrophy and mild white matter hypodensity. Negative for acute infarct, hemorrhage, mass. Vascular: Negative for hyperdense  vessel Skull: Negative Sinuses/Orbits: Mucosal edema in the paranasal sinuses. Air-fluid level right maxillary and right sphenoid sinus. Bilateral cataract extraction Other: None ASPECTS ([place] Stroke Program Early CT Score) - Ganglionic level infarction (caudate, lentiform nuclei, internal capsule, insula, M1-M3 cortex): 7 - Supraganglionic infarction (M4-M6 cortex): 3 Total score (0-10 with 10 being normal): 10 IMPRESSION: 1. Atrophy and chronic  microvascular ischemic change in the white matter. No acute abnormality. 2. ASPECTS is 10 3. Code stroke imaging results were communicated on 02/08/2022 at 6:36 pm to provider Lindzen via amion app Electronically Signed   By: Franchot Gallo M.D.   On: 02/08/2022 18:38    PHYSICAL EXAM  Physical Exam  Constitutional: Appears well-developed and well-nourished.   Cardiovascular: Normal rate and regular rhythm.  Respiratory: Effort normal, non-labored breathing  Neuro: Mental Status: Patient is intubated, sedation is off.   Cranial Nerves: II: Pupils are equal, round, and reactive to light.   III,IV, VI: EOMI without ptosis or diploplia.  V: Facial sensation is symmetric to temperature VII: Facial movement is symmetric resting and smiling VIII: No response to verbal stimuli X: Cough and gag intact XI: Head to the right Motor: Tone is normal. Bulk is normal.  No movement against gravity, no purposeful movement noted Sensory: Withdraws to pain in all extremities Plantars: Toes are downgoing bilaterally.  Cerebellar: Unable to assess at this time   ASSESSMENT/PLAN Ms. JENITA RAYFIELD is a 88 y.o. female with history of PAF with Eliquis discontinued 4 days ago due to severe nosebleed, HFrEF, HLD, HTN, parathyroid disease, prediabetes, TIAs, and cataract extractions who presents to the [facility] ED via EMS as a Code Stroke after sudden onset of right facial droop, right arm weakness, global aphasia and right visual field cut.   Stroke:  left MCA M3 occlusion s/p TNK and TICI3 revascularization Etiology:  suspect embolic with Afib not on AC due to recent epistaxis  Code Stroke CT head Atrophy and chronic microvascular ischemic change in the white matter. No acute abnormality. ASPECTS 10.    CTA head & neck  Occlusion left M3 branch supplying the left parietal lobe. No significant carotid or vertebral artery stenosis in the neck. CT perfusion 8 mL of core infarct in the left parietal lobe with  surrounding penumbra. Estimated 80 mL of penumbra in the left hemisphere. CT perfusion also shows delayed perfusion in the right frontal and parietal lobe which could be due to artifact as there is no associated stenosis to this area Cerebral angio Occluded M3 branch of the inferior division of the left middle cerebral artery Post IR CT no evidence of intracranial hemorrhage.  A small hyperattenuation noted in the posterior insular region probably representing contrast stain.  No gross hemorrhage evident. MRI Approximately 8 mm focus of susceptibility artifact and T2 hypointensity in the right precentral gyrus, possibly representing an age-indeterminate hemorrhage, Acute posterior left MCA territory infarct MRA  No LVO, M3 is likely patent REPEAT CT HEAD- Small acute intra-axial hemorrhage in the posterior Right Centrum, Left MCA territory infarcts  2D Echo EF 50-55%, dilated left ventricle, MitraClip present in mitral valve LDL 77 HgbA1c 6.6 VTE prophylaxis - SCDs Eliquis (apixaban) daily prior to admission (which was d/c in setting of nosebleed), now on No antithrombotic.  Therapy recommendations:  pending Disposition:  pending  ICH: right frontal small hematoma, likely hemorrhagic transformation status post TNK MRI showed small right frontal hematoma at 24 hours of TNK CT 8/25 confirmed small right frontal hematoma, stable Hold off  antithrombotics for now BP goal less than 160  Epistaxis Packed by ENT Dr. Virgia Land at [facility] Orders to remove packing today May consider ENT consult if needed May discuss with ENT before resuming DOAC  Atrial fibrillation Patient takes Eliquis for atrial fibrillation at home Eliquis was on hold for four days due to severe epistaxis, large stroke and small hemorrhagic transformation Hold off antithrombotic for now  Hypertension Home meds:  losartan 25, metoprolol 12.5, furosemide 20  Off cleviprex Goal SBP less than 160 due to hemorrhage on MRI Long-term  BP goal normotensive  Respiratory failure Patient remained intubated after thrombectomy Ventilator management per CCM Will attempt to extubate after MRI today if possible   Hypertension Home meds:  losartan 25 mg daily Stable BP goal less than 160 Long-term BP goal normotensive   Hyperlipidemia Home meds:  none LDL 77, goal < 70 No statin due to previous intolerance Add Zetia 10 mg daily Continue zetia at discharge   Diabetes type II Controlled Home meds:  farxiga HgbA1c 6.6, goal < 7.0 CBGs SSI   Dysphagia Post extubation dysphagia Not able to pass a swallow Had a core track On tube feeding  Other Stroke Risk Factors Advanced Age >/= 1  Congestive heart failure   Other Active Problems   Hospital day # 1  Patient seen and examined by NP/APP with MD. MD to update note as needed.   Janine Ores, DNP, FNP-BC [facility] Pager: [phone]  ATTENDING NOTE: I reviewed above note and agree with the assessment and plan. Pt was seen and examined.   Daughter at bedside.  Neuroimaging reviewed with daughter.  MRI yesterday showed left large left MCA infarct and small right frontal hematoma.  CT this morning repeat showed small right frontal hematoma, likely hemorrhagic transformation.  Hold off antithrombotics for now.  Per CCM, patient likely to be extubated today.  Given likely dysphagia, will put on core track for tube feeding.   For detailed assessment and plan, please refer to above/below as I have made changes wherever appropriate.   Rosalin Hawking, MD PhD Stroke Neurology 02/10/2022 6:31 PM  This patient is critically ill due to left MCA stroke, right frontal small hemorrhagic transformation, status post TNK and IR, A-fib not on AC, epistaxis, respiratory failure and at significant risk of neurological worsening, death form recurrent stroke, hemorrhagic transformation, seizure, respiratory failure, aspiration. This patient's care requires constant  monitoring of vital signs, hemodynamics, respiratory and cardiac monitoring, review of multiple databases, neurological assessment, discussion with family, other specialists and medical decision making of high complexity. I spent 35 minutes of neurocritical care time in the care of this patient. I had long discussion with daughter at bedside, updated pt current condition, treatment plan and potential prognosis, and answered all the questions.  She expressed understanding and appreciation.       To contact Stroke Continuity provider, please refer to [email or web address] After hours, contact General Neurology

## 2022-02-09 NOTE — Progress Notes (Signed)
BP <120 when patient is resting but increases to >140 when stimulated E-link aware.

## 2022-02-09 NOTE — Progress Notes (Signed)
Patient's BP continues to be labile as witnessed by Dr. Curly Shores. Prior to Dr.Bhagat's assessment patient's SBP- 103, during assessment SBP >140. No new orders received will continue to monitor.

## 2022-02-10 ENCOUNTER — Inpatient Hospital Stay (HOSPITAL_COMMUNITY): Payer: Medicare Other

## 2022-02-10 DIAGNOSIS — I6389 Other cerebral infarction: Secondary | ICD-10-CM | POA: Diagnosis not present

## 2022-02-10 DIAGNOSIS — I639 Cerebral infarction, unspecified: Secondary | ICD-10-CM

## 2022-02-10 DIAGNOSIS — I63512 Cerebral infarction due to unspecified occlusion or stenosis of left middle cerebral artery: Secondary | ICD-10-CM | POA: Diagnosis not present

## 2022-02-10 DIAGNOSIS — I6602 Occlusion and stenosis of left middle cerebral artery: Secondary | ICD-10-CM | POA: Diagnosis not present

## 2022-02-10 DIAGNOSIS — J9620 Acute and chronic respiratory failure, unspecified whether with hypoxia or hypercapnia: Secondary | ICD-10-CM

## 2022-02-10 DIAGNOSIS — I482 Chronic atrial fibrillation, unspecified: Secondary | ICD-10-CM

## 2022-02-10 LAB — GLUCOSE, CAPILLARY
Glucose-Capillary: 123 mg/dL — ABNORMAL HIGH (ref 70–99)
Glucose-Capillary: 169 mg/dL — ABNORMAL HIGH (ref 70–99)
Glucose-Capillary: 104 mg/dL — ABNORMAL HIGH (ref 70–99)
Glucose-Capillary: 132 mg/dL — ABNORMAL HIGH (ref 70–99)
Glucose-Capillary: 122 mg/dL — ABNORMAL HIGH (ref 70–99)
Glucose-Capillary: 125 mg/dL — ABNORMAL HIGH (ref 70–99)

## 2022-02-10 LAB — BASIC METABOLIC PANEL
Anion gap: 10 (ref 5–15)
Anion gap: 8 (ref 5–15)
BUN: 13 mg/dL (ref 8–23)
BUN: 11 mg/dL (ref 8–23)
CO2: 21 mmol/L — ABNORMAL LOW (ref 22–32)
CO2: 24 mmol/L (ref 22–32)
Calcium: 8.5 mg/dL — ABNORMAL LOW (ref 8.9–10.3)
Calcium: 8.6 mg/dL — ABNORMAL LOW (ref 8.9–10.3)
Chloride: 107 mmol/L (ref 98–111)
Chloride: 106 mmol/L (ref 98–111)
Creatinine, Ser: 0.76 mg/dL (ref 0.44–1.00)
Creatinine, Ser: 0.71 mg/dL (ref 0.44–1.00)
GFR, Estimated: >60 mL/min (ref >60)
GFR, Estimated: >60 mL/min (ref >60)
Glucose, Bld: 146 mg/dL — ABNORMAL HIGH (ref 70–99)
Glucose, Bld: 126 mg/dL — ABNORMAL HIGH (ref 70–99)
Potassium: 3.8 mmol/L (ref 3.5–5.1)
Potassium: 3.7 mmol/L (ref 3.5–5.1)
Sodium: 138 mmol/L (ref 135–145)
Sodium: 138 mmol/L (ref 135–145)

## 2022-02-10 LAB — CULTURE, BLOOD (ROUTINE X 2)
Culture: NO GROWTH
Culture: NO GROWTH

## 2022-02-10 LAB — CBC
HCT: 40.7 % (ref 36.0–46.0)
Hemoglobin: 13.8 g/dL (ref 12.0–15.0)
MCH: 33 pg (ref 26.0–34.0)
MCHC: 33.9 g/dL (ref 30.0–36.0)
MCV: 97.4 fL (ref 80.0–100.0)
Platelets: 139 10*3/uL — ABNORMAL LOW (ref 150–400)
RBC: 4.18 MIL/uL (ref 3.87–5.11)
RDW: 13.6 % (ref 11.5–15.5)
WBC: 9 10*3/uL (ref 4.0–10.5)
nRBC: 0 % (ref 0.0–0.2)

## 2022-02-10 LAB — MAGNESIUM
Magnesium: 1.5 mg/dL — ABNORMAL LOW (ref 1.7–2.4)
Magnesium: 1.9 mg/dL (ref 1.7–2.4)

## 2022-02-10 LAB — PHOSPHORUS: Phosphorus: 3 mg/dL (ref 2.5–4.6)

## 2022-02-10 MED ORDER — ENSURE ENLIVE PO LIQD
237.0000 mL | Freq: Three times a day (TID) | ORAL | Status: DC
  Administered 2022-02-10 – 2022-02-12 (×2): 237 mL via ORAL

## 2022-02-10 MED ORDER — OXYMETAZOLINE HCL 0.05 % NA SOLN
1.0000 | Freq: Two times a day (BID) | NASAL | Status: AC
  Administered 2022-02-10 – 2022-02-13 (×6): 1 via NASAL
  Filled 2022-02-10: qty 30

## 2022-02-10 MED ORDER — LOSARTAN POTASSIUM 50 MG PO TABS
25.0000 mg | ORAL_TABLET | Freq: Every day | ORAL | Status: DC
Start: 2022-02-10 — End: 2022-02-11
  Filled 2022-02-10: qty 1

## 2022-02-10 MED ORDER — OSMOLITE 1.2 CAL PO LIQD: 1000.0000 mL | ORAL | Status: DC

## 2022-02-10 MED ORDER — PROSOURCE TF20 ENFIT COMPATIBL EN LIQD: 60.0000 mL | Freq: Every day | ENTERAL | Status: DC

## 2022-02-10 MED ORDER — ORAL CARE MOUTH RINSE: 15.0000 mL | OROMUCOSAL | Status: DC | PRN

## 2022-02-10 MED ORDER — HYDRALAZINE HCL 20 MG/ML IJ SOLN
10.0000 mg | Freq: Four times a day (QID) | INTRAMUSCULAR | Status: DC | PRN
  Filled 2022-02-10: qty 1

## 2022-02-10 MED ORDER — POTASSIUM CHLORIDE 20 MEQ PO PACK
40.0000 meq | PACK | Freq: Once | ORAL | Status: AC
  Administered 2022-02-11: 40 meq
  Filled 2022-02-10: qty 2

## 2022-02-10 MED ORDER — MAGNESIUM SULFATE 2 GM/50ML IV SOLN
2.0000 g | Freq: Once | INTRAVENOUS | Status: AC
Start: 2022-02-11 — End: 2022-02-11
  Administered 2022-02-11: 2 g via INTRAVENOUS
  Filled 2022-02-10: qty 50

## 2022-02-10 MED ORDER — MAGNESIUM SULFATE 2 GM/50ML IV SOLN
2.0000 g | Freq: Once | INTRAVENOUS | Status: AC
Start: 2022-02-10 — End: 2022-02-10
  Administered 2022-02-10: 2 g via INTRAVENOUS
  Filled 2022-02-10: qty 50

## 2022-02-10 NOTE — Progress Notes (Signed)
PT Cancellation Note  Patient Details Name: LIN HACKMANN MRN: [id number] DOB: [birth year]   Cancelled Treatment:    Reason Eval/Treat Not Completed: Other (comment); patient extubated and now fatigued per SLP.  Will attempt to see another day.   Reginia Naas 02/10/2022, 4:09 PM Magda Kiel, PT Acute Rehabilitation Services Office:[phone] 02/10/2022

## 2022-02-10 NOTE — Progress Notes (Signed)
NAME:  Linda [name], MRN:  [id number], DOB:  04-28-1934, LOS: 2 ADMISSION DATE:  02/08/2022, CONSULTATION DATE:  8/23 REFERRING MD:  Dr. Curly Shores, CHIEF COMPLAINT:  CVA   History of Present Illness:  88 year old female SNF resident with [facility] as below, which is significant for HFrEF (40-45%), severe MR s/p mitraclip, AF on eliquis, and DM. Recently admitted to [facility] for epistaxis, which was treated with packing in the ED. She was evaluated by ENT, who decided to leave packing in place until outpatient follow up. Eliquis was held. The then presented again to [facility] ED 8/23 with complaints of right facial droop, right arm weakness, global aphasia and right visual field cut. She was evaluated by neurology per code stroke protocol. CT head non-acute. She was administered IV TNK. Further imaging demonstrated occlusion of the left M3 with 185m penumbra. [facility]. Procedure was uncomplicated and yeilded a TICI 3 result. Post operatively she was transferred to the ICU on the mechanical ventilator. PCCM asked to consult for medical and vent management.   Pertinent  Medical History   has a past medical history of Anxiety, Arrhythmia, CHF (congestive heart failure) ([place], Colon polyp, Diverticulitis, Dysrhythmia, GERD (gastroesophageal reflux disease), Hard of hearing, Hoarseness, Hyperlipidemia, Hypertension, Kidney stone, Motion sickness, Parathyroid disease ([place], PONV (postoperative nausea and vomiting), Pre-diabetes, PVC (premature ventricular contraction), Skin cancer, Squamous cell carcinoma of skin (07/19/2021), Squamous cell carcinoma of skin (10/13/2014), Squamous cell carcinoma of skin (09/15/2021), Stroke ([place], TIA (transient ischemic attack) (04/2018), UTI (lower urinary tract infection), Varicose veins of both lower extremities, and Wears hearing aid in both ears.  Significant Hospital Events: Including procedures, antibiotic start and stop dates in  addition to other pertinent events   8/20 -8/21 admit for epistaxis. Discharged with packing in place. Eliquis held 8/23 admit for L MCA CVA. TNK given. Mechanical thrombectomy with TICI 3 8/24 Some issues with hemodynamics overnight, stable this am on assessment.  MRI brain with small acute intra-axial hemorrhage estimated volume 124mseen on CT 8/25 8/25 Intermittent agitation when sedation weaned  Interim History / Subjective:  Sleepy on vent this am resulting in apnea and failed SBT   Objective   Blood pressure 111/68, pulse (!) 40, temperature 97.8 F (36.6 C), temperature source Axillary, resp. rate 18, SpO2 99 %.    Vent Mode: PRVC FiO2 (%):  [40 %] 40 % Set Rate:  [18 bmp] 18 bmp Vt Set:  [410 mL] 410 mL PEEP:  [5 cmH20] 5 cmH20 Pressure Support:  [5 cmH20] 5 cmH20 Plateau Pressure:  [16 cmH20-18 cmH20] 16 cmH20   Intake/Output Summary (Last 24 hours) at 02/10/2022 0704 Last data filed at 02/10/2022 0600 Gross per 24 hour  Intake 1527 ml  Output 1160 ml  Net 367 ml    There were no vitals filed for this visit.  Examination: General: Acute on chronically ill appearing elderly female lying in bed on mechanical ventilation, in NAD HEENT: ETT, MM pink/moist, PERRL,  Neuro: Sedated on vent, unable to follow any commands this am  CV: s1s2 regular rate and rhythm, no murmur, rubs, or gallops,  PULM:  Clear to ascultation, no increased work of breathing, tolerating vent  GI: soft, bowel sounds active in all 4 quadrants, non-tender, non-distended Extremities: warm/dry, no edema  Skin: no rashes or lesions  Resolved Hospital Problem list     Assessment & Plan:   CVA; Left M3 occlusion while Eliquis on hold now  -  s/p TNK and mechanical thrombectomy 8/23 Small acute intra-axial hemorrhage  -Estimated volume 86m seen on CT 8/25 P: Primary management per neurology  Maintain neuro protective measures; goal for eurothermia, euglycemia, eunatermia, normoxia, and PCO2 goal of  35-40 Nutrition and bowel regiment  Seizure precautions  Aspirations precautions  Repeat images per neuro   Post procedure intubation  -Remained intubated post thrombectomy  P: Continue ventilator support with lung protective strategies  Daily attempts at SBT, mentation preclude extubation  Wean PEEP and FiO2 for sats greater than 90%. Head of bed elevated 30 degrees. Plateau pressures less than 30 cm H20.  Follow intermittent chest x-ray and ABG.   Ensure adequate pulmonary hygiene  Follow cultures  VAP bundle in place  PAD protocol  Chronic HFrEF -ECHO 8/24 LVEF to 50-55% with severely dilated LV cavity internal cavity and severe  focal thickening of the basal septal segment measuring 1.7cm. P: Continuous telemetry  ASA initiation per neuro  Strict intake and output  Daily weight to assess volume status Closely monitor renal function and electrolytes  Vent support as above  ECHO asa bove   Atrial fibrillation -Generally anticoagulated with Eliquis, but held due to epistaxis as below  P: Continue to hold Eliquis, may need to engage ENT prior to resumption  Telemetry   Epistaxis with current packing in place -Packing remains in place. ENT outpatient follow up pending for removal. Could consider removing while inpatient, reassess once TNK clears.  P: Keflex to continue through 8/23 Eliquis as above    Type 2 diabetes  P: Continue SSI  CBG q4 Continue to hold home Farxiga  CBG goal 140-180  Best Practice (right click and "Reselect all SmartList Selections" daily)   Diet/type: NPO DVT prophylaxis: not indicated GI prophylaxis: PPI Lines: N/A Foley:  N/A and Yes, and it is still needed Code Status:  full code Last date of multidisciplinary goals of care discussion: Pending family discussion this am  Critical care time: 38 minutes  Elfa Wooton D. HKenton Kingfisher NP-C [facility] Personal contact information can be found on Amion  02/10/2022, 7:04  AM

## 2022-02-10 NOTE — Progress Notes (Signed)
OT Cancellation Note  Patient Details Name: Linda [name] MRN: [id number] DOB: 13-Feb-1934   Cancelled Treatment:    Reason Eval/Treat Not Completed: (P) Other (comment) (Plan for extubation. Will follow up later time.)  [facility] 02/10/2022, 2:06 PM Maurie Boettcher, OT/L   Acute OT Clinical Specialist Acute Rehabilitation Services Pager [phone] Office [phone]

## 2022-02-10 NOTE — Procedures (Signed)
Extubation Procedure Note  Patient Details:   Name: SPECIAL RANES DOB: 07-21-1933 MRN: [id number]   Airway Documentation:    Vent end date: 02/10/22 Vent end time: 1212   Evaluation  O2 sats: stable throughout Complications: No apparent complications Patient did tolerate procedure well. Bilateral Breath Sounds: Clear   Yes  Pt extubated to 3L [place]. Pt tolerating well at this time,Cuff leak present, no stridor noted, RN at bedside, RT will monitor.   Carren Rang 02/10/2022, 12:18 PM

## 2022-02-10 NOTE — Progress Notes (Signed)
ASA no ordered, Dr. Curly Shores aware, no new orders received.

## 2022-02-10 NOTE — Progress Notes (Signed)
Initial Nutrition Assessment  DOCUMENTATION CODES:   Not applicable  INTERVENTION:   Initiate tube feeding via Cortrak tube: Osmolite 1.2 at 55 ml/h (1320 ml per day) Prosource TF20 60 ml daily  Provides 1664 kcal, 93 gm protein, 1070 ml free water daily   NUTRITION DIAGNOSIS:   Inadequate oral intake related to inability to eat as evidenced by NPO status.  GOAL:   Patient will meet greater than or equal to 90% of their needs  MONITOR:   TF tolerance  REASON FOR ASSESSMENT:   Consult Enteral/tube feeding initiation and management  ASSESSMENT:   Pt with PMH of HF, severe MR s/p mitraclip, AF on eliquis, and DM admitted with L MCA s/p TNK and mechanical thrombectomy 8/23.   Pt discussed during ICU rounds and with RN.  Spoke with Dr Erlinda Hong, ok to start TF after cortrak placed.  Pt extubated this am, has been globally aphasic.   Medications reviewed and include: colace, SSI, protonix,   Labs reviewed     NUTRITION - FOCUSED PHYSICAL EXAM:  WNL  Diet Order:   Diet Order             Diet NPO time specified  Diet effective now                   EDUCATION NEEDS:   No education needs have been identified at this time  Skin:  Skin Assessment: Reviewed RN Assessment  Last BM:  unknown  Height:   Ht Readings from Last 1 Encounters:  02/08/22 '5\' 3"'$  (1.6 m)    Weight:   Wt Readings from Last 1 Encounters:  02/08/22 78 kg    BMI:  There is no height or weight on file to calculate BMI.  Estimated Nutritional Needs:   Kcal:  1600-1800  Protein:  85-100 grams  Fluid:  >1.6 L/day  Lockie Pares., RD, LDN, CNSC See AMiON for contact information

## 2022-02-10 NOTE — Progress Notes (Signed)
Brief Cortrak Team Note/Nutrition Note:   SLP able to evaluate pt this afternoon and diet advanced to Dysphagia 1, Nectar Thick. Cortrak placed on hold.   Interventions:  D/C TF Add Ensure Enlive po BID, each supplement provides 350 kcal and 20 grams of protein. RN to thicken to nectar thick consistency while on current diet order Magic cup TID with meals, each supplement provides 290 kcal and 9 grams of protein   Kerman Passey MS, RDN, LDN, CNSC Registered Dietitian 3 Clinical Nutrition RD Pager and On-Call Pager Number Located in [place]

## 2022-02-10 NOTE — Progress Notes (Signed)
eLink Physician-Brief Progress Note Patient Name: EUN VERMEER DOB: 06/22/1933 MRN: [id number]   Date of Service  02/10/2022  HPI/Events of Note  Patient having frequent ventricular ectopy, underlying rhythm is atrial fibrillation, K+ 3.7, Mg++ 1.9.  eICU Interventions  Will order electrolyte repletion to bring K+ > 4.0 and Mg++ > 2.0.        Frederik Pear 02/10/2022, 11:42 PM

## 2022-02-10 NOTE — IPAL (Signed)
  Interdisciplinary Goals of Care Family Meeting   Date carried out:: 02/10/2022  Location of the meeting: Bedside  Member's involved: Physician, Nurse Practitioner, Bedside Registered Nurse, and Family Member or next of kin  Durable Power of Attorney or acting medical decision maker: Daughter Linda [name]  Discussion: We discussed goals of care for Linda [name] . Dr. Shearon Stalls and I met with Daughter at bedside to discuss plan for extubation. Patient is more alert this afternoon and has been tolerating an SBT for approximately 1 hr and is meeting most the criteria for extubation.Given the fact she is not following commands well there is a potential she may no protect her airway well once extubated. This was discussed with family and decision was made to proceed with extubation and it patient were to fail the family would be willing to reintubate for a limited time trial.   Daughter confirms that patient is a DNR and she would not want aggressive interventions such as prolonged ventilator support or tracheostomy.   Code status: Full DNR  Disposition: Continue current acute care   Time spent for the meeting: 72mins  Linda [name] D. Kenton Kingfisher, NP-C [facility] Personal contact information can be found on Amion  02/10/2022, 12:07 PM

## 2022-02-10 NOTE — Progress Notes (Signed)
Nursing notified me that no aspirin was ordered yet.  On review of MRI brain there is concern for new hemorrhage.  Head CT was ordered by day team for follow-up of this.  Head CT does confirm a small hemorrhage on my read, full radiology read pending  Defer timing of aspirin initiation to stroke team given these findings  No charge note

## 2022-02-10 NOTE — Progress Notes (Signed)
Referring Physician(s): * No referring provider recorded for this case *  Supervising Physician: Luanne Bras  Patient Status:  [facility] - In-pt  Chief Complaint: Code Stroke Left M3 occlusion  Subjective: Patient s/p mechanical thrombectomy 02/08/22.  Remains intubated this AM due to slow clearing of sedation medication.  Moving all extremities spontaneously, however not following commands off sedation.  CCM hopeful for extubation this afternoon. Assessed alongside Dr. Estanislado Pandy.  No family at bedside.   Allergies: Barium iodide, Lipitor [atorvastatin], Statins, Crestor [rosuvastatin calcium], Penicillins, Sulfa antibiotics, and Sulfonamide derivatives  Medications: Prior to Admission medications   Medication Sig Start Date End Date Taking? Authorizing Provider  acetaminophen (TYLENOL) 500 MG tablet Take 500 mg by mouth every 6 (six) hours as needed for moderate pain or headache.   Yes [provider]  cephALEXin (KEFLEX) 500 MG capsule Take 1 capsule (500 mg total) by mouth every 8 (eight) hours for 5 days. Patient taking differently: Take 500 mg by mouth every 8 (eight) hours. 5 day course. 02/06/22 02/11/22 Yes Wieting, Richard, MD  cetirizine (ZYRTEC) 10 MG tablet Take 10 mg by mouth daily as needed for allergies.   Yes [provider]  furosemide (LASIX) 20 MG tablet Take 20 mg by mouth daily. 01/07/20  Yes [provider]  losartan (COZAAR) 25 MG tablet Take 1 tablet (25 mg total) by mouth daily. 07/21/21  Yes Crecencio Mc, MD  metoprolol succinate (TOPROL-XL) 25 MG 24 hr tablet Take 12.5 mg by mouth daily. 07/14/21  Yes [provider]  mometasone (ELOCON) 0.1 % cream Apply twice daily as needed to itchy spots at body. Avoid applying to face, groin, and axilla. Use as directed. Long-term use can cause thinning of the skin. Patient taking differently: Apply 1 Application topically 2 (two) times daily as needed (itchy spots). 12/12/21  Yes  Brendolyn Patty, MD  oxymetazoline (AFRIN) 0.05 % nasal spray Place 2 sprays into both nostrils 2 (two) times daily as needed (epistaxis only). 02/06/22  Yes Wieting, Richard, MD  potassium chloride SA (KLOR-CON M) 20 MEQ tablet Take 1 tablet (20 mEq total) by mouth daily. 10/22/21  Yes Crecencio Mc, MD  saline (AYR) GEL Place 1 Application into both nostrils every 6 (six) hours. 02/06/22  Yes Wieting, Richard, MD  traZODone (DESYREL) 50 MG tablet Take 50 mg by mouth at bedtime. 05/05/20  Yes [provider]  apixaban (ELIQUIS) 5 MG TABS tablet Take 5 mg by mouth 2 (two) times daily. Patient not taking: Reported on 02/09/2022    [provider]  dapagliflozin propanediol (FARXIGA) 5 MG TABS tablet Every other day for 14 days,  then daily Patient not taking: Reported on 02/09/2022 11/24/21   Crecencio Mc, MD     Vital Signs: BP 126/81   Pulse (!) 56   Temp 98 F (36.7 C) (Axillary)   Resp 18   SpO2 96%   Physical Exam Vitals and nursing note reviewed.   Intubated, sedated (not currently infusing) Spontaneously moving all extremities. Withdrawals foot to pain on the right.  Does not follow commands.  Opens eyes to voice only.   Imaging: DG CHEST PORT 1 VIEW  Result Date: 02/10/2022 CLINICAL DATA:  Intubated EXAM: PORTABLE CHEST 1 VIEW COMPARISON:  Chest radiograph 02/08/2022 FINDINGS: The endotracheal tube tip is approximately 4.4 cm from the carina. The enteric catheter tip is off the field of view but the side Mcinerney projects in the stomach. The cardiomediastinal silhouette is stable with  unchanged cardiomegaly. There is probable left basilar subsegmental atelectasis. There is no new or worsening focal airspace disease. There is unchanged asymmetric elevation of the right hemidiaphragm. There is no significant pleural effusion. There is no pneumothorax. IMPRESSION: 1. Endotracheal tube tip in the midthoracic trachea. New enteric catheter with the side Macphail in the stomach.  2. Unchanged aeration of the lungs since 02/08/2022 with no new or worsening focal airspace disease. Electronically Signed   By: Valetta Mole M.D.   On: 02/10/2022 08:00   IR PERCUTANEOUS ART THROMBECTOMY/INFUSION INTRACRANIAL INC DIAG ANGIO  Result Date: 02/10/2022 INDICATION: Acute onset of right sided weakness, global aphasia, and left-sided visual field deficit. Occluded M3 branch of the inferior division of the left middle cerebral artery on CT angiogram of the head and neck. EXAM: 1. EMERGENT LARGE VESSEL OCCLUSION THROMBOLYSIS (anterior CIRCULATION) COMPARISON:  CT of the head and neck of February 08, 2022. MEDICATIONS: Ancef antibiotic was administered within 1 hour of the procedure. ANESTHESIA/SEDATION: General anesthesia. CONTRAST:  Omnipaque 300 approximately 120 mL. FLUOROSCOPY TIME:  Fluoroscopy Time: 64 minutes 45 seconds (1057 mGy). COMPLICATIONS: None immediate. TECHNIQUE: Following a full explanation of the procedure along with the potential associated complications, an informed witnessed consent was obtained. The risks of intracranial hemorrhage of 10%, worsening neurological deficit, ventilator dependency, death and inability to revascularize were all reviewed in detail with the patient's daughter. The patient was then put under general anesthesia by the Department of Anesthesiology at [facility]. The right groin was prepped and draped in the usual sterile fashion. Thereafter using modified Seldinger technique, transfemoral access into the right common femoral artery was obtained without difficulty. Over a 0.035 inch guidewire an 8 French 25 cm Pinnacle sheath was inserted. Through this, and also over a 0.035 inch guidewire a combination a 125 cm 6 French Simmons 2 inside of a 100 cm 088 aspiration catheter combination was advanced to the aortic arch region and selectively positioned in the left common carotid artery. Multiple attempts were made to advanced these two combinations over  an 035 inch glidewire and distally in the left internal carotid artery with persistent herniation in this region. This strategy was abandoned. A 5 French Simmons 2 diagnostic catheter was then advanced into the distal left external carotid artery over an 035 inch glidewire. This in turn was exchanged over an 035 inch 300 cm Rosen exchange guidewire for a 95 cm balloon catheter which was positioned at the origin of the left internal carotid artery. The guidewire was removed. Using biplane roadmap with constant fluoroscopic guidance, in a coaxial manner the 071 132 Zoom aspiration was advanced to the supraclinoid left ICA with the 035 Callus guidewire being advanced to the inferior division M2 segment the left middle cerebral artery. The Zoom aspiration catheter was positioned just inside the origin of the left middle cerebral artery. The Callus wire was removed. Over a 0.014 inch Aristotle soft tip micro guidewire with a J-tip configuration, an 021 60 microcatheter was advanced into the inferior division of the left middle cerebral artery M2 segment and gently advanced through the occluded M3 segment followed by the microcatheter. The guidewire was removed. Good aspiration was obtained from the hub of the catheter. A gentle control arteriogram was then performed through the microcatheter demonstrated antegrade flow. This was then connected to continuous heparinized saline infusion. A 3 mm x 20 mm Solitaire X retrieval device was then advanced to the distal end of microcatheter. The O ring on the delivery microcatheter was  loosened. With slight forward gentle traction with the right hand on delivery micro guidewire with the left hand the microcatheter was retrieved unsheathing the retrieval device. Aspiration catheter was advanced into the distal M1 segment. With proximal flow arrest in the left internal carotid artery aspiration pump at the hub of the Zoom aspiration catheter, and with a 20 mL syringe of the guide  catheter, retrieval of combination of reversal of flow arrest, a control arteriogram performed through the balloon guide catheter demonstrated complete revascularization of the M3 segment of the inferior division left MCA. The left anterior cerebral artery territory remained. The left MCA territory demonstrated a TICI 3 revascularization. Balloon guide catheter was retrieved and removed. An 8 French Angio-Seal closure device was obtained in the right groin puncture site. Distal pulses remained palpable in both feet unchanged. A CT of the brain demonstrated no gross intracranial hemorrhage. Small area of hyperattenuation in the posterior left insular area was suggested of contrast stain versus petechial hemorrhage. Patient was left intubated because of her medical condition. She was then transferred to neuro ICU for post revascularization care. FINDINGS: A control arteriogram performed demonstrated the left external carotid artery and its major branches to be widely patent. The left internal carotid artery at the bulb to the cranial skull base demonstrated wide patency with significant tortuosity in the proximal 1/3. PROCEDURE: As above. IMPRESSION: Status post endovascular revascularization of the M3 branch of the inferior division of the left middle cerebral artery with a 3 mm x 20 mm Solitaire X retrieval device with aspiration achieving a TICI 3 revascularization. Puncture to first pass prolonged due to challenging aortic arch anatomy and prox Lt ICA tortuosity. PLAN: Follow-up as per referring MD. Electronically Signed   By: Luanne Bras M.D.   On: 02/10/2022 07:44   IR CT Head Ltd  Result Date: 02/10/2022 INDICATION: Acute onset of right sided weakness, global aphasia, and left-sided visual field deficit. Occluded M3 branch of the inferior division of the left middle cerebral artery on CT angiogram of the head and neck. EXAM: 1. EMERGENT LARGE VESSEL OCCLUSION THROMBOLYSIS (anterior CIRCULATION)  COMPARISON:  CT of the head and neck of February 08, 2022. MEDICATIONS: Ancef antibiotic was administered within 1 hour of the procedure. ANESTHESIA/SEDATION: General anesthesia. CONTRAST:  Omnipaque 300 approximately 120 mL. FLUOROSCOPY TIME:  Fluoroscopy Time: 64 minutes 45 seconds (1057 mGy). COMPLICATIONS: None immediate. TECHNIQUE: Following a full explanation of the procedure along with the potential associated complications, an informed witnessed consent was obtained. The risks of intracranial hemorrhage of 10%, worsening neurological deficit, ventilator dependency, death and inability to revascularize were all reviewed in detail with the patient's daughter. The patient was then put under general anesthesia by the Department of Anesthesiology at [facility]. The right groin was prepped and draped in the usual sterile fashion. Thereafter using modified Seldinger technique, transfemoral access into the right common femoral artery was obtained without difficulty. Over a 0.035 inch guidewire an 8 French 25 cm Pinnacle sheath was inserted. Through this, and also over a 0.035 inch guidewire a combination a 125 cm 6 French Simmons 2 inside of a 100 cm 088 aspiration catheter combination was advanced to the aortic arch region and selectively positioned in the left common carotid artery. Multiple attempts were made to advanced these two combinations over an 035 inch glidewire and distally in the left internal carotid artery with persistent herniation in this region. This strategy was abandoned. A 5 French Simmons 2 diagnostic catheter was then advanced  into the distal left external carotid artery over an 035 inch glidewire. This in turn was exchanged over an 035 inch 300 cm Rosen exchange guidewire for a 95 cm balloon catheter which was positioned at the origin of the left internal carotid artery. The guidewire was removed. Using biplane roadmap with constant fluoroscopic guidance, in a coaxial manner the 071  132 Zoom aspiration was advanced to the supraclinoid left ICA with the 035 Callus guidewire being advanced to the inferior division M2 segment the left middle cerebral artery. The Zoom aspiration catheter was positioned just inside the origin of the left middle cerebral artery. The Callus wire was removed. Over a 0.014 inch Aristotle soft tip micro guidewire with a J-tip configuration, an 021 60 microcatheter was advanced into the inferior division of the left middle cerebral artery M2 segment and gently advanced through the occluded M3 segment followed by the microcatheter. The guidewire was removed. Good aspiration was obtained from the hub of the catheter. A gentle control arteriogram was then performed through the microcatheter demonstrated antegrade flow. This was then connected to continuous heparinized saline infusion. A 3 mm x 20 mm Solitaire X retrieval device was then advanced to the distal end of microcatheter. The O ring on the delivery microcatheter was loosened. With slight forward gentle traction with the right hand on delivery micro guidewire with the left hand the microcatheter was retrieved unsheathing the retrieval device. Aspiration catheter was advanced into the distal M1 segment. With proximal flow arrest in the left internal carotid artery aspiration pump at the hub of the Zoom aspiration catheter, and with a 20 mL syringe of the guide catheter, retrieval of combination of reversal of flow arrest, a control arteriogram performed through the balloon guide catheter demonstrated complete revascularization of the M3 segment of the inferior division left MCA. The left anterior cerebral artery territory remained. The left MCA territory demonstrated a TICI 3 revascularization. Balloon guide catheter was retrieved and removed. An 8 French Angio-Seal closure device was obtained in the right groin puncture site. Distal pulses remained palpable in both feet unchanged. A CT of the brain demonstrated no  gross intracranial hemorrhage. Small area of hyperattenuation in the posterior left insular area was suggested of contrast stain versus petechial hemorrhage. Patient was left intubated because of her medical condition. She was then transferred to neuro ICU for post revascularization care. FINDINGS: A control arteriogram performed demonstrated the left external carotid artery and its major branches to be widely patent. The left internal carotid artery at the bulb to the cranial skull base demonstrated wide patency with significant tortuosity in the proximal 1/3. PROCEDURE: As above. IMPRESSION: Status post endovascular revascularization of the M3 branch of the inferior division of the left middle cerebral artery with a 3 mm x 20 mm Solitaire X retrieval device with aspiration achieving a TICI 3 revascularization. Puncture to first pass prolonged due to challenging aortic arch anatomy and prox Lt ICA tortuosity. PLAN: Follow-up as per referring MD. Electronically Signed   By: Luanne Bras M.D.   On: 02/10/2022 07:44   CT HEAD WO CONTRAST (5MM)  Result Date: 02/10/2022 CLINICAL DATA:  88 year old female code stroke presentation 02/08/2022 with left MCA branch occlusion treated TNK and with endovascular revascularization. Subsequent encounter. EXAM: CT HEAD WITHOUT CONTRAST TECHNIQUE: Contiguous axial images were obtained from the base of the skull through the vertex without intravenous contrast. RADIATION DOSE REDUCTION: This exam was performed according to the departmental dose-optimization program which includes automated exposure control, adjustment of  the mA and/or kV according to patient size and/or use of iterative reconstruction technique. COMPARISON:  Presentation head CT 02/08/2022. brain MRI yesterday. FINDINGS: Brain: Posterior left MCA territory cytotoxic edema (series 7, image 19) corresponding to abnormal diffusion on the MRI yesterday. Mild petechial hemorrhage there appears not significantly  changed. No malignant hemorrhagic transformation. Scattered small areas of restricted diffusion in the left hemisphere, some visible by CT (mild cortical edema left superior frontal gyrus series 7, image 10). Trace rightward midline shift, with only mild mass effect on the left lateral ventricle. Basilar cisterns remain patent. Small posterior right centrum semi of bowel intra-axial hemorrhage is 8 x 10 mm (series 5, image 39) and was present on MRI yesterday. No significant regional edema or mass effect. Estimated hemorrhage volume up to 1 mL. No other cytotoxic edema identified. Stable gray-white matter differentiation elsewhere. Small chronic cerebellar infarcts. Vascular: Calcified atherosclerosis at the skull base. Skull: No acute osseous abnormality identified. Sinuses/Orbits: Intubated. Fluid in the visible pharynx. Right paranasal sinus opacification has not significantly changed since presentation. Tympanic cavities and mastoids remain clear. Other: No acute orbit or scalp soft tissue finding. IMPRESSION: 1. Small acute intra-axial hemorrhage in the posterior Right Centrum Semiovale is stable since yesterday, estimated volume up to 1 mL. No regional edema or mass effect. 2. Expected CT appearance of Left MCA territory infarcts since MRI yesterday. Mild petechial hemorrhage but no malignant hemorrhagic transformation. 3. Minimal intracranial mass effect, trace rightward midline shift. 4. No new intracranial abnormality. Electronically Signed   By: Genevie Ann M.D.   On: 02/10/2022 05:39   MR BRAIN WO CONTRAST  Result Date: 02/09/2022 CLINICAL DATA:  Stroke, follow up get with MRI brain post-TNK timed 24 hours at APPROX.6 PM on 8/24; Stroke, follow up 24 hour post TNK, obtain at about 6 pm on 8/24 please EXAM: MRI HEAD WITHOUT CONTRAST MRA HEAD WITHOUT CONTRAST TECHNIQUE: Multiplanar, multiecho pulse sequences of the brain and surrounding structures were obtained without intravenous contrast. Angiographic  images of the Circle of Willis were obtained using MRA technique without intravenous contrast. COMPARISON:  CTA 02/08/2022.  MRI May 10, 2018. FINDINGS: MRI HEAD FINDINGS Brain: Confluent acute infarct involving the left parietal and left temporal lobe as well as a small portion of the posterior left insula. Multiple additional small acute infarcts in the posterior left frontal lobe and more superior left parietal lobe. Associated edema with regional mass effect. No midline shift. Additional scattered T2/FLAIR hyperintensities in the white matter, nonspecific but compatible with chronic microvascular ischemic disease. Approximately 8 mm focus of susceptibility artifact and T2 hypointensity in the right precentral gyrus. This finding was not present on the prior 2019 MRI. No hydrocephalus. Remote right cerebellar infarct. Vascular: See below. Skull and upper cervical spine: Normal marrow signal. Sinuses/Orbits: Redemonstrated mucosal edema in the right paranasal sinuses involving the right ethmoid and maxillary sinus with air-fluid level right maxillary sinus. There is diffuse soft tissue thickening in the right nasal cavity. Other: No mastoid effusions. MRA HEAD FINDINGS Anterior circulation: Bilateral intracranial ICAs, MCAs, and ACAs are patent without proximal hemodynamically significant stenosis. The previously occluded left M3 MCA branch is probably now patent. Posterior circulation: Bilateral intradural vertebral arteries, basilar artery and bilateral posterior cerebral arteries are patent without proximal hemodynamically significant stenosis. IMPRESSION: MRI: 1. Approximately 8 mm focus of susceptibility artifact and T2 hypointensity in the right precentral gyrus, possibly representing an age-indeterminate hemorrhage that was not present in 2019. Recommend CT of the head without contrast to  evaluate for hyperdense hemorrhage in this region and exclude interval/acute hemorrhage. 2. Acute posterior left  MCA territory infarct, as detailed above 3. Redemonstrated mucosal edema in the right paranasal sinuses involving the right ethmoid and maxillary sinus with air-fluid level right maxillary sinus. There is diffuse soft tissue thickening in the right nasal cavity which could be due to polyp or tumor. Recommend direct visualization. MRA: No evidence of a proximal large vessel occlusion. The previously occluded left M3 MCA branch is probably now patent, although evaluation is somewhat limited with MRA. These results will be called to the ordering clinician or representative by the Radiologist Assistant, and communication documented in the PACS or [employer]. Electronically Signed   By: Margaretha Sheffield M.D.   On: 02/09/2022 15:43   MR ANGIO HEAD WO CONTRAST  Result Date: 02/09/2022 CLINICAL DATA:  Stroke, follow up get with MRI brain post-TNK timed 24 hours at APPROX.6 PM on 8/24; Stroke, follow up 24 hour post TNK, obtain at about 6 pm on 8/24 please EXAM: MRI HEAD WITHOUT CONTRAST MRA HEAD WITHOUT CONTRAST TECHNIQUE: Multiplanar, multiecho pulse sequences of the brain and surrounding structures were obtained without intravenous contrast. Angiographic images of the Circle of Willis were obtained using MRA technique without intravenous contrast. COMPARISON:  CTA 02/08/2022.  MRI May 10, 2018. FINDINGS: MRI HEAD FINDINGS Brain: Confluent acute infarct involving the left parietal and left temporal lobe as well as a small portion of the posterior left insula. Multiple additional small acute infarcts in the posterior left frontal lobe and more superior left parietal lobe. Associated edema with regional mass effect. No midline shift. Additional scattered T2/FLAIR hyperintensities in the white matter, nonspecific but compatible with chronic microvascular ischemic disease. Approximately 8 mm focus of susceptibility artifact and T2 hypointensity in the right precentral gyrus. This finding was not present on the  prior 2019 MRI. No hydrocephalus. Remote right cerebellar infarct. Vascular: See below. Skull and upper cervical spine: Normal marrow signal. Sinuses/Orbits: Redemonstrated mucosal edema in the right paranasal sinuses involving the right ethmoid and maxillary sinus with air-fluid level right maxillary sinus. There is diffuse soft tissue thickening in the right nasal cavity. Other: No mastoid effusions. MRA HEAD FINDINGS Anterior circulation: Bilateral intracranial ICAs, MCAs, and ACAs are patent without proximal hemodynamically significant stenosis. The previously occluded left M3 MCA branch is probably now patent. Posterior circulation: Bilateral intradural vertebral arteries, basilar artery and bilateral posterior cerebral arteries are patent without proximal hemodynamically significant stenosis. IMPRESSION: MRI: 1. Approximately 8 mm focus of susceptibility artifact and T2 hypointensity in the right precentral gyrus, possibly representing an age-indeterminate hemorrhage that was not present in 2019. Recommend CT of the head without contrast to evaluate for hyperdense hemorrhage in this region and exclude interval/acute hemorrhage. 2. Acute posterior left MCA territory infarct, as detailed above 3. Redemonstrated mucosal edema in the right paranasal sinuses involving the right ethmoid and maxillary sinus with air-fluid level right maxillary sinus. There is diffuse soft tissue thickening in the right nasal cavity which could be due to polyp or tumor. Recommend direct visualization. MRA: No evidence of a proximal large vessel occlusion. The previously occluded left M3 MCA branch is probably now patent, although evaluation is somewhat limited with MRA. These results will be called to the ordering clinician or representative by the Radiologist Assistant, and communication documented in the PACS or [employer]. Electronically Signed   By: Margaretha Sheffield M.D.   On: 02/09/2022 15:43   ECHOCARDIOGRAM  COMPLETE  Result Date: 02/09/2022  ECHOCARDIOGRAM REPORT   Patient Name:   AALIA GREULICH Date of Exam: 02/09/2022 Medical Rec #:  [id number]     Height:       63.0 in Accession #:    [id number]    Weight:       172.0 lb Date of Birth:  Dec 08, 1933     BSA:          1.813 m Patient Age:    88 years      BP:           108/63 mmHg Patient Gender: F             HR:           70 bpm. Exam Location:  Inpatient Procedure: 2D Echo, Cardiac Doppler and Color Doppler Indications:    Stroke  History:        Patient has prior history of Echocardiogram examinations, most                 recent 09/29/2020. CHF, Stroke and TIA, Arrythmias:PVC; Risk                 Factors:Hypertension and Dyslipidemia.                  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  Sonographer:    Memory Argue Referring Phys: [id number] Franklin XU IMPRESSIONS  1. Left ventricular ejection fraction, by estimation, is 50 to 55%. The left ventricle has low normal function. The left ventricle has no regional wall motion abnormalities. The left ventricular internal cavity size was severely dilated. There is severe  focal thickening of the basal septal segment measuring 1.7cm. The rest of the LV segments demonstrate mild left ventricular hypertrophy. Left ventricular diastolic parameters are indeterminate.  2. Right ventricular systolic function is normal. The right ventricular size is mildly enlarged. PASP is 44mHg + RAP.  3. Left atrial size was severely dilated.  4. Right atrial size was severely dilated.  5. The mitral valve has been repaired/replaced. There is a Mitra-Clip present in the mitral position. The mean gradient is 763mg at HR 87bpm. There is moderate mitral regurgitation.  6. Tricuspid valve regurgitation is moderate.  7. The aortic valve is tricuspid. Aortic valve regurgitation is not visualized. Aortic valve sclerosis is present, with no evidence of aortic valve stenosis. Comparison(s): No prior TTE  in our system. Compared to prior TTE from OSH, the EF appears to be 50-55% (previously 45-50%), the MR appears slightly improved to moderate (previously severe), TR appears better at moderate (previously severe), PASP remains elevated although RAP not obtained on current study as patient became combative. FINDINGS  Left Ventricle: Left ventricular ejection fraction, by estimation, is 50 to 55%. The left ventricle has low normal function. The left ventricle has no regional wall motion abnormalities. The left ventricular internal cavity size was severely dilated. There is severe focal thickening of the basal septal segment measuring 1.7cm. The rest of the LV segments demonstrate mild left ventricular hypertrophy. Left ventricular diastolic parameters are indeterminate. Right Ventricle: The right ventricular size is mildly enlarged. No increase in right ventricular wall thickness. Right ventricular systolic function is normal. Left Atrium: Left atrial size was severely dilated. Right Atrium: Right atrial size was severely dilated. Pericardium: There is no evidence of pericardial effusion. Mitral Valve: 87bpm. The mitral valve has been repaired/replaced. Moderate mitral  valve regurgitation. There is a Mitra-Clip present in the mitral position. MV peak gradient, 14.6 mmHg. The mean mitral valve gradient is 7.0 mmHg. Tricuspid Valve: The tricuspid valve is normal in structure. Tricuspid valve regurgitation is moderate. Aortic Valve: The aortic valve is tricuspid. Aortic valve regurgitation is not visualized. Aortic valve sclerosis is present, with no evidence of aortic valve stenosis. Aortic valve mean gradient measures 4.0 mmHg. Aortic valve peak gradient measures 8.1  mmHg. Aortic valve area, by VTI measures 2.23 cm. Pulmonic Valve: The pulmonic valve was normal in structure. Pulmonic valve regurgitation is trivial. Aorta: The aortic root is normal in size and structure. IAS/Shunts: There is right bowing of the  interatrial septum, suggestive of elevated left atrial pressure. The atrial septum is grossly normal.  LEFT VENTRICLE PLAX 2D LVIDd:         6.40 cm LVIDs:         4.10 cm LV PW:         1.10 cm LV IVS:        1.00 cm LVOT diam:     2.10 cm LV SV:         50 LV SV Index:   27 LVOT Area:     3.46 cm  LV Volumes (MOD) LV vol d, MOD A4C: 137.0 ml LV vol s, MOD A4C: 67.8 ml LV SV MOD A4C:     137.0 ml RIGHT VENTRICLE TAPSE (M-mode): 1.9 cm LEFT ATRIUM         Index LA diam:    5.20 cm 2.87 cm/m  AORTIC VALVE AV Area (Vmax):    2.31 cm AV Area (Vmean):   2.23 cm AV Area (VTI):     2.23 cm AV Vmax:           142.00 cm/s AV Vmean:          93.400 cm/s AV VTI:            0.222 m AV Peak Grad:      8.1 mmHg AV Mean Grad:      4.0 mmHg LVOT Vmax:         94.70 cm/s LVOT Vmean:        60.200 cm/s LVOT VTI:          0.143 m LVOT/AV VTI ratio: 0.64  AORTA Ao Root diam: 3.10 cm MITRAL VALVE                 TRICUSPID VALVE MV Area VTI:  1.26 cm       TR Peak grad:   46.0 mmHg MV Peak grad: 14.6 mmHg      TR Vmax:        339.00 cm/s MV Mean grad: 7.0 mmHg MV Vmax:      1.91 m/s       SHUNTS MV Vmean:     135.0 cm/s     Systemic VTI:  0.14 m MR Peak grad:   121.4 mmHg   Systemic Diam: 2.10 cm MR Mean grad:   74.0 mmHg MR Vmax:        551.00 cm/s MR Vmean:       396.0 cm/s MR PISA:        0.57 cm MR PISA Radius: 0.30 cm Gwyndolyn Kaufman MD Electronically signed by Gwyndolyn Kaufman MD Signature Date/Time: 02/09/2022/1:20:58 PM    Final    DG Abd Portable 1V  Result Date: 02/09/2022 CLINICAL DATA:  Orogastric tube placement EXAM: PORTABLE ABDOMEN - 1 VIEW  COMPARISON:  None Available. FINDINGS: Orogastric tube tip overlies the expected mid to distal body of the stomach. Normal abdominal gas pattern. Cholecystectomy clips seen in the right upper quadrant. Contrast is seen within the renal collecting system bilaterally. No hydronephrosis. IMPRESSION: Orogastric tube tip within the stomach. Electronically Signed   By: Fidela Salisbury M.D.   On: 02/09/2022 01:15   Portable Chest x-ray  Result Date: 02/08/2022 CLINICAL DATA:  ET tube evaluation EXAM: PORTABLE CHEST 1 VIEW COMPARISON:  02/05/2022 FINDINGS: Endotracheal tube tip is 11 mm above the carina. Cardiomegaly. No confluent airspace opacities or effusions. Mild vascular congestion. Elevation of the right hemidiaphragm, stable. IMPRESSION: Endotracheal tube 11 mm above the carina. Cardiomegaly, vascular congestion. Electronically Signed   By: Rolm Baptise M.D.   On: 02/08/2022 23:23   CT ANGIO HEAD NECK W WO CM W PERF (CODE STROKE)  Result Date: 02/08/2022 CLINICAL DATA:  Acute neuro deficit. Right-sided droop. Abnormal speech EXAM: CT ANGIOGRAPHY HEAD AND NECK CT PERFUSION BRAIN TECHNIQUE: Multidetector CT imaging of the head and neck was performed using the standard protocol during bolus administration of intravenous contrast. Multiplanar CT image reconstructions and MIPs were obtained to evaluate the vascular anatomy. Carotid stenosis measurements (when applicable) are obtained utilizing NASCET criteria, using the distal internal carotid diameter as the denominator. Multiphase CT imaging of the brain was performed following IV bolus contrast injection. Subsequent parametric perfusion maps were calculated using RAPID software. RADIATION DOSE REDUCTION: This exam was performed according to the departmental dose-optimization program which includes automated exposure control, adjustment of the mA and/or kV according to patient size and/or use of iterative reconstruction technique. CONTRAST:  128m OMNIPAQUE IOHEXOL 350 MG/ML SOLN COMPARISON:  CT head 02/08/2022 FINDINGS: CTA NECK FINDINGS Aortic arch: Mild atherosclerotic calcification aortic arch. Bovine branching pattern. Proximal great vessels widely patent. Right carotid system: Right carotid widely patent. Minimal atherosclerotic disease right carotid bifurcation. Left carotid system: Left carotid widely patent. Negative  for stenosis. Vertebral arteries: Both vertebral arteries patent to the skull base without stenosis. Skeleton: No acute skeletal abnormality. Other neck: Negative for mass or adenopathy. Mucosal edema and air-fluid level right maxillary sinus. Mucosal edema right ethmoid sinus. Soft tissue thickening throughout the right nasal cavity which could be due to polyp or tumor. Direct visualization recommended. Upper chest: Lung apices clear bilaterally. Review of the MIP images confirms the above findings CTA HEAD FINDINGS Anterior circulation: Internal carotid artery widely patent through the skull base and cavernous segment without stenosis. Left M1 widely patent. Occlusion of a left M3 branch involving the inferior division of the left MCA. Superior division left MCA patent Anterior cerebral arteries patent bilaterally. Right middle cerebral artery widely patent without stenosis. Posterior circulation: Both vertebral arteries patent to the basilar. PICA patent bilaterally. Basilar widely patent. Superior cerebellar and posterior cerebral arteries patent bilaterally without stenosis or large vessel occlusion. Venous sinuses: Normal venous enhancement Anatomic variants: None Review of the MIP images confirms the above findings CT Brain Perfusion Findings: ASPECTS: 10 CBF (<30%) Volume: 852m  This is in the left parietal lobe. Perfusion (Tmax>6.0s) volume: 14213mMost of the area of delayed perfusion is in the left posterior temporal and parietal lobe however there is a significant amount also in the right hemisphere involving the right frontal lobe and right parietal lobe. This may be artifact on the right. Mismatch Volume: 134m63mowever approximately 80mL47mthis is in the left hemisphere. Infarction Location:Left parietal lobe IMPRESSION: 1. Occlusion left M3 branch supplying the  left parietal lobe. 2. CT perfusion reveals 8 mL of core infarct in the left parietal lobe with surrounding penumbra. Estimated 80 mL of  penumbra in the left hemisphere. CT perfusion also shows delayed perfusion in the right frontal and parietal lobe which could be due to artifact as there is no associated stenosis to this area. 3. No significant carotid or vertebral artery stenosis in the neck 4. These results were called by telephone at the time of interpretation on 02/08/2022 at 7:00 pm to provider ERIC [facility] , who verbally acknowledged these results. 5. Asymmetric mucosal edema in the right paranasal sinuses involving the right ethmoid and maxillary sinus with air-fluid level right maxillary sinus. There is diffuse soft tissue thickening in the right nasal cavity which could be due to polyp or tumor. Recommend direct visualization. Electronically Signed   By: Franchot Gallo M.D.   On: 02/08/2022 19:09   CT HEAD CODE STROKE WO CONTRAST`  Result Date: 02/08/2022 CLINICAL DATA:  Code stroke. Acute neuro deficit. Right facial droop and arm weakness. EXAM: CT HEAD WITHOUT CONTRAST TECHNIQUE: Contiguous axial images were obtained from the base of the skull through the vertex without intravenous contrast. RADIATION DOSE REDUCTION: This exam was performed according to the departmental dose-optimization program which includes automated exposure control, adjustment of the mA and/or kV according to patient size and/or use of iterative reconstruction technique. COMPARISON:  CT head 05/10/2018 FINDINGS: Brain: Mild atrophy and mild white matter hypodensity. Negative for acute infarct, hemorrhage, mass. Vascular: Negative for hyperdense vessel Skull: Negative Sinuses/Orbits: Mucosal edema in the paranasal sinuses. Air-fluid level right maxillary and right sphenoid sinus. Bilateral cataract extraction Other: None ASPECTS (Decker Stroke Program Early CT Score) - Ganglionic level infarction (caudate, lentiform nuclei, internal capsule, insula, M1-M3 cortex): 7 - Supraganglionic infarction (M4-M6 cortex): 3 Total score (0-10 with 10 being normal): 10  IMPRESSION: 1. Atrophy and chronic microvascular ischemic change in the white matter. No acute abnormality. 2. ASPECTS is 10 3. Code stroke imaging results were communicated on 02/08/2022 at 6:36 pm to provider Lindzen via amion app Electronically Signed   By: Franchot Gallo M.D.   On: 02/08/2022 18:38    Labs:  CBC: Recent Labs    02/06/22 0407 02/08/22 1851 02/09/22 0028 02/09/22 0600 02/10/22 0210  WBC 6.4 6.5  --  4.3 9.0  HGB 15.5* 16.6* 15.3* 14.6 13.8  HCT 47.5* 51.4* 45.0 43.6 40.7  PLT 163 186  --  146* 139*    COAGS: Recent Labs    02/05/22 0003 02/08/22 1919  INR 1.6* 1.3*  APTT  --  28    BMP: Recent Labs    02/06/22 0407 02/08/22 1919 02/09/22 0028 02/09/22 0644 02/10/22 0210  NA 140 138 138 137 138  K 3.6 4.0 4.1 3.7 3.8  CL 103 102  --  106 107  CO2 31 26  --  22 21*  GLUCOSE 137* 124*  --  204* 146*  BUN 18 24*  --  13 13  CALCIUM 9.0 9.3  --  8.4* 8.5*  CREATININE 0.71 0.72  --  0.57 0.76  GFRNONAA >60 >60  --  >60 >60    LIVER FUNCTION TESTS: Recent Labs    07/06/21 1526 10/19/21 1007 02/08/22 1919  BILITOT 1.4* 1.7* 1.7*  AST '22 24 27  '$ ALT '20 24 23  '$ ALKPHOS 84 91 76  PROT 6.4 6.9 6.7  ALBUMIN 4.0 4.3 3.9    Assessment and Plan: Occluded M3 branch of the  inferior division of the left middle cerebral artery. Treatment.  Complete revascularization of the occluded vessel obtained with 1 pass with a 3 mm x 20 mm solitare X retrieval device, aspiration, and proximal flow arrest achieving aTICI 3 revascularization. Patient remains intubated this AM due to agitation with weaning sedation.  Imaging reviewed by Dr. Estanislado Pandy.  CCM hopeful for extubation later this afternoon.  Patient moving all extremities spontaneously, however not following commands.   Electronically Signed: Docia Barrier, PA 02/10/2022, 2:29 PM   I spent a total of 15 Minutes at the the patient's bedside AND on the patient's hospital floor or unit, greater  than 50% of which was counseling/coordinating care for left M3 occlusion.

## 2022-02-10 NOTE — Evaluation (Signed)
Clinical/Bedside Swallow Evaluation Patient Details  Name: Linda [name] MRN: [id number] Date of Birth: [birth year]  Today's Date: 02/10/2022 Time: SLP Start Time (ACUTE ONLY): 1520 SLP Stop Time (ACUTE ONLY): 1532 SLP Time Calculation (min) (ACUTE ONLY): 12 min  Past Medical History:  Past Medical History:  Diagnosis Date   Anxiety    Arrhythmia    paroxysmal atrial fibrillation   CHF (congestive heart failure) (HCC)    Colon polyp    Diverticulitis    Dysrhythmia    GERD (gastroesophageal reflux disease)    Hard of hearing    Hoarseness    Hyperlipidemia    Hypertension    Kidney stone    Motion sickness    Parathyroid disease ([place])    Parathyroidectomy    PONV (postoperative nausea and vomiting)    Pre-diabetes    PVC (premature ventricular contraction)    Skin cancer    Squamous cell carcinoma of skin 07/19/2021   right lower pretibia lateral, EDC   Squamous cell carcinoma of skin 10/13/2014   R ant neck - KA pattern   Squamous cell carcinoma of skin 09/15/2021   Left dorsal hand, shaved down to fat at time of biopsy so should already be removed.  Will observe for recurrence.   Stroke [facility])    TIA (transient ischemic attack) 04/2018   no deficitis   UTI (lower urinary tract infection)    Varicose veins of both lower extremities    Wears hearing aid in both ears    Past Surgical History:  Past Surgical History:  Procedure Laterality Date   ABDOMINAL HYSTERECTOMY     APPENDECTOMY     CATARACT EXTRACTION W/PHACO Left 03/05/2019   Procedure: CATARACT EXTRACTION PHACO AND INTRAOCULAR LENS PLACEMENT ([place])   0:57 15.6% 9.05;  Surgeon: Leandrew Koyanagi, MD;  Location: [place];  Service: Ophthalmology;  Laterality: Left;  Diabetic - diet cotrolled   CATARACT EXTRACTION W/PHACO Right 04/09/2019   Procedure: CATARACT EXTRACTION PHACO AND INTRAOCULAR LENS PLACEMENT ([facility]) RIGHT DIABETIC 00:47.6  15.9%  7.60;  Surgeon: Leandrew Koyanagi, MD;  Location:  [place];  Service: Ophthalmology;  Laterality: Right;   CHOLECYSTECTOMY  2002   ESOPHAGOGASTRODUODENOSCOPY (EGD) WITH PROPOFOL N/A 12/17/2019   Procedure: ESOPHAGOGASTRODUODENOSCOPY (EGD) WITH PROPOFOL;  Surgeon: Robert Bellow, MD;  Location: [facility];  Service: Endoscopy;  Laterality: N/A;   IR CT HEAD LTD  02/08/2022   IR PERCUTANEOUS ART THROMBECTOMY/INFUSION INTRACRANIAL INC DIAG ANGIO  02/08/2022   LITHOTRIPSY     MITRAL VALVE REPAIR  12/14/2020   PARATHYROIDECTOMY  2004   1 removed   RADIOLOGY WITH ANESTHESIA N/A 02/08/2022   Procedure: IR WITH ANESTHESIA;  Surgeon: Luanne Bras, MD;  Location: [place];  Service: Radiology;  Laterality: N/A;   ROTATOR CUFF REPAIR  2002   TEE WITHOUT CARDIOVERSION N/A 09/29/2020   Procedure: TRANSESOPHAGEAL ECHOCARDIOGRAM (TEE);  Surgeon: Teodoro Spray, MD;  Location: [facility];  Service: Cardiovascular;  Laterality: N/A;   [place] W/ BILATERAL SALPINGOOPHORECTOMY  1984   VEIN LIGATION AND STRIPPING     HPI:  KHADEJA ABT is an 88 y.o. female SNF resident who presented to the [facility] ED via EMS as a Code Stroke after sudden onset of right facial droop, right arm weakness, global aphasia and right visual field cut. s. CT head revealed no acute hypodensity with ASPECTS of 10. TNK was administered and pt was transferred to  [facility] for mechanical thrombectomy on 8/24. Extubated 8/25.  PMHx of PAF with Eliquis discontinued 4 days PTA due to severe nosebleed, CHF, HLD, HTN, parathyroid disease, prediabetes, TIAs, and cataract extractions.    Assessment / Plan / Recommendation  Clinical Impression  Ms. Misner presented with a mild oropharyngeal dysphagia marked by anterior spillage of liquids and mild throat-clearing after consumption of thin liquids. She declined regular solids despite encouragement. Voice was clear; unable to follow commands for cough/swallow.  Recommend starting  dysphagia 1/nectar thick liquids for now. Anticipate rapid diet progression. SLP will follow. SLP Visit Diagnosis: Dysphagia, oropharyngeal phase (R13.12)    Aspiration Risk  Mild aspiration risk    Diet Recommendation   Dysphagia 1, nectar thick liquids  Medication Administration: Whole meds with puree    Other  Recommendations Oral Care Recommendations: Oral care BID Other Recommendations: Order thickener from pharmacy    Recommendations for follow up therapy are one component of a multi-disciplinary discharge planning process, led by the attending physician.  Recommendations may be updated based on patient status, additional functional criteria and insurance authorization.  Follow up Recommendations Other (comment) (tba)      Assistance Recommended at Discharge Frequent or constant Supervision/Assistance  Functional Status Assessment Patient has had a recent decline in their functional status and demonstrates the ability to make significant improvements in function in a reasonable and predictable amount of time.  Frequency and Duration min 2x/week  2 weeks       Prognosis        Swallow Study   General Date of Onset: 02/10/22 HPI: Linda [name] is an 88 y.o. female SNF resident who presented to the [facility] ED via EMS as a Code Stroke after sudden onset of right facial droop, right arm weakness, global aphasia and right visual field cut. s. CT head revealed no acute hypodensity with ASPECTS of 10. TNK was administered and pt was transferred to [facility] for mechanical thrombectomy on 8/24. Extubated 8/25.  PMHx of PAF with Eliquis discontinued 4 days PTA due to severe nosebleed, CHF, HLD, HTN, parathyroid disease, prediabetes, TIAs, and cataract extractions. Type of Study: Bedside Swallow Evaluation Previous Swallow Assessment: no Diet Prior to this Study: NPO Temperature Spikes Noted: No Respiratory Status: Nasal cannula History of Recent Intubation: Yes Length of Intubations  (days): 1 days Date extubated: 02/10/22 Behavior/Cognition: Alert Oral Cavity Assessment: Within Functional Limits Oral Care Completed by SLP: Recent completion by staff Oral Cavity - Dentition: Adequate natural dentition Self-Feeding Abilities: Needs assist Patient Positioning: Upright in bed Baseline Vocal Quality: Normal Volitional Cough: Cognitively unable to elicit Volitional Swallow: Unable to elicit    Oral/Motor/Sensory Function     Ice Chips Ice chips: Within functional limits   Thin Liquid Thin Liquid: Impaired Presentation: Straw;Cup Oral Phase Impairments: Reduced labial seal Oral Phase Functional Implications: Right anterior spillage Pharyngeal  Phase Impairments: Suspected delayed Swallow;Throat Clearing - Immediate    Nectar Thick Nectar Thick Liquid: Within functional limits   Honey Thick Honey Thick Liquid: Not tested   Puree Puree: Within functional limits   Solid     Solid: Not tested     Estill Bamberg L. Tivis Ringer, MA CCC/SLP Clinical Specialist - Acute Care SLP Acute Rehabilitation Services Office number [phone]  Juan Quam Laurice 02/10/2022,5:05 PM

## 2022-02-10 NOTE — Progress Notes (Signed)
PCCM Progress Note   Called and spoke to Dr, Janace Hoard ENT regarding nasal packing. We reviewed patients chart and he recommended removing packing at bedside per clinical team today with instructions to call him if bleeding returns.   Linda [name] D. Kenton Kingfisher, NP-C [facility] Personal contact information can be found on Amion  02/10/2022, 9:40 AM

## 2022-02-10 NOTE — Progress Notes (Signed)
RT arrived at bedside and ET tube was at 20 cm measured from the lips. RT advanced ET tube to original placement of 22cm measured from the lips. Chest Xray ordered to confirm placement,pt tolerated well, RN at bedside, CCM aware, RT will monitor.

## 2022-02-10 NOTE — Progress Notes (Signed)
eLink Physician-Brief Progress Note Patient Name: Linda [name] DOB: [birth year] MRN: [id number]   Date of Service  02/10/2022  HPI/Events of Note  Patient asking for resumption of her home Afrin nasal spray.  eICU Interventions  Afrin nasal spray ordered.        Frederik Pear 02/10/2022, 9:17 PM

## 2022-02-10 NOTE — Progress Notes (Signed)
Pt not tolerating SBT at this time due to apnea and low min ventilation. Pt placed back on full vent support, Pt tolerating well at this time. RN aware, CCM aware, RT will monitor.

## 2022-02-10 NOTE — Progress Notes (Signed)
Transported patient to CT and back to [id number] without event.

## 2022-02-10 NOTE — Evaluation (Signed)
Speech Language Pathology Evaluation Patient Details Name: Linda [name] MRN: [id number] DOB: [birth year] Today's Date: 02/10/2022 Time: 0017-4944 SLP Time Calculation (min) (ACUTE ONLY): 24 min  Problem List:  Patient Active Problem List   Diagnosis Date Noted   Acute ischemic left MCA stroke ([place]) 02/08/2022   Status post stroke 02/08/2022   Middle cerebral artery embolism, left 02/08/2022   Endotracheal tube present    Chronic HFrEF (heart failure with reduced ejection fraction) (HCC)    NSVT (nonsustained ventricular tachycardia) ([place]) 02/06/2022   Hypoxia 02/05/2022   Chronic anticoagulation 02/05/2022   HFrEF (heart failure with reduced ejection fraction) ([place]) 02/05/2022   Pulmonary hypertension ([place]) 02/05/2022   Valvular cardiomyopathy ([place]) 10/22/2021   S/P mitral valve clip implantation 10/05/2021   Varicose veins of both lower extremities 07/13/2021   Injury of right leg 07/06/2021   Right leg pain 07/06/2021   Callus of foot 07/06/2021   Proximal muscle weakness 08/24/2020   Severe mitral regurgitation s/p MitraClip June 2022 07/09/2020   Fatigue 05/29/2020   Diuretic-induced hypokalemia 12/16/2019   Myalgia due to statin 12/15/2019   Acquired thrombophilia ([place]) 12/15/2019   Skin lesion 08/27/2018   Hospital discharge follow-up 05/25/2018   Abnormal chest x-ray 05/25/2018   History of CVA (cerebrovascular accident) without residual deficits 05/10/2018   Parent-child estrangement nec 02/09/2018   Basal cell carcinoma (BCC) of right lower extremity 08/08/2017   Hematuria, gross 08/08/2017   Extremity atherosclerosis with intermittent claudication ([place]) 08/08/2017   Generalized anxiety disorder 01/31/2017   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   Controlled type 2 diabetes mellitus with microalbuminuria, without long-term current use of insulin ([place]) 01/20/2016   Insomnia secondary to anxiety 10/09/2015   Polycythemia, secondary 07/04/2015   Pre-syncope 07/02/2015    Osteopenia 08/21/2014   Gastritis 05/26/2014   Epistaxis 03/18/2014   GERD (gastroesophageal reflux disease) 03/04/2014   Medicare annual wellness visit, subsequent 01/13/2014   Essential hypertension 11/04/2013   Screening for breast cancer 07/01/2013   Left shoulder pain 03/21/2013   Hoarseness of voice 03/21/2013   Edema 09/05/2010   VENTRICULAR HYPERTROPHY, LEFT 08/16/2010   Hyperlipidemia 06/03/2010   ATRIAL FIBRILLATION 08/13/2009   Past Medical History:  Past Medical History:  Diagnosis Date   Anxiety    Arrhythmia    paroxysmal atrial fibrillation   CHF (congestive heart failure) (HCC)    Colon polyp    Diverticulitis    Dysrhythmia    GERD (gastroesophageal reflux disease)    Hard of hearing    Hoarseness    Hyperlipidemia    Hypertension    Kidney stone    Motion sickness    Parathyroid disease ([place])    Parathyroidectomy    PONV (postoperative nausea and vomiting)    Pre-diabetes    PVC (premature ventricular contraction)    Skin cancer    Squamous cell carcinoma of skin 07/19/2021   right lower pretibia lateral, EDC   Squamous cell carcinoma of skin 10/13/2014   R ant neck - KA pattern   Squamous cell carcinoma of skin 09/15/2021   Left dorsal hand, shaved down to fat at time of biopsy so should already be removed.  Will observe for recurrence.   Stroke [facility])    TIA (transient ischemic attack) 04/2018   no deficitis   UTI (lower urinary tract infection)    Varicose veins of both lower extremities    Wears hearing aid in both ears    Past Surgical History:  Past Surgical History:  Procedure  Laterality Date   ABDOMINAL HYSTERECTOMY     APPENDECTOMY     CATARACT EXTRACTION W/PHACO Left 03/05/2019   Procedure: CATARACT EXTRACTION PHACO AND INTRAOCULAR LENS PLACEMENT (IOC)   0:57 15.6% 9.05;  Surgeon: Leandrew Koyanagi, MD;  Location: [place];  Service: Ophthalmology;  Laterality: Left;  Diabetic - diet cotrolled   CATARACT EXTRACTION  W/PHACO Right 04/09/2019   Procedure: CATARACT EXTRACTION PHACO AND INTRAOCULAR LENS PLACEMENT (IOC) RIGHT DIABETIC 00:47.6  15.9%  7.60;  Surgeon: Leandrew Koyanagi, MD;  Location: [place];  Service: Ophthalmology;  Laterality: Right;   CHOLECYSTECTOMY  2002   ESOPHAGOGASTRODUODENOSCOPY (EGD) WITH PROPOFOL N/A 12/17/2019   Procedure: ESOPHAGOGASTRODUODENOSCOPY (EGD) WITH PROPOFOL;  Surgeon: Robert Bellow, MD;  Location: [facility];  Service: Endoscopy;  Laterality: N/A;   IR CT HEAD LTD  02/08/2022   IR PERCUTANEOUS ART THROMBECTOMY/INFUSION INTRACRANIAL INC DIAG ANGIO  02/08/2022   LITHOTRIPSY     MITRAL VALVE REPAIR  12/14/2020   PARATHYROIDECTOMY  2004   1 removed   RADIOLOGY WITH ANESTHESIA N/A 02/08/2022   Procedure: IR WITH ANESTHESIA;  Surgeon: Luanne Bras, MD;  Location: [place];  Service: Radiology;  Laterality: N/A;   ROTATOR CUFF REPAIR  2002   TEE WITHOUT CARDIOVERSION N/A 09/29/2020   Procedure: TRANSESOPHAGEAL ECHOCARDIOGRAM (TEE);  Surgeon: Teodoro Spray, MD;  Location: [facility];  Service: Cardiovascular;  Laterality: N/A;   [place] W/ BILATERAL SALPINGOOPHORECTOMY  1984   VEIN LIGATION AND STRIPPING     HPI:  Linda [name] is an 88 y.o. female SNF resident who presented to the [facility] ED via EMS as a Code Stroke after sudden onset of right facial droop, right arm weakness, global aphasia and right visual field cut. s. CT head revealed no acute hypodensity with ASPECTS of 10. TNK was administered and pt was transferred to [facility] for mechanical thrombectomy on 8/24. Extubated 8/25.  PMHx of PAF with Eliquis discontinued 4 days PTA due to severe nosebleed, CHF, HLD, HTN, parathyroid disease, prediabetes, TIAs, and cataract extractions.   Assessment / Plan / Recommendation Clinical Impression  Linda [name] presents with a non-fluent, expressive aphasia marked by 1-3 word utterances with repetitions  and phonemic paraphasias.  Comprehension is better than expression. She can answer yes/no questions with greater reliability when the topics are related to her needs/wants.  She follows simple commands with 60% accuracy. She is unable to repeat examiner nor produce automatic sequences (e.g., counting, DOW) despite max verbal/visual cues. She makes great efforts to speak and appears quite frustrated.  Provided encouragement.  Pt will benefit from intensive aphasia treatment. Will follow while admitted.    SLP Assessment  SLP Recommendation/Assessment: Patient needs continued Speech [facility] [facility] SLP Visit Diagnosis: Aphasia (R47.01)    Recommendations for follow up therapy are one component of a multi-disciplinary discharge planning process, led by the attending physician.  Recommendations may be updated based on patient status, additional functional criteria and insurance authorization.    Follow Up Recommendations  Skilled nursing-short term rehab (<3 hours/day)    Assistance Recommended at Discharge  Frequent or constant Supervision/Assistance  Functional Status Assessment Patient has had a recent decline in their functional status and demonstrates the ability to make significant improvements in function in a reasonable and predictable amount of time.  Frequency and Duration min 3x week  2 weeks      SLP Evaluation Cognition  Overall Cognitive Status: Difficult  to assess (due to aphasia)       Comprehension  Auditory Comprehension Overall Auditory Comprehension: Impaired Yes/No Questions: Impaired Basic Biographical Questions: 51-75% accurate Commands: Impaired One Step Basic Commands: 50-74% accurate EffectiveTechniques: Extra processing time;Slowed speech Visual Recognition/Discrimination Discrimination: Exceptions to [facility] Common Objects: Able in field of 2 Reading Comprehension Reading Status: Not tested    Expression Expression Primary Mode of Expression:  Verbal Verbal Expression Overall Verbal Expression: Impaired Initiation: No impairment Level of Generative/Spontaneous Verbalization: Phrase Repetition: Impaired Level of Impairment: Word level Naming: Impairment Confrontation: Impaired Common Objects: Able in field of 2   Oral / Motor  Motor Speech Overall Motor Speech: Appears within functional limits for tasks assessed            Juan Quam Laurice 02/10/2022, 5:17 PM Kannen Moxey L. Tivis Ringer, MA CCC/SLP Clinical Specialist - [place] Office number [phone]

## 2022-02-10 NOTE — Telephone Encounter (Signed)
Transition Care Management Unsuccessful Follow-up Telephone Call  Date of discharge and from where:  02/06/22 [facility]  Attempts:  3rd Attempt  Reason for unsuccessful TCM follow-up call:  Left voice message. HFU scheduled 02/13/22 at 11:00. Keep all scheduled appointments.

## 2022-02-11 DIAGNOSIS — Z66 Do not resuscitate: Secondary | ICD-10-CM

## 2022-02-11 DIAGNOSIS — Z7189 Other specified counseling: Secondary | ICD-10-CM

## 2022-02-11 DIAGNOSIS — I639 Cerebral infarction, unspecified: Secondary | ICD-10-CM | POA: Diagnosis not present

## 2022-02-11 DIAGNOSIS — Z515 Encounter for palliative care: Secondary | ICD-10-CM

## 2022-02-11 DIAGNOSIS — Z8673 Personal history of transient ischemic attack (TIA), and cerebral infarction without residual deficits: Secondary | ICD-10-CM | POA: Diagnosis not present

## 2022-02-11 DIAGNOSIS — I63512 Cerebral infarction due to unspecified occlusion or stenosis of left middle cerebral artery: Secondary | ICD-10-CM | POA: Diagnosis not present

## 2022-02-11 LAB — GLUCOSE, CAPILLARY
Glucose-Capillary: 114 mg/dL — ABNORMAL HIGH (ref 70–99)
Glucose-Capillary: 136 mg/dL — ABNORMAL HIGH (ref 70–99)
Glucose-Capillary: 116 mg/dL — ABNORMAL HIGH (ref 70–99)
Glucose-Capillary: 183 mg/dL — ABNORMAL HIGH (ref 70–99)
Glucose-Capillary: 105 mg/dL — ABNORMAL HIGH (ref 70–99)
Glucose-Capillary: 115 mg/dL — ABNORMAL HIGH (ref 70–99)

## 2022-02-11 LAB — MAGNESIUM
Magnesium: 2.2 mg/dL (ref 1.7–2.4)
Magnesium: 2 mg/dL (ref 1.7–2.4)

## 2022-02-11 LAB — CBC
HCT: 42.1 % (ref 36.0–46.0)
Hemoglobin: 14.5 g/dL (ref 12.0–15.0)
MCH: 33.5 pg (ref 26.0–34.0)
MCHC: 34.4 g/dL (ref 30.0–36.0)
MCV: 97.2 fL (ref 80.0–100.0)
Platelets: 143 10*3/uL — ABNORMAL LOW (ref 150–400)
RBC: 4.33 MIL/uL (ref 3.87–5.11)
RDW: 13.4 % (ref 11.5–15.5)
WBC: 7.7 10*3/uL (ref 4.0–10.5)
nRBC: 0 % (ref 0.0–0.2)

## 2022-02-11 LAB — PHOSPHORUS
Phosphorus: 2.5 mg/dL (ref 2.5–4.6)
Phosphorus: 2.9 mg/dL (ref 2.5–4.6)

## 2022-02-11 LAB — BASIC METABOLIC PANEL
Anion gap: 10 (ref 5–15)
BUN: 9 mg/dL (ref 8–23)
CO2: 25 mmol/L (ref 22–32)
Calcium: 8.5 mg/dL — ABNORMAL LOW (ref 8.9–10.3)
Chloride: 102 mmol/L (ref 98–111)
Creatinine, Ser: 0.71 mg/dL (ref 0.44–1.00)
GFR, Estimated: >60 mL/min (ref >60)
Glucose, Bld: 148 mg/dL — ABNORMAL HIGH (ref 70–99)
Potassium: 3.6 mmol/L (ref 3.5–5.1)
Sodium: 137 mmol/L (ref 135–145)

## 2022-02-11 MED ORDER — ASPIRIN 81 MG PO CHEW
81.0000 mg | CHEWABLE_TABLET | Freq: Every day | ORAL | Status: DC
  Filled 2022-02-11: qty 1

## 2022-02-11 MED ORDER — ORAL CARE MOUTH RINSE
15.0000 mL | OROMUCOSAL | Status: DC
  Administered 2022-02-11 – 2022-02-13 (×7): 15 mL via OROMUCOSAL

## 2022-02-11 MED ORDER — CEPHALEXIN 250 MG/5ML PO SUSR
500.0000 mg | Freq: Three times a day (TID) | ORAL | Status: AC
  Administered 2022-02-11: 500 mg via ORAL
  Filled 2022-02-11: qty 10

## 2022-02-11 MED ORDER — K PHOS MONO-SOD PHOS DI & MONO 155-852-130 MG PO TABS
250.0000 mg | ORAL_TABLET | Freq: Three times a day (TID) | ORAL | Status: AC
  Administered 2022-02-11 (×3): 250 mg via ORAL
  Filled 2022-02-11 (×3): qty 1

## 2022-02-11 MED ORDER — POLYETHYLENE GLYCOL 3350 17 G PO PACK: 17.0000 g | PACK | Freq: Every day | ORAL | Status: DC

## 2022-02-11 MED ORDER — DOCUSATE SODIUM 100 MG PO CAPS
100.0000 mg | ORAL_CAPSULE | Freq: Two times a day (BID) | ORAL | Status: DC
  Administered 2022-02-11 – 2022-02-13 (×4): 100 mg via ORAL
  Filled 2022-02-11 (×4): qty 1

## 2022-02-11 MED ORDER — LOSARTAN POTASSIUM 50 MG PO TABS
25.0000 mg | ORAL_TABLET | Freq: Every day | ORAL | Status: DC
  Administered 2022-02-11 – 2022-02-13 (×3): 25 mg via ORAL
  Filled 2022-02-11 (×2): qty 1

## 2022-02-11 MED ORDER — AYR SALINE NASAL NA GEL
1.0000 | NASAL | Status: DC | PRN
  Filled 2022-02-11: qty 14.1

## 2022-02-11 MED ORDER — METOPROLOL TARTRATE 25 MG PO TABS
12.5000 mg | ORAL_TABLET | Freq: Two times a day (BID) | ORAL | Status: DC
Start: 2022-02-11 — End: 2022-02-13
  Administered 2022-02-11 – 2022-02-13 (×5): 12.5 mg via ORAL
  Filled 2022-02-11 (×5): qty 1

## 2022-02-11 MED ORDER — METOPROLOL TARTRATE 5 MG/5ML IV SOLN
5.0000 mg | Freq: Four times a day (QID) | INTRAVENOUS | Status: DC
  Administered 2022-02-11: 5 mg via INTRAVENOUS
  Filled 2022-02-11: qty 5

## 2022-02-11 MED ORDER — EZETIMIBE 10 MG PO TABS
10.0000 mg | ORAL_TABLET | Freq: Every day | ORAL | Status: DC
  Administered 2022-02-11 – 2022-02-13 (×3): 10 mg via ORAL
  Filled 2022-02-11 (×2): qty 1

## 2022-02-11 MED ORDER — ASPIRIN 81 MG PO TBEC
81.0000 mg | DELAYED_RELEASE_TABLET | Freq: Every day | ORAL | Status: DC
Start: 2022-02-11 — End: 2022-02-12
  Administered 2022-02-11 – 2022-02-12 (×2): 81 mg via ORAL
  Filled 2022-02-11 (×2): qty 1

## 2022-02-11 MED ORDER — ORAL CARE MOUTH RINSE: 15.0000 mL | OROMUCOSAL | Status: DC | PRN

## 2022-02-11 MED ORDER — POTASSIUM CHLORIDE 10 MEQ/100ML IV SOLN
10.0000 meq | INTRAVENOUS | Status: DC
  Administered 2022-02-11 (×4): 10 meq via INTRAVENOUS
  Filled 2022-02-11 (×2): qty 100

## 2022-02-11 MED ORDER — PANTOPRAZOLE SODIUM 40 MG PO TBEC
40.0000 mg | DELAYED_RELEASE_TABLET | Freq: Every day | ORAL | Status: DC
  Administered 2022-02-11 – 2022-02-13 (×3): 40 mg via ORAL
  Filled 2022-02-11 (×3): qty 1

## 2022-02-11 MED ORDER — POLYETHYLENE GLYCOL 3350 17 G PO PACK
17.0000 g | PACK | Freq: Every day | ORAL | Status: DC
  Administered 2022-02-12: 17 g via ORAL
  Filled 2022-02-11: qty 1

## 2022-02-11 MED ORDER — TRAZODONE HCL 50 MG PO TABS
25.0000 mg | ORAL_TABLET | Freq: Every evening | ORAL | Status: DC | PRN
  Filled 2022-02-11: qty 1

## 2022-02-11 NOTE — Progress Notes (Signed)
NAME:  Linda [name], MRN:  [id number], DOB:  Nov 26, 1933, LOS: 3 ADMISSION DATE:  02/08/2022, CONSULTATION DATE:  8/23 REFERRING MD:  Dr. Curly [place], CHIEF COMPLAINT:  CVA   History of Present Illness:  88 year old female SNF resident with [place] as below, which is significant for HFrEF (40-45%), severe MR s/p mitraclip, AF on eliquis, and DM. Recently admitted to [facility] for epistaxis, which was treated with packing in the ED. She was evaluated by ENT, who decided to leave packing in place until outpatient follow up. Eliquis was held. The then presented again to [facility] ED 8/23 with complaints of right facial droop, right arm weakness, global aphasia and right visual field cut. She was evaluated by neurology per code stroke protocol. CT head non-acute. She was administered IV TNK. Further imaging demonstrated occlusion of the left M3 with 1103m penumbra. [facility]. Procedure was uncomplicated and yeilded a TICI 3 result. Post operatively she was transferred to the ICU on the mechanical ventilator. PCCM asked to consult for medical and vent management.   Pertinent  Medical History   has a past medical history of Anxiety, Arrhythmia, CHF (congestive heart failure) ([place], Colon polyp, Diverticulitis, Dysrhythmia, GERD (gastroesophageal reflux disease), Hard of hearing, Hoarseness, Hyperlipidemia, Hypertension, Kidney stone, Motion sickness, Parathyroid disease ([place], PONV (postoperative nausea and vomiting), Pre-diabetes, PVC (premature ventricular contraction), Skin cancer, Squamous cell carcinoma of skin (07/19/2021), Squamous cell carcinoma of skin (10/13/2014), Squamous cell carcinoma of skin (09/15/2021), [place], TIA (transient ischemic attack) (04/2018), UTI (lower urinary tract infection), Varicose veins of both lower extremities, and Wears hearing aid in both ears.  Significant Hospital Events: Including procedures, antibiotic start and stop dates in  addition to other pertinent events   8/20 -8/21 admit for epistaxis. Discharged with packing in place. Eliquis held 8/23 admit for L MCA CVA. TNK given. Mechanical thrombectomy with TICI 3 8/24 Some issues with hemodynamics overnight, stable this am on assessment.  MRI brain with small acute intra-axial hemorrhage estimated volume 155mseen on CT 8/25 8/25 Intermittent agitation when sedation weaned 8/25 extubated  Interim History / Subjective:  Extubated yesterday without event Having frequent PVCs with underlying rhythm a. Fib.  Per nurse taking very long time to take pills crushed in apple sauce - took over an hour to take tylenol yesterday and 2 hours to take electrolyte replacement.  Objective   Blood pressure (!) 142/88, pulse (!) 59, temperature 98.1 F (36.7 C), temperature source Axillary, resp. rate (!) 32, weight 77.7 kg, SpO2 99 %.    Vent Mode: PSV;CPAP FiO2 (%):  [40 %] 40 % PEEP:  [5 cmH20] 5 cmH20 Pressure Support:  [12 cmH20] 12 cmH20   Intake/Output Summary (Last 24 hours) at 02/11/2022 0817 Last data filed at 02/11/2022 0600 Gross per 24 hour  Intake 1044.47 ml  Output 1675 ml  Net -630.53 ml   Filed Weights   02/11/22 0451  Weight: 77.7 kg    Examination: Gen:      Sitting up in bed, no respiratory distress HEENT:  mmm on nasal cannula Lungs:    lungs are clear, she is able to cough CV:         irregularly irregular, HR in 90s Abd:      + bowel sounds; soft, non-tender; no palpable masses, no distension Ext:    No edema Skin:      Warm and dry; no rashes Neuro:  awake, dozes off easily, tracks and  moves all extremities but does not follow to command. Did not verbalize for me.    Resolved Hospital Problem list   Respiratory failure/intubation.   Assessment & Plan:   Acute CVA; Left M3 occlusion -s/p TNK and mechanical thrombectomy 8/23 Small acute intra-axial hemorrhage, stable on repeat imaging -Estimated volume 25m seen on CT 8/25 P: Primary  management per neurology  Maintain neuro protective measures; goal for eurothermia, euglycemia, eunatermia, normoxia, and PCO2 goal of 35-40 Nutrition and bowel regiment  Seizure precautions  Aspirations precautions  Repeat images per neuro  Ok to get OOB as tolerated with therapies and with nursing. Has some hypoactive delirium likely post-stroke as well as aphasia.   Chronic HFrEF -ECHO 8/24 LVEF to 50-55% with severely dilated LV cavity internal cavity and severe  focal thickening of the basal septal segment measuring 1.7cm. Atrial fibrillation with frequent PVCs P: Continuous telemetry  ASA initiation per neuro  Strict intake and output  Daily weight to assess volume status Closely monitor renal function and electrolytes  Vent support as above  Optimize electrolytes goal K>4. Mag already over 2. Will give IV K replacement given difficulty with PO.  Will start scheduled IV BB to help with rate control and AVN blockade for frequent PVCs.  Generally anticoagulated with Eliquis, but held due to epistaxis as below   Epistaxis with current packing in place -packing removed 8/25 P: No further bleeding Type 2 diabetes  P: Continue SSI  CBG q4 Continue to hold home Farxiga  CBG goal 140-180  Best Practice (right click and "Reselect all SmartList Selections" daily)   Diet/type: taking pureed consistency, advance per SLP DVT prophylaxis: not indicated GI prophylaxis: PPI Lines: N/A Foley:  N/A and Yes, and it is still needed Code Status:  full code Last date of multidisciplinary goals of care discussion: DNR - see ipal note from 8/25   NLenice Llamas MD Pulmonary and [place]ho[place]8/26/2023 8:25 AM Pager: see AMION  If no response to pager, please call critical care on call (see AMION) until 7pm After 7:00 pm call [place]

## 2022-02-11 NOTE — Progress Notes (Signed)
Inpatient Rehab Admissions Coordinator Note:   Per PT patient was screened for CIR candidacy by Baylynn Shifflett Danford Bad, CCC-SLP. At this time, pt appears to be a potential candidate for CIR. I will place an order for rehab consult for full assessment, per our protocol.  Please contact me any with questions.Gayland Curry, [place], [place] Admissions Coordinator [phone] 02/11/22 1:39 PM

## 2022-02-11 NOTE — Evaluation (Signed)
Occupational Therapy Evaluation Patient Details Name: Linda [name] MRN: [id number] DOB: Sep 23, 1933 Today's Date: 02/11/2022   History of Present Illness Pt is an 88 y.o. female who presented to the [facility] ED via EMS as a Code Stroke after sudden onset of right facial droop, right arm weakness, global aphasia and right visual field cut.  Testing revealed L MCA infarct. She received TNK was transferred to [facility] for thrombectomy.  MRI showed small right frontal hematoma at 24 hours of TNK.  PMHx of PAF with Eliquis discontinued 4 days PTA due to severe nosebleed, CHF, HLD, HTN, parathyroid disease, prediabetes, TIAs, and cataract extractions   Clinical Impression   Linda [name] was evaluated s/p the above admission list, she lives at an ALF and is indep with all ADLs, drives and uses RW to mobilize to the dining hall. Upon evaluation pt was limited by impaired communication, generalized weakness (seemingly equal L&R), elevated BP, chronic back pain and impaired cognition. Session limited to chair level tasks with 1x sit<>stand with min A dur to elevated BP. She requires up to max A for ADLs due to deficits listed below. OT to continue to follow acutely. Recommend d/c to AIR for maximal functional recovery towards indep baseline.     Recommendations for follow up therapy are one component of a multi-disciplinary discharge planning process, led by the attending physician.  Recommendations may be updated based on patient status, additional functional criteria and insurance authorization.   Follow Up Recommendations  Acute inpatient rehab (3hours/day)    Assistance Recommended at Discharge Frequent or constant Supervision/Assistance  Patient can return home with the following A lot of help with walking and/or transfers;A lot of help with bathing/dressing/bathroom;Assistance with cooking/housework;Direct supervision/assist for medications management;Direct supervision/assist for financial management;Assist for  transportation;Help with stairs or ramp for entrance;Assistance with feeding    Functional Status Assessment  Patient has had a recent decline in their functional status and demonstrates the ability to make significant improvements in function in a reasonable and predictable amount of time.  Equipment Recommendations  Other (comment) (pending progression)    Recommendations for Other Services Rehab consult     Precautions / Restrictions Precautions Precautions: Fall Precaution Comments: HOH Restrictions Weight Bearing Restrictions: No      Mobility Bed Mobility Overal bed mobility: Needs Assistance             General bed mobility comments: Pt just transferred out of bed with nursing assist.    Transfers Overall transfer level: Needs assistance Equipment used: Rolling walker (2 wheels) Transfers: Sit to/from Stand Sit to Stand: Min assist, +2 safety/equipment           General transfer comment: +2 helpful for lines/safety      Balance Overall balance assessment: Needs assistance Sitting-balance support: Feet supported, No upper extremity supported Sitting balance-Leahy Scale: Fair     Standing balance support: During functional activity, Bilateral upper extremity supported, Reliant on assistive device for balance Standing balance-Leahy Scale: Poor                             ADL either performed or assessed with clinical judgement   ADL Overall ADL's : Needs assistance/impaired Eating/Feeding: Minimal assistance;Sitting   Grooming: Maximal assistance;Sitting   Upper Body Bathing: Maximal assistance;Sitting   Lower Body Bathing: Maximal assistance;Sit to/from stand   Upper Body Dressing : Moderate assistance;Sitting   Lower Body Dressing: Maximal assistance;Sit to/from stand   Toilet Transfer: Minimal assistance;Stand-pivot;BSC/3in1  Toileting- Clothing Manipulation and Hygiene: Maximal assistance;Sit to/from stand        Functional mobility during ADLs: Minimal assistance General ADL Comments: most assist due to impaired cognition, communication and pain. limited to SP transfer for mobility due to elevated BP     Vision Baseline Vision/History: 1 Wears glasses Ability to See in Adequate Light: 0 Adequate Vision Assessment?: No apparent visual deficits     Perception     Praxis      Pertinent Vitals/Pain Pain Assessment Pain Assessment: Faces Faces Pain Scale: Hurts a little bit Pain Location: back (chronic) Pain Descriptors / Indicators: Grimacing, Discomfort Pain Intervention(s): Limited activity within patient's tolerance, Monitored during session     Hand Dominance Right   Extremity/Trunk Assessment Upper Extremity Assessment Upper Extremity Assessment: RUE deficits/detail;LUE deficits/detail RUE Deficits / Details: difficult to assess due to communication and apparent pain at R IV site. pt moving well with good strength; sensory motor impairment noted. Needs further testing RUE Coordination: decreased fine motor;decreased gross motor LUE Deficits / Details: Seemingly WFL for tasks assessed. Pt using non-dom L hand for most tasks this session LUE Sensation: WNL   Lower Extremity Assessment Lower Extremity Assessment: Defer to PT evaluation   Cervical / Trunk Assessment Cervical / Trunk Assessment: Normal (forward head)   Communication Communication Communication: Receptive difficulties;Expressive difficulties;HOH   Cognition Arousal/Alertness: Awake/alert Behavior During Therapy: Flat affect, Restless Overall Cognitive Status: Difficult to assess                                 General Comments: Receptive and expressive language deficits. Following simple commands 10-20% of trials. Perseveration on tasks. Minimal verbalizations. Asking for water.     General Comments  Elevated BP thorughout session, family present    Exercises     Shoulder Instructions       Home Living Family/patient expects to be discharged to:: Private residence Living Arrangements: Alone Available Help at Discharge: Family Type of Home: Independent living facility Home Access: [place]: One [place]: Conservation officer, nature (2 wheels)   Additional Comments: Pt has resided at [facility] since 04/2021. She was living alone in a house prior to that time.      Prior Functioning/Environment Prior Level of Function : Independent/Modified Independent;Driving             Mobility Comments: no AD in apartment. RW to dining room and in community. ADLs Comments: Drives to get her hair and nails does, and also small grocery shopping trips. She prepares her own breakfast and dinner in her apartment and goes to dining room for lunch.        OT Problem List: Decreased strength;Decreased range of motion;Decreased activity tolerance;Impaired balance (sitting and/or standing);Decreased coordination;Decreased cognition;Decreased safety awareness;Decreased knowledge of use of DME or AE;Decreased knowledge of precautions      OT Treatment/Interventions: Self-care/ADL training;Therapeutic exercise;DME and/or AE instruction    OT Goals(Current goals can be found in the care plan section) Acute Rehab OT Goals Patient Stated Goal: unable to state OT Goal Formulation: With patient Time For Goal Achievement: 02/25/22 Potential to Achieve Goals: Good ADL Goals Pt Will Perform Grooming: with set-up;sitting Pt Will Perform Upper Body Dressing: with set-up;sitting Pt Will Transfer to Toilet: with min assist;ambulating Additional ADL Goal #1: Pt will follow simple one step commands  100% of the session to participate with functional tasks  OT Frequency: Min 2X/week    Co-evaluation PT/OT/SLP Co-Evaluation/Treatment: Yes Reason for Co-Treatment: Complexity of the patient's impairments (multi-system involvement);For patient/therapist  safety;To address functional/ADL transfers PT goals addressed during session: Mobility/safety with mobility;Balance;Proper use of DME OT goals addressed during session: ADL's and self-care      AM-PAC OT "6 Clicks" Daily Activity     Outcome Measure Help from another person eating meals?: A Little Help from another person taking care of personal grooming?: A Lot Help from another person toileting, which includes using toliet, bedpan, or urinal?: A Lot Help from another person bathing (including washing, rinsing, drying)?: A Lot Help from another person to put on and taking off regular upper body clothing?: A Little Help from another person to put on and taking off regular lower body clothing?: A Lot 6 Click Score: 14   End of Session Equipment Utilized During Treatment: Gait belt;Rolling walker (2 wheels) Nurse Communication: Mobility status  Activity Tolerance: Patient tolerated treatment well Patient left: in chair;with call bell/phone within reach;with chair alarm set;with family/visitor present  OT Visit Diagnosis: Unsteadiness on feet (R26.81);Other abnormalities of gait and mobility (R26.89);Muscle weakness (generalized) (M62.81);Hemiplegia and hemiparesis;Cognitive communication deficit (R41.841) Symptoms and signs involving cognitive functions: Cerebral infarction Hemiplegia - Right/Left: Right Hemiplegia - dominant/non-dominant: Dominant Hemiplegia - caused by: Cerebral infarction                Time: 2103-1281 OT Time Calculation (min): 20 min Charges:  OT General Charges $OT Visit: 1 Visit OT Evaluation $OT Eval Moderate Complexity: 1 Mod    Elishia Kaczorowski A Alaya Iverson 02/11/2022, 2:28 PM

## 2022-02-11 NOTE — Evaluation (Addendum)
Physical Therapy Evaluation Patient Details Name: AMI MALLY MRN: [id number] DOB: [birth year] Today's Date: 02/11/2022  History of Present Illness  Pt is an 88 y.o. female who presented to the [facility] ED via EMS as a Code Stroke after sudden onset of right facial droop, right arm weakness, global aphasia and right visual field cut.  Testing revealed L MCA infarct. She received TNK was transferred to [facility] for thrombectomy.  MRI showed small right frontal hematoma at 24 hours of TNK.  PMHx of PAF with Eliquis discontinued 4 days PTA due to severe nosebleed, CHF, HLD, HTN, parathyroid disease, prediabetes, TIAs, and cataract extractions   Clinical Impression  Pt admitted with above diagnosis. PTA pt resided at [facility], mod I mobility with/without RW.  Pt currently with functional limitations due to the deficits listed below (see PT Problem List). On eval, pt required +2 min assist sit to stand with RW. Pt tolerated static stand approx 30 seconds before gesturing that she needed to sit. Increased BP noted with all mobility attempts (see OT note). BLE strength appeared WFL and symmetrical. Unable to get pt to follow commands for MMT. Pt will benefit from skilled PT to increase their independence and safety with mobility to allow discharge to the venue listed below.          Recommendations for follow up therapy are one component of a multi-disciplinary discharge planning process, led by the attending physician.  Recommendations may be updated based on patient status, additional functional criteria and insurance authorization.  Follow Up Recommendations Acute inpatient rehab (3hours/day)      Assistance Recommended at Discharge Frequent or constant Supervision/Assistance  Patient can return home with the following       Equipment Recommendations    Recommendations for Other Services  Rehab consult    Functional Status Assessment Patient has had a recent decline in their functional status and  demonstrates the ability to make significant improvements in function in a reasonable and predictable amount of time.     Precautions / Restrictions Precautions Precautions: Fall      Mobility  Bed Mobility               General bed mobility comments: Pt just transferred out of bed with nursing assist.    Transfers Overall transfer level: Needs assistance Equipment used: Rolling walker (2 wheels) Transfers: Sit to/from Stand Sit to Stand: Min assist, +2 safety/equipment           General transfer comment: +2 helpful for lines/safety    Ambulation/Gait               General Gait Details: unable to progress. Pt requesting to sit after standing approx 30 seconds. Increased BP with all mobility  Stairs            Wheelchair Mobility    Modified Rankin (Stroke Patients Only) Modified Rankin (Stroke Patients Only) Pre-Morbid Rankin Score: No significant disability Modified Rankin: Moderately severe disability     Balance Overall balance assessment: Needs assistance Sitting-balance support: Feet supported, No upper extremity supported Sitting balance-Leahy Scale: Fair     Standing balance support: During functional activity, Bilateral upper extremity supported, Reliant on assistive device for balance Standing balance-Leahy Scale: Poor                               Pertinent Vitals/Pain Pain Assessment Pain Assessment: Faces Faces Pain Scale: Hurts a little bit Pain  Location: back (chronic) Pain Descriptors / Indicators: Grimacing, Discomfort Pain Intervention(s): Repositioned    Home Living Family/patient expects to be discharged to:: Private residence Living Arrangements: Alone Available Help at Discharge: Family Type of Home: Independent living facility Home Access: [place]: One level Home Equipment: Conservation officer, nature (2 wheels) Additional Comments: Pt has resided at [facility] since 04/2021. She was  living alone in a house prior to that time.    Prior Function Prior Level of Function : Independent/Modified Independent;Driving             Mobility Comments: no AD in apartment. RW to dining room and in community. ADLs Comments: Drives to get her hair and nails does, and also small grocery shopping trips. She prepares her own breakfast and dinner in her apartment and goes to dining room for lunch.     Hand Dominance   Dominant Hand: Right    Extremity/Trunk Assessment   Upper Extremity Assessment Upper Extremity Assessment: Defer to OT evaluation    Lower Extremity Assessment Lower Extremity Assessment: Overall WFL for tasks assessed (appears symmetrical)    Cervical / Trunk Assessment Cervical / Trunk Assessment: Normal  Communication   Communication: Receptive difficulties;Expressive difficulties;HOH  Cognition Arousal/Alertness: Awake/alert Behavior During Therapy: Flat affect, Restless Overall Cognitive Status: Difficult to assess                                 General Comments: Receptive and expressive language deficits. Following simple commands 10-20% of trials. Perseveration on tasks. Minimal verbalizations. Asking for water.        General Comments      Exercises     Assessment/Plan    PT Assessment Patient needs continued PT services  PT Problem List Decreased mobility;Decreased safety awareness;Decreased activity tolerance;Decreased balance;Pain       PT Treatment Interventions Therapeutic activities;DME instruction;Cognitive remediation;Gait training;Therapeutic exercise;Patient/family education;Balance training;Functional mobility training    PT Goals (Current goals can be found in the Care Plan section)  Acute Rehab PT Goals Patient Stated Goal: not stated PT Goal Formulation: With patient/family Time For Goal Achievement: 02/25/22 Potential to Achieve Goals: Good    Frequency Min 4X/week     Co-evaluation                AM-PAC PT "6 Clicks" Mobility  Outcome Measure Help needed turning from your back to your side while in a flat bed without using bedrails?: A Little Help needed moving from lying on your back to sitting on the side of a flat bed without using bedrails?: A Lot Help needed moving to and from a bed to a chair (including a wheelchair)?: A Lot Help needed standing up from a chair using your arms (e.g., wheelchair or bedside chair)?: A Little Help needed to walk in hospital room?: A Lot Help needed climbing 3-5 steps with a railing? : Total 6 Click Score: 13    End of Session Equipment Utilized During Treatment: Gait belt Activity Tolerance: Patient tolerated treatment well Patient left: in chair;with call bell/phone within reach;with chair alarm set;with family/visitor present Nurse Communication: Mobility status PT Visit Diagnosis: Other abnormalities of gait and mobility (R26.89)    Time: 2202-5427 PT Time Calculation (min) (ACUTE ONLY): 31 min   Charges:   PT Evaluation $PT Eval Moderate Complexity: 1 Mod          Lorrin Goodell, PT  Office #  [phone] Pager [phone]   Lorriane Shire 02/11/2022, 12:34 PM

## 2022-02-11 NOTE — Progress Notes (Signed)
Referring Physician(s): Code Stroke  Supervising Physician: Linda [name]  Patient Status:  [name] - In-pt  Chief Complaint: Code Stroke Left M3 occlusion  Subjective: Extubated yesterday afternoon.  Not following commands or answering questions, however patient reportedly saying "Yes, No, thank you, and Please have me" to daughter and/or staff.  Moving all extremities spontaneously.   Allergies: Barium iodide, Lipitor [atorvastatin], Statins, Crestor [rosuvastatin calcium], Penicillins, Sulfa antibiotics, and Sulfonamide derivatives  Medications: Prior to Admission medications   Medication Sig Start Date End Date Taking? Authorizing Provider  acetaminophen (TYLENOL) 500 MG tablet Take 500 mg by mouth every 6 (six) hours as needed for moderate pain or headache.   Yes [provider]  cephALEXin (KEFLEX) 500 MG capsule Take 1 capsule (500 mg total) by mouth every 8 (eight) hours for 5 days. Patient taking differently: Take 500 mg by mouth every 8 (eight) hours. 5 day course. 02/06/22 02/11/22 Yes [name], Richard, MD  cetirizine (ZYRTEC) 10 MG tablet Take 10 mg by mouth daily as needed for allergies.   Yes [provider]  furosemide (LASIX) 20 MG tablet Take 20 mg by mouth daily. 01/07/20  Yes [provider]  losartan (COZAAR) 25 MG tablet Take 1 tablet (25 mg total) by mouth daily. 07/21/21  Yes Linda Mc, MD  metoprolol succinate (TOPROL-XL) 25 MG 24 hr tablet Take 12.5 mg by mouth daily. 07/14/21  Yes [provider]  mometasone (ELOCON) 0.1 % cream Apply twice daily as needed to itchy spots at body. Avoid applying to face, groin, and axilla. Use as directed. Long-term use can cause thinning of the skin. Patient taking differently: Apply 1 Application topically 2 (two) times daily as needed (itchy spots). 12/12/21  Yes Linda Patty, MD  oxymetazoline (AFRIN) 0.05 % nasal spray Place 2 sprays into both nostrils 2 (two) times daily as  needed (epistaxis only). 02/06/22  Yes [name], Richard, MD  potassium chloride SA (KLOR-CON M) 20 MEQ tablet Take 1 tablet (20 mEq total) by mouth daily. 10/22/21  Yes Linda Mc, MD  saline (AYR) GEL Place 1 Application into both nostrils every 6 (six) hours. 02/06/22  Yes [name], Richard, MD  traZODone (DESYREL) 50 MG tablet Take 50 mg by mouth at bedtime. 05/05/20  Yes [provider]  apixaban (ELIQUIS) 5 MG TABS tablet Take 5 mg by mouth 2 (two) times daily. Patient not taking: Reported on 02/09/2022    [provider]  dapagliflozin propanediol (FARXIGA) 5 MG TABS tablet Every other day for 14 days,  then daily Patient not taking: Reported on 02/09/2022 11/24/21   Linda Mc, MD     Vital Signs: BP (!) 168/98   Pulse 67   Temp 98.4 F (36.9 C) (Oral)   Resp (!) 23   Wt 171 lb 4.8 oz (77.7 kg)   SpO2 99%   BMI 30.34 kg/m   Physical Exam: NAD, alert, restless, non-conversant during exam.  Neuro: tracking with eyes, moving all extremities, not following commands or attempting to verbalize during visit   Imaging: DG CHEST PORT 1 VIEW  Result Date: 02/10/2022 CLINICAL DATA:  Intubated EXAM: PORTABLE CHEST 1 VIEW COMPARISON:  Chest radiograph 02/08/2022 FINDINGS: The endotracheal tube tip is approximately 4.4 cm from the carina. The enteric catheter tip is off the field of view but the side Holsinger projects in the stomach. The cardiomediastinal silhouette is stable with unchanged cardiomegaly. There is probable left basilar subsegmental atelectasis. There is no new or worsening focal  airspace disease. There is unchanged asymmetric elevation of the right hemidiaphragm. There is no significant pleural effusion. There is no pneumothorax. IMPRESSION: 1. Endotracheal tube tip in the midthoracic trachea. New enteric catheter with the side Star in the stomach. 2. Unchanged aeration of the lungs since 02/08/2022 with no new or worsening focal airspace disease. Electronically  Signed   By: Linda [name] M.D.   On: 02/10/2022 08:00   IR PERCUTANEOUS ART THROMBECTOMY/INFUSION INTRACRANIAL INC DIAG ANGIO  Result Date: 02/10/2022 INDICATION: Acute onset of right sided weakness, global aphasia, and left-sided visual field deficit. Occluded M3 branch of the inferior division of the left middle cerebral artery on CT angiogram of the head and neck. EXAM: 1. EMERGENT LARGE VESSEL OCCLUSION THROMBOLYSIS (anterior CIRCULATION) COMPARISON:  CT of the head and neck of February 08, 2022. MEDICATIONS: Ancef antibiotic was administered within 1 hour of the procedure. ANESTHESIA/SEDATION: General anesthesia. CONTRAST:  Omnipaque 300 approximately 120 mL. FLUOROSCOPY TIME:  Fluoroscopy Time: 64 minutes 45 seconds (1057 mGy). COMPLICATIONS: None immediate. TECHNIQUE: Following a full explanation of the procedure along with the potential associated complications, an informed witnessed consent was obtained. The risks of intracranial hemorrhage of 10%, worsening neurological deficit, ventilator dependency, death and inability to revascularize were all reviewed in detail with the patient's daughter. The patient was then put under general anesthesia by the Department of Anesthesiology at [facility]. The right groin was prepped and draped in the usual sterile fashion. Thereafter using modified Seldinger technique, transfemoral access into the right common femoral artery was obtained without difficulty. Over a 0.035 inch guidewire an 8 French 25 cm Pinnacle sheath was inserted. Through this, and also over a 0.035 inch guidewire a combination a 125 cm 6 French Simmons 2 inside of a 100 cm 088 aspiration catheter combination was advanced to the aortic arch region and selectively positioned in the left common carotid artery. Multiple attempts were made to advanced these two combinations over an 035 inch glidewire and distally in the left internal carotid artery with persistent herniation in this region.  This strategy was abandoned. A 5 French Simmons 2 diagnostic catheter was then advanced into the distal left external carotid artery over an 035 inch glidewire. This in turn was exchanged over an 035 inch 300 cm Rosen exchange guidewire for a 95 cm balloon catheter which was positioned at the origin of the left internal carotid artery. The guidewire was removed. Using biplane roadmap with constant fluoroscopic guidance, in a coaxial manner the 071 132 Zoom aspiration was advanced to the supraclinoid left ICA with the 035 Callus guidewire being advanced to the inferior division M2 segment the left middle cerebral artery. The Zoom aspiration catheter was positioned just inside the origin of the left middle cerebral artery. The Callus wire was removed. Over a 0.014 inch Aristotle soft tip micro guidewire with a J-tip configuration, an 021 60 microcatheter was advanced into the inferior division of the left middle cerebral artery M2 segment and gently advanced through the occluded M3 segment followed by the microcatheter. The guidewire was removed. Good aspiration was obtained from the hub of the catheter. A gentle control arteriogram was then performed through the microcatheter demonstrated antegrade flow. This was then connected to continuous heparinized saline infusion. A 3 mm x 20 mm Solitaire X retrieval device was then advanced to the distal end of microcatheter. The O ring on the delivery microcatheter was loosened. With slight forward gentle traction with the right hand on delivery micro guidewire with the  left hand the microcatheter was retrieved unsheathing the retrieval device. Aspiration catheter was advanced into the distal M1 segment. With proximal flow arrest in the left internal carotid artery aspiration pump at the hub of the Zoom aspiration catheter, and with a 20 mL syringe of the guide catheter, retrieval of combination of reversal of flow arrest, a control arteriogram performed through the balloon  guide catheter demonstrated complete revascularization of the M3 segment of the inferior division left MCA. The left anterior cerebral artery territory remained. The left MCA territory demonstrated a TICI 3 revascularization. Balloon guide catheter was retrieved and removed. An 8 French Angio-Seal closure device was obtained in the right groin puncture site. Distal pulses remained palpable in both feet unchanged. A CT of the brain demonstrated no gross intracranial hemorrhage. Small area of hyperattenuation in the posterior left insular area was suggested of contrast stain versus petechial hemorrhage. Patient was left intubated because of her medical condition. She was then transferred to neuro ICU for post revascularization care. FINDINGS: A control arteriogram performed demonstrated the left external carotid artery and its major branches to be widely patent. The left internal carotid artery at the bulb to the cranial skull base demonstrated wide patency with significant tortuosity in the proximal 1/3. PROCEDURE: As above. IMPRESSION: Status post endovascular revascularization of the M3 branch of the inferior division of the left middle cerebral artery with a 3 mm x 20 mm Solitaire X retrieval device with aspiration achieving a TICI 3 revascularization. Puncture to first pass prolonged due to challenging aortic arch anatomy and prox Lt ICA tortuosity. PLAN: Follow-up as per referring MD. Electronically Signed   By: Linda [name] M.D.   On: 02/10/2022 07:44   IR CT Head Ltd  Result Date: 02/10/2022 INDICATION: Acute onset of right sided weakness, global aphasia, and left-sided visual field deficit. Occluded M3 branch of the inferior division of the left middle cerebral artery on CT angiogram of the head and neck. EXAM: 1. EMERGENT LARGE VESSEL OCCLUSION THROMBOLYSIS (anterior CIRCULATION) COMPARISON:  CT of the head and neck of February 08, 2022. MEDICATIONS: Ancef antibiotic was administered within 1 hour of  the procedure. ANESTHESIA/SEDATION: General anesthesia. CONTRAST:  Omnipaque 300 approximately 120 mL. FLUOROSCOPY TIME:  Fluoroscopy Time: 64 minutes 45 seconds (1057 mGy). COMPLICATIONS: None immediate. TECHNIQUE: Following a full explanation of the procedure along with the potential associated complications, an informed witnessed consent was obtained. The risks of intracranial hemorrhage of 10%, worsening neurological deficit, ventilator dependency, death and inability to revascularize were all reviewed in detail with the patient's daughter. The patient was then put under general anesthesia by the Department of Anesthesiology at [facility]. The right groin was prepped and draped in the usual sterile fashion. Thereafter using modified Seldinger technique, transfemoral access into the right common femoral artery was obtained without difficulty. Over a 0.035 inch guidewire an 8 French 25 cm Pinnacle sheath was inserted. Through this, and also over a 0.035 inch guidewire a combination a 125 cm 6 French Simmons 2 inside of a 100 cm 088 aspiration catheter combination was advanced to the aortic arch region and selectively positioned in the left common carotid artery. Multiple attempts were made to advanced these two combinations over an 035 inch glidewire and distally in the left internal carotid artery with persistent herniation in this region. This strategy was abandoned. A 5 French Simmons 2 diagnostic catheter was then advanced into the distal left external carotid artery over an 035 inch glidewire. This in turn was  exchanged over an 035 inch 300 cm Rosen exchange guidewire for a 95 cm balloon catheter which was positioned at the origin of the left internal carotid artery. The guidewire was removed. Using biplane roadmap with constant fluoroscopic guidance, in a coaxial manner the 071 132 Zoom aspiration was advanced to the supraclinoid left ICA with the 035 Callus guidewire being advanced to the inferior  division M2 segment the left middle cerebral artery. The Zoom aspiration catheter was positioned just inside the origin of the left middle cerebral artery. The Callus wire was removed. Over a 0.014 inch Aristotle soft tip micro guidewire with a J-tip configuration, an 021 60 microcatheter was advanced into the inferior division of the left middle cerebral artery M2 segment and gently advanced through the occluded M3 segment followed by the microcatheter. The guidewire was removed. Good aspiration was obtained from the hub of the catheter. A gentle control arteriogram was then performed through the microcatheter demonstrated antegrade flow. This was then connected to continuous heparinized saline infusion. A 3 mm x 20 mm Solitaire X retrieval device was then advanced to the distal end of microcatheter. The O ring on the delivery microcatheter was loosened. With slight forward gentle traction with the right hand on delivery micro guidewire with the left hand the microcatheter was retrieved unsheathing the retrieval device. Aspiration catheter was advanced into the distal M1 segment. With proximal flow arrest in the left internal carotid artery aspiration pump at the hub of the Zoom aspiration catheter, and with a 20 mL syringe of the guide catheter, retrieval of combination of reversal of flow arrest, a control arteriogram performed through the balloon guide catheter demonstrated complete revascularization of the M3 segment of the inferior division left MCA. The left anterior cerebral artery territory remained. The left MCA territory demonstrated a TICI 3 revascularization. Balloon guide catheter was retrieved and removed. An 8 French Angio-Seal closure device was obtained in the right groin puncture site. Distal pulses remained palpable in both feet unchanged. A CT of the brain demonstrated no gross intracranial hemorrhage. Small area of hyperattenuation in the posterior left insular area was suggested of contrast  stain versus petechial hemorrhage. Patient was left intubated because of her medical condition. She was then transferred to neuro ICU for post revascularization care. FINDINGS: A control arteriogram performed demonstrated the left external carotid artery and its major branches to be widely patent. The left internal carotid artery at the bulb to the cranial skull base demonstrated wide patency with significant tortuosity in the proximal 1/3. PROCEDURE: As above. IMPRESSION: Status post endovascular revascularization of the M3 branch of the inferior division of the left middle cerebral artery with a 3 mm x 20 mm Solitaire X retrieval device with aspiration achieving a TICI 3 revascularization. Puncture to first pass prolonged due to challenging aortic arch anatomy and prox Lt ICA tortuosity. PLAN: Follow-up as per referring MD. Electronically Signed   By: Linda [name] M.D.   On: 02/10/2022 07:44   CT HEAD WO CONTRAST (5MM)  Result Date: 02/10/2022 CLINICAL DATA:  88 year old female code stroke presentation 02/08/2022 with left MCA branch occlusion treated TNK and with endovascular revascularization. Subsequent encounter. EXAM: CT HEAD WITHOUT CONTRAST TECHNIQUE: Contiguous axial images were obtained from the base of the skull through the vertex without intravenous contrast. RADIATION DOSE REDUCTION: This exam was performed according to the departmental dose-optimization program which includes automated exposure control, adjustment of the mA and/or kV according to patient size and/or use of iterative reconstruction technique. COMPARISON:  Presentation head CT 02/08/2022. brain MRI yesterday. FINDINGS: Brain: Posterior left MCA territory cytotoxic edema (series 7, image 19) corresponding to abnormal diffusion on the MRI yesterday. Mild petechial hemorrhage there appears not significantly changed. No malignant hemorrhagic transformation. Scattered small areas of restricted diffusion in the left hemisphere, some  visible by CT (mild cortical edema left superior frontal gyrus series 7, image 10). Trace rightward midline shift, with only mild mass effect on the left lateral ventricle. Basilar cisterns remain patent. Small posterior right centrum semi of bowel intra-axial hemorrhage is 8 x 10 mm (series 5, image 39) and was present on MRI yesterday. No significant regional edema or mass effect. Estimated hemorrhage volume up to 1 mL. No other cytotoxic edema identified. Stable gray-white matter differentiation elsewhere. Small chronic cerebellar infarcts. Vascular: Calcified atherosclerosis at the skull base. Skull: No acute osseous abnormality identified. Sinuses/Orbits: Intubated. Fluid in the visible pharynx. Right paranasal sinus opacification has not significantly changed since presentation. Tympanic cavities and mastoids remain clear. Other: No acute orbit or scalp soft tissue finding. IMPRESSION: 1. Small acute intra-axial hemorrhage in the posterior Right Centrum Semiovale is stable since yesterday, estimated volume up to 1 mL. No regional edema or mass effect. 2. Expected CT appearance of Left MCA territory infarcts since MRI yesterday. Mild petechial hemorrhage but no malignant hemorrhagic transformation. 3. Minimal intracranial mass effect, trace rightward midline shift. 4. No new intracranial abnormality. Electronically Signed   By: Genevie Ann M.D.   On: 02/10/2022 05:39   MR BRAIN WO CONTRAST  Result Date: 02/09/2022 CLINICAL DATA:  Stroke, follow up get with MRI brain post-TNK timed 24 hours at APPROX.6 PM on 8/24; Stroke, follow up 24 hour post TNK, obtain at about 6 pm on 8/24 please EXAM: MRI HEAD WITHOUT CONTRAST MRA HEAD WITHOUT CONTRAST TECHNIQUE: Multiplanar, multiecho pulse sequences of the brain and surrounding structures were obtained without intravenous contrast. Angiographic images of the Circle of Willis were obtained using MRA technique without intravenous contrast. COMPARISON:  CTA 02/08/2022.  MRI  May 10, 2018. FINDINGS: MRI HEAD FINDINGS Brain: Confluent acute infarct involving the left parietal and left temporal lobe as well as a small portion of the posterior left insula. Multiple additional small acute infarcts in the posterior left frontal lobe and more superior left parietal lobe. Associated edema with regional mass effect. No midline shift. Additional scattered T2/FLAIR hyperintensities in the white matter, nonspecific but compatible with chronic microvascular ischemic disease. Approximately 8 mm focus of susceptibility artifact and T2 hypointensity in the right precentral gyrus. This finding was not present on the prior 2019 MRI. No hydrocephalus. Remote right cerebellar infarct. Vascular: See below. Skull and upper cervical spine: Normal marrow signal. Sinuses/Orbits: Redemonstrated mucosal edema in the right paranasal sinuses involving the right ethmoid and maxillary sinus with air-fluid level right maxillary sinus. There is diffuse soft tissue thickening in the right nasal cavity. Other: No mastoid effusions. MRA HEAD FINDINGS Anterior circulation: Bilateral intracranial ICAs, MCAs, and ACAs are patent without proximal hemodynamically significant stenosis. The previously occluded left M3 MCA branch is probably now patent. Posterior circulation: Bilateral intradural vertebral arteries, basilar artery and bilateral posterior cerebral arteries are patent without proximal hemodynamically significant stenosis. IMPRESSION: MRI: 1. Approximately 8 mm focus of susceptibility artifact and T2 hypointensity in the right precentral gyrus, possibly representing an age-indeterminate hemorrhage that was not present in 2019. Recommend CT of the head without contrast to evaluate for hyperdense hemorrhage in this region and exclude interval/acute hemorrhage. 2. Acute posterior left MCA  territory infarct, as detailed above 3. Redemonstrated mucosal edema in the right paranasal sinuses involving the right ethmoid  and maxillary sinus with air-fluid level right maxillary sinus. There is diffuse soft tissue thickening in the right nasal cavity which could be due to polyp or tumor. Recommend direct visualization. MRA: No evidence of a proximal large vessel occlusion. The previously occluded left M3 MCA branch is probably now patent, although evaluation is somewhat limited with MRA. These results will be called to the ordering clinician or representative by the Radiologist Assistant, and communication documented in the PACS or [employer]. Electronically Signed   By: Margaretha Sheffield M.D.   On: 02/09/2022 15:43   MR ANGIO HEAD WO CONTRAST  Result Date: 02/09/2022 CLINICAL DATA:  Stroke, follow up get with MRI brain post-TNK timed 24 hours at APPROX.6 PM on 8/24; Stroke, follow up 24 hour post TNK, obtain at about 6 pm on 8/24 please EXAM: MRI HEAD WITHOUT CONTRAST MRA HEAD WITHOUT CONTRAST TECHNIQUE: Multiplanar, multiecho pulse sequences of the brain and surrounding structures were obtained without intravenous contrast. Angiographic images of the Circle of Willis were obtained using MRA technique without intravenous contrast. COMPARISON:  CTA 02/08/2022.  MRI May 10, 2018. FINDINGS: MRI HEAD FINDINGS Brain: Confluent acute infarct involving the left parietal and left temporal lobe as well as a small portion of the posterior left insula. Multiple additional small acute infarcts in the posterior left frontal lobe and more superior left parietal lobe. Associated edema with regional mass effect. No midline shift. Additional scattered T2/FLAIR hyperintensities in the white matter, nonspecific but compatible with chronic microvascular ischemic disease. Approximately 8 mm focus of susceptibility artifact and T2 hypointensity in the right precentral gyrus. This finding was not present on the prior 2019 MRI. No hydrocephalus. Remote right cerebellar infarct. Vascular: See below. Skull and upper cervical spine: Normal  marrow signal. Sinuses/Orbits: Redemonstrated mucosal edema in the right paranasal sinuses involving the right ethmoid and maxillary sinus with air-fluid level right maxillary sinus. There is diffuse soft tissue thickening in the right nasal cavity. Other: No mastoid effusions. MRA HEAD FINDINGS Anterior circulation: Bilateral intracranial ICAs, MCAs, and ACAs are patent without proximal hemodynamically significant stenosis. The previously occluded left M3 MCA branch is probably now patent. Posterior circulation: Bilateral intradural vertebral arteries, basilar artery and bilateral posterior cerebral arteries are patent without proximal hemodynamically significant stenosis. IMPRESSION: MRI: 1. Approximately 8 mm focus of susceptibility artifact and T2 hypointensity in the right precentral gyrus, possibly representing an age-indeterminate hemorrhage that was not present in 2019. Recommend CT of the head without contrast to evaluate for hyperdense hemorrhage in this region and exclude interval/acute hemorrhage. 2. Acute posterior left MCA territory infarct, as detailed above 3. Redemonstrated mucosal edema in the right paranasal sinuses involving the right ethmoid and maxillary sinus with air-fluid level right maxillary sinus. There is diffuse soft tissue thickening in the right nasal cavity which could be due to polyp or tumor. Recommend direct visualization. MRA: No evidence of a proximal large vessel occlusion. The previously occluded left M3 MCA branch is probably now patent, although evaluation is somewhat limited with MRA. These results will be called to the ordering clinician or representative by the Radiologist Assistant, and communication documented in the PACS or [employer]. Electronically Signed   By: Margaretha Sheffield M.D.   On: 02/09/2022 15:43   ECHOCARDIOGRAM COMPLETE  Result Date: 02/09/2022    ECHOCARDIOGRAM REPORT   Patient Name:   Linda [name] Date of Exam: 02/09/2022  Medical Rec #:   [id number]     Height:       63.0 in Accession #:    [id number]    Weight:       172.0 lb Date of Birth:  [birth year]     BSA:          1.813 m Patient Age:    13 years      BP:           108/63 mmHg Patient Gender: F             HR:           70 bpm. Exam Location:  Inpatient Procedure: 2D Echo, Cardiac Doppler and Color Doppler Indications:    Stroke  History:        Patient has prior history of Echocardiogram examinations, most                 recent 09/29/2020. CHF, Stroke and TIA, Arrythmias:PVC; Risk                 Factors:Hypertension and Dyslipidemia.                  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  Sonographer:    Memory Argue Referring Phys: [id number] [place] XU IMPRESSIONS  1. Left ventricular ejection fraction, by estimation, is 50 to 55%. The left ventricle has low normal function. The left ventricle has no regional wall motion abnormalities. The left ventricular internal cavity size was severely dilated. There is severe  focal thickening of the basal septal segment measuring 1.7cm. The rest of the LV segments demonstrate mild left ventricular hypertrophy. Left ventricular diastolic parameters are indeterminate.  2. Right ventricular systolic function is normal. The right ventricular size is mildly enlarged. PASP is 83mHg + RAP.  3. Left atrial size was severely dilated.  4. Right atrial size was severely dilated.  5. The mitral valve has been repaired/replaced. There is a Mitra-Clip present in the mitral position. The mean gradient is 734mg at HR 87bpm. There is moderate mitral regurgitation.  6. Tricuspid valve regurgitation is moderate.  7. The aortic valve is tricuspid. Aortic valve regurgitation is not visualized. Aortic valve sclerosis is present, with no evidence of aortic valve stenosis. Comparison(s): No prior TTE in our system. Compared to prior TTE from OSH, the EF appears to be 50-55% (previously 45-50%), the MR appears slightly improved to  moderate (previously severe), TR appears better at moderate (previously severe), PASP remains elevated although RAP not obtained on current study as patient became combative. FINDINGS  Left Ventricle: Left ventricular ejection fraction, by estimation, is 50 to 55%. The left ventricle has low normal function. The left ventricle has no regional wall motion abnormalities. The left ventricular internal cavity size was severely dilated. There is severe focal thickening of the basal septal segment measuring 1.7cm. The rest of the LV segments demonstrate mild left ventricular hypertrophy. Left ventricular diastolic parameters are indeterminate. Right Ventricle: The right ventricular size is mildly enlarged. No increase in right ventricular wall thickness. Right ventricular systolic function is normal. Left Atrium: Left atrial size was severely dilated. Right Atrium: Right atrial size was severely dilated. Pericardium: There is no evidence of pericardial effusion. Mitral Valve: 87bpm. The mitral valve has been repaired/replaced. Moderate mitral valve regurgitation. There is a Mitra-Clip present in the mitral position. MV peak gradient,  14.6 mmHg. The mean mitral valve gradient is 7.0 mmHg. Tricuspid Valve: The tricuspid valve is normal in structure. Tricuspid valve regurgitation is moderate. Aortic Valve: The aortic valve is tricuspid. Aortic valve regurgitation is not visualized. Aortic valve sclerosis is present, with no evidence of aortic valve stenosis. Aortic valve mean gradient measures 4.0 mmHg. Aortic valve peak gradient measures 8.1  mmHg. Aortic valve area, by VTI measures 2.23 cm. Pulmonic Valve: The pulmonic valve was normal in structure. Pulmonic valve regurgitation is trivial. Aorta: The aortic root is normal in size and structure. IAS/Shunts: There is right bowing of the interatrial septum, suggestive of elevated left atrial pressure. The atrial septum is grossly normal.  LEFT VENTRICLE PLAX 2D LVIDd:          6.40 cm LVIDs:         4.10 cm LV PW:         1.10 cm LV IVS:        1.00 cm LVOT diam:     2.10 cm LV SV:         50 LV SV Index:   27 LVOT Area:     3.46 cm  LV Volumes (MOD) LV vol d, MOD A4C: 137.0 ml LV vol s, MOD A4C: 67.8 ml LV SV MOD A4C:     137.0 ml RIGHT VENTRICLE TAPSE (M-mode): 1.9 cm LEFT ATRIUM         Index LA diam:    5.20 cm 2.87 cm/m  AORTIC VALVE AV Area (Vmax):    2.31 cm AV Area (Vmean):   2.23 cm AV Area (VTI):     2.23 cm AV Vmax:           142.00 cm/s AV Vmean:          93.400 cm/s AV VTI:            0.222 m AV Peak Grad:      8.1 mmHg AV Mean Grad:      4.0 mmHg LVOT Vmax:         94.70 cm/s LVOT Vmean:        60.200 cm/s LVOT VTI:          0.143 m LVOT/AV VTI ratio: 0.64  AORTA Ao Root diam: 3.10 cm MITRAL VALVE                 TRICUSPID VALVE MV Area VTI:  1.26 cm       TR Peak grad:   46.0 mmHg MV Peak grad: 14.6 mmHg      TR Vmax:        339.00 cm/s MV Mean grad: 7.0 mmHg MV Vmax:      1.91 m/s       SHUNTS MV Vmean:     135.0 cm/s     Systemic VTI:  0.14 m MR Peak grad:   121.4 mmHg   Systemic Diam: 2.10 cm MR Mean grad:   74.0 mmHg MR Vmax:        551.00 cm/s MR Vmean:       396.0 cm/s MR PISA:        0.57 cm MR PISA Radius: 0.30 cm Gwyndolyn Kaufman MD Electronically signed by Gwyndolyn Kaufman MD Signature Date/Time: 02/09/2022/1:20:58 PM    Final    DG Abd Portable 1V  Result Date: 02/09/2022 CLINICAL DATA:  Orogastric tube placement EXAM: PORTABLE ABDOMEN - 1 VIEW COMPARISON:  None Available. FINDINGS: Orogastric tube tip overlies the expected mid to distal  body of the stomach. Normal abdominal gas pattern. Cholecystectomy clips seen in the right upper quadrant. Contrast is seen within the renal collecting system bilaterally. No hydronephrosis. IMPRESSION: Orogastric tube tip within the stomach. Electronically Signed   By: Fidela Salisbury M.D.   On: 02/09/2022 01:15   Portable Chest x-ray  Result Date: 02/08/2022 CLINICAL DATA:  ET tube evaluation EXAM: PORTABLE  CHEST 1 VIEW COMPARISON:  02/05/2022 FINDINGS: Endotracheal tube tip is 11 mm above the carina. Cardiomegaly. No confluent airspace opacities or effusions. Mild vascular congestion. Elevation of the right hemidiaphragm, stable. IMPRESSION: Endotracheal tube 11 mm above the carina. Cardiomegaly, vascular congestion. Electronically Signed   By: Rolm Baptise M.D.   On: 02/08/2022 23:23   CT ANGIO HEAD NECK W WO CM W PERF (CODE STROKE)  Result Date: 02/08/2022 CLINICAL DATA:  Acute neuro deficit. Right-sided droop. Abnormal speech EXAM: CT ANGIOGRAPHY HEAD AND NECK CT PERFUSION BRAIN TECHNIQUE: Multidetector CT imaging of the head and neck was performed using the standard protocol during bolus administration of intravenous contrast. Multiplanar CT image reconstructions and MIPs were obtained to evaluate the vascular anatomy. Carotid stenosis measurements (when applicable) are obtained utilizing NASCET criteria, using the distal internal carotid diameter as the denominator. Multiphase CT imaging of the brain was performed following IV bolus contrast injection. Subsequent parametric perfusion maps were calculated using RAPID software. RADIATION DOSE REDUCTION: This exam was performed according to the departmental dose-optimization program which includes automated exposure control, adjustment of the mA and/or kV according to patient size and/or use of iterative reconstruction technique. CONTRAST:  174m OMNIPAQUE IOHEXOL 350 MG/ML SOLN COMPARISON:  CT head 02/08/2022 FINDINGS: CTA NECK FINDINGS Aortic arch: Mild atherosclerotic calcification aortic arch. Bovine branching pattern. Proximal great vessels widely patent. Right carotid system: Right carotid widely patent. Minimal atherosclerotic disease right carotid bifurcation. Left carotid system: Left carotid widely patent. Negative for stenosis. Vertebral arteries: Both vertebral arteries patent to the skull base without stenosis. Skeleton: No acute skeletal  abnormality. Other neck: Negative for mass or adenopathy. Mucosal edema and air-fluid level right maxillary sinus. Mucosal edema right ethmoid sinus. Soft tissue thickening throughout the right nasal cavity which could be due to polyp or tumor. Direct visualization recommended. Upper chest: Lung apices clear bilaterally. Review of the MIP images confirms the above findings CTA HEAD FINDINGS Anterior circulation: Internal carotid artery widely patent through the skull base and cavernous segment without stenosis. Left M1 widely patent. Occlusion of a left M3 branch involving the inferior division of the left MCA. Superior division left MCA patent Anterior cerebral arteries patent bilaterally. Right middle cerebral artery widely patent without stenosis. Posterior circulation: Both vertebral arteries patent to the basilar. PICA patent bilaterally. Basilar widely patent. Superior cerebellar and posterior cerebral arteries patent bilaterally without stenosis or large vessel occlusion. Venous sinuses: Normal venous enhancement Anatomic variants: None Review of the MIP images confirms the above findings CT Brain Perfusion Findings: ASPECTS: 10 CBF (<30%) Volume: 894m  This is in the left parietal lobe. Perfusion (Tmax>6.0s) volume: 14242mMost of the area of delayed perfusion is in the left posterior temporal and parietal lobe however there is a significant amount also in the right hemisphere involving the right frontal lobe and right parietal lobe. This may be artifact on the right. Mismatch Volume: 134m45mowever approximately 80mL35mthis is in the left hemisphere. Infarction Location:Left parietal lobe IMPRESSION: 1. Occlusion left M3 branch supplying the left parietal lobe. 2. CT perfusion reveals 8 mL of core infarct in the  left parietal lobe with surrounding penumbra. Estimated 80 mL of penumbra in the left hemisphere. CT perfusion also shows delayed perfusion in the right frontal and parietal lobe which could be due  to artifact as there is no associated stenosis to this area. 3. No significant carotid or vertebral artery stenosis in the neck 4. These results were called by telephone at the time of interpretation on 02/08/2022 at 7:00 pm to provider ERIC [facility] , who verbally acknowledged these results. 5. Asymmetric mucosal edema in the right paranasal sinuses involving the right ethmoid and maxillary sinus with air-fluid level right maxillary sinus. There is diffuse soft tissue thickening in the right nasal cavity which could be due to polyp or tumor. Recommend direct visualization. Electronically Signed   By: Franchot Gallo M.D.   On: 02/08/2022 19:09   CT HEAD CODE STROKE WO CONTRAST`  Result Date: 02/08/2022 CLINICAL DATA:  Code stroke. Acute neuro deficit. Right facial droop and arm weakness. EXAM: CT HEAD WITHOUT CONTRAST TECHNIQUE: Contiguous axial images were obtained from the base of the skull through the vertex without intravenous contrast. RADIATION DOSE REDUCTION: This exam was performed according to the departmental dose-optimization program which includes automated exposure control, adjustment of the mA and/or kV according to patient size and/or use of iterative reconstruction technique. COMPARISON:  CT head 05/10/2018 FINDINGS: Brain: Mild atrophy and mild white matter hypodensity. Negative for acute infarct, hemorrhage, mass. Vascular: Negative for hyperdense vessel Skull: Negative Sinuses/Orbits: Mucosal edema in the paranasal sinuses. Air-fluid level right maxillary and right sphenoid sinus. Bilateral cataract extraction Other: None ASPECTS ([place] Stroke Program Early CT Score) - Ganglionic level infarction (caudate, lentiform nuclei, internal capsule, insula, M1-M3 cortex): 7 - Supraganglionic infarction (M4-M6 cortex): 3 Total score (0-10 with 10 being normal): 10 IMPRESSION: 1. Atrophy and chronic microvascular ischemic change in the white matter. No acute abnormality. 2. ASPECTS is 10 3. Code stroke  imaging results were communicated on 02/08/2022 at 6:36 pm to provider Lindzen via amion app Electronically Signed   By: Franchot Gallo M.D.   On: 02/08/2022 18:38    Labs:  CBC: Recent Labs    02/08/22 1851 02/09/22 0028 02/09/22 0600 02/10/22 0210 02/11/22 0609  WBC 6.5  --  4.3 9.0 7.7  HGB 16.6* 15.3* 14.6 13.8 14.5  HCT 51.4* 45.0 43.6 40.7 42.1  PLT 186  --  146* 139* 143*     COAGS: Recent Labs    02/05/22 0003 02/08/22 1919  INR 1.6* 1.3*  APTT  --  28     BMP: Recent Labs    02/09/22 0644 02/10/22 0210 02/10/22 2245 02/11/22 0609  NA 137 138 138 137  K 3.7 3.8 3.7 3.6  CL 106 107 106 102  CO2 22 21* 24 25  GLUCOSE 204* 146* 126* 148*  BUN '13 13 11 9  '$ CALCIUM 8.4* 8.5* 8.6* 8.5*  CREATININE 0.57 0.76 0.71 0.71  GFRNONAA >60 >60 >60 >60     LIVER FUNCTION TESTS: Recent Labs    07/06/21 1526 10/19/21 1007 02/08/22 1919  BILITOT 1.4* 1.7* 1.7*  AST '22 24 27  '$ ALT '20 24 23  '$ ALKPHOS 84 91 76  PROT 6.4 6.9 6.7  ALBUMIN 4.0 4.3 3.9     Assessment and Plan: Occluded M3 branch of the inferior division of the left middle cerebral artery. Treatment.  Complete revascularization of the occluded vessel obtained with 1 pass with a 3 mm x 20 mm solitare X retrieval device, aspiration, and proximal flow  arrest achieving aTICI 3 revascularization. Patient extubated.  Alert, restless, sitting up in bed.  Patient moving all extremities spontaneously, however not following commands.  Daugther at bedside reports patient did not sleep last night and has said words intermittently but not consistently.  Further care per Neuro. NIR remains available if needed.   Electronically Signed: Docia Barrier, PA 02/11/2022, 12:37 PM   I spent a total of 15 Minutes at the the patient's bedside AND on the patient's hospital floor or unit, greater than 50% of which was counseling/coordinating care for left M3 occlusion.

## 2022-02-11 NOTE — Consult Note (Signed)
Consultation Note Date: 02/11/2022   Patient Name: Linda [name]  DOB: Oct 25, 1933  MRN: [id number]  Age / Sex: 88 y.o., female  PCP: Crecencio Mc, MD Referring Physician: Stroke, Md, MD  Reason for Consultation: Establishing goals of care  HPI/Patient Profile: 88 y.o. female  with past medical history of  HFrEF (40-45%), severe MR s/p mitraclip, AF on eliquis, and DM admitted on 02/08/2022 with CVA status post TNKase and mechanical thrombectomy 8/23.  Patient also with small acute intra-axial hemorrhage following TNK.  Patient initially required intubation but was extubated 8/25.  SLP evaluation revealed mild aspiration risk.  Some issue with poor intake.  PMT consulted to discuss goals of care per family request.  Clinical Assessment and Goals of Care: I have reviewed medical records including EPIC notes, labs and imaging, received report from RN and occupational therapist, assessed the patient and then met with patient's daughter [name] and daughter-in-law Jaclyn Shaggy to discuss diagnosis prognosis, Lucky, EOL wishes, disposition and options.  I introduced Palliative Medicine as specialized medical care for people living with serious illness. It focuses on providing relief from the symptoms and stress of a serious illness. The goal is to improve quality of life for both the patient and the family.  Family shares patient was living in a retirement community and was independent.  Patient was still driving.  She occasionally used a walker but needed no assistive device to walk around her home.  Family shares patient's spouse passed away 8 years ago and since that time she has struggled with depression and loneliness -frequently making statements that she was ready to die.  Family shares that patient had a nosebleed last week that required ER visit and during that time she stated she was ready to die.   We discussed patient's current illness and what it  means in the larger context of patient's on-going co-morbidities.  Natural disease trajectory and expectations at EOL were discussed.  We discussed patient's stroke and decline in function and appetite since then.  We discussed that she has made significant improvement since hospitalization though she is not at her baseline.  We discussed difficulty with stroke prognostication as it is unclear what her new baseline will be.  We discussed that it is quite possible for ongoing improvement given the improvements that she has had since admission.  Family expresses concern about poor quality of life moving forward that patient may not want to continue with medical interventions or participate with intensive rehab.  They share how much they value patient's previously stated wishes and trying to make medical decisions for her that she would make for herself.  The difference between aggressive medical intervention and comfort care was considered in light of the patient's goals of care.   We discussed continuing current interventions with a trial of CIR to see outpatient response versus focusing more on comfort and transitioning to hospice.  Family shares difficulty with this decision but as we discussed they ultimately would like to trial CIR and meet with [place] again after they see how she responds to CIR.  Advance directives, concepts specific to code status, artificial feeding and hydration, and rehospitalization were considered and discussed.  We reviewed a MOST form and I suggested this may be a good form to complete in order to put some barriers around patient's care to protect her from interventions she would not want.  Family will review MOST form among themselves and with patient's son and we will complete it when we  follow-up with him likely in CIR.  Discussed with family the importance of continued conversation with family and the medical providers regarding overall plan of care and treatment options,  ensuring decisions are within the context of the patients values and GOCs.    Hospice and Palliative Care services outpatient were explained and offered.  Questions and concerns were addressed. The family was encouraged to call with questions or concerns.  Primary Decision Maker NEXT OF KIN    SUMMARY OF RECOMMENDATIONS   Continue current interventions with plan to proceed with CIR PMT will shadow chart for acute decline Family has our information and will call us with any questions or concerns Family request palliative care be reconsulted once they moved to CIR so we can continue goals of care discussions as we see how she responds to CIR therapy Follow-up on MOST form prior to discharge  Code Status/Advance Care Planning: DNR  Discharge Planning:  CIR       Primary Diagnoses: Present on Admission:  Acute ischemic left MCA stroke ([place])  Middle cerebral artery embolism, left   I have reviewed the medical record, interviewed the patient and family, and examined the patient. The following aspects are pertinent.  Past Medical History:  Diagnosis Date   Anxiety    Arrhythmia    paroxysmal atrial fibrillation   CHF (congestive heart failure) (HCC)    Colon polyp    Diverticulitis    Dysrhythmia    GERD (gastroesophageal reflux disease)    Hard of hearing    Hoarseness    Hyperlipidemia    Hypertension    Kidney stone    Motion sickness    Parathyroid disease (HCC)    Parathyroidectomy    PONV (postoperative nausea and vomiting)    Pre-diabetes    PVC (premature ventricular contraction)    Skin cancer    Squamous cell carcinoma of skin 07/19/2021   right lower pretibia lateral, EDC   Squamous cell carcinoma of skin 10/13/2014   R ant neck - KA pattern   Squamous cell carcinoma of skin 09/15/2021   Left dorsal hand, shaved down to fat at time of biopsy so should already be removed.  Will observe for recurrence.   Stroke [facility])    TIA (transient ischemic attack)  04/2018   no deficitis   UTI (lower urinary tract infection)    Varicose veins of both lower extremities    Wears hearing aid in both ears    Social History   Socioeconomic History   Marital status: Widowed    Spouse name: [name]   Number of children: 2   Years of education: 12   Highest education level: Not on file  Occupational History   Occupation: Product/process development scientist: RETIRED    Comment: Retired  Tobacco Use   Smoking status: Never   Smokeless tobacco: Never  Vaping Use   Vaping Use: Never used  Substance and Sexual Activity   Alcohol use: No   Drug use: No   Sexual activity: Not Currently    Birth control/protection: None  Other Topics Concern   Not on file  Social History Narrative   Kayslee is from [place] and [place] farm. Recently widowed. She was married to [place], her husband for 59 years. They have a daughter and a son. She enjoys gardening.   Social Determinants of Health   Financial Resource Strain: Low Risk  (11/01/2021)   Overall Financial Resource Strain (CARDIA)  Difficulty of Paying Living Expenses: Not very hard  Food Insecurity: No Food Insecurity (11/01/2021)   Hunger Vital Sign    Worried About Running Out of Food in the Last Year: Never true    Ran Out of Food in the Last Year: Never true  Transportation Needs: No Transportation Needs (11/01/2021)   PRAPARE - Hydrologist (Medical): No    Lack of Transportation (Non-Medical): No  Physical Activity: Inactive (08/25/2020)   Exercise Vital Sign    Days of Exercise per Week: 0 days    Minutes of Exercise per Session: 0 min  Stress: No Stress Concern Present (11/01/2021)   [place]    Feeling of Stress : Not at all  Social Connections: Moderately Integrated (11/01/2021)   Social Connection and Isolation Panel [NHANES]    Frequency of Communication with Friends and Family: Twice a week     Frequency of Social Gatherings with Friends and Family: Once a week    Attends Religious Services: 1 to 4 times per year    Active Member of [employer] or Organizations: Yes    Attends Archivist Meetings: 1 to 4 times per year    Marital Status: Widowed   Family History  Problem Relation Age of Onset   Heart disease Brother    Stroke Mother    Skin cancer Father    Breast cancer Paternal Grandmother    Scheduled Meds:  aspirin EC  81 mg Oral Daily   Chlorhexidine Gluconate Cloth  6 each Topical Daily   docusate sodium  100 mg Oral BID   ezetimibe  10 mg Oral Daily   feeding supplement  237 mL Oral TID BM   insulin aspart  0-9 Units Subcutaneous Q4H   losartan  25 mg Oral Daily   metoprolol tartrate  12.5 mg Oral BID   mouth rinse  15 mL Mouth Rinse 4 times per day   oxymetazoline  1 spray Each Nare BID   pantoprazole  40 mg Oral Q1200   phosphorus  250 mg Oral TID   [START ON 02/12/2022] polyethylene glycol  17 g Oral Daily   Continuous Infusions: PRN Meds:.acetaminophen **OR** acetaminophen (TYLENOL) oral liquid 160 mg/5 mL **OR** acetaminophen, hydrALAZINE, mouth rinse, saline, senna-docusate Allergies  Allergen Reactions   Barium Iodide     Unknown   Lipitor [Atorvastatin] Other (See Comments)    Myalgias    Statins Other (See Comments)    Myalgias    Crestor [Rosuvastatin Calcium] Other (See Comments)    Myalgias    Penicillins Rash   Sulfa Antibiotics Rash   Sulfonamide Derivatives Rash   Review of Systems  Unable to perform ROS: Other  Espressive aphasia  Physical Exam Constitutional:      General: She is not in acute distress.    Appearance: She is ill-appearing.  Cardiovascular:     Rate and Rhythm: Normal rate. Rhythm irregular.  Pulmonary:     Effort: Pulmonary effort is normal.  Skin:    General: Skin is warm and dry.  Neurological:     Mental Status: She is alert.     Comments: Difficult to assess given expressive aphasia     Vital  Signs: BP (!) 148/88 (BP Location: Right Arm)   Pulse (!) 58   Temp 98.5 F (36.9 C) (Oral)   Resp (!) 23   Wt 77.7 kg   SpO2 97%   BMI 30.34  kg/m  Pain Scale: Faces       SpO2: SpO2: 97 % O2 Device:SpO2: 97 % O2 Flow Rate: .O2 Flow Rate (L/min): 2 L/min  IO: Intake/output summary:  Intake/Output Summary (Last 24 hours) at 02/11/2022 1702 Last data filed at 02/11/2022 1500 Gross per 24 hour  Intake 1503.35 ml  Output 2275 ml  Net -771.65 ml    LBM: Last BM Date :  (pta) Baseline Weight: Weight: 77.7 kg Most recent weight: Weight: 77.7 kg     Palliative Assessment/Data: PPS 50%     *Please note that this is a verbal dictation therefore any spelling or grammatical errors are due to the "[employer]" system interpretation.  Juel Burrow, DNP, AGNP-C Palliative Medicine Team [phone] Pager: [phone]

## 2022-02-11 NOTE — Progress Notes (Addendum)
STROKE TEAM PROGRESS NOTE   INTERVAL HISTORY Her daughter is at the bedside. Sitting up in bedside chair. Extubated yesterday, cortrak in place. Tolerating well. Will put humidifer on oxygen, saline and afrin ordered per outpatient ENT. Plan to transfer out of ICU. CIR workup pending.  CT head yesterday showed small acute right posterior centrum semiovale hemorrhage with expected evolutionary changes in the left MCA infarcts.  Vitals:   02/11/22 0606 02/11/22 0631 02/11/22 0700 02/11/22 0800  BP: (!) 159/72 (!) 147/75 (!) 142/84 (!) 142/88  Pulse: 86 79 66 (!) 59  Resp: (!) 34 (!) 27 (!) 30 (!) 32  Temp:      TempSrc:      SpO2: 93% 96% 94% 99%  Weight:       CBC:  Recent Labs  Lab 02/08/22 1851 02/09/22 0028 02/09/22 0600 02/10/22 0210 02/11/22 0609  WBC 6.5  --  4.3 9.0 7.7  NEUTROABS 4.1  --  3.7  --   --   HGB 16.6*   < > 14.6 13.8 14.5  HCT 51.4*   < > 43.6 40.7 42.1  MCV 98.5  --  97.5 97.4 97.2  PLT 186  --  146* 139* 143*   < > = values in this interval not displayed.    Basic Metabolic Panel:  Recent Labs  Lab 02/10/22 0210 02/10/22 2245 02/11/22 0609  NA 138 138 137  K 3.8 3.7 3.6  CL 107 106 102  CO2 21* 24 25  GLUCOSE 146* 126* 148*  BUN '13 11 9  '$ CREATININE 0.76 0.71 0.71  CALCIUM 8.5* 8.6* 8.5*  MG 1.5* 1.9 2.2  PHOS 3.0  --  2.5    Lipid Panel:  Recent Labs  Lab 02/09/22 0644  CHOL 130  TRIG 25  HDL 48  CHOLHDL 2.7  VLDL 5  LDLCALC 77    HgbA1c:  Recent Labs  Lab 02/09/22 0600  HGBA1C 6.6*    Urine Drug Screen:  Recent Labs  Lab 02/08/22 1945  LABOPIA NONE DETECTED  COCAINSCRNUR NONE DETECTED  LABBENZ NONE DETECTED  AMPHETMU NONE DETECTED  THCU NONE DETECTED  LABBARB NONE DETECTED     Alcohol Level  Recent Labs  Lab 02/08/22 1851  ETH <10     IMAGING past 24 hours No results found.  PHYSICAL EXAM  Physical Exam  Constitutional: Appears well-developed and well-nourished pleasant elderly Caucasian lady.    Cardiovascular: Normal rate and regular rhythm.  Respiratory: Effort normal, non-labored breathing  Neuro: Mental Status: Awake, alert. Globally aphasic. Able to mimic and inconsistently follow simple one step commands.  Cranial Nerves: II: Pupils are equal, round, and reactive to light.   III,IV, VI: EOMI without ptosis or diploplia.  Left gaze preference but able to look to the right V: Facial sensation is symmetric to temperature VII: Mild right facial droop VIII: Hearing intact bilaterally X: Cough and gag intact XI: Head is midline No tongue deviation Motor: Tone is normal. Bulk is normal.  Moves all extremities antigravity but will not cooperate for detailed muscle testing Sensory: Withdraws to pain in all extremities Plantars: Toes are downgoing bilaterally.  Cerebellar: Unable to assess at this time   ASSESSMENT/PLAN Linda [name] is a 88 y.o. female with history of PAF with Eliquis discontinued 4 days ago due to severe nosebleed, HFrEF, HLD, HTN, parathyroid disease, prediabetes, TIAs, and cataract extractions who presents to the [facility] ED via EMS as a Code Stroke after sudden onset of right facial  droop, right arm weakness, global aphasia and right visual field cut.   Stroke:  left MCA M3 occlusion s/p TNK and TICI3 revascularization Etiology:  suspect embolic with Afib not on AC due to recent epistaxis  Code Stroke CT head Atrophy and chronic microvascular ischemic change in the white matter. No acute abnormality. ASPECTS 10.    CTA head & neck  Occlusion left M3 branch supplying the left parietal lobe. No significant carotid or vertebral artery stenosis in the neck. CT perfusion 8 mL of core infarct in the left parietal lobe with surrounding penumbra. Estimated 80 mL of penumbra in the left hemisphere. CT perfusion also shows delayed perfusion in the right frontal and parietal lobe which could be due to artifact as there is no associated stenosis to this area Cerebral  angio Occluded M3 branch of the inferior division of the left middle cerebral artery Post IR CT no evidence of intracranial hemorrhage.  A small hyperattenuation noted in the posterior insular region probably representing contrast stain.  No gross hemorrhage evident. MRI Approximately 8 mm focus of susceptibility artifact and T2 hypointensity in the right precentral gyrus, possibly representing an age-indeterminate hemorrhage, Acute posterior left MCA territory infarct MRA  No LVO, M3 is likely patent REPEAT CT HEAD- Small acute intra-axial hemorrhage in the posterior Right Centrum, Left MCA territory infarcts  2D Echo EF 50-55%, dilated left ventricle, MitraClip present in mitral valve LDL 77 HgbA1c 6.6 VTE prophylaxis - SCDs Eliquis (apixaban) daily prior to admission (which was d/c in setting of nosebleed), now on No antithrombotic.  Start aspirin 81 mg now and anticoagulation in 5 to 7 days Therapy recommendations:  CIR Disposition:  pending  ICH: right frontal small hematoma, likely hemorrhagic transformation status post TNK MRI showed small right frontal hematoma at 24 hours of TNK CT 8/25 confirmed small right frontal hematoma, stable Hold off antithrombotics for now BP goal less than 160  Epistaxis Packed by ENT Dr. Virgia Land at [facility] Orders to remove packing today Afrin and saline ordered from home meds originally ordered by outpatient ENT Hold off on resume [place] for 5-7 days  Atrial fibrillation Patient takes Eliquis for atrial fibrillation at home Eliquis was on hold for four days due to severe epistaxis, large stroke and small hemorrhagic transformation Hold off antithrombotic for now  Hypertension Home meds:  losartan 25, metoprolol 12.5, furosemide 20  Off cleviprex Goal SBP less than 160 due to hemorrhage on MRI Long-term BP goal normotensive  Respiratory failure Patient remained intubated after thrombectomy Ventilator management per CCM Will attempt to extubate after  MRI today if possible   Hypertension Home meds:  losartan 25 mg daily Stable BP goal less than 160 Long-term BP goal normotensive   Hyperlipidemia Home meds:  none LDL 77, goal < 70 No statin due to previous intolerance Add Zetia 10 mg daily Continue zetia at discharge   Diabetes type II Controlled Home meds:  farxiga HgbA1c 6.6, goal < 7.0 CBGs SSI   Dysphagia Post extubation dysphagia Not able to pass a swallow Had a core track On tube feeding  Other Stroke Risk Factors Advanced Age >/= 32  Congestive heart failure   Other Active Problems   Hospital day # 3  Patient seen and examined by NP/APP with MD. MD to update note as needed.   Janine Ores, DNP, FNP-BC [facility] Pager: [phone]  I have personally obtained history,examined this patient, reviewed notes, independently viewed imaging studies, participated in medical decision making and plan  of care.ROS completed by me personally and pertinent positives fully documented  I have made any additions or clarifications directly to the above note. Agree with note above.  Remains aphasic but otherwise hemodynamically stable.  Continue mobilization out of bed.  Ongoing therapy consults.  Speech therapy for swallow eval.  Start aspirin now and change to anticoagulation in 5 to 7 days.  Long discussion with patient and daughter at bedside and answered questions.This patient is critically ill and at significant risk of neurological worsening, death and care requires constant monitoring of vital signs, hemodynamics,respiratory and cardiac monitoring, extensive review of multiple databases, frequent neurological assessment, discussion with family, other specialists and medical decision making of high complexity.I have made any additions or clarifications directly to the above note.This critical care time does not reflect procedure time, or teaching time or supervisory time of PA/NP/Med Resident etc but could  involve care discussion time.  I spent 30 minutes of neurocritical care time  in the care of  this patient.      Antony Contras, MD Medical Director [place] Pager: [phone] 02/11/2022 3:09 PM    To contact Stroke Continuity provider, please refer to [email or web address] After hours, contact General Neurology

## 2022-02-12 DIAGNOSIS — Z8673 Personal history of transient ischemic attack (TIA), and cerebral infarction without residual deficits: Secondary | ICD-10-CM | POA: Diagnosis not present

## 2022-02-12 DIAGNOSIS — I639 Cerebral infarction, unspecified: Secondary | ICD-10-CM | POA: Diagnosis not present

## 2022-02-12 LAB — CBC
HCT: 42.1 % (ref 36.0–46.0)
HCT: 42.5 % (ref 36.0–46.0)
Hemoglobin: 14.6 g/dL (ref 12.0–15.0)
Hemoglobin: 14.9 g/dL (ref 12.0–15.0)
MCH: 33.3 pg (ref 26.0–34.0)
MCH: 33.3 pg (ref 26.0–34.0)
MCHC: 34.7 g/dL (ref 30.0–36.0)
MCHC: 35.1 g/dL (ref 30.0–36.0)
MCV: 96.1 fL (ref 80.0–100.0)
MCV: 95.1 fL (ref 80.0–100.0)
Platelets: 139 10*3/uL — ABNORMAL LOW (ref 150–400)
Platelets: 149 10*3/uL — ABNORMAL LOW (ref 150–400)
RBC: 4.38 MIL/uL (ref 3.87–5.11)
RBC: 4.47 MIL/uL (ref 3.87–5.11)
RDW: 13.3 % (ref 11.5–15.5)
RDW: 13.2 % (ref 11.5–15.5)
WBC: 9.6 10*3/uL (ref 4.0–10.5)
WBC: 8.5 10*3/uL (ref 4.0–10.5)
nRBC: 0 % (ref 0.0–0.2)
nRBC: 0 % (ref 0.0–0.2)

## 2022-02-12 LAB — BASIC METABOLIC PANEL
Anion gap: 12 (ref 5–15)
BUN: 8 mg/dL (ref 8–23)
CO2: 26 mmol/L (ref 22–32)
Calcium: 8.9 mg/dL (ref 8.9–10.3)
Chloride: 101 mmol/L (ref 98–111)
Creatinine, Ser: 0.68 mg/dL (ref 0.44–1.00)
GFR, Estimated: >60 mL/min (ref >60)
Glucose, Bld: 134 mg/dL — ABNORMAL HIGH (ref 70–99)
Potassium: 3.9 mmol/L (ref 3.5–5.1)
Sodium: 139 mmol/L (ref 135–145)

## 2022-02-12 LAB — PHOSPHORUS: Phosphorus: 3 mg/dL (ref 2.5–4.6)

## 2022-02-12 LAB — GLUCOSE, CAPILLARY
Glucose-Capillary: 129 mg/dL — ABNORMAL HIGH (ref 70–99)
Glucose-Capillary: 148 mg/dL — ABNORMAL HIGH (ref 70–99)
Glucose-Capillary: 117 mg/dL — ABNORMAL HIGH (ref 70–99)
Glucose-Capillary: 198 mg/dL — ABNORMAL HIGH (ref 70–99)
Glucose-Capillary: 203 mg/dL — ABNORMAL HIGH (ref 70–99)
Glucose-Capillary: 95 mg/dL (ref 70–99)

## 2022-02-12 LAB — MAGNESIUM: Magnesium: 1.8 mg/dL (ref 1.7–2.4)

## 2022-02-12 MED ORDER — POTASSIUM CHLORIDE CRYS ER 20 MEQ PO TBCR
20.0000 meq | EXTENDED_RELEASE_TABLET | Freq: Every day | ORAL | Status: DC
  Administered 2022-02-12 – 2022-02-13 (×2): 20 meq via ORAL
  Filled 2022-02-12 (×2): qty 1

## 2022-02-12 MED ORDER — FLUOXETINE HCL 10 MG PO CAPS
10.0000 mg | ORAL_CAPSULE | Freq: Every day | ORAL | Status: DC
  Administered 2022-02-12 – 2022-02-13 (×2): 10 mg via ORAL
  Filled 2022-02-12 (×4): qty 1

## 2022-02-12 MED ORDER — LORATADINE 10 MG PO TABS
10.0000 mg | ORAL_TABLET | Freq: Every day | ORAL | Status: DC
Start: 2022-02-12 — End: 2022-02-13
  Administered 2022-02-12 – 2022-02-13 (×2): 10 mg via ORAL
  Filled 2022-02-12 (×2): qty 1

## 2022-02-12 MED ORDER — TRAZODONE HCL 50 MG PO TABS
50.0000 mg | ORAL_TABLET | Freq: Every evening | ORAL | Status: DC | PRN
  Administered 2022-02-12: 50 mg via ORAL
  Filled 2022-02-12: qty 1

## 2022-02-12 MED ORDER — FUROSEMIDE 20 MG PO TABS
20.0000 mg | ORAL_TABLET | Freq: Every day | ORAL | Status: DC
  Administered 2022-02-12 – 2022-02-13 (×2): 20 mg via ORAL
  Filled 2022-02-12 (×2): qty 1

## 2022-02-12 MED ORDER — HYDRALAZINE HCL 20 MG/ML IJ SOLN: 10.0000 mg | Freq: Four times a day (QID) | INTRAMUSCULAR | Status: DC | PRN

## 2022-02-12 NOTE — Progress Notes (Signed)
Inpatient Rehab Admissions:  Inpatient Rehab Consult received.  I met with patient, daughter [name] and her son-in-law at the bedside for rehabilitation assessment and to discuss goals and expectations of an inpatient rehab admission.  Spoke with [name] d/t pt being HOH. She acknowledged understanding of CIR goals and expectations. She informed AC that plan is for pt to d/[facility] back [facility] (ALF versus SNF). Discussed finding out what the requirements would be for pt to qualify for ALF. Will discuss with physiatrist if pt would qualify for reduced burden of care. Will continue to follow.  Signed: Gayland Curry, [place], [place] Admissions Coordinator [phone]

## 2022-02-12 NOTE — Progress Notes (Addendum)
STROKE TEAM PROGRESS NOTE   INTERVAL HISTORY  Palliative care met with family yesterday per family request. Still plan for CIR  Her daughter is at the bedside. Sitting up in bedside chair. Extubated yesterday, cortrak in place. . Plan to transfer out of ICU. CIR workup pending.   It is unclear if she if she slept well last night.  She is alert and interactive today but has aphasia and struggles to communicate  Vital signs stable.  Neurological exam unchanged.  Blood pressure adequately controlled.  Vitals:   02/12/22 0400 02/12/22 0500 02/12/22 0748 02/12/22 0844  BP:    136/75  Pulse:    75  Resp:      Temp: 98.6 F (37 C)  98.7 F (37.1 C)   TempSrc: Oral  Oral   SpO2:      Weight:  82.5 kg     CBC:  Recent Labs  Lab 02/08/22 1851 02/09/22 0028 02/09/22 0600 02/10/22 0210 02/11/22 0609 02/12/22 0535  WBC 6.5  --  4.3   < > 7.7 9.6  NEUTROABS 4.1  --  3.7  --   --   --   HGB 16.6*   < > 14.6   < > 14.5 14.6  HCT 51.4*   < > 43.6   < > 42.1 42.1  MCV 98.5  --  97.5   < > 97.2 96.1  PLT 186  --  146*   < > 143* 139*   < > = values in this interval not displayed.    Basic Metabolic Panel:  Recent Labs  Lab 02/11/22 0609 02/11/22 1629 02/12/22 0535  NA 137  --  139  K 3.6  --  3.9  CL 102  --  101  CO2 25  --  26  GLUCOSE 148*  --  134*  BUN 9  --  8  CREATININE 0.71  --  0.68  CALCIUM 8.5*  --  8.9  MG 2.2 2.0 1.8  PHOS 2.5 2.9 3.0    Lipid Panel:  Recent Labs  Lab 02/09/22 0644  CHOL 130  TRIG 25  HDL 48  CHOLHDL 2.7  VLDL 5  LDLCALC 77    HgbA1c:  Recent Labs  Lab 02/09/22 0600  HGBA1C 6.6*    Urine Drug Screen:  Recent Labs  Lab 02/08/22 1945  LABOPIA NONE DETECTED  COCAINSCRNUR NONE DETECTED  LABBENZ NONE DETECTED  AMPHETMU NONE DETECTED  THCU NONE DETECTED  LABBARB NONE DETECTED     Alcohol Level  Recent Labs  Lab 02/08/22 1851  ETH <10     IMAGING past 24 hours No results found.  PHYSICAL EXAM  Physical Exam   Constitutional: Appears well-developed and well-nourished pleasant elderly Caucasian lady.   Cardiovascular: Normal rate and regular rhythm.  Respiratory: Effort normal, non-labored breathing  Neuro: Mental Status: Awake, alert. Globally aphasic- improving. Able to mimic and inconsistently follow simple one step commands. Starting to speak more words Cranial Nerves: II: Pupils are equal, round, and reactive to light.   III,IV, VI: EOMI without ptosis or diploplia.  Left gaze preference but able to look to the right V: Facial sensation is symmetric to temperature VII: Mild right facial droop VIII: Hearing intact bilaterally X: Cough and gag intact XI: Head is midline No tongue deviation Motor: Tone is normal. Bulk is normal.  Moves all extremities antigravity but will not cooperate for detailed muscle testing Sensory: Withdraws to pain in all extremities Plantars: Toes are downgoing  bilaterally.  Cerebellar: Unable to assess at this time   ASSESSMENT/PLAN [name] is a 88 y.o. female with history of PAF with Eliquis discontinued 4 days ago due to severe nosebleed, HFrEF, HLD, HTN, parathyroid disease, prediabetes, TIAs, and cataract extractions who presents to the [facility] ED via EMS as a Code Stroke after sudden onset of right facial droop, right arm weakness, global aphasia and right visual field cut.   Stroke:  left MCA M3 occlusion s/p TNK and TICI3 revascularization Etiology:  suspect embolic with Afib not on AC due to recent epistaxis  Code Stroke CT head Atrophy and chronic microvascular ischemic change in the white matter. No acute abnormality. ASPECTS 10.    CTA head & neck  Occlusion left M3 branch supplying the left parietal lobe. No significant carotid or vertebral artery stenosis in the neck. CT perfusion 8 mL of core infarct in the left parietal lobe with surrounding penumbra. Estimated 80 mL of penumbra in the left hemisphere. CT perfusion also shows delayed  perfusion in the right frontal and parietal lobe which could be due to artifact as there is no associated stenosis to this area Cerebral angio Occluded M3 branch of the inferior division of the left middle cerebral artery Post IR CT no evidence of intracranial hemorrhage.  A small hyperattenuation noted in the posterior insular region probably representing contrast stain.  No gross hemorrhage evident. MRI Approximately 8 mm focus of susceptibility artifact and T2 hypointensity in the right precentral gyrus, possibly representing an age-indeterminate hemorrhage, Acute posterior left MCA territory infarct MRA  No LVO, M3 is likely patent REPEAT CT HEAD- Small acute intra-axial hemorrhage in the posterior Right Centrum, Left MCA territory infarcts  2D Echo EF 50-55%, dilated left ventricle, MitraClip present in mitral valve LDL 77 HgbA1c 6.6 VTE prophylaxis - SCDs Eliquis (apixaban) daily prior to admission (which was d/c in setting of nosebleed), now on No antithrombotic.  Started aspirin 81 mg now and anticoagulation in 5 to 7 days Therapy recommendations:  CIR Disposition:  pending  ICH: right frontal small hematoma, likely hemorrhagic transformation status post TNK MRI showed small right frontal hematoma at 24 hours of TNK CT 8/25 confirmed small right frontal hematoma, stable Hold off antithrombotics for now BP goal less than 160  Epistaxis Packed by ENT Dr. Virgia Land at [facility] Orders to remove packing today Afrin and saline ordered from home meds originally ordered by outpatient ENT Hold off on resume [place] for 5-7 days  Atrial fibrillation Patient takes Eliquis for atrial fibrillation at home Eliquis was on hold for four days due to severe epistaxis, large stroke and small hemorrhagic transformation Hold off antithrombotic for now  Hypertension Home meds:  losartan 25, metoprolol 12.5, furosemide 20  Off cleviprex Goal SBP less than 160 due to hemorrhage on MRI Long-term BP goal  normotensive  Respiratory failure Patient remained intubated after thrombectomy Ventilator management per CCM Will attempt to extubate after MRI today if possible   Hypertension Home meds:  losartan 25 mg daily Stable BP goal less than 160 Long-term BP goal normotensive   Hyperlipidemia Home meds:  none LDL 77, goal < 70 No statin due to previous intolerance Add Zetia 10 mg daily Continue zetia at discharge   Diabetes type II Controlled Home meds:  farxiga HgbA1c 6.6, goal < 7.0 CBGs SSI   Dysphagia Post extubation dysphagia SLP passed - dysphagia 1 with nectar thick   Other Stroke Risk Factors Advanced Age >/= 6  Congestive heart  failure   Other Active Problems Depression Start fluoxetine 44m  Palliative care consult per family request- husband died 814years ago and patient has been more depressed since then  Hospital day # 4  Patient seen and examined by NP/APP with MD. MD to update note as needed.   DJanine Ores DNP, FNP-BC [facility] Pager: [phone] I have personally obtained history,examined this patient, reviewed notes, independently viewed imaging studies, participated in medical decision making and plan of care.ROS completed by me personally and pertinent positives fully documented  I have made any additions or clarifications directly to the above note. Agree with note above.  Keep systolic blood pressure below 160.  Continue mobilization out of bed.  Physical ,Occupational Therapy and speech therapy consults.  Transfer to neurology floor bed when available.  Continue aspirin for now and will transition to anticoagulation with Eliquis in 5 to 7 days for stroke.  Discussed with daughter at the bedside and answered questions. Greater than 50% time during this 50-minute visit was spent in counseling and coordination of care and discussion patient, her daughter and care team answering questions. PAntony Contras MD Medical Director [facility] Pager: [phone]/27/2023 1:50 PM    To contact Stroke Continuity provider, please refer to [email or web address] After hours, contact General Neurology

## 2022-02-12 NOTE — Progress Notes (Signed)
   02/12/22 1356  Provider Notification  Provider Name/Title D. Charlean Merl NP, Royal Hawthorn MD  Date Provider Notified 02/12/22  Time Provider Notified 1345  Method of Notification Page  Notification Reason Other (Comment) (patient baseline spits constantly into tissues; now slightly bloody)  Provider response See new orders (d/c aspirin, CBC this afternoon, and in AM)  Date of Provider Response 02/12/22  Time of Provider Response 1345

## 2022-02-12 NOTE — Progress Notes (Signed)
NAME:  Linda [name], MRN:  [id number], DOB:  10/24/1933, LOS: 4 ADMISSION DATE:  02/08/2022, CONSULTATION DATE:  8/23 REFERRING MD:  Dr. Curly [place], CHIEF COMPLAINT:  CVA   History of Present Illness:  88 year old female SNF resident with [place] as below, which is significant for HFrEF (40-45%), severe MR s/p mitraclip, AF on eliquis, and DM. Recently admitted to [facility] for epistaxis, which was treated with packing in the ED. She was evaluated by ENT, who decided to leave packing in place until outpatient follow up. Eliquis was held. The then presented again to [facility] ED 8/23 with complaints of right facial droop, right arm weakness, global aphasia and right visual field cut. She was evaluated by neurology per code stroke protocol. CT head non-acute. She was administered IV TNK. Further imaging demonstrated occlusion of the left M3 with 152m penumbra. [facility]. Procedure was uncomplicated and yeilded a TICI 3 result. Post operatively she was transferred to the ICU on the mechanical ventilator. PCCM asked to consult for medical and vent management.   Pertinent  Medical History   has a past medical history of Anxiety, Arrhythmia, CHF (congestive heart failure) ([place], Colon polyp, Diverticulitis, Dysrhythmia, GERD (gastroesophageal reflux disease), Hard of hearing, Hoarseness, Hyperlipidemia, Hypertension, Kidney stone, Motion sickness, Parathyroid disease ([place], PONV (postoperative nausea and vomiting), Pre-diabetes, PVC (premature ventricular contraction), Skin cancer, Squamous cell carcinoma of skin (07/19/2021), Squamous cell carcinoma of skin (10/13/2014), Squamous cell carcinoma of skin (09/15/2021), [place], TIA (transient ischemic attack) (04/2018), UTI (lower urinary tract infection), Varicose veins of both lower extremities, and Wears hearing aid in both ears.  Significant Hospital Events: Including procedures, antibiotic start and stop dates in  addition to other pertinent events   8/20 -8/21 admit for epistaxis. Discharged with packing in place. Eliquis held 8/23 admit for L MCA CVA. TNK given. Mechanical thrombectomy with TICI 3 8/24 Some issues with hemodynamics overnight, stable this am on assessment.  MRI brain with small acute intra-axial hemorrhage estimated volume 138mseen on CT 8/25 8/25 Intermittent agitation when sedation weaned 8/25 extubated  Interim History / Subjective:  No further epistaxis. PVCs improved. Worked with therapies yesterday and able to take meds orally.   Objective   Blood pressure (!) 163/94, pulse 69, temperature 98.7 F (37.1 C), temperature source Oral, resp. rate (!) 31, weight 82.5 kg, SpO2 96 %.        Intake/Output Summary (Last 24 hours) at 02/12/2022 0953 Last data filed at 02/12/2022 0800 Gross per 24 hour  Intake 1049.56 ml  Output 1650 ml  Net -600.44 ml   Filed Weights   02/11/22 0451 02/12/22 0500  Weight: 77.7 kg 82.5 kg    Examination: Gen:      Siting up in bed, no distress Lungs:    clear, no wheezes CV:         RRR, occasional PVC Abd:      Soft, nontender Ext:    No edema Skin:      Warm and dry; no rashes Neuro:   somnolent but arouses to verbal stimuli, moves all 4 extremities, purposeful, intermittently verbal.      Resolved Hospital Problem list   Respiratory failure/intubation.   Assessment & Plan:   Acute CVA; Left M3 occlusion -s/p TNK and mechanical thrombectomy 8/23 Small acute intra-axial hemorrhage, stable on repeat imaging -Estimated volume 66m63meen on CT 8/25 P: Primary management per neurology  Maintain neuro protective measures; goal for  eurothermia, euglycemia, eunatermia, normoxia, and PCO2 goal of 35-40 Nutrition and bowel regiment  Seizure precautions  Aspirations precautions  Ok to get OOB as tolerated with therapies and with nursing. Has some hypoactive delirium likely post-stroke as well as aphasia.   Chronic HFrEF Atrial  fibrillation with frequent PVCs P: Improved with resuming home BB.  Continuous telemetry  ASA initiation per neuro  Strict intake and output  Daily weight to assess volume status Closely monitor renal function and electrolytes   Epistaxis with current packing in place -packing removed 8/25 P: No further bleeding Type 2 diabetes  P: Continue SSI  CBG q4 Continue to hold home Farxiga  CBG goal 140-180  Best Practice (right click and "Reselect all SmartList Selections" daily)   Diet/type: taking pureed consistency, advance per SLP DVT prophylaxis: not indicated GI prophylaxis: PPI Lines: N/A Foley:  N/A and Yes, and it is still needed Code Status:  full code Last date of multidisciplinary goals of care discussion: DNR - see ipal note from 8/25  PCCM will see as needed. Please call if we can be of further assistance.   Lenice Llamas, MD Pulmonary and [place] 02/12/2022 9:53 AM Pager: see AMION  If no response to pager, please call critical care on call (see AMION) until 7pm After 7:00 pm call [place]

## 2022-02-13 ENCOUNTER — Inpatient Hospital Stay: Payer: Medicare Other | Admitting: Internal Medicine

## 2022-02-13 ENCOUNTER — Encounter (HOSPITAL_COMMUNITY): Payer: Self-pay | Admitting: Physical Medicine and Rehabilitation

## 2022-02-13 ENCOUNTER — Other Ambulatory Visit: Payer: Self-pay

## 2022-02-13 ENCOUNTER — Inpatient Hospital Stay (HOSPITAL_COMMUNITY): Payer: Medicare Other

## 2022-02-13 ENCOUNTER — Ambulatory Visit: Payer: Medicare Other | Admitting: Dermatology

## 2022-02-13 ENCOUNTER — Inpatient Hospital Stay (HOSPITAL_COMMUNITY)
Admission: RE | Admit: 2022-02-13 | Discharge: 2022-02-27 | DRG: 057 | Disposition: A | Discharge status: Skilled Nursing Facility | Payer: Medicare Other | Source: Intra-hospital | Attending: Physical Medicine and Rehabilitation | Admitting: Physical Medicine and Rehabilitation

## 2022-02-13 DIAGNOSIS — I6602 Occlusion and stenosis of left middle cerebral artery: Secondary | ICD-10-CM | POA: Diagnosis not present

## 2022-02-13 DIAGNOSIS — I69320 Aphasia following cerebral infarction: Secondary | ICD-10-CM | POA: Diagnosis not present

## 2022-02-13 DIAGNOSIS — I110 Hypertensive heart disease with heart failure: Secondary | ICD-10-CM | POA: Diagnosis present

## 2022-02-13 DIAGNOSIS — I63512 Cerebral infarction due to unspecified occlusion or stenosis of left middle cerebral artery: Secondary | ICD-10-CM | POA: Diagnosis not present

## 2022-02-13 DIAGNOSIS — Z880 Allergy status to penicillin: Secondary | ICD-10-CM

## 2022-02-13 DIAGNOSIS — R059 Cough, unspecified: Secondary | ICD-10-CM | POA: Diagnosis not present

## 2022-02-13 DIAGNOSIS — E785 Hyperlipidemia, unspecified: Secondary | ICD-10-CM | POA: Diagnosis present

## 2022-02-13 DIAGNOSIS — R040 Epistaxis: Secondary | ICD-10-CM | POA: Diagnosis present

## 2022-02-13 DIAGNOSIS — Z9049 Acquired absence of other specified parts of digestive tract: Secondary | ICD-10-CM

## 2022-02-13 DIAGNOSIS — Z85828 Personal history of other malignant neoplasm of skin: Secondary | ICD-10-CM | POA: Diagnosis not present

## 2022-02-13 DIAGNOSIS — Z66 Do not resuscitate: Secondary | ICD-10-CM | POA: Diagnosis present

## 2022-02-13 DIAGNOSIS — E892 Postprocedural hypoparathyroidism: Secondary | ICD-10-CM | POA: Diagnosis present

## 2022-02-13 DIAGNOSIS — I639 Cerebral infarction, unspecified: Secondary | ICD-10-CM | POA: Diagnosis not present

## 2022-02-13 DIAGNOSIS — H9190 Unspecified hearing loss, unspecified ear: Secondary | ICD-10-CM | POA: Diagnosis present

## 2022-02-13 DIAGNOSIS — E876 Hypokalemia: Secondary | ICD-10-CM | POA: Diagnosis present

## 2022-02-13 DIAGNOSIS — Z9079 Acquired absence of other genital organ(s): Secondary | ICD-10-CM

## 2022-02-13 DIAGNOSIS — G4700 Insomnia, unspecified: Secondary | ICD-10-CM | POA: Diagnosis not present

## 2022-02-13 DIAGNOSIS — E559 Vitamin D deficiency, unspecified: Secondary | ICD-10-CM | POA: Diagnosis present

## 2022-02-13 DIAGNOSIS — Z961 Presence of intraocular lens: Secondary | ICD-10-CM | POA: Diagnosis present

## 2022-02-13 DIAGNOSIS — I5032 Chronic diastolic (congestive) heart failure: Secondary | ICD-10-CM | POA: Diagnosis present

## 2022-02-13 DIAGNOSIS — Z90710 Acquired absence of both cervix and uterus: Secondary | ICD-10-CM | POA: Diagnosis not present

## 2022-02-13 DIAGNOSIS — I69351 Hemiplegia and hemiparesis following cerebral infarction affecting right dominant side: Secondary | ICD-10-CM | POA: Diagnosis present

## 2022-02-13 DIAGNOSIS — F32A Depression, unspecified: Secondary | ICD-10-CM | POA: Diagnosis present

## 2022-02-13 DIAGNOSIS — E119 Type 2 diabetes mellitus without complications: Secondary | ICD-10-CM | POA: Diagnosis present

## 2022-02-13 DIAGNOSIS — E1169 Type 2 diabetes mellitus with other specified complication: Secondary | ICD-10-CM | POA: Diagnosis not present

## 2022-02-13 DIAGNOSIS — Z7901 Long term (current) use of anticoagulants: Secondary | ICD-10-CM

## 2022-02-13 DIAGNOSIS — I69392 Facial weakness following cerebral infarction: Secondary | ICD-10-CM | POA: Diagnosis not present

## 2022-02-13 DIAGNOSIS — R001 Bradycardia, unspecified: Secondary | ICD-10-CM | POA: Diagnosis not present

## 2022-02-13 DIAGNOSIS — I480 Paroxysmal atrial fibrillation: Secondary | ICD-10-CM | POA: Diagnosis present

## 2022-02-13 DIAGNOSIS — Z882 Allergy status to sulfonamides status: Secondary | ICD-10-CM

## 2022-02-13 DIAGNOSIS — F419 Anxiety disorder, unspecified: Secondary | ICD-10-CM | POA: Diagnosis present

## 2022-02-13 DIAGNOSIS — Z888 Allergy status to other drugs, medicaments and biological substances status: Secondary | ICD-10-CM

## 2022-02-13 DIAGNOSIS — Z974 Presence of external hearing-aid: Secondary | ICD-10-CM | POA: Diagnosis not present

## 2022-02-13 DIAGNOSIS — Z8249 Family history of ischemic heart disease and other diseases of the circulatory system: Secondary | ICD-10-CM

## 2022-02-13 DIAGNOSIS — Z79899 Other long term (current) drug therapy: Secondary | ICD-10-CM

## 2022-02-13 DIAGNOSIS — I69312 Visuospatial deficit and spatial neglect following cerebral infarction: Secondary | ICD-10-CM

## 2022-02-13 DIAGNOSIS — K219 Gastro-esophageal reflux disease without esophagitis: Secondary | ICD-10-CM | POA: Diagnosis present

## 2022-02-13 DIAGNOSIS — I4891 Unspecified atrial fibrillation: Secondary | ICD-10-CM | POA: Diagnosis not present

## 2022-02-13 DIAGNOSIS — Z79891 Long term (current) use of opiate analgesic: Secondary | ICD-10-CM

## 2022-02-13 DIAGNOSIS — Z90722 Acquired absence of ovaries, bilateral: Secondary | ICD-10-CM

## 2022-02-13 DIAGNOSIS — Z8673 Personal history of transient ischemic attack (TIA), and cerebral infarction without residual deficits: Secondary | ICD-10-CM

## 2022-02-13 LAB — BASIC METABOLIC PANEL
Anion gap: 8 (ref 5–15)
BUN: 10 mg/dL (ref 8–23)
CO2: 28 mmol/L (ref 22–32)
Calcium: 8.8 mg/dL — ABNORMAL LOW (ref 8.9–10.3)
Chloride: 102 mmol/L (ref 98–111)
Creatinine, Ser: 0.66 mg/dL (ref 0.44–1.00)
GFR, Estimated: >60 mL/min (ref >60)
Glucose, Bld: 102 mg/dL — ABNORMAL HIGH (ref 70–99)
Potassium: 3.5 mmol/L (ref 3.5–5.1)
Sodium: 138 mmol/L (ref 135–145)

## 2022-02-13 LAB — CBC
HCT: 41.4 % (ref 36.0–46.0)
Hemoglobin: 14 g/dL (ref 12.0–15.0)
MCH: 32.7 pg (ref 26.0–34.0)
MCHC: 33.8 g/dL (ref 30.0–36.0)
MCV: 96.7 fL (ref 80.0–100.0)
Platelets: 143 10*3/uL — ABNORMAL LOW (ref 150–400)
RBC: 4.28 MIL/uL (ref 3.87–5.11)
RDW: 13.2 % (ref 11.5–15.5)
WBC: 6.5 10*3/uL (ref 4.0–10.5)
nRBC: 0 % (ref 0.0–0.2)

## 2022-02-13 LAB — GLUCOSE, CAPILLARY
Glucose-Capillary: 115 mg/dL — ABNORMAL HIGH (ref 70–99)
Glucose-Capillary: 123 mg/dL — ABNORMAL HIGH (ref 70–99)
Glucose-Capillary: 193 mg/dL — ABNORMAL HIGH (ref 70–99)
Glucose-Capillary: 186 mg/dL — ABNORMAL HIGH (ref 70–99)
Glucose-Capillary: 194 mg/dL — ABNORMAL HIGH (ref 70–99)
Glucose-Capillary: 138 mg/dL — ABNORMAL HIGH (ref 70–99)

## 2022-02-13 LAB — BRAIN NATRIURETIC PEPTIDE: B Natriuretic Peptide: 509.1 pg/mL — ABNORMAL HIGH (ref 0.0–100.0)

## 2022-02-13 MED ORDER — ENSURE ENLIVE PO LIQD
237.0000 mL | Freq: Three times a day (TID) | ORAL | Status: DC
  Administered 2022-02-14 (×2): 237 mL via ORAL

## 2022-02-13 MED ORDER — PANTOPRAZOLE SODIUM 40 MG PO TBEC
40.0000 mg | DELAYED_RELEASE_TABLET | Freq: Every day | ORAL | Status: DC
  Administered 2022-02-14 – 2022-02-22 (×9): 40 mg via ORAL
  Filled 2022-02-13 (×8): qty 1

## 2022-02-13 MED ORDER — EZETIMIBE 10 MG PO TABS
10.0000 mg | ORAL_TABLET | Freq: Every day | ORAL | Status: DC
  Administered 2022-02-14 – 2022-02-27 (×13): 10 mg via ORAL
  Filled 2022-02-13 (×14): qty 1

## 2022-02-13 MED ORDER — ACETAMINOPHEN 650 MG RE SUPP: 650.0000 mg | RECTAL | Status: DC | PRN

## 2022-02-13 MED ORDER — AYR SALINE NASAL NA GEL
1.0000 | NASAL | Status: DC | PRN
Start: 2022-02-13 — End: 2022-02-27

## 2022-02-13 MED ORDER — FLUOXETINE HCL 10 MG PO CAPS
10.0000 mg | ORAL_CAPSULE | Freq: Every day | ORAL | Status: DC
  Administered 2022-02-14 – 2022-02-23 (×10): 10 mg via ORAL
  Filled 2022-02-13 (×11): qty 1

## 2022-02-13 MED ORDER — DOCUSATE SODIUM 100 MG PO CAPS
100.0000 mg | ORAL_CAPSULE | Freq: Two times a day (BID) | ORAL | 0 refills | Status: AC
Start: 2022-02-13 — End: ?

## 2022-02-13 MED ORDER — ACETAMINOPHEN 325 MG PO TABS: 650.0000 mg | ORAL_TABLET | ORAL | Status: DC | PRN

## 2022-02-13 MED ORDER — FLUOXETINE HCL 10 MG PO CAPS: 10.0000 mg | ORAL_CAPSULE | Freq: Every day | ORAL | 3 refills | Status: DC

## 2022-02-13 MED ORDER — METOPROLOL TARTRATE 25 MG PO TABS: 12.5000 mg | ORAL_TABLET | Freq: Two times a day (BID) | ORAL | 1 refills | Status: DC

## 2022-02-13 MED ORDER — DOCUSATE SODIUM 100 MG PO CAPS
100.0000 mg | ORAL_CAPSULE | Freq: Two times a day (BID) | ORAL | Status: DC
  Administered 2022-02-13 – 2022-02-26 (×21): 100 mg via ORAL
  Filled 2022-02-13 (×26): qty 1

## 2022-02-13 MED ORDER — LOSARTAN POTASSIUM 50 MG PO TABS
25.0000 mg | ORAL_TABLET | Freq: Every day | ORAL | Status: DC
  Administered 2022-02-14 – 2022-02-27 (×14): 25 mg via ORAL
  Filled 2022-02-13 (×14): qty 1

## 2022-02-13 MED ORDER — FUROSEMIDE 10 MG/ML IJ SOLN
40.0000 mg | Freq: Once | INTRAMUSCULAR | Status: AC
  Administered 2022-02-13: 40 mg via INTRAVENOUS
  Filled 2022-02-13: qty 4

## 2022-02-13 MED ORDER — SENNOSIDES-DOCUSATE SODIUM 8.6-50 MG PO TABS: 1.0000 | ORAL_TABLET | Freq: Every evening | ORAL | 1 refills | Status: DC | PRN

## 2022-02-13 MED ORDER — METOPROLOL TARTRATE 12.5 MG HALF TABLET
12.5000 mg | ORAL_TABLET | Freq: Two times a day (BID) | ORAL | Status: DC
  Administered 2022-02-13 – 2022-02-15 (×4): 12.5 mg via ORAL
  Filled 2022-02-13 (×4): qty 1

## 2022-02-13 MED ORDER — ASPIRIN 81 MG PO CHEW
81.0000 mg | CHEWABLE_TABLET | Freq: Every day | ORAL | Status: DC
  Administered 2022-02-13: 81 mg via ORAL
  Filled 2022-02-13: qty 1

## 2022-02-13 MED ORDER — ENSURE ENLIVE PO LIQD
237.0000 mL | Freq: Three times a day (TID) | ORAL | 12 refills | Status: DC
Start: 2022-02-13 — End: 2022-02-27

## 2022-02-13 MED ORDER — EZETIMIBE 10 MG PO TABS: 10.0000 mg | ORAL_TABLET | Freq: Every day | ORAL | 1 refills | Status: AC

## 2022-02-13 MED ORDER — INSULIN ASPART 100 UNIT/ML IJ SOLN
0.0000 [IU] | INTRAMUSCULAR | Status: DC
  Administered 2022-02-14: 1 [IU] via SUBCUTANEOUS
  Administered 2022-02-14 (×2): 2 [IU] via SUBCUTANEOUS

## 2022-02-13 MED ORDER — ASPIRIN 81 MG PO CHEW
81.0000 mg | CHEWABLE_TABLET | Freq: Every day | ORAL | 1 refills | Status: DC
Start: 2022-02-14 — End: 2022-02-27

## 2022-02-13 MED ORDER — ASPIRIN 81 MG PO CHEW
81.0000 mg | CHEWABLE_TABLET | Freq: Every day | ORAL | Status: DC
  Administered 2022-02-14 – 2022-02-22 (×9): 81 mg via ORAL
  Filled 2022-02-13 (×9): qty 1

## 2022-02-13 MED ORDER — SENNOSIDES-DOCUSATE SODIUM 8.6-50 MG PO TABS: 1.0000 | ORAL_TABLET | Freq: Every evening | ORAL | Status: DC | PRN

## 2022-02-13 MED ORDER — LORATADINE 10 MG PO TABS
10.0000 mg | ORAL_TABLET | Freq: Every day | ORAL | Status: DC
  Administered 2022-02-14 – 2022-02-27 (×14): 10 mg via ORAL
  Filled 2022-02-13 (×14): qty 1

## 2022-02-13 MED ORDER — POTASSIUM CHLORIDE CRYS ER 20 MEQ PO TBCR
20.0000 meq | EXTENDED_RELEASE_TABLET | Freq: Every day | ORAL | Status: DC
  Administered 2022-02-14: 20 meq via ORAL
  Filled 2022-02-13: qty 1

## 2022-02-13 MED ORDER — TRAZODONE HCL 50 MG PO TABS
50.0000 mg | ORAL_TABLET | Freq: Every evening | ORAL | Status: DC | PRN
  Administered 2022-02-13 – 2022-02-16 (×4): 50 mg via ORAL
  Filled 2022-02-13 (×4): qty 1

## 2022-02-13 MED ORDER — TRAZODONE HCL 50 MG PO TABS: 50.0000 mg | ORAL_TABLET | Freq: Every evening | ORAL | 1 refills | Status: DC | PRN

## 2022-02-13 MED ORDER — FUROSEMIDE 20 MG PO TABS
20.0000 mg | ORAL_TABLET | Freq: Every day | ORAL | Status: DC
  Administered 2022-02-14 – 2022-02-27 (×14): 20 mg via ORAL
  Filled 2022-02-13 (×14): qty 1

## 2022-02-13 MED ORDER — LIVING WELL WITH DIABETES BOOK
Freq: Once | Status: AC
  Filled 2022-02-13: qty 1

## 2022-02-13 MED ORDER — ACETAMINOPHEN 160 MG/5ML PO SOLN: 650.0000 mg | ORAL | Status: DC | PRN

## 2022-02-13 NOTE — Progress Notes (Signed)
NAME:  Linda [name], MRN:  [id number], DOB:  12/13/1933, LOS: 5 ADMISSION DATE:  02/08/2022, CONSULTATION DATE:  8/23 REFERRING MD:  Dr. Curly Shores, CHIEF COMPLAINT:  CVA   History of Present Illness:  88 year old female SNF resident with [place] as below, which is significant for HFrEF (40-45%), severe MR s/p mitraclip, AF on eliquis, and DM. Recently admitted to [facility] for epistaxis, which was treated with packing in the ED. She was evaluated by ENT, who decided to leave packing in place until outpatient follow up. Eliquis was held. The then presented again to [facility] ED 8/23 with complaints of right facial droop, right arm weakness, global aphasia and right visual field cut. She was evaluated by neurology per code stroke protocol. CT head non-acute. She was administered IV TNK. Further imaging demonstrated occlusion of the left M3 with 1757m penumbra. [facility]. Procedure was uncomplicated and yeilded a TICI 3 result. Post operatively she was transferred to the ICU on the mechanical ventilator. PCCM asked to consult for medical and vent management.   Pertinent  Medical History   has a past medical history of Anxiety, Arrhythmia, CHF (congestive heart failure) ([place], Colon polyp, Diverticulitis, Dysrhythmia, GERD (gastroesophageal reflux disease), Hard of hearing, Hoarseness, Hyperlipidemia, Hypertension, Kidney stone, Motion sickness, Parathyroid disease ([facility] [place], PONV (postoperative nausea and vomiting), Pre-diabetes, PVC (premature ventricular contraction), Skin cancer, Squamous cell carcinoma of skin (07/19/2021), Squamous cell carcinoma of skin (10/13/2014), Squamous cell carcinoma of skin (09/15/2021), Stroke ([place], TIA (transient ischemic attack) (04/2018), UTI (lower urinary tract infection), Varicose veins of both lower extremities, and Wears hearing aid in both ears.  Significant Hospital Events: Including procedures, antibiotic start and stop dates in  addition to other pertinent events   8/20 -8/21 admit for epistaxis. Discharged with packing in place. Eliquis held 8/23 admit for L MCA CVA. TNK given. Mechanical thrombectomy with TICI 3 8/24 Some issues with hemodynamics overnight, stable this am on assessment.  MRI brain with small acute intra-axial hemorrhage estimated volume 162mseen on CT 8/25 8/25 Intermittent agitation when sedation weaned 8/25 extubated 8/28 called back for episodes of small amounts of hemoptysis.    Interim History / Subjective:  More somnolent today. Continues to cough up small amounts of dark bloody sputum.   Objective   Blood pressure 121/70, pulse (!) 53, temperature 97.9 F (36.6 C), temperature source Axillary, resp. rate (!) 22, weight 82.5 kg, SpO2 97 %.        Intake/Output Summary (Last 24 hours) at 02/13/2022 1440 Last data filed at 02/13/2022 1200 Gross per 24 hour  Intake 600 ml  Output 575 ml  Net 25 ml    Filed Weights   02/11/22 0451 02/12/22 0500  Weight: 77.7 kg 82.5 kg    Examination: Gen:      Siting up in bed, no distress Lungs:    clear, no wheezes CV:         RRR, occasional PVC Abd:      Soft, nontender Ext:    No edema Skin:      Warm and dry; no rashes Neuro:   somnolent but arouses to verbal stimuli, moves all 4 extremities, purposeful, intermittently verbal.      Resolved Hospital Problem list   Respiratory failure/intubation.   Assessment & Plan:   Acute CVA; Left M3 occlusion -s/p TNK and mechanical thrombectomy 8/23 Small acute intra-axial hemorrhage, stable on repeat imaging -Estimated volume 57m37meen on CT 8/25 P:  Primary management per neurology  Maintain neuro protective measures; goal for eurothermia, euglycemia, eunatermia, normoxia, and PCO2 goal of 35-40 Nutrition and bowel regiment  Seizure precautions  Aspirations precautions  Ok to get OOB as tolerated with therapies and with nursing. Has some hypoactive delirium likely post-stroke as well  as aphasia.   Chronic HFrEF Atrial fibrillation with frequent PVCs P: Improved with resuming home BB.  Continuous telemetry  ASA initiation per neuro  Strict intake and output  Daily weight to assess volume status Closely monitor renal function and electrolytes   Epistaxis vs hemoptysis.  -packing removed 8/25 P: - diurese today as possibly due to pulmonary edema given CXR consistent with edema and mildly elevated BNP. - consider CT chest if continues to cough up blood, but unclear what interventions would be appropriate at this time especially if remains small volume.  - suspect still having slow oozing epistaxis (was the reason she stopped Eliquis in the first place) and as such might need ENT reevaluation.  - do not feel that there is sufficiently brisk bleeding to be seen on angiography and the impact of sedation on neurological recovery needs to be kept in mind.  Type 2 diabetes  P: Continue SSI  CBG q4 Continue to hold home Farxiga  CBG goal 140-180  Best Practice (right click and "Reselect all SmartList Selections" daily)   Diet/type: taking pureed consistency, advance per SLP DVT prophylaxis: not indicated GI prophylaxis: PPI Lines: N/A Foley:  N/A and Yes, and it is still needed Code Status:  full code Last date of multidisciplinary goals of care discussion: DNR - see ipal note from 8/25  Kipp Brood, MD [facility] ICU Physician [place]  Pager: [phone] Or Epic Secure Chat After hours: [phone].  02/13/2022, 2:53 PM      If no response to pager, please call critical care on call (see AMION) until 7pm After 7:00 pm call El[place]

## 2022-02-13 NOTE — Discharge Instructions (Signed)
Inpatient Rehab Discharge Instructions  [place] Discharge date and time: No discharge date for patient encounter.   Activities/Precautions/ Functional Status: Activity: As tolerated Diet: Diabetic diet Wound Care: Routine skin checks Functional status:  ___ No restrictions     ___ Walk up steps independently ___ 24/7 supervision/assistance   ___ Walk up steps with assistance ___ Intermittent supervision/assistance  ___ Bathe/dress independently ___ Walk with walker     _x__ Bathe/dress with assistance ___ Walk Independently    ___ Shower independently ___ Walk with assistance    ___ Shower with assistance ___ No alcohol     ___ Return to work/school ________  Special Instructions:  No driving smoking or alcohol  My questions have been answered and I understand these instructions. I will adhere to these goals and the provided educational materials after my discharge from the hospital.  Patient/Caregiver Signature _______________________________ Date __________  Clinician Signature _______________________________________ Date __________  Please bring this form and your medication list with you to all your follow-up doctor's appointments.  STROKE/TIA DISCHARGE INSTRUCTIONS SMOKING Cigarette smoking nearly doubles your risk of having a stroke & is the single most alterable risk factor  If you smoke or have smoked in the last 12 months, you are advised to quit smoking for your health. Most of the excess cardiovascular risk related to smoking disappears within a year of stopping. Ask you doctor about anti-smoking medications [place] Quit Line: 1-800-QUIT NOW Free Smoking Cessation Classes [phone]  CHOLESTEROL Know your levels; limit fat & cholesterol in your diet  Lipid Panel     Component Value Date/Time   CHOL 130 02/09/2022 0644   CHOL 254 (H) 11/06/2014 0823   TRIG 25 02/09/2022 0644   TRIG 85 09/07/2009 0000   HDL 48 02/09/2022 0644   HDL 85 11/06/2014 0823   CHOLHDL  2.7 02/09/2022 0644   VLDL 5 02/09/2022 0644   LDLCALC 77 02/09/2022 0644   LDLCALC 154 (H) 11/06/2014 0823     Many patients benefit from treatment even if their cholesterol is at goal. Goal: Total Cholesterol (CHOL) less than 160 Goal:  Triglycerides (TRIG) less than 150 Goal:  HDL greater than 40 Goal:  LDL (LDLCALC) less than 100   BLOOD PRESSURE American Stroke Association blood pressure target is less that 120/80 mm/Hg  Your discharge blood pressure is:  BP: 128/84 Monitor your blood pressure Limit your salt and alcohol intake Many individuals will require more than one medication for high blood pressure  DIABETES (A1c is a blood sugar average for last 3 months) Goal HGBA1c is under 7% (HBGA1c is blood sugar average for last 3 months)  Diabetes:    Lab Results  Component Value Date   HGBA1C 6.6 (H) 02/09/2022    Your HGBA1c can be lowered with medications, healthy diet, and exercise. Check your blood sugar as directed by your physician Call your physician if you experience unexplained or low blood sugars.  PHYSICAL ACTIVITY/REHABILITATION Goal is 30 minutes at least 4 days per week  Activity: Increase activity slowly, Therapies: Physical Therapy: Home Health Return to work:  Activity decreases your risk of heart attack and stroke and makes your heart stronger.  It helps control your weight and blood pressure; helps you relax and can improve your mood. Participate in a regular exercise program. Talk with your doctor about the best form of exercise for you (dancing, walking, swimming, cycling).  DIET/WEIGHT Goal is to maintain a healthy weight  Your discharge diet is:  Diet  Order             Diet Carb Modified Fluid consistency: Thin; Room service appropriate? Yes  Diet effective now                   liquids Your height is:  Height: '5\' 3"'$  (160 cm) Your current weight is: Weight: 75.7 kg Your Body Mass Index (BMI) is:  BMI (Calculated): 29.57 Following the type of  diet specifically designed for you will help prevent another stroke. Your goal weight range is:   Your goal Body Mass Index (BMI) is 19-24. Healthy food habits can help reduce 3 risk factors for stroke:  High cholesterol, hypertension, and excess weight.  RESOURCES Stroke/Support Group:  Call [phone]   STROKE EDUCATION PROVIDED/REVIEWED AND GIVEN TO PATIENT Stroke warning signs and symptoms How to activate emergency medical system (call 911). Medications prescribed at discharge. Need for follow-up after discharge. Personal risk factors for stroke. Pneumonia vaccine given: No Flu vaccine given: No My questions have been answered, the writing is legible, and I understand these instructions.  I will adhere to these goals & educational materials that have been provided to me after my discharge from the hospital.

## 2022-02-13 NOTE — Progress Notes (Signed)
Physical Therapy Treatment Patient Details Name: Linda [name] MRN: [id number] DOB: [birth year] Today's Date: 02/13/2022   History of Present Illness Pt is an 88 y.o. female who presented to the [facility] ED via EMS as a Code Stroke after sudden onset of right facial droop, right arm weakness, global aphasia and right visual field cut.  Testing revealed L MCA infarct. She received TNK was transferred to [facility] for thrombectomy.  MRI showed small right frontal hematoma at 24 hours of TNK.  PMHx of PAF with Eliquis discontinued 4 days PTA due to severe nosebleed, CHF, HLD, HTN, parathyroid disease, prediabetes, TIAs, and cataract extractions    PT Comments    Pt received in bed. Pt trying to communicate with therapist but only speaking jargon. Pt looking down and gesturing to her abdomen. When asked "do you need to go to the bathroom?", "do you need to have a bowel movement?", Pt happily stating "yes" to both. She required min assist bed mobility, mod assist sit to stand, mod assist SPT +2 safety, and min assist amb 3' with RW +2 safety. Pt assisted bed to [facility] then to recliner. Fatigue noted at end of session. Pt reclined with feet elevated.   Recommendations for follow up therapy are one component of a multi-disciplinary discharge planning process, led by the attending physician.  Recommendations may be updated based on patient status, additional functional criteria and insurance authorization.  Follow Up Recommendations  Acute inpatient rehab (3hours/day)     Assistance Recommended at Discharge Frequent or constant Supervision/Assistance  Patient can return home with the following A lot of help with walking and/or transfers;A lot of help with bathing/dressing/bathroom;Assistance with cooking/housework;Direct supervision/assist for medications management;Assist for transportation;Direct supervision/assist for financial management   Equipment Recommendations  None recommended by PT    Recommendations  for Other Services       Precautions / Restrictions Precautions Precautions: Fall;Other (comment) Precaution Comments: HOH     Mobility  Bed Mobility Overal bed mobility: Needs Assistance Bed Mobility: Supine to Sit     Supine to sit: HOB elevated, Min assist     General bed mobility comments: +rail    Transfers Overall transfer level: Needs assistance Equipment used: Rolling walker (2 wheels) Transfers: Sit to/from Stand, Bed to chair/wheelchair/BSC Sit to Stand: Mod assist, +2 safety/equipment Stand pivot transfers: Mod assist, +2 safety/equipment         General transfer comment: pivot transfer bed to [facility] without AD. Sit to stand with RW for pericare.    Ambulation/Gait Ambulation/Gait assistance: Min assist, +2 safety/equipment Gait Distance (Feet): 3 Feet Assistive device: Rolling walker (2 wheels) Gait Pattern/deviations: Step-through pattern, Decreased stride length, Trunk flexed       General Gait Details: cues for posture. Pt fatigues quickly   Stairs             Wheelchair Mobility    Modified Rankin (Stroke Patients Only) Modified Rankin (Stroke Patients Only) Pre-Morbid Rankin Score: No significant disability Modified Rankin: Moderately severe disability     Balance Overall balance assessment: Needs assistance Sitting-balance support: Feet supported, No upper extremity supported Sitting balance-Leahy Scale: Fair     Standing balance support: During functional activity, Bilateral upper extremity supported, Reliant on assistive device for balance Standing balance-Leahy Scale: Poor                              Cognition Arousal/Alertness: Awake/alert Behavior During Therapy: Flat affect, Restless Overall Cognitive  Status: Difficult to assess                                 General Comments: Global aphasia (expressive worse than receptive). Following simple commands 50% of trials. More verbal today.  Difficulty with word finding (frustration noted). Hard to discern receptive language difficulties vs [facility]        Exercises      General Comments General comments (skin integrity, edema, etc.): Pt is now on 2L. SpO2 maintained in 90s. Pt in/out of a fib.      Pertinent Vitals/Pain Pain Assessment Pain Assessment: Faces Faces Pain Scale: No hurt    Home Living                          Prior Function            PT Goals (current goals can now be found in the care plan section) Acute Rehab PT Goals Patient Stated Goal: not stated Progress towards PT goals: Progressing toward goals    Frequency    Min 4X/week      PT Plan Current plan remains appropriate    Co-evaluation              AM-PAC PT "6 Clicks" Mobility   Outcome Measure  Help needed turning from your back to your side while in a flat bed without using bedrails?: A Little Help needed moving from lying on your back to sitting on the side of a flat bed without using bedrails?: A Lot Help needed moving to and from a bed to a chair (including a wheelchair)?: A Lot Help needed standing up from a chair using your arms (e.g., wheelchair or bedside chair)?: A Lot Help needed to walk in hospital room?: A Lot Help needed climbing 3-5 steps with a railing? : Total 6 Click Score: 12    End of Session Equipment Utilized During Treatment: Gait belt;Oxygen Activity Tolerance: Patient tolerated treatment well Patient left: in chair;with call bell/phone within reach;with chair alarm set;with SCD's reapplied Nurse Communication: Mobility status PT Visit Diagnosis: Other abnormalities of gait and mobility (R26.89)     Time: 4982-6415 PT Time Calculation (min) (ACUTE ONLY): 25 min  Charges:  $Gait Training: 8-22 mins $Therapeutic Activity: 8-22 mins                     Lorrin Goodell, PT  Office # [phone] Pager [phone]    Lorriane Shire 02/13/2022, 9:12 AM

## 2022-02-13 NOTE — Discharge Summary (Addendum)
Stroke Discharge Summary  Patient ID: Linda [name]   MRN: [id number]      DOB: Sep 20, 1933  Date of Admission: 02/08/2022 Date of Discharge: 02/13/2022  Attending Physician:  Stroke, Md, MD, Stroke MD Consultant(s):   pulmonary/intensive care and palliative care and ENT Patient's PCP:  Crecencio Mc, MD  Discharge Diagnoses: left MCA branch infarct due to MCA M3 occlusion s/p TNK and TICI3 revascularization etiology suspect embolic with Afib not on AC due to recent epistaxis Principal Problem:   Status post stroke Active Problems:   Acute ischemic left MCA stroke ([place])   Middle cerebral artery embolism, left   Endotracheal tube present   Chronic HFrEF (heart failure with reduced ejection fraction) ([place]) Diabetes Hyperlipidemia Epistaxis Hypertension Atrial fibrillation   Medications to be continued on Rehab Allergies as of 02/13/2022       Reactions   Barium Iodide    Unknown   Lipitor [atorvastatin] Other (See Comments)   Myalgias   Statins Other (See Comments)   Myalgias   Crestor [rosuvastatin Calcium] Other (See Comments)   Myalgias   Penicillins Rash   Sulfa Antibiotics Rash   Sulfonamide Derivatives Rash        Medication List     STOP taking these medications    cephALEXin 500 MG capsule Commonly known as: KEFLEX   dapagliflozin propanediol 5 MG Tabs tablet Commonly known as: Farxiga   Eliquis 5 MG Tabs tablet Generic drug: apixaban   furosemide 20 MG tablet Commonly known as: LASIX   losartan 25 MG tablet Commonly known as: COZAAR   metoprolol succinate 25 MG 24 hr tablet Commonly known as: TOPROL-XL   mometasone 0.1 % cream Commonly known as: ELOCON   potassium chloride SA 20 MEQ tablet Commonly known as: KLOR-CON M       TAKE these medications    acetaminophen 500 MG tablet Commonly known as: TYLENOL Take 500 mg by mouth every 6 (six) hours as needed for moderate pain or headache.   aspirin 81 MG chewable tablet Chew 1  tablet (81 mg total) by mouth daily. Start taking on: February 14, 2022   cetirizine 10 MG tablet Commonly known as: ZYRTEC Take 10 mg by mouth daily as needed for allergies.   docusate sodium 100 MG capsule Commonly known as: COLACE Take 1 capsule (100 mg total) by mouth 2 (two) times daily.   ezetimibe 10 MG tablet Commonly known as: ZETIA Take 1 tablet (10 mg total) by mouth daily. Start taking on: February 14, 2022   feeding supplement Liqd Take 237 mLs by mouth 3 (three) times daily between meals.   FLUoxetine 10 MG capsule Commonly known as: PROZAC Take 1 capsule (10 mg total) by mouth daily.   metoprolol tartrate 25 MG tablet Commonly known as: LOPRESSOR Take 0.5 tablets (12.5 mg total) by mouth 2 (two) times daily.   oxymetazoline 0.05 % nasal spray Commonly known as: AFRIN Place 2 sprays into both nostrils 2 (two) times daily as needed (epistaxis only).   saline Gel Place 1 Application into both nostrils every 6 (six) hours.   senna-docusate 8.6-50 MG tablet Commonly known as: Senokot-S Take 1 tablet by mouth at bedtime as needed for moderate constipation or mild constipation.   traZODone 50 MG tablet Commonly known as: DESYREL Take 1 tablet (50 mg total) by mouth at bedtime as needed for sleep. What changed:  when to take this reasons to take this  LABORATORY STUDIES CBC    Component Value Date/Time   WBC 6.5 02/13/2022 0238   RBC 4.28 02/13/2022 0238   HGB 14.0 02/13/2022 0238   HGB 15.4 05/15/2014 1740   HCT 41.4 02/13/2022 0238   HCT 46.1 05/15/2014 1740   PLT 143 (L) 02/13/2022 0238   PLT 138 (L) 05/15/2014 1740   MCV 96.7 02/13/2022 0238   MCV 94 05/15/2014 1740   MCH 32.7 02/13/2022 0238   MCHC 33.8 02/13/2022 0238   RDW 13.2 02/13/2022 0238   RDW 13.3 05/15/2014 1740   LYMPHSABS 0.5 (L) 02/09/2022 0600   MONOABS 0.1 02/09/2022 0600   EOSABS 0.0 02/09/2022 0600   BASOSABS 0.0 02/09/2022 0600   CMP    Component Value Date/Time    NA 138 02/13/2022 0238   NA 132 (L) 05/15/2014 1740   K 3.5 02/13/2022 0238   K 4.1 05/15/2014 1740   CL 102 02/13/2022 0238   CL 99 05/15/2014 1740   CO2 28 02/13/2022 0238   CO2 26 05/15/2014 1740   GLUCOSE 102 (H) 02/13/2022 0238   GLUCOSE 126 (H) 05/15/2014 1740   BUN 10 02/13/2022 0238   BUN 17 05/15/2014 1740   CREATININE 0.66 02/13/2022 0238   CREATININE 0.86 05/15/2014 1740   CALCIUM 8.8 (L) 02/13/2022 0238   CALCIUM 8.4 (L) 05/15/2014 1740   PROT 6.7 02/08/2022 1919   PROT 6.7 11/06/2014 0823   ALBUMIN 3.9 02/08/2022 1919   ALBUMIN 4.1 11/06/2014 0823   AST 27 02/08/2022 1919   ALT 23 02/08/2022 1919   ALKPHOS 76 02/08/2022 1919   BILITOT 1.7 (H) 02/08/2022 1919   BILITOT 0.9 11/06/2014 0823   GFRNONAA >60 02/13/2022 0238   GFRNONAA >60 05/15/2014 1740   GFRAA >60 05/10/2018 1441   GFRAA >60 05/15/2014 1740   COAGS Lab Results  Component Value Date   INR 1.3 (H) 02/08/2022   INR 1.6 (H) 02/05/2022   INR 1.78 05/11/2018   Lipid Panel    Component Value Date/Time   CHOL 130 02/09/2022 0644   CHOL 254 (H) 11/06/2014 0823   TRIG 25 02/09/2022 0644   TRIG 85 09/07/2009 0000   HDL 48 02/09/2022 0644   HDL 85 11/06/2014 0823   CHOLHDL 2.7 02/09/2022 0644   VLDL 5 02/09/2022 0644   LDLCALC 77 02/09/2022 0644   LDLCALC 154 (H) 11/06/2014 0823   HgbA1C  Lab Results  Component Value Date   HGBA1C 6.6 (H) 02/09/2022   Urinalysis    Component Value Date/Time   COLORURINE STRAW (A) 02/08/2022 1945   APPEARANCEUR CLEAR (A) 02/08/2022 1945   APPEARANCEUR Cloudy (A) 11/15/2017 1358   LABSPEC 1.036 (H) 02/08/2022 1945   PHURINE 7.0 02/08/2022 1945   GLUCOSEU NEGATIVE 02/08/2022 1945   GLUCOSEU NEGATIVE 08/04/2020 [place] 02/08/2022 1945   BILIRUBINUR NEGATIVE 02/08/2022 1945   BILIRUBINUR Negative 03/22/2021 1533   BILIRUBINUR Negative 11/15/2017 1358   KETONESUR 5 (A) 02/08/2022 1945   PROTEINUR NEGATIVE 02/08/2022 1945   UROBILINOGEN  0.2 03/22/2021 1533   UROBILINOGEN 1.0 08/04/2020 1035   NITRITE NEGATIVE 02/08/2022 1945   LEUKOCYTESUR NEGATIVE 02/08/2022 1945   Urine Drug Screen     Component Value Date/Time   LABOPIA NONE DETECTED 02/08/2022 1945   COCAINSCRNUR NONE DETECTED 02/08/2022 1945   LABBENZ NONE DETECTED 02/08/2022 1945   AMPHETMU NONE DETECTED 02/08/2022 1945   THCU NONE DETECTED 02/08/2022 1945   LABBARB NONE DETECTED 02/08/2022 1945    Alcohol Level  Component Value Date/Time   ETH <10 02/08/2022 1851     SIGNIFICANT DIAGNOSTIC STUDIES DG CHEST PORT 1 VIEW  Result Date: 02/10/2022 CLINICAL DATA:  Intubated EXAM: PORTABLE CHEST 1 VIEW COMPARISON:  Chest radiograph 02/08/2022 FINDINGS: The endotracheal tube tip is approximately 4.4 cm from the carina. The enteric catheter tip is off the field of view but the side Larios projects in the stomach. The cardiomediastinal silhouette is stable with unchanged cardiomegaly. There is probable left basilar subsegmental atelectasis. There is no new or worsening focal airspace disease. There is unchanged asymmetric elevation of the right hemidiaphragm. There is no significant pleural effusion. There is no pneumothorax. IMPRESSION: 1. Endotracheal tube tip in the midthoracic trachea. New enteric catheter with the side Tunney in the stomach. 2. Unchanged aeration of the lungs since 02/08/2022 with no new or worsening focal airspace disease. Electronically Signed   By: Valetta Mole M.D.   On: 02/10/2022 08:00   IR PERCUTANEOUS ART THROMBECTOMY/INFUSION INTRACRANIAL INC DIAG ANGIO  Result Date: 02/10/2022 INDICATION: Acute onset of right sided weakness, global aphasia, and left-sided visual field deficit. Occluded M3 branch of the inferior division of the left middle cerebral artery on CT angiogram of the head and neck. EXAM: 1. EMERGENT LARGE VESSEL OCCLUSION THROMBOLYSIS (anterior CIRCULATION) COMPARISON:  CT of the head and neck of February 08, 2022. MEDICATIONS: Ancef  antibiotic was administered within 1 hour of the procedure. ANESTHESIA/SEDATION: General anesthesia. CONTRAST:  Omnipaque 300 approximately 120 mL. FLUOROSCOPY TIME:  Fluoroscopy Time: 64 minutes 45 seconds (1057 mGy). COMPLICATIONS: None immediate. TECHNIQUE: Following a full explanation of the procedure along with the potential associated complications, an informed witnessed consent was obtained. The risks of intracranial hemorrhage of 10%, worsening neurological deficit, ventilator dependency, death and inability to revascularize were all reviewed in detail with the patient's daughter. The patient was then put under general anesthesia by the Department of Anesthesiology at [facility]. The right groin was prepped and draped in the usual sterile fashion. Thereafter using modified Seldinger technique, transfemoral access into the right common femoral artery was obtained without difficulty. Over a 0.035 inch guidewire an 8 French 25 cm Pinnacle sheath was inserted. Through this, and also over a 0.035 inch guidewire a combination a 125 cm 6 French Simmons 2 inside of a 100 cm 088 aspiration catheter combination was advanced to the aortic arch region and selectively positioned in the left common carotid artery. Multiple attempts were made to advanced these two combinations over an 035 inch glidewire and distally in the left internal carotid artery with persistent herniation in this region. This strategy was abandoned. A 5 French Simmons 2 diagnostic catheter was then advanced into the distal left external carotid artery over an 035 inch glidewire. This in turn was exchanged over an 035 inch 300 cm Rosen exchange guidewire for a 95 cm balloon catheter which was positioned at the origin of the left internal carotid artery. The guidewire was removed. Using biplane roadmap with constant fluoroscopic guidance, in a coaxial manner the 071 132 Zoom aspiration was advanced to the supraclinoid left ICA with the 035  Callus guidewire being advanced to the inferior division M2 segment the left middle cerebral artery. The Zoom aspiration catheter was positioned just inside the origin of the left middle cerebral artery. The Callus wire was removed. Over a 0.014 inch Aristotle soft tip micro guidewire with a J-tip configuration, an 021 60 microcatheter was advanced into the inferior division of the left middle cerebral artery M2 segment  and gently advanced through the occluded M3 segment followed by the microcatheter. The guidewire was removed. Good aspiration was obtained from the hub of the catheter. A gentle control arteriogram was then performed through the microcatheter demonstrated antegrade flow. This was then connected to continuous heparinized saline infusion. A 3 mm x 20 mm Solitaire X retrieval device was then advanced to the distal end of microcatheter. The O ring on the delivery microcatheter was loosened. With slight forward gentle traction with the right hand on delivery micro guidewire with the left hand the microcatheter was retrieved unsheathing the retrieval device. Aspiration catheter was advanced into the distal M1 segment. With proximal flow arrest in the left internal carotid artery aspiration pump at the hub of the Zoom aspiration catheter, and with a 20 mL syringe of the guide catheter, retrieval of combination of reversal of flow arrest, a control arteriogram performed through the balloon guide catheter demonstrated complete revascularization of the M3 segment of the inferior division left MCA. The left anterior cerebral artery territory remained. The left MCA territory demonstrated a TICI 3 revascularization. Balloon guide catheter was retrieved and removed. An 8 French Angio-Seal closure device was obtained in the right groin puncture site. Distal pulses remained palpable in both feet unchanged. A CT of the brain demonstrated no gross intracranial hemorrhage. Small area of hyperattenuation in the  posterior left insular area was suggested of contrast stain versus petechial hemorrhage. Patient was left intubated because of her medical condition. She was then transferred to neuro ICU for post revascularization care. FINDINGS: A control arteriogram performed demonstrated the left external carotid artery and its major branches to be widely patent. The left internal carotid artery at the bulb to the cranial skull base demonstrated wide patency with significant tortuosity in the proximal 1/3. PROCEDURE: As above. IMPRESSION: Status post endovascular revascularization of the M3 branch of the inferior division of the left middle cerebral artery with a 3 mm x 20 mm Solitaire X retrieval device with aspiration achieving a TICI 3 revascularization. Puncture to first pass prolonged due to challenging aortic arch anatomy and prox Lt ICA tortuosity. PLAN: Follow-up as per referring MD. Electronically Signed   By: Luanne Bras M.D.   On: 02/10/2022 07:44   IR CT Head Ltd  Result Date: 02/10/2022 INDICATION: Acute onset of right sided weakness, global aphasia, and left-sided visual field deficit. Occluded M3 branch of the inferior division of the left middle cerebral artery on CT angiogram of the head and neck. EXAM: 1. EMERGENT LARGE VESSEL OCCLUSION THROMBOLYSIS (anterior CIRCULATION) COMPARISON:  CT of the head and neck of February 08, 2022. MEDICATIONS: Ancef antibiotic was administered within 1 hour of the procedure. ANESTHESIA/SEDATION: General anesthesia. CONTRAST:  Omnipaque 300 approximately 120 mL. FLUOROSCOPY TIME:  Fluoroscopy Time: 64 minutes 45 seconds (1057 mGy). COMPLICATIONS: None immediate. TECHNIQUE: Following a full explanation of the procedure along with the potential associated complications, an informed witnessed consent was obtained. The risks of intracranial hemorrhage of 10%, worsening neurological deficit, ventilator dependency, death and inability to revascularize were all reviewed in  detail with the patient's daughter. The patient was then put under general anesthesia by the Department of Anesthesiology at [facility]. The right groin was prepped and draped in the usual sterile fashion. Thereafter using modified Seldinger technique, transfemoral access into the right common femoral artery was obtained without difficulty. Over a 0.035 inch guidewire an 8 French 25 cm Pinnacle sheath was inserted. Through this, and also over a 0.035 inch guidewire a  combination a 125 cm 6 [place] Simmons 2 inside of a 100 cm 088 aspiration catheter combination was advanced to the aortic arch region and selectively positioned in the left common carotid artery. Multiple attempts were made to advanced these two combinations over an 035 inch glidewire and distally in the left internal carotid artery with persistent herniation in this region. This strategy was abandoned. A 5 French Simmons 2 diagnostic catheter was then advanced into the distal left external carotid artery over an 035 inch glidewire. This in turn was exchanged over an 035 inch 300 cm Rosen exchange guidewire for a 95 cm balloon catheter which was positioned at the origin of the left internal carotid artery. The guidewire was removed. Using biplane roadmap with constant fluoroscopic guidance, in a coaxial manner the 071 132 Zoom aspiration was advanced to the supraclinoid left ICA with the 035 Callus guidewire being advanced to the inferior division M2 segment the left middle cerebral artery. The Zoom aspiration catheter was positioned just inside the origin of the left middle cerebral artery. The Callus wire was removed. Over a 0.014 inch Aristotle soft tip micro guidewire with a J-tip configuration, an 021 60 microcatheter was advanced into the inferior division of the left middle cerebral artery M2 segment and gently advanced through the occluded M3 segment followed by the microcatheter. The guidewire was removed. Good aspiration was obtained  from the hub of the catheter. A gentle control arteriogram was then performed through the microcatheter demonstrated antegrade flow. This was then connected to continuous heparinized saline infusion. A 3 mm x 20 mm Solitaire X retrieval device was then advanced to the distal end of microcatheter. The O ring on the delivery microcatheter was loosened. With slight forward gentle traction with the right hand on delivery micro guidewire with the left hand the microcatheter was retrieved unsheathing the retrieval device. Aspiration catheter was advanced into the distal M1 segment. With proximal flow arrest in the left internal carotid artery aspiration pump at the hub of the Zoom aspiration catheter, and with a 20 mL syringe of the guide catheter, retrieval of combination of reversal of flow arrest, a control arteriogram performed through the balloon guide catheter demonstrated complete revascularization of the M3 segment of the inferior division left MCA. The left anterior cerebral artery territory remained. The left MCA territory demonstrated a TICI 3 revascularization. Balloon guide catheter was retrieved and removed. An 8 French Angio-Seal closure device was obtained in the right groin puncture site. Distal pulses remained palpable in both feet unchanged. A CT of the brain demonstrated no gross intracranial hemorrhage. Small area of hyperattenuation in the posterior left insular area was suggested of contrast stain versus petechial hemorrhage. Patient was left intubated because of her medical condition. She was then transferred to neuro ICU for post revascularization care. FINDINGS: A control arteriogram performed demonstrated the left external carotid artery and its major branches to be widely patent. The left internal carotid artery at the bulb to the cranial skull base demonstrated wide patency with significant tortuosity in the proximal 1/3. PROCEDURE: As above. IMPRESSION: Status post endovascular  revascularization of the M3 branch of the inferior division of the left middle cerebral artery with a 3 mm x 20 mm Solitaire X retrieval device with aspiration achieving a TICI 3 revascularization. Puncture to first pass prolonged due to challenging aortic arch anatomy and prox Lt ICA tortuosity. PLAN: Follow-up as per referring MD. Electronically Signed   By: Luanne Bras M.D.   On: 02/10/2022 07:44  CT HEAD WO CONTRAST (5MM)  Result Date: 02/10/2022 CLINICAL DATA:  88 year old female code stroke presentation 02/08/2022 with left MCA branch occlusion treated TNK and with endovascular revascularization. Subsequent encounter. EXAM: CT HEAD WITHOUT CONTRAST TECHNIQUE: Contiguous axial images were obtained from the base of the skull through the vertex without intravenous contrast. RADIATION DOSE REDUCTION: This exam was performed according to the departmental dose-optimization program which includes automated exposure control, adjustment of the mA and/or kV according to patient size and/or use of iterative reconstruction technique. COMPARISON:  Presentation head CT 02/08/2022. brain MRI yesterday. FINDINGS: Brain: Posterior left MCA territory cytotoxic edema (series 7, image 19) corresponding to abnormal diffusion on the MRI yesterday. Mild petechial hemorrhage there appears not significantly changed. No malignant hemorrhagic transformation. Scattered small areas of restricted diffusion in the left hemisphere, some visible by CT (mild cortical edema left superior frontal gyrus series 7, image 10). Trace rightward midline shift, with only mild mass effect on the left lateral ventricle. Basilar cisterns remain patent. Small posterior right centrum semi of bowel intra-axial hemorrhage is 8 x 10 mm (series 5, image 39) and was present on MRI yesterday. No significant regional edema or mass effect. Estimated hemorrhage volume up to 1 mL. No other cytotoxic edema identified. Stable gray-white matter  differentiation elsewhere. Small chronic cerebellar infarcts. Vascular: Calcified atherosclerosis at the skull base. Skull: No acute osseous abnormality identified. Sinuses/Orbits: Intubated. Fluid in the visible pharynx. Right paranasal sinus opacification has not significantly changed since presentation. Tympanic cavities and mastoids remain clear. Other: No acute orbit or scalp soft tissue finding. IMPRESSION: 1. Small acute intra-axial hemorrhage in the posterior Right Centrum Semiovale is stable since yesterday, estimated volume up to 1 mL. No regional edema or mass effect. 2. Expected CT appearance of Left MCA territory infarcts since MRI yesterday. Mild petechial hemorrhage but no malignant hemorrhagic transformation. 3. Minimal intracranial mass effect, trace rightward midline shift. 4. No new intracranial abnormality. Electronically Signed   By: Genevie Ann M.D.   On: 02/10/2022 05:39   MR BRAIN WO CONTRAST  Result Date: 02/09/2022 CLINICAL DATA:  Stroke, follow up get with MRI brain post-TNK timed 24 hours at APPROX.6 PM on 8/24; Stroke, follow up 24 hour post TNK, obtain at about 6 pm on 8/24 please EXAM: MRI HEAD WITHOUT CONTRAST MRA HEAD WITHOUT CONTRAST TECHNIQUE: Multiplanar, multiecho pulse sequences of the brain and surrounding structures were obtained without intravenous contrast. Angiographic images of the Circle of Willis were obtained using MRA technique without intravenous contrast. COMPARISON:  CTA 02/08/2022.  MRI May 10, 2018. FINDINGS: MRI HEAD FINDINGS Brain: Confluent acute infarct involving the left parietal and left temporal lobe as well as a small portion of the posterior left insula. Multiple additional small acute infarcts in the posterior left frontal lobe and more superior left parietal lobe. Associated edema with regional mass effect. No midline shift. Additional scattered T2/FLAIR hyperintensities in the white matter, nonspecific but compatible with chronic microvascular  ischemic disease. Approximately 8 mm focus of susceptibility artifact and T2 hypointensity in the right precentral gyrus. This finding was not present on the prior 2019 MRI. No hydrocephalus. Remote right cerebellar infarct. Vascular: See below. Skull and upper cervical spine: Normal marrow signal. Sinuses/Orbits: Redemonstrated mucosal edema in the right paranasal sinuses involving the right ethmoid and maxillary sinus with air-fluid level right maxillary sinus. There is diffuse soft tissue thickening in the right nasal cavity. Other: No mastoid effusions. MRA HEAD FINDINGS Anterior circulation: Bilateral intracranial ICAs, MCAs, and ACAs are  patent without proximal hemodynamically significant stenosis. The previously occluded left M3 MCA branch is probably now patent. Posterior circulation: Bilateral intradural vertebral arteries, basilar artery and bilateral posterior cerebral arteries are patent without proximal hemodynamically significant stenosis. IMPRESSION: MRI: 1. Approximately 8 mm focus of susceptibility artifact and T2 hypointensity in the right precentral gyrus, possibly representing an age-indeterminate hemorrhage that was not present in 2019. Recommend CT of the head without contrast to evaluate for hyperdense hemorrhage in this region and exclude interval/acute hemorrhage. 2. Acute posterior left MCA territory infarct, as detailed above 3. Redemonstrated mucosal edema in the right paranasal sinuses involving the right ethmoid and maxillary sinus with air-fluid level right maxillary sinus. There is diffuse soft tissue thickening in the right nasal cavity which could be due to polyp or tumor. Recommend direct visualization. MRA: No evidence of a proximal large vessel occlusion. The previously occluded left M3 MCA branch is probably now patent, although evaluation is somewhat limited with MRA. These results will be called to the ordering clinician or representative by the Radiologist Assistant, and  communication documented in the PACS or [employer]. Electronically Signed   By: Margaretha Sheffield M.D.   On: 02/09/2022 15:43   MR ANGIO HEAD WO CONTRAST  Result Date: 02/09/2022 CLINICAL DATA:  Stroke, follow up get with MRI brain post-TNK timed 24 hours at APPROX.6 PM on 8/24; Stroke, follow up 24 hour post TNK, obtain at about 6 pm on 8/24 please EXAM: MRI HEAD WITHOUT CONTRAST MRA HEAD WITHOUT CONTRAST TECHNIQUE: Multiplanar, multiecho pulse sequences of the brain and surrounding structures were obtained without intravenous contrast. Angiographic images of the Circle of Willis were obtained using MRA technique without intravenous contrast. COMPARISON:  CTA 02/08/2022.  MRI May 10, 2018. FINDINGS: MRI HEAD FINDINGS Brain: Confluent acute infarct involving the left parietal and left temporal lobe as well as a small portion of the posterior left insula. Multiple additional small acute infarcts in the posterior left frontal lobe and more superior left parietal lobe. Associated edema with regional mass effect. No midline shift. Additional scattered T2/FLAIR hyperintensities in the white matter, nonspecific but compatible with chronic microvascular ischemic disease. Approximately 8 mm focus of susceptibility artifact and T2 hypointensity in the right precentral gyrus. This finding was not present on the prior 2019 MRI. No hydrocephalus. Remote right cerebellar infarct. Vascular: See below. Skull and upper cervical spine: Normal marrow signal. Sinuses/Orbits: Redemonstrated mucosal edema in the right paranasal sinuses involving the right ethmoid and maxillary sinus with air-fluid level right maxillary sinus. There is diffuse soft tissue thickening in the right nasal cavity. Other: No mastoid effusions. MRA HEAD FINDINGS Anterior circulation: Bilateral intracranial ICAs, MCAs, and ACAs are patent without proximal hemodynamically significant stenosis. The previously occluded left M3 MCA branch is probably  now patent. Posterior circulation: Bilateral intradural vertebral arteries, basilar artery and bilateral posterior cerebral arteries are patent without proximal hemodynamically significant stenosis. IMPRESSION: MRI: 1. Approximately 8 mm focus of susceptibility artifact and T2 hypointensity in the right precentral gyrus, possibly representing an age-indeterminate hemorrhage that was not present in 2019. Recommend CT of the head without contrast to evaluate for hyperdense hemorrhage in this region and exclude interval/acute hemorrhage. 2. Acute posterior left MCA territory infarct, as detailed above 3. Redemonstrated mucosal edema in the right paranasal sinuses involving the right ethmoid and maxillary sinus with air-fluid level right maxillary sinus. There is diffuse soft tissue thickening in the right nasal cavity which could be due to polyp or tumor. Recommend direct visualization. MRA:  No evidence of a proximal large vessel occlusion. The previously occluded left M3 MCA branch is probably now patent, although evaluation is somewhat limited with MRA. These results will be called to the ordering clinician or representative by the Radiologist Assistant, and communication documented in the PACS or [employer]. Electronically Signed   By: Margaretha Sheffield M.D.   On: 02/09/2022 15:43   ECHOCARDIOGRAM COMPLETE  Result Date: 02/09/2022    ECHOCARDIOGRAM REPORT   Patient Name:   Linda [name] Date of Exam: 02/09/2022 Medical Rec #:  [id number]     Height:       63.0 in Accession #:    [id number]    Weight:       172.0 lb Date of Birth:  1933-11-04     BSA:          1.813 m Patient Age:    64 years      BP:           108/63 mmHg Patient Gender: F             HR:           70 bpm. Exam Location:  Inpatient Procedure: 2D Echo, Cardiac Doppler and Color Doppler Indications:    Stroke  History:        Patient has prior history of Echocardiogram examinations, most                 recent 09/29/2020. CHF, Stroke and TIA,  Arrythmias:PVC; Risk                 Factors:Hypertension and Dyslipidemia.                  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  Sonographer:    Memory Argue Referring Phys: [id number] Gage XU IMPRESSIONS  1. Left ventricular ejection fraction, by estimation, is 50 to 55%. The left ventricle has low normal function. The left ventricle has no regional wall motion abnormalities. The left ventricular internal cavity size was severely dilated. There is severe  focal thickening of the basal septal segment measuring 1.7cm. The rest of the LV segments demonstrate mild left ventricular hypertrophy. Left ventricular diastolic parameters are indeterminate.  2. Right ventricular systolic function is normal. The right ventricular size is mildly enlarged. PASP is 43mHg + RAP.  3. Left atrial size was severely dilated.  4. Right atrial size was severely dilated.  5. The mitral valve has been repaired/replaced. There is a Mitra-Clip present in the mitral position. The mean gradient is 779mg at HR 87bpm. There is moderate mitral regurgitation.  6. Tricuspid valve regurgitation is moderate.  7. The aortic valve is tricuspid. Aortic valve regurgitation is not visualized. Aortic valve sclerosis is present, with no evidence of aortic valve stenosis. Comparison(s): No prior TTE in our system. Compared to prior TTE from OSH, the EF appears to be 50-55% (previously 45-50%), the MR appears slightly improved to moderate (previously severe), TR appears better at moderate (previously severe), PASP remains elevated although RAP not obtained on current study as patient became combative. FINDINGS  Left Ventricle: Left ventricular ejection fraction, by estimation, is 50 to 55%. The left ventricle has low normal function. The left ventricle has no regional wall motion abnormalities. The left ventricular internal cavity size was severely dilated. There is severe focal thickening of the basal septal  segment  measuring 1.7cm. The rest of the LV segments demonstrate mild left ventricular hypertrophy. Left ventricular diastolic parameters are indeterminate. Right Ventricle: The right ventricular size is mildly enlarged. No increase in right ventricular wall thickness. Right ventricular systolic function is normal. Left Atrium: Left atrial size was severely dilated. Right Atrium: Right atrial size was severely dilated. Pericardium: There is no evidence of pericardial effusion. Mitral Valve: 87bpm. The mitral valve has been repaired/replaced. Moderate mitral valve regurgitation. There is a Mitra-Clip present in the mitral position. MV peak gradient, 14.6 mmHg. The mean mitral valve gradient is 7.0 mmHg. Tricuspid Valve: The tricuspid valve is normal in structure. Tricuspid valve regurgitation is moderate. Aortic Valve: The aortic valve is tricuspid. Aortic valve regurgitation is not visualized. Aortic valve sclerosis is present, with no evidence of aortic valve stenosis. Aortic valve mean gradient measures 4.0 mmHg. Aortic valve peak gradient measures 8.1  mmHg. Aortic valve area, by VTI measures 2.23 cm. Pulmonic Valve: The pulmonic valve was normal in structure. Pulmonic valve regurgitation is trivial. Aorta: The aortic root is normal in size and structure. IAS/Shunts: There is right bowing of the interatrial septum, suggestive of elevated left atrial pressure. The atrial septum is grossly normal.  LEFT VENTRICLE PLAX 2D LVIDd:         6.40 cm LVIDs:         4.10 cm LV PW:         1.10 cm LV IVS:        1.00 cm LVOT diam:     2.10 cm LV SV:         50 LV SV Index:   27 LVOT Area:     3.46 cm  LV Volumes (MOD) LV vol d, MOD A4C: 137.0 ml LV vol s, MOD A4C: 67.8 ml LV SV MOD A4C:     137.0 ml RIGHT VENTRICLE TAPSE (M-mode): 1.9 cm LEFT ATRIUM         Index LA diam:    5.20 cm 2.87 cm/m  AORTIC VALVE AV Area (Vmax):    2.31 cm AV Area (Vmean):   2.23 cm AV Area (VTI):     2.23 cm AV Vmax:           142.00 cm/s  AV Vmean:          93.400 cm/s AV VTI:            0.222 m AV Peak Grad:      8.1 mmHg AV Mean Grad:      4.0 mmHg LVOT Vmax:         94.70 cm/s LVOT Vmean:        60.200 cm/s LVOT VTI:          0.143 m LVOT/AV VTI ratio: 0.64  AORTA Ao Root diam: 3.10 cm MITRAL VALVE                 TRICUSPID VALVE MV Area VTI:  1.26 cm       TR Peak grad:   46.0 mmHg MV Peak grad: 14.6 mmHg      TR Vmax:        339.00 cm/s MV Mean grad: 7.0 mmHg MV Vmax:      1.91 m/s       SHUNTS MV Vmean:     135.0 cm/s     Systemic VTI:  0.14 m MR Peak grad:   121.4 mmHg   Systemic Diam: 2.10 cm MR Mean grad:   74.0 mmHg MR  Vmax:        551.00 cm/s MR Vmean:       396.0 cm/s MR PISA:        0.57 cm MR PISA Radius: 0.30 cm Gwyndolyn Kaufman MD Electronically signed by Gwyndolyn Kaufman MD Signature Date/Time: 02/09/2022/1:20:58 PM    Final    DG Abd Portable 1V  Result Date: 02/09/2022 CLINICAL DATA:  Orogastric tube placement EXAM: PORTABLE ABDOMEN - 1 VIEW COMPARISON:  None Available. FINDINGS: Orogastric tube tip overlies the expected mid to distal body of the stomach. Normal abdominal gas pattern. Cholecystectomy clips seen in the right upper quadrant. Contrast is seen within the renal collecting system bilaterally. No hydronephrosis. IMPRESSION: Orogastric tube tip within the stomach. Electronically Signed   By: Fidela Salisbury M.D.   On: 02/09/2022 01:15   Portable Chest x-ray  Result Date: 02/08/2022 CLINICAL DATA:  ET tube evaluation EXAM: PORTABLE CHEST 1 VIEW COMPARISON:  02/05/2022 FINDINGS: Endotracheal tube tip is 11 mm above the carina. Cardiomegaly. No confluent airspace opacities or effusions. Mild vascular congestion. Elevation of the right hemidiaphragm, stable. IMPRESSION: Endotracheal tube 11 mm above the carina. Cardiomegaly, vascular congestion. Electronically Signed   By: Rolm Baptise M.D.   On: 02/08/2022 23:23   CT ANGIO HEAD NECK W WO CM W PERF (CODE STROKE)  Result Date: 02/08/2022 CLINICAL DATA:  Acute  neuro deficit. Right-sided droop. Abnormal speech EXAM: CT ANGIOGRAPHY HEAD AND NECK CT PERFUSION BRAIN TECHNIQUE: Multidetector CT imaging of the head and neck was performed using the standard protocol during bolus administration of intravenous contrast. Multiplanar CT image reconstructions and MIPs were obtained to evaluate the vascular anatomy. Carotid stenosis measurements (when applicable) are obtained utilizing NASCET criteria, using the distal internal carotid diameter as the denominator. Multiphase CT imaging of the brain was performed following IV bolus contrast injection. Subsequent parametric perfusion maps were calculated using RAPID software. RADIATION DOSE REDUCTION: This exam was performed according to the departmental dose-optimization program which includes automated exposure control, adjustment of the mA and/or kV according to patient size and/or use of iterative reconstruction technique. CONTRAST:  137m OMNIPAQUE IOHEXOL 350 MG/ML SOLN COMPARISON:  CT head 02/08/2022 FINDINGS: CTA NECK FINDINGS Aortic arch: Mild atherosclerotic calcification aortic arch. Bovine branching pattern. Proximal great vessels widely patent. Right carotid system: Right carotid widely patent. Minimal atherosclerotic disease right carotid bifurcation. Left carotid system: Left carotid widely patent. Negative for stenosis. Vertebral arteries: Both vertebral arteries patent to the skull base without stenosis. Skeleton: No acute skeletal abnormality. Other neck: Negative for mass or adenopathy. Mucosal edema and air-fluid level right maxillary sinus. Mucosal edema right ethmoid sinus. Soft tissue thickening throughout the right nasal cavity which could be due to polyp or tumor. Direct visualization recommended. Upper chest: Lung apices clear bilaterally. Review of the MIP images confirms the above findings CTA HEAD FINDINGS Anterior circulation: Internal carotid artery widely patent through the skull base and cavernous  segment without stenosis. Left M1 widely patent. Occlusion of a left M3 branch involving the inferior division of the left MCA. Superior division left MCA patent Anterior cerebral arteries patent bilaterally. Right middle cerebral artery widely patent without stenosis. Posterior circulation: Both vertebral arteries patent to the basilar. PICA patent bilaterally. Basilar widely patent. Superior cerebellar and posterior cerebral arteries patent bilaterally without stenosis or large vessel occlusion. Venous sinuses: Normal venous enhancement Anatomic variants: None Review of the MIP images confirms the above findings CT Brain Perfusion Findings: ASPECTS: 10 CBF (<30%) Volume: 843m  This is in  the left parietal lobe. Perfusion (Tmax>6.0s) volume: 175m. Most of the area of delayed perfusion is in the left posterior temporal and parietal lobe however there is a significant amount also in the right hemisphere involving the right frontal lobe and right parietal lobe. This may be artifact on the right. Mismatch Volume: 1360m However approximately 8052mf this is in the left hemisphere. Infarction Location:Left parietal lobe IMPRESSION: 1. Occlusion left M3 branch supplying the left parietal lobe. 2. CT perfusion reveals 8 mL of core infarct in the left parietal lobe with surrounding penumbra. Estimated 80 mL of penumbra in the left hemisphere. CT perfusion also shows delayed perfusion in the right frontal and parietal lobe which could be due to artifact as there is no associated stenosis to this area. 3. No significant carotid or vertebral artery stenosis in the neck 4. These results were called by telephone at the time of interpretation on 02/08/2022 at 7:00 pm to provider ER[facility]Loc[facility] Centerwho verbally acknowledged these results. 5. Asymmetric mucosal edema in the right paranasal sinuses involving the right ethmoid and maxillary sinus with air-fluid level right maxillary sinus. There is diffuse soft tissue thickening in the  right nasal cavity which could be due to polyp or tumor. Recommend direct visualization. Electronically Signed   By: ChaFranchot GalloD.   On: 02/08/2022 19:09   CT HEAD CODE STROKE WO CONTRAST`  Result Date: 02/08/2022 CLINICAL DATA:  Code stroke. Acute neuro deficit. Right facial droop and arm weakness. EXAM: CT HEAD WITHOUT CONTRAST TECHNIQUE: Contiguous axial images were obtained from the base of the skull through the vertex without intravenous contrast. RADIATION DOSE REDUCTION: This exam was performed according to the departmental dose-optimization program which includes automated exposure control, adjustment of the mA and/or kV according to patient size and/or use of iterative reconstruction technique. COMPARISON:  CT head 05/10/2018 FINDINGS: Brain: Mild atrophy and mild white matter hypodensity. Negative for acute infarct, hemorrhage, mass. Vascular: Negative for hyperdense vessel Skull: Negative Sinuses/Orbits: Mucosal edema in the paranasal sinuses. Air-fluid level right maxillary and right sphenoid sinus. Bilateral cataract extraction Other: None ASPECTS ([place] Program Early CT Score) - Ganglionic level infarction (caudate, lentiform nuclei, internal capsule, insula, M1-M3 cortex): 7 - Supraganglionic infarction (M4-M6 cortex): 3 Total score (0-10 with 10 being normal): 10 IMPRESSION: 1. Atrophy and chronic microvascular ischemic change in the white matter. No acute abnormality. 2. ASPECTS is 10 3. Code stroke imaging results were communicated on 02/08/2022 at 6:36 pm to provider [place] via amion app Electronically Signed   By: ChaFranchot GalloD.   On: 02/08/2022 18:38   DG Chest Port 1 View  Result Date: 02/05/2022 CLINICAL DATA:  Shortness of breath EXAM: PORTABLE CHEST 1 VIEW COMPARISON:  07/13/2021 FINDINGS: Cardiomegaly, vascular congestion. Elevation of the right hemidiaphragm, stable. Patchy right mid lung and lower lobe airspace opacity. No focal opacity on the left. No  effusions or acute bony abnormality. IMPRESSION: Patchy right mid and lower lung opacities could reflect atelectasis or pneumonia. Cardiomegaly, vascular congestion. Electronically Signed   By: KevRolm BaptiseD.   On: 02/05/2022 01:29       HISTORY OF PRESENT ILLNESS Patient with a history of PAF on Eliquis (which was stopped 4 days prior to admission due to severe epistaxis), HFrEF, HLD, HTN, parathyroid  disease, mitral regurgitation s/p MitraClip, TIA and cataracts s/p extraction presented with sudden onset right facial droop, right arm weakness, global aphasia and right visual field cut.   HOSPITAL COURSE Patient was  found to have a left M3 occlusion.  She was given TNK and taken to IR for mechanical thrombectomy.  This was successful and TICI3 revascularization was achieved.  She later was found to have a small right frontal hematoma, likely HT post TNK.  She has had some problems with epistaxis, but this is under control.  Patient will eventually need to restart Eliquis for atrial fibrillation and is on daily aspirin now.  She has been extubated and is now ready for transfer to CIR.    Stroke:  left MCA M3 occlusion s/p TNK and TICI3 revascularization Etiology:  suspect embolic with Afib not on AC due to recent epistaxis  Code Stroke CT head Atrophy and chronic microvascular ischemic change in the white matter. No acute abnormality. ASPECTS 10.    CTA head & neck  Occlusion left M3 branch supplying the left parietal lobe. No significant carotid or vertebral artery stenosis in the neck. CT perfusion 8 mL of core infarct in the left parietal lobe with surrounding penumbra. Estimated 80 mL of penumbra in the left hemisphere. CT perfusion also shows delayed perfusion in the right frontal and parietal lobe which could be due to artifact as there is no associated stenosis to this area Cerebral angio Occluded M3 branch of the inferior division of the left middle cerebral artery Post IR CT no evidence of  intracranial hemorrhage.  A small hyperattenuation noted in the posterior insular region probably representing contrast stain.  No gross hemorrhage evident. MRI Approximately 8 mm focus of susceptibility artifact and T2 hypointensity in the right precentral gyrus, possibly representing an age-indeterminate hemorrhage, Acute posterior left MCA territory infarct MRA  No LVO, M3 is likely patent REPEAT CT HEAD- Small acute intra-axial hemorrhage in the posterior Right Centrum, Left MCA territory infarcts  2D Echo EF 50-55%, dilated left ventricle, MitraClip present in mitral valve LDL 77 HgbA1c 6.6 VTE prophylaxis - SCDs Eliquis (apixaban) daily prior to admission (which was d/c in setting of nosebleed), now on No antithrombotic.  Started aspirin 81 mg now and anticoagulation in 5 to 7 days Therapy recommendations:  CIR   ICH: right frontal small hematoma, likely hemorrhagic transformation status post TNK MRI showed small right frontal hematoma at 24 hours of TNK CT 8/25 confirmed small right frontal hematoma, stable Hold off antithrombotics for now BP goal less than 160   Epistaxis Packed by ENT Dr. Virgia Land at [facility] Orders to remove packing today Afrin and saline ordered from home meds originally ordered by outpatient ENT Hold off on resume [place] for 5-7 days   Atrial fibrillation Patient takes Eliquis for atrial fibrillation at home Eliquis was on hold for four days due to severe epistaxis, large stroke and small hemorrhagic transformation Hold off antithrombotic for now, continue aspirin 81 mg daily   Hypertension Home meds:  losartan 25, metoprolol 12.5, furosemide 20  Off cleviprex Goal SBP less than 160 due to hemorrhage on MRI Long-term BP goal normotensive   Hypertension Home meds:  losartan 25 mg daily Stable BP goal less than 160 Long-term BP goal normotensive   Hyperlipidemia Home meds:  none LDL 77, goal < 70 No statin due to previous intolerance Add Zetia 10 mg  daily Continue zetia at discharge   Diabetes type II Controlled Home meds:  farxiga HgbA1c 6.6, goal < 7.0 CBGs SSI    Dysphagia Post extubation dysphagia SLP passed - dysphagia 1 with nectar thick    Other Stroke Risk Factors Advanced Age >/= 7  Congestive  heart failure   Other Active Problems Depression Start fluoxetine '10mg'$   Palliative care consult per family request- husband died 8 years ago and patient has been more depressed since then  DISCHARGE EXAM Blood pressure (!) 155/68, pulse (!) 55, temperature 97.8 F (36.6 C), temperature source Axillary, resp. rate (!) 24, weight 82.5 kg, SpO2 93 %.  Constitutional: Appears well-developed and well-nourished pleasant elderly Caucasian lady.   Respiratory: Effort normal, non-labored breathing   Neuro: Mental Status: Awake, alert. Globally aphasic- improving. Able to mimic and inconsistently follow simple one step commands. Starting to speak more words Cranial Nerves: II: Pupils are equal, round, and reactive to light.   III,IV, VI: EOMI without ptosis.  V: Facial sensation is symmetric to light touch VII: Mild right facial droop VIII: Hearing intact bilaterally XI: Head is midline No tongue deviation Motor: Tone is normal. Bulk is normal.  Moves all extremities antigravity but will not cooperate for detailed muscle testing Sensory: Withdraws to pain in all extremities, moves upper extremities purposefully Cerebellar: Unable to assess at this time   Discharge Diet      Diet   DIET - DYS 1 Room service appropriate? Yes with Assist; Fluid consistency: Nectar Thick   liquids  DISCHARGE PLAN Disposition:  Transfer to [place] for ongoing PT, OT and ST aspirin 81 mg daily for secondary stroke prevention for 5-7 days then restart Eliquis Recommend ongoing stroke risk factor control by Primary Care Physician at time of discharge from inpatient rehabilitation. Follow-up PCP Crecencio Mc, MD in 2  weeks following discharge from rehab. Follow-up in [facility] in 8 weeks following discharge from rehab, office to schedule an appointment.   35 minutes were spent preparing discharge.  [place] , MSN, AGACNP-BC [facility] See Amion for schedule and pager information 02/13/2022 11:10 AM   I have personally obtained history,examined this patient, reviewed notes, independently viewed imaging studies, participated in medical decision making and plan of care.ROS completed by me personally and pertinent positives fully documented  I have made any additions or clarifications directly to the above note. Agree with note above.  Continue aspirin for now but resume anticoagulation with Eliquis in 5 to 7 days if there is no more epistaxis or hemoptysis  Antony Contras, MD Medical Director [facility] Pager: [phone] 02/13/2022 2:31 PM

## 2022-02-13 NOTE — Plan of Care (Signed)
  Problem: Education: Goal: Ability to describe self-care measures that may prevent or decrease complications (Diabetes Survival Skills Education) will improve Outcome: Progressing   Problem: Coping: Goal: Ability to adjust to condition or change in health will improve Outcome: Progressing   Problem: Nutritional: Goal: Maintenance of adequate nutrition will improve Outcome: Progressing   Problem: Skin Integrity: Goal: Risk for impaired skin integrity will decrease Outcome: Progressing   Problem: Tissue Perfusion: Goal: Adequacy of tissue perfusion will improve Outcome: Progressing   Problem: Cardiovascular: Goal: Ability to achieve and maintain adequate cardiovascular perfusion will improve Outcome: Progressing Goal: Vascular access site(s) Level 0-1 will be maintained Outcome: Progressing   Problem: Health Behavior/Discharge Planning: Goal: Ability to safely manage health-related needs after discharge will improve Outcome: Progressing   Problem: Activity: Goal: Ability to tolerate increased activity will improve Outcome: Progressing   Problem: Respiratory: Goal: Ability to maintain a clear airway and adequate ventilation will improve Outcome: Progressing   Problem: Role Relationship: Goal: Method of communication will improve Outcome: Progressing

## 2022-02-13 NOTE — Progress Notes (Signed)
Speech Language Pathology Treatment: Dysphagia;Cognitive-Linquistic  Patient Details Name: Linda [name] MRN: [id number] DOB: 07/06/1933 Today's Date: 02/13/2022 Time: 5681-2751 SLP Time Calculation (min) (ACUTE ONLY): 26 min  Assessment / Plan / Recommendation Clinical Impression  Pt sleepy this am, but fully alert by 11. Pt perseverating on going home to [place] with paraphasias and agrammatism. SLP able to encourage clearer expression of wants and needs with improved word finding and language structure by following pts lead on topic. Pt self corrected x3 and increased phrase length x3. Pt did attend to pictures but paraphasias were severe and pt unable to identify animals in a large set. Pt uses gestures very well to improve listener comprehension, points to items she wants with semantic paraphasias "I want white" means she wants the (white) cup.  Pt now tolerating thin liquids without signs of aspiration. Pt would not accept a graham cracker, but given adequate arousal and no sign of focal oral weakness, expect pt to tolerate solids well. Will upgrade to regular diet with thin liquids to allow daughter to make preferred food choice as pt has been refusing otherwise. Recommend AIR at d/c.   HPI HPI: Linda [name] is an 88 y.o. female SNF resident who presented to the [facility] ED via EMS as a Code Stroke after sudden onset of right facial droop, right arm weakness, global aphasia and right visual field cut. s. CT head revealed no acute hypodensity with ASPECTS of 10. TNK was administered and pt was transferred to [facility] for mechanical thrombectomy on 8/24. Extubated 8/25.  PMHx of PAF with Eliquis discontinued 4 days PTA due to severe nosebleed, CHF, HLD, HTN, parathyroid disease, prediabetes, TIAs, and cataract extractions.      SLP Plan  Continue with current plan of care      Recommendations for follow up therapy are one component of a multi-disciplinary discharge planning process, led by the  attending physician.  Recommendations may be updated based on patient status, additional functional criteria and insurance authorization.    Recommendations  Diet recommendations: Regular;Thin liquid                Follow Up Recommendations: Acute inpatient rehab (3hours/day) Plan: Continue with current plan of care           Maika Mcelveen, Katherene Ponto  02/13/2022, 11:34 AM

## 2022-02-13 NOTE — PMR Pre-admission (Signed)
PMR Admission Coordinator Pre-Admission Assessment  Patient: Linda [name] is an 88 y.o., female MRN: [id number] DOB: 04/22/1934 Height:   Weight: 82.5 kg  Insurance Information HMO:     PPO:      PCP:      IPA:      80/20: yes     OTHER:  PRIMARY: Medicare A & B      Policy#: [id number]      Subscriber: patient CM Name:       Phone#:      Fax#:  Pre-Cert#:       Employer:  Benefits:  Phone #: verified eligibility via [place] on 02/12/22     Name:  Eff. Date: Part A & B effective 10/17/00     Deduct: $1,600      Out of Pocket Max: NA      Life Max: NA CIR: 100% coverage      SNF: 100% coverage days 1-20, 80% coverage days 21-100 Outpatient: 80% coverage     Co-Pay: 20% Home Health: 100% coverage      Co-Pay:  DME: 80% coverage     Co-Pay: 20% Providers: pt's choice SECONDARY: [employer] Medicare Supplement     \ Policy#: [id number]     Phone#: [phone]  Financial Counselor:       Phone#:   The "Data Collection Information Summary" for patients in Inpatient Rehabilitation Facilities with attached "Privacy Act [place] Records" was provided and verbally reviewed with: Patient and Family  Emergency Contact Information Contact Information     Name Relation Home Work Taylor   [phone]   Russell,Jennifer Daughter [phone]  [phone]   Berle Mull [phone]         Current Medical History  Patient Admitting Diagnosis: CVA History of Present Illness: Pt is an 88 year old female with medical hx significant for: PAF with Eliquis discontinued 4 days prior d/t nosebleed, CHF, HLD, HTN, parathyroid disease, prediabetes, TIAs, cataract extractions. Pt presented to [facility] on 02/08/22 as a Code Stroke after sudden onset of right facial droop, right arm weakness, global aphasia, and right visual field cut. CT head showed no acute abnormality. CTA head and neck showed occlusion left M3 branch supplying left parietal  lobe. CTP revealed 86m of core infarct in left parietal lobe with surrounding penumbra. TNK administered. [facility]. Pt underwent endovascular revascularization by Dr. [place] Dr. DEstanislado Pandyon 02/08/22. MRI brain on 8/24 showed small right frontal hematoma at 24 hours of TNK. CT head on 8/25 confirmed hematoma. Pt intubated 8/23-8/25. Palliative consulted and recommended reconsultation after acute care discharge. Therapy evaluations completed and CIR recommended d/t pt's deficits in functional mobility, inability to perform ADLs independently, aphasia, and dysphagia. Complete NIHSS TOTAL: 7  [facility].  Past Medical History  Past Medical History:  Diagnosis Date   Anxiety    Arrhythmia    paroxysmal atrial fibrillation   CHF (congestive heart failure) (HCC)    Colon polyp    Diverticulitis    Dysrhythmia    GERD (gastroesophageal reflux disease)    Hard of hearing    Hoarseness    Hyperlipidemia    Hypertension    Kidney stone    Motion sickness    Parathyroid disease (HCC)    Parathyroidectomy    PONV (postoperative nausea and vomiting)  Pre-diabetes    PVC (premature ventricular contraction)    Skin cancer    Squamous cell carcinoma of skin 07/19/2021   right lower pretibia lateral, EDC   Squamous cell carcinoma of skin 10/13/2014   R ant neck - KA pattern   Squamous cell carcinoma of skin 09/15/2021   Left dorsal hand, shaved down to fat at time of biopsy so should already be removed.  Will observe for recurrence.   Stroke [facility])    TIA (transient ischemic attack) 04/2018   no deficitis   UTI (lower urinary tract infection)    Varicose veins of both lower extremities    Wears hearing aid in both ears     Has the patient had major surgery during 100 days prior to admission? Yes  Family History   family  history includes Breast cancer in her paternal grandmother; Heart disease in her brother; Skin cancer in her father; Stroke in her mother.  Current Medications  Current Facility-Administered Medications:    acetaminophen (TYLENOL) tablet 650 mg, 650 mg, Oral, Q4H PRN, 650 mg at 02/11/22 1022 **OR** acetaminophen (TYLENOL) 160 MG/5ML solution 650 mg, 650 mg, Per Tube, Q4H PRN, 650 mg at 02/10/22 0909 **OR** acetaminophen (TYLENOL) suppository 650 mg, 650 mg, Rectal, Q4H PRN, Bhagat, Srishti L, MD   aspirin chewable tablet 81 mg, 81 mg, Oral, Daily, de La Torre, [place]ie E, NP, 81 mg at 02/13/22 1008   Chlorhexidine Gluconate Cloth 2 % PADS 6 each, 6 each, Topical, Daily, Deveshwar, Sanjeev, MD, 6 each at 02/13/22 1044   docusate sodium (COLACE) capsule 100 mg, 100 mg, Oral, BID, Leonie Man, Pramod S, MD, 100 mg at 02/13/22 1051   ezetimibe (ZETIA) tablet 10 mg, 10 mg, Oral, Daily, Leonie Man, Pramod S, MD, 10 mg at 02/13/22 1009   feeding supplement (ENSURE ENLIVE / ENSURE PLUS) liquid 237 mL, 237 mL, Oral, TID BM, Harris, Whitney D, NP, 237 mL at 02/12/22 1351   FLUoxetine (PROZAC) capsule 10 mg, 10 mg, Oral, Daily, Shafer, Devon, NP, 10 mg at 02/12/22 1132   furosemide (LASIX) injection 40 mg, 40 mg, Intravenous, Once, Agarwala, Ravi, MD   furosemide (LASIX) tablet 20 mg, 20 mg, Oral, Daily, Shafer, Devon, NP, 20 mg at 02/13/22 1008   hydrALAZINE (APRESOLINE) injection 10 mg, 10 mg, Intravenous, Q6H PRN, Leonie Man, Pramod S, MD   insulin aspart (novoLOG) injection 0-9 Units, 0-9 Units, Subcutaneous, Q4H, Bhagat, Srishti L, MD, 1 Units at 02/13/22 0800   loratadine (CLARITIN) tablet 10 mg, 10 mg, Oral, Daily, Shafer, Devon, NP, 10 mg at 02/13/22 1009   losartan (COZAAR) tablet 25 mg, 25 mg, Oral, Daily, Leonie Man, Pramod S, MD, 25 mg at 02/13/22 1008   metoprolol tartrate (LOPRESSOR) tablet 12.5 mg, 12.5 mg, Oral, BID, Spero Geralds, MD, 12.5 mg at 02/13/22 1009   Oral care mouth rinse, 15 mL, Mouth Rinse, 4 times  per day, Spero Geralds, MD, 15 mL at 02/12/22 2107   Oral care mouth rinse, 15 mL, Mouth Rinse, PRN, Spero Geralds, MD   oxymetazoline (AFRIN) 0.05 % nasal spray 1 spray, 1 spray, Each Nare, BID, Ogan, [place]or[place]kwo U, MD, 1 spray at 02/12/22 2107   pantoprazole (PROTONIX) EC tablet 40 mg, 40 mg, Oral, Q1200, Garvin Fila, MD, 40 mg at 02/12/22 1133   potassium chloride SA (KLOR-CON M) CR tablet 20 mEq, 20 mEq, Oral, Daily, Shafer, Devon, NP, 20 mEq at 02/13/22 1008   saline (AYR) nasal gel with aloe 1 Application, 1  Application, Each Nare, Q4H PRN, Janine Ores, NP   senna-docusate (Senokot-S) tablet 1 tablet, 1 tablet, Oral, QHS PRN, Bhagat, Srishti L, MD   traZODone (DESYREL) tablet 50 mg, 50 mg, Oral, QHS PRN, Janine Ores, NP, 50 mg at 02/12/22 1844  Patients Current Diet:  Diet Order             Diet regular Room service appropriate? Yes; Fluid consistency: Thin  Diet effective now                   Precautions / Restrictions Precautions Precautions: Fall, Other (comment) Precaution Comments: HOH Restrictions Weight Bearing Restrictions: No   Has the patient had 2 or more falls or a fall with injury in the past year? No  Prior Activity Level Community (5-7x/wk): drives; gets out into community everyday  Prior Functional Level Self Care: Did the patient need help bathing, dressing, using the toilet or eating? Independent  Indoor Mobility: Did the patient need assistance with walking from room to room (with or without device)? Independent  Stairs: Did the patient need assistance with internal or external stairs (with or without device)? Unknown (does not typically have to use stairs)  Functional Cognition: Did the patient need help planning regular tasks such as shopping or remembering to take medications? Independent  Patient Information Are you of Hispanic, Latino/a,or Spanish origin?: X. Patient unable to respond, A. No, not of Hispanic, Latino/a, or Spanish  origin What is your race?: X. Patient unable to respond, A. White Do you need or want an interpreter to communicate with a doctor or health care staff?: 9. Unable to respond  Patient's Response To:  Health Literacy and Transportation Is the patient able to respond to health literacy and transportation needs?: No Health Literacy - How often do you need to have someone help you when you read instructions, pamphlets, or other written material from your doctor or pharmacy?: Patient unable to respond In the past 12 months, has lack of transportation kept you from medical appointments or from getting medications?:  (pt not able to respond d/t aphasia) In the past 12 months, has lack of transportation kept you from meetings, work, or from getting things needed for daily living?:  (pt not able to respond d/t aphasia)  Home Assistive Devices / Equipment Home Equipment: Conservation officer, nature (2 wheels)  Prior Device Use: Indicate devices/aids used by the patient prior to current illness, exacerbation or injury? Walker (Uses device around community. Does not use an assistive device in apartment)  Current Functional Level Cognition  Overall Cognitive Status: Difficult to assess Difficult to assess due to: Impaired communication, Hard of hearing/deaf Orientation Level: Oriented to person, Oriented to place, Disoriented to time, Disoriented to situation General Comments: Global aphasia (expressive worse than receptive). Following simple commands 50% of trials. More verbal today. Difficulty with word finding (frustration noted). Hard to discern receptive language difficulties vs HOH    Extremity Assessment (includes Sensation/Coordination)  Upper Extremity Assessment: RUE deficits/detail, LUE deficits/detail RUE Deficits / Details: difficult to assess due to communication and apparent pain at R IV site. pt moving well with good strength; sensory motor impairment noted. Needs further testing RUE Coordination:  decreased fine motor, decreased gross motor LUE Deficits / Details: Seemingly WFL for tasks assessed. Pt using non-dom L hand for most tasks this session LUE Sensation: WNL  Lower Extremity Assessment: Defer to PT evaluation    ADLs  Overall ADL's : Needs assistance/impaired Eating/Feeding: Minimal assistance, Sitting Grooming: Maximal assistance, Sitting  Upper Body Bathing: Maximal assistance, Sitting Lower Body Bathing: Maximal assistance, Sit to/from stand Upper Body Dressing : Moderate assistance, Sitting Lower Body Dressing: Maximal assistance, Sit to/from stand Toilet Transfer: Minimal assistance, Stand-pivot, BSC/3in1 Toileting- Clothing Manipulation and Hygiene: Maximal assistance, Sit to/from stand Functional mobility during ADLs: Minimal assistance General ADL Comments: most assist due to impaired cognition, communication and pain. limited to SP transfer for mobility due to elevated BP    Mobility  Overal bed mobility: Needs Assistance Bed Mobility: Supine to Sit Supine to sit: HOB elevated, Min assist General bed mobility comments: +rail    Transfers  Overall transfer level: Needs assistance Equipment used: Rolling walker (2 wheels) Transfers: Sit to/from Stand, Bed to chair/wheelchair/BSC Sit to Stand: Mod assist, +2 safety/equipment Bed to/from chair/wheelchair/BSC transfer type:: Stand pivot Stand pivot transfers: Mod assist, +2 safety/equipment General transfer comment: pivot transfer bed to [facility] without AD. Sit to stand with RW for pericare.    Ambulation / Gait / Stairs / Wheelchair Mobility  Ambulation/Gait Ambulation/Gait assistance: Min assist, +2 safety/equipment Gait Distance (Feet): 3 Feet Assistive device: Rolling walker (2 wheels) Gait Pattern/deviations: Step-through pattern, Decreased stride length, Trunk flexed General Gait Details: cues for posture. Pt fatigues quickly    Posture / Balance Balance Overall balance assessment: Needs  assistance Sitting-balance support: Feet supported, No upper extremity supported Sitting balance-Leahy Scale: Fair Standing balance support: During functional activity, Bilateral upper extremity supported, Reliant on assistive device for balance Standing balance-Leahy Scale: Poor    Special needs/care consideration Oxygen 2L O2 via nasal cannula, Skin Ecchymosis: arm, hand, wrist, leg/bilateral; Wound: puncture/anterior, Bladder incontinence, External urinary catheter and Diabetic management novoLOG 0-9 units every 4 hours   Previous Home Environment (from acute therapy documentation) Living Arrangements: Alone Available Help at Discharge: Family Type of Home: Independent living facility Home Layout: One level Home Access: Financial trader: Handicapped height Bathroom Accessibility: Yes How Accessible: Accessible via walker, Accessible via wheelchair [place]: No Additional Comments: Pt has resided at [facility] since 04/2021. She was living alone in a house prior to that time.  Discharge Living Setting Plans for Discharge Living Setting: Other (Comment) (SNF v. ALF at [facility]) Type of Home at Discharge:  (SNF v. ALF) Does the patient have any problems obtaining your medications?: No  Social/Family/Support Systems Anticipated Caregiver: TBD based on progress. Tentative ALF v. SNF Discharge Plan Discussed with Primary Caregiver: Yes Is Caregiver In Agreement with Plan?: Yes Does Caregiver/Family have Issues with Lodging/Transportation while Pt is in Rehab?: No  Goals Patient/Family Goal for Rehab: Supervision-Min A:PT/OT, Min A:ST Expected length of stay: 12-14 days Pt/Family Agrees to Admission and willing to participate: Yes Program Orientation Provided & Reviewed with Pt/Caregiver Including Roles  & Responsibilities: Yes  Decrease burden of Care through IP rehab admission: NA  Possible need for SNF placement upon discharge: Pt may be placed in  SNF at her retirement community upon her return  Patient Condition: I have reviewed medical records from [facility], spoken with CM, and patient, daughter, and family member. I met with patient at the bedside for inpatient rehabilitation assessment.  Patient will benefit from ongoing PT, OT, and SLP, can actively participate in 3 hours of therapy a day 5 days of the week, and can make measurable gains during the admission.  Patient will also benefit from the coordinated team approach during an Inpatient Acute Rehabilitation admission.  The patient will receive intensive therapy as well as Rehabilitation physician, nursing, social worker, and  care management interventions.  Due to bladder management, safety, skin/wound care, disease management, medication administration, pain management, and patient education the patient requires 24 hour a day rehabilitation nursing.  The patient is currently Min-Mod A with mobility and Mod-Max A with basic ADLs.  Discharge setting and therapy post discharge at  [facility]  is anticipated.  Patient has agreed to participate in the Acute Inpatient Rehabilitation Program and will admit today.  Preadmission Screen Completed By:  Bethel Born, 02/13/2022 12:27 PM ______________________________________________________________________   Discussed status with Dr. Letta Pate on 02/13/22  at 12:27 PM and received approval for admission today.  Admission Coordinator:  Bethel Born, CCC-SLP, time 12:27 PM/Date 02/13/22    Assessment/Plan: Diagnosis:left post branch MCA infarct Does the need for close, 24 hr/day Medical supervision in concert with the patient's rehab needs make it unreasonable for this patient to be served in a less intensive setting? Yes Co-Morbidities requiring supervision/potential complications: right hemiparesis, , severe fluent aphasia, PAF, CHF Due to bladder management, bowel management, safety, skin/wound  care, disease management, medication administration, pain management, and patient education, does the patient require 24 hr/day rehab nursing? Yes Does the patient require coordinated care of a physician, rehab nurse, PT, OT, and SLP to address physical and functional deficits in the context of the above medical diagnosis(es)? Yes Addressing deficits in the following areas: balance, endurance, locomotion, strength, transferring, bowel/bladder control, bathing, dressing, feeding, grooming, toileting, cognition, speech, language, and psychosocial support Can the patient actively participate in an intensive therapy program of at least 3 hrs of therapy 5 days a week? Yes The potential for patient to make measurable gains while on inpatient rehab is good Anticipated functional outcomes upon discharge from inpatient rehab: min assist PT, min assist OT, min assist SLP Estimated rehab length of stay to reach the above functional goals is: 12-14d Anticipated discharge destination:  [place] ? ALF vs SNF level  10. Overall Rehab/Functional Prognosis: good   MD Signature: Charlett Blake M.D. [employer] Fellow Am Acad of Phys Med and Rehab Diplomate Am Board of Electrodiagnostic Med Fellow Am Board of Interventional Pain

## 2022-02-13 NOTE — Progress Notes (Signed)
Signed     PMR Admission Coordinator Pre-Admission Assessment   Patient: Linda [name] is an 88 y.o., female MRN: [id number] DOB: 03-31-1934 Height:   Weight: 82.5 kg   Insurance Information HMO:     PPO:      PCP:      IPA:      80/20: yes     OTHER:  PRIMARY: Medicare A & B      Policy#: [id number]      Subscriber: patient CM Name:       Phone#:      Fax#:  Pre-Cert#:       Employer:  Benefits:  Phone #: verified eligibility via [employer] on 02/12/22     Name:  Eff. Date: Part A & B effective 10/17/00     Deduct: $1,600      Out of Pocket Max: NA      Life Max: NA CIR: 100% coverage      SNF: 100% coverage days 1-20, 80% coverage days 21-100 Outpatient: 80% coverage     Co-Pay: 20% Home Health: 100% coverage      Co-Pay:  DME: 80% coverage     Co-Pay: 20% Providers: pt's choice SECONDARY: [employer] Medicare Supplement     <[id number]<\/[id number] Policy#: [id number]     Phone#: [phone]   Financial Counselor:       Phone#:    The "Data Collection Information Summary" for patients in Inpatient Rehabilitation Facilities with attached "Privacy Act [place] Records" was provided and verbally reviewed with: Patient and Family   Emergency Contact Information Contact Information       Name Relation Home Work Patterson     [phone]    Russell,Jennifer Daughter [phone]   [phone]    Berle Mull [phone]               Current Medical History  Patient Admitting Diagnosis: CVA History of Present Illness: Pt is an 88 year old female with medical hx significant for: PAF with Eliquis discontinued 4 days prior d/t nosebleed, CHF, HLD, HTN, parathyroid disease, prediabetes, TIAs, cataract extractions. Pt [facility] to [facility] on 02/08/22 as a Code Stroke after sudden onset of right facial droop, right arm weakness, global aphasia, and right visual field cut. CT head showed no acute abnormality. CTA head and neck showed occlusion left M3  branch supplying left parietal lobe. CTP revealed 51m of core infarct in left parietal lobe with surrounding penumbra. [facility]NK administered[facility]. Pt underwent endovascular revascularization by Dr. [place] Dr. DEstanislado Pandyon 02/08/22. MRI brain on 8/24 showed small right frontal hematoma at 24 hours of TNK. CT head on 8/25 confirmed hematoma. Pt intubated 8/23-8/25. Palliative consulted and recommended reconsultation after acute care discharge. Therapy evaluations completed and CIR recommended d/t pt's deficits in functional mobility, inability to perform ADLs independently, aphasia, and dysphagia. Complete NIHSS TOTAL: 7   [facility] and physician.   Past Medical History      Past Medical History:  Diagnosis Date   Anxiety     Arrhythmia      paroxysmal atrial fibrillation   CHF (congestive heart failure) (HCC)     Colon polyp     Diverticulitis     Dysrhythmia     GERD (gastroesophageal reflux disease)     Hard of hearing  Hoarseness     Hyperlipidemia     Hypertension     Kidney stone     Motion sickness     Parathyroid disease (HCC)      Parathyroidectomy    PONV (postoperative nausea and vomiting)     Pre-diabetes     PVC (premature ventricular contraction)     Skin cancer     Squamous cell carcinoma of skin 07/19/2021    right lower pretibia lateral, EDC   Squamous cell carcinoma of skin 10/13/2014    R ant neck - KA pattern   Squamous cell carcinoma of skin 09/15/2021    Left dorsal hand, shaved down to fat at time of biopsy so should already be removed.  Will observe for recurrence.   Stroke [facility])     TIA (transient ischemic attack) 04/2018    no deficitis   UTI (lower urinary tract infection)     Varicose veins of both lower extremities     Wears hearing aid in both ears        Has the patient had major surgery  during 100 days prior to admission? Yes   Family History   family history includes Breast cancer in her paternal grandmother; Heart disease in her brother; Skin cancer in her father; Stroke in her mother.   Current Medications   Current Facility-Administered Medications:    acetaminophen (TYLENOL) tablet 650 mg, 650 mg, Oral, Q4H PRN, 650 mg at 02/11/22 1022 **OR** acetaminophen (TYLENOL) 160 MG/5ML solution 650 mg, 650 mg, Per Tube, Q4H PRN, 650 mg at 02/10/22 0909 **OR** acetaminophen (TYLENOL) suppository 650 mg, 650 mg, Rectal, Q4H PRN, Bhagat, Srishti L, MD   aspirin chewable tablet 81 mg, 81 mg, Oral, Daily, de La Torre, Thebes E, NP, 81 mg at 02/13/22 1008   Chlorhexidine Gluconate Cloth 2 % PADS 6 each, 6 each, Topical, Daily, Deveshwar, Sanjeev, MD, 6 each at 02/13/22 1044   docusate sodium (COLACE) capsule 100 mg, 100 mg, Oral, BID, Leonie Man, Pramod S, MD, 100 mg at 02/13/22 1051   ezetimibe (ZETIA) tablet 10 mg, 10 mg, Oral, Daily, Leonie Man, Pramod S, MD, 10 mg at 02/13/22 1009   feeding supplement (ENSURE ENLIVE / ENSURE PLUS) liquid 237 mL, 237 mL, Oral, TID BM, Harris, Whitney D, NP, 237 mL at 02/12/22 1351   FLUoxetine (PROZAC) capsule 10 mg, 10 mg, Oral, Daily, Shafer, Devon, NP, 10 mg at 02/12/22 1132   furosemide (LASIX) injection 40 mg, 40 mg, Intravenous, Once, Agarwala, Ravi, MD   furosemide (LASIX) tablet 20 mg, 20 mg, Oral, Daily, Shafer, Devon, NP, 20 mg at 02/13/22 1008   hydrALAZINE (APRESOLINE) injection 10 mg, 10 mg, Intravenous, Q6H PRN, Leonie Man, Pramod S, MD   insulin aspart (novoLOG) injection 0-9 Units, 0-9 Units, Subcutaneous, Q4H, Bhagat, Srishti L, MD, 1 Units at 02/13/22 0800   loratadine (CLARITIN) tablet 10 mg, 10 mg, Oral, Daily, Shafer, Devon, NP, 10 mg at 02/13/22 1009   losartan (COZAAR) tablet 25 mg, 25 mg, Oral, Daily, Leonie Man, Pramod S, MD, 25 mg at 02/13/22 1008   metoprolol tartrate (LOPRESSOR) tablet 12.5 mg, 12.5 mg, Oral, BID, Spero Geralds, MD, 12.5 mg  at 02/13/22 1009   Oral care mouth rinse, 15 mL, Mouth Rinse, 4 times per day, Spero Geralds, MD, 15 mL at 02/12/22 2107   Oral care mouth rinse, 15 mL, Mouth Rinse, PRN, Spero Geralds, MD   oxymetazoline (AFRIN) 0.05 % nasal spray 1 spray, 1 spray, Each Nare, BID,  Frederik Pear, MD, 1 spray at 02/12/22 2107   pantoprazole (PROTONIX) EC tablet 40 mg, 40 mg, Oral, Q1200, Garvin Fila, MD, 40 mg at 02/12/22 1133   potassium chloride SA (KLOR-CON M) CR tablet 20 mEq, 20 mEq, Oral, Daily, Shafer, Devon, NP, 20 mEq at 02/13/22 1008   saline (AYR) nasal gel with aloe 1 Application, 1 Application, Each Nare, Q4H PRN, Charlean Merl, Devon, NP   senna-docusate (Senokot-S) tablet 1 tablet, 1 tablet, Oral, QHS PRN, Bhagat, Srishti L, MD   traZODone (DESYREL) tablet 50 mg, 50 mg, Oral, QHS PRN, Janine Ores, NP, 50 mg at 02/12/22 1844   Patients Current Diet:  Diet Order                  Diet regular Room service appropriate? Yes; Fluid consistency: Thin  Diet effective now                         Precautions / Restrictions Precautions Precautions: Fall, Other (comment) Precaution Comments: HOH Restrictions Weight Bearing Restrictions: No    Has the patient had 2 or more falls or a fall with injury in the past year? No   Prior Activity Level Community (5-7x/wk): drives; gets out into community everyday   Prior Functional Level Self Care: Did the patient need help bathing, dressing, using the toilet or eating? Independent   Indoor Mobility: Did the patient need assistance with walking from room to room (with or without device)? Independent   Stairs: Did the patient need assistance with internal or external stairs (with or without device)? Unknown (does not typically have to use stairs)   Functional Cognition: Did the patient need help planning regular tasks such as shopping or remembering to take medications? Independent   Patient Information Are you of Hispanic, Latino/a,or  Spanish origin?: X. Patient unable to respond, A. No, not of Hispanic, Latino/a, or Spanish origin What is your race?: X. Patient unable to respond, A. White Do you need or want an interpreter to communicate with a doctor or health care staff?: 9. Unable to respond   Patient's Response To:  Health Literacy and Transportation Is the patient able to respond to health literacy and transportation needs?: No Health Literacy - How often do you need to have someone help you when you read instructions, pamphlets, or other written material from your doctor or pharmacy?: Patient unable to respond In the past 12 months, has lack of transportation kept you from medical appointments or from getting medications?:  (pt not able to respond d/t aphasia) In the past 12 months, has lack of transportation kept you from meetings, work, or from getting things needed for daily living?:  (pt not able to respond d/t aphasia)   Home Assistive Devices / Equipment Home Equipment: Conservation officer, nature (2 wheels)   Prior Device Use: Indicate devices/aids used by the patient prior to current illness, exacerbation or injury? Walker (Uses device around community. Does not use an assistive device in apartment)   Current Functional Level Cognition   Overall Cognitive Status: Difficult to assess Difficult to assess due to: Impaired communication, Hard of hearing/deaf Orientation Level: Oriented to person, Oriented to place, Disoriented to time, Disoriented to situation General Comments: Global aphasia (expressive worse than receptive). Following simple commands 50% of trials. More verbal today. Difficulty with word finding (frustration noted). Hard to discern receptive language difficulties vs HOH    Extremity Assessment (includes Sensation/Coordination)   Upper Extremity Assessment: RUE deficits/detail,  LUE deficits/detail RUE Deficits / Details: difficult to assess due to communication and apparent pain at R IV site. pt moving  well with good strength; sensory motor impairment noted. Needs further testing RUE Coordination: decreased fine motor, decreased gross motor LUE Deficits / Details: Seemingly WFL for tasks assessed. Pt using non-dom L hand for most tasks this session LUE Sensation: WNL  Lower Extremity Assessment: Defer to PT evaluation     ADLs   Overall ADL's : Needs assistance/impaired Eating/Feeding: Minimal assistance, Sitting Grooming: Maximal assistance, Sitting Upper Body Bathing: Maximal assistance, Sitting Lower Body Bathing: Maximal assistance, Sit to/from stand Upper Body Dressing : Moderate assistance, Sitting Lower Body Dressing: Maximal assistance, Sit to/from stand Toilet Transfer: Minimal assistance, Stand-pivot, BSC/3in1 Toileting- Clothing Manipulation and Hygiene: Maximal assistance, Sit to/from stand Functional mobility during ADLs: Minimal assistance General ADL Comments: most assist due to impaired cognition, communication and pain. limited to SP transfer for mobility due to elevated BP     Mobility   Overal bed mobility: Needs Assistance Bed Mobility: Supine to Sit Supine to sit: HOB elevated, Min assist General bed mobility comments: +rail     Transfers   Overall transfer level: Needs assistance Equipment used: Rolling walker (2 wheels) Transfers: Sit to/from Stand, Bed to chair/wheelchair/BSC Sit to Stand: Mod assist, +2 safety/equipment Bed to/from chair/wheelchair/BSC transfer type:: Stand pivot Stand pivot transfers: Mod assist, +2 safety/equipment General transfer comment: pivot transfer bed to [facility] without AD. Sit to stand with RW for pericare.     Ambulation / Gait / Stairs / Wheelchair Mobility   Ambulation/Gait Ambulation/Gait assistance: Min assist, +2 safety/equipment Gait Distance (Feet): 3 Feet Assistive device: Rolling walker (2 wheels) Gait Pattern/deviations: Step-through pattern, Decreased stride length, Trunk flexed General Gait Details: cues for  posture. Pt fatigues quickly     Posture / Balance Balance Overall balance assessment: Needs assistance Sitting-balance support: Feet supported, No upper extremity supported Sitting balance-Leahy Scale: Fair Standing balance support: During functional activity, Bilateral upper extremity supported, Reliant on assistive device for balance Standing balance-Leahy Scale: Poor     Special needs/care consideration Oxygen 2L O2 via nasal cannula, Skin Ecchymosis: arm, hand, wrist, leg/bilateral; Wound: puncture/anterior, Bladder incontinence, External urinary catheter and Diabetic management novoLOG 0-9 units every 4 hours    Previous Home Environment (from acute therapy documentation) Living Arrangements: Alone Available Help at Discharge: Family Type of Home: Independent living facility Home Layout: One level Home Access: Financial trader: Handicapped height Bathroom Accessibility: Yes How Accessible: Accessible via walker, Accessible via wheelchair Bagley: No Additional Comments: Pt has resided at [place] since 04/2021. She was living alone in a house prior to that time.   Discharge Living Setting Plans for Discharge Living Setting: Other (Comment) (SNF v. ALF at [facility]) Type of Home at Discharge:  (SNF v. ALF) Does the patient have any problems obtaining your medications?: No   Social/Family/Support Systems Anticipated Caregiver: TBD based on progress. Tentative ALF v. SNF Discharge Plan Discussed with Primary Caregiver: Yes Is Caregiver In Agreement with Plan?: Yes Does Caregiver/Family have Issues with Lodging/Transportation while Pt is in Rehab?: No   Goals Patient/Family Goal for Rehab: Supervision-Min A:PT/OT, Min A:ST Expected length of stay: 12-14 days Pt/Family Agrees to Admission and willing to participate: Yes Program Orientation Provided & Reviewed with Pt/Caregiver Including Roles  & Responsibilities: Yes   Decrease burden of Care  through IP rehab admission: NA   Possible need for SNF placement upon discharge: Pt may be placed in  SNF at her retirement community upon her return   Patient Condition: I have reviewed medical records from [facility], spoken with CM, and patient, daughter, and family member. I met with patient at the bedside for inpatient rehabilitation assessment.  Patient will benefit from ongoing PT, OT, and SLP, can actively participate in 3 hours of therapy a day 5 days of the week, and can make measurable gains during the admission.  Patient will also benefit from the coordinated team approach during an Inpatient Acute Rehabilitation admission.  The patient will receive intensive therapy as well as Rehabilitation physician, nursing, social worker, and care management interventions.  Due to bladder management, safety, skin/wound care, disease management, medication administration, pain management, and patient education the patient requires 24 hour a day rehabilitation nursing.  The patient is currently Min-Mod A with mobility and Mod-Max A with basic ADLs.  Discharge setting and therapy post discharge at  [facility]  is anticipated.  Patient has agreed to participate in the Acute Inpatient Rehabilitation Program and will admit today.   Preadmission Screen Completed By:  Bethel Born, 02/13/2022 12:27 PM ______________________________________________________________________   Discussed status with Dr. Letta Pate on 02/13/22  at 12:27 PM and received approval for admission today.   Admission Coordinator:  Bethel Born, CCC-SLP, time 12:27 PM/Date 02/13/22     Assessment/Plan: Diagnosis:left post branch MCA infarct Does the need for close, 24 hr/day Medical supervision in concert with the patient's rehab needs make it unreasonable for this patient to be served in a less intensive setting? Yes Co-Morbidities requiring supervision/potential complications: right  hemiparesis, , severe fluent aphasia, PAF, CHF Due to bladder management, bowel management, safety, skin/wound care, disease management, medication administration, pain management, and patient education, does the patient require 24 hr/day rehab nursing? Yes Does the patient require coordinated care of a physician, rehab nurse, PT, OT, and SLP to address physical and functional deficits in the context of the above medical diagnosis(es)? Yes Addressing deficits in the following areas: balance, endurance, locomotion, strength, transferring, bowel/bladder control, bathing, dressing, feeding, grooming, toileting, cognition, speech, language, and psychosocial support Can the patient actively participate in an intensive therapy program of at least 3 hrs of therapy 5 days a week? Yes The potential for patient to make measurable gains while on inpatient rehab is good Anticipated functional outcomes upon discharge from inpatient rehab: min assist PT, min assist OT, min assist SLP Estimated rehab length of stay to reach the above functional goals is: 12-14d Anticipated discharge destination:  [place] ? ALF vs SNF level  10. Overall Rehab/Functional Prognosis: good     MD Signature: Charlett Blake M.D. [employer] Fellow Am Acad of Phys Med and Rehab Diplomate Am Board of Electrodiagnostic Med Fellow Am Board of Interventional Pain

## 2022-02-13 NOTE — Progress Notes (Signed)
SLP Cancellation Note  Patient Details Name: Linda [name] MRN: [id number] DOB: [birth year]   Cancelled treatment:       Reason Eval/Treat Not Completed: Fatigue/lethargy limiting ability to participate. Pt sleeping up in chair, very lethargic per daughter. Requested I try back a bit later   Shlomie Romig, Katherene Ponto 02/13/2022, 9:26 AM

## 2022-02-13 NOTE — Progress Notes (Signed)
Inpatient Rehab Admissions Coordinator:  There is a bed available in CIR for pt today. Dr. Leonie Man and Ellison Hughs, NP are aware and in agreement. Pt, daughter [name], NSG, and TOC made aware.   Gayland Curry, [place], [place] Admissions Coordinator [phone]

## 2022-02-13 NOTE — H&P (Signed)
Physical Medicine and Rehabilitation Admission H&P       HPI: Linda Pierron. Forner is an 88 year old right-handed female with history of hearing loss, atrial fibrillation maintained on Eliquis, left ventricular hypertrophy, severe mitral regurgitation status post MitraClip 09/979, diastolic congestive heart failure, hypertension, TIA, hyperlipidemia, type 2 diabetes mellitus.  Per chart review patient lives in independent living facility/[facility].  She did use a rollator for long distances.  Presented 02/08/2022 to [facility] with acute onset of right facial droop, right arm weakness global aphasia and field cut.  It was noted patient's Eliquis recently discontinued due to nosebleed.  Epistaxis packed by ENT Dr. Richardson Landry at [facility].  Cranial CT scan showed no acute abnormality.  She did receive TNK.  CT angiogram head and neck occlusion left M3 branch supplying the left parietal lobe.  Patient was transferred to [facility].  CT perfusion revealed 8 mL of core infarct in left parietal lobe with surrounding penumbra.  MRA approximately 8 mm focus of susceptibility artifact and T2 hypointensity in the right precentral gyrus possibly representing an age-indeterminate hemorrhage that was not present in 2019.  Acute posterior left MCA territory infarct.  Underwent left MCA M3 occlusion with revascularization 02/12/2022 per interventional radiology.  MRI follow-up noting acute posterior left MCA territory infarct as well as right frontal hematoma likely hemorrhagic transformation from TNK.  Echocardiogram ejection fraction of 50 to 55% no wall motion abnormality.  She was cleared to begin low-dose aspirin therapy for CVA prophylaxis.  Her chronic Eliquis remains on hold.  Tolerating a regular diet.  Palliative care consulted to establish goals of care.  Therapy evaluations completed due to patient's right side weakness decreased functional mobility was admitted for a comprehensive rehab program.   Review of Systems   Constitutional:  Negative for chills and fever.  HENT:  Positive for hearing loss and nosebleeds.   Eyes:  Negative for blurred vision and double vision.  Respiratory:  Negative for cough, shortness of breath and wheezing.   Cardiovascular:  Positive for palpitations and leg swelling. Negative for chest pain.  Gastrointestinal:  Positive for constipation. Negative for heartburn, nausea and vomiting.  Genitourinary:  Negative for dysuria, flank pain and hematuria.  Musculoskeletal:  Positive for myalgias.  Skin:  Negative for rash.  Neurological:  Positive for speech change and weakness.  Psychiatric/Behavioral:  Positive for depression. The patient has insomnia.   All other systems reviewed and are negative.       Past Medical History:  Diagnosis Date   Anxiety     Arrhythmia      paroxysmal atrial fibrillation   CHF (congestive heart failure) (HCC)     Colon polyp     Diverticulitis     Dysrhythmia     GERD (gastroesophageal reflux disease)     Hard of hearing     Hoarseness     Hyperlipidemia     Hypertension     Kidney stone     Motion sickness     Parathyroid disease (HCC)      Parathyroidectomy    PONV (postoperative nausea and vomiting)     Pre-diabetes     PVC (premature ventricular contraction)     Skin cancer     Squamous cell carcinoma of skin 07/19/2021    right lower pretibia lateral, EDC   Squamous cell carcinoma of skin 10/13/2014    R ant neck - KA pattern   Squamous cell carcinoma of skin 09/15/2021    Left dorsal hand, shaved  down to fat at time of biopsy so should already be removed.  Will observe for recurrence.   Stroke [facility])     TIA (transient ischemic attack) 04/2018    no deficitis   UTI (lower urinary tract infection)     Varicose veins of both lower extremities     Wears hearing aid in both ears           Past Surgical History:  Procedure Laterality Date   ABDOMINAL HYSTERECTOMY       APPENDECTOMY       CATARACT EXTRACTION W/PHACO Left  03/05/2019    Procedure: CATARACT EXTRACTION PHACO AND INTRAOCULAR LENS PLACEMENT ([place])   0:57 15.6% 9.05;  Surgeon: Leandrew Koyanagi, MD;  Location: [place];  Service: Ophthalmology;  Laterality: Left;  Diabetic - diet cotrolled   CATARACT EXTRACTION W/PHACO Right 04/09/2019    Procedure: CATARACT EXTRACTION PHACO AND INTRAOCULAR LENS PLACEMENT (IOC) RIGHT DIABETIC 00:47.6  15.9%  7.60;  Surgeon: Leandrew Koyanagi, MD;  Location: [place];  Service: Ophthalmology;  Laterality: Right;   CHOLECYSTECTOMY   2002   ESOPHAGOGASTRODUODENOSCOPY (EGD) WITH PROPOFOL N/A 12/17/2019    Procedure: ESOPHAGOGASTRODUODENOSCOPY (EGD) WITH PROPOFOL;  Surgeon: Robert Bellow, MD;  Location: [facility];  Service: Endoscopy;  Laterality: N/A;   IR CT HEAD LTD   02/08/2022   IR PERCUTANEOUS ART THROMBECTOMY/INFUSION INTRACRANIAL INC DIAG ANGIO   02/08/2022   LITHOTRIPSY       MITRAL VALVE REPAIR   12/14/2020   PARATHYROIDECTOMY   2004    1 removed   RADIOLOGY WITH ANESTHESIA N/A 02/08/2022    Procedure: IR WITH ANESTHESIA;  Surgeon: Luanne Bras, MD;  Location: [place];  Service: Radiology;  Laterality: N/A;   ROTATOR CUFF REPAIR   2002   TEE WITHOUT CARDIOVERSION N/A 09/29/2020    Procedure: TRANSESOPHAGEAL ECHOCARDIOGRAM (TEE);  Surgeon: Teodoro Spray, MD;  Location: [facility];  Service: Cardiovascular;  Laterality: N/A;   TONSILLECTOMY   1956   TONSILLECTOMY       TOTAL ABDOMINAL HYSTERECTOMY W/ BILATERAL SALPINGOOPHORECTOMY   1984   VEIN LIGATION AND STRIPPING             Family History  Problem Relation Age of Onset   Heart disease Brother     Stroke Mother     Skin cancer Father     Breast cancer Paternal Grandmother      Social History:  reports that she has never smoked. She has never used smokeless tobacco. She reports that she does not drink alcohol and does not use drugs. Allergies:       Allergies  Allergen Reactions   Barium Iodide        Unknown    Lipitor [Atorvastatin] Other (See Comments)      Myalgias     Statins Other (See Comments)      Myalgias     Crestor [Rosuvastatin Calcium] Other (See Comments)      Myalgias     Penicillins Rash   Sulfa Antibiotics Rash   Sulfonamide Derivatives Rash          Medications Prior to Admission  Medication Sig Dispense Refill   acetaminophen (TYLENOL) 500 MG tablet Take 500 mg by mouth every 6 (six) hours as needed for moderate pain or headache.       [EXPIRED] cephALEXin (KEFLEX) 500 MG capsule Take 1 capsule (500 mg total) by mouth every 8 (eight) hours for 5 days. (Patient taking differently: Take 500  mg by mouth every 8 (eight) hours. 5 day course.) 15 capsule 0   cetirizine (ZYRTEC) 10 MG tablet Take 10 mg by mouth daily as needed for allergies.       furosemide (LASIX) 20 MG tablet Take 20 mg by mouth daily.       losartan (COZAAR) 25 MG tablet Take 1 tablet (25 mg total) by mouth daily. 90 tablet 1   metoprolol succinate (TOPROL-XL) 25 MG 24 hr tablet Take 12.5 mg by mouth daily.       mometasone (ELOCON) 0.1 % cream Apply twice daily as needed to itchy spots at body. Avoid applying to face, groin, and axilla. Use as directed. Long-term use can cause thinning of the skin. (Patient taking differently: Apply 1 Application topically 2 (two) times daily as needed (itchy spots).) 45 g 0   oxymetazoline (AFRIN) 0.05 % nasal spray Place 2 sprays into both nostrils 2 (two) times daily as needed (epistaxis only). 30 mL 0   potassium chloride SA (KLOR-CON M) 20 MEQ tablet Take 1 tablet (20 mEq total) by mouth daily. 30 tablet 3   saline (AYR) GEL Place 1 Application into both nostrils every 6 (six) hours. 14.1 g 0   traZODone (DESYREL) 50 MG tablet Take 50 mg by mouth at bedtime.       apixaban (ELIQUIS) 5 MG TABS tablet Take 5 mg by mouth 2 (two) times daily. (Patient not taking: Reported on 02/09/2022)       dapagliflozin propanediol (FARXIGA) 5 MG TABS tablet Every other day for 14 days,   then daily (Patient not taking: Reported on 02/09/2022) 30 tablet 0          Home: Home Living Family/patient expects to be discharged to:: Private residence Living Arrangements: Alone Available Help at Discharge: Family Type of Home: Independent living facility Home Access: [place]: One level Bathroom Toilet: Handicapped height Bathroom Accessibility: Yes Home Equipment: Conservation officer, nature (2 wheels) Additional Comments: Pt has resided at [facility] since 04/2021. She was living alone in a house prior to that time.   Functional History: Prior Function Prior Level of Function : Independent/Modified Independent, Driving Mobility Comments: no AD in apartment. RW to dining room and in community. ADLs Comments: Drives to get her hair and nails does, and also small grocery shopping trips. She prepares her own breakfast and dinner in her apartment and goes to dining room for lunch.   Functional Status:  Mobility: Bed Mobility Overal bed mobility: Needs Assistance Bed Mobility: Supine to Sit Supine to sit: HOB elevated, Min assist General bed mobility comments: +rail Transfers Overall transfer level: Needs assistance Equipment used: Rolling walker (2 wheels) Transfers: Sit to/from Stand, Bed to chair/wheelchair/BSC Sit to Stand: Mod assist, +2 safety/equipment Bed to/from chair/wheelchair/BSC transfer type:: Stand pivot Stand pivot transfers: Mod assist, +2 safety/equipment General transfer comment: pivot transfer bed to [facility] without AD. Sit to stand with RW for pericare. Ambulation/Gait Ambulation/Gait assistance: Min assist, +2 safety/equipment Gait Distance (Feet): 3 Feet Assistive device: Rolling walker (2 wheels) Gait Pattern/deviations: Step-through pattern, Decreased stride length, Trunk flexed General Gait Details: cues for posture. Pt fatigues quickly   ADL: ADL Overall ADL's : Needs assistance/impaired Eating/Feeding: Minimal assistance,  Sitting Grooming: Maximal assistance, Sitting Upper Body Bathing: Maximal assistance, Sitting Lower Body Bathing: Maximal assistance, Sit to/from stand Upper Body Dressing : Moderate assistance, Sitting Lower Body Dressing: Maximal assistance, Sit to/from stand Toilet Transfer: Minimal assistance, Stand-pivot, BSC/3in1 Toileting- Clothing Manipulation and Hygiene: Maximal assistance, Sit  to/from stand Functional mobility during ADLs: Minimal assistance General ADL Comments: most assist due to impaired cognition, communication and pain. limited to SP transfer for mobility due to elevated BP   Cognition: Cognition Overall Cognitive Status: Difficult to assess Orientation Level: Oriented to person, Oriented to place, Disoriented to time, Disoriented to situation Cognition Arousal/Alertness: Awake/alert Behavior During Therapy: Flat affect, Restless Overall Cognitive Status: Difficult to assess General Comments: Global aphasia (expressive worse than receptive). Following simple commands 50% of trials. More verbal today. Difficulty with word finding (frustration noted). Hard to discern receptive language difficulties vs HOH Difficult to assess due to: Impaired communication, Hard of hearing/deaf   Physical Exam: Blood pressure (!) 155/68, pulse (!) 55, temperature 97.8 F (36.6 C), temperature source Axillary, resp. rate (!) 24, weight 82.5 kg, SpO2 93 %. Physical Exam Neurological:     Comments: Patient is alert.  She is very hard of hearing globally aphasic but answered simple questions appropriately.  She does mimic some simple commands.    General: No acute distress Mood and affect are appropriate Heart: Irreg Irreg no murmur Lungs: Clear to auscultation, breathing unlabored, no rales or wheezes Abdomen: Positive bowel sounds, soft nontender to palpation, nondistended Extremities: No clubbing, cyanosis, or edema Skin: No evidence of breakdown, no evidence of rash Neurologic:  Cranial nerves II through XII intact, motor strength is 5/5 in Left 4+/5 Right deltoid, bicep, tricep, grip, hip flexor, knee extensors, ankle dorsiflexor and plantar flexor Sensory exam Withdraws to pinch Severe aphasia, unable to follow simple commands without gestural cues, per daughter also [facility]  Musculoskeletal: no jt deformities No joint swelling    Lab Results Last 48 Hours        Results for orders placed or performed during the hospital encounter of 02/08/22 (from the past 48 hour(s))  Glucose, capillary     Status: Abnormal    Collection Time: 02/11/22  3:19 PM  Result Value Ref Range    Glucose-Capillary 183 (H) 70 - 99 mg/dL      Comment: Glucose reference range applies only to samples taken after fasting for at least 8 hours.  Magnesium     Status: None    Collection Time: 02/11/22  4:29 PM  Result Value Ref Range    Magnesium 2.0 1.7 - 2.4 mg/dL      Comment: Performed at [facility], [place] [address] [place], [place] [address]  Phosphorus     Status: None    Collection Time: 02/11/22  4:29 PM  Result Value Ref Range    Phosphorus 2.9 2.5 - 4.6 mg/dL      Comment: Performed at [facility] [address] [place], [place] [address]  Glucose, capillary     Status: Abnormal    Collection Time: 02/11/22  8:10 PM  Result Value Ref Range    Glucose-Capillary 105 (H) 70 - 99 mg/dL      Comment: Glucose reference range applies only to samples taken after fasting for at least 8 hours.  Glucose, capillary     Status: Abnormal    Collection Time: 02/11/22 10:55 PM  Result Value Ref Range    Glucose-Capillary 115 (H) 70 - 99 mg/dL      Comment: Glucose reference range applies only to samples taken after fasting for at least 8 hours.  Glucose, capillary     Status: Abnormal    Collection Time: 02/12/22  2:55 AM  Result Value Ref Range    Glucose-Capillary 129 (H)  70 - 99 mg/dL      Comment: Glucose reference range applies only to samples taken after fasting for at  least 8 hours.  CBC     Status: Abnormal    Collection Time: 02/12/22  5:35 AM  Result Value Ref Range    WBC 9.6 4.0 - 10.5 K/uL    RBC 4.38 3.87 - 5.11 MIL/uL    Hemoglobin 14.6 12.0 - 15.0 g/dL    HCT 42.1 36.0 - 46.0 %    MCV 96.1 80.0 - 100.0 fL    MCH 33.3 26.0 - 34.0 pg    MCHC 34.7 30.0 - 36.0 g/dL    RDW 13.3 11.5 - 15.5 %    Platelets 139 (L) 150 - 400 K/uL      Comment: REPEATED TO VERIFY    nRBC 0.0 0.0 - 0.2 %      Comment: Performed at [facility], [place] [address] [place], [place] [address]  Basic metabolic panel     Status: Abnormal    Collection Time: 02/12/22  5:35 AM  Result Value Ref Range    Sodium 139 135 - 145 mmol/L    Potassium 3.9 3.5 - 5.1 mmol/L    Chloride 101 98 - 111 mmol/L    CO2 26 22 - 32 mmol/L    Glucose, Bld 134 (H) 70 - 99 mg/dL      Comment: Glucose reference range applies only to samples taken after fasting for at least 8 hours.    BUN 8 8 - 23 mg/dL    Creatinine, Ser 0.68 0.44 - 1.00 mg/dL    Calcium 8.9 8.9 - 10.3 mg/dL    GFR, Estimated >60 >60 mL/min      Comment: (NOTE) Calculated using the CKD-EPI Creatinine Equation (2021)      Anion gap 12 5 - 15      Comment: Performed at [facility] [address] [place], [place] [address]  Magnesium     Status: None    Collection Time: 02/12/22  5:35 AM  Result Value Ref Range    Magnesium 1.8 1.7 - 2.4 mg/dL      Comment: Performed at [facility] [address], [place],  [address]  Phosphorus     Status: None    Collection Time: 02/12/22  5:35 AM  Result Value Ref Range    Phosphorus 3.0 2.5 - 4.6 mg/dL      Comment: Performed at [facility] [address] [place], [place] [address]  Glucose, capillary     Status: Abnormal    Collection Time: 02/12/22  7:33 AM  Result Value Ref Range    Glucose-Capillary 148 (H) 70 - 99 mg/dL      Comment: Glucose reference range applies only to samples taken after fasting for at least 8 hours.  Glucose,  capillary     Status: Abnormal    Collection Time: 02/12/22 11:12 AM  Result Value Ref Range    Glucose-Capillary 117 (H) 70 - 99 mg/dL      Comment: Glucose reference range applies only to samples taken after fasting for at least 8 hours.  Glucose, capillary     Status: Abnormal    Collection Time: 02/12/22  3:37 PM  Result Value Ref Range    Glucose-Capillary 198 (H) 70 - 99 mg/dL      Comment: Glucose reference range applies only to samples taken after fasting for at least 8  hours.  CBC     Status: Abnormal    Collection Time: 02/12/22  4:20 PM  Result Value Ref Range    WBC 8.5 4.0 - 10.5 K/uL    RBC 4.47 3.87 - 5.11 MIL/uL    Hemoglobin 14.9 12.0 - 15.0 g/dL    HCT 42.5 36.0 - 46.0 %    MCV 95.1 80.0 - 100.0 fL    MCH 33.3 26.0 - 34.0 pg    MCHC 35.1 30.0 - 36.0 g/dL    RDW 13.2 11.5 - 15.5 %    Platelets 149 (L) 150 - 400 K/uL    nRBC 0.0 0.0 - 0.2 %      Comment: Performed at [facility], [place] [address], [place], [place] [address]  Glucose, capillary     Status: Abnormal    Collection Time: 02/12/22  7:12 PM  Result Value Ref Range    Glucose-Capillary 203 (H) 70 - 99 mg/dL      Comment: Glucose reference range applies only to samples taken after fasting for at least 8 hours.  Glucose, capillary     Status: None    Collection Time: 02/12/22 11:18 PM  Result Value Ref Range    Glucose-Capillary 95 70 - 99 mg/dL      Comment: Glucose reference range applies only to samples taken after fasting for at least 8 hours.  CBC     Status: Abnormal    Collection Time: 02/13/22  2:38 AM  Result Value Ref Range    WBC 6.5 4.0 - 10.5 K/uL    RBC 4.28 3.87 - 5.11 MIL/uL    Hemoglobin 14.0 12.0 - 15.0 g/dL    HCT 41.4 36.0 - 46.0 %    MCV 96.7 80.0 - 100.0 fL    MCH 32.7 26.0 - 34.0 pg    MCHC 33.8 30.0 - 36.0 g/dL    RDW 13.2 11.5 - 15.5 %    Platelets 143 (L) 150 - 400 K/uL    nRBC 0.0 0.0 - 0.2 %      Comment: Performed at [facility], [place] [address]  [place], [place] [address]  Basic metabolic panel     Status: Abnormal    Collection Time: 02/13/22  2:38 AM  Result Value Ref Range    Sodium 138 135 - 145 mmol/L    Potassium 3.5 3.5 - 5.1 mmol/L    Chloride 102 98 - 111 mmol/L    CO2 28 22 - 32 mmol/L    Glucose, Bld 102 (H) 70 - 99 mg/dL      Comment: Glucose reference range applies only to samples taken after fasting for at least 8 hours.    BUN 10 8 - 23 mg/dL    Creatinine, Ser 0.66 0.44 - 1.00 mg/dL    Calcium 8.8 (L) 8.9 - 10.3 mg/dL    GFR, Estimated >60 >60 mL/min      Comment: (NOTE) Calculated using the CKD-EPI Creatinine Equation (2021)      Anion gap 8 5 - 15      Comment: Performed at [facility] [address] [place], [place] [address]  Brain natriuretic peptide     Status: Abnormal    Collection Time: 02/13/22  2:38 AM  Result Value Ref Range    B Natriuretic Peptide 509.1 (H) 0.0 - 100.0 pg/mL      Comment: Performed at [facility] [address], [place], [place] [address]  Glucose, capillary     Status: Abnormal    Collection Time: 02/13/22  3:08 AM  Result Value Ref Range    Glucose-Capillary 115 (H) 70 - 99 mg/dL      Comment: Glucose reference range applies only to samples taken after fasting for at least 8 hours.  Glucose, capillary     Status: Abnormal    Collection Time: 02/13/22  7:28 AM  Result Value Ref Range    Glucose-Capillary 123 (H) 70 - 99 mg/dL      Comment: Glucose reference range applies only to samples taken after fasting for at least 8 hours.      Imaging Results (Last 48 hours)  No results found.         Blood pressure (!) 155/68, pulse (!) 55, temperature 97.8 F (36.6 C), temperature source Axillary, resp. rate (!) 24, weight 82.5 kg, SpO2 93 %.   Medical Problem List and Plan: 1. Functional deficits secondary to left MCA infarct/M3 occlusion status post TNK with revascularization.  ICH right frontal small hematoma likely hemorrhagic transformation status post  TNK.Marland KitchenHOLD ELIQUIS FOR 5-7 DAYS THEN RESUME IF NO MORE NOSEBLEEDS               -patient may  shower             -ELOS/Goals: 12-14d, minA 2.  Antithrombotics: -DVT/anticoagulation:  Mechanical: Antiembolism stockings, thigh (TED hose) Bilateral lower extremities             -antiplatelet therapy: Aspirin 81 mg daily 3. Pain Management: Tylenol as needed 4. Mood/Behavior/Sleep: Prozac 10 mg daily, trazodone as needed             -antipsychotic agents: N/A 5. Neuropsych/cognition: This patient is not capable of making decisions on her own behalf. 6. Skin/Wound Care: Routine skin checks 7. Fluids/Electrolytes/Nutrition: Routine in and outs with follow-up chemistries 8.  Atrial fibrillation.  Chronic Eliquis on hold.  Lopressor 12.5 mg twice daily, Cozaar 25 mg daily.  Cardiac rate controlled 9.  History of severe mitral regurgitation status post MitraClip 11/2020 10.  Diastolic congestive heart failure.  Lasix 20 mg daily.  Monitor for any signs of fluid overload 11.  Diabetes mellitus.  Hemoglobin A1c 6.6.  SSI.  Patient on Farxiga 5 mg daily prior to admission.  Resume as needed 12.  Hyperlipidemia.  Zetia 13.  GERD.  Protonix 14.  Epistaxis.  Packed by Dr. Hope Budds while at [facility], PA-C 02/13/2022  "I have personally performed a face to face diagnostic evaluation of this patient.  Additionally, I have reviewed and concur with the physician assistant's documentation above."  Charlett Blake M.D. [facility] Fellow Am Acad of [employer] Med and Rehab Diplomate Am Board of Electrodiagnostic Med Fellow Am Board of Interventional Pain

## 2022-02-13 NOTE — Progress Notes (Signed)
Patient ID: Linda [name], female   DOB: 1934-02-24, 88 y.o.   MRN: [id number]  INPATIENT REHABILITATION ADMISSION NOTE   Arrival Method: bed     Mental Orientation: A&O x4   Assessment: see flowsheet   Skin: CDI   IV'S: left wrist   Pain: none reported   Tubes and Drains: n/a   Safety Measures: in place   Vital Signs: see flowsheet   Height and Weight: see flowsheet   Rehab Orientation: completed   Family: at bedside    Notes:   Dorthula Nettles, Chestine Spore, BSN, [place], [place]

## 2022-02-14 DIAGNOSIS — I63512 Cerebral infarction due to unspecified occlusion or stenosis of left middle cerebral artery: Secondary | ICD-10-CM | POA: Diagnosis not present

## 2022-02-14 LAB — CBC WITH DIFFERENTIAL/PLATELET
Abs Immature Granulocytes: 0.02 10*3/uL (ref 0.00–0.07)
Basophils Absolute: 0 10*3/uL (ref 0.0–0.1)
Basophils Relative: 1 %
Eosinophils Absolute: 0.3 10*3/uL (ref 0.0–0.5)
Eosinophils Relative: 5 %
HCT: 44.1 % (ref 36.0–46.0)
Hemoglobin: 14.9 g/dL (ref 12.0–15.0)
Immature Granulocytes: 0 %
Lymphocytes Relative: 17 %
Lymphs Abs: 1 10*3/uL (ref 0.7–4.0)
MCH: 33 pg (ref 26.0–34.0)
MCHC: 33.8 g/dL (ref 30.0–36.0)
MCV: 97.8 fL (ref 80.0–100.0)
Monocytes Absolute: 0.8 10*3/uL (ref 0.1–1.0)
Monocytes Relative: 14 %
Neutro Abs: 3.8 10*3/uL (ref 1.7–7.7)
Neutrophils Relative %: 63 %
Platelets: 169 10*3/uL (ref 150–400)
RBC: 4.51 MIL/uL (ref 3.87–5.11)
RDW: 13.2 % (ref 11.5–15.5)
WBC: 6 10*3/uL (ref 4.0–10.5)
nRBC: 0 % (ref 0.0–0.2)

## 2022-02-14 LAB — COMPREHENSIVE METABOLIC PANEL
ALT: 29 U/L (ref 0–44)
AST: 27 U/L (ref 15–41)
Albumin: 2.8 g/dL — ABNORMAL LOW (ref 3.5–5.0)
Alkaline Phosphatase: 69 U/L (ref 38–126)
Anion gap: 7 (ref 5–15)
BUN: 12 mg/dL (ref 8–23)
CO2: 33 mmol/L — ABNORMAL HIGH (ref 22–32)
Calcium: 9.1 mg/dL (ref 8.9–10.3)
Chloride: 98 mmol/L (ref 98–111)
Creatinine, Ser: 0.82 mg/dL (ref 0.44–1.00)
GFR, Estimated: >60 mL/min (ref >60)
Glucose, Bld: 128 mg/dL — ABNORMAL HIGH (ref 70–99)
Potassium: 3.2 mmol/L — ABNORMAL LOW (ref 3.5–5.1)
Sodium: 138 mmol/L (ref 135–145)
Total Bilirubin: 1.8 mg/dL — ABNORMAL HIGH (ref 0.3–1.2)
Total Protein: 5.6 g/dL — ABNORMAL LOW (ref 6.5–8.1)

## 2022-02-14 LAB — GLUCOSE, CAPILLARY
Glucose-Capillary: 136 mg/dL — ABNORMAL HIGH (ref 70–99)
Glucose-Capillary: 199 mg/dL — ABNORMAL HIGH (ref 70–99)
Glucose-Capillary: 185 mg/dL — ABNORMAL HIGH (ref 70–99)
Glucose-Capillary: 190 mg/dL — ABNORMAL HIGH (ref 70–99)
Glucose-Capillary: 222 mg/dL — ABNORMAL HIGH (ref 70–99)

## 2022-02-14 LAB — PROCALCITONIN: Procalcitonin: <0.1 ng/mL

## 2022-02-14 LAB — VITAMIN D 25 HYDROXY (VIT D DEFICIENCY, FRACTURES): Vit D, 25-Hydroxy: 44.1 ng/mL (ref 30–100)

## 2022-02-14 MED ORDER — INSULIN ASPART 100 UNIT/ML IJ SOLN
0.0000 [IU] | Freq: Three times a day (TID) | INTRAMUSCULAR | Status: DC
  Administered 2022-02-14: 3 [IU] via SUBCUTANEOUS

## 2022-02-14 MED ORDER — POTASSIUM CHLORIDE CRYS ER 20 MEQ PO TBCR
40.0000 meq | EXTENDED_RELEASE_TABLET | Freq: Every day | ORAL | Status: DC
  Administered 2022-02-15 – 2022-02-16 (×2): 40 meq via ORAL
  Filled 2022-02-14 (×3): qty 2

## 2022-02-14 MED ORDER — TRANEXAMIC ACID FOR INHALATION
500.0000 mg | Freq: Three times a day (TID) | RESPIRATORY_TRACT | Status: DC
  Filled 2022-02-14 (×2): qty 10

## 2022-02-14 MED ORDER — VITAMIN D 25 MCG (1000 UNIT) PO TABS
1000.0000 [IU] | ORAL_TABLET | Freq: Every day | ORAL | Status: DC
Start: 2022-02-14 — End: 2022-02-27
  Administered 2022-02-14 – 2022-02-22 (×9): 1000 [IU] via ORAL
  Filled 2022-02-14 (×14): qty 1

## 2022-02-14 MED ORDER — TRANEXAMIC ACID FOR INHALATION
500.0000 mg | Freq: Three times a day (TID) | RESPIRATORY_TRACT | Status: DC
  Administered 2022-02-14 – 2022-02-15 (×2): 500 mg via RESPIRATORY_TRACT
  Filled 2022-02-14 (×4): qty 10

## 2022-02-14 NOTE — Progress Notes (Addendum)
Followed up on patient. Patient spitting up blood tinged sputum with small flecks of clots on kleenex. Alert, respiratory status stable and NAD. Discussed with RN who admitted her last night and nurse today--this has continued since admission. No worse and CBC today showed H/H to be stable. Reached out to pulmonary who followed her on acute as patient with h/o epistasis 08/20 followed by TNK 08/23. Rahul reviewed CXR and doubted PNA as WBC WNL and no fevers noted. Continue ASA for now.   He recommended tranexamic acid nebs every 8 hours. Procalcitonin this am was <0.10 so doubt PNA.    7:09 pm Addendum:  Called daughter to update her on above. She expressed concerns that this has been ongoing since extubation --seemed to be getting better briefly but now persistent. Informed her that we will repeat CBC in am to monitor H/H for stability. Nebs should target lungs only and help stop any bleeding.

## 2022-02-14 NOTE — Progress Notes (Signed)
Inpatient Rehabilitation Care Coordinator Assessment and Plan Patient Details  Name: Linda [name] MRN: [id number] Date of Birth: 03-08-1934  Today's Date: 02/14/2022  Hospital Problems: Principal Problem:   Left middle cerebral artery stroke [facility])  Past Medical History:  Past Medical History:  Diagnosis Date   Anxiety    Arrhythmia    paroxysmal atrial fibrillation   CHF (congestive heart failure) (HCC)    Colon polyp    Diverticulitis    Dysrhythmia    GERD (gastroesophageal reflux disease)    Hard of hearing    Hoarseness    Hyperlipidemia    Hypertension    Kidney stone    Motion sickness    Parathyroid disease ([place])    Parathyroidectomy    PONV (postoperative nausea and vomiting)    Pre-diabetes    PVC (premature ventricular contraction)    Skin cancer    Squamous cell carcinoma of skin 07/19/2021   right lower pretibia lateral, EDC   Squamous cell carcinoma of skin 10/13/2014   R ant neck - KA pattern   Squamous cell carcinoma of skin 09/15/2021   Left dorsal hand, shaved down to fat at time of biopsy so should already be removed.  Will observe for recurrence.   Stroke [facility])    TIA (transient ischemic attack) 04/2018   no deficitis   UTI (lower urinary tract infection)    Varicose veins of both lower extremities    Wears hearing aid in both ears    Past Surgical History:  Past Surgical History:  Procedure Laterality Date   ABDOMINAL HYSTERECTOMY     APPENDECTOMY     CATARACT EXTRACTION W/PHACO Left 03/05/2019   Procedure: CATARACT EXTRACTION PHACO AND INTRAOCULAR LENS PLACEMENT ([place])   0:57 15.6% 9.05;  Surgeon: Leandrew Koyanagi, MD;  Location: [place];  Service: Ophthalmology;  Laterality: Left;  Diabetic - diet cotrolled   CATARACT EXTRACTION W/PHACO Right 04/09/2019   Procedure: CATARACT EXTRACTION PHACO AND INTRAOCULAR LENS PLACEMENT ([facility]) RIGHT DIABETIC 00:47.6  15.9%  7.60;  Surgeon: Leandrew Koyanagi, MD;  Location: [place];  Service: Ophthalmology;  Laterality: Right;   CHOLECYSTECTOMY  2002   ESOPHAGOGASTRODUODENOSCOPY (EGD) WITH PROPOFOL N/A 12/17/2019   Procedure: ESOPHAGOGASTRODUODENOSCOPY (EGD) WITH PROPOFOL;  Surgeon: Robert Bellow, MD;  Location: [facility];  Service: Endoscopy;  Laterality: N/A;   IR CT HEAD LTD  02/08/2022   IR PERCUTANEOUS ART THROMBECTOMY/INFUSION INTRACRANIAL INC DIAG ANGIO  02/08/2022   LITHOTRIPSY     MITRAL VALVE REPAIR  12/14/2020   PARATHYROIDECTOMY  2004   1 removed   RADIOLOGY WITH ANESTHESIA N/A 02/08/2022   Procedure: IR WITH ANESTHESIA;  Surgeon: Luanne Bras, MD;  Location: [place];  Service: Radiology;  Laterality: N/A;   ROTATOR CUFF REPAIR  2002   TEE WITHOUT CARDIOVERSION N/A 09/29/2020   Procedure: TRANSESOPHAGEAL ECHOCARDIOGRAM (TEE);  Surgeon: Teodoro Spray, MD;  Location: [facility];  Service: Cardiovascular;  Laterality: N/A;   TONSILLECTOMY  1956   TONSILLECTOMY     TOTAL ABDOMINAL HYSTERECTOMY W/ BILATERAL SALPINGOOPHORECTOMY  1984   VEIN LIGATION AND STRIPPING     Social History:  reports that she has never smoked. She has never used smokeless tobacco. She reports that she does not drink alcohol and does not use drugs.  Family / Support Systems Children: Roderic Palau (son), [name] (daughter) Other Supports: Jana Half (neighbor) Anticipated Caregiver: SNF VS ILF Ability/Limitations of Caregiver: n/a Caregiver Availability: 24/7 Family Dynamics: support from children  Social History Preferred language:  English Religion: Patient Refused Health Literacy - How often do you need to have someone help you when you read instructions, pamphlets, or other written material from your doctor or pharmacy?: Patient unable to respond Writes: Yes   Abuse/Neglect Abuse/Neglect Assessment Can Be Completed: Unable to assess, patient is non-responsive or altered mental status Physical Abuse: Denies Verbal Abuse: Denies Sexual Abuse: Denies Exploitation of  patient/patient's resources: Denies Self-Neglect: Denies  Patient response to: Social Isolation - How often do you feel lonely or isolated from those around you?: Never  Emotional Status Recent Psychosocial Issues: hx of anxiety Psychiatric History: n/a Substance Abuse History: n/a  Patient / Family Perceptions, Expectations & Goals Pt/Family understanding of illness & functional limitations: yes Premorbid pt/family roles/activities: Previously driving and in the community Anticipated changes in roles/activities/participation: SNF vs. ILF Pt/family expectations/goals: [place]: Other (Comment) Librarian, academic) Is the patient able to respond to transportation needs?: No  Discharge Planning Living Arrangements: Alone Support Systems: Children Type of Residence: Independent Living Clinical research associate ILF) Financial Resources: Family Support, Social Security Financial Screen Referred: No Living Expenses: Rent Money Management: Family Does the patient have any problems obtaining your medications?: No Home Management: Independent Patient/Family Preliminary Plans: SNF VS. ILF Care Coordinator Barriers to Discharge: New oxygen, Wound Care, Incontinence, Lack of/limited family support, Decreased caregiver support Care Coordinator Anticipated Follow Up Needs: SNF, ALF/IL Expected length of stay: 12-14 days  Clinical Impression SW met with patient at bedside. Called patient son, introduced self and explained role. Patient son, Roderic Palau would like to have his sister, [name] changed to the primary contact for updates, questions and concerns. Sw changed patient daughter to primary contact and will reach out tomorrow for conference updates. No additional questions or concerns.  [name] 02/14/2022, 12:11 PM

## 2022-02-14 NOTE — Progress Notes (Signed)
Patients family called this nurse to the room. Patient coughing up dime to nickel sized chunks of bloody sputum. In no apparent signs of distress at this time. Stable on 2 L of [place] with humidiied air. PA Reesa Chew notified. No new orders at this time. Care ongoing. Bed to lowest setting, alarms in place. Call bell within reach.

## 2022-02-14 NOTE — Progress Notes (Signed)
Inpatient Rehabilitation Admission Medication Review by a Pharmacist  A complete drug regimen review was completed for this patient to identify any potential clinically significant medication issues.  High Risk Drug Classes Is patient taking? Indication by Medication  Antipsychotic No   Anticoagulant No   Antibiotic No   Opioid No   Antiplatelet No   Hypoglycemics/insulin Yes SSI - DM  Vasoactive Medication Yes Furosemide, losartan, metoprolol - CHF, BP  Chemotherapy No   Other Yes Fluoxetine - mood Pantoprazole - reflux Potassium - replacement     Type of Medication Issue Identified Description of Issue Recommendation(s)  Drug Interaction(s) (clinically significant)     Duplicate Therapy     Allergy     No Medication Administration End Date     Incorrect Dose     Additional Drug Therapy Needed     Significant med changes from prior encounter (inform family/care partners about these prior to discharge).    Other       Clinically significant medication issues were identified that warrant physician communication and completion of prescribed/recommended actions by midnight of the next day:  No  Pharmacist comments: Planning anti-coag in 5 to 7 days - > Eliquis  Time spent performing this drug regimen review (minutes):  30 minutes  Thank you Anette Guarneri, PharmD

## 2022-02-14 NOTE — Evaluation (Signed)
Physical Therapy Assessment and Plan  Patient Details  Name: Linda [name] MRN: [id number] Date of Birth: Sep 18, 1933  PT Diagnosis: Abnormal posture, Abnormality of gait, Cognitive deficits, Coordination disorder, Difficulty walking, Edema, Hemiparesis dominant, Impaired cognition, and Muscle weakness Rehab Potential: Good ELOS: 12-14 days   Today's Date: 02/14/2022 PT Individual Time: 0801-0913 PT Individual Time Calculation (min): 72 min    Hospital Problem: Principal Problem:   Left middle cerebral artery stroke [facility])   Past Medical History:  Past Medical History:  Diagnosis Date   Anxiety    Arrhythmia    paroxysmal atrial fibrillation   CHF (congestive heart failure) (HCC)    Colon polyp    Diverticulitis    Dysrhythmia    GERD (gastroesophageal reflux disease)    Hard of hearing    Hoarseness    Hyperlipidemia    Hypertension    Kidney stone    Motion sickness    Parathyroid disease ([place])    Parathyroidectomy    PONV (postoperative nausea and vomiting)    Pre-diabetes    PVC (premature ventricular contraction)    Skin cancer    Squamous cell carcinoma of skin 07/19/2021   right lower pretibia lateral, EDC   Squamous cell carcinoma of skin 10/13/2014   R ant neck - KA pattern   Squamous cell carcinoma of skin 09/15/2021   Left dorsal hand, shaved down to fat at time of biopsy so should already be removed.  Will observe for recurrence.   Stroke [facility])    TIA (transient ischemic attack) 04/2018   no deficitis   UTI (lower urinary tract infection)    Varicose veins of both lower extremities    Wears hearing aid in both ears    Past Surgical History:  Past Surgical History:  Procedure Laterality Date   ABDOMINAL HYSTERECTOMY     APPENDECTOMY     CATARACT EXTRACTION W/PHACO Left 03/05/2019   Procedure: CATARACT EXTRACTION PHACO AND INTRAOCULAR LENS PLACEMENT ([place])   0:57 15.6% 9.05;  Surgeon: Leandrew Koyanagi, MD;  Location: [place];  Service:  Ophthalmology;  Laterality: Left;  Diabetic - diet cotrolled   CATARACT EXTRACTION W/PHACO Right 04/09/2019   Procedure: CATARACT EXTRACTION PHACO AND INTRAOCULAR LENS PLACEMENT (IOC) RIGHT DIABETIC 00:47.6  15.9%  7.60;  Surgeon: Leandrew Koyanagi, MD;  Location: [place];  Service: Ophthalmology;  Laterality: Right;   CHOLECYSTECTOMY  2002   ESOPHAGOGASTRODUODENOSCOPY (EGD) WITH PROPOFOL N/A 12/17/2019   Procedure: ESOPHAGOGASTRODUODENOSCOPY (EGD) WITH PROPOFOL;  Surgeon: Robert Bellow, MD;  Location: [facility];  Service: Endoscopy;  Laterality: N/A;   IR CT HEAD LTD  02/08/2022   IR PERCUTANEOUS ART THROMBECTOMY/INFUSION INTRACRANIAL INC DIAG ANGIO  02/08/2022   LITHOTRIPSY     MITRAL VALVE REPAIR  12/14/2020   PARATHYROIDECTOMY  2004   1 removed   RADIOLOGY WITH ANESTHESIA N/A 02/08/2022   Procedure: IR WITH ANESTHESIA;  Surgeon: Luanne Bras, MD;  Location: [place];  Service: Radiology;  Laterality: N/A;   ROTATOR CUFF REPAIR  2002   TEE WITHOUT CARDIOVERSION N/A 09/29/2020   Procedure: TRANSESOPHAGEAL ECHOCARDIOGRAM (TEE);  Surgeon: Teodoro Spray, MD;  Location: [facility];  Service: Cardiovascular;  Laterality: N/A;   TONSILLECTOMY  1956   TONSILLECTOMY     TOTAL ABDOMINAL HYSTERECTOMY W/ BILATERAL SALPINGOOPHORECTOMY  1984   VEIN LIGATION AND STRIPPING      Assessment & Plan Clinical Impression: Patient is a 88 y.o. year old female with history of hearing loss, atrial fibrillation maintained  on Eliquis, left ventricular hypertrophy, severe mitral regurgitation status post MitraClip 0/0938, diastolic congestive heart failure, hypertension, TIA, hyperlipidemia, type 2 diabetes mellitus.  Per chart review patient lives in independent living facility/[facility].  She did use a rollator for long distances.  Presented [facility]/08/[facility] to [facility] with acute onset of right facial droop, right arm weakness global aphasia and field cut.  It was noted patient's Eliquis recently  discontinued due to nosebleed.  Epistaxis packed by ENT Dr. Richardson Landry at [facility].  Cranial CT scan showed no acute abnormality.  She did receive TNK.  CT angiogram head and neck occlusion left M3 branch supplying the left parietal lobe.  Patient was transferred to [facility].  CT perfusion revealed 8 mL of core infarct in left parietal lobe with surrounding penumbra.  MRA approximately 8 mm focus of susceptibility artifact and T2 hypointensity in the right precentral gyrus possibly representing an age-indeterminate hemorrhage that was not present in 2019.  Acute posterior left MCA territory infarct.  Underwent left MCA M3 occlusion with revascularization 02/12/2022 per interventional radiology.  MRI follow-up noting acute posterior left MCA territory infarct as well as right frontal hematoma likely hemorrhagic transformation from TNK.  Echocardiogram ejection fraction of 50 to 55% no wall motion abnormality.  She was cleared to begin low-dose aspirin therapy for CVA prophylaxis.  Her chronic Eliquis remains on hold.  Tolerating a regular diet.  Palliative care consulted to establish goals of care.  Therapy evaluations completed due to patient's right side weakness decreased functional mobility was admitted for a comprehensive rehab program.  Patient currently requires min with mobility secondary to muscle weakness, decreased cardiorespiratoy endurance and decreased oxygen support, impaired timing and sequencing, unbalanced muscle activation, motor apraxia, decreased coordination, and decreased motor planning, decreased initiation, decreased awareness, decreased problem solving, and decreased safety awareness, and decreased standing balance, decreased postural control, hemiplegia, and decreased balance strategies.  Prior to hospitalization, patient was modified independent  with mobility and lived with Alone in a Independent living facility home.  Home access is   .  Patient will benefit from skilled PT  intervention to maximize safe functional mobility, minimize fall risk, and decrease caregiver burden for planned discharge home with intermittent assist.  Anticipate patient will benefit from follow up [facility] at discharge.  PT - End of Session Activity Tolerance: Tolerates 30+ min activity with multiple rests Endurance Deficit: Yes Endurance Deficit Description: pt required multiple rest breaks PT Assessment Rehab Potential (ACUTE/IP ONLY): Good PT Barriers to Discharge: Decreased caregiver support;New oxygen PT Barriers to Discharge Comments: pt may require intermittent supervision upon D/C, aphasia, new O2 PT Patient demonstrates impairments in the following area(s): Balance;Edema;Endurance;Motor;Nutrition;Safety;Skin Integrity PT Transfers Functional Problem(s): Bed Mobility;Bed to Chair;Car;Furniture PT Locomotion Functional Problem(s): Ambulation;Wheelchair Mobility;Stairs PT Plan PT Intensity: Minimum of 1-2 x/day ,45 to 90 minutes PT Frequency: 5 out of 7 days PT Duration Estimated Length of Stay: 12-14 days PT Treatment/Interventions: Ambulation/gait training;Discharge planning;Functional mobility training;Psychosocial support;Therapeutic Activities;Visual/perceptual remediation/compensation;Balance/vestibular training;Disease management/prevention;Neuromuscular re-education;Skin care/wound management;Therapeutic Exercise;Wheelchair propulsion/positioning;Cognitive remediation/compensation;DME/adaptive equipment instruction;Pain management;Splinting/orthotics;UE/LE Strength taining/ROM;Community reintegration;Patient/family education;Stair training;UE/LE Coordination activities PT Transfers Anticipated Outcome(s): Mod I with LRAD PT Locomotion Anticipated Outcome(s): supervision with LRAD PT Recommendation Recommendations for Other Services: Speech consult Follow Up Recommendations: Home health PT Patient destination:  (IL[facility]) Equipment Recommended: To be determined Equipment  Details: unsure what kind of walker pt has (RW vs rollator)   PT Evaluation Precautions/Restrictions Precautions Precautions: Fall;Other (comment) Precaution Comments: HOH, aphaisa Restrictions Weight Bearing Restrictions: No Pain Interference Pain Interference  Pain Effect on Sleep: 0. Does not apply - I have not had any pain or hurting in the past 5 days Pain Interference with Therapy Activities: 8. Unable to answer (aphasia) Pain Interference with Day-to-Day Activities: 8. Unable to answer (aphasia) Home Living/Prior Functioning Home Living Living Arrangements: Alone Available Help at Discharge: Other (Comment) ([facility]) Type of Home: Independent living facility Home Layout: One level Bathroom Accessibility: Yes Additional Comments: most of history intake from chart review as commuication difficult due to aphasia and HOH. However pt was able to communicate some via yes/no questions.  Lives With: Alone Prior Function Level of Independence: Requires assistive device for independence Driving: Yes Vocation Requirements: Pt able to communicate that she did not use AD in her home but used it when going out and about Vision/Perception  Vision - History Ability to See in Adequate Light: 0 Adequate  Cognition Overall Cognitive Status: Difficult to assess [place], HOH) Arousal/Alertness: Lethargic Orientation Level: Oriented to person;Oriented to place Memory: Impaired Awareness: Impaired Problem Solving: Impaired Behaviors: Poor frustration tolerance Safety/Judgment: Impaired Comments: pt clearly very frustrated by her aphasia Sensation Sensation Light Touch: Appears Intact Proprioception: Appears Intact Additional Comments: grossly intact; difficult to get true assessment due to aphasia and HOH Coordination Gross Motor Movements are Fluid and Coordinated: No Fine Motor Movements are Fluid and Coordinated: No Coordination and Movement Description: grossly  uncoordinated due to mild R hemi, apraxia, HOH, impaired motor planning/sequencing, generalized weakness/deconditioning, and decreased standing balance. Finger Nose Finger Test: unable to perform due to apraxia, HOH, and difficulty understanding instructions Motor  Motor Motor: Hemiplegia;Motor apraxia Motor - Skilled Clinical Observations: grossly uncoordinated due to mild R hemi, apraxia, HOH, impaired motor planning/sequencing, generalized weakness/deconditioning, and decreased standing balance.  Trunk/Postural Assessment  Cervical Assessment Cervical Assessment: Exceptions to [facility] (forward head) Thoracic Assessment Thoracic Assessment: Exceptions to [facility] (kyphosis) Lumbar Assessment Lumbar Assessment: Exceptions to [facility] (posterior pelvic tilt) Postural Control Postural Control: Deficits on evaluation  Balance Balance Balance Assessed: Yes Static Sitting Balance Static Sitting - Balance Support: Feet supported;Bilateral upper extremity supported Static Sitting - Level of Assistance: 5: Stand by assistance (supervision) Dynamic Sitting Balance Dynamic Sitting - Balance Support: Feet supported;No upper extremity supported Dynamic Sitting - Level of Assistance: 5: Stand by assistance (supervision) Static Standing Balance Static Standing - Balance Support: Bilateral upper extremity supported;During functional activity (RW) Static Standing - Level of Assistance: 5: Stand by assistance (CGA) Dynamic Standing Balance Dynamic Standing - Balance Support: Bilateral upper extremity supported;During functional activity (RW) Dynamic Standing - Level of Assistance: 4: Min assist Dynamic Standing - Comments: with transfers and gait Extremity Assessment  RLE Assessment RLE Assessment: Exceptions to [facility] General Strength Comments: not formally tested due to apraxia and HOH but grossly 3+/5 LLE Assessment LLE Assessment: Exceptions to [facility] General Strength Comments: not formally tested due to  apraxia and HOH but grossly 3+/5  Care Tool Care Tool Bed Mobility Roll left and right activity   Roll left and right assist level: Supervision/Verbal cueing    Sit to lying activity        Lying to sitting on side of bed activity   Lying to sitting on side of bed assist level: the ability to move from lying on the back to sitting on the side of the bed with no back support.: Minimal Assistance - Patient > 75%     Care Tool Transfers Sit to stand transfer   Sit to stand assist level: Minimal Assistance - Patient > 75%  Chair/bed transfer   Chair/bed transfer assist level: Minimal Assistance - Patient > 75%     Psychologist, counselling transfer activity did not occur: Safety/medical concerns (fatigue, weakness/deconditioning)        Care Tool Locomotion Ambulation   Assist level: Minimal Assistance - Patient > 75% Assistive device: Walker-rolling Max distance: 11f  Walk 10 feet activity   Assist level: Minimal Assistance - Patient > 75% Assistive device: Walker-rolling   Walk 50 feet with 2 turns activity Walk 50 feet with 2 turns activity did not occur: Safety/medical concerns (fatigue, weakness/deconditioning)      Walk 150 feet activity Walk 150 feet activity did not occur: Safety/medical concerns (fatigue, weakness/deconditioning)      Walk 10 feet on uneven surfaces activity Walk 10 feet on uneven surfaces activity did not occur: Safety/medical concerns (fatigue, weakness/deconditioning)      Stairs Stair activity did not occur: Safety/medical concerns (fatigue, weakness/deconditioning)        Walk up/down 1 step activity Walk up/down 1 step or curb (drop down) activity did not occur: Safety/medical concerns (fatigue, weakness/deconditioning)      Walk up/down 4 steps activity Walk up/down 4 steps activity did not occur: Safety/medical concerns (fatigue, weakness/deconditioning)      Walk up/down 12 steps activity Walk up/down 12 steps  activity did not occur: Safety/medical concerns (fatigue, weakness/deconditioning)      Pick up small objects from floor Pick up small object from the floor (from standing position) activity did not occur: Safety/medical concerns (fatigue, weakness/deconditioning)      Wheelchair Is the patient using a wheelchair?: Yes Type of Wheelchair: Manual Wheelchair activity did not occur: Safety/medical concerns (fatigue, weakness/deconditioning)      Wheel 50 feet with 2 turns activity Wheelchair 50 feet with 2 turns activity did not occur: Safety/medical concerns (fatigue, weakness/deconditioning)    Wheel 150 feet activity Wheelchair 150 feet activity did not occur: Safety/medical concerns (fatigue, weakness/deconditioning)      Refer to Care Plan for Long Term Goals  SHORT TERM GOAL WEEK 1 PT Short Term Goal 1 (Week 1): pt will perform bed mobility with CGA overall PT Short Term Goal 2 (Week 1): pt will transfer bed<>chair with LRAD and supervision PT Short Term Goal 3 (Week 1): pt will ambulate 282fwith LRAD and CGA  Recommendations for other services: None   Skilled Therapeutic Intervention Evaluation completed (see details above and below) with education on PT POC and goals and individual treatment initiated with focus on functional mobility/transfers, dressing, generalized strengthening and endurance, dynamic standing balance/coordination, and ambulation. Received pt sidelying in bed asleep, pt slow to arouse and lethargic throughout session. Pt educated on PT evaluation, CIR policies, and therapy schedule and appeared agreeable. Pt with aphasia but able to communicate some via yes/no questions. Pt on 2.5L O2 via [place] with SPO2 96% - decreased to 2L, then 1L, then RA and SPO2 >97% at rest. Provided pt with 16x16 manual WC, RW, scrub clothes, and O2 tank. Pt transferred sidelying<>sitting EOB from flat bed with min A using bedrails. Doffed gown and donned clean pull over shirt with min A and  pants with max A for time management purposes. Pt stood with RW and min A and transferred bed<>WC stand<>pivot with RW and min A. Pt transferred sit<>stand with RW and min A and ambulated 106fith RW and min A. Pt persevering on stating "please" and when asked pt if she needed to sit, said "  yes' but proceeded to continue walking.  SPO2 dropped to 88% and unable to recover >90 on RA or on 1L - therefore increased back to 2L and SPO2 94% - MD arrived for morning rounds. Concluded session with pt sitting in WC, needs within reach, and seatbelt alarm on but no posey belt in room - notified RN/NT. Safety plan updated.   Mobility Bed Mobility Bed Mobility: Rolling Right;Right Sidelying to Sit Rolling Right: Supervision/verbal cueing Right Sidelying to Sit: Minimal Assistance - Patient > 75% Transfers Transfers: Sit to Stand;Stand to Sit;Stand Pivot Transfers Sit to Stand: Minimal Assistance - Patient > 75% Stand to Sit: Contact Guard/Touching assist Stand Pivot Transfers: Minimal Assistance - Patient > 75% Stand Pivot Transfer Details: Verbal cues for safe use of DME/AE Stand Pivot Transfer Details (indicate cue type and reason): verbal cues for hand placement on RW Transfer (Assistive device): Rolling walker Locomotion  Gait Ambulation: Yes Gait Assistance: Minimal Assistance - Patient > 75% Gait Distance (Feet): 10 Feet Assistive device: Rolling walker Gait Assistance Details: Verbal cues for safe use of DME/AE;Verbal cues for sequencing;Verbal cues for technique Gait Assistance Details: verbal cues for transfer technique and RW safety when turning Gait Gait: Yes Gait Pattern: Impaired Gait Pattern: Step-to pattern;Decreased step length - right;Decreased step length - left;Decreased stride length;Trunk flexed;Poor foot clearance - left;Poor foot clearance - right;Narrow base of support Gait velocity: decreased Stairs / Additional Locomotion Stairs: No Wheelchair Mobility Wheelchair  Mobility: No   Discharge Criteria: Patient will be discharged from PT if patient refuses treatment 3 consecutive times without medical reason, if treatment goals not met, if there is a change in medical status, if patient makes no progress towards goals or if patient is discharged from hospital.  The above assessment, treatment plan, treatment alternatives and goals were discussed and mutually agreed upon: by patient  Alfonse Alpers PT, DPT  02/14/2022, 12:15 PM

## 2022-02-14 NOTE — Progress Notes (Signed)
Inpatient Rehabilitation Center Individual Statement of Services  Patient Name:  Linda [name]  Date:  02/14/2022  Welcome to the [facility].  Our goal is to provide you with an individualized program based on your diagnosis and situation, designed to meet your specific needs.  With this comprehensive rehabilitation program, you will be expected to participate in at least 3 hours of rehabilitation therapies Monday-Friday, with modified therapy programming on the weekends.  Your rehabilitation program will include the following services:  Physical Therapy (PT), Occupational Therapy (OT), Speech Therapy (ST), 24 hour per day rehabilitation nursing, Therapeutic Recreaction (TR), Neuropsychology, Care Coordinator, Rehabilitation Medicine, Nutrition Services, Pharmacy Services, and Other  Weekly team conferences will be held on Wednesdays to discuss your progress.  Your Inpatient Rehabilitation Care Coordinator will talk with you frequently to get your input and to update you on team discussions.  Team conferences with you and your family in attendance may also be held.  Expected length of stay: 12-14 Days  Overall anticipated outcome:  Supervision- Min A  Depending on your progress and recovery, your program may change. Your Inpatient Rehabilitation Care Coordinator will coordinate services and will keep you informed of any changes. Your Inpatient Rehabilitation Care Coordinator's name and contact numbers are listed  below.  The following services may also be recommended but are not provided by the [place]:   [place] will be made to provide these services after discharge if needed.  Arrangements include referral to agencies that provide these services.  Your insurance has been verified to be:   Medicare A & B Your primary doctor is:  Deborra Medina, MD  Pertinent information  will be shared with your doctor and your insurance company.  Inpatient Rehabilitation Care Coordinator:  Erlene Quan, [place] or [phone]  Information discussed with and copy given to patient by: [name], 02/14/2022, 11:12 AM

## 2022-02-14 NOTE — Evaluation (Signed)
Occupational Therapy Assessment and Plan  Patient Details  Name: Linda [name] MRN: [id number] Date of Birth: 01/21/1934  OT Diagnosis: abnormal posture, acute pain, apraxia, and cognitive deficits Rehab Potential: Rehab Potential (ACUTE ONLY): Good ELOS: 10-12 days   Today's Date: 02/14/2022 OT Individual Time: 1315-1415 OT Individual Time Calculation (min): 60 min     Hospital Problem: Principal Problem:   Left middle cerebral artery stroke (HCC)   Past Medical History:  Past Medical History:  Diagnosis Date   Anxiety    Arrhythmia    paroxysmal atrial fibrillation   CHF (congestive heart failure) (HCC)    Colon polyp    Diverticulitis    Dysrhythmia    GERD (gastroesophageal reflux disease)    Hard of hearing    Hoarseness    Hyperlipidemia    Hypertension    Kidney stone    Motion sickness    Parathyroid disease (HCC)    Parathyroidectomy    PONV (postoperative nausea and vomiting)    Pre-diabetes    PVC (premature ventricular contraction)    Skin cancer    Squamous cell carcinoma of skin 07/19/2021   right lower pretibia lateral, EDC   Squamous cell carcinoma of skin 10/13/2014   R ant neck - KA pattern   Squamous cell carcinoma of skin 09/15/2021   Left dorsal hand, shaved down to fat at time of biopsy so should already be removed.  Will observe for recurrence.   Stroke (HCC)    TIA (transient ischemic attack) 04/2018   no deficitis   UTI (lower urinary tract infection)    Varicose veins of both lower extremities    Wears hearing aid in both ears    Past Surgical History:  Past Surgical History:  Procedure Laterality Date   ABDOMINAL HYSTERECTOMY     APPENDECTOMY     CATARACT EXTRACTION W/PHACO Left 03/05/2019   Procedure: CATARACT EXTRACTION PHACO AND INTRAOCULAR LENS PLACEMENT (IOC)   0:57 15.6% 9.05;  Surgeon: Brasington, Chadwick, MD;  Location: [facility];  Service: Ophthalmology;  Laterality: Left;  Diabetic - diet cotrolled   CATARACT  EXTRACTION W/PHACO Right 04/09/2019   Procedure: CATARACT EXTRACTION PHACO AND INTRAOCULAR LENS PLACEMENT (IOC) RIGHT DIABETIC 00:47.6  15.9%  7.60;  Surgeon: Brasington, Chadwick, MD;  Location: [facility];  Service: Ophthalmology;  Laterality: Right;   CHOLECYSTECTOMY  2002   ESOPHAGOGASTRODUODENOSCOPY (EGD) WITH PROPOFOL N/A 12/17/2019   Procedure: ESOPHAGOGASTRODUODENOSCOPY (EGD) WITH PROPOFOL;  Surgeon: Byrnett, Jeffrey W, MD;  Location: [facility];  Service: Endoscopy;  Laterality: N/A;   IR CT HEAD LTD  02/08/2022   IR PERCUTANEOUS ART THROMBECTOMY/INFUSION INTRACRANIAL INC DIAG ANGIO  02/08/2022   LITHOTRIPSY     MITRAL VALVE REPAIR  12/14/2020   PARATHYROIDECTOMY  2004   1 removed   RADIOLOGY WITH ANESTHESIA N/A 02/08/2022   Procedure: IR WITH ANESTHESIA;  Surgeon: Deveshwar, Sanjeev, MD;  Location: [facility];  Service: Radiology;  Laterality: N/A;   ROTATOR CUFF REPAIR  2002   TEE WITHOUT CARDIOVERSION N/A 09/29/2020   Procedure: TRANSESOPHAGEAL ECHOCARDIOGRAM (TEE);  Surgeon: Fath, Kenneth A, MD;  Location: [facility];  Service: Cardiovascular;  Laterality: N/A;   TONSILLECTOMY  1956   TONSILLECTOMY     TOTAL ABDOMINAL HYSTERECTOMY W/ BILATERAL SALPINGOOPHORECTOMY  1984   VEIN LIGATION AND STRIPPING      Assessment & Plan Clinical Impression:  Linda [name] is an 88-year-old right-handed female with history of hearing loss, atrial fibrillation maintained on Eliquis, left ventricular hypertrophy,   severe mitral regurgitation status post MitraClip 11/2020, diastolic congestive heart failure, hypertension, TIA, hyperlipidemia, type 2 diabetes mellitus.  Per chart review patient lives in independent living facility/[facility].  She did use a rollator for long distances.  Presented 02/08/2022 to [facility] with acute onset of right facial droop, right arm weakness global aphasia and field cut.  It was noted patient's Eliquis recently discontinued due to nosebleed.  Epistaxis packed by ENT  Dr. Bennett at [facility].  Cranial CT scan showed no acute abnormality.  She did receive TNK.  CT angiogram head and neck occlusion left M3 branch supplying the left parietal lobe.  Patient was transferred to [facility].  CT perfusion revealed 8 mL of core infarct in left parietal lobe with surrounding penumbra.  MRA approximately 8 mm focus of susceptibility artifact and T2 hypointensity in the right precentral gyrus possibly representing an age-indeterminate hemorrhage that was not present in 2019.  Acute posterior left MCA territory infarct.  Underwent left MCA M3 occlusion with revascularization 02/12/2022 per interventional radiology.  MRI follow-up noting acute posterior left MCA territory infarct as well as right frontal hematoma likely hemorrhagic transformation from TNK.  Echocardiogram ejection fraction of 50 to 55% no wall motion abnormality.  She was cleared to begin low-dose aspirin therapy for CVA prophylaxis.  Her chronic Eliquis remains on hold.  Tolerating a regular diet.  Palliative care consulted to establish goals of care.  Therapy evaluations completed due to patient's right side weakness decreased functional mobility was admitted for a comprehensive rehab program. Patient transferred to CIR on 02/13/2022 .    Patient currently requires mod with basic self-care skills secondary to muscle weakness, decreased cardiorespiratoy endurance and decreased oxygen support, motor apraxia, decreased coordination, and decreased motor planning, decreased initiation, decreased awareness, decreased problem solving, decreased safety awareness, and decreased memory, and decreased standing balance.  Prior to hospitalization, patient could complete all self-care at the indep to mod I level.  Patient will benefit from skilled intervention to increase independence with basic self-care skills and increase level of independence with iADL prior to discharge to ILF.  Anticipate patient will require 24 hour  supervision and follow up home health.  OT - End of Session Activity Tolerance: Tolerates < 10 min activity, no significant change in vital signs Endurance Deficit: Yes Endurance Deficit Description: O2 desat without supplemental O2 donned during UB dressing, required multiple rest breaks OT Assessment Rehab Potential (ACUTE ONLY): Good OT Barriers to Discharge: Incontinence;New oxygen OT Patient demonstrates impairments in the following area(s): Balance;Cognition;Endurance;Pain;Safety OT Basic ADL's Functional Problem(s): Bathing;Dressing;Toileting OT Advanced ADL's Functional Problem(s): Simple Meal Preparation OT Transfers Functional Problem(s): Toilet;Tub/Shower OT Plan OT Intensity: Minimum of 1-2 x/day, 45 to 90 minutes OT Frequency: 5 out of 7 days OT Duration/Estimated Length of Stay: 10-12 days OT Treatment/Interventions: Balance/vestibular training;Discharge planning;Pain management;Self Care/advanced ADL retraining;Therapeutic Activities;UE/LE Coordination activities;Cognitive remediation/compensation;Functional mobility training;Patient/family education;Therapeutic Exercise;Community reintegration;DME/adaptive equipment instruction;Neuromuscular re-education;Psychosocial support;UE/LE Strength taining/ROM OT Self Feeding Anticipated Outcome(s): Indep OT Basic Self-Care Anticipated Outcome(s): Mod to supervision OT Toileting Anticipated Outcome(s): Supervision OT Bathroom Transfers Anticipated Outcome(s): Supervision OT Recommendation Patient destination: Assisted Living (ILF) Follow Up Recommendations: Home health OT Equipment Recommended: To be determined   OT Evaluation Precautions/Restrictions  Precautions Precautions: Fall;Other (comment) Precaution Comments: HOH, aphaisa Restrictions Weight Bearing Restrictions: No Home Living/Prior Functioning Home Living Family/patient expects to be discharged to:: Assisted living Living Arrangements: Alone Available Help at  Discharge: Other (Comment) (ILF at [facility]) Type of Home: Independent living facility Home Layout: One level   Bathroom Toilet: Handicapped height Bathroom Accessibility: Yes Additional Comments: most of history intake from chart review as commuication difficult due to aphasia and HOH. However pt was able to communicate some via yes/no questions.  Lives With: Alone IADL History Homemaking Responsibilities: Yes Meal Prep Responsibility: Primary Current License: Yes Mode of Transportation: Car Prior Function Level of Independence: Requires assistive device for independence, Independent with basic ADLs Driving: Yes Vocation Requirements: Pt able to communicate that she did not use AD in her home but used it when going out and about Vision Baseline Vision/History: 1 Wears glasses Ability to See in Adequate Light: 0 Adequate Patient Visual Report: Blurring of vision Vision Assessment?: No apparent visual deficits Additional Comments: difficult to assess 2/2 aphasia and poor motor planning Perception  Perception: Impaired Praxis Praxis: Impaired Praxis Impairment Details: Motor planning Cognition Cognition Overall Cognitive Status: Difficult to assess (no family member present to determine baseline however per chart review, pt is different from baseline) Arousal/Alertness: Lethargic Memory: Impaired Awareness: Impaired Problem Solving: Impaired Behaviors: Poor frustration tolerance Safety/Judgment: Impaired Comments: pt clearly very frustrated by her aphasia Brief Interview for Mental Status (BIMS) Repetition of Three Words (First Attempt): No answer (unable to assess 2/2 aphasia) Temporal Orientation: Year: No answer (unable to assess 2/2 aphasia) Temporal Orientation: Month: No answer (unable to assess 2/2 aphasia) Temporal Orientation: Day: No answer (unable to assess 2/2 aphasia) Recall: "Sock": No answer (unable to assess 2/2 aphasia) Recall: "Blue": No answer (unable to  assess 2/2 aphasia) Recall: "Bed": No answer (unable to assess 2/2 aphasia) BIMS Summary Score: 99 Sensation Sensation Light Touch: Appears Intact Hot/Cold: Not tested Proprioception: Appears Intact Additional Comments: grossly intact; difficult to get true assessment due to aphasia and [facility] Coordination Gross Motor Movements are Fluid and Coordinated: No Fine Motor Movements are Fluid and Coordinated: No Coordination and Movement Description: grossly uncoordinated due to mild R hemi, apraxia, HOH, impaired motor planning/sequencing, generalized weakness/deconditioning, and decreased standing balance. Finger Nose Finger Test: unable to perform due to apraxia, HOH, and difficulty understanding instructions Motor  Motor Motor: Hemiplegia;Motor apraxia Motor - Skilled Clinical Observations: grossly uncoordinated due to mild R hemi, apraxia, HOH, impaired motor planning/sequencing, generalized weakness/deconditioning, and decreased standing balance.  Trunk/Postural Assessment  Cervical Assessment Cervical Assessment: Exceptions to [facility] (forward head) Thoracic Assessment Thoracic Assessment: Exceptions to [facility] (kyphosis) Lumbar Assessment Lumbar Assessment: Exceptions to [facility] (posterior pelvic tilt) Postural Control Postural Control: Deficits on evaluation  Balance Balance Balance Assessed: Yes Static Sitting Balance Static Sitting - Balance Support: Feet supported;Bilateral upper extremity supported Static Sitting - Level of Assistance: 5: Stand by assistance (supervision) Dynamic Sitting Balance Dynamic Sitting - Balance Support: Feet supported;No upper extremity supported Dynamic Sitting - Level of Assistance: 5: Stand by assistance (supervision) Static Standing Balance Static Standing - Balance Support: Bilateral upper extremity supported;During functional activity (RW) Static Standing - Level of Assistance: 5: Stand by assistance (CGA) Dynamic Standing Balance Dynamic Standing -  Balance Support: Bilateral upper extremity supported;During functional activity (RW) Dynamic Standing - Level of Assistance: 4: Min assist Dynamic Standing - Comments: with transfers and gait Extremity/Trunk Assessment RUE Assessment RUE Assessment: Exceptions to [facility] Active Range of Motion (AROM) Comments: WFL General Strength Comments: difficult to assess formally 2/2 apraxia and aphasia however 4-/5 proximally during transfers LUE Assessment LUE Assessment: Exceptions to [facility] Active Range of Motion (AROM) Comments: WFL General Strength Comments: difficult to assess formally 2/2 apraxia and aphasia however 4-/5 proximally during transfers  [place]  Oral Care         Bathing   Body parts bathed by patient: Right arm;Left arm;Chest;Abdomen;Front perineal area;Buttocks;Right upper leg;Left upper leg;Face Body parts bathed by helper: Right lower leg;Left lower leg   Assist Level: Minimal Assistance - Patient > 75%    Upper Body Dressing(including orthotics)   What is the patient wearing?: Pull over shirt   Assist Level: Minimal Assistance - Patient > 75%    Lower Body Dressing (excluding footwear)   What is the patient wearing?: Incontinence brief;Pants Assist for lower body dressing: Moderate Assistance - Patient 50 - 74%    Putting on/Taking off footwear   What is the patient wearing?: Non-skid slipper socks Assist for footwear: Dependent - Patient 0%       Care Tool Toileting Toileting activity   Assist for toileting: Maximal Assistance - Patient 25 - 49% (simulated during bathing/dressing)     Care Tool Bed Mobility Roll left and right activity        Sit to lying activity        Lying to sitting on side of bed activity         Care Tool Transfers Sit to stand transfer        Chair/bed transfer         Toilet transfer   Assist Level: Minimal Assistance - Patient > 75%     Care Tool Cognition  Expression of Ideas  and Wants Expression of Ideas and Wants: 2. Frequent difficulty - frequently exhibits difficulty with expressing needs and ideas  Understanding Verbal and Non-Verbal Content Understanding Verbal and Non-Verbal Content: 3. Usually understands - understands most conversations, but misses some part/intent of message. Requires cues at times to understand   Memory/Recall Ability Memory/Recall Ability : Current season;That he or she is in a hospital/hospital unit   Refer to Care Plan for [place] 1 OT Short Term Goal 1 (Week 1): Pt will complete 1/3 toileting steps with no more than CGA for balance OT Short Term Goal 2 (Week 1): Pt will complete LB dressing with CGA OT Short Term Goal 3 (Week 1): Pt will imrpove room air cardiorespiratory endurance for 2 consecutive sessions with SPO2 WNLs  Recommendations for other services: None    Skilled Therapeutic Intervention Skilled OT intervention completed with discussion on POC, rehab goals and explanation of OT purpose. Pt received upright in bed, agreeable to session. Pt remained globally aphasic however responded well to yes/no questions and responded verbally during session with responses however not always with accuracy. Pt did not indicate pain, but did alert therapist of nose bleed and coughing up blood. Therapist alerted nursing however was informed that pt had this early in AM and was presumably from picking at [facility] tube/nasal irritation. Pt was on 2 L O2 when received and did desat to 88% when removed from supplemental oxygen during dressing therefore continued at 2 L during session with pt staying at 95% SPO2. Pt completed bed mobility from upright in bed to sitting EOB with supervision. Completed min A sit > stand with HHA then stand pivot to w/c with min A. Cues needed for sequencing and hand placements. Completed bathing/dressing at sink at the sit > stand level using sink for balance. See caretool for further details on  assist level with self-care tasks performed. Upon removal of brief, pt was noted to be incontinent of heavy void however stated being aware of doing so but being unable to  call for assist. Transferred at min A ambulatory with HHA to EOB, then returned back to bed with supervision/cues. Therapist applied "HELP" label to nurse call button as well as practiced with pt and educated her on how to call for toileting needs. Pt remained upright in bed, with bed alarm on and all needs in reach at end of session.   ADL ADL Eating: Set up Where Assessed-Eating: Bed level Grooming: Setup Where Assessed-Grooming: Bed level Upper Body Bathing: Supervision/safety Where Assessed-Upper Body Bathing: Sitting at sink Lower Body Bathing: Minimal assistance Where Assessed-Lower Body Bathing: Sitting at sink;Standing at sink Upper Body Dressing: Minimal assistance Where Assessed-Upper Body Dressing: Sitting at sink Lower Body Dressing: Moderate assistance Where Assessed-Lower Body Dressing: Sitting at sink;Standing at sink Toileting: Maximal assistance Where Assessed-Toileting: Other (Comment) (simulated during LB bathing/dressing standing at sink) Toilet Transfer: Minimal assistance Toilet Transfer Method: Counselling psychologist: Other (comment) (simulated to w/c) Tub/Shower Transfer: Unable to assess Tub/Shower Transfer Method: Unable to assess Social research officer, government: Unable to assess Social research officer, government Method: Unable to assess Mobility  Bed Mobility Bed Mobility: Rolling Right;Right Sidelying to Sit Rolling Right: Supervision/verbal cueing Right Sidelying to Sit: Minimal Assistance - Patient > 75% Transfers Sit to Stand: Minimal Assistance - Patient > 75% Stand to Sit: Contact Guard/Touching assist   Discharge Criteria: Patient will be discharged from OT if patient refuses treatment 3 consecutive times without medical reason, if treatment goals not met, if there is a change in  medical status, if patient makes no progress towards goals or if patient is discharged from hospital.  The above assessment, treatment plan, treatment alternatives and goals were discussed and mutually agreed upon: by patient  Blase Mess, MS, OTR/L  02/14/2022, 3:14 PM

## 2022-02-14 NOTE — Evaluation (Addendum)
Speech Language Pathology Assessment and Plan  Patient Details  Name: Linda [name] MRN: [id number] Date of Birth: Jul 21, 1933  SLP Diagnosis: Aphasia;Apraxia (cognition difficult to assess at this time)  Rehab Potential: Good ELOS: 12-14 days    Today's Date: 02/14/2022 SLP Individual Time: 1000-1100 Total time: 60 minutes     Hospital Problem: Principal Problem:   Left middle cerebral artery stroke [facility])  Past Medical History:  Past Medical History:  Diagnosis Date   Anxiety    Arrhythmia    paroxysmal atrial fibrillation   CHF (congestive heart failure) (HCC)    Colon polyp    Diverticulitis    Dysrhythmia    GERD (gastroesophageal reflux disease)    Hard of hearing    Hoarseness    Hyperlipidemia    Hypertension    Kidney stone    Motion sickness    Parathyroid disease (HCC)    Parathyroidectomy    PONV (postoperative nausea and vomiting)    Pre-diabetes    PVC (premature ventricular contraction)    Skin cancer    Squamous cell carcinoma of skin 07/19/2021   right lower pretibia lateral, EDC   Squamous cell carcinoma of skin 10/13/2014   R ant neck - KA pattern   Squamous cell carcinoma of skin 09/15/2021   Left dorsal hand, shaved down to fat at time of biopsy so should already be removed.  Will observe for recurrence.   Stroke [facility])    TIA (transient ischemic attack) 04/2018   no deficitis   UTI (lower urinary tract infection)    Varicose veins of both lower extremities    Wears hearing aid in both ears    Past Surgical History:  Past Surgical History:  Procedure Laterality Date   ABDOMINAL HYSTERECTOMY     APPENDECTOMY     CATARACT EXTRACTION W/PHACO Left 03/05/2019   Procedure: CATARACT EXTRACTION PHACO AND INTRAOCULAR LENS PLACEMENT ([place])   0:57 15.6% 9.05;  Surgeon: Leandrew Koyanagi, MD;  Location: [place];  Service: Ophthalmology;  Laterality: Left;  Diabetic - diet cotrolled   CATARACT EXTRACTION W/PHACO Right 04/09/2019    Procedure: CATARACT EXTRACTION PHACO AND INTRAOCULAR LENS PLACEMENT (IOC) RIGHT DIABETIC 00:47.6  15.9%  7.60;  Surgeon: Leandrew Koyanagi, MD;  Location: [place];  Service: Ophthalmology;  Laterality: Right;   CHOLECYSTECTOMY  2002   ESOPHAGOGASTRODUODENOSCOPY (EGD) WITH PROPOFOL N/A 12/17/2019   Procedure: ESOPHAGOGASTRODUODENOSCOPY (EGD) WITH PROPOFOL;  Surgeon: Robert Bellow, MD;  Location: [facility];  Service: Endoscopy;  Laterality: N/A;   IR CT HEAD LTD  02/08/2022   IR PERCUTANEOUS ART THROMBECTOMY/INFUSION INTRACRANIAL INC DIAG ANGIO  02/08/2022   LITHOTRIPSY     MITRAL VALVE REPAIR  12/14/2020   PARATHYROIDECTOMY  2004   1 removed   RADIOLOGY WITH ANESTHESIA N/A 02/08/2022   Procedure: IR WITH ANESTHESIA;  Surgeon: Luanne Bras, MD;  Location: [place];  Service: Radiology;  Laterality: N/A;   ROTATOR CUFF REPAIR  2002   TEE WITHOUT CARDIOVERSION N/A 09/29/2020   Procedure: TRANSESOPHAGEAL ECHOCARDIOGRAM (TEE);  Surgeon: Teodoro Spray, MD;  Location: [facility];  Service: Cardiovascular;  Laterality: N/A;   TONSILLECTOMY  1956   TONSILLECTOMY     TOTAL ABDOMINAL HYSTERECTOMY W/ BILATERAL SALPINGOOPHORECTOMY  1984   VEIN LIGATION AND STRIPPING      Assessment / Plan / Recommendation Clinical Impression HPI: Linda [name] is an 88 year old right-handed female with history of hearing loss, atrial fibrillation maintained on Eliquis, left ventricular hypertrophy, severe mitral regurgitation  status post MitraClip 11/2561, diastolic congestive heart failure, hypertension, TIA, hyperlipidemia, type 2 diabetes mellitus.  Per chart review patient lives in independent living facility/[facility].  She did use a rollator for long distances.  Presented 02/08/20[facility] to [facility] with acute onset of right facial droop, right arm weakness global aphasia and field cut.  It was noted patient's Eliquis recently discontinued due to nosebleed.  Epistaxis packed by ENT Dr. Richardson Landry at [facility].   Cranial CT scan showed no acute abnormality.  She did receive TNK.  CT angiogram head and neck occlusion left M3 branch supplying the left parietal lobe.  Patient was transferred to [facility]  CT perfusion revealed 8 mL of core infarct in left parietal lobe with surrounding penumbra.  MRA approximately 8 mm focus of susceptibility artifact and T2 hypointensity in the right precentral gyrus possibly representing an age-indeterminate hemorrhage that was not present in 2019.  Acute posterior left MCA territory infarct.  Underwent left MCA M3 occlusion with revascularization 02/12/2022 per interventional radiology.  MRI follow-up noting acute posterior left MCA territory infarct as well as right frontal hematoma likely hemorrhagic transformation from TNK.  Echocardiogram ejection fraction of 50 to 55% no wall motion abnormality.  She was cleared to begin low-dose aspirin therapy for CVA prophylaxis.  Her chronic Eliquis remains on hold.  Tolerating a regular diet. Palliative care consulted to establish goals of care.  Therapy evaluations completed due to patient's right side weakness decreased functional mobility was admitted for a comprehensive rehab program on 02/13/2022.  SLP consulted to complete CSE and speech + language skills s/p CVA. Pt received awake/alert and OOB in w/c; receiving supplemental O2 via nasal cannula. Overtly frustrated by her new onset of aphasia. Intermittently coughing without present of PO; dry vocal quality throughout. Agreeable to ST evaluation. Of note, pt with documented hearing loss, and did not have her hearing aids present for today's session, which may have impacted pt's participation at times. Benefited from elevated voice, verbal repetition, and hand gestures.   Per portions of formal screener and informal assessment measures, pt presents with non-fluent aphasia most consistent with Broca's subtype (all four language modalities impacted), and appears further compounded  by verbal apraxia. Auditory comprehension is a relative strength of pt's, in comparison to verbal expression, though only appears to comprehend single step commands at this time. Responses to yes/no questions appear accurately if questions are r/t self, basic information, and environment; breakdown occurs at semi-complex level. Appears to be able to receptively identify single words in a field of 2-3 accurately. Did not assess writing due to time constraints. Verbal expression was marked by perseveration, phonemic paraphasias, inconsistent speech errors, agrammatism, and semantic paraphasias. Pt independently compensating, for impaired verbal expression, by utilizing hand gestures to convey her message and self-correcting verbal errors intermittently. Cognition was difficult to fully assess in light of the linguistic impairments.   Re: deglutition, pt with no focal deficits/weakness on OME, and presents with relatively persevered and functional oropharyngeal deglutition to support a regular diet with thin liquids. Pt with intermittent cough during PO intake, though also exhibited baseline cough. Vocal quality was clear and dry throughout. Per chart review, pt with hx of GERD per EGD completed at [facility] in 2019; question if this could be r/t reflux, though pt is currently on PPI. Further chart review reveals VSS and WBC WNL. Pt implemented safe swallowing strategies with Mod I. At this time, recommend continuation of regular diet textures and thin liquids. Medications may be administered whole  with thin liquid or puree. Swallow function appears at baseline; therefore, will not plan on addressing.  Given severity of language impairment and PLOF, recommend initiation of skilled ST intervention targeting all four language modalities in order to maximize pt's independence and decrease caregiver burden. Pt verbalized understanding and appears amenable to proposed ST POC. Please see below for details.    Skilled  Therapeutic Interventions          CSE, portions of the MS Aphasia Screener, and informal speech and language assessments administered. Please see above for details.   SLP Assessment  Patient will need skilled Speech Lanaguage Pathology Services during CIR admission    Recommendations  SLP Diet Recommendations: Age appropriate regular solids;Thin Liquid Administration via: Cup;Straw Medication Administration: Whole meds with liquid Supervision: Intermittent supervision to cue for compensatory strategies;Patient able to self feed Compensations: Minimize environmental distractions;Slow rate;Small sips/bites Postural Changes and/or Swallow Maneuvers: Seated upright 90 degrees;Upright 30-60 min after meal Oral Care Recommendations: Oral care BID Recommendations for Other Services: Therapeutic Recreation consult Patient destination:  (ILF) Follow up Recommendations: Home Health SLP;24 hour supervision/assistance Equipment Recommended: None recommended by SLP    SLP Frequency 3 to 5 out of 7 days   SLP Duration  SLP Intensity  SLP Treatment/Interventions 12-14 days  Minumum of 1-2 x/day, 30 to 90 minutes  Cueing hierarchy;Speech/Language facilitation;Patient/family education;Therapeutic Activities    Pain Pain Assessment Pain Scale: 0-10 Pain Score: 0-No pain  Prior Functioning Cognitive/Linguistic Baseline: Information not available Type of Home: Independent living facility  Lives With: Alone Available Help at Discharge: Other (Comment) (ILF at [employer]) Vocation: Retired  SLP Evaluation Cognition Overall Cognitive Status: Difficult to assess (due to aphasia) Arousal/Alertness: Awake/alert Orientation Level: Oriented to person;Oriented to place;Oriented to situation (via yes/no responses) Attention: Focused;Sustained Focused Attention: Appears intact Sustained Attention: Appears intact Memory:  (Difficult to assess) Awareness: Impaired Awareness Impairment: Emergent  impairment Problem Solving:  (Difficult to assess) Behaviors: Poor frustration tolerance;Perseveration Safety/Judgment: Other (comment) (Difficult to assess) Comments: pt clearly very frustrated by her aphasia  Comprehension Auditory Comprehension Overall Auditory Comprehension: Impaired Yes/No Questions: Impaired Basic Biographical Questions: 76-100% accurate (100% accuracy) Basic Immediate Environment Questions: 75-100% accurate Complex Questions: 0-24% accurate (0% - pt did not respond to questions despite visual supports and became frustrated) Commands: Impaired One Step Basic Commands: 75-100% accurate (Followed one-step commands ~70% of the time) Two Step Basic Commands: 0-24% accurate (0% accuracy) Conversation: Simple Other Conversation Comments: phonemic and semantic paraphasias noted Interfering Components: Hearing;Motor planning EffectiveTechniques: Extra processing time;Slowed speech;Increased volume Visual Recognition/Discrimination Discrimination: Exceptions to [facility] Common Objects: Able in field of 2 (receptive identification) Reading Comprehension Reading Status: Impaired Word level: Impaired (Pt selected appropriate word on 80% of trials) Sentence Level: Impaired (0%) Paragraph Level: Not tested Functional Environmental (signs, name badge): Unable to assess (comment) Interfering Components:  (visual inattention) Expression Expression Primary Mode of Expression: Verbal Verbal Expression Overall Verbal Expression: Impaired Initiation: No impairment Automatic Speech: Counting (Named 7 out of 10 numbers without prompts (1-10); unable to name DOW's likely due to increased motor planning demands) Level of Generative/Spontaneous Verbalization: Phrase Repetition: Impaired Level of Impairment: Word level Naming: Impairment Responsive: Not tested Confrontation: Impaired Common Objects: Able in field of 2 (receptive identification) Convergent: Not tested Divergent: Not  tested Verbal Errors: Semantic paraphasias;Phonemic paraphasias;Perseveration;Not aware of errors Pragmatics: No impairment Effective Techniques: Articulatory cues;Written cues Non-Verbal Means of Communication: Gestures (uses gestures to aid in communication of wants and needs when verbal communication is ineffective) Written Expression Dominant  Hand: Right Written Expression: Not tested Oral Motor Oral Motor/Sensory Function Overall Oral Motor/Sensory Function: Within functional limits Motor Speech Overall Motor Speech: Appears within functional limits for tasks assessed Respiration: Within functional limits Phonation: Normal Resonance: Within functional limits Articulation: Within functional limitis Intelligibility: Intelligible Motor Planning: Impaired Level of Impairment: Word Motor Speech Errors: Inconsistent;Unaware Interfering Components: Hearing loss  Care Tool Care Tool Cognition Ability to hear (with hearing aid or hearing appliances if normally used Ability to hear (with hearing aid or hearing appliances if normally used): 2. Moderate difficulty - speaker has to increase volume and speak distinctly   Expression of Ideas and Wants Expression of Ideas and Wants: 2. Frequent difficulty - frequently exhibits difficulty with expressing needs and ideas   Understanding Verbal and Non-Verbal Content Understanding Verbal and Non-Verbal Content: 3. Usually understands - understands most conversations, but misses some part/intent of message. Requires cues at times to understand  Memory/Recall Ability Memory/Recall Ability : Current season;That he or she is in a hospital/hospital unit   Bedside Swallowing Assessment General Date of Onset: 02/10/22 Previous Swallow Assessment: no Diet Prior to this Study: Regular;Thin liquids Temperature Spikes Noted: No Respiratory Status: Supplemental O2 delivered via (comment) History of Recent Intubation: Yes Length of Intubations (days): 1  days Date extubated: 02/10/22 Behavior/Cognition: Alert;Cooperative;Pleasant mood Oral Cavity - Dentition: Adequate natural dentition Self-Feeding Abilities: Needs assist;Able to feed self Patient Positioning: Upright in chair/Tumbleform Baseline Vocal Quality: Normal Volitional Cough: Cognitively unable to elicit Volitional Swallow: Unable to elicit   Ice Chips Ice chips: Not tested Thin Liquid Thin Liquid: Within functional limits Presentation: Straw Nectar Thick Nectar Thick Liquid: Not tested Honey Thick Honey Thick Liquid: Not tested Puree Puree: Not tested (pt declined) Solid Solid: Within functional limits Presentation: Self Fed BSE Assessment Suspected Esophageal Findings Suspected Esophageal Findings:  (N/A) Risk for Aspiration Impact on safety and function: Mild aspiration risk;No limitations Other Related Risk Factors: Decreased respiratory status (pt utilizing supplemental O2)  Short Term Goals: Week 1: SLP Short Term Goal 1 (Week 1): Pt will repeat CVC, CV, VC word combinations with 60% accuracy given Max A multimodal cues. SLP Short Term Goal 2 (Week 1): Pt will utilize multimodal communication to convey wants and needs x 8 with Set-Up A and/or question prompts. SLP Short Term Goal 3 (Week 1): Pt will answer environmental and semi-complex yes/no questions with 60% accuracy given Max A multimodal cues. SLP Short Term Goal 4 (Week 1): Pt will follow one-step commands on 80% of opportunities given Mod A multimodal cues. SLP Short Term Goal 5 (Week 1): Pt will participate in further reading and writing assessment to aid in d/c planning and ST POC with 100% completion.  Refer to Care Plan for Long Term Goals  Recommendations for other services: Therapeutic Recreation  Other outing?  Discharge Criteria: Patient will be discharged from SLP if patient refuses treatment 3 consecutive times without medical reason, if treatment goals not met, if there is a change in  medical status, if patient makes no progress towards goals or if patient is discharged from hospital.  The above assessment, treatment plan, treatment alternatives and goals were discussed and mutually agreed upon: by patient  Romelle Starcher A Christena Sunderlin 02/14/2022, 4:32 PM

## 2022-02-14 NOTE — Plan of Care (Signed)
  Problem: RH Comprehension Communication Goal: LTG Patient will comprehend basic/complex auditory (SLP) Description: LTG: Patient will comprehend basic/complex auditory information with cues (SLP). Flowsheets (Taken 02/14/2022 1656) LTG: Patient will comprehend: Basic auditory information LTG: Patient will comprehend auditory information with cueing (SLP): Minimal Assistance - Patient > 75%   Problem: RH Expression Communication Goal: LTG Patient will express needs/wants via multi-modal(SLP) Description: LTG:  Patient will express needs/wants via multi-modal communication (gestures/written, etc) with cues (SLP) Flowsheets (Taken 02/14/2022 1656) LTG: Patient will express needs/wants via multimodal communication (gestures/written, etc) with cueing (SLP): Minimal Assistance - Patient > 75% Goal: LTG Patient will verbally express basic/complex needs(SLP) Description: LTG:  Patient will verbally express basic/complex needs, wants or ideas with cues  (SLP) Flowsheets (Taken 02/14/2022 1656) LTG: Patient will verbally express basic/complex needs, wants or ideas (SLP):  Moderate Assistance - Patient 50 - 74%  Maximal Assistance - Patient 25 - 49%

## 2022-02-14 NOTE — Progress Notes (Signed)
Inpatient Rehabilitation  Patient information reviewed and entered into eRehab system by Shaunte Tuft M. Denecia Brunette, M.A., CCC/SLP, PPS Coordinator.  Information including medical coding, functional ability and quality indicators will be reviewed and updated through discharge.    

## 2022-02-14 NOTE — Progress Notes (Signed)
PROGRESS NOTE   Subjective/Complaints: Frustrated by her aphasia Satting 92% on room air IV in right arm Working with Vicente Males PT  ROS: denies pain   Objective:   DG CHEST PORT 1 VIEW  Result Date: 02/13/2022 CLINICAL DATA:  Altered mental status, coughing up blood EXAM: PORTABLE CHEST 1 VIEW COMPARISON:  Radiograph 02/10/2022 FINDINGS: Endotracheal tube and nasogastric tubes have been removed. There is a new left midlung opacity new from prior exam. No large pleural effusion. No pneumothorax. No acute osseous abnormality IMPRESSION: New left midlung opacity which could be developing pneumonia. Recommend continued radiographic follow-up. Electronically Signed   By: Maurine Simmering M.D.   On: 02/13/2022 11:24   Recent Labs    02/13/22 0238 02/14/22 0515  WBC 6.5 6.0  HGB 14.0 14.9  HCT 41.4 44.1  PLT 143* 169   Recent Labs    02/13/22 0238 02/14/22 0515  NA 138 138  K 3.5 3.2*  CL 102 98  CO2 28 33*  GLUCOSE 102* 128*  BUN 10 12  CREATININE 0.66 0.82  CALCIUM 8.8* 9.1    Intake/Output Summary (Last 24 hours) at 02/14/2022 0953 Last data filed at 02/14/2022 0900 Gross per 24 hour  Intake 480 ml  Output --  Net 480 ml        Physical Exam: Vital Signs Blood pressure 128/84, pulse 63, temperature 98.2 F (36.8 C), resp. rate 15, height '5\' 3"'$  (1.6 m), weight 75.7 kg, SpO2 100 %. Neurological:     Comments: Patient is alert.  She is very hard of hearing globally aphasic but answered simple questions appropriately.  She does mimic some simple commands.    General: No acute distress Mood and affect are appropriate Heart: Irreg Irreg no murmur Lungs: Clear to auscultation, breathing unlabored, no rales or wheezes, satting 92% on room air Abdomen: Positive bowel sounds, soft nontender to palpation, nondistended Extremities: No clubbing, cyanosis, or edema Skin: No evidence of breakdown, no evidence of  rash Neurologic: Cranial nerves II through XII intact, motor strength is 5/5 in Left 4+/5 Right deltoid, bicep, tricep, grip, hip flexor, knee extensors, ankle dorsiflexor and plantar flexor Sensory exam Withdraws to pinch Severe aphasia, unable to follow simple commands without gestural cues, per daughter also [facility]   Musculoskeletal: no jt deformities No joint swelling    Assessment/Plan: 1. Functional deficits which require 3+ hours per day of interdisciplinary therapy in a comprehensive inpatient rehab setting. Physiatrist is providing close team supervision and 24 hour management of active medical problems listed below. Physiatrist and rehab team continue to assess barriers to discharge/monitor patient progress toward functional and medical goals  Care Tool:  Bathing              Bathing assist       Upper Body Dressing/Undressing Upper body dressing        Upper body assist      Lower Body Dressing/Undressing Lower body dressing            Lower body assist       Toileting Toileting    Toileting assist Assist for toileting: Dependent - Patient 0%     Transfers Chair/bed transfer  Transfers assist  Chair/bed transfer activity did not occur: Safety/medical concerns  Chair/bed transfer assist level: Minimal Assistance - Patient > 75%     Locomotion Ambulation   Ambulation assist      Assist level: Minimal Assistance - Patient > 75% Assistive device: Walker-rolling Max distance: 2f   Walk 10 feet activity   Assist     Assist level: Minimal Assistance - Patient > 75% Assistive device: Walker-rolling   Walk 50 feet activity   Assist Walk 50 feet with 2 turns activity did not occur: Safety/medical concerns (fatigue, weakness/deconditioning)         Walk 150 feet activity   Assist Walk 150 feet activity did not occur: Safety/medical concerns (fatigue, weakness/deconditioning)         Walk 10 feet on uneven surface   activity   Assist Walk 10 feet on uneven surfaces activity did not occur: Safety/medical concerns (fatigue, weakness/deconditioning)         Wheelchair     Assist Is the patient using a wheelchair?: Yes Type of Wheelchair: Manual Wheelchair activity did not occur: Safety/medical concerns (fatigue, weakness/deconditioning)         Wheelchair 50 feet with 2 turns activity    Assist    Wheelchair 50 feet with 2 turns activity did not occur: Safety/medical concerns (fatigue, weakness/deconditioning)       Wheelchair 150 feet activity     Assist  Wheelchair 150 feet activity did not occur: Safety/medical concerns (fatigue, weakness/deconditioning)       Blood pressure 128/84, pulse 63, temperature 98.2 F (36.8 C), resp. rate 15, height '5\' 3"'$  (1.6 m), weight 75.7 kg, SpO2 100 %.  Medical Problem List and Plan: 1. Functional deficits secondary to left MCA infarct/M3 occlusion status post TNK with revascularization.  ICH right frontal small hematoma likely hemorrhagic transformation status post TNK..Marland KitchenOLD ELIQUIS FOR 5-7 DAYS THEN RESUME IF NO MORE NOSEBLEEDS               -patient may  shower             -ELOS/Goals: 12-14d, minA 2.  Antithrombotics: -DVT/anticoagulation:  Mechanical: Antiembolism stockings, thigh (TED hose) Bilateral lower extremities             -antiplatelet therapy: Aspirin 81 mg daily 3. Pain Management: Tylenol as needed 4. Mood/Behavior/Sleep: Prozac 10 mg daily, trazodone as needed             -antipsychotic agents: N/A 5. Neuropsych/cognition: This patient is not capable of making decisions on her own behalf. 6. Skin/Wound Care: Routine skin checks 7. Fluids/Electrolytes/Nutrition: Routine in and outs with follow-up chemistries 8.  Atrial fibrillation.  Chronic Eliquis on hold.  Lopressor 12.5 mg twice daily, Cozaar 25 mg daily.  Cardiac rate controlled 9.  History of severe mitral regurgitation status post MitraClip 11/2020 10.   Diastolic congestive heart failure.  Lasix 20 mg daily.  Monitor for any signs of fluid overload 11.  Diabetes mellitus.  Hemoglobin A1c 6.6.  SSI.  Patient on Farxiga 5 mg daily prior to admission.  Resume as needed 12.  Hyperlipidemia.  Zetia 13.  GERD.  Protonix 14.  Epistaxis.  [facility]. Screening for vitamin D deficiency: add vitamin D level to today's labs 16. Hypokalemia: increase daily potassium supplement to 445m daily 17. New left lung opacity which could be developing pneumonia: check procalcitonin level.   LOS: 1 days A FACE TO FACE EVALUATION WAS PERFORMED  KrMartha Clan  Nishaan Stanke 02/14/2022, 9:53 AM

## 2022-02-15 ENCOUNTER — Inpatient Hospital Stay (HOSPITAL_COMMUNITY): Payer: Medicare Other

## 2022-02-15 DIAGNOSIS — I4891 Unspecified atrial fibrillation: Secondary | ICD-10-CM

## 2022-02-15 DIAGNOSIS — R001 Bradycardia, unspecified: Secondary | ICD-10-CM

## 2022-02-15 LAB — GLUCOSE, CAPILLARY
Glucose-Capillary: 115 mg/dL — ABNORMAL HIGH (ref 70–99)
Glucose-Capillary: 151 mg/dL — ABNORMAL HIGH (ref 70–99)

## 2022-02-15 LAB — CBC
HCT: 42.5 % (ref 36.0–46.0)
Hemoglobin: 14.5 g/dL (ref 12.0–15.0)
MCH: 33.3 pg (ref 26.0–34.0)
MCHC: 34.1 g/dL (ref 30.0–36.0)
MCV: 97.5 fL (ref 80.0–100.0)
Platelets: 178 10*3/uL (ref 150–400)
RBC: 4.36 MIL/uL (ref 3.87–5.11)
RDW: 13.2 % (ref 11.5–15.5)
WBC: 5.8 10*3/uL (ref 4.0–10.5)
nRBC: 0 % (ref 0.0–0.2)

## 2022-02-15 MED ORDER — METFORMIN HCL 500 MG PO TABS
500.0000 mg | ORAL_TABLET | Freq: Every day | ORAL | Status: DC
  Administered 2022-02-16 – 2022-02-25 (×10): 500 mg via ORAL
  Filled 2022-02-15 (×10): qty 1

## 2022-02-15 MED ORDER — SALINE SPRAY 0.65 % NA SOLN: 1.0000 | NASAL | Status: DC | PRN

## 2022-02-15 MED ORDER — METOPROLOL SUCCINATE ER 25 MG PO TB24
12.5000 mg | ORAL_TABLET | Freq: Every day | ORAL | Status: DC
  Administered 2022-02-16: 12.5 mg via ORAL
  Filled 2022-02-15 (×3): qty 1

## 2022-02-15 NOTE — Patient Care Conference (Signed)
Inpatient RehabilitationTeam Conference and Plan of Care Update Date: 02/15/2022   Time: 11:36 AM   Patient Name: Linda [name] [facility]      Medical Record Number: [id number]  Date of Birth: 07/16/1933 Sex: Female         Room/Bed: 4W24C/4W24C-01 Payor Info: Payor: MEDICARE / Plan: MEDICARE PART A AND B / Product Type: *No Product type* /    Admit Date/Time:  02/13/2022  5:44 PM  Primary Diagnosis:  Left middle cerebral artery stroke [facility])  Hospital Problems: Principal Problem:   Left middle cerebral artery stroke [facility])    Expected Discharge Date: Expected Discharge Date: 02/28/22  Team Members Present: Physician leading conference: Dr. Leeroy Cha Social Worker Present: Erlene Quan, [place] Nurse Present: Dorien Chihuahua, RN;Other (comment) Tacy Learn, RN) PT Present: Becky Sax, PT OT Present: Laverle Hobby, OT SLP Present: Helaine Chess, SLP     Current Status/Progress Goal Weekly Team Focus  Bowel/Bladder   Continent-Incontinence, Last [facility] 02/13/22  Regain continence of bladder and bowels  Assist with BRP/Timed toileting   Swallow/Nutrition/ Hydration   Not addressing - pt on regular diet with thin liquids and adhereing to general aspiration precautions with Mod I  N/A  N/A   ADL's   min A bathing, min A UB dressing, Mod A LB dressing, Max A toileting, Min A ambulatory toilet transfer  Mod I, supervision for higher level transfers/self-care (may need to upgrade to all mod I if cog/awareness improves)  general strengthening, cardiorespiratory endurance, functional transfers, safety awareness, balance, ADL retraining   Mobility   bed mobility min A, transfers with RW min A, gait 31f with RW min A  Mod I, supervision gait  functional mobility/transfers, generalized strengthening and endurance, dynamic standing balance/coordination, gait training, NMR, and D/C planning.   Communication   Max A for verbal expression, Min-Mod A for auditory comprehension  Min A for basic  auditory comprehension, Min A for expression of wants/needs via multimodal communication, and Mod-Max A for verbal expression  Verbal expression, multimodal communication, and auditory comprehension   Safety/Cognition/ Behavioral Observations  Not addressing due to extent of language deficits  N/A  N/A   Pain   No pain  To remain free from pain  Assess Q4 and PRN   Skin   Ecchymosis to arms  Skin color to return to baseline  Assess QS and PRN     Discharge Planning:  SNF vs. ILF/ALF   Team Discussion: Left MCA/CVA. This AM RR called r/t lethargy and HR in 30's. EKG completed. Afib, hHQ:IONG Labs. May possibly be related to neb tx. Treatment stopped. Cards consulted. Will notify if patient needs higher level of care. Patient occasionally incontinent of urine LBM 08/29. Trazodone for sleep. Puncture site OTA, CDI. PT Min/Mod. OT Min/ModA, toileting MaxA. Speech MaxA d/t aphasia.  Patient on target to meet rehab goals: no, patient completed evaluation with therapies yesterday. Will adjust goals prior to discharge if needed  *See Care Plan and progress notes for long and short-term goals.   Revisions to Treatment Plan:  Medication adjustments, monitor labs, Cardiac consulted for RR call.  Teaching Needs: Medications, safety, gait/transfer training, skin/wound care, etc.  Current Barriers to Discharge: Decreased caregiver support, Incontinence, Wound care, and aphasia  Possible Resolutions to Barriers: Family education, nursing education, order recommended DME     Medical Summary Current Status: occasionally incontient of urine, puncture sites, overweight, possible developing pneumnia but procal is <.01, fatigued, HR down to 20s/30s today, atrial fibrillation, lethargy, epistaxis  Barriers to Discharge: Medical stability;Wound care  Barriers to Discharge Comments: occasionally incontient of urine, puncture sites, overweight, possible developing pneumonia but procal is <.01, fatigued, HR  down to 20s/30s today, atrial fibrillation, lethargy, epistaxis Possible Resolutions to [employer] Focus: continue bladder program, monitor wounds, provided dietary education to caughter, continue to monitor temperature, consulted cardiology, discontinued nebulizers, continue to monitor sputum   Continued Need for Acute Rehabilitation Level of Care: The patient requires daily medical management by a physician with specialized training in physical medicine and rehabilitation for the following reasons: Direction of a multidisciplinary physical rehabilitation program to maximize functional independence : Yes Medical management of patient stability for increased activity during participation in an intensive rehabilitation regime.: Yes Analysis of laboratory values and/or radiology reports with any subsequent need for medication adjustment and/or medical intervention. : Yes   I attest that I was present, lead the team conference, and concur with the assessment and plan of the team.   Ernest Pine 02/15/2022, 12:04 PM

## 2022-02-15 NOTE — Progress Notes (Signed)
PROGRESS NOTE   Subjective/Complaints: Bradycardic this morning to 20s/30s after receiving nebulizer, now stabilized, cardiology consulted, daughter updated HR 53-73 today, went to and from toilet with PT   ROS: denies pain, +fatigue   Objective:   DG CHEST PORT 1 VIEW  Result Date: 02/13/2022 CLINICAL DATA:  Altered mental status, coughing up blood EXAM: PORTABLE CHEST 1 VIEW COMPARISON:  Radiograph 02/10/2022 FINDINGS: Endotracheal tube and nasogastric tubes have been removed. There is a new left midlung opacity new from prior exam. No large pleural effusion. No pneumothorax. No acute osseous abnormality IMPRESSION: New left midlung opacity which could be developing pneumonia. Recommend continued radiographic follow-up. Electronically Signed   By: Maurine Simmering M.D.   On: 02/13/2022 11:24    Recent Labs    02/13/22 0238 02/14/22 0515  WBC 6.5 6.0  HGB 14.0 14.9  HCT 41.4 44.1  PLT 143* 169   Recent Labs    02/13/22 0238 02/14/22 0515  NA 138 138  K 3.5 3.2*  CL 102 98  CO2 28 33*  GLUCOSE 102* 128*  BUN 10 12  CREATININE 0.66 0.82  CALCIUM 8.8* 9.1    Intake/Output Summary (Last 24 hours) at 02/15/2022 0958 Last data filed at 02/14/2022 1839 Gross per 24 hour  Intake 240 ml  Output --  Net 240 ml        Physical Exam: Vital Signs Blood pressure (!) 137/97, pulse (!) 58, temperature 97.7 F (36.5 C), temperature source Oral, resp. rate 16, height '5\' 3"'$  (1.6 m), weight 75.7 kg, SpO2 99 %. Neurological:     Comments: Patient is alert.  She is very hard of hearing globally aphasic but answered simple questions appropriately.  She does mimic some simple commands.    General: No acute distress Mood and affect are appropriate Heart: Irreg Irreg no murmur, bradycardic Lungs: Clear to auscultation, breathing unlabored, no rales or wheezes, satting 92% on room air Abdomen: Positive bowel sounds, soft nontender  to palpation, nondistended Extremities: No clubbing, cyanosis, or edema Skin: No evidence of breakdown, no evidence of rash Neurologic: Cranial nerves II through XII intact, motor strength is 5/5 in Left 4+/5 Right deltoid, bicep, tricep, grip, hip flexor, knee extensors, ankle dorsiflexor and plantar flexor Sensory exam Withdraws to pinch Severe aphasia, unable to follow simple commands without gestural cues, per daughter also [facility]   Musculoskeletal: no jt deformities No joint swelling    Assessment/Plan: 1. Functional deficits which require 3+ hours per day of interdisciplinary therapy in a comprehensive inpatient rehab setting. Physiatrist is providing close team supervision and 24 hour management of active medical problems listed below. Physiatrist and rehab team continue to assess barriers to discharge/monitor patient progress toward functional and medical goals  Care Tool:  Bathing    Body parts bathed by patient: Right arm, Left arm, Chest, Abdomen, Front perineal area, Buttocks, Right upper leg, Left upper leg, Face   Body parts bathed by helper: Right lower leg, Left lower leg     Bathing assist Assist Level: Minimal Assistance - Patient > 75%     Upper Body Dressing/Undressing Upper body dressing   What is the patient wearing?: Pull over shirt  Upper body assist Assist Level: Minimal Assistance - Patient > 75%    Lower Body Dressing/Undressing Lower body dressing      What is the patient wearing?: Incontinence brief, Pants     Lower body assist Assist for lower body dressing: Moderate Assistance - Patient 50 - 74%     Toileting Toileting    Toileting assist Assist for toileting: Maximal Assistance - Patient 25 - 49% (simulated during bathing/dressing)     Transfers Chair/bed transfer  Transfers assist  Chair/bed transfer activity did not occur: Safety/medical concerns  Chair/bed transfer assist level: Minimal Assistance - Patient > 75%      Locomotion Ambulation   Ambulation assist      Assist level: Minimal Assistance - Patient > 75% Assistive device: Walker-rolling Max distance: 38f   Walk 10 feet activity   Assist     Assist level: Minimal Assistance - Patient > 75% Assistive device: Walker-rolling   Walk 50 feet activity   Assist Walk 50 feet with 2 turns activity did not occur: Safety/medical concerns (fatigue, weakness/deconditioning)         Walk 150 feet activity   Assist Walk 150 feet activity did not occur: Safety/medical concerns (fatigue, weakness/deconditioning)         Walk 10 feet on uneven surface  activity   Assist Walk 10 feet on uneven surfaces activity did not occur: Safety/medical concerns (fatigue, weakness/deconditioning)         Wheelchair     Assist Is the patient using a wheelchair?: Yes Type of Wheelchair: Manual Wheelchair activity did not occur: Safety/medical concerns (fatigue, weakness/deconditioning)         Wheelchair 50 feet with 2 turns activity    Assist    Wheelchair 50 feet with 2 turns activity did not occur: Safety/medical concerns (fatigue, weakness/deconditioning)       Wheelchair 150 feet activity     Assist  Wheelchair 150 feet activity did not occur: Safety/medical concerns (fatigue, weakness/deconditioning)       Blood pressure (!) 137/97, pulse (!) 58, temperature 97.7 F (36.5 C), temperature source Oral, resp. rate 16, height '5\' 3"'$  (1.6 m), weight 75.7 kg, SpO2 99 %.  Medical Problem List and Plan: 1. Functional deficits secondary to left MCA infarct/M3 occlusion status post TNK with revascularization.  ICH right frontal small hematoma likely hemorrhagic transformation status post TNK..Marland KitchenOLD ELIQUIS FOR 5-7 DAYS THEN RESUME IF NO MORE NOSEBLEEDS.                -patient may  shower             -ELOS/Goals: 12-14d, minA  -Interdisciplinary Team Conference today   2.  Antithrombotics: -DVT/anticoagulation:   Mechanical: Antiembolism stockings, thigh (TED hose) Bilateral lower extremities             -antiplatelet therapy: Aspirin 81 mg daily 3. Pain Management: Tylenol as needed 4. Mood/Behavior/Sleep: Prozac 10 mg daily, trazodone as needed             -antipsychotic agents: N/A 5. Neuropsych/cognition: This patient is not capable of making decisions on her own behalf. 6. Skin/Wound Care: Routine skin checks 7. Fluids/Electrolytes/Nutrition: Routine in and outs with follow-up chemistries 8.  Atrial fibrillation.  Chronic Eliquis on hold.  Lopressor 12.5 mg twice daily, Cozaar 25 mg daily.  Cardiac rate controlled 9.  History of severe mitral regurgitation status post MitraClip 11/2020 10.  Diastolic congestive heart failure.  Lasix 20 mg daily.  Monitor for any signs of  fluid overload 11.  Diabetes mellitus.  Hemoglobin A1c 6.6.  SSI.  Patient on Farxiga 5 mg daily prior to admission.  Resume as needed 12.  Hyperlipidemia.  Zetia 13.  GERD.  Protonix 14.  Epistaxis.  Packed by Dr. Hope Budds while at [facility] 15. Screening for vitamin D deficiency: add vitamin D level to today's labs 16. Hypokalemia: increase daily potassium supplement to 3mq daily 17. New left lung opacity which could be developing pneumonia: check procalcitonin level.  18. Bradycardia: d/ced the nebulizer treatments 19. Sputum with flecks of blood: continue to hold Eliquis until cardiology eval today 20. Lethargy today: consulted cardiology given associated bradycardia  LOS: 2 days A FACE TO FACE EVALUATION WAS PERFORMED  KClide DeutscherRaulkar 02/15/2022, 9:58 AM

## 2022-02-15 NOTE — Progress Notes (Signed)
Occupational Therapy Note  Patient Details  Name: [name] MRN: [id number] Date of Birth: 1934/05/25  Today's Date: 02/15/2022 OT Missed Time: 75 Minutes Missed Time Reason: CT/MRI;Other (comment) (Nursing contacted OT to hold therapy this AM due to order for CT STAT. Rapid response called this AM due to HR in the 30's.)    Ailene Ravel, OTR/L,CBIS  Supplemental OT - [facility]  02/15/2022, 11:15 AM

## 2022-02-15 NOTE — Progress Notes (Signed)
Physical Therapy Session Note  Patient Details  Name: Linda [name] MRN: [id number] Date of Birth: [birth year]  Today's Date: 02/15/2022 PT Individual Time: 2334-3568 PT Individual Time Calculation (min): 33 min   Short Term Goals: Week 1:  PT Short Term Goal 1 (Week 1): pt will perform bed mobility with CGA overall PT Short Term Goal 2 (Week 1): pt will transfer bed<>chair with LRAD and supervision PT Short Term Goal 3 (Week 1): pt will ambulate 47f with LRAD and CGA  Skilled Therapeutic Interventions/Progress Updates:      Pt seen for missed time earlier. Pt sidelying in bed on her R side, sleeping soundly but awakens to easily to voice and stimulation. Pt with significant expressive aphasia. She requires convincing and external assist for initiating mobilizing OOB. Required modA for supine<>sitting for BLE management and trunk support. She was able to scoot herself to EOB with CGA. Pt becoming emotional and experienced lability during the session.  Sit<>stand to RW with minA for powering to rise. Gait training within her room in tight spaces with minA and RW, ~142f Towards end of 1011fpt becoming ?fearful of falling, jerking the walker around and pushing it away from her, concern for fall. Provided wheelchair for safe sitting and then wheeled her sinkside where she combed her hair with setupA with cues needed for task initiation.  Transported in rehab hallway but patient becoming upset for unclear reason, perhaps because she's hesitant to leave her room. When asked if she would like water, she said 'yes' and was more agreeable to leaving her room.   Provided her with ice water and then patient becoming upset again. When attempted to redirect her, she would repeat herself "honey, no, honey no." Returned to her room as she was unable to be calmed in the rehab spaces.   She reported "pee" and able to discern that she had urge to void. Wheeled in bathroom and completed stand<>pivot transfer  using grab bars with minA. Pt continent of bladder and then assist back to w/c. Ambulatory transfer wtihin her room with minA and RW to bed and minA needed for sit>supine.   Concluded session in bed with all needs met, bed alarm on.   Therapy Documentation Precautions:  Precautions Precautions: Fall, Other (comment) Precaution Comments: HOH, aphaisa Restrictions Weight Bearing Restrictions: No General:     Therapy/Group: Individual Therapy  ChrAlger Simons30/2023, 3:21 PM

## 2022-02-15 NOTE — Consult Note (Addendum)
Cardiology Consultation   Patient ID: Linda [name] MRN: [id number]; DOB: 31-Dec-1933  Admit date: 02/13/2022 Date of Consult: 02/15/2022  PCP:  Crecencio Mc, MD   [place] Providers Cardiologist:  None        Patient Profile:   Linda [name] is a 88 y.o. female with a hx of CVA, atrial fibrillation on Eliquis, severe MR s/p mitraclip 11/2020, HFpEF, HTN, CVA, HLD, T2DM who is being seen 02/15/2022 for the evaluation of bradycardia at the request of Dr. Leeroy Cha.  History of Present Illness:    Linda [name] is a 88 y.o. female with a hx of recent left MCA CVA, atrial fibrillation on Eliquis, severe MR s/p mitraclip 11/2020, HFpEF, HTN, HLD, T2DM currently at CIR. This morning during her tranexamic acid nebulizer treatment, her HR dropped into the 30s. Nursing notes it would fluctuate between 30s then back to 50-70s. Rapid response called. She remained responsive and asymptomatic. Repeat CT head did not show any changes from prior. She remains asymptomatic still this afternoon. HR has been in 60-70s per nursing. She denies any pain, palpitations, lightheadedness, dizziness. She is noted to be spitting up blood tinged mucus. Denies epistaxis or bleeding gums.   Of note, she was seen on 8/20 for epistaxis requiring packing by ENT at [facility]. Subsequently her eliquis was held during that time. She developed right sided weakness and facial dropping. CT showed left parietal infarct with surrounding penumbra. She received TNK and then transferred to [facility] for thrombectomy. MRI showed acute posterior left MCA infarct along with right frontal hematoma likely hemorrhagic transformation from TNK. She was discharged to CIR. Comprehension is somewhat affected and word expression limited. She is working with PT/OT/SLP.    For her A-fib, she is on metoprolol 12.5 mg BID. She is not taking Eliquis but on aspirin 81 mg. Other pertinent medications include lasix 20 mg daily and losartan 25  mg daily. Echo on 02/09/22 showed EF 50-55%, LVH, left and right atrial enlargement, moderate MR s/p mitra-clip, moderate TR.   Past Medical History:  Diagnosis Date   Anxiety    Arrhythmia    paroxysmal atrial fibrillation   CHF (congestive heart failure) (HCC)    Colon polyp    Diverticulitis    Dysrhythmia    GERD (gastroesophageal reflux disease)    Hard of hearing    Hoarseness    Hyperlipidemia    Hypertension    Kidney stone    Motion sickness    Parathyroid disease (HCC)    Parathyroidectomy    PONV (postoperative nausea and vomiting)    Pre-diabetes    PVC (premature ventricular contraction)    Skin cancer    Squamous cell carcinoma of skin 07/19/2021   right lower pretibia lateral, EDC   Squamous cell carcinoma of skin 10/13/2014   R ant neck - KA pattern   Squamous cell carcinoma of skin 09/15/2021   Left dorsal hand, shaved down to fat at time of biopsy so should already be removed.  Will observe for recurrence.   Stroke [facility])    TIA (transient ischemic attack) 04/2018   no deficitis   UTI (lower urinary tract infection)    Varicose veins of both lower extremities    Wears hearing aid in both ears     Past Surgical History:  Procedure Laterality Date   ABDOMINAL HYSTERECTOMY     APPENDECTOMY     CATARACT EXTRACTION W/PHACO Left 03/05/2019   Procedure: CATARACT EXTRACTION  PHACO AND INTRAOCULAR LENS PLACEMENT (IOC)   0:57 15.6% 9.05;  Surgeon: Leandrew Koyanagi, MD;  Location: [place];  Service: Ophthalmology;  Laterality: Left;  Diabetic - diet cotrolled   CATARACT EXTRACTION W/PHACO Right 04/09/2019   Procedure: CATARACT EXTRACTION PHACO AND INTRAOCULAR LENS PLACEMENT (IOC) RIGHT DIABETIC 00:47.6  15.9%  7.60;  Surgeon: Leandrew Koyanagi, MD;  Location: [place];  Service: Ophthalmology;  Laterality: Right;   CHOLECYSTECTOMY  2002   ESOPHAGOGASTRODUODENOSCOPY (EGD) WITH PROPOFOL N/A 12/17/2019   Procedure:  ESOPHAGOGASTRODUODENOSCOPY (EGD) WITH PROPOFOL;  Surgeon: Robert Bellow, MD;  Location: [facility];  Service: Endoscopy;  Laterality: N/A;   IR CT HEAD LTD  02/08/2022   IR PERCUTANEOUS ART THROMBECTOMY/INFUSION INTRACRANIAL INC DIAG ANGIO  02/08/2022   LITHOTRIPSY     MITRAL VALVE REPAIR  12/14/2020   PARATHYROIDECTOMY  2004   1 removed   RADIOLOGY WITH ANESTHESIA N/A 02/08/2022   Procedure: IR WITH ANESTHESIA;  Surgeon: Luanne Bras, MD;  Location: [place];  Service: Radiology;  Laterality: N/A;   ROTATOR CUFF REPAIR  2002   TEE WITHOUT CARDIOVERSION N/A 09/29/2020   Procedure: TRANSESOPHAGEAL ECHOCARDIOGRAM (TEE);  Surgeon: Teodoro Spray, MD;  Location: [facility];  Service: Cardiovascular;  Laterality: N/A;   [place] W/ BILATERAL SALPINGOOPHORECTOMY  1984   VEIN LIGATION AND STRIPPING       Home Medications:  Prior to Admission medications   Medication Sig Start Date End Date Taking? Authorizing Provider  acetaminophen (TYLENOL) 500 MG tablet Take 500 mg by mouth every 6 (six) hours as needed for moderate pain or headache.    [provider]  aspirin 81 MG chewable tablet Chew 1 tablet (81 mg total) by mouth daily. 02/14/22   de Yolanda Manges, Cortney E, NP  cetirizine (ZYRTEC) 10 MG tablet Take 10 mg by mouth daily as needed for allergies.    [provider]  docusate sodium (COLACE) 100 MG capsule Take 1 capsule (100 mg total) by mouth 2 (two) times daily. 02/13/22   de Yolanda Manges, Cortney E, NP  ezetimibe (ZETIA) 10 MG tablet Take 1 tablet (10 mg total) by mouth daily. 02/14/22   de Yolanda Manges, Cortney E, NP  feeding supplement (ENSURE ENLIVE / ENSURE PLUS) LIQD Take 237 mLs by mouth 3 (three) times daily between meals. 02/13/22   de Yolanda Manges, Cortney E, NP  FLUoxetine (PROZAC) 10 MG capsule Take 1 capsule (10 mg total) by mouth daily. 02/13/22   de Yolanda Manges, Cortney E, NP  metoprolol tartrate (LOPRESSOR) 25  MG tablet Take 0.5 tablets (12.5 mg total) by mouth 2 (two) times daily. 02/13/22   de Yolanda Manges, Cortney E, NP  oxymetazoline (AFRIN) 0.05 % nasal spray Place 2 sprays into both nostrils 2 (two) times daily as needed (epistaxis only). 02/06/22   Loletha Grayer, MD  saline (AYR) GEL Place 1 Application into both nostrils every 6 (six) hours. 02/06/22   Loletha Grayer, MD  senna-docusate (SENOKOT-S) 8.6-50 MG tablet Take 1 tablet by mouth at bedtime as needed for moderate constipation or mild constipation. 02/13/22   de Yolanda Manges, Cortney E, NP  traZODone (DESYREL) 50 MG tablet Take 1 tablet (50 mg total) by mouth at bedtime as needed for sleep. 02/13/22   de Ashley Murrain, NP    Inpatient Medications: Scheduled Meds:  aspirin  81 mg Oral Daily   cholecalciferol  1,000 Units Oral  Daily   docusate sodium  100 mg Oral BID   ezetimibe  10 mg Oral Daily   FLUoxetine  10 mg Oral Daily   furosemide  20 mg Oral Daily   loratadine  10 mg Oral Daily   losartan  25 mg Oral Daily   [START ON 02/16/2022] metFORMIN  500 mg Oral Q breakfast   metoprolol tartrate  12.5 mg Oral BID   pantoprazole  40 mg Oral Q1200   potassium chloride SA  40 mEq Oral Daily   Continuous Infusions:  PRN Meds: acetaminophen **OR** acetaminophen (TYLENOL) oral liquid 160 mg/5 mL **OR** acetaminophen, saline, senna-docusate, sodium chloride, traZODone  Allergies:    Allergies  Allergen Reactions   Barium Iodide     Unknown   Lipitor [Atorvastatin] Other (See Comments)    Myalgias    Statins Other (See Comments)    Myalgias    Crestor [Rosuvastatin Calcium] Other (See Comments)    Myalgias    Penicillins Rash   Sulfa Antibiotics Rash   Sulfonamide Derivatives Rash    Social History:   Social History   Socioeconomic History   Marital status: Widowed    Spouse name: [name]   Number of children: 2   Years of education: 12   Highest education level: Not on file  Occupational History   Occupation:  Product/process development scientist: RETIRED    Comment: Retired  Tobacco Use   Smoking status: Never   Smokeless tobacco: Never  Vaping Use   Vaping Use: Never used  Substance and Sexual Activity   Alcohol use: No   Drug use: No   Sexual activity: Not Currently    Birth control/protection: None  Other Topics Concern   Not on file  Social History Narrative   Mariyah is from [place] and [place]. Recently widowed. She was married to Cyr, her husband for 59 years. They have a daughter and a son. She enjoys gardening.   Social Determinants of Health   Financial Resource Strain: Low Risk  (11/01/2021)   Overall Financial Resource Strain (CARDIA)    Difficulty of Paying Living Expenses: Not very hard  Food Insecurity: No Food Insecurity (11/01/2021)   Hunger Vital Sign    Worried About Running Out of Food in the Last Year: Never true    Ran Out of Food in the Last Year: Never true  Transportation Needs: No Transportation Needs (11/01/2021)   PRAPARE - Hydrologist (Medical): No    Lack of Transportation (Non-Medical): No  Physical Activity: Inactive (08/25/2020)   Exercise Vital Sign    Days of Exercise per Week: 0 days    Minutes of Exercise per Session: 0 min  Stress: No Stress Concern Present (11/01/2021)   [place]    Feeling of Stress : Not at all  Social Connections: Moderately Integrated (11/01/2021)   Social Connection and Isolation Panel [NHANES]    Frequency of Communication with Friends and Family: Twice a week    Frequency of Social Gatherings with Friends and Family: Once a week    Attends Religious Services: 1 to 4 times per year    Active Member of [employer] or Organizations: Yes    Attends Archivist Meetings: 1 to 4 times per year    Marital Status: Widowed  Intimate Partner Violence: Not At Risk (11/01/2021)   Humiliation, Afraid, Rape, and Kick questionnaire  Fear of Current or Ex-Partner: No    Emotionally Abused: No    Physically Abused: No    Sexually Abused: No    Family History:   Family History  Problem Relation Age of Onset   Heart disease Brother    Stroke Mother    Skin cancer Father    Breast cancer Paternal Grandmother      ROS:  Please see the history of present illness.  All other ROS reviewed and negative.     Physical Exam/Data:   Vitals:   02/15/22 0354 02/15/22 0858 02/15/22 0922 02/15/22 0933  BP: 136/65  (!) 137/97   Pulse: 60 63 (!) 58 71  Resp: '16 16 16   '$ Temp: 98.2 F (36.8 C)  97.7 F (36.5 C)   TempSrc: Oral  Oral   SpO2: 98% 98% 100% 99%  Weight:      Height:        Intake/Output Summary (Last 24 hours) at 02/15/2022 1413 Last data filed at 02/14/2022 1839 Gross per 24 hour  Intake 240 ml  Output --  Net 240 ml      02/13/2022    6:08 PM 02/12/2022    5:00 AM 02/11/2022    4:51 AM  Last 3 Weights  Weight (lbs) 166 lb 14.2 oz 181 lb 14.1 oz 171 lb 4.8 oz  Weight (kg) 75.7 kg 82.5 kg 77.7 kg     Body mass index is 29.56 kg/m.  General:  Well nourished, well developed, in no acute distress HEENT: no blood noted in nares Neck: supple Vascular: Distal pulses 2+ bilaterally Cardiac:  irregular rhythm, normal rate, systolic murmur best heard at apex Lungs:  normal work of breathing, clear to auscultation bilaterally Ext: no edema Musculoskeletal:  No deformities Skin: warm and dry  Neuro:  alert, severe aphasia, able to answer some questions Psych:  Normal affect   EKG:  The EKG was personally reviewed and demonstrates:  atrial fibrillation, T wave inversions in inferolateral leads  Relevant CV Studies: 8/24 2D Echocardiogram 1. Left ventricular ejection fraction, by estimation, is 50 to 55%. The  left ventricle has low normal function. The left ventricle has no regional  wall motion abnormalities. The left ventricular internal cavity size was  severely dilated. There is severe   focal  thickening of the basal septal segment measuring 1.7cm. The rest of  the LV segments demonstrate mild left ventricular hypertrophy. Left  ventricular diastolic parameters are indeterminate.   2. Right ventricular systolic function is normal. The right ventricular  size is mildly enlarged. PASP is 15mHg + RAP.   3. Left atrial size was severely dilated.   4. Right atrial size was severely dilated.   5. The mitral valve has been repaired/replaced. There is a Mitra-Clip  present in the mitral position. The mean gradient is 717mg at HR 87bpm.  There is moderate mitral regurgitation.   6. Tricuspid valve regurgitation is moderate.   7. The aortic valve is tricuspid. Aortic valve regurgitation is not  visualized. Aortic valve sclerosis is present, with no evidence of aortic  valve stenosis.  Laboratory Data:  High Sensitivity Troponin:   Recent Labs  Lab 02/05/22 0100  TROPONINIHS 23*     Chemistry Recent Labs  Lab 02/11/22 0609 02/11/22 1629 02/12/22 0535 02/13/22 0238 02/14/22 0515  NA 137  --  139 138 138  K 3.6  --  3.9 3.5 3.2*  CL 102  --  101 102 98  CO2 25  --  26 28 33*  GLUCOSE 148*  --  134* 102* 128*  BUN 9  --  '8 10 12  '$ CREATININE 0.71  --  0.68 0.66 0.82  CALCIUM 8.5*  --  8.9 8.8* 9.1  MG 2.2 2.0 1.8  --   --   GFRNONAA >60  --  >60 >60 >60  ANIONGAP 10  --  '12 8 7    '$ Recent Labs  Lab 02/08/22 1919 02/14/22 0515  PROT 6.7 5.6*  ALBUMIN 3.9 2.8*  AST 27 27  ALT 23 29  ALKPHOS 76 69  BILITOT 1.7* 1.8*   Lipids  Recent Labs  Lab 02/09/22 0644  CHOL 130  TRIG 25  HDL 48  LDLCALC 77  CHOLHDL 2.7    Hematology Recent Labs  Lab 02/13/22 0238 02/14/22 0515 02/15/22 0918  WBC 6.5 6.0 5.8  RBC 4.28 4.51 4.36  HGB 14.0 14.9 14.5  HCT 41.4 44.1 42.5  MCV 96.7 97.8 97.5  MCH 32.7 33.0 33.3  MCHC 33.8 33.8 34.1  RDW 13.2 13.2 13.2  PLT 143* 169 178   Thyroid No results for input(s): "TSH", "FREET4" in the last 168 hours.  BNP Recent Labs   Lab 02/13/22 0238  BNP 509.1*    DDimer No results for input(s): "DDIMER" in the last 168 hours.   Radiology/Studies:  CT HEAD WO CONTRAST (5MM)  Result Date: 02/15/2022 CLINICAL DATA:  Stroke.  Follow-up. EXAM: CT HEAD WITHOUT CONTRAST TECHNIQUE: Contiguous axial images were obtained from the base of the skull through the vertex without intravenous contrast. RADIATION DOSE REDUCTION: This exam was performed according to the departmental dose-optimization program which includes automated exposure control, adjustment of the mA and/or kV according to patient size and/or use of iterative reconstruction technique. COMPARISON:  02/10/2022 and previous FINDINGS: Brain: No acute brainstem finding. Old small vessel infarction of the right cerebellum appears unchanged. 1 cm intraparenchymal hemorrhage within the right parietal white matter is unchanged. Chronic small-vessel ischemic changes otherwise affecting the right cerebral hemisphere. Subacute infarction of the left temporoparietal junction. Minimal petechial blood products appear questionably increased. No frank hematoma however. Slightly more regional swelling. One or 2 mm of left-to-right midline shift. Other small cortical and subcortical infarctions in the frontal and parietal region are barely visible by CT. No hydrocephalus. No extra-axial collection. Vascular: There is atherosclerotic calcification of the major vessels at the base of the brain. Skull: Negative Sinuses/Orbits: Chronic inflammatory changes of the right maxillary sinus. Orbits negative. Other: None IMPRESSION: No change in a 1 cm intraparenchymal hemorrhage in the right parietal white matter. Subacute infarction at the left temporoparietal junction as seen previously. There may be minimal increase in petechial bleeding in the region, but there is no measurable hematoma. Mild mass effect with left-to-right shift of 1-2 mm. Electronically Signed   By: Nelson Chimes M.D.   On: 02/15/2022  12:26   DG CHEST PORT 1 VIEW  Result Date: 02/13/2022 CLINICAL DATA:  Altered mental status, coughing up blood EXAM: PORTABLE CHEST 1 VIEW COMPARISON:  Radiograph 02/10/2022 FINDINGS: Endotracheal tube and nasogastric tubes have been removed. There is a new left midlung opacity new from prior exam. No large pleural effusion. No pneumothorax. No acute osseous abnormality IMPRESSION: New left midlung opacity which could be developing pneumonia. Recommend continued radiographic follow-up. Electronically Signed   By: Maurine Simmering M.D.   On: 02/13/2022 11:24     Assessment and Plan:   Bradycardia She was receiving tranexamic acid nebulizer during bradycardia episode.  No further episodes since then. HR remains in 60-70. Unlikely from metoprolol given small dose.  - tranexamic acid neb stopped by primary team - continue to monitor for further bradycardic episodes - if bradycardic again then consider stopping metoprolol  Atrial Fibrillation  Will change to Toprol XL and reduce the total beta-blocker dose to 12.5 mg.  We will give at bedtime starting tomorrow.  Continue aspirin 81 mg daily. Rate controlled. CBC normal.  - Plan to switch back to Eliquis in 3-4 days if no more epistaxis or other bleeding.     Risk Assessment/Risk Scores:        New York Heart Association (NYHA) Functional Class NYHA Class II  CHA2DS2-VASc Score = 9       For questions or updates, please contact [place] Please consult [email or web address] for contact info under    Signed, Angelique Blonder, DO  02/15/2022 2:13 PM   ATTENDING ATTESTATION:  After conducting a review of all available clinical information with the care team, interviewing the patient, and performing a physical exam, I agree with the findings and plan described in this note.   GEN: No acute distress.   HEENT:  MMM, no JVD, no scleral icterus Cardiac: Irregular RR, with soft systolic murmur  Respiratory: Clear to auscultation  bilaterally. GI: Soft, nontender, non-distended  MS: No edema; No deformity. Neuro:  Aphasic Vasc:  +2 radial pulses  The patient is an 88 year old female with a history of embolic stroke recently due to cessation of Eliquis following epistaxis, atrial fibrillation previously on Eliquis, severe rheumatic mitral regurgitation status post MitraClip at [facility] 2022, hypertension, hyperlipidemia, and type 2 diabetes who developed asymptomatic bradycardia during a breathing treatment earlier today (tranexemic acid treatment for hemoptysis--suggesting possibly a vagally mediated mechanism).  The patient was asymptomatic.  Unfortunately telemetry is not available for review.  EKG demonstrated rate controlled atrial fibrillation with nonspecific ST and T wave changes.  We will change beta-blocker from Lopressor 12.5 mg twice daily to Toprol-XL 12.5 mg at bedtime starting tomorrow night.  If this recurs we may need to hold the dose.  Eliquis will be restarted in a few days per neurology.  Lenna Sciara, MD Pager [phone]

## 2022-02-15 NOTE — Progress Notes (Signed)
Speech Language Pathology Daily Session Note  Patient Details  Name: Linda [name] MRN: [id number] Date of Birth: [birth year]  Today's Date: 02/15/2022 SLP Individual Time: 1305-1400 SLP Individual Time Calculation (min): 55 min  Short Term Goals: Week 1: SLP Short Term Goal 1 (Week 1): Pt will repeat CVC, CV, VC word combinations with 60% accuracy given Max A multimodal cues. SLP Short Term Goal 2 (Week 1): Pt will utilize multimodal communication to convey wants and needs x 8 with Set-Up A and/or question prompts. SLP Short Term Goal 3 (Week 1): Pt will answer environmental and semi-complex yes/no questions with 60% accuracy given Max A multimodal cues. SLP Short Term Goal 4 (Week 1): Pt will follow one-step commands on 80% of opportunities given Mod A multimodal cues. SLP Short Term Goal 5 (Week 1): Pt will participate in further reading and writing assessment to aid in d/c planning and ST POC with 100% completion.  Skilled Therapeutic Interventions: Skilled ST treatment focused on language goals. Pt was received supine in bed on arrival and agreeable to ST intervention with head nod. Pt requested to brush teeth which was communicated through multimodal means by pointing to sink and then to her teeth. Pt transferred to wheelchair with min A and performed oral care with min A for sequencing.   Pt responded to basic yes/no questions during approximately 25% of opportunities through non-verbal means and verbal responses with max A. Pt primarily communicated through gestures and single word responses.   SLP then navigated pt out of room to speech therapy office for tx. Once arrived, pt appeared anxious and perseverated on the phrase "please, honey" with incomprehensible gestures. Verbal explanation for tx session nor encouragement/reassurance were beneficial. Pt was eventually taken back to room and appeared mildly relieved. She transferred back to bed with sup A. Pt still known to perseverate on  words and appeared significantly internally distracted which limited functional participation with language tasks.   SLP attempted to assess reading/writing. Pt exhibited difficulty understanding concept of tasks and required max A multimodal cues to follow 1-step directions. Pt was unable initiate written responses at the letter or word level. Pt traced letters and functional words with max A and-over-hand with limited carry over. Pt was eventually able to copy words x2 with 25-50% accuracy.   Pt did not seem to understand instructions to point to field of 2 words to assess reading comprehension at the word level instead would verbalize jargon. There were times pt verbalized words that associated with written field of choices. Pt with difficulty repeating CV and CVC words this date requiring total A.   Patient was left in bed with alarm activated and immediate needs within reach at end of session. MD at bedside. Continue per current plan of care.       Pain Pain Assessment Pain Scale: 0-10 Pain Score: 0-No pain Faces Pain Scale: No hurt  Therapy/Group: Individual Therapy  Nirav Sweda T Kaushal Vannice 02/15/2022, 1:27 PM

## 2022-02-15 NOTE — Progress Notes (Addendum)
Physical Therapy Session Note  Patient Details  Name: Linda [name] MRN: [id number] Date of Birth: [birth year]  Today's Date: 02/15/2022 PT Individual Time: 0800-0855 PT Individual Time Calculation (min): 55 min   Short Term Goals: Week 1:  PT Short Term Goal 1 (Week 1): pt will perform bed mobility with CGA overall PT Short Term Goal 2 (Week 1): pt will transfer bed<>chair with LRAD and supervision PT Short Term Goal 3 (Week 1): pt will ambulate 53f with LRAD and CGA Week 2:    Week 3:     Skilled Therapeutic Interventions/Progress Updates:   Pt received seated at edge of bed eating breakfast, distant supervision for balance.  Feeding herself, occasionally clearing throat. Coughs up blood tinged sputum at times. Examines and states "terrible" nodding head. Pt states "no" when therapist inquires re pain  Therapist uses gestures/single ste commands primarily to communicate.  Pt on 2L 02 via [place]  Sit to stand w/min assist to RW.  Gait 19fturn/sit to wc w/cga to light min assist, shuffling gait, downward gaze.  From wc level pt able to brush teeth, combs hair, and wipes down sink w/supervision and additional time/likely basiline/ only.  Transported to rehab apartment for functional reaching/standing balance task. Sit to stand from wc w/min assist.  Gait 64f28f/RW w/min to mod assist due to intermittent knee buckling,returned to sitting due to instability.  Pt repeating "please, please". In sitting pt has difficulty staying awake w/"nodding  off".  When asked "tired? W/gestures pt exclaimed "yes!".   Transported back to room. Sit to standf rom wc and gait 4ft72f head of bed w/min to mod assist, mild buckling.  Sit to supine w/use of rail, supervision, cues. Pt quickly falls asleep. 02 sats 99%, hr 54-63.  Noted tendency toward bradycardia in flowsheet. Respiratory Therapy arrived, discussed continued monitoring of vitals and pulse ox left on/RT agreed.  Pt left supine w/rails up x 3,  alarm set, bed in lowest position, and needs in reach.  Therapy Documentation Precautions:  Precautions Precautions: Fall, Other (comment) Precaution Comments: HOH, aphaisa Restrictions Weight Bearing Restrictions: No General:     Therapy/Group: Individual Therapy BarbCallie Fielding  [place]0/2023, 8:57 AM

## 2022-02-15 NOTE — Significant Event (Signed)
Rapid Response Event Note   Reason for Call :  HR 30s intermittently Code Status: DNR  Initial Focused Assessment:  Patient is on rehab s/p L MCA CVA, given TNK and to IR for thrombectomy.  She has also had some epistaxis and her Eliquis is currently on hold.    She has worked with therapy this am and has sat up at the side of the bed for breakfast.  She did have trazodone about 2130 last night.   BP 137/97  HR 30-60s  RR 16  O2 sat 99% on 2L [place],  oral temp 97.7 She will arouse to stimulation and moves all extremities.  She is very hard of hearing and is aphasic. Heart tones irregular Lung sounds clear   Interventions:  Labs drawn 12 lead EKG (AF)  (she is on 12.'5mg'$  lopressor BID and '25mg'$  cozaar qd)   Plan of Care:  Head CT Cardiology consult    Event Summary:   MD Notified: Linna Hoff PA and Dr Ranell Patrick at bedside Call Time: 0913 Arrival Time: 0915 End Time: [place]  Raliegh Ip, RN

## 2022-02-15 NOTE — Progress Notes (Signed)
RT Note:  During the nebulized treatment the patient's HR dropped to 36 bpm. RT called for RN to come to the bedside. Within a few minutes, the patient's HR dropped again to 36. RN called PA and RR RN called.

## 2022-02-15 NOTE — Progress Notes (Signed)
Physical Therapy Session Note  Patient Details  Name: Linda [name] MRN: [id number] Date of Birth: Feb 27, 1934  Today's Date: 02/15/2022 PT Individual Time: 1552-0802 PT Individual Time Calculation (min): 26 min  Today's Date: 02/15/2022 PT Missed Time: 19 Minutes Missed Time Reason: CT/MRI  Short Term Goals: Week 1:  PT Short Term Goal 1 (Week 1): pt will perform bed mobility with CGA overall PT Short Term Goal 2 (Week 1): pt will transfer bed<>chair with LRAD and supervision PT Short Term Goal 3 (Week 1): pt will ambulate 39f with LRAD and CGA  Skilled Therapeutic Interventions/Progress Updates:   Received pt semi-reclined in bed, patting at brief stating "this" indicating need to use bathroom. Session with emphasis on functional mobility/transfers, toileting, and generalized strengthening. Pt on RA with SPO2 94%. Pt transferred semi-reclined<>sitting EOB with HOB elevated and use of bedrails with min HHA and cues to place RLE on floor. Encouraged ambulating to bathroom but pt stating "no" and "yes" when therapist suggested bringing bedside commode over. Pt then began randomly hanging over RW and slumping forward repeating "please" and "honey". Stood with RW and min A and transferred to/from bedside commode with RW and min A. Pt required total A for clothing management and able to void. Doffed dirty brief and donned clean one then RN arrived and reported having to call rapid response this morning due to HR dropping into 30's (HR during therapy fluctuating from 55-73bpm, however pt did appear lethargic) - per MD, no therapy hold. RN reported plan for head CT and pt indicating desire to return to bed pointing at bed. Pt transferred sit<>supine with supervision with cues for attention to LLE. Concluded session with pt semi-reclined in bed, needs within reach, and bed alarm - HR 63bpm. 19 minutes missed of skilled physical therapy due to CT scan.   Therapy Documentation Precautions:   Precautions Precautions: Fall, Other (comment) Precaution Comments: HOH, aphaisa Restrictions Weight Bearing Restrictions: No  Therapy/Group: Individual Therapy AAlfonse AlpersPT, DPT  02/15/2022, 7:07 AM

## 2022-02-16 LAB — GLUCOSE, CAPILLARY
Glucose-Capillary: 145 mg/dL — ABNORMAL HIGH (ref 70–99)
Glucose-Capillary: 127 mg/dL — ABNORMAL HIGH (ref 70–99)

## 2022-02-16 NOTE — Progress Notes (Signed)
Speech Language Pathology Daily Session Note  Patient Details  Name: Linda [name] MRN: [id number] Date of Birth: 03-02-1934  Today's Date: 02/16/2022 SLP Individual Time: 0830-0900 SLP Individual Time Calculation (min): 30 min  Short Term Goals: Week 1: SLP Short Term Goal 1 (Week 1): Pt will repeat CVC, CV, VC word combinations with 60% accuracy given Max A multimodal cues.  - Did not formally address this session.  SLP Short Term Goal 2 (Week 1): Pt will utilize multimodal communication to convey wants and needs x 8 with Set-Up A and/or question prompts. - Pt utilized written choice prompts (field of 2-3) provided by SLP, use of low-tech AAC picture board, and yes/no questions to clarify wants and needs re: lunch and dinner selection and things that she wanted to complete (e.g. brushing teeth).  SLP Short Term Goal 3 (Week 1): Pt will answer environmental and semi-complex yes/no questions with 60% accuracy given Max A multimodal cues. - Pt answered yes/no questions re: environment and basic/factual information with ~80% accuracy given overall Min A verbal cues.   SLP Short Term Goal 4 (Week 1): Pt will follow one-step commands on 80% of opportunities given Mod A multimodal cues. - Pt followed one-step commands on ~80% of opportunities given Mod A verbal and visual cues.  SLP Short Term Goal 5 (Week 1): Pt will participate in further reading and writing assessment to aid in d/c planning and ST POC with 100% completion. - Did not formally address this session.  Skilled Therapeutic Interventions: S: Pt seen this date for skilled ST intervention targeting communication/language goals outlined above. Pt received visibly fatigued and lying in bed with eyes closed; aroused easily to name. Agreeable to ST intervention at bedside. DO present for morning rounds. Benefited Mod A multimodal cues to answer semi-complex yes/no questions for DO; unable to respond to questions prior to skilled intervention  techniques. Pt remains intermittently emotional re: current speech deficits; however, appeared to calm when provided with multimodal communication means.  O: Please see above for objective data re: pt's performance on targeted goals. Pt's verbal expression remains limited by severe non-fluent aphasia and verbal apraxia. Benefits from use of supportive communication interventions of yes/no questions, simple commands, use of gestures, and providing written choice prompts, low-tech AAC picture board, and elevated voice due to hearing loss. Able to communicate what she would like for lunch and dinner by pointing to written choice prompts and verbalizing to yes/no questions; prior to these skilled interventions, pt unable to accurately and consistently answer questions about preferred food and drinks, for upcoming meals, with food ambassador. Both parties appreciative of assistance.    A: Pt appears somewhat sitmulable for skilled ST intervention as evident by subtle improvement in communication of wants and needs with multimodal means of communication with Mod A multimodal cues; minimal stimulability for verbal expression interventions. Will plan to continue to trial low-tech AAC options next session.  P: Pt left in bed with all safety measures activated. Call bell reviewed and within reach and all immediate needs met. Continue per current ST POC next session.  Pain None/Denies; NAD  Therapy/Group: Individual Therapy  Any Mcneice A Marrisa Kimber 02/16/2022, 1:05 PM

## 2022-02-16 NOTE — Progress Notes (Addendum)
Rounding Note    Patient Name: Linda [name] Date of Encounter: 02/16/2022  [place] Cardiologist: None   Subjective   She appears drowsy today but responds to most questions. Denies pain, dizziness, lightheadedness or short of breath.   Inpatient Medications    Scheduled Meds:  aspirin  81 mg Oral Daily   cholecalciferol  1,000 Units Oral Daily   docusate sodium  100 mg Oral BID   ezetimibe  10 mg Oral Daily   FLUoxetine  10 mg Oral Daily   furosemide  20 mg Oral Daily   loratadine  10 mg Oral Daily   losartan  25 mg Oral Daily   metFORMIN  500 mg Oral Q breakfast   metoprolol succinate  12.5 mg Oral QHS   pantoprazole  40 mg Oral Q1200   potassium chloride SA  40 mEq Oral Daily   Continuous Infusions:  PRN Meds: acetaminophen **OR** acetaminophen (TYLENOL) oral liquid 160 mg/5 mL **OR** acetaminophen, saline, senna-docusate, sodium chloride, traZODone   Vital Signs    Vitals:   02/15/22 1602 02/15/22 1943 02/16/22 0405 02/16/22 0438  BP: 124/81 125/67 (!) 165/88   Pulse: (!) 49 64 65   Resp: '16 17 17   '$ Temp: 98.2 F (36.8 C) 98.7 F (37.1 C) 98.5 F (36.9 C)   TempSrc:      SpO2: 95% (!) 89% 93%   Weight:    73.7 kg  Height:        Intake/Output Summary (Last 24 hours) at 02/16/2022 0917 Last data filed at 02/16/2022 7262 Gross per 24 hour  Intake 680 ml  Output --  Net 680 ml      02/16/2022    4:38 AM 02/13/2022    6:08 PM 02/12/2022    5:00 AM  Last 3 Weights  Weight (lbs) 162 lb 7.7 oz 166 lb 14.2 oz 181 lb 14.1 oz  Weight (kg) 73.7 kg 75.7 kg 82.5 kg      Telemetry    NA - Personally Reviewed  ECG    NA - Personally Reviewed  Physical Exam  GEN: lethargic, no acute distress.   Neck: supple Cardiac: irregular rhythm, normal rate  Respiratory: normal work of breathing on room air, clear to auscultation bilaterally. MS: No edema Neuro:  severe aphasia Psych: Normal affect   Labs    High Sensitivity Troponin:    Recent Labs  Lab 02/05/22 0100  TROPONINIHS 23*     Chemistry Recent Labs  Lab 02/11/22 0609 02/11/22 1629 02/12/22 0535 02/13/22 0238 02/14/22 0515  NA 137  --  139 138 138  K 3.6  --  3.9 3.5 3.2*  CL 102  --  101 102 98  CO2 25  --  26 28 33*  GLUCOSE 148*  --  134* 102* 128*  BUN 9  --  '8 10 12  '$ CREATININE 0.71  --  0.68 0.66 0.82  CALCIUM 8.5*  --  8.9 8.8* 9.1  MG 2.2 2.0 1.8  --   --   PROT  --   --   --   --  5.6*  ALBUMIN  --   --   --   --  2.8*  AST  --   --   --   --  27  ALT  --   --   --   --  29  ALKPHOS  --   --   --   --  69  BILITOT  --   --   --   --  1.8*  GFRNONAA >60  --  >60 >60 >60  ANIONGAP 10  --  '12 8 7    '$ Lipids No results for input(s): "CHOL", "TRIG", "HDL", "LABVLDL", "LDLCALC", "CHOLHDL" in the last 168 hours.  Hematology Recent Labs  Lab 02/13/22 0238 02/14/22 0515 02/15/22 0918  WBC 6.5 6.0 5.8  RBC 4.28 4.51 4.36  HGB 14.0 14.9 14.5  HCT 41.4 44.1 42.5  MCV 96.7 97.8 97.5  MCH 32.7 33.0 33.3  MCHC 33.8 33.8 34.1  RDW 13.2 13.2 13.2  PLT 143* 169 178   Thyroid No results for input(s): "TSH", "FREET4" in the last 168 hours.  BNP Recent Labs  Lab 02/13/22 0238  BNP 509.1*    DDimer No results for input(s): "DDIMER" in the last 168 hours.   Radiology    CT HEAD WO CONTRAST (5MM)  Result Date: 02/15/2022 CLINICAL DATA:  Stroke.  Follow-up. EXAM: CT HEAD WITHOUT CONTRAST TECHNIQUE: Contiguous axial images were obtained from the base of the skull through the vertex without intravenous contrast. RADIATION DOSE REDUCTION: This exam was performed according to the departmental dose-optimization program which includes automated exposure control, adjustment of the mA and/or kV according to patient size and/or use of iterative reconstruction technique. COMPARISON:  02/10/2022 and previous FINDINGS: Brain: No acute brainstem finding. Old small vessel infarction of the right cerebellum appears unchanged. 1 cm intraparenchymal hemorrhage  within the right parietal white matter is unchanged. Chronic small-vessel ischemic changes otherwise affecting the right cerebral hemisphere. Subacute infarction of the left temporoparietal junction. Minimal petechial blood products appear questionably increased. No frank hematoma however. Slightly more regional swelling. One or 2 mm of left-to-right midline shift. Other small cortical and subcortical infarctions in the frontal and parietal region are barely visible by CT. No hydrocephalus. No extra-axial collection. Vascular: There is atherosclerotic calcification of the major vessels at the base of the brain. Skull: Negative Sinuses/Orbits: Chronic inflammatory changes of the right maxillary sinus. Orbits negative. Other: None IMPRESSION: No change in a 1 cm intraparenchymal hemorrhage in the right parietal white matter. Subacute infarction at the left temporoparietal junction as seen previously. There may be minimal increase in petechial bleeding in the region, but there is no measurable hematoma. Mild mass effect with left-to-right shift of 1-2 mm. Electronically Signed   By: Nelson Chimes M.D.   On: 02/15/2022 12:26    Cardiac Studies   8/30 EKG showed atrial fibrillation that is rate controlled with some T wave inversions.   Patient Profile     88 y.o. female with a hx of CVA, atrial fibrillation on Eliquis, severe MR s/p mitraclip 11/2020, HFpEF, HTN, CVA, HLD, T2DM who is being seen 02/15/2022 for the evaluation of bradycardia at the request of Dr. Leeroy Cha.  Assessment & Plan    Bradycardia HR in 30s while receiving tranexamic acid nebulizer yesterday. Asymptomatic. No episodes overnight or this morning. HR remains in 60s. Reduced total beta-blocker dose and switched to Toprol-XR. Bradycardia could be from vagal response during nebulizer treatment.  - tranexamic acid neb stopped by primary team - stopped lopressor yesterday and switched to Toprol-XR 12.5 mg at bedtime starting tonight    Atrial Fibrillation  Change to Toprol XL and reduced total beta-blocker dose to 12.5 mg. Rate controlled still.  - Toprol XL 12.5 mg at bedtime - ASA 81 mg daily - per neurology, transition back to Eliquis in 2-3 days if no more epistaxis or other bleeding.        For questions or  updates, please contact [place] Please consult [email or web address] for contact info under        Signed, Angelique Blonder, DO  02/16/2022, 9:17 AM      ATTENDING ATTESTATION:  After conducting a review of all available clinical information with the care team, interviewing the patient, and performing a physical exam, I agree with the findings and plan described in this note.   GEN: No acute distress.   HEENT:  MMM, no JVD, no scleral icterus Cardiac: RRR, no murmurs, rubs, or gallops.  Respiratory: Clear to auscultation bilaterally. GI: Soft, nontender, non-distended  MS: No edema; No deformity. Neuro:  Aphasic Vasc:  +2 radial pulses  Her HR has remained stable.  Starting Toprol XL tonight.  Monitor for now.  Restart Eliquis per neurology time course.  Lenna Sciara, MD Pager [phone]

## 2022-02-16 NOTE — Progress Notes (Signed)
PROGRESS NOTE   Subjective/Complaints: Updated daughter this morning regarding her heart rate drop yesterday, stopping nebulizer, cardiology's recommendation to change to toprol XL   ROS: denies pain, +fatigue   Objective:   CT HEAD WO CONTRAST (5MM)  Result Date: 02/15/2022 CLINICAL DATA:  Stroke.  Follow-up. EXAM: CT HEAD WITHOUT CONTRAST TECHNIQUE: Contiguous axial images were obtained from the base of the skull through the vertex without intravenous contrast. RADIATION DOSE REDUCTION: This exam was performed according to the departmental dose-optimization program which includes automated exposure control, adjustment of the mA and/or kV according to patient size and/or use of iterative reconstruction technique. COMPARISON:  02/10/2022 and previous FINDINGS: Brain: No acute brainstem finding. Old small vessel infarction of the right cerebellum appears unchanged. 1 cm intraparenchymal hemorrhage within the right parietal white matter is unchanged. Chronic small-vessel ischemic changes otherwise affecting the right cerebral hemisphere. Subacute infarction of the left temporoparietal junction. Minimal petechial blood products appear questionably increased. No frank hematoma however. Slightly more regional swelling. One or 2 mm of left-to-right midline shift. Other small cortical and subcortical infarctions in the frontal and parietal region are barely visible by CT. No hydrocephalus. No extra-axial collection. Vascular: There is atherosclerotic calcification of the major vessels at the base of the brain. Skull: Negative Sinuses/Orbits: Chronic inflammatory changes of the right maxillary sinus. Orbits negative. Other: None IMPRESSION: No change in a 1 cm intraparenchymal hemorrhage in the right parietal white matter. Subacute infarction at the left temporoparietal junction as seen previously. There may be minimal increase in petechial bleeding in  the region, but there is no measurable hematoma. Mild mass effect with left-to-right shift of 1-2 mm. Electronically Signed   By: Nelson Chimes M.D.   On: 02/15/2022 12:26    Recent Labs    02/14/22 0515 02/15/22 0918  WBC 6.0 5.8  HGB 14.9 14.5  HCT 44.1 42.5  PLT 169 178   Recent Labs    02/14/22 0515  NA 138  K 3.2*  CL 98  CO2 33*  GLUCOSE 128*  BUN 12  CREATININE 0.82  CALCIUM 9.1    Intake/Output Summary (Last 24 hours) at 02/16/2022 1104 Last data filed at 02/16/2022 9390 Gross per 24 hour  Intake 680 ml  Output --  Net 680 ml        Physical Exam: Vital Signs Blood pressure (!) 165/88, pulse 65, temperature 98.5 F (36.9 C), resp. rate 17, height '5\' 3"'$  (1.6 m), weight 73.7 kg, SpO2 93 %. Neurological:     Comments: Patient is alert.  She is very hard of hearing globally aphasic but answered simple questions appropriately.  She does mimic some simple commands.    General: No acute distress Mood and affect are appropriate Heart: Irreg Irreg no murmur, bradycardic Lungs: Clear to auscultation, breathing unlabored, no rales or wheezes, satting 92% on room air Abdomen: Positive bowel sounds, soft nontender to palpation, nondistended Extremities: No clubbing, cyanosis, or edema Skin: No evidence of breakdown, no evidence of rash Neurologic: Cranial nerves II through XII intact, motor strength is 5/5 in Left 4+/5 Right deltoid, bicep, tricep, grip, hip flexor, knee extensors, ankle dorsiflexor and plantar flexor Responding to questions  better than yesterday Sensory exam Withdraws to pinch Severe aphasia, unable to follow simple commands without gestural cues, per daughter also [facility]   Musculoskeletal: no jt deformities No joint swelling    Assessment/Plan: 1. Functional deficits which require 3+ hours per day of interdisciplinary therapy in a comprehensive inpatient rehab setting. Physiatrist is providing close team supervision and 24 hour management of active  medical problems listed below. Physiatrist and rehab team continue to assess barriers to discharge/monitor patient progress toward functional and medical goals  Care Tool:  Bathing    Body parts bathed by patient: Right arm, Left arm, Chest, Abdomen, Front perineal area, Buttocks, Right upper leg, Left upper leg, Face   Body parts bathed by helper: Right lower leg, Left lower leg     Bathing assist Assist Level: Minimal Assistance - Patient > 75%     Upper Body Dressing/Undressing Upper body dressing   What is the patient wearing?: Pull over shirt    Upper body assist Assist Level: Minimal Assistance - Patient > 75%    Lower Body Dressing/Undressing Lower body dressing      What is the patient wearing?: Incontinence brief, Pants     Lower body assist Assist for lower body dressing: Moderate Assistance - Patient 50 - 74%     Toileting Toileting    Toileting assist Assist for toileting: Maximal Assistance - Patient 25 - 49% (simulated during bathing/dressing)     Transfers Chair/bed transfer  Transfers assist  Chair/bed transfer activity did not occur: Safety/medical concerns  Chair/bed transfer assist level: Contact Guard/Touching assist     Locomotion Ambulation   Ambulation assist      Assist level: Minimal Assistance - Patient > 75% Assistive device: Walker-rolling Max distance: 6f   Walk 10 feet activity   Assist     Assist level: Minimal Assistance - Patient > 75% Assistive device: Walker-rolling   Walk 50 feet activity   Assist Walk 50 feet with 2 turns activity did not occur: Safety/medical concerns (fatigue, weakness/deconditioning)         Walk 150 feet activity   Assist Walk 150 feet activity did not occur: Safety/medical concerns (fatigue, weakness/deconditioning)         Walk 10 feet on uneven surface  activity   Assist Walk 10 feet on uneven surfaces activity did not occur: Safety/medical concerns (fatigue,  weakness/deconditioning)         Wheelchair     Assist Is the patient using a wheelchair?: Yes Type of Wheelchair: Manual Wheelchair activity did not occur: Safety/medical concerns (fatigue, weakness/deconditioning)         Wheelchair 50 feet with 2 turns activity    Assist    Wheelchair 50 feet with 2 turns activity did not occur: Safety/medical concerns (fatigue, weakness/deconditioning)       Wheelchair 150 feet activity     Assist  Wheelchair 150 feet activity did not occur: Safety/medical concerns (fatigue, weakness/deconditioning)       Blood pressure (!) 165/88, pulse 65, temperature 98.5 F (36.9 C), resp. rate 17, height '5\' 3"'$  (1.6 m), weight 73.7 kg, SpO2 93 %.  Medical Problem List and Plan: 1. Functional deficits secondary to left MCA infarct/M3 occlusion status post TNK with revascularization.  ICH right frontal small hematoma likely hemorrhagic transformation status post TNK..Marland KitchenOLD ELIQUIS FOR 5-7 DAYS THEN RESUME IF NO MORE NOSEBLEEDS.                -patient may  shower             -  ELOS/Goals: 15 days minA  Updated daughter 2.  Antithrombotics: -DVT/anticoagulation:  Mechanical: Antiembolism stockings, thigh (TED hose) Bilateral lower extremities             -antiplatelet therapy: Aspirin 81 mg daily 3. Pain Management: Tylenol as needed 4. Mood/Behavior/Sleep: Prozac 10 mg daily, trazodone as needed             -antipsychotic agents: N/A 5. Neuropsych/cognition: This patient is not capable of making decisions on her own behalf. 6. Skin/Wound Care: Routine skin checks 7. Fluids/Electrolytes/Nutrition: Routine in and outs with follow-up chemistries 8.  Atrial fibrillation.  Chronic Eliquis on hold.  Lopressor 12.5 mg twice daily, Cozaar 25 mg daily.  Cardiac rate controlled 9.  History of severe mitral regurgitation status post MitraClip 11/2020 10.  Diastolic congestive heart failure.  Lasix 20 mg daily.  Monitor for any signs of fluid  overload 11.  Diabetes mellitus.  Hemoglobin A1c 6.6.  SSI.  Patient on Farxiga 5 mg daily prior to admission.  Resume as needed 12.  Hyperlipidemia.  Zetia 13.  GERD.  Protonix 14.  Epistaxis.  Packed by Dr. Hope Budds while at [facility] 15. Screening for vitamin D deficiency: add vitamin D level to today's labs 16. Hypokalemia: increase daily potassium supplement to 67mq daily 17. New left lung opacity which could be developing pneumonia: check procalcitonin level.  18. Bradycardia: resolved d/ced the nebulizer treatments, likely due to vagal response from treatments 19. Sputum with flecks of blood: continue to hold Eliquis for 2-3 more days. Asked nursing to tell me if they note any more blood in sputum.  20. Lethargy improved: consulted cardiology given associated bradycardia, Toprol XL changed to HS  LOS: 3 days A FACE TO FACE EVALUATION WAS PERFORMED  Linda [name] 02/16/2022, 11:04 AM

## 2022-02-16 NOTE — Progress Notes (Signed)
Occupational Therapy Session Note  Patient Details  Name: Linda [name] MRN: [id number] Date of Birth: 05/17/1934  Today's Date: 02/16/2022 OT Individual Time: 1950-9326 OT Individual Time Calculation (min): 27 min  Pt missed 47 mins d/t therapist running late from previous session and pt fatigue/ declined   Short Term Goals: Week 1:  OT Short Term Goal 1 (Week 1): Pt will complete 1/3 toileting steps with no more than CGA for balance OT Short Term Goal 2 (Week 1): Pt will complete LB dressing with CGA OT Short Term Goal 3 (Week 1): Pt will imrpove room air cardiorespiratory endurance for 2 consecutive sessions with SPO2 WNLs  Skilled Therapeutic Interventions/Progress Updates:  Pt greeted supine in bed with team PT present attempting to make up missed minutes.  Pt able to state "yes" to her name with 3 choices provided pt initially declines session, speaking in mostly word salad but making efforts to conversate. Pt on RA with SpO2 98% HR 88.  Pt eventually able to state needing to urinate. Pt completed supine>sit with CGA and able to don fresh socks with MINA. Pt completed sit>stand from EOB with Rw and MINA. Pt asked to ambulate into bathroom with Rw however pt states no and sits self back down.  Pt agreeable to pivot to [facility], pt completed stand pivot to [facility] with RW and CGA.  Pt completed 3/3 toileting tasks with MIN A needing assist for clothing mgmt but able to complete anterior/posterior pericare. Pt required assist also for cognition/safety as pt unable aware small amount of stool present in brief.  Pt donned button up shirt from [facility] with MINA, but able to button all buttons with + time.  Pt completed LB dressing with MOD A needing assist to thread brief and RLE into pants.  Requested pt walk to chair however pt pivoted to bed instead with Rw and CGA. Pt declined any further ADLs/ functional mobility waving therapist away to leave.  Pt left supine in bed with family present and all needs  within reach.                  Therapy Documentation Precautions:  Precautions Precautions: Fall, Other (comment) Precaution Comments: HOH, aphaisa Restrictions Weight Bearing Restrictions: No  Pain: no pain indicated during session     Therapy/Group: Individual Therapy  Precious Haws 02/16/2022, 3:16 PM

## 2022-02-16 NOTE — IPOC Note (Signed)
Overall Plan of Care [facility]) Patient Details Name: Linda [name] MRN: [id number] DOB: 02-Jun-1934  Admitting Diagnosis: Left middle cerebral artery stroke [facility])  Hospital Problems: Principal Problem:   Left middle cerebral artery stroke [facility])     Functional Problem List: Nursing Bladder, Bowel, Perception, Edema, Safety, Endurance, Sensory, Medication Management, Skin Integrity, Motor  PT Balance, Edema, Endurance, Motor, Nutrition, Safety, Skin Integrity  OT Balance, Cognition, Endurance, Pain, Safety  SLP Linguistic, Motor  TR         Basic ADL's: OT Bathing, Dressing, Toileting     Advanced  ADL's: OT Simple Meal Preparation     Transfers: PT Bed Mobility, Bed to Chair, Car, Manufacturing systems engineer, Metallurgist: PT Ambulation, Emergency planning/management officer, Stairs     Additional Impairments: OT    SLP Swallowing, Communication comprehension, expression    TR      Anticipated Outcomes Item Anticipated Outcome  Self Feeding Indep  Swallowing  N/A - tolerating regular diet with thin liquids   Basic self-care  Mod to supervision  Toileting  Supervision   Bathroom Transfers Supervision  Bowel/Bladder  to be continent x 2  Transfers  Mod I with LRAD  Locomotion  supervision with LRAD  Communication  Min A for auditory comprehension; Mod-Max A for verbal expression  Cognition  N/A - difficult to assess at this time  Pain  less than 3  Safety/Judgment  remain fall free while in rehab   Therapy Plan: PT Intensity: Minimum of 1-2 x/day ,45 to 90 minutes PT Frequency: 5 out of 7 days PT Duration Estimated Length of Stay: 12-14 days OT Intensity: Minimum of 1-2 x/day, 45 to 90 minutes OT Frequency: 5 out of 7 days OT Duration/Estimated Length of Stay: 10-12 days SLP Intensity: Minumum of 1-2 x/day, 30 to 90 minutes SLP Frequency: 3 to 5 out of 7 days SLP Duration/Estimated Length of Stay: 12-14 days   Team Interventions: Nursing Interventions  Patient/Family Education, Disease Management/Prevention, Skin Care/Wound Management, Discharge Planning, Bladder Management, Bowel Management, Medication Management  PT interventions Ambulation/gait training, Discharge planning, Functional mobility training, Psychosocial support, Therapeutic Activities, Visual/perceptual remediation/compensation, Balance/vestibular training, Disease management/prevention, Neuromuscular re-education, Skin care/wound management, Therapeutic Exercise, Wheelchair propulsion/positioning, Cognitive remediation/compensation, DME/adaptive equipment instruction, Pain management, Splinting/orthotics, UE/LE Strength taining/ROM, Academic librarian, Barrister's clerk education, IT trainer, UE/LE Coordination activities  OT Interventions Training and development officer, Discharge planning, Pain management, Self Care/advanced ADL retraining, Therapeutic Activities, UE/LE Coordination activities, Cognitive remediation/compensation, Functional mobility training, Patient/family education, Therapeutic Exercise, Community reintegration, Engineer, drilling, Neuromuscular re-education, Psychosocial support, UE/LE Strength taining/ROM  SLP Interventions Cueing hierarchy, Speech/Language facilitation, Patient/family education, Therapeutic Activities  TR Interventions    SW/CM Interventions Discharge Planning, Psychosocial Support, Patient/Family Education, Disease Management/Prevention   Barriers to Discharge MD  Medical stability  Nursing Incontinence, Wound Care, Lack of/limited family support    PT Decreased caregiver support, New oxygen pt may require intermittent supervision upon D/C, aphasia, new O2  OT Incontinence, New oxygen    SLP      SW New oxygen, Wound Care, Incontinence, Lack of/limited family support, Decreased caregiver support     Team Discharge Planning: Destination: PT- ([facility]) ,OT- Assisted Living (ILF) , SLP- (ILF) Projected Follow-up:  PT-Home health PT, OT-  Home health OT, SLP-Home Health SLP, 24 hour supervision/assistance Projected Equipment Needs: PT-To be determined, OT- To be determined, SLP-None recommended by SLP Equipment Details: PT-unsure what kind of walker pt has (RW vs rollator), OT-  Patient/family involved in discharge  planning: PT- Patient,  OT-Patient, SLP-Patient  MD ELOS: 15 days Medical Rehab Prognosis:  Excellent Assessment: The patient has been admitted for CIR therapies with the diagnosis of left MCA CVA. The team will be addressing functional mobility, strength, stamina, balance, safety, adaptive techniques and equipment, self-care, bowel and bladder mgt, patient and caregiver education. Goals have been set at supervision. Anticipated discharge destination is home.        See Team Conference Notes for weekly updates to the plan of care

## 2022-02-16 NOTE — Progress Notes (Signed)
Patient ID: Linda [name], female   DOB: 01/10/1934, 88 y.o.   MRN: [id number]  Sw left detail VM for patient daughter.

## 2022-02-16 NOTE — Progress Notes (Signed)
Physical Therapy Note  Patient Details  Name: Linda [name] MRN: [id number] Date of Birth: [birth year] Today's Date: 02/16/2022  Attempted to see pt to makeup missed minutes from this morning.  Pt asleep in bed and upon arousing, declined participation in session due to fatigue. Offered going to gym, ambulating in room, and getting to recliner but pt continued to refuse - OTA then arrived for scheduled session.   Briefly spoke with daughter and daughter requesting pt have more therapy in AM, reporting pt usually naps between 1-3 - notified scheduling. Also discussed pt's CLOF as pt's daughter asking if pt was appropriate for assisted living. Informed daughter that if participation and activity tolerance/endurance improves pt may be a candidate for assisted living.  [place], DPT  02/16/2022, 2:39 PM

## 2022-02-16 NOTE — Progress Notes (Signed)
Physical Therapy Session Note  Patient Details  Name: Linda [name] MRN: [id number] Date of Birth: [birth year]  Today's Date: 02/16/2022 PT Individual Time: 3825-0539 PT Individual Time Calculation (min): 28 min   Today's Date: 02/16/2022 PT Missed Time: 47 Minutes Missed Time Reason: Patient unwilling to participate;Patient fatigue  Short Term Goals: Week 1:  PT Short Term Goal 1 (Week 1): pt will perform bed mobility with CGA overall PT Short Term Goal 2 (Week 1): pt will transfer bed<>chair with LRAD and supervision PT Short Term Goal 3 (Week 1): pt will ambulate 63f with LRAD and CGA  Skilled Therapeutic Interventions/Progress Updates:   Received pt semi-reclined in bed, pt with continued expressive aphasia but indicating wanting door open. Pulled WC to bedside to set up transfer to get OOB, however pt began stating "no, please no" and began crying but unable to communicate why. [facility] however pt continued to state "no" and started crying more; then suggested getting to recliner and pt agreed.   Pt transferred semi-reclined<>sitting EOB with HOB elevated and supervision using bedrails and stood with RW and CGA and transferred into recliner with RW and CGA. Pt appeared comfortable once getting up; therefore encouraged going for a walk, however pt declined stating "no" and repeating "please, no, honey" - but was agreeable to seated exercises. Pt performed LAQ x5 bilaterally and hip flexion x5 bilaterally but noted pt to be falling in/out of sleep and unable to functionally participate any further - assessed BP: 146/96.  Pt pointing back at bed, however reminded pt of benefits of staying OOB and encouraged pt to stay in recliner at least until bed linens could be changed. Concluded session with pt sitting in recliner with pillows and blankets, needs within reach, and seatbelt alarm on. RN present at bedside. 47 minutes missed of skilled physical therapy due to  fatigue and refusal to participate.   Therapy Documentation Precautions:  Precautions Precautions: Fall, Other (comment) Precaution Comments: HOH, aphaisa Restrictions Weight Bearing Restrictions: No  Therapy/Group: Individual Therapy AAlfonse AlpersPT, DPT  02/16/2022, 7:01 AM

## 2022-02-17 LAB — CBC WITH DIFFERENTIAL/PLATELET
Abs Immature Granulocytes: 0.01 10*3/uL (ref 0.00–0.07)
Basophils Absolute: 0.1 10*3/uL (ref 0.0–0.1)
Basophils Relative: 1 %
Eosinophils Absolute: 0.1 10*3/uL (ref 0.0–0.5)
Eosinophils Relative: 2 %
HCT: 44.8 % (ref 36.0–46.0)
Hemoglobin: 15.2 g/dL — ABNORMAL HIGH (ref 12.0–15.0)
Immature Granulocytes: 0 %
Lymphocytes Relative: 23 %
Lymphs Abs: 1.1 10*3/uL (ref 0.7–4.0)
MCH: 32.7 pg (ref 26.0–34.0)
MCHC: 33.9 g/dL (ref 30.0–36.0)
MCV: 96.3 fL (ref 80.0–100.0)
Monocytes Absolute: 0.6 10*3/uL (ref 0.1–1.0)
Monocytes Relative: 12 %
Neutro Abs: 3.1 10*3/uL (ref 1.7–7.7)
Neutrophils Relative %: 62 %
Platelets: 216 10*3/uL (ref 150–400)
RBC: 4.65 MIL/uL (ref 3.87–5.11)
RDW: 13.3 % (ref 11.5–15.5)
WBC: 4.9 10*3/uL (ref 4.0–10.5)
nRBC: 0 % (ref 0.0–0.2)

## 2022-02-17 LAB — BASIC METABOLIC PANEL
Anion gap: 11 (ref 5–15)
BUN: 12 mg/dL (ref 8–23)
CO2: 29 mmol/L (ref 22–32)
Calcium: 9.3 mg/dL (ref 8.9–10.3)
Chloride: 97 mmol/L — ABNORMAL LOW (ref 98–111)
Creatinine, Ser: 0.81 mg/dL (ref 0.44–1.00)
GFR, Estimated: >60 mL/min (ref >60)
Glucose, Bld: 174 mg/dL — ABNORMAL HIGH (ref 70–99)
Potassium: 3.5 mmol/L (ref 3.5–5.1)
Sodium: 137 mmol/L (ref 135–145)

## 2022-02-17 LAB — GLUCOSE, CAPILLARY
Glucose-Capillary: 134 mg/dL — ABNORMAL HIGH (ref 70–99)
Glucose-Capillary: 165 mg/dL — ABNORMAL HIGH (ref 70–99)
Glucose-Capillary: 156 mg/dL — ABNORMAL HIGH (ref 70–99)

## 2022-02-17 LAB — PROCALCITONIN: Procalcitonin: <0.1 ng/mL

## 2022-02-17 MED ORDER — TRAZODONE HCL 50 MG PO TABS
25.0000 mg | ORAL_TABLET | Freq: Every evening | ORAL | Status: DC | PRN
Start: 2022-02-17 — End: 2022-02-19
  Filled 2022-02-17 (×2): qty 1

## 2022-02-17 MED ORDER — MAGNESIUM GLUCONATE 500 MG PO TABS
250.0000 mg | ORAL_TABLET | Freq: Every day | ORAL | Status: DC
  Administered 2022-02-17 – 2022-02-26 (×8): 250 mg via ORAL
  Filled 2022-02-17 (×10): qty 1

## 2022-02-17 MED ORDER — B COMPLEX-C PO TABS
1.0000 | ORAL_TABLET | Freq: Every day | ORAL | Status: DC
  Administered 2022-02-17 – 2022-02-22 (×6): 1 via ORAL
  Filled 2022-02-17 (×10): qty 1

## 2022-02-17 MED ORDER — POTASSIUM CHLORIDE 20 MEQ PO PACK
40.0000 meq | PACK | Freq: Every day | ORAL | Status: DC
Start: 2022-02-17 — End: 2022-02-22
  Administered 2022-02-17 – 2022-02-22 (×6): 40 meq via ORAL
  Filled 2022-02-17 (×6): qty 2

## 2022-02-17 NOTE — Progress Notes (Signed)
PROGRESS NOTE   Subjective/Complaints: No new complaints this morning Very sleepy in the morning, but becomes more alert when working with therapy, missed therapy today due to fatigue   ROS: denies pain, +fatigue   Objective:   No results found.  Recent Labs    02/15/22 0918 02/17/22 0855  WBC 5.8 4.9  HGB 14.5 15.2*  HCT 42.5 44.8  PLT 178 216   Recent Labs    02/17/22 0855  NA 137  K 3.5  CL 97*  CO2 29  GLUCOSE 174*  BUN 12  CREATININE 0.81  CALCIUM 9.3    Intake/Output Summary (Last 24 hours) at 02/17/2022 1411 Last data filed at 02/16/2022 1700 Gross per 24 hour  Intake 240 ml  Output --  Net 240 ml        Physical Exam: Vital Signs Blood pressure (!) 122/103, pulse 60, temperature 97.9 F (36.6 C), resp. rate 16, height '5\' 3"'$  (1.6 m), weight 73.2 kg, SpO2 95 %. Neurological:     Comments: Patient is alert.  She is very hard of hearing globally aphasic but answered simple questions appropriately.  She does mimic some simple commands.    General: No acute distress Mood and affect are appropriate Heart: Irreg Irreg no murmur, bradycardic Lungs: Clear to auscultation, breathing unlabored, no rales or wheezes, satting 92% on room air Abdomen: Positive bowel sounds, soft nontender to palpation, nondistended Extremities: No clubbing, cyanosis, or edema Skin: No evidence of breakdown, no evidence of rash Neurologic: Cranial nerves II through XII intact, motor strength is 5/5 in Left 4+/5 Right deltoid, bicep, tricep, grip, hip flexor, knee extensors, ankle dorsiflexor and plantar flexor Responding to questions better than yesterday Sensory exam Withdraws to pinch Severe aphasia, unable to follow simple commands without gestural cues, per daughter also HOH   Musculoskeletal: no jt deformities No joint swelling  Psych: anxious and agitated at times   Assessment/Plan: 1. Functional deficits which  require 3+ hours per day of interdisciplinary therapy in a comprehensive inpatient rehab setting. Physiatrist is providing close team supervision and 24 hour management of active medical problems listed below. Physiatrist and rehab team continue to assess barriers to discharge/monitor patient progress toward functional and medical goals  Care Tool:  Bathing    Body parts bathed by patient: Right arm, Left arm, Chest, Abdomen, Front perineal area, Buttocks, Right upper leg, Left upper leg, Face   Body parts bathed by helper: Right lower leg, Left lower leg     Bathing assist Assist Level: Minimal Assistance - Patient > 75%     Upper Body Dressing/Undressing Upper body dressing   What is the patient wearing?: Button up shirt    Upper body assist Assist Level: Minimal Assistance - Patient > 75%    Lower Body Dressing/Undressing Lower body dressing      What is the patient wearing?: Incontinence brief, Pants     Lower body assist Assist for lower body dressing: Moderate Assistance - Patient 50 - 74%     Toileting Toileting    Toileting assist Assist for toileting: Minimal Assistance - Patient > 75%     Transfers Chair/bed transfer  Transfers assist  Chair/bed  transfer activity did not occur: Safety/medical concerns  Chair/bed transfer assist level: Contact Guard/Touching assist     Locomotion Ambulation   Ambulation assist      Assist level: Minimal Assistance - Patient > 75% Assistive device: Walker-rolling Max distance: 12f   Walk 10 feet activity   Assist     Assist level: Minimal Assistance - Patient > 75% Assistive device: Walker-rolling   Walk 50 feet activity   Assist Walk 50 feet with 2 turns activity did not occur: Safety/medical concerns (fatigue, weakness/deconditioning)         Walk 150 feet activity   Assist Walk 150 feet activity did not occur: Safety/medical concerns (fatigue, weakness/deconditioning)         Walk 10  feet on uneven surface  activity   Assist Walk 10 feet on uneven surfaces activity did not occur: Safety/medical concerns (fatigue, weakness/deconditioning)         Wheelchair     Assist Is the patient using a wheelchair?: Yes Type of Wheelchair: Manual Wheelchair activity did not occur: Safety/medical concerns (fatigue, weakness/deconditioning)         Wheelchair 50 feet with 2 turns activity    Assist    Wheelchair 50 feet with 2 turns activity did not occur: Safety/medical concerns (fatigue, weakness/deconditioning)       Wheelchair 150 feet activity     Assist  Wheelchair 150 feet activity did not occur: Safety/medical concerns (fatigue, weakness/deconditioning)       Blood pressure (!) 122/103, pulse 60, temperature 97.9 F (36.6 C), resp. rate 16, height '5\' 3"'$  (1.6 m), weight 73.2 kg, SpO2 95 %.  Medical Problem List and Plan: 1. Functional deficits secondary to left MCA infarct/M3 occlusion status post TNK with revascularization.  ICH right frontal small hematoma likely hemorrhagic transformation status post TNK..Marland Kitchenesume Eliquis in 1-2 days.              -patient may  shower             -ELOS/Goals: 15 days minA  Updated daughter  Start B complex with vitamin C tablet 2.  Antithrombotics: -DVT/anticoagulation:  Mechanical: Antiembolism stockings, thigh (TED hose) Bilateral lower extremities             -antiplatelet therapy: Aspirin 81 mg daily 3. Pain Management: Tylenol as needed 4. Mood/Behavior/Sleep: Prozac 10 mg daily, trazodone as needed             -antipsychotic agents: N/A 5. Neuropsych/cognition: This patient is not capable of making decisions on her own behalf. 6. Skin/Wound Care: Routine skin checks 7. Fluids/Electrolytes/Nutrition: Routine in and outs with follow-up chemistries 8.  Atrial fibrillation.  Chronic Eliquis on hold.  Lopressor 12.5 mg twice daily, Cozaar 25 mg daily.  Cardiac rate controlled 9.  History of severe mitral  regurgitation status post MitraClip 11/2020 10.  Diastolic congestive heart failure.  Lasix 20 mg daily.  Monitor for any signs of fluid overload 11.  Diabetes mellitus.  Hemoglobin A1c 6.6.  SSI.  Patient on Farxiga 5 mg daily prior to admission.  Resume as needed 12.  Hyperlipidemia.  Zetia 13.  GERD.  Protonix 14.  Epistaxis.  [facility]. Suboptimal vitamin D: start 1,000U daily 16. Hypokalemia: increase daily potassium supplement to 426m daily, repeat potassium on Monday 17. New left lung opacity which could be developing pneumonia: check procalcitonin level.  18. Bradycardia: resolved d/ced the nebulizer treatments, likely due to vagal response from treatments.  19. Sputum  with flecks of blood: continue to hold Eliquis for 2-3 more days. Asked nursing to tell me if they note any more blood in sputum.  20. Lethargy improved: consulted cardiology given associated bradycardia, Toprol XL changed to HS  LOS: 4 days A FACE TO FACE EVALUATION WAS PERFORMED  Linda [name] 02/17/2022, 2:11 PM

## 2022-02-17 NOTE — Progress Notes (Signed)
Speech Language Pathology Daily Session Note  Patient Details  Name: Linda [name] MRN: [id number] Date of Birth: [birth year]  Today's Date: 02/17/2022 SLP Individual Time: 2094-7096 SLP Individual Time Calculation (min): 39 min and Today's Date: 02/17/2022 SLP Missed Time: 21 Minutes Missed Time Reason: Patient fatigue;Patient unwilling to participate  Short Term Goals: Week 1: SLP Short Term Goal 1 (Week 1): Pt will repeat CVC, CV, VC word combinations with 60% accuracy given Max A multimodal cues. SLP Short Term Goal 2 (Week 1): Pt will utilize multimodal communication to convey wants and needs x 8 with Set-Up A and/or question prompts. SLP Short Term Goal 3 (Week 1): Pt will answer environmental and semi-complex yes/no questions with 60% accuracy given Max A multimodal cues. SLP Short Term Goal 4 (Week 1): Pt will follow one-step commands on 80% of opportunities given Mod A multimodal cues. SLP Short Term Goal 5 (Week 1): Pt will participate in further reading and writing assessment to aid in d/c planning and ST POC with 100% completion.  Skilled Therapeutic Interventions: Pt seen for skilled ST with focus on speech and language goals, pt sitting in wheelchair and appearing very anxious/agitated and moaning "oh hunny". With extra time patient able to communicate via gestures she wanted water and tissues which SLP provided. Once patient more calm, SLP attempting to utilize AAC board in room to help patient communicate wants/needs, however pt appears very internally and externally distracted wanting to get into bed. With max A cues and redirection, pt repeating VC words on 2/10 opportunities, again too distracted by wanting to get into bed. SLP facilitating transfer by providing mod A verbal cues for following 1-step directions during functional task to increase safety. Once patient in bed, requiring max-total A to continue participation in tasks, responding to a few yes/no questions before falling  into deep sleep. Pt missed 21 minutes of therapy d/t fatigue/unwilling to participate. Pt left in bed sleeping soundly with bed alarm on and all needs within reach, cont ST POC.  Pain Pain Assessment Pain Scale: 0-10 Pain Score: 0-No pain  Therapy/Group: Individual Therapy  Dewaine Conger 02/17/2022, 1:44 PM

## 2022-02-17 NOTE — Progress Notes (Addendum)
Rounding Note    Patient Name: Linda [name] Date of Encounter: 02/17/2022  [place] Cardiologist: None   Subjective   Patient is more drowsy this morning. Can be aroused to answer some questions. Per nursing, no overnight events and she ate some of her breakfast earlier.   Inpatient Medications    Scheduled Meds:  aspirin  81 mg Oral Daily   cholecalciferol  1,000 Units Oral Daily   docusate sodium  100 mg Oral BID   ezetimibe  10 mg Oral Daily   FLUoxetine  10 mg Oral Daily   furosemide  20 mg Oral Daily   loratadine  10 mg Oral Daily   losartan  25 mg Oral Daily   metFORMIN  500 mg Oral Q breakfast   metoprolol succinate  12.5 mg Oral QHS   pantoprazole  40 mg Oral Q1200   potassium chloride SA  40 mEq Oral Daily   Continuous Infusions:  PRN Meds: acetaminophen **OR** acetaminophen (TYLENOL) oral liquid 160 mg/5 mL **OR** acetaminophen, saline, senna-docusate, sodium chloride, traZODone   Vital Signs    Vitals:   02/16/22 0405 02/16/22 0438 02/16/22 2001 02/17/22 0435  BP: (!) 165/88  (!) 151/81 (!) 155/80  Pulse: 65  64 78  Resp: '17  16 16  '$ Temp: 98.5 F (36.9 C)  98.2 F (36.8 C) 98.3 F (36.8 C)  TempSrc:   Oral   SpO2: 93%  93% 93%  Weight:  73.7 kg  73.2 kg  Height:        Intake/Output Summary (Last 24 hours) at 02/17/2022 0834 Last data filed at 02/16/2022 1700 Gross per 24 hour  Intake 240 ml  Output --  Net 240 ml       02/17/2022    4:35 AM 02/16/2022    4:38 AM 02/13/2022    6:08 PM  Last 3 Weights  Weight (lbs) 161 lb 6 oz 162 lb 7.7 oz 166 lb 14.2 oz  Weight (kg) 73.2 kg 73.7 kg 75.7 kg      Telemetry    NA - Personally Reviewed  ECG    NA - Personally Reviewed  Physical Exam  GEN: somnolent, no acute distress.   Neck: supple Cardiac: irregular rhythm, normal rate  Respiratory: normal work of breathing on room air MS: No edema Neuro:  severe aphasia Psych: Normal affect   Labs    High Sensitivity Troponin:    Recent Labs  Lab 02/05/22 0100  TROPONINIHS 23*      Chemistry Recent Labs  Lab 02/11/22 0609 02/11/22 1629 02/12/22 0535 02/13/22 0238 02/14/22 0515  NA 137  --  139 138 138  K 3.6  --  3.9 3.5 3.2*  CL 102  --  101 102 98  CO2 25  --  26 28 33*  GLUCOSE 148*  --  134* 102* 128*  BUN 9  --  '8 10 12  '$ CREATININE 0.71  --  0.68 0.66 0.82  CALCIUM 8.5*  --  8.9 8.8* 9.1  MG 2.2 2.0 1.8  --   --   PROT  --   --   --   --  5.6*  ALBUMIN  --   --   --   --  2.8*  AST  --   --   --   --  27  ALT  --   --   --   --  29  ALKPHOS  --   --   --   --  60  BILITOT  --   --   --   --  1.8*  GFRNONAA >60  --  >60 >60 >60  ANIONGAP 10  --  '12 8 7     '$ Lipids No results for input(s): "CHOL", "TRIG", "HDL", "LABVLDL", "LDLCALC", "CHOLHDL" in the last 168 hours.  Hematology Recent Labs  Lab 02/13/22 0238 02/14/22 0515 02/15/22 0918  WBC 6.5 6.0 5.8  RBC 4.28 4.51 4.36  HGB 14.0 14.9 14.5  HCT 41.4 44.1 42.5  MCV 96.7 97.8 97.5  MCH 32.7 33.0 33.3  MCHC 33.8 33.8 34.1  RDW 13.2 13.2 13.2  PLT 143* 169 178    Thyroid No results for input(s): "TSH", "FREET4" in the last 168 hours.  BNP Recent Labs  Lab 02/13/22 0238  BNP 509.1*     DDimer No results for input(s): "DDIMER" in the last 168 hours.   Radiology    CT HEAD WO CONTRAST (5MM)  Result Date: 02/15/2022 CLINICAL DATA:  Stroke.  Follow-up. EXAM: CT HEAD WITHOUT CONTRAST TECHNIQUE: Contiguous axial images were obtained from the base of the skull through the vertex without intravenous contrast. RADIATION DOSE REDUCTION: This exam was performed according to the departmental dose-optimization program which includes automated exposure control, adjustment of the mA and/or kV according to patient size and/or use of iterative reconstruction technique. COMPARISON:  02/10/2022 and previous FINDINGS: Brain: No acute brainstem finding. Old small vessel infarction of the right cerebellum appears unchanged. 1 cm intraparenchymal  hemorrhage within the right parietal white matter is unchanged. Chronic small-vessel ischemic changes otherwise affecting the right cerebral hemisphere. Subacute infarction of the left temporoparietal junction. Minimal petechial blood products appear questionably increased. No frank hematoma however. Slightly more regional swelling. One or 2 mm of left-to-right midline shift. Other small cortical and subcortical infarctions in the frontal and parietal region are barely visible by CT. No hydrocephalus. No extra-axial collection. Vascular: There is atherosclerotic calcification of the major vessels at the base of the brain. Skull: Negative Sinuses/Orbits: Chronic inflammatory changes of the right maxillary sinus. Orbits negative. Other: None IMPRESSION: No change in a 1 cm intraparenchymal hemorrhage in the right parietal white matter. Subacute infarction at the left temporoparietal junction as seen previously. There may be minimal increase in petechial bleeding in the region, but there is no measurable hematoma. Mild mass effect with left-to-right shift of 1-2 mm. Electronically Signed   By: Nelson Chimes M.D.   On: 02/15/2022 12:26    Cardiac Studies   8/30 EKG showed atrial fibrillation that is rate controlled with some T wave inversions.   Patient Profile     88 y.o. female with a hx of CVA, atrial fibrillation on Eliquis, severe MR s/p mitraclip 11/2020, HFpEF, HTN, CVA, HLD, T2DM who is being seen 02/15/2022 for the evaluation of bradycardia at the request of Dr. Leeroy Cha.  Assessment & Plan    Bradycardia One noted episode of asymptomatic bradycardia during tranexamic acid nebulizer treatment. Bradycardia could be from vagal response during nebulizer treatment. No documented episodes of bradycardia. HR in ~70. BP remains elevated. More somnolent on exam but can be aroused to answer a few questions. Nursing noted she was able to eat her breakfast.  - tranexamic acid neb stopped by primary  team - on Toprol-XR 12.5 mg at bedtime    Atrial Fibrillation  Change to Toprol XL and reduced total beta-blocker dose to 12.5 mg. Rate controlled still.  - Toprol XL 12.5 mg at bedtime - ASA 81 mg  daily - per neurology, transition back to Eliquis in 2-3 days if no more epistaxis or other bleeding. Noted previously that she was having some blood tinged sputum.      For questions or updates, please contact [place] Please consult [email or web address] for contact info under        Signed, Angelique Blonder, DO  02/17/2022, 8:34 AM      ATTENDING ATTESTATION:  After conducting a review of all available clinical information with the care team, interviewing the patient, and performing a physical exam, I agree with the findings and plan described in this note.   GEN: No acute distress.  Somnolent HEENT:  MMM, no JVD, no scleral icterus Cardiac: RRR, no murmurs, rubs, or gallops.  Respiratory: Clear to auscultation bilaterally. GI: Soft, nontender, non-distended  MS: No edema; No deformity. Neuro:  Nonfocal  Vasc:  +2 radial pulses  No recurrence of bradycardia.  Likely due to vasovagal mechanism during episode of hemoptysis requiring inhaler therapy.  Monitor for now.  Will sign off.  Resumption of Eliquis per Neurology.  Lenna Sciara, MD Pager [phone]

## 2022-02-17 NOTE — Progress Notes (Signed)
Occupational Therapy Session Note  Patient Details  Name: Linda [name] MRN: [id number] Date of Birth: 1933-10-31  Today's Date: 02/17/2022  Short Term Goals: Week 1:  OT Short Term Goal 1 (Week 1): Pt will complete 1/3 toileting steps with no more than CGA for balance OT Short Term Goal 2 (Week 1): Pt will complete LB dressing with CGA OT Short Term Goal 3 (Week 1): Pt will imrpove room air cardiorespiratory endurance for 2 consecutive sessions with SPO2 WNLs  Skilled Therapeutic Interventions/Progress Updates:    Upon OT arrival, pt semi recumbent in bed resting. Therapist attempts to arouse pt with tactile and verbal stimulation for extended period of time with assist from RN. Pt was unable to be aroused and continues to sleep. Pt missed 75 minutes of OT treatment time.   General: General OT Amount of Missed Time: 75 Minutes   Therapy/Group: Individual Therapy  Marvetta Gibbons 02/17/2022, 10:53 AM

## 2022-02-17 NOTE — Progress Notes (Signed)
Physical Therapy Session Note  Patient Details  Name: Linda [name] MRN: [id number] Date of Birth: 01/02/1934  Today's Date: 02/17/2022 PT Individual Time: 0930-1008 and 1400-1426  PT Individual Time Calculation (min): 38 min and 26 min  Short Term Goals: Week 1:  PT Short Term Goal 1 (Week 1): pt will perform bed mobility with CGA overall PT Short Term Goal 2 (Week 1): pt will transfer bed<>chair with LRAD and supervision PT Short Term Goal 3 (Week 1): pt will ambulate 69f with LRAD and CGA  Skilled Therapeutic Interventions/Progress Updates:   Treatment Session 1 Received pt semi-reclined in bed asleep. Per RN/NT pt calling out "[name] all morning - potentially daughter's name and unable to wake up long enough to eat breakfast or take medications. Pt with continued expressive aphasia. Session with emphasis on functional mobility/transfers, toileting, arousal/alertness, and participation. Removed blankets and encouraged getting up; pt responded "no" and "please honey" and began whining. Therapist initiated getting pt to EOB with total A and pt woke up, but became emotionally labile; then began patting at brief indicating need to void.   Pt hanging over RW moaning while sitting EOB and required max encouragement and mod A to stand from EOB and min A to pivot to bedside commode. Pt required total A for clothing management and max cues to remain standing, as pt attempting to sit before therapist could remove clothing. Pt able to void, then transferred bedside commode<>recliner stand<>pivot with RW and min A with maximal encouragement as pt wanting to return to bed - encouraged pt to stay up to eat and for NT to change linens. Pt ate entire fruit cup and called in order for potato soup (pt's favorite) and pt smiled and held onto therapist's arm in appreciation. RN arrived and with encouragement from PT, RN, and NT pt reluctatly took medications (initially refusing). Concluded session with pt sitting  in recliner, needs within reach, and seatbelt alarm on.   Treatment Session 2 Received pt sidelying in bed with NT present checking vitals. Pt with continued expressive aphasia. Session with emphasis on functional mobility/transfers, toileting, and generalized strengthening and endurance. BP:122/103 HR 62bpm. Per NT, pt did not eat any lunch but did eat potato soup for breakfast. Encouraged pt to drink ensure but pt refused, but when asked if she wanted more potato soup stated "Yes I love that". Confirmed with NT and called in another order for soup.   Pt then verbalized "pee" indicating need to void. Pt transferred semi-reclined<>sitting EOB with HOB elevated with supervision and stood with RW and heavy min A and transferred to bedside commode with RW and min A. Pt required max A for clothing management and able to void and with BM - NT notified. Pt able to stand and perform peri-care with CGA and pulled pants over hips with mod A. Returned to bed and transferred sit<>supine with supervision. Concluded session with pt semi-reclined in bed, needs within reach, and bed alarm on.   Therapy Documentation Precautions:  Precautions Precautions: Fall, Other (comment) Precaution Comments: HOH, aphaisa Restrictions Weight Bearing Restrictions: No  Therapy/Group: Individual Therapy AAlfonse AlpersPT, DPT  02/17/2022, 7:03 AM

## 2022-02-17 NOTE — Progress Notes (Signed)
Pt is sleepy and arouses to voice, HR is in 50's. Resp even and unlabored. Metoprolol not given. PT has caregiver at beside that reports pt was unable to eat lunch at 3PM rlt sleepiness. Pt also reports watery stools and refused stool softener.  Trazodone not given at this time. RN will continue to monitor for acute changes in condition.   Pt up with assistance twice during the shift to void. No distress overnight. No issues sleeping without the new half of trazodone. Pt seems more and alert and aware this morning.

## 2022-02-18 LAB — GLUCOSE, CAPILLARY
Glucose-Capillary: 128 mg/dL — ABNORMAL HIGH (ref 70–99)
Glucose-Capillary: 148 mg/dL — ABNORMAL HIGH (ref 70–99)

## 2022-02-18 NOTE — Progress Notes (Signed)
PROGRESS NOTE   Subjective/Complaints:  Very sleepy this AM- sleeping sitting up- Woke briefly.  Nonverbal this AM- but nodding head yes and no.   ROS: limited due to fatigue   Objective:   No results found.  Recent Labs    02/17/22 0855  WBC 4.9  HGB 15.2*  HCT 44.8  PLT 216   Recent Labs    02/17/22 0855  NA 137  K 3.5  CL 97*  CO2 29  GLUCOSE 174*  BUN 12  CREATININE 0.81  CALCIUM 9.3    Intake/Output Summary (Last 24 hours) at 02/18/2022 1021 Last data filed at 02/18/2022 0800 Gross per 24 hour  Intake 591 ml  Output --  Net 591 ml        Physical Exam: Vital Signs Blood pressure (!) 151/69, pulse 68, temperature 98.5 F (36.9 C), resp. rate 15, height '5\' 3"'$  (1.6 m), weight 71.8 kg, SpO2 92 %.   General: woke briefly- but asleep sitting up; nonverbal, NAD HENT: conjugate gaze; oropharynx moist CV: regular rate; no JVD Pulmonary: CTA B/L; no W/R/R- good air movement GI: soft, NT, ND, (+)BS Psychiatric calm Neurological: aphasic- nonverbal but nodding head yes and no Skin: No evidence of breakdown, no evidence of rash Neurologic: Cranial nerves II through XII intact, motor strength is 5/5 in Left 4+/5 Right deltoid, bicep, tricep, grip, hip flexor, knee extensors, ankle dorsiflexor and plantar flexor Responding to questions better than yesterday Sensory exam Withdraws to pinch Severe aphasia, unable to follow simple commands without gestural cues, per daughter also HOH   Musculoskeletal: no jt deformities No joint swelling  Psych: anxious and agitated at times   Assessment/Plan: 1. Functional deficits which require 3+ hours per day of interdisciplinary therapy in a comprehensive inpatient rehab setting. Physiatrist is providing close team supervision and 24 hour management of active medical problems listed below. Physiatrist and rehab team continue to assess barriers to discharge/monitor  patient progress toward functional and medical goals  Care Tool:  Bathing    Body parts bathed by patient: Right arm, Left arm, Chest, Abdomen, Front perineal area, Buttocks, Right upper leg, Left upper leg, Face   Body parts bathed by helper: Right lower leg, Left lower leg     Bathing assist Assist Level: Minimal Assistance - Patient > 75%     Upper Body Dressing/Undressing Upper body dressing   What is the patient wearing?: Button up shirt    Upper body assist Assist Level: Minimal Assistance - Patient > 75%    Lower Body Dressing/Undressing Lower body dressing      What is the patient wearing?: Incontinence brief, Pants     Lower body assist Assist for lower body dressing: Moderate Assistance - Patient 50 - 74%     Toileting Toileting    Toileting assist Assist for toileting: Minimal Assistance - Patient > 75%     Transfers Chair/bed transfer  Transfers assist  Chair/bed transfer activity did not occur: Safety/medical concerns  Chair/bed transfer assist level: Contact Guard/Touching assist     Locomotion Ambulation   Ambulation assist      Assist level: Moderate Assistance - Patient 50 - 74% Assistive device: Walker-rolling  Max distance: 23f   Walk 10 feet activity   Assist     Assist level: Moderate Assistance - Patient - 50 - 74% Assistive device: Walker-rolling   Walk 50 feet activity   Assist Walk 50 feet with 2 turns activity did not occur: Safety/medical concerns (fatigue, weakness/deconditioning)         Walk 150 feet activity   Assist Walk 150 feet activity did not occur: Safety/medical concerns (fatigue, weakness/deconditioning)         Walk 10 feet on uneven surface  activity   Assist Walk 10 feet on uneven surfaces activity did not occur: Safety/medical concerns (fatigue, weakness/deconditioning)         Wheelchair     Assist Is the patient using a wheelchair?: Yes Type of Wheelchair:  Manual Wheelchair activity did not occur: Safety/medical concerns (fatigue, weakness/deconditioning)         Wheelchair 50 feet with 2 turns activity    Assist    Wheelchair 50 feet with 2 turns activity did not occur: Safety/medical concerns (fatigue, weakness/deconditioning)       Wheelchair 150 feet activity     Assist  Wheelchair 150 feet activity did not occur: Safety/medical concerns (fatigue, weakness/deconditioning)       Blood pressure (!) 151/69, pulse 68, temperature 98.5 F (36.9 C), resp. rate 15, height '5\' 3"'$  (1.6 m), weight 71.8 kg, SpO2 92 %.  Medical Problem List and Plan: 1. Functional deficits secondary to left MCA infarct/M3 occlusion status post TNK with revascularization.  ICH right frontal small hematoma likely hemorrhagic transformation status post TNK..Marland Kitchenesume Eliquis in 1-2 days.              -patient may  shower             -ELOS/Goals: 15 days minA  Updated daughter  Start B complex with vitamin C tablet  Con't CIR- PT, OT and SLP 2.  Antithrombotics: -DVT/anticoagulation:  Mechanical: Antiembolism stockings, thigh (TED hose) Bilateral lower extremities             -antiplatelet therapy: Aspirin 81 mg daily 3. Pain Management: Tylenol as needed  9/2- Denies pain- con't regimen 4. Mood/Behavior/Sleep: Prozac 10 mg daily, trazodone as needed             -antipsychotic agents: N/A 5. Neuropsych/cognition: This patient is not capable of making decisions on her own behalf. 6. Skin/Wound Care: Routine skin checks 7. Fluids/Electrolytes/Nutrition: Routine in and outs with follow-up chemistries 8.  Atrial fibrillation.  Chronic Eliquis on hold.  Lopressor 12.5 mg twice daily, Cozaar 25 mg daily.  Cardiac rate controlled 9.  History of severe mitral regurgitation status post MitraClip 11/2020 10.  Diastolic congestive heart failure.  Lasix 20 mg daily.  Monitor for any signs of fluid overload 11.  Diabetes mellitus.  Hemoglobin A1c 6.6.  SSI.   Patient on Farxiga 5 mg daily prior to admission.  Resume as needed 12.  Hyperlipidemia.  Zetia 13.  GERD.  Protonix 14.  Epistaxis.  [facility]. Suboptimal vitamin D: start 1,000U daily 16. Hypokalemia: increase daily potassium supplement to 484m daily, repeat potassium on Monday 17. New left lung opacity which could be developing pneumonia: check procalcitonin level.  18. Bradycardia: resolved d/ced the nebulizer treatments, likely due to vagal response from treatments.  19. Sputum with flecks of blood: continue to hold Eliquis for 2-3 more days. Asked nursing to tell me if they note any more blood in sputum.  20. Lethargy improved: consulted cardiology given associated bradycardia, Toprol XL changed to HS  9/2- woke up with therapies yesterday    LOS: 5 days A FACE TO FACE EVALUATION WAS PERFORMED  Draper Gallon 02/18/2022, 10:21 AM

## 2022-02-18 NOTE — Progress Notes (Signed)
Occupational Therapy Session Note  Patient Details  Name: Linda [name] MRN: [id number] Date of Birth: [birth year]  Today's Date: 02/18/2022 OT Individual Time: 6922-3009 OT Individual Time Calculation (min): 72 min    Short Term Goals: Week 1:  OT Short Term Goal 1 (Week 1): Pt will complete 1/3 toileting steps with no more than CGA for balance OT Short Term Goal 2 (Week 1): Pt will complete LB dressing with CGA OT Short Term Goal 3 (Week 1): Pt will imrpove room air cardiorespiratory endurance for 2 consecutive sessions with SPO2 WNLs  Skilled Therapeutic Interventions/Progress Updates:     Pt received in EOB with no pain reported stating "no" when asked about fatigue, but states "yes" to being tired.  ADL: Pt completes ADL at overall MIN Level. Skilled interventions include: min-MOD A for ambulatory transfer to bathroom 2x throughout session with intermittent BLE jerking v buckling. Pt repeatedly stating, "oh, honey" but proceeding to the toilet. Requiring MOD A for clothing amangment and cuing/gesture to put used toilet paper in toilet. Hand hyiene at sink with MOD HOH A for water on/off and throwing away towel into trash as pt does not scan past sink. Oral care with S and no HOH A needed. Vitals taken at sink side 130/92 HR 82 With yes no questions pt able to indicate wanting long sleeve clothing since pt is cold.  Dressing sit to stand at sink with MIN A for UB dressing and HOH A to pull sweature down back in standing with RUE as pt unaware she is only pulling down shirt not sweater LB dressing for pants and socks with MOD A  for donning pants and min cuing for persisting with task when first attempt not successful and sequencing task. Stool used under feet for socks and able ot complete with S. Would benefit from using for donning pants to improve independence from higher surfaces.   Pt left at end of session in bed with exit alarm on, call light in reach and all needs met   Therapy  Documentation Precautions:  Precautions Precautions: Fall, Other (comment) Precaution Comments: HOH, aphaisa Restrictions Weight Bearing Restrictions: No General:    Therapy/Group: Individual Therapy  Tonny Branch 02/18/2022, 6:48 AM

## 2022-02-18 NOTE — Progress Notes (Signed)
Physical Therapy Session Note  Patient Details  Name: Linda [name] MRN: [id number] Date of Birth: June 17, 1934  Today's Date: 02/18/2022 PT Individual Time: 9326-7124 and 5809-9833 PT Individual Time Calculation (min): 53 min and 41 min PT Missed Time: 34 minutes PT Missed Time Reason: Fatigue  Short Term Goals: Week 1:  PT Short Term Goal 1 (Week 1): pt will perform bed mobility with CGA overall PT Short Term Goal 2 (Week 1): pt will transfer bed<>chair with LRAD and supervision PT Short Term Goal 3 (Week 1): pt will ambulate 16f with LRAD and CGA  Skilled Therapeutic Interventions/Progress Updates:   Treatment Session 1 Received pt semi-reclined in bed asleep. Pt with continued expressive aphasia and lethargy during sessions. Session with emphasis on functional mobility/transfers, generalized strengthening and endurance, and gait training. Pt transferred semi-reclined<>sitting EOB with HOB elevated and supervision and required x 2 attempts and min A to stand from EOB with RW. Pt transferred bed<>WC stand<>pivot with RW and CGA but demonstrated episode of knee buckling requiring min A to correct. SPO2 93% but pt demonstrating slight SOB with minimal activity. Pt transported outside to [facility] of [facility] [facility] for change in environmental stimuli and to uplift spirits. On way downstairs pt perseverating on being "cold" but once outside, enjoyed the sunshine. Picked dandelion for pt and pt became emotional stating "so sweet" and hugging therapist. Noted pt falling in/out of sleep while outside and reported fatigue.  Returned to room and pt immediately pointed at bed. Sit<>stand with RW and CGA - cues to push up from [facility] and pt ambulated 159fwith RW and min A back to bed - noted 3 instances of bilateral knee buckling/bouncing when pushing RW too far forward, requiring mod A to correct. Of note, knee buckling is new today and per OT, same thing happened when ambulating to/from  bathroom. [facility] with max A and donned non-skid socks with supervision while long sitting in bed. Pt transferred long sitting<>semi-reclined with supervision. Concluded session with pt semi-reclined in bed, needs within reach, and bed alarm on. Provided pt with warm blankets and fresh drink and pt appreciative.   Treatment Session 2 Received pt semi-reclined in bed. Pt with continued expressive aphasia stating "I want to see the baby". Session with emphasis on functional mobility/transfers, simulated car transfers, and generalized strengthening and endurance. Pt transferred semi-reclined<>sitting EOB with HOB elevated and supervision and transferred bed<>WC stand<>pivot with RW and CGA - 1 episode of knee buckling resulting in posterior LOB onto bed.   [facility] management purposes. Pt performed simulated car transfer with RW and CGA but when ambulating [id number]/from car experienced multiple episodes of knee buckling, requiring mod A to correct. Pt also required max cues and manual facilitation to keep RW within BOS as pt with tendency to push too far forward. Attempted to work on BUE strengthening on [place] pt slumping forward and closing eyes requiring hand over hand assist to propel for 30 seconds before stopping due to lack of participation. HR fluctuating from 25-84bpm (has A-fib). Notified RN and took manual HR reading - 58bpm. Pt reported fatigue and requested to return to room; upon returning pt stated "my baby" pointing at her bed. Transferred WC<>bed stand<>pivot with RW and min A - another episode of knee buckling and returned to supine with supervision. Concluded session with pt semi-reclined in bed, needs within reach, and bed alarm on. 34 minutes  missed of skilled physical therapy due to fatigue.   Therapy Documentation Precautions:  Precautions Precautions: Fall, Other (comment) Precaution Comments: HOH,  aphaisa Restrictions Weight Bearing Restrictions: No  Therapy/Group: Individual Therapy Alfonse Alpers PT, DPT  02/18/2022, 6:51 AM

## 2022-02-19 LAB — GLUCOSE, CAPILLARY: Glucose-Capillary: 150 mg/dL — ABNORMAL HIGH (ref 70–99)

## 2022-02-19 MED ORDER — MELATONIN 3 MG PO TABS
3.0000 mg | ORAL_TABLET | Freq: Every day | ORAL | Status: DC
  Administered 2022-02-19 – 2022-02-26 (×6): 3 mg via ORAL
  Filled 2022-02-19 (×8): qty 1

## 2022-02-19 NOTE — Progress Notes (Signed)
Physical Therapy Session Note  Patient Details  Name: Linda [name] MRN: [id number] Date of Birth: 27-Oct-1933  Today's Date: 02/19/2022 PT Individual Time: 1015-1105 PT Individual Time Calculation (min): 50 min   Short Term Goals: Week 1:  PT Short Term Goal 1 (Week 1): pt will perform bed mobility with CGA overall PT Short Term Goal 2 (Week 1): pt will transfer bed<>chair with LRAD and supervision PT Short Term Goal 3 (Week 1): pt will ambulate 67f with LRAD and CGA  Skilled Therapeutic Interventions/Progress Updates:     Patient in w/c with SLP upon PT arrival. Patient alert and agreeable to early PT session. Patient denied pain during session.  Patient responded consistently to yes/no questions, able to verbalize short 3-5 word phrases with >50% accuracy, used yes/no questions to confirm meaning if incorrect word was used. Patient indicated she does not like her door closed during the session and reports poor sleep quality during her stay. MD made aware during rounding.   RN provided medications during session. Patient with coughing bout while taking medications whole with water using a straw. Provided cues for a strong cough and dry swallows before taking small sips of water without a straw to prevent further aspiration. MD in room during aspiration episode, discussed with SLP and recommended supervision for drinking thin liquids without a straw at this time, RN made aware.   Patient reported increased fatigue and requested to get back to bed. Denied toileting needs at this time. Agreeable to ambulating from the door to the bed before getting in bed. Patient required increased time for initiation, cuing, communication, and for completion of tasks throughout session. Utilized therapeutic use of self throughout to promote efficiency.   Therapeutic Activity: Bed Mobility: Patient performed sit to supine with supervision in a mostly flat bed without use of bed rails. Provided verbal cues for   initiation and use of upper extremities for trunk control. Transfers: Patient performed sit to/from stand x1 with CGA using RW. Provided verbal cues for hand placement and forward weight shift.  Gait Training:  Patient ambulated 15 feet using RW with min A. Ambulated with decreased gait speed, decreased step length and height L>R, narrow BOS, mild partial L knee buckling in stance, patient able to catch herself, forward trunk lean, and downward head gaze. Provided verbal cues for erect posture, looking ahead, increased BOS, and forward propulsion.  Patient reporting, "I can't see" pointing at the white board in her room. Placed glasses on without change. Patient able to read the numbers on the clock with multiple attempts to state the right numbers initially. Patient unable to identify any letters on the white board. Will recommend continued visual assessment at a later time due to patient fatigue in the bed. Patient missed 10 min of skilled PT due to fatigue, RN made aware. Will attempt to make-up missed time as able.    Patient sitting up in bed at end of session with breaks locked, bed alarm set, and all needs within reach. Drinks placed out of reach at end of session. Patient provided sips of water x3 without further signs of aspiration without use of a straw and cues for small sips.   Therapy Documentation Precautions:  Precautions Precautions: Fall, Other (comment) Precaution Comments: HOH, aphaisa Restrictions Weight Bearing Restrictions: No General: PT Amount of Missed Time (min): 10 Minutes PT Missed Treatment Reason: Patient fatigue    Therapy/Group: Individual Therapy  Joeph Szatkowski L Jovahn Breit PT, DPT, NCS, CBIS  02/19/2022, 12:30 PM

## 2022-02-19 NOTE — Progress Notes (Signed)
Speech Language Pathology Daily Session Note  Patient Details  Name: Linda [name] MRN: [id number] Date of Birth: 1934/02/01  Today's Date: 02/19/2022 SLP Individual Time: 3235-5732 SLP Individual Time Calculation (min): 45 min  Short Term Goals: Week 1: SLP Short Term Goal 1 (Week 1): Pt will repeat CVC, CV, VC word combinations with 60% accuracy given Max A multimodal cues. SLP Short Term Goal 2 (Week 1): Pt will utilize multimodal communication to convey wants and needs x 8 with Set-Up A and/or question prompts. SLP Short Term Goal 3 (Week 1): Pt will answer environmental and semi-complex yes/no questions with 60% accuracy given Max A multimodal cues. SLP Short Term Goal 4 (Week 1): Pt will follow one-step commands on 80% of opportunities given Mod A multimodal cues. SLP Short Term Goal 5 (Week 1): Pt will participate in further reading and writing assessment to aid in d/c planning and ST POC with 100% completion.  Skilled Therapeutic Interventions:  Pt was seen for skilled ST targeting goals for communication.  Upon arrival, pt was resting quietly in bed but awakened to voice and was agreeable to participating in treatment.  Pt independently indicated that she did not want the blinds open by pointing and saying "no" when therapist attempted to open them at the beginning of today's therapy session.  Pt was agreeable to getting out of bed for treatment session.  When transferring to wheelchair pt needed intermittent min verbal cues for safety due to impulsivity.  Pt requested to go to the bathroom by patting her stomach, making a facial grimace, and stating "I need the muffin."  When asked clarifying questions pt was able to verify that she instead meant to say bathroom.  Pt was continent of bladder and bowel while seated on commode.  Pt requested to return to bed at the end of today's therapy session by pointing to bed and saying "I want."  However, PT arrived early for treatment session and pt was  agreeable to staying up in wheelchair for appointment.  Continue per current plan of care.    Pain Pain Assessment Pain Scale: 0-10 Pain Score: 0-No pain  Therapy/Group: Individual Therapy  Inara Dike, Selinda Orion 02/19/2022, 12:21 PM

## 2022-02-19 NOTE — Progress Notes (Signed)
PROGRESS NOTE   Subjective/Complaints:  Sitting up in bedside chair- nursing giving meds one at a time- PT there as wel- pt didn't sleep well last night, so wants to go back to bed- PT encouraging her to do therapy.  Coughing to protect airway Holding trazodone due to sedation, per Nursing.    ROS: limited due to aphasia   Objective:   No results found.  Recent Labs    02/17/22 0855  WBC 4.9  HGB 15.2*  HCT 44.8  PLT 216   Recent Labs    02/17/22 0855  NA 137  K 3.5  CL 97*  CO2 29  GLUCOSE 174*  BUN 12  CREATININE 0.81  CALCIUM 9.3    Intake/Output Summary (Last 24 hours) at 02/19/2022 1616 Last data filed at 02/19/2022 1300 Gross per 24 hour  Intake 357 ml  Output --  Net 357 ml        Physical Exam: Vital Signs Blood pressure 129/69, pulse (!) 50, temperature 98 F (36.7 C), temperature source Oral, resp. rate 16, height '5\' 3"'$  (1.6 m), weight 74.5 kg, SpO2 91 %.    General: awake, alert, appropriate, sitting up in bedside chair- nursing giving meds 1 at a time and PT in room; intermittent coughing; NAD HENT: conjugate gaze; oropharynx moist JX:[id number] rate; no JVD Pulmonary: coarse cough when swallowing pill/water, but sounds clear otherwise.  GI: soft, NT, ND, (+)BS Psychiatric: appropriate- flat Neurological: aphasic- saying a few words-  Skin: No evidence of breakdown, no evidence of rash Neurologic: Cranial nerves II through XII intact, motor strength is 5/5 in Left 4+/5 Right deltoid, bicep, tricep, grip, hip flexor, knee extensors, ankle dorsiflexor and plantar flexor Responding to questions better than yesterday Sensory exam Withdraws to pinch Severe aphasia, unable to follow simple commands without gestural cues, per daughter also HOH   Musculoskeletal: no jt deformities No joint swelling  Psych: anxious and agitated at times   Assessment/Plan: 1. Functional deficits which  require 3+ hours per day of interdisciplinary therapy in a comprehensive inpatient rehab setting. Physiatrist is providing close team supervision and 24 hour management of active medical problems listed below. Physiatrist and rehab team continue to assess barriers to discharge/monitor patient progress toward functional and medical goals  Care Tool:  Bathing    Body parts bathed by patient: Right arm, Left arm, Chest, Abdomen, Front perineal area, Buttocks, Right upper leg, Left upper leg, Face   Body parts bathed by helper: Right lower leg, Left lower leg     Bathing assist Assist Level: Minimal Assistance - Patient > 75%     Upper Body Dressing/Undressing Upper body dressing   What is the patient wearing?: Button up shirt    Upper body assist Assist Level: Minimal Assistance - Patient > 75%    Lower Body Dressing/Undressing Lower body dressing      What is the patient wearing?: Incontinence brief, Pants     Lower body assist Assist for lower body dressing: Moderate Assistance - Patient 50 - 74%     Toileting Toileting    Toileting assist Assist for toileting: Minimal Assistance - Patient > 75%     Transfers Chair/bed  transfer  Transfers assist  Chair/bed transfer activity did not occur: Safety/medical concerns  Chair/bed transfer assist level: Contact Guard/Touching assist     Locomotion Ambulation   Ambulation assist      Assist level: Moderate Assistance - Patient 50 - 74% Assistive device: Walker-rolling Max distance: 52f   Walk 10 feet activity   Assist     Assist level: Moderate Assistance - Patient - 50 - 74% Assistive device: Walker-rolling   Walk 50 feet activity   Assist Walk 50 feet with 2 turns activity did not occur: Safety/medical concerns (fatigue, weakness/deconditioning)         Walk 150 feet activity   Assist Walk 150 feet activity did not occur: Safety/medical concerns (fatigue, weakness/deconditioning)          Walk 10 feet on uneven surface  activity   Assist Walk 10 feet on uneven surfaces activity did not occur: Safety/medical concerns (fatigue, weakness/deconditioning)         Wheelchair     Assist Is the patient using a wheelchair?: Yes Type of Wheelchair: Manual Wheelchair activity did not occur: Safety/medical concerns (fatigue, weakness/deconditioning)         Wheelchair 50 feet with 2 turns activity    Assist    Wheelchair 50 feet with 2 turns activity did not occur: Safety/medical concerns (fatigue, weakness/deconditioning)       Wheelchair 150 feet activity     Assist  Wheelchair 150 feet activity did not occur: Safety/medical concerns (fatigue, weakness/deconditioning)       Blood pressure 129/69, pulse (!) 50, temperature 98 F (36.7 C), temperature source Oral, resp. rate 16, height '5\' 3"'$  (1.6 m), weight 74.5 kg, SpO2 91 %.  Medical Problem List and Plan: 1. Functional deficits secondary to left MCA infarct/M3 occlusion status post TNK with revascularization.  ICH right frontal small hematoma likely hemorrhagic transformation status post TNK..Marland Kitchenesume Eliquis in 1-2 days.              -patient may  shower             -ELOS/Goals: 15 days minA  Updated daughter  Start B complex with vitamin C tablet  Continue CIR- PT, OT and SLP  2.  Antithrombotics: -DVT/anticoagulation:  Mechanical: Antiembolism stockings, thigh (TED hose) Bilateral lower extremities             -antiplatelet therapy: Aspirin 81 mg daily 3. Pain Management: Tylenol as needed  9/2- Denies pain- con't regimen 4. Mood/Behavior/Sleep: Prozac 10 mg daily, trazodone as needed  9/3- d/c trazodone- too sleepy and give melatonin 3 mg QHS             -antipsychotic agents: N/A 5. Neuropsych/cognition: This patient is not capable of making decisions on her own behalf. 6. Skin/Wound Care: Routine skin checks 7. Fluids/Electrolytes/Nutrition: Routine in and outs with follow-up  chemistries 8.  Atrial fibrillation.  Chronic Eliquis on hold.  Lopressor 12.5 mg twice daily, Cozaar 25 mg daily.  Cardiac rate controlled 9.  History of severe mitral regurgitation status post MitraClip 11/2020 10.  Diastolic congestive heart failure.  Lasix 20 mg daily.  Monitor for any signs of fluid overload 11.  Diabetes mellitus.  Hemoglobin A1c 6.6.  SSI.  Patient on Farxiga 5 mg daily prior to admission.  Resume as needed 12.  Hyperlipidemia.  Zetia 13.  GERD.  Protonix 14.  Epistaxis.  [facility]. Suboptimal vitamin D: start 1,000U daily 16. Hypokalemia: increase daily potassium supplement  to 15mq daily, repeat potassium on Monday 17. New left lung opacity which could be developing pneumonia: check procalcitonin level. 9/3- coughing with taking pills- asked nursing/PT to have SLP reassess.   18. Bradycardia: resolved d/ced the nebulizer treatments, likely due to vagal response from treatments.  19. Sputum with flecks of blood: continue to hold Eliquis for 2-3 more days. Asked nursing to tell me if they note any more blood in sputum.  20. Lethargy improved: consulted cardiology given associated bradycardia, Toprol XL changed to HS  9/2- woke up with therapies yesterday  9/3 more awake today- but didn't sleep overnight, so wants to go back to sleep    LOS: 6 days A FACE TO FACE EVALUATION WAS PERFORMED  Meganne Rita 02/19/2022, 4:16 PM

## 2022-02-20 DIAGNOSIS — E1169 Type 2 diabetes mellitus with other specified complication: Secondary | ICD-10-CM

## 2022-02-20 DIAGNOSIS — E876 Hypokalemia: Secondary | ICD-10-CM

## 2022-02-20 DIAGNOSIS — I5030 Unspecified diastolic (congestive) heart failure: Secondary | ICD-10-CM

## 2022-02-20 LAB — CBC
HCT: 46.3 % — ABNORMAL HIGH (ref 36.0–46.0)
Hemoglobin: 15.6 g/dL — ABNORMAL HIGH (ref 12.0–15.0)
MCH: 32.6 pg (ref 26.0–34.0)
MCHC: 33.7 g/dL (ref 30.0–36.0)
MCV: 96.7 fL (ref 80.0–100.0)
Platelets: 210 10*3/uL (ref 150–400)
RBC: 4.79 MIL/uL (ref 3.87–5.11)
RDW: 13.3 % (ref 11.5–15.5)
WBC: 6.8 10*3/uL (ref 4.0–10.5)
nRBC: 0 % (ref 0.0–0.2)

## 2022-02-20 LAB — BASIC METABOLIC PANEL
Anion gap: 11 (ref 5–15)
BUN: 11 mg/dL (ref 8–23)
CO2: 26 mmol/L (ref 22–32)
Calcium: 9.2 mg/dL (ref 8.9–10.3)
Chloride: 100 mmol/L (ref 98–111)
Creatinine, Ser: 0.81 mg/dL (ref 0.44–1.00)
GFR, Estimated: >60 mL/min (ref >60)
Glucose, Bld: 155 mg/dL — ABNORMAL HIGH (ref 70–99)
Potassium: 3.4 mmol/L — ABNORMAL LOW (ref 3.5–5.1)
Sodium: 137 mmol/L (ref 135–145)

## 2022-02-20 LAB — GLUCOSE, CAPILLARY: Glucose-Capillary: 136 mg/dL — ABNORMAL HIGH (ref 70–99)

## 2022-02-20 MED ORDER — POTASSIUM CHLORIDE 20 MEQ PO PACK
20.0000 meq | PACK | Freq: Once | ORAL | Status: AC
  Administered 2022-02-20: 20 meq via ORAL
  Filled 2022-02-20: qty 1

## 2022-02-20 NOTE — Progress Notes (Signed)
Speech Language Pathology Daily Session Note  Patient Details  Name: Linda [name] MRN: [id number] Date of Birth: 04-19-1934  Today's Date: 02/20/2022 SLP Individual Time: 8469-6295 SLP Individual Time Calculation (min): 59 min  Short Term Goals: Week 1: SLP Short Term Goal 1 (Week 1): Pt will repeat CVC, CV, VC word combinations with 60% accuracy given Max A multimodal cues. SLP Short Term Goal 2 (Week 1): Pt will utilize multimodal communication to convey wants and needs x 8 with Set-Up A and/or question prompts. SLP Short Term Goal 3 (Week 1): Pt will answer environmental and semi-complex yes/no questions with 60% accuracy given Max A multimodal cues. SLP Short Term Goal 4 (Week 1): Pt will follow one-step commands on 80% of opportunities given Mod A multimodal cues. SLP Short Term Goal 5 (Week 1): Pt will participate in further reading and writing assessment to aid in d/c planning and ST POC with 100% completion.  Skilled Therapeutic Interventions:Skilled ST services focused on language skills. Nurse was providing medication to pt upon SLP entering. Pt expressed " cut half" and gestured to cut pill. SLP facilitated writting and reading skills at word level. Pt demonstrated ability to copy 3-4 letter words in 5/5 opportunities with max A fade to min A verbal cues likely due to reduced auditory comprehension. Pt was able to name 2/5 initial /p/ words, only producing the CV not CVC combinations. Pt was able to read aloud 2/7 words CVC words and produce the initial CV sounds in 70% of words/objects presented. Pt was able to select object from a field of 2 to match word/name in 6/7 opportunities with x2 sentence completion cues. Pt attempted to communicate about picture of little girl and flowers in room. SLP communicated with pt's daughter via phone and created a list of family members. Pt demonstrated ability to name them ( ex: " is your daughters name laura or jennifer?") in response to yes/no  questions with 80% accuracy and repeated (daughter and sons's name.) Pt was left in room with call bell within reach and bed alarm set. SLP recommends to continue skilled services.     Pain Pain Assessment Pain Score: 0-No pain  Therapy/Group: Individual Therapy  Linda [name]  [facility] 02/20/2022, 1:27 PM

## 2022-02-20 NOTE — Progress Notes (Signed)
Occupational Therapy Session Note  Patient Details  Name: Linda [name] MRN: [id number] Date of Birth: 1934-04-06  Today's Date: 02/20/2022 OT Individual Time: 0930-1030 OT Individual Time Calculation (min): 60 min    Short Term Goals: Week 1:  OT Short Term Goal 1 (Week 1): Pt will complete 1/3 toileting steps with no more than CGA for balance OT Short Term Goal 2 (Week 1): Pt will complete LB dressing with CGA OT Short Term Goal 3 (Week 1): Pt will imrpove room air cardiorespiratory endurance for 2 consecutive sessions with SPO2 WNLs  Skilled Therapeutic Interventions/Progress Updates:    Upon OT arrival, pt semi recumbent reporting no pain. Pt agreeable to OT treatment. Treatment intervention with a focus on self care retraining, item recognition, and problem solving. Pt completes supine to sit transfer with Supervision and completes bed to w/c transfer with CGA. Pt seated in w/c at sink to complete sponge bath ADL at the levels below. Pt with mod difficulty naming items including shirt, pants, water, soap, requiring verbal cues to assist. Pt mildly impulsive and perseverates requiring verbal cues to correct. Pt requires increased time and rest breaks secondary to fatigue. Pt was transported down to dayroom gym via w/c and Total A for time. While seated at tabletop, pt was provided a Risk analyst of colored pegs to replicate in peg board. Pt requires min verbal cues to iniitate but was able to replicate 57 peg design with 1 error. Pt was unable to recognize or correct error herself. Pt was returned to her room via w/c and Total A and left in her w/c with all safety measures in place.   Therapy Documentation Precautions:  Precautions Precautions: Fall, Other (comment) Precaution Comments: HOH, aphaisa Restrictions Weight Bearing Restrictions: No   ADL: Grooming: Supervision/safety Where Assessed-Grooming: Sitting at sink Upper Body Bathing: Supervision/safety Where Assessed-Upper Body  Bathing: Sitting at sink Lower Body Bathing: Minimal assistance Where Assessed-Lower Body Bathing: Standing at sink, Sitting at sink Upper Body Dressing: Supervision/safety Where Assessed-Upper Body Dressing: Wheelchair, Sitting at sink Lower Body Dressing: Minimal assistance Where Assessed-Lower Body Dressing: Wheelchair, Sitting at sink, Standing at sink Toileting: Minimal assistance (for balance) Where Assessed-Toileting: Glass blower/designer: Psychiatric nurse Method: Arts development officer: Grab bars    Therapy/Group: Individual Therapy  Marvetta Gibbons 02/20/2022, 11:23 AM

## 2022-02-20 NOTE — [place] FL2 (Signed)
[place] MEDICAID FL2 LEVEL OF CARE SCREENING TOOL     IDENTIFICATION  Patient Name: CHARISSE WENDELL Birthdate: 11-09-1933 Sex: female Admission Date (Current Location): 02/13/2022  [facility] and [place] Number:  Engineering geologist and Address:  [facility] [place], [place] [address], [place], [place] [address]      Provider Number:    Attending Physician Name and Address:  Izora Ribas, MD  Relative Name and Phone Number:  Cari Caraway [phone]    Current Level of Care: Hospital Recommended Level of Care: [place] Prior Approval Number:    Date Approved/Denied:   PASRR Number: [id number]  Discharge Plan: SNF    Current Diagnoses: Patient Active Problem List   Diagnosis Date Noted   Left middle cerebral artery stroke ([place]) 02/13/2022   Acute ischemic left MCA stroke ([place]) 02/08/2022   Status post stroke 02/08/2022   Middle cerebral artery embolism, left 02/08/2022   Endotracheal tube present    Chronic HFrEF (heart failure with reduced ejection fraction) (HCC)    NSVT (nonsustained ventricular tachycardia) ([place]) 02/06/2022   Hypoxia 02/05/2022   Chronic anticoagulation 02/05/2022   HFrEF (heart failure with reduced ejection fraction) ([place]) 02/05/2022   Pulmonary hypertension ([place]) 02/05/2022   Valvular cardiomyopathy ([place]) 10/22/2021   S/P mitral valve clip implantation 10/05/2021   Varicose veins of both lower extremities 07/13/2021   Injury of right leg 07/06/2021   Right leg pain 07/06/2021   Callus of foot 07/06/2021   Proximal muscle weakness 08/24/2020   Severe mitral regurgitation s/p MitraClip June 2022 07/09/2020   Fatigue 05/29/2020   Diuretic-induced hypokalemia 12/16/2019   Myalgia due to statin 12/15/2019   Acquired thrombophilia ([place]) 12/15/2019   Skin lesion 08/27/2018   Hospital discharge follow-up 05/25/2018   Abnormal chest x-ray 05/25/2018   History of CVA (cerebrovascular accident) without  residual deficits 05/10/2018   Parent-child estrangement nec 02/09/2018   Basal cell carcinoma (BCC) of right lower extremity 08/08/2017   Hematuria, gross 08/08/2017   Extremity atherosclerosis with intermittent claudication (HCC) 08/08/2017   Generalized anxiety disorder 01/31/2017   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   Controlled type 2 diabetes mellitus with microalbuminuria, without long-term current use of insulin ([place]) 01/20/2016   Insomnia secondary to anxiety 10/09/2015   Polycythemia, secondary 07/04/2015   Pre-syncope 07/02/2015   Osteopenia 08/21/2014   Gastritis 05/26/2014   Epistaxis 03/18/2014   GERD (gastroesophageal reflux disease) 03/04/2014   Medicare annual wellness visit, subsequent 01/13/2014   Essential hypertension 11/04/2013   Screening for breast cancer 07/01/2013   Left shoulder pain 03/21/2013   Hoarseness of voice 03/21/2013   Edema 09/05/2010   VENTRICULAR HYPERTROPHY, LEFT 08/16/2010   Hyperlipidemia 06/03/2010   ATRIAL FIBRILLATION 08/13/2009    Orientation RESPIRATION BLADDER Height & Weight          Incontinent Weight: 159 lb 13.3 oz (72.5 kg) Height:  '5\' 3"'$  (160 cm)  BEHAVIORAL SYMPTOMS/MOOD NEUROLOGICAL BOWEL NUTRITION STATUS      Incontinent Diet  AMBULATORY STATUS COMMUNICATION OF NEEDS Skin   Extensive Assist Verbally Normal                       Personal Care Assistance Level of Assistance  Bathing, Feeding, Dressing, Total care Bathing Assistance: Maximum assistance Feeding assistance: Maximum assistance Dressing Assistance: Maximum assistance Total Care Assistance: Maximum assistance   Functional Limitations Info             SPECIAL CARE FACTORS  FREQUENCY  PT (By licensed PT), OT (By licensed OT), Speech therapy     PT Frequency: 5x a week OT Frequency: 5x a week     Speech Therapy Frequency: 5x a week      Contractures      Additional Factors Info  Code Status Code Status Info: DNR              Current Medications (02/20/2022):  This is the current hospital active medication list Current Facility-Administered Medications  Medication Dose Route Frequency Provider Last Rate Last Admin   acetaminophen (TYLENOL) tablet 650 mg  650 mg Oral Q4H PRN Angiulli, Lavon Paganini, PA-C       Or   acetaminophen (TYLENOL) 160 MG/5ML solution 650 mg  650 mg Per Tube Q4H PRN Angiulli, Lavon Paganini, PA-C       Or   acetaminophen (TYLENOL) suppository 650 mg  650 mg Rectal Q4H PRN Angiulli, Lavon Paganini, PA-C       aspirin chewable tablet 81 mg  81 mg Oral Daily Cathlyn Parsons, PA-C   81 mg at 02/20/22 0813   B-complex with vitamin C tablet 1 tablet  1 tablet Oral Daily Raulkar, Clide Deutscher, MD   1 tablet at 02/20/22 0813   cholecalciferol (VITAMIN D3) 25 MCG (1000 UNIT) tablet 1,000 Units  1,000 Units Oral Daily Raulkar, Clide Deutscher, MD   1,000 Units at 02/20/22 1962   docusate sodium (COLACE) capsule 100 mg  100 mg Oral BID Cathlyn Parsons, PA-C   100 mg at 02/20/22 2297   ezetimibe (ZETIA) tablet 10 mg  10 mg Oral Daily Cathlyn Parsons, PA-C   10 mg at 02/20/22 0813   FLUoxetine (PROZAC) capsule 10 mg  10 mg Oral Daily Cathlyn Parsons, PA-C   10 mg at 02/20/22 9892   furosemide (LASIX) tablet 20 mg  20 mg Oral Daily Cathlyn Parsons, PA-C   20 mg at 02/20/22 1194   loratadine (CLARITIN) tablet 10 mg  10 mg Oral Daily Cathlyn Parsons, PA-C   10 mg at 02/20/22 1740   losartan (COZAAR) tablet 25 mg  25 mg Oral Daily Cathlyn Parsons, PA-C   25 mg at 02/20/22 8144   magnesium gluconate (MAGONATE) tablet 250 mg  250 mg Oral QHS Raulkar, Clide Deutscher, MD   250 mg at 02/19/22 2051   melatonin tablet 3 mg  3 mg Oral QHS Lovorn, Megan, MD   3 mg at 02/19/22 2051   metFORMIN (GLUCOPHAGE) tablet 500 mg  500 mg Oral Q breakfast Raulkar, Clide Deutscher, MD   500 mg at 02/20/22 8185   metoprolol succinate (TOPROL-XL) 24 hr tablet 12.5 mg  12.5 mg Oral QHS Angelique Blonder, DO   12.5 mg at 02/16/22 2024   pantoprazole  (PROTONIX) EC tablet 40 mg  40 mg Oral Q1200 Cathlyn Parsons, PA-C   40 mg at 02/19/22 1315   potassium chloride (KLOR-CON) packet 40 mEq  40 mEq Oral Daily Raulkar, Clide Deutscher, MD   40 mEq at 02/20/22 0812   saline (AYR) nasal gel with aloe 1 Application  1 Application Each Nare U3J PRN Angiulli, Lavon Paganini, PA-C       senna-docusate (Senokot-S) tablet 1 tablet  1 tablet Oral QHS PRN Angiulli, Lavon Paganini, PA-C       sodium chloride (OCEAN) 0.65 % nasal spray 1 spray  1 spray Each Nare PRN Angiulli, Lavon Paganini, PA-C  Discharge Medications: Please see discharge summary for a list of discharge medications.  Relevant Imaging Results:  Relevant Lab Results:   Additional Information [phone]  [name]

## 2022-02-20 NOTE — Progress Notes (Signed)
Physical Therapy Session Note  Patient Details  Name: Linda [name] MRN: [id number] Date of Birth: November 21, 1933  Today's Date: 02/20/2022 PT Individual Time: 1062-6948 PT Individual Time Calculation (min): 38 min   Today's Date: 02/20/2022 PT Missed Time: 37 Minutes Missed Time Reason: Patient fatigue  Short Term Goals: Week 1:  PT Short Term Goal 1 (Week 1): pt will perform bed mobility with CGA overall PT Short Term Goal 2 (Week 1): pt will transfer bed<>chair with LRAD and supervision PT Short Term Goal 3 (Week 1): pt will ambulate 22f with LRAD and CGA  Skilled Therapeutic Interventions/Progress Updates:   Received pt sitting in WC stating 'I'm so" and  leaning over tray table crying pointing at the bed. When therapist asked if she was tired pt stated "yes". Encouraged going to gym then returning to bed at end of session, however pt crying "no" - but was agreeable to bed level exercises. Pt transferred WC<>bed stand<>pivot with RW and CGA and doffed shoes with supervision. Pt transferred sit<>long sitting<>semi-reclined with supervision and performed the following exercises with emphasis on LE/UE strengthening: -SLR x6 bilaterally -hip adduction pillow squeezes 2x10  -hip abduction with yellow TB 2x10 -hip abduction AAROM x5 bilaterally -tricep extensions with yellow TB 2x10 bilaterally -bicep curls with yellow TB x20 bilaterally Of note, pt falling in/out of sleep with exercises. With increased time pt able to communicate being cold - assisted with donning jacket, provided heat packs, and warm blanket. Concluded session with pt semi-reclined in bed, needs within reach, and bed alarm on. Discussed with treatment team making pt 15/7 due to missing therapy time on numerous days. 37 minutes missed of skilled physical therapy due to fatigue.   Therapy Documentation Precautions:  Precautions Precautions: Fall, Other (comment) Precaution Comments: HOH, aphaisa Restrictions Weight Bearing  Restrictions: No  Therapy/Group: Individual Therapy AAlfonse AlpersPT, DPT  02/20/2022, 7:05 AM

## 2022-02-20 NOTE — Progress Notes (Signed)
Physical Therapy Note  Patient Details  Name: Linda [name] MRN: [id number] Date of Birth: 1934-02-21 Today's Date: 02/20/2022    Pt's plan of care adjusted to 15/7 after speaking with care team and discussed with MD in team conference as pt currently unable to tolerate current therapy schedule with OT, PT, and SLP.     [place], DPT  02/20/2022, 12:01 PM

## 2022-02-20 NOTE — Progress Notes (Addendum)
PROGRESS NOTE   Subjective/Complaints:  Pt sitting in bed this AM. No new concerns elicited.    ROS: limited due to aphasia   Objective:   No results found.  Recent Labs    02/17/22 0855  WBC 4.9  HGB 15.2*  HCT 44.8  PLT 216    Recent Labs    02/17/22 0855  NA 137  K 3.5  CL 97*  CO2 29  GLUCOSE 174*  BUN 12  CREATININE 0.81  CALCIUM 9.3     Intake/Output Summary (Last 24 hours) at 02/20/2022 0818 Last data filed at 02/20/2022 0805 Gross per 24 hour  Intake 836 ml  Output --  Net 836 ml         Physical Exam: Vital Signs Blood pressure (!) 148/81, pulse (!) 40, temperature (!) 97.4 F (36.3 C), resp. rate 15, height '5\' 3"'$  (1.6 m), weight 72.5 kg, SpO2 93 %.    General: awake, alert, appropriate, sitting up in bed HENT: conjugate gaze; oropharynx moist ZO:[id number] rate; no JVD Pulmonary: CTAB, no RRW  GI: soft, NT, ND, (+)BS Psychiatric: appropriate- flat Neurological: aphasic- saying a few words-  Skin: No evidence of breakdown, no evidence of rash Neurologic: Cranial nerves II through XII intact, motor strength is 5/5 in Left 4+/5 Right deltoid, bicep, tricep, grip, hip flexor, knee extensors, ankle dorsiflexor and plantar flexor Responding to questions better than yesterday Sensory exam Withdraws to pinch Severe aphasia, unable to follow simple commands without gestural cues, per daughter also HOH   Musculoskeletal: no jt deformities No joint swelling  Psych: anxious and agitated at times   Assessment/Plan: 1. Functional deficits which require 3+ hours per day of interdisciplinary therapy in a comprehensive inpatient rehab setting. Physiatrist is providing close team supervision and 24 hour management of active medical problems listed below. Physiatrist and rehab team continue to assess barriers to discharge/monitor patient progress toward functional and medical goals  Care  Tool:  Bathing    Body parts bathed by patient: Right arm, Left arm, Chest, Abdomen, Front perineal area, Buttocks, Right upper leg, Left upper leg, Face   Body parts bathed by helper: Right lower leg, Left lower leg     Bathing assist Assist Level: Minimal Assistance - Patient > 75%     Upper Body Dressing/Undressing Upper body dressing   What is the patient wearing?: Button up shirt    Upper body assist Assist Level: Minimal Assistance - Patient > 75%    Lower Body Dressing/Undressing Lower body dressing      What is the patient wearing?: Incontinence brief, Pants     Lower body assist Assist for lower body dressing: Moderate Assistance - Patient 50 - 74%     Toileting Toileting    Toileting assist Assist for toileting: Minimal Assistance - Patient > 75%     Transfers Chair/bed transfer  Transfers assist  Chair/bed transfer activity did not occur: Safety/medical concerns  Chair/bed transfer assist level: Contact Guard/Touching assist     Locomotion Ambulation   Ambulation assist      Assist level: Moderate Assistance - Patient 50 - 74% Assistive device: Walker-rolling Max distance: 92f   Walk 10 feet  activity   Assist     Assist level: Moderate Assistance - Patient - 50 - 74% Assistive device: Walker-rolling   Walk 50 feet activity   Assist Walk 50 feet with 2 turns activity did not occur: Safety/medical concerns (fatigue, weakness/deconditioning)         Walk 150 feet activity   Assist Walk 150 feet activity did not occur: Safety/medical concerns (fatigue, weakness/deconditioning)         Walk 10 feet on uneven surface  activity   Assist Walk 10 feet on uneven surfaces activity did not occur: Safety/medical concerns (fatigue, weakness/deconditioning)         Wheelchair     Assist Is the patient using a wheelchair?: Yes Type of Wheelchair: Manual Wheelchair activity did not occur: Safety/medical concerns (fatigue,  weakness/deconditioning)         Wheelchair 50 feet with 2 turns activity    Assist    Wheelchair 50 feet with 2 turns activity did not occur: Safety/medical concerns (fatigue, weakness/deconditioning)       Wheelchair 150 feet activity     Assist  Wheelchair 150 feet activity did not occur: Safety/medical concerns (fatigue, weakness/deconditioning)       Blood pressure (!) 148/81, pulse (!) 40, temperature (!) 97.4 F (36.3 C), resp. rate 15, height '5\' 3"'$  (1.6 m), weight 72.5 kg, SpO2 93 %.  Medical Problem List and Plan: 1. Functional deficits secondary to left MCA infarct/M3 occlusion status post TNK with revascularization.  ICH right frontal small hematoma likely hemorrhagic transformation status post TNK.Marland KitchenResume Eliquis in 1-2 days.              -patient may  shower             -ELOS/Goals: 02/28/22 minA  Updated daughter  Start B complex with vitamin C tablet  Continue CIR- PT, OT and SLP  2.  Antithrombotics: -DVT/anticoagulation:  Mechanical: Antiembolism stockings, thigh (TED hose) Bilateral lower extremities             -antiplatelet therapy: Aspirin 81 mg daily 3. Pain Management: Tylenol as needed  9/2- Denies pain- con't regimen 4. Mood/Behavior/Sleep: Prozac 10 mg daily, trazodone as needed  9/3- d/c trazodone- too sleepy and give melatonin 3 mg QHS             -antipsychotic agents: N/A 5. Neuropsych/cognition: This patient is not capable of making decisions on her own behalf. 6. Skin/Wound Care: Routine skin checks 7. Fluids/Electrolytes/Nutrition: Routine in and outs with follow-up chemistries 8.  Atrial fibrillation.  Chronic Eliquis on hold.  Lopressor 12.5 mg twice daily, Cozaar 25 mg daily.  Cardiac rate controlled 9.  History of severe mitral regurgitation status post MitraClip 11/2020 10.  Diastolic congestive heart failure.  Lasix 20 mg daily.  Monitor for any signs of fluid overload -Weights appears stable overall Filed Weights   02/18/22  0331 02/19/22 0416 02/20/22 0322  Weight: 71.8 kg 74.5 kg 72.5 kg    11.  Diabetes mellitus.  Hemoglobin A1c 6.6.  SSI.  Patient on Farxiga 5 mg daily prior to admission.  Resume as needed  -9/4 CBGs fairly well controlled- continue current medications 12.  Hyperlipidemia.  Zetia 13.  GERD.  Protonix 14.  Epistaxis.  Packed by Dr. Hope Budds while at [facility] 15. Suboptimal vitamin D: start 1,000U daily 16. Hypokalemia: increase daily potassium supplement to 84mq daily, repeat potassium on Monday  -repeat BMP ordered  -8/4late addendum K+ 3.4, give additional 239m KCL 17. New left lung  opacity which could be developing pneumonia: check procalcitonin level. 9/3- coughing with taking pills- asked nursing/PT to have SLP reassess.   18. Bradycardia: resolved d/ced the nebulizer treatments, likely due to vagal response from treatments.   -9/4 Will stop metoprolol due to bradycardia 19. Sputum with flecks of blood: continue to hold Eliquis for 2-3 more days. Asked nursing to tell me if they note any more blood in sputum.  20. Lethargy improved: consulted cardiology given associated bradycardia, Toprol XL changed to HS  9/2- woke up with therapies yesterday  9/3 more awake today- but didn't sleep overnight, so wants to go back to sleep  9/4 Alert and awake this AM-improved    LOS: 7 days A FACE TO FACE EVALUATION WAS PERFORMED  Jennye Boroughs 02/20/2022, 8:18 AM

## 2022-02-20 NOTE — Progress Notes (Signed)
Occupational Therapy Session Note  Patient Details  Name: Linda [name] MRN: [id number] Date of Birth: [birth year]  Today's Date: 02/20/2022 OT Individual Time: 3762-8315 OT Individual Time Calculation (min): 39 min    Short Term Goals: Week 1:  OT Short Term Goal 1 (Week 1): Pt will complete 1/3 toileting steps with no more than CGA for balance OT Short Term Goal 2 (Week 1): Pt will complete LB dressing with CGA OT Short Term Goal 3 (Week 1): Pt will imrpove room air cardiorespiratory endurance for 2 consecutive sessions with SPO2 WNLs  Skilled Therapeutic Interventions/Progress Updates:  Pt greeted supine in bed, pt unable to state name even with choices provided. Pt initiated supine>sit with supervision and when asked if she needed to go to bathroom pt reports "yes." Pt completed sit>stand from EOB with MINA, ambulatory toilet transfer from EOB>toilet with RW and MINA, MIN A needed for RW mgmt and balance assist when stepping over threshold of bathroom. Pt completed 3/3 toileting tasks with MIN A needing assist for clothing mgmt in standing.        Pt exited bathroom with MINA with Rw, pt able to be redirected to sit in w/c. Rolled pt up to sink where pt completed seated hand hygiene with MIN verbal cues needed for sequencing steps such as reaching for soap and reaching for paper towels.  Transported pt to gym with total A where pt completed stand pivot to EOM with Rw and MINA for balance and MIN verbal cues for safety and sequencing pivotal steps.  Attempted to work on on [facility] and attention with pt instructed to organize all plastic bugs on table by color. Pt able to accurately locate 2 yellow bugs when asked but then noted to incorrectly locate remainder of bugs.           Pt then noted to lower her head and appear to be sleeping. Pt denied fatigue, pain, dizziness or being sad but when asked if she didn't want to do this she said "yes." Assessed BP form mat: 124/75 ( 91) HR 74   Pt  completed stand pivot back to w/c with Rw and MINA. Total A transport back to room where pt noted to become liable stating "I'm sorry" provided emotional support with pt able to be redirected back to EOB with MIN A with Rw. Pt left supine in bed with bed alarm activated and all needs within reach. [facility] hired companion/sitter left in room with pt, confirmed with charge nurse duty of Photographer.   Therapy Documentation Precautions:  Precautions Precautions: Fall, Other (comment) Precaution Comments: HOH, aphaisa Restrictions Weight Bearing Restrictions: No  Pain: No indications of pain noted.     Therapy/Group: Individual Therapy  Corinne Ports [name] 02/20/2022, 4:08 PM

## 2022-02-21 LAB — GLUCOSE, CAPILLARY: Glucose-Capillary: 119 mg/dL — ABNORMAL HIGH (ref 70–99)

## 2022-02-21 MED ORDER — FUROSEMIDE 20 MG PO TABS
10.0000 mg | ORAL_TABLET | Freq: Once | ORAL | Status: AC
  Administered 2022-02-21: 10 mg via ORAL
  Filled 2022-02-21: qty 1

## 2022-02-21 MED ORDER — APIXABAN 2.5 MG PO TABS
2.5000 mg | ORAL_TABLET | Freq: Two times a day (BID) | ORAL | Status: DC
  Administered 2022-02-21 – 2022-02-23 (×5): 2.5 mg via ORAL
  Filled 2022-02-21 (×5): qty 1

## 2022-02-21 NOTE — Progress Notes (Addendum)
Speech Language Pathology Daily Session Note  Patient Details  Name: Linda [name] MRN: [id number] Date of Birth: Feb 05, 1934  Today's Date: 02/21/2022 SLP Individual Time: 4301-4840 SLP Individual Time Calculation (min): 55 min  Short Term Goals: Week 1: SLP Short Term Goal 1 (Week 1): Pt will repeat CVC, CV, VC word combinations with 60% accuracy given Max A multimodal cues. - 40% of opportunities given overall Mod A verbal and visual cues during naming task on [employer] application.  SLP Short Term Goal 2 (Week 1): Pt will utilize multimodal communication to convey wants and needs x 8 with Set-Up A and/or question prompts. X 8 with use of single words and hand gestures (pointing) with Sup A ("pee," "throw away," "water,"); x 10 with Min-Mod A verbal and visual cues (benefits from use of written choice prompts and yes/no questions with low-tech AAC picture board to clarify wants and needs).   SLP Short Term Goal 3 (Week 1): Pt will answer environmental and semi-complex yes/no questions with 60% accuracy given Max A multimodal cues. - Answered yes/no questions re: environment with ~80% accuracy given Min-Mod A multimodal cues.  SLP Short Term Goal 4 (Week 1): Pt will follow one-step commands on 80% of opportunities given Mod A multimodal cues. - Followed one-step commands on ~80% of opportunities given Mod A multimodal cues.  SLP Short Term Goal 5 (Week 1): Pt will participate in further reading and writing assessment to aid in d/c planning and ST POC with 100% completion. - 71% accuracy with phrase to picture matching given Min A verbal cues.   Skilled Therapeutic Interventions: S: Pt seen this date for skilled ST intervention targeting communication goals outlined above. Pt received sleeping in bed; easily aroused to voice. Agreeable to ST intervention at bedside via head nod. Offered to don hearing aids and glasses; however, pt was adamantly against using, stating that none of the  devices worked.  O: Please see above for objective data re: pt's performance on targeted goals. Provided Total A for calling daughter and Min-Mod A verbal cues for participating in functional conversation with daughter re: her wants for daughter and son to come visit and that she missed them. Pt's daughter verbalized that she would come later this date. Pt appreciative of assistance. Less perseverative this session.  A: Pt remains sitmulable for skilled ST intervention as evident by improvement in participation of structured language tasks with use of naming hierarchy cues.   P: Pt left in direct care of NT for toileting needs. Call bell reviewed and within reach and all immediate needs met. Continue per current ST POC next session.    Pain Pain Assessment Pain Scale: 0-10 Pain Score: 0-No pain  Therapy/Group: Individual Therapy  Esther Bradstreet A Essica Kiker 02/21/2022, 1:35 PM

## 2022-02-21 NOTE — Progress Notes (Addendum)
Patient ID: Linda [name], female   DOB: 01-04-1934, 88 y.o.   MRN: [id number]  Vm left for Geralyn Flash, SW at [facility] to discuss patient tranfer. Sw will wait for follow up.   SW received follow up, SW discussion transfer to SNF with management and will follow up with SW. In discussion about d/c 9/12 or before, based on facility decision and availability.

## 2022-02-21 NOTE — Progress Notes (Signed)
Occupational Therapy Session Note  Patient Details  Name: Linda [name] MRN: [id number] Date of Birth: 06-Nov-1933  Today's Date: 02/21/2022 OT Individual Time: 0920-1000 OT Individual Time Calculation (min): 40 min    Short Term Goals: Week 1:  OT Short Term Goal 1 (Week 1): Pt will complete 1/3 toileting steps with no more than CGA for balance OT Short Term Goal 2 (Week 1): Pt will complete LB dressing with CGA OT Short Term Goal 3 (Week 1): Pt will imrpove room air cardiorespiratory endurance for 2 consecutive sessions with SPO2 WNLs  Skilled Therapeutic Interventions/Progress Updates:  Skilled OT intervention completed with focus on cognitive strategies needed for sequencing with ADL management and emotional support. Pt received seated in recliner, slumped over asleep. No c/o pain indicated or observed during session. Initially was labile, with tearfulness regarding therapist closing door however with light conversation and door remaining open, pt tolerating therapist's presence.   Pt declined self care needs at this time, in agreement for in room activity. Seated in recliner, pt participated in low level cog tasks including coloring a bird on piece of paper with pt able to select desired color with accurate verbal report of "red" when selecting red. Coloring appeared to calm pt, with therapist leaving items in room for occupying/calming pt when left in room alone. Transitioned to mini peg activity with pt able to follow simple design without cues, with only 1 error in placement and 1 error with not enough pegs placed.   Nursing in room to administer meds, with pt initially resistant however with therapist encouragement and verbal aid during administration was willing to consume all meds needed.  Completed sit > stand with CGA using RW then CGA short ambulatory transfer to EOB, then side step towards [facility] for proper bed positioning. Supervision for bed mobility to in bed. Pt remained upright in  bed with nursing student present to assist pt with retrieving all needs prior to exit.   Therapy Documentation Precautions:  Precautions Precautions: Fall, Other (comment) Precaution Comments: HOH, aphaisa Restrictions Weight Bearing Restrictions: No    Therapy/Group: Individual Therapy  Blase Mess, MS, OTR/L  02/21/2022, 12:04 PM

## 2022-02-21 NOTE — Progress Notes (Signed)
PROGRESS NOTE   Subjective/Complaints: Sleepy this morning Appreciate SW assistance regarding SNF Ambulating 14 feet with therapy Min/ModA Decreased motivation  ROS: Limited due to aphasia   Objective:   No results found.  Recent Labs    02/20/22 1505  WBC 6.8  HGB 15.6*  HCT 46.3*  PLT 210   Recent Labs    02/20/22 1505  NA 137  K 3.4*  CL 100  CO2 26  GLUCOSE 155*  BUN 11  CREATININE 0.81  CALCIUM 9.2    Intake/Output Summary (Last 24 hours) at 02/21/2022 1044 Last data filed at 02/21/2022 0809 Gross per 24 hour  Intake 240 ml  Output --  Net 240 ml        Physical Exam: Vital Signs Blood pressure (!) 160/91, pulse 63, temperature 98.4 F (36.9 C), temperature source Oral, resp. rate 20, height '5\' 3"'$  (1.6 m), weight 73.3 kg, SpO2 96 %.    General: awake, alert, appropriate, sitting up in bed, BMI 28.63 HENT: conjugate gaze; oropharynx moist BM:[id number] rate; no JVD Pulmonary: CTAB, no RRW  GI: soft, NT, ND, (+)BS Psychiatric: appropriate- flat Neurological: aphasic- saying a few words-  Skin: No evidence of breakdown, no evidence of rash Neurologic: Cranial nerves II through XII intact, motor strength is 5/5 in Left 4+/5 Right deltoid, bicep, tricep, grip, hip flexor, knee extensors, ankle dorsiflexor and plantar flexor Responding to questions better than yesterday Sensory exam Withdraws to pinch Severe aphasia, unable to follow simple commands without gestural cues, per daughter also HOH   Musculoskeletal: no jt deformities No joint swelling  Psych: anxious and agitated at times   Assessment/Plan: 1. Functional deficits which require 3+ hours per day of interdisciplinary therapy in a comprehensive inpatient rehab setting. Physiatrist is providing close team supervision and 24 hour management of active medical problems listed below. Physiatrist and rehab team continue to assess barriers  to discharge/monitor patient progress toward functional and medical goals  Care Tool:  Bathing    Body parts bathed by patient: Right arm, Left arm, Chest, Abdomen, Front perineal area, Buttocks, Right upper leg, Left upper leg, Face, Right lower leg, Left lower leg   Body parts bathed by helper: Right lower leg, Left lower leg     Bathing assist Assist Level: Contact Guard/Touching assist     Upper Body Dressing/Undressing Upper body dressing   What is the patient wearing?: Pull over shirt    Upper body assist Assist Level: Contact Guard/Touching assist    Lower Body Dressing/Undressing Lower body dressing      What is the patient wearing?: Incontinence brief, Pants     Lower body assist Assist for lower body dressing: Minimal Assistance - Patient > 75%     Toileting Toileting    Toileting assist Assist for toileting: Minimal Assistance - Patient > 75%     Transfers Chair/bed transfer  Transfers assist  Chair/bed transfer activity did not occur: Safety/medical concerns  Chair/bed transfer assist level: Contact Guard/Touching assist     Locomotion Ambulation   Ambulation assist      Assist level: Moderate Assistance - Patient 50 - 74% Assistive device: Walker-rolling Max distance: 45f   Walk  10 feet activity   Assist     Assist level: Moderate Assistance - Patient - 50 - 74% Assistive device: Walker-rolling   Walk 50 feet activity   Assist Walk 50 feet with 2 turns activity did not occur: Safety/medical concerns (fatigue, weakness/deconditioning)         Walk 150 feet activity   Assist Walk 150 feet activity did not occur: Safety/medical concerns (fatigue, weakness/deconditioning)         Walk 10 feet on uneven surface  activity   Assist Walk 10 feet on uneven surfaces activity did not occur: Safety/medical concerns (fatigue, weakness/deconditioning)         Wheelchair     Assist Is the patient using a wheelchair?:  Yes Type of Wheelchair: Manual Wheelchair activity did not occur: Safety/medical concerns (fatigue, weakness/deconditioning)         Wheelchair 50 feet with 2 turns activity    Assist    Wheelchair 50 feet with 2 turns activity did not occur: Safety/medical concerns (fatigue, weakness/deconditioning)       Wheelchair 150 feet activity     Assist  Wheelchair 150 feet activity did not occur: Safety/medical concerns (fatigue, weakness/deconditioning)       Blood pressure (!) 160/91, pulse 63, temperature 98.4 F (36.9 C), temperature source Oral, resp. rate 20, height '5\' 3"'$  (1.6 m), weight 73.3 kg, SpO2 96 %.  Medical Problem List and Plan: 1. Functional deficits secondary to left MCA infarct/M3 occlusion status post TNK with revascularization.  ICH right frontal small hematoma likely hemorrhagic transformation status post TNK.Marland KitchenResume Eliquis in 1-2 days.              -patient may  shower             -ELOS/Goals: 02/28/22 minA  Updated daughter  Start B complex with vitamin C tablet  Continue CIR- PT, OT and SLP  2.  Antithrombotics: -DVT/anticoagulation:  Mechanical: Antiembolism stockings, thigh (TED hose) Bilateral lower extremities             -antiplatelet therapy: Aspirin 81 mg daily 3. Pain Management: Tylenol as needed  9/2- Denies pain- con't regimen 4. Mood/Behavior/Sleep: Prozac 10 mg daily, trazodone as needed  9/3- d/c trazodone- too sleepy and give melatonin 3 mg QHS             -antipsychotic agents: N/A 5. Neuropsych/cognition: This patient is not capable of making decisions on her own behalf. 6. Skin/Wound Care: Routine skin checks 7. Fluids/Electrolytes/Nutrition: Routine in and outs with follow-up chemistries 8.  Atrial fibrillation.  Restart Eliquis at 2.'5mg'$  BID. Lopressor stopped due to bradycardia, Cozaar 25 mg daily.  Cardiac rate controlled 9.  History of severe mitral regurgitation status post MitraClip 11/2020 10.  Diastolic congestive heart  failure.  Lasix 20 mg daily.  Monitor for any signs of fluid overload -Weight increased 9/5, add additional '10mg'$  Lasix today Filed Weights   02/19/22 0416 02/20/22 0322 02/21/22 0357  Weight: 74.5 kg 72.5 kg 73.3 kg    11.  Diabetes mellitus.  Hemoglobin A1c 6.6.  SSI.  Patient on Farxiga 5 mg daily prior to admission.  Resume as needed  -9/4 CBGs fairly well controlled- continue current medications 12.  Hyperlipidemia.  Zetia 13.  GERD.  Protonix 14.  Epistaxis.  Packed by Dr. Hope Budds while at [facility] 15. Suboptimal vitamin D: start 1,000U daily 16. Hypokalemia: increase daily potassium supplement to 11mq daily, repeat potassium on Monday  -repeat BMP ordered  -8/4late addendum K+ 3.4, give  additional 7mq KCL 17. New left lung opacity which could be developing pneumonia: check procalcitonin level. 9/3- coughing with taking pills- asked nursing/PT to have SLP reassess.   18. Bradycardia: resolved d/ced the nebulizer treatments, likely due to vagal response from treatments.   -9/4 Will stop metoprolol due to bradycardia 19. Sputum with flecks of blood: monitor while restarting Eliquis today. Asked nursing to tell me if they note any more blood in sputum.  20. Lethargy improved: consulted cardiology given associated bradycardia, Toprol XL changed to HS  9/2- woke up with therapies yesterday  9/3 more awake today- but didn't sleep overnight, so wants to go back to sleep  9/4 Alert and awake this AM-improved    LOS: 8 days A FACE TO FACE EVALUATION WAS PERFORMED  KMartha ClanP Brittanya Winburn 02/21/2022, 10:44 AM

## 2022-02-21 NOTE — Progress Notes (Signed)
Physical Therapy Weekly Progress Note  Patient Details  Name: Linda [name] MRN: [id number] Date of Birth: March 22, 1934  Beginning of progress report period: February 14, 2022 End of progress report period: February 21, 2022  Today's Date: 02/21/2022 PT Individual Time: 0730-0810 PT Individual Time Calculation (min): 40 min   Patient has met 1 of 3 short term goals. Pt demonstrates very slow and limited progress towards long term goals. Pt is currently able to perform bed mobility with supervision, transfers with RW and CGA/min A, and ambulate 2f with RW and min/mod A. Pt currently demonstrates spontaneous knee buckling in stance, requiring assist to correct. Pt is limited by continued expressive aphasia, decreased motivation and emotional lability, decreased endurance, and generalized weakness. Pt requires cues for RW safety, transfer technique, and sequencing/motor planning. Pt's POC adjusted to 15/7 on 9/4 due to poor activity tolerance and current recommendations are for SNF due to deficits listed above.   Patient continues to demonstrate the following deficits muscle weakness, decreased cardiorespiratoy endurance, impaired timing and sequencing, unbalanced muscle activation, decreased coordination, and decreased motor planning, decreased awareness, decreased problem solving, and decreased safety awareness, and decreased standing balance, decreased postural control, hemiplegia, and decreased balance strategies and therefore will continue to benefit from skilled PT intervention to increase functional independence with mobility.  Patient progressing toward long term goals..  Continue plan of care.  PT Short Term Goals Week 1:  PT Short Term Goal 1 (Week 1): pt will perform bed mobility with CGA overall PT Short Term Goal 1 - Progress (Week 1): Met PT Short Term Goal 2 (Week 1): pt will transfer bed<>chair with LRAD and supervision PT Short Term Goal 2 - Progress (Week 1): Progressing toward  goal PT Short Term Goal 3 (Week 1): pt will ambulate 260fwith LRAD and CGA PT Short Term Goal 3 - Progress (Week 1): Progressing toward goal Week 2:  PT Short Term Goal 1 (Week 2): STG=LTG due to LOS  Skilled Therapeutic Interventions/Progress Updates:  Ambulation/gait training;Discharge planning;Functional mobility training;Psychosocial support;Therapeutic Activities;Visual/perceptual remediation/compensation;Balance/vestibular training;Disease management/prevention;Neuromuscular re-education;Skin care/wound management;Therapeutic Exercise;Wheelchair propulsion/positioning;Cognitive remediation/compensation;DME/adaptive equipment instruction;Pain management;Splinting/orthotics;UE/LE Strength taining/ROM;Community reintegration;Patient/family education;Stair training;UE/LE Coordination activities   Today's Interventions: Received pt long sitting in bed patting brief indicating need to void - pt with continued expressive aphasia. Session with emphasis on functional mobility/transfers, toileting, and ambulation. Pt transferred long sitting<>sitting EOB with HOB elevated and supervision and donned shoes with max A. Stood with RW and min A and ambulated 1219f 2 trials with RW and min A in/out of bathroom - pt required manual facilitation for RW safety as pt pushes RW too far forward. Pt also with multiple instances of knee flexion requiring heavy min A to correct and cues for upright posture. Pt able to manage clothing standing with CGA and void and perform peri-care with CGA. Of note, pt requires cues for hand placement when standing.  Guided pt to sit in recliner upon exiting bathroom as pt stating "no, no" and attempting to return to bed. NT arrived with breakfast tray and encouraged pt to sit in recliner to eat. Turned on lights and opened windows for arousal and pt ate 75% of grits, 100% bacon, 100% peaches, and a few bites of eggs and drank milk - occasional coughing but improved with cues to slow  down. Concluded session with pt sitting in recliner brushing teeth with student RN, needs within reach, and seatbelt alarm on. Provided pt with warm blanket and fresh ice water.  Therapy Documentation  Precautions:  Precautions Precautions: Fall, Other (comment) Precaution Comments: HOH, aphaisa Restrictions Weight Bearing Restrictions: No  Therapy/Group: Individual Therapy Alfonse Alpers PT, DPT  02/21/2022, 6:54 AM

## 2022-02-21 NOTE — Plan of Care (Signed)
  Problem: RH Tub/Shower Transfers Goal: LTG Patient will perform tub/shower transfers w/assist (OT) Description: LTG: Patient will perform tub/shower transfers with assist, with/without cues using equipment (OT) Outcome: Not Applicable Flowsheets (Taken 02/21/2022 1528) LTG: Pt will perform tub/shower stall transfers with assistance level of: (Discontinued goal 2/2 pt now d/c SNF and pt declining taking shower) --   Problem: RH Balance Goal: LTG Patient will maintain dynamic standing with ADLs (OT) Description: LTG:  Patient will maintain dynamic standing balance with assist during activities of daily living (OT)  Flowsheets (Taken 02/21/2022 1528) LTG: Pt will maintain dynamic standing balance during ADLs with: (downgraded 2/2 decrease in activity tolerance/cog and carryover as well as increased lability limiting command following) Supervision/Verbal cueing   Problem: Sit to Stand Goal: LTG:  Patient will perform sit to stand in prep for activites of daily living with assistance level (OT) Description: LTG:  Patient will perform sit to stand in prep for activites of daily living with assistance level (OT) Flowsheets (Taken 02/21/2022 1528) LTG: PT will perform sit to stand in prep for activites of daily living with assistance level: (downgraded 2/2 decrease in activity tolerance/cog and carryover as well as increased lability limiting command following) Supervision/Verbal cueing   Problem: RH Bathing Goal: LTG Patient will bathe all body parts with assist levels (OT) Description: LTG: Patient will bathe all body parts with assist levels (OT) Flowsheets (Taken 02/21/2022 1528) LTG: Pt will perform bathing with assistance level/cueing: (downgraded 2/2 decrease in activity tolerance/cog and carryover as well as increased lability limiting command following) Contact Guard/Touching assist   Problem: RH Dressing Goal: LTG Patient will perform upper body dressing (OT) Description: LTG Patient will  perform upper body dressing with assist, with/without cues (OT). Flowsheets (Taken 02/21/2022 1528) LTG: Pt will perform upper body dressing with assistance level of: (downgraded 2/2 decrease in activity tolerance/cog and carryover as well as increased lability limiting command following) Set up assist   Problem: RH Toileting Goal: LTG Patient will perform toileting task (3/3 steps) with assistance level (OT) Description: LTG: Patient will perform toileting task (3/3 steps) with assistance level (OT)  Flowsheets (Taken 02/21/2022 1528) LTG: Pt will perform toileting task (3/3 steps) with assistance level: (downgraded 2/2 decrease in activity tolerance/cog and carryover as well as increased lability limiting command following) Contact Guard/Touching assist

## 2022-02-21 NOTE — Progress Notes (Signed)
Occupational Therapy Weekly Progress Note  Patient Details  Name: Linda [name] MRN: [id number] Date of Birth: May 21, 1934  Beginning of progress report period: February 14, 2022 End of progress report period: February 21, 2022  Patient has met 1 of 3 short term goals.  Pt is making slow progress towards LTGs. She is currently at a short, in room, ambulatory level with CGA using RW, is able to bathe at an overall min A level, dress at an overall min A level and requires up to min assist for toileting tasks. ADLs are mostly limited by pt's lability and global aphasia impacting communication/command following. Sequencing, memory and coordination are also limiting pt's independence. Pt does fluctuate and has the potential to improve, however is difficult to redirect at times towards purposeful tasks and will require greater assist than anticipated upon eval. D/c plan is now to recommend SNF prior to ILF return to maximize safety and independence with ambulation and daily tasks.   Patient continues to demonstrate the following deficits: muscle weakness, decreased cardiorespiratoy endurance, decreased coordination, decreased initiation, decreased attention, decreased awareness, decreased problem solving, decreased safety awareness, decreased memory, and delayed processing, and decreased standing balance and therefore will continue to benefit from skilled OT intervention to enhance overall performance with BADL and Reduce care partner burden.  Patient not progressing toward long term goals.  See goal revision..  Plan of care revisions: From grossly mod I to CGA/min A for ADLs.  OT Short Term Goals Week 1:  OT Short Term Goal 1 (Week 1): Pt will complete 1/3 toileting steps with no more than CGA for balance OT Short Term Goal 1 - Progress (Week 1): Not met OT Short Term Goal 2 (Week 1): Pt will complete LB dressing with CGA OT Short Term Goal 2 - Progress (Week 1): Not met OT Short Term Goal 3 (Week 1): Pt  will imrpove room air cardiorespiratory endurance for 2 consecutive sessions with SPO2 WNLs OT Short Term Goal 3 - Progress (Week 1): Met Week 2:  OT Short Term Goal 1 (Week 2): STG = LTG 2/2 ELOS   Brenya Taulbee E Dwanna Goshert, MS, OTR/L  02/21/2022, 3:20 PM

## 2022-02-22 LAB — GLUCOSE, CAPILLARY: Glucose-Capillary: 139 mg/dL — ABNORMAL HIGH (ref 70–99)

## 2022-02-22 MED ORDER — POTASSIUM CHLORIDE CRYS ER 20 MEQ PO TBCR
40.0000 meq | EXTENDED_RELEASE_TABLET | Freq: Every day | ORAL | Status: DC
  Administered 2022-02-22 – 2022-02-27 (×4): 40 meq via ORAL
  Filled 2022-02-22 (×5): qty 2

## 2022-02-22 NOTE — Progress Notes (Signed)
Speech Language Pathology Weekly Progress and Session Note  Patient Details  Name: LIBERTIE HAUSLER MRN: [id number] Date of Birth: 1934/03/01  Beginning of progress report period: February 14, 2022 End of progress report period: February 22, 2022  Today's Date: 02/22/2022 SLP Individual Time: 0900-1000 SLP Individual Time Calculation (min): 60 min  Short Term Goals: Week 1: SLP Short Term Goal 1 (Week 1): Pt will repeat CVC, CV, VC word combinations with 60% accuracy given Max A multimodal cues. SLP Short Term Goal 1 - Progress (Week 1): Met SLP Short Term Goal 2 (Week 1): Pt will utilize multimodal communication to convey wants and needs x 8 with Set-Up A and/or question prompts. SLP Short Term Goal 2 - Progress (Week 1): Met SLP Short Term Goal 3 (Week 1): Pt will answer environmental and semi-complex yes/no questions with 60% accuracy given Max A multimodal cues. SLP Short Term Goal 3 - Progress (Week 1): Met SLP Short Term Goal 4 (Week 1): Pt will follow one-step commands on 80% of opportunities given Mod A multimodal cues. SLP Short Term Goal 4 - Progress (Week 1): Met SLP Short Term Goal 5 (Week 1): Pt will participate in further reading and writing assessment to aid in d/c planning and ST POC with 100% completion. SLP Short Term Goal 5 - Progress (Week 1): Met    New Short Term Goals: Week 2: SLP Short Term Goal 1 (Week 2): STG's = LTG's due to ELOS  Weekly Progress Updates: This reporting period, pt has demonstrated functional gains as evident by meeting 5 out of 5 short-term goals outlined above. Family has not been present for family education, with pt education limited due to severity of pt's language deficits. Pt has been approved for SNF placement. Barriers that remain include severity of deficits, limited family support, lability, and decreased motivation. Recommend ST intervention at next venue of care.  Intensity: Minumum of 1-2 x/day, 30 to 90 minutes Frequency: 3 to 5 out  of 7 days Duration/Length of Stay: 9/12 Treatment/Interventions: Cueing hierarchy;Speech/Language facilitation;Patient/family education;Therapeutic Activities  Daily Session Skilled Therapeutic Interventions:  S: Pt seen this date for skilled ST intervention targeting communication goals outlined above. Pt received awake/alert and sitting in bed. Verbally perseverative at times. Agreeable to ST intervention in hospital room.  O: SLP facilitated today's session by providing the following skilled ST interventions: - Verbally produced CVC and CV word structures on 50% of trial given Max A multimodal cues to include tactile and verbal articulatory placement prompts and oral posture cues/model prompts with Min A verbal cues to participate in "watch"/attend to clinician model. - Counted to 3 with Min A verbal cues; to 10 with <50% accuracy despite Max A multimodal cues. - Communicated wants and needs via gesturing and facial expressions predominantly; Mod A verbal and visual/gesture cues for auditory comprehension of more semi-complex information. Expressed wants for lunch and dinner with Mod-Max A verbal and visual/written choice prompts to select desire items vs verbal alone when food ambassador was attempting to get pt's food order. - Benefits from use of low-tech AAC picture board to confirm her wants and desires (e.g. pointing to picture of chair and bed - do you want to get the in the chair or stay in bed."). It should be noted that pt's motivation does fluctuate.  A: Pt minimally sitmulable for skilled ST intervention as evident by subtle improvement in participation of structured language tasks with use of naming hierarchy cues. Performance appears to vary from day to day.  P: Pt left in chair with all safety measures activated. Call bell reviewed and within reach and all immediate needs met. Pt requesting to be moved back to bed via facial expression and gesturing; confirmed via use of low-tech  AAC picture board. Continue per current ST POC next session.      Pain Pain Assessment Pain Scale: 0-10 Pain Score: 0-No pain  Therapy/Group: Individual Therapy  Jaspal Pultz A Delbert Vu 02/22/2022, 12:18 PM

## 2022-02-22 NOTE — Progress Notes (Signed)
PROGRESS NOTE   Subjective/Complaints: Tearful this morning IV in right forearm can be removed Discussed that Eliquis is restarted SNF approved  ROS: Limited due to aphasia   Objective:   No results found.  Recent Labs    02/20/22 1505  WBC 6.8  HGB 15.6*  HCT 46.3*  PLT 210   Recent Labs    02/20/22 1505  NA 137  K 3.4*  CL 100  CO2 26  GLUCOSE 155*  BUN 11  CREATININE 0.81  CALCIUM 9.2    Intake/Output Summary (Last 24 hours) at 02/22/2022 1129 Last data filed at 02/22/2022 0736 Gross per 24 hour  Intake 560 ml  Output --  Net 560 ml        Physical Exam: Vital Signs Blood pressure (!) 145/79, pulse 84, temperature 98.2 F (36.8 C), temperature source Oral, resp. rate 20, height '5\' 3"'$  (1.6 m), weight 73.3 kg, SpO2 91 %.    General: awake, alert, appropriate, sitting up in bed, BMI 28.63 HENT: conjugate gaze; oropharynx moist AJ:OINOMVEHMCN rate; no JVD Pulmonary: CTAB, no RRW  GI: soft, NT, ND, (+)BS Psychiatric: appropriate- flat Neurological: aphasic- saying a few words-  Skin: No evidence of breakdown, no evidence of rash Neurologic: Cranial nerves II through XII intact, motor strength is 5/5 in Left 4+/5 Right deltoid, bicep, tricep, grip, hip flexor, knee extensors, ankle dorsiflexor and plantar flexor Responding to questions better than yesterday Sensory exam Withdraws to pinch Severe aphasia, unable to follow simple commands without gestural cues, per daughter also [facility]   Musculoskeletal: no jt deformities No joint swelling  Psych: anxious and agitated at times, tearful about situation this morning   Assessment/Plan: 1. Functional deficits which require 3+ hours per day of interdisciplinary therapy in a comprehensive inpatient rehab setting. Physiatrist is providing close team supervision and 24 hour management of active medical problems listed below. Physiatrist and rehab team  continue to assess barriers to discharge/monitor patient progress toward functional and medical goals  Care Tool:  Bathing    Body parts bathed by patient: Right arm, Left arm, Chest, Abdomen, Front perineal area, Buttocks, Right upper leg, Left upper leg, Face, Right lower leg, Left lower leg   Body parts bathed by helper: Right lower leg, Left lower leg     Bathing assist Assist Level: Contact Guard/Touching assist     Upper Body Dressing/Undressing Upper body dressing   What is the patient wearing?: Pull over shirt    Upper body assist Assist Level: Contact Guard/Touching assist    Lower Body Dressing/Undressing Lower body dressing      What is the patient wearing?: Incontinence brief, Pants     Lower body assist Assist for lower body dressing: Minimal Assistance - Patient > 75%     Toileting Toileting    Toileting assist Assist for toileting: Moderate Assistance - Patient 50 - 74%     Transfers Chair/bed transfer  Transfers assist  Chair/bed transfer activity did not occur: Safety/medical concerns  Chair/bed transfer assist level: Contact Guard/Touching assist     Locomotion Ambulation   Ambulation assist      Assist level: Minimal Assistance - Patient > 75% Assistive device: Walker-rolling  Max distance: 53f   Walk 10 feet activity   Assist     Assist level: Minimal Assistance - Patient > 75% Assistive device: Walker-rolling   Walk 50 feet activity   Assist Walk 50 feet with 2 turns activity did not occur: Safety/medical concerns (fatigue, weakness/deconditioning)         Walk 150 feet activity   Assist Walk 150 feet activity did not occur: Safety/medical concerns (fatigue, weakness/deconditioning)         Walk 10 feet on uneven surface  activity   Assist Walk 10 feet on uneven surfaces activity did not occur: Safety/medical concerns (fatigue, weakness/deconditioning)         Wheelchair     Assist Is the patient  using a wheelchair?: Yes Type of Wheelchair: Manual Wheelchair activity did not occur: Safety/medical concerns (fatigue, weakness/deconditioning)         Wheelchair 50 feet with 2 turns activity    Assist    Wheelchair 50 feet with 2 turns activity did not occur: Safety/medical concerns (fatigue, weakness/deconditioning)       Wheelchair 150 feet activity     Assist  Wheelchair 150 feet activity did not occur: Safety/medical concerns (fatigue, weakness/deconditioning)       Blood pressure (!) 145/79, pulse 84, temperature 98.2 F (36.8 C), temperature source Oral, resp. rate 20, height '5\' 3"'$  (1.6 m), weight 73.3 kg, SpO2 91 %.  Medical Problem List and Plan: 1. Functional deficits secondary to left MCA infarct/M3 occlusion status post TNK with revascularization.  ICH right frontal small hematoma likely hemorrhagic transformation status post TNK..Marland Kitchenesume Eliquis in 1-2 days.              -patient may  shower             -ELOS/Goals: 02/28/22 minA  Updated daughter  Start B complex with vitamin C tablet  Continue CIR- PT, OT and SLP  -Interdisciplinary Team Conference today   2.  Antithrombotics: -DVT/anticoagulation:  Mechanical: Antiembolism stockings, thigh (TED hose) Bilateral lower extremities             -antiplatelet therapy: Aspirin 81 mg daily 3. Pain Management: Tylenol as needed  9/2- Denies pain- con't regimen 4. Mood/Behavior/Sleep: Prozac 10 mg daily, trazodone as needed  9/3- d/c trazodone- too sleepy and give melatonin 3 mg QHS             -antipsychotic agents: N/A 5. Neuropsych/cognition: This patient is not capable of making decisions on her own behalf. 6. Skin/Wound Care: Routine skin checks 7. Fluids/Electrolytes/Nutrition: Routine in and outs with follow-up chemistries 8.  Atrial fibrillation.  Restart Eliquis at 2.'5mg'$  BID. Lopressor stopped due to bradycardia, Cozaar 25 mg daily.  Cardiac rate controlled 9.  History of severe mitral  regurgitation status post MitraClip 11/2020 10.  Diastolic congestive heart failure.  Lasix 20 mg daily.  Monitor for any signs of fluid overload -Weight increased 9/5, add additional '10mg'$  Lasix today Filed Weights   02/19/22 0416 02/20/22 0322 02/21/22 0357  Weight: 74.5 kg 72.5 kg 73.3 kg    11.  Diabetes mellitus.  Hemoglobin A1c 6.6.  SSI.  Patient on Farxiga 5 mg daily prior to admission.  Resume as needed  -9/4 CBGs fairly well controlled- continue current medications 12.  Hyperlipidemia.  Zetia 13.  GERD.  Protonix 14.  Epistaxis.  [facility]. Suboptimal vitamin D: start 1,000U daily 16. Hypokalemia: increase daily potassium supplement to 466m daily, repeat potassium on Monday.  Changed medication to kdur.  73. New left lung opacity which could be developing pneumonia: check procalcitonin level. 9/3- coughing with taking pills- asked nursing/PT to have SLP reassess.   18. Bradycardia: resolved d/ced the nebulizer treatments, likely due to vagal response from treatments.   -9/4 Will stop metoprolol due to bradycardia 19. Sputum with flecks of blood: monitor while restarting Eliquis, discussed with nursing.  20. Lethargy improved: consulted cardiology given associated bradycardia, Toprol XL changed to HS. Trazodone discontinued 21. HTN: d/c teds     LOS: 9 days A FACE TO FACE EVALUATION WAS PERFORMED  Alyvia Derk P Malaak Stach 02/22/2022, 11:29 AM

## 2022-02-22 NOTE — Progress Notes (Signed)
Patient ID: Linda [name], female   DOB: 1933/11/14, 88 y.o.   MRN: [id number]  Team Conference Report to Patient/Family  Team Conference discussion was reviewed with the patient and caregiver, including goals, any changes in plan of care and target discharge date.  Patient and caregiver express understanding and are in agreement.  The patient has a target discharge date of 02/28/22.   Sw spoke with patient daughter, [name]. Sw informed [name] that [name] has approved the Snf transition and anticipate transfer on Tuesday 9/12. Daughter, [name] reports that this date will not work due to her traveling, daughter has expressed this to the facility. Sw reached out to discuss with facility. AD, [name] and DON, [name] have expressed that they are unable to take the patient on Friday. DON provided contact information for daughter to reach out. Daughter will reach out and follow up with SW.  [name] 02/22/2022, 11:12 AM

## 2022-02-22 NOTE — Progress Notes (Signed)
Physical Therapy Session Note  Patient Details  Name: Linda [name] MRN: [id number] Date of Birth: [birth year]  Today's Date: 02/22/2022 PT Individual Time: 0731-0813 PT Individual Time Calculation (min): 42 min   Short Term Goals: Week 1:  PT Short Term Goal 1 (Week 1): pt will perform bed mobility with CGA overall PT Short Term Goal 1 - Progress (Week 1): Met PT Short Term Goal 2 (Week 1): pt will transfer bed<>chair with LRAD and supervision PT Short Term Goal 2 - Progress (Week 1): Progressing toward goal PT Short Term Goal 3 (Week 1): pt will ambulate 25f with LRAD and CGA PT Short Term Goal 3 - Progress (Week 1): Progressing toward goal Week 2:  PT Short Term Goal 1 (Week 2): STG=LTG due to LOS  Skilled Therapeutic Interventions/Progress Updates:   Received pt long sitting in bed and with continued expressive aphasia. Session with emphasis on functional mobility/transfers, generalized strengthening and endurance, and gait training, and toileting. Pt transferred long sitting<>sitting EOB with HOB elevated and supervision and donned shoes with max A. Stood from EOB with RW and CGA (cues to push up from bed rather than pull up on RW) and ambulated to sink. Pt stood at sink for 4 minutes and brushed teeth with CGA and heavy reliance on 1 UE support - noted multiple episodes of mild knee buckling/bouncing but pt able to self-correct all but one instance where she required min A for balance. Sat in WC and brushed hair with set up assist. [facility].   Pt c/o smells in 2/3 gyms and refused to stay, therefore transported around unit to find area that did not smell. Sit<>stand with RW and CGA and pt ambulated 585fwith RW and CGA/min A with close WC follow and cues for upright posture and proximity to RW. Pt with continued episodes of mild knee buckling but improved when RW was closer. Pt whining "honey, please" entire time  indicating desire to stop, but when brought WC up for pt to sit, pt continued walking forward and required max multimodal cues/manual facilitation to step [facility].   In dayroom, pt performed seated BLE strengthening on Kinetron at 30 cm/sec for 2 minutes with emphasis on glute/quad strength - continued to whine "honey, please" throughout and slump forward. Returned to room and pt reported urge to use bathroom (patting at brief). Transferred to/from regular toilet with RW and CGA and pt able to manage clothing with CGA and void. Concluded session with pt sitting in WC, needs within reach, and seatbelt alarm on.  Therapy Documentation Precautions:  Precautions Precautions: Fall, Other (comment) Precaution Comments: HOH, aphaisa Restrictions Weight Bearing Restrictions: No  Therapy/Group: Individual Therapy AnAlfonse AlpersT, DPT  02/22/2022, 6:55 AM

## 2022-02-22 NOTE — Progress Notes (Signed)
Patient ID: Linda [name], female   DOB: [birth year], 88 y.o.   MRN: [id number]  Patient will discharge to SNF on Monday, 9/11

## 2022-02-22 NOTE — Plan of Care (Signed)
  Problem: RH Balance Goal: LTG Patient will maintain dynamic standing balance (PT) Description: LTG:  Patient will maintain dynamic standing balance with assistance during mobility activities (PT) Flowsheets (Taken 02/22/2022 0657) LTG: Pt will maintain dynamic standing balance during mobility activities with:: (downgraded due to poor activity tolerance, generalized weakness/deconditioning, and impaired motor planning/sequencing) Supervision/Verbal cueing Note: downgraded due to poor activity tolerance, generalized weakness/deconditioning, and impaired motor planning/sequencing   Problem: Sit to Stand Goal: LTG:  Patient will perform sit to stand with assistance level (PT) Description: LTG:  Patient will perform sit to stand with assistance level (PT) Flowsheets (Taken 02/22/2022 0657) LTG: PT will perform sit to stand in preparation for functional mobility with assistance level: (downgraded due to poor activity tolerance, generalized weakness/deconditioning, and impaired motor planning/sequencing) Supervision/Verbal cueing Note: downgraded due to poor activity tolerance, generalized weakness/deconditioning, and impaired motor planning/sequencing   Problem: RH Bed to Chair Transfers Goal: LTG Patient will perform bed/chair transfers w/assist (PT) Description: LTG: Patient will perform bed to chair transfers with assistance (PT). Flowsheets (Taken 02/22/2022 0657) LTG: Pt will perform Bed to Chair Transfers with assistance level: (downgraded due to poor activity tolerance, generalized weakness/deconditioning, and impaired motor planning/sequencing) Supervision/Verbal cueing Note: downgraded due to poor activity tolerance, generalized weakness/deconditioning, and impaired motor planning/sequencing   Problem: RH Ambulation Goal: LTG Patient will ambulate in controlled environment (PT) Description: LTG: Patient will ambulate in a controlled environment, # of feet with assistance (PT). Flowsheets  (Taken 02/22/2022 0657) LTG: Pt will ambulate in controlled environ  assist needed:: (downgraded due to poor activity tolerance, generalized weakness/deconditioning, and impaired motor planning/sequencing) Contact Guard/Touching assist LTG: Ambulation distance in controlled environment: 9f with LRAD Goal: LTG Patient will ambulate in home environment (PT) Description: LTG: Patient will ambulate in home environment, # of feet with assistance (PT). Flowsheets (Taken 02/22/2022 0657) LTG: Pt will ambulate in home environ  assist needed:: (downgraded due to poor activity tolerance, generalized weakness/deconditioning, and impaired motor planning/sequencing) Contact Guard/Touching assist LTG: Ambulation distance in home environment: 229fwith LRAD

## 2022-02-22 NOTE — Progress Notes (Signed)
Occupational Therapy Session Note  Patient Details  Name: Linda [name] MRN: [id number] Date of Birth: 1933-10-27  Today's Date: 02/22/2022 OT Individual Time: 7846-9629 OT Individual Time Calculation (min): 42 min    Short Term Goals: Week 2:  OT Short Term Goal 1 (Week 2): STG = LTG 2/2 ELOS  Skilled Therapeutic Interventions/Progress Updates:  Skilled OT intervention completed with focus on ADL retraining, activity tolerance. Pt received upright in bed, hunched over asleep. Pt remained aphasic during session however did point and verbalize on occasion to assist with communication. Pt denied and did not present any signs of pain. Agreeable to change clothes with some encouragement.  Completed bed mobility with supervision, donned shoes with total A for time. CGA sit > stand and ambulatory transfer to w/c using RW. Seated at sink pt completed doffing of clothes and bathing at the sit > stand level with CGA only. Pt demonstrated self-initiation with washing all regions as well as brushing teeth. Set up A for donning shirt, min A for LB dressing with brief (pre-threaded).   Pt did demonstrate heightened anxiety via increased respirations, with O2 checked and pt WNLs. Therapist utilized calming/resting breaks and verbal encouragement to allow pt to feel more in control and less fearful of tasks. Also offered pt choices, and she did well with this technique. Pt initially agreeable to sit in recliner, with CGA ambulatory transfer, however once in chair, pt with emotional upset of therapist attempting to leave with her in the recliner. Allowed pt to move freely to allow her greater autonomy with pt positioning herself EOB and pointed to a gap in buttons on her shirt. Pt remained seated EOB with bed alarm activated for therapist to retrieve fastener. To avoid using pin for safety reasons/low cog, therapist utilized coban to tie/connect the missing button region. Pt then agreeable to transition into bed  with supervision. Pt remained upright in bed, hugged therapist in gratitude- saying "you helped me, thank you." Bed alarm activated and all needs in reach at end of session.  Therapy Documentation Precautions:  Precautions Precautions: Fall, Other (comment) Precaution Comments: HOH, aphaisa Restrictions Weight Bearing Restrictions: No    Therapy/Group: Individual Therapy  Blase Mess, MS, OTR/L  02/22/2022, 3:48 PM

## 2022-02-22 NOTE — Patient Care Conference (Signed)
Inpatient RehabilitationTeam Conference and Plan of Care Update Date: 02/22/2022   Time: 11:30 AM    Patient Name: Linda [name] [facility]      Medical Record Number: [id number]  Date of Birth: September 30, 1933 Sex: Female         Room/Bed: 4W24C/4W24C-01 Payor Info: Payor: MEDICARE / Plan: MEDICARE PART A AND B / Product Type: *No Product type* /    Admit Date/Time:  02/13/2022  5:44 PM  Primary Diagnosis:  Left middle cerebral artery stroke [facility])  Hospital Problems: Principal Problem:   Left middle cerebral artery stroke [facility])    Expected Discharge Date: Expected Discharge Date: 02/28/22  Team Members Present: Physician leading conference: Dr. Leeroy Cha Social Worker Present: Erlene Quan, [place] Nurse Present: Other (comment) Tacy Learn, RN) PT Present: Becky Sax, PT OT Present: Jennefer Bravo, OT SLP Present: Helaine Chess, SLP PPS Coordinator present : Gunnar Fusi, SLP     Current Status/Progress Goal Weekly Team Focus  Bowel/Bladder   Continent with occassional episodes of incontinence, LBM 02/21/22  Maintain and regain continence of bladder and bowel  Time toilet patient  q2-3 hrs and prn,   Swallow/Nutrition/ Hydration   Not addressing - pt on regular diet with thin liuids and adhering to general aspiration precautions with Mod I  N/A  N/A   ADL's   min A LB bathing/dressing, min A toileting; difficult sequencing and completing ADLs 2/2 lability  Mod I, supervision for higher level transfers/self-care will need to downgrade 2/2 cog  d/c planning, general strengthening, low level cog, emotional support   Mobility   bed mobility supervision, transfers with RW CGA/min A, gait 23f with RW min/mod A  Mod I, supervision gait  functional mobility/transfers, generalized strengthening and endurance, participation, dynamic standing balance/coordination, gait training, NMR, and D/C planning.   Communication   Min-Mod A for verbal expression, Min A for auditory comprehension  of single step commands  Min A for basic auditory comprehension, Min A for expression of wants/needs via multimodal communication, and Mod-Max A for verbal expression  verbal expression, multimodal communication, and auditory comprehension   Safety/Cognition/ Behavioral Observations  Not addressing due to extent of language deficits  N/A  N/A   Pain   Denies pain  remain painfree  Assess QS/PRN   Skin   Skin intactt, Ecchymosis areas resolving to bilateral arms,  Maintainskin integrity, no skin injuries while on IP Rehab  Assess skin QS/PRN     Discharge Planning:  Patient d/c to SNF on  9/12 or before   Team Discussion: Left MCA/CVA. Patient continent B/B. LBM 09/05. Denies pain. Melatonin for sleep. Incision is CDI. Patient tearful this morning. Will discuss starting depression medication with family. Eliquis resumed. Watch for bleeding. Patient not motivated and difficulty completing task with cueing. Poor safety awareness. Goals downgraded.  Patient on target to meet rehab goals: no, goals down graded d/t patient lability.  *See Care Plan and progress notes for long and short-term goals.   Revisions to Treatment Plan:  Medication adjustments   Teaching Needs: Medications, safety, transfer/gait training, etc  Current Barriers to Discharge: Decreased caregiver support, Weight, Medication compliance, and Behavior  Possible Resolutions to Barriers: Family education, medication education, placement     Medical Summary Current Status: overweight, bradycardia, HTN, atrial fibrillation  Barriers to Discharge: Medical stability  Barriers to Discharge Comments: overweight, bradycardia, HTN, atrial fibrillation Possible Resolutions to [employer]: provide dietary education, metoprolol discontinued, discontinue TED garments, restart Eliquis   Continued Need for Acute Rehabilitation  Level of Care: The patient requires daily medical management by a physician with specialized  training in physical medicine and rehabilitation for the following reasons: Direction of a multidisciplinary physical rehabilitation program to maximize functional independence : Yes Medical management of patient stability for increased activity during participation in an intensive rehabilitation regime.: Yes Analysis of laboratory values and/or radiology reports with any subsequent need for medication adjustment and/or medical intervention. : Yes   I attest that I was present, lead the team conference, and concur with the assessment and plan of the team.   Ernest Pine 02/22/2022, 2:16 PM

## 2022-02-22 NOTE — Discharge Summary (Signed)
Physician Discharge Summary  Patient ID: Linda [name] MRN: [id number] DOB/AGE: 01/11/1934 88 y.o.  Admit date: 02/13/2022 Discharge date: 02/27/2022  Discharge Diagnoses:  Principal Problem:   Left middle cerebral artery stroke [facility]) DVT prophylaxis Atrial fibrillation Mitral regurgitation Diastolic congestive heart failure Mood stabilization  Diabetes mellitus Hyperlipidemia Epistaxis GERD   Discharged Condition: Stable  Significant Diagnostic Studies: CT HEAD WO CONTRAST (5MM)  Result Date: 02/15/2022 CLINICAL DATA:  Stroke.  Follow-up. EXAM: CT HEAD WITHOUT CONTRAST TECHNIQUE: Contiguous axial images were obtained from the base of the skull through the vertex without intravenous contrast. RADIATION DOSE REDUCTION: This exam was performed according to the departmental dose-optimization program which includes automated exposure control, adjustment of the mA and/or kV according to patient size and/or use of iterative reconstruction technique. COMPARISON:  02/10/2022 and previous FINDINGS: Brain: No acute brainstem finding. Old small vessel infarction of the right cerebellum appears unchanged. 1 cm intraparenchymal hemorrhage within the right parietal white matter is unchanged. Chronic small-vessel ischemic changes otherwise affecting the right cerebral hemisphere. Subacute infarction of the left temporoparietal junction. Minimal petechial blood products appear questionably increased. No frank hematoma however. Slightly more regional swelling. One or 2 mm of left-to-right midline shift. Other small cortical and subcortical infarctions in the frontal and parietal region are barely visible by CT. No hydrocephalus. No extra-axial collection. Vascular: There is atherosclerotic calcification of the major vessels at the base of the brain. Skull: Negative Sinuses/Orbits: Chronic inflammatory changes of the right maxillary sinus. Orbits negative. Other: None IMPRESSION: No change in a 1 cm  intraparenchymal hemorrhage in the right parietal white matter. Subacute infarction at the left temporoparietal junction as seen previously. There may be minimal increase in petechial bleeding in the region, but there is no measurable hematoma. Mild mass effect with left-to-right shift of 1-2 mm. Electronically Signed   By: Nelson Chimes M.D.   On: 02/15/2022 12:26   DG CHEST PORT 1 VIEW  Result Date: 02/13/2022 CLINICAL DATA:  Altered mental status, coughing up blood EXAM: PORTABLE CHEST 1 VIEW COMPARISON:  Radiograph 02/10/2022 FINDINGS: Endotracheal tube and nasogastric tubes have been removed. There is a new left midlung opacity new from prior exam. No large pleural effusion. No pneumothorax. No acute osseous abnormality IMPRESSION: New left midlung opacity which could be developing pneumonia. Recommend continued radiographic follow-up. Electronically Signed   By: Maurine Simmering M.D.   On: 02/13/2022 11:24   DG CHEST PORT 1 VIEW  Result Date: 02/10/2022 CLINICAL DATA:  Intubated EXAM: PORTABLE CHEST 1 VIEW COMPARISON:  Chest radiograph 02/08/2022 FINDINGS: The endotracheal tube tip is approximately 4.4 cm from the carina. The enteric catheter tip is off the field of view but the side Stagliano projects in the stomach. The cardiomediastinal silhouette is stable with unchanged cardiomegaly. There is probable left basilar subsegmental atelectasis. There is no new or worsening focal airspace disease. There is unchanged asymmetric elevation of the right hemidiaphragm. There is no significant pleural effusion. There is no pneumothorax. IMPRESSION: 1. Endotracheal tube tip in the midthoracic trachea. New enteric catheter with the side Whisman in the stomach. 2. Unchanged aeration of the lungs since 02/08/2022 with no new or worsening focal airspace disease. Electronically Signed   By: Valetta Mole M.D.   On: 02/10/2022 08:00   IR PERCUTANEOUS ART THROMBECTOMY/INFUSION INTRACRANIAL INC DIAG ANGIO  Result Date:  02/10/2022 INDICATION: Acute onset of right sided weakness, global aphasia, and left-sided visual field deficit. Occluded M3 branch of the inferior division of the left middle  cerebral artery on CT angiogram of the head and neck. EXAM: 1. EMERGENT LARGE VESSEL OCCLUSION THROMBOLYSIS (anterior CIRCULATION) COMPARISON:  CT of the head and neck of February 08, 2022. MEDICATIONS: Ancef antibiotic was administered within 1 hour of the procedure. ANESTHESIA/SEDATION: General anesthesia. CONTRAST:  Omnipaque 300 approximately 120 mL. FLUOROSCOPY TIME:  Fluoroscopy Time: 64 minutes 45 seconds (1057 mGy). COMPLICATIONS: None immediate. TECHNIQUE: Following a full explanation of the procedure along with the potential associated complications, an informed witnessed consent was obtained. The risks of intracranial hemorrhage of 10%, worsening neurological deficit, ventilator dependency, death and inability to revascularize were all reviewed in detail with the patient's daughter. The patient was then put under general anesthesia by the Department of Anesthesiology at [facility]. The right groin was prepped and draped in the usual sterile fashion. Thereafter using modified Seldinger technique, transfemoral access into the right common femoral artery was obtained without difficulty. Over a 0.035 inch guidewire an 8 French 25 cm Pinnacle sheath was inserted. Through this, and also over a 0.035 inch guidewire a combination a 125 cm 6 French Simmons 2 inside of a 100 cm 088 aspiration catheter combination was advanced to the aortic arch region and selectively positioned in the left common carotid artery. Multiple attempts were made to advanced these two combinations over an 035 inch glidewire and distally in the left internal carotid artery with persistent herniation in this region. This strategy was abandoned. A 5 French Simmons 2 diagnostic catheter was then advanced into the distal left external carotid artery over an 035  inch glidewire. This in turn was exchanged over an 035 inch 300 cm Rosen exchange guidewire for a 95 cm balloon catheter which was positioned at the origin of the left internal carotid artery. The guidewire was removed. Using biplane roadmap with constant fluoroscopic guidance, in a coaxial manner the 071 132 Zoom aspiration was advanced to the supraclinoid left ICA with the 035 Callus guidewire being advanced to the inferior division M2 segment the left middle cerebral artery. The Zoom aspiration catheter was positioned just inside the origin of the left middle cerebral artery. The Callus wire was removed. Over a 0.014 inch Aristotle soft tip micro guidewire with a J-tip configuration, an 021 60 microcatheter was advanced into the inferior division of the left middle cerebral artery M2 segment and gently advanced through the occluded M3 segment followed by the microcatheter. The guidewire was removed. Good aspiration was obtained from the hub of the catheter. A gentle control arteriogram was then performed through the microcatheter demonstrated antegrade flow. This was then connected to continuous heparinized saline infusion. A 3 mm x 20 mm Solitaire X retrieval device was then advanced to the distal end of microcatheter. The O ring on the delivery microcatheter was loosened. With slight forward gentle traction with the right hand on delivery micro guidewire with the left hand the microcatheter was retrieved unsheathing the retrieval device. Aspiration catheter was advanced into the distal M1 segment. With proximal flow arrest in the left internal carotid artery aspiration pump at the hub of the Zoom aspiration catheter, and with a 20 mL syringe of the guide catheter, retrieval of combination of reversal of flow arrest, a control arteriogram performed through the balloon guide catheter demonstrated complete revascularization of the M3 segment of the inferior division left MCA. The left anterior cerebral artery  territory remained. The left MCA territory demonstrated a TICI 3 revascularization. Balloon guide catheter was retrieved and removed. An 8 French Angio-Seal closure device was  obtained in the right groin puncture site. Distal pulses remained palpable in both feet unchanged. A CT of the brain demonstrated no gross intracranial hemorrhage. Small area of hyperattenuation in the posterior left insular area was suggested of contrast stain versus petechial hemorrhage. Patient was left intubated because of her medical condition. She was then transferred to neuro ICU for post revascularization care. FINDINGS: A control arteriogram performed demonstrated the left external carotid artery and its major branches to be widely patent. The left internal carotid artery at the bulb to the cranial skull base demonstrated wide patency with significant tortuosity in the proximal 1/3. PROCEDURE: As above. IMPRESSION: Status post endovascular revascularization of the M3 branch of the inferior division of the left middle cerebral artery with a 3 mm x 20 mm Solitaire X retrieval device with aspiration achieving a TICI 3 revascularization. Puncture to first pass prolonged due to challenging aortic arch anatomy and prox Lt ICA tortuosity. PLAN: Follow-up as per referring MD. Electronically Signed   By: Luanne Bras M.D.   On: 02/10/2022 07:44   IR CT Head Ltd  Result Date: 02/10/2022 INDICATION: Acute onset of right sided weakness, global aphasia, and left-sided visual field deficit. Occluded M3 branch of the inferior division of the left middle cerebral artery on CT angiogram of the head and neck. EXAM: 1. EMERGENT LARGE VESSEL OCCLUSION THROMBOLYSIS (anterior CIRCULATION) COMPARISON:  CT of the head and neck of February 08, 2022. MEDICATIONS: Ancef antibiotic was administered within 1 hour of the procedure. ANESTHESIA/SEDATION: General anesthesia. CONTRAST:  Omnipaque 300 approximately 120 mL. FLUOROSCOPY TIME:  Fluoroscopy Time:  64 minutes 45 seconds (1057 mGy). COMPLICATIONS: None immediate. TECHNIQUE: Following a full explanation of the procedure along with the potential associated complications, an informed witnessed consent was obtained. The risks of intracranial hemorrhage of 10%, worsening neurological deficit, ventilator dependency, death and inability to revascularize were all reviewed in detail with the patient's daughter. The patient was then put under general anesthesia by the Department of Anesthesiology at [facility]. The right groin was prepped and draped in the usual sterile fashion. Thereafter using modified Seldinger technique, transfemoral access into the right common femoral artery was obtained without difficulty. Over a 0.035 inch guidewire an 8 French 25 cm Pinnacle sheath was inserted. Through this, and also over a 0.035 inch guidewire a combination a 125 cm 6 French Simmons 2 inside of a 100 cm 088 aspiration catheter combination was advanced to the aortic arch region and selectively positioned in the left common carotid artery. Multiple attempts were made to advanced these two combinations over an 035 inch glidewire and distally in the left internal carotid artery with persistent herniation in this region. This strategy was abandoned. A 5 French Simmons 2 diagnostic catheter was then advanced into the distal left external carotid artery over an 035 inch glidewire. This in turn was exchanged over an 035 inch 300 cm Rosen exchange guidewire for a 95 cm balloon catheter which was positioned at the origin of the left internal carotid artery. The guidewire was removed. Using biplane roadmap with constant fluoroscopic guidance, in a coaxial manner the 071 132 Zoom aspiration was advanced to the supraclinoid left ICA with the 035 Callus guidewire being advanced to the inferior division M2 segment the left middle cerebral artery. The Zoom aspiration catheter was positioned just inside the origin of the left middle  cerebral artery. The Callus wire was removed. Over a 0.014 inch Aristotle soft tip micro guidewire with a J-tip configuration, an 021  60 microcatheter was advanced into the inferior division of the left middle cerebral artery M2 segment and gently advanced through the occluded M3 segment followed by the microcatheter. The guidewire was removed. Good aspiration was obtained from the hub of the catheter. A gentle control arteriogram was then performed through the microcatheter demonstrated antegrade flow. This was then connected to continuous heparinized saline infusion. A 3 mm x 20 mm Solitaire X retrieval device was then advanced to the distal end of microcatheter. The O ring on the delivery microcatheter was loosened. With slight forward gentle traction with the right hand on delivery micro guidewire with the left hand the microcatheter was retrieved unsheathing the retrieval device. Aspiration catheter was advanced into the distal M1 segment. With proximal flow arrest in the left internal carotid artery aspiration pump at the hub of the Zoom aspiration catheter, and with a 20 mL syringe of the guide catheter, retrieval of combination of reversal of flow arrest, a control arteriogram performed through the balloon guide catheter demonstrated complete revascularization of the M3 segment of the inferior division left MCA. The left anterior cerebral artery territory remained. The left MCA territory demonstrated a TICI 3 revascularization. Balloon guide catheter was retrieved and removed. An 8 French Angio-Seal closure device was obtained in the right groin puncture site. Distal pulses remained palpable in both feet unchanged. A CT of the brain demonstrated no gross intracranial hemorrhage. Small area of hyperattenuation in the posterior left insular area was suggested of contrast stain versus petechial hemorrhage. Patient was left intubated because of her medical condition. She was then transferred to neuro ICU for  post revascularization care. FINDINGS: A control arteriogram performed demonstrated the left external carotid artery and its major branches to be widely patent. The left internal carotid artery at the bulb to the cranial skull base demonstrated wide patency with significant tortuosity in the proximal 1/3. PROCEDURE: As above. IMPRESSION: Status post endovascular revascularization of the M3 branch of the inferior division of the left middle cerebral artery with a 3 mm x 20 mm Solitaire X retrieval device with aspiration achieving a TICI 3 revascularization. Puncture to first pass prolonged due to challenging aortic arch anatomy and prox Lt ICA tortuosity. PLAN: Follow-up as per referring MD. Electronically Signed   By: Luanne Bras M.D.   On: 02/10/2022 07:44   CT HEAD WO CONTRAST (5MM)  Result Date: 02/10/2022 CLINICAL DATA:  88 year old female code stroke presentation 02/08/2022 with left MCA branch occlusion treated TNK and with endovascular revascularization. Subsequent encounter. EXAM: CT HEAD WITHOUT CONTRAST TECHNIQUE: Contiguous axial images were obtained from the base of the skull through the vertex without intravenous contrast. RADIATION DOSE REDUCTION: This exam was performed according to the departmental dose-optimization program which includes automated exposure control, adjustment of the mA and/or kV according to patient size and/or use of iterative reconstruction technique. COMPARISON:  Presentation head CT 02/08/2022. brain MRI yesterday. FINDINGS: Brain: Posterior left MCA territory cytotoxic edema (series 7, image 19) corresponding to abnormal diffusion on the MRI yesterday. Mild petechial hemorrhage there appears not significantly changed. No malignant hemorrhagic transformation. Scattered small areas of restricted diffusion in the left hemisphere, some visible by CT (mild cortical edema left superior frontal gyrus series 7, image 10). Trace rightward midline shift, with only mild mass  effect on the left lateral ventricle. Basilar cisterns remain patent. Small posterior right centrum semi of bowel intra-axial hemorrhage is 8 x 10 mm (series 5, image 39) and was present on MRI yesterday. No significant regional  edema or mass effect. Estimated hemorrhage volume up to 1 mL. No other cytotoxic edema identified. Stable gray-white matter differentiation elsewhere. Small chronic cerebellar infarcts. Vascular: Calcified atherosclerosis at the skull base. Skull: No acute osseous abnormality identified. Sinuses/Orbits: Intubated. Fluid in the visible pharynx. Right paranasal sinus opacification has not significantly changed since presentation. Tympanic cavities and mastoids remain clear. Other: No acute orbit or scalp soft tissue finding. IMPRESSION: 1. Small acute intra-axial hemorrhage in the posterior Right Centrum Semiovale is stable since yesterday, estimated volume up to 1 mL. No regional edema or mass effect. 2. Expected CT appearance of Left MCA territory infarcts since MRI yesterday. Mild petechial hemorrhage but no malignant hemorrhagic transformation. 3. Minimal intracranial mass effect, trace rightward midline shift. 4. No new intracranial abnormality. Electronically Signed   By: Genevie Ann M.D.   On: 02/10/2022 05:39   MR BRAIN WO CONTRAST  Result Date: 02/09/2022 CLINICAL DATA:  Stroke, follow up get with MRI brain post-TNK timed 24 hours at APPROX.6 PM on 8/24; Stroke, follow up 24 hour post TNK, obtain at about 6 pm on 8/24 please EXAM: MRI HEAD WITHOUT CONTRAST MRA HEAD WITHOUT CONTRAST TECHNIQUE: Multiplanar, multiecho pulse sequences of the brain and surrounding structures were obtained without intravenous contrast. Angiographic images of the Circle of Willis were obtained using MRA technique without intravenous contrast. COMPARISON:  CTA 02/08/2022.  MRI May 10, 2018. FINDINGS: MRI HEAD FINDINGS Brain: Confluent acute infarct involving the left parietal and left temporal lobe as  well as a small portion of the posterior left insula. Multiple additional small acute infarcts in the posterior left frontal lobe and more superior left parietal lobe. Associated edema with regional mass effect. No midline shift. Additional scattered T2/FLAIR hyperintensities in the white matter, nonspecific but compatible with chronic microvascular ischemic disease. Approximately 8 mm focus of susceptibility artifact and T2 hypointensity in the right precentral gyrus. This finding was not present on the prior 2019 MRI. No hydrocephalus. Remote right cerebellar infarct. Vascular: See below. Skull and upper cervical spine: Normal marrow signal. Sinuses/Orbits: Redemonstrated mucosal edema in the right paranasal sinuses involving the right ethmoid and maxillary sinus with air-fluid level right maxillary sinus. There is diffuse soft tissue thickening in the right nasal cavity. Other: No mastoid effusions. MRA HEAD FINDINGS Anterior circulation: Bilateral intracranial ICAs, MCAs, and ACAs are patent without proximal hemodynamically significant stenosis. The previously occluded left M3 MCA branch is probably now patent. Posterior circulation: Bilateral intradural vertebral arteries, basilar artery and bilateral posterior cerebral arteries are patent without proximal hemodynamically significant stenosis. IMPRESSION: MRI: 1. Approximately 8 mm focus of susceptibility artifact and T2 hypointensity in the right precentral gyrus, possibly representing an age-indeterminate hemorrhage that was not present in 2019. Recommend CT of the head without contrast to evaluate for hyperdense hemorrhage in this region and exclude interval/acute hemorrhage. 2. Acute posterior left MCA territory infarct, as detailed above 3. Redemonstrated mucosal edema in the right paranasal sinuses involving the right ethmoid and maxillary sinus with air-fluid level right maxillary sinus. There is diffuse soft tissue thickening in the right nasal cavity  which could be due to polyp or tumor. Recommend direct visualization. MRA: No evidence of a proximal large vessel occlusion. The previously occluded left M3 MCA branch is probably now patent, although evaluation is somewhat limited with MRA. These results will be called to the ordering clinician or representative by the Radiologist Assistant, and communication documented in the PACS or [employer]. Electronically Signed   By: Jamesetta So.D.  On: 02/09/2022 15:43   MR ANGIO HEAD WO CONTRAST  Result Date: 02/09/2022 CLINICAL DATA:  Stroke, follow up get with MRI brain post-TNK timed 24 hours at APPROX.6 PM on 8/24; Stroke, follow up 24 hour post TNK, obtain at about 6 pm on 8/24 please EXAM: MRI HEAD WITHOUT CONTRAST MRA HEAD WITHOUT CONTRAST TECHNIQUE: Multiplanar, multiecho pulse sequences of the brain and surrounding structures were obtained without intravenous contrast. Angiographic images of the Circle of Willis were obtained using MRA technique without intravenous contrast. COMPARISON:  CTA 02/08/2022.  MRI May 10, 2018. FINDINGS: MRI HEAD FINDINGS Brain: Confluent acute infarct involving the left parietal and left temporal lobe as well as a small portion of the posterior left insula. Multiple additional small acute infarcts in the posterior left frontal lobe and more superior left parietal lobe. Associated edema with regional mass effect. No midline shift. Additional scattered T2/FLAIR hyperintensities in the white matter, nonspecific but compatible with chronic microvascular ischemic disease. Approximately 8 mm focus of susceptibility artifact and T2 hypointensity in the right precentral gyrus. This finding was not present on the prior 2019 MRI. No hydrocephalus. Remote right cerebellar infarct. Vascular: See below. Skull and upper cervical spine: Normal marrow signal. Sinuses/Orbits: Redemonstrated mucosal edema in the right paranasal sinuses involving the right ethmoid and maxillary  sinus with air-fluid level right maxillary sinus. There is diffuse soft tissue thickening in the right nasal cavity. Other: No mastoid effusions. MRA HEAD FINDINGS Anterior circulation: Bilateral intracranial ICAs, MCAs, and ACAs are patent without proximal hemodynamically significant stenosis. The previously occluded left M3 MCA branch is probably now patent. Posterior circulation: Bilateral intradural vertebral arteries, basilar artery and bilateral posterior cerebral arteries are patent without proximal hemodynamically significant stenosis. IMPRESSION: MRI: 1. Approximately 8 mm focus of susceptibility artifact and T2 hypointensity in the right precentral gyrus, possibly representing an age-indeterminate hemorrhage that was not present in 2019. Recommend CT of the head without contrast to evaluate for hyperdense hemorrhage in this region and exclude interval/acute hemorrhage. 2. Acute posterior left MCA territory infarct, as detailed above 3. Redemonstrated mucosal edema in the right paranasal sinuses involving the right ethmoid and maxillary sinus with air-fluid level right maxillary sinus. There is diffuse soft tissue thickening in the right nasal cavity which could be due to polyp or tumor. Recommend direct visualization. MRA: No evidence of a proximal large vessel occlusion. The previously occluded left M3 MCA branch is probably now patent, although evaluation is somewhat limited with MRA. These results will be called to the ordering clinician or representative by the Radiologist Assistant, and communication documented in the PACS or [employer]. Electronically Signed   By: Margaretha Sheffield M.D.   On: 02/09/2022 15:43   ECHOCARDIOGRAM COMPLETE  Result Date: 02/09/2022    ECHOCARDIOGRAM REPORT   Patient Name:   FELICITE ZEIMET Date of Exam: 02/09/2022 Medical Rec #:  [id number]     Height:       63.0 in Accession #:    [id number]    Weight:       172.0 lb Date of Birth:  02-02-1934     BSA:          1.813  m Patient Age:    35 years      BP:           108/63 mmHg Patient Gender: F             HR:           70 bpm. Exam  Location:  Inpatient Procedure: 2D Echo, Cardiac Doppler and Color Doppler Indications:    Stroke  History:        Patient has prior history of Echocardiogram examinations, most                 recent 09/29/2020. CHF, Stroke and TIA, Arrythmias:PVC; Risk                 Factors:Hypertension and Dyslipidemia.                  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  Sonographer:    Memory Argue Referring Phys: [id number] Tarnov XU IMPRESSIONS  1. Left ventricular ejection fraction, by estimation, is 50 to 55%. The left ventricle has low normal function. The left ventricle has no regional wall motion abnormalities. The left ventricular internal cavity size was severely dilated. There is severe  focal thickening of the basal septal segment measuring 1.7cm. The rest of the LV segments demonstrate mild left ventricular hypertrophy. Left ventricular diastolic parameters are indeterminate.  2. Right ventricular systolic function is normal. The right ventricular size is mildly enlarged. PASP is 70mHg + RAP.  3. Left atrial size was severely dilated.  4. Right atrial size was severely dilated.  5. The mitral valve has been repaired/replaced. There is a Mitra-Clip present in the mitral position. The mean gradient is 775mg at HR 87bpm. There is moderate mitral regurgitation.  6. Tricuspid valve regurgitation is moderate.  7. The aortic valve is tricuspid. Aortic valve regurgitation is not visualized. Aortic valve sclerosis is present, with no evidence of aortic valve stenosis. Comparison(s): No prior TTE in our system. Compared to prior TTE from OSH, the EF appears to be 50-55% (previously 45-50%), the MR appears slightly improved to moderate (previously severe), TR appears better at moderate (previously severe), PASP remains elevated although RAP not obtained on current  study as patient became combative. FINDINGS  Left Ventricle: Left ventricular ejection fraction, by estimation, is 50 to 55%. The left ventricle has low normal function. The left ventricle has no regional wall motion abnormalities. The left ventricular internal cavity size was severely dilated. There is severe focal thickening of the basal septal segment measuring 1.7cm. The rest of the LV segments demonstrate mild left ventricular hypertrophy. Left ventricular diastolic parameters are indeterminate. Right Ventricle: The right ventricular size is mildly enlarged. No increase in right ventricular wall thickness. Right ventricular systolic function is normal. Left Atrium: Left atrial size was severely dilated. Right Atrium: Right atrial size was severely dilated. Pericardium: There is no evidence of pericardial effusion. Mitral Valve: 87bpm. The mitral valve has been repaired/replaced. Moderate mitral valve regurgitation. There is a Mitra-Clip present in the mitral position. MV peak gradient, 14.6 mmHg. The mean mitral valve gradient is 7.0 mmHg. Tricuspid Valve: The tricuspid valve is normal in structure. Tricuspid valve regurgitation is moderate. Aortic Valve: The aortic valve is tricuspid. Aortic valve regurgitation is not visualized. Aortic valve sclerosis is present, with no evidence of aortic valve stenosis. Aortic valve mean gradient measures 4.0 mmHg. Aortic valve peak gradient measures 8.1  mmHg. Aortic valve area, by VTI measures 2.23 cm. Pulmonic Valve: The pulmonic valve was normal in structure. Pulmonic valve regurgitation is trivial. Aorta: The aortic root is normal in size and structure. IAS/Shunts: There is right bowing of the interatrial septum, suggestive of elevated left atrial pressure. The  atrial septum is grossly normal.  LEFT VENTRICLE PLAX 2D LVIDd:         6.40 cm LVIDs:         4.10 cm LV PW:         1.10 cm LV IVS:        1.00 cm LVOT diam:     2.10 cm LV SV:         50 LV SV Index:   27  LVOT Area:     3.46 cm  LV Volumes (MOD) LV vol d, MOD A4C: 137.0 ml LV vol s, MOD A4C: 67.8 ml LV SV MOD A4C:     137.0 ml RIGHT VENTRICLE TAPSE (M-mode): 1.9 cm LEFT ATRIUM         Index LA diam:    5.20 cm 2.87 cm/m  AORTIC VALVE AV Area (Vmax):    2.31 cm AV Area (Vmean):   2.23 cm AV Area (VTI):     2.23 cm AV Vmax:           142.00 cm/s AV Vmean:          93.400 cm/s AV VTI:            0.222 m AV Peak Grad:      8.1 mmHg AV Mean Grad:      4.0 mmHg LVOT Vmax:         94.70 cm/s LVOT Vmean:        60.200 cm/s LVOT VTI:          0.143 m LVOT/AV VTI ratio: 0.64  AORTA Ao Root diam: 3.10 cm MITRAL VALVE                 TRICUSPID VALVE MV Area VTI:  1.26 cm       TR Peak grad:   46.0 mmHg MV Peak grad: 14.6 mmHg      TR Vmax:        339.00 cm/s MV Mean grad: 7.0 mmHg MV Vmax:      1.91 m/s       SHUNTS MV Vmean:     135.0 cm/s     Systemic VTI:  0.14 m MR Peak grad:   121.4 mmHg   Systemic Diam: 2.10 cm MR Mean grad:   74.0 mmHg MR Vmax:        551.00 cm/s MR Vmean:       396.0 cm/s MR PISA:        0.57 cm MR PISA Radius: 0.30 cm Gwyndolyn Kaufman MD Electronically signed by Gwyndolyn Kaufman MD Signature Date/Time: 02/09/2022/1:20:58 PM    Final    DG Abd Portable 1V  Result Date: 02/09/2022 CLINICAL DATA:  Orogastric tube placement EXAM: PORTABLE ABDOMEN - 1 VIEW COMPARISON:  None Available. FINDINGS: Orogastric tube tip overlies the expected mid to distal body of the stomach. Normal abdominal gas pattern. Cholecystectomy clips seen in the right upper quadrant. Contrast is seen within the renal collecting system bilaterally. No hydronephrosis. IMPRESSION: Orogastric tube tip within the stomach. Electronically Signed   By: Fidela Salisbury M.D.   On: 02/09/2022 01:15   Portable Chest x-ray  Result Date: 02/08/2022 CLINICAL DATA:  ET tube evaluation EXAM: PORTABLE CHEST 1 VIEW COMPARISON:  02/05/2022 FINDINGS: Endotracheal tube tip is 11 mm above the carina. Cardiomegaly. No confluent airspace opacities  or effusions. Mild vascular congestion. Elevation of the right hemidiaphragm, stable. IMPRESSION: Endotracheal tube 11 mm above the carina. Cardiomegaly, vascular congestion. Electronically Signed  By: Rolm Baptise M.D.   On: 02/08/2022 23:23   CT ANGIO HEAD NECK W WO CM W PERF (CODE STROKE)  Result Date: 02/08/2022 CLINICAL DATA:  Acute neuro deficit. Right-sided droop. Abnormal speech EXAM: CT ANGIOGRAPHY HEAD AND NECK CT PERFUSION BRAIN TECHNIQUE: Multidetector CT imaging of the head and neck was performed using the standard protocol during bolus administration of intravenous contrast. Multiplanar CT image reconstructions and MIPs were obtained to evaluate the vascular anatomy. Carotid stenosis measurements (when applicable) are obtained utilizing NASCET criteria, using the distal internal carotid diameter as the denominator. Multiphase CT imaging of the brain was performed following IV bolus contrast injection. Subsequent parametric perfusion maps were calculated using RAPID software. RADIATION DOSE REDUCTION: This exam was performed according to the departmental dose-optimization program which includes automated exposure control, adjustment of the mA and/or kV according to patient size and/or use of iterative reconstruction technique. CONTRAST:  175m OMNIPAQUE IOHEXOL 350 MG/ML SOLN COMPARISON:  CT head 02/08/2022 FINDINGS: CTA NECK FINDINGS Aortic arch: Mild atherosclerotic calcification aortic arch. Bovine branching pattern. Proximal great vessels widely patent. Right carotid system: Right carotid widely patent. Minimal atherosclerotic disease right carotid bifurcation. Left carotid system: Left carotid widely patent. Negative for stenosis. Vertebral arteries: Both vertebral arteries patent to the skull base without stenosis. Skeleton: No acute skeletal abnormality. Other neck: Negative for mass or adenopathy. Mucosal edema and air-fluid level right maxillary sinus. Mucosal edema right ethmoid sinus.  Soft tissue thickening throughout the right nasal cavity which could be due to polyp or tumor. Direct visualization recommended. Upper chest: Lung apices clear bilaterally. Review of the MIP images confirms the above findings CTA HEAD FINDINGS Anterior circulation: Internal carotid artery widely patent through the skull base and cavernous segment without stenosis. Left M1 widely patent. Occlusion of a left M3 branch involving the inferior division of the left MCA. Superior division left MCA patent Anterior cerebral arteries patent bilaterally. Right middle cerebral artery widely patent without stenosis. Posterior circulation: Both vertebral arteries patent to the basilar. PICA patent bilaterally. Basilar widely patent. Superior cerebellar and posterior cerebral arteries patent bilaterally without stenosis or large vessel occlusion. Venous sinuses: Normal venous enhancement Anatomic variants: None Review of the MIP images confirms the above findings CT Brain Perfusion Findings: ASPECTS: 10 CBF (<30%) Volume: 881m  This is in the left parietal lobe. Perfusion (Tmax>6.0s) volume: 14286mMost of the area of delayed perfusion is in the left posterior temporal and parietal lobe however there is a significant amount also in the right hemisphere involving the right frontal lobe and right parietal lobe. This may be artifact on the right. Mismatch Volume: 134m35mowever approximately 80mL51mthis is in the left hemisphere. Infarction Location:Left parietal lobe IMPRESSION: 1. Occlusion left M3 branch supplying the left parietal lobe. 2. CT perfusion reveals 8 mL of core infarct in the left parietal lobe with surrounding penumbra. Estimated 80 mL of penumbra in the left hemisphere. CT perfusion also shows delayed perfusion in the right frontal and parietal lobe which could be due to artifact as there is no associated stenosis to this area. 3. No significant carotid or vertebral artery stenosis in the neck 4. These results were  called by telephone at the time of interpretation on 02/08/2022 at 7:00 pm to provider [facility] verbally acknowledged these results. 5. Asymmetric mucosal edema in the right paranasal sinuses involving the right ethmoid and maxillary sinus with air-fluid level right maxillary sinus. There is diffuse soft tissue thickening in the right  nasal cavity which could be due to polyp or tumor. Recommend direct visualization. Electronically Signed   By: Franchot Gallo M.D.   On: 02/08/2022 19:09   CT HEAD CODE STROKE WO CONTRAST`  Result Date: 02/08/2022 CLINICAL DATA:  Code stroke. Acute neuro deficit. Right facial droop and arm weakness. EXAM: CT HEAD WITHOUT CONTRAST TECHNIQUE: Contiguous axial images were obtained from the base of the skull through the vertex without intravenous contrast. RADIATION DOSE REDUCTION: This exam was performed according to the departmental dose-optimization program which includes automated exposure control, adjustment of the mA and/or kV according to patient size and/or use of iterative reconstruction technique. COMPARISON:  CT head 05/10/2018 FINDINGS: Brain: Mild atrophy and mild white matter hypodensity. Negative for acute infarct, hemorrhage, mass. Vascular: Negative for hyperdense vessel Skull: Negative Sinuses/Orbits: Mucosal edema in the paranasal sinuses. Air-fluid level right maxillary and right sphenoid sinus. Bilateral cataract extraction Other: None ASPECTS ([place] Stroke Program Early CT Score) - Ganglionic level infarction (caudate, lentiform nuclei, internal capsule, insula, M1-M3 cortex): 7 - Supraganglionic infarction (M4-M6 cortex): 3 Total score (0-10 with 10 being normal): 10 IMPRESSION: 1. Atrophy and chronic microvascular ischemic change in the white matter. No acute abnormality. 2. ASPECTS is 10 3. Code stroke imaging results were communicated on 02/08/2022 at 6:36 pm to provider Lindzen via amion app Electronically Signed   By: Franchot Gallo M.D.   On:  02/08/2022 18:38   DG Chest Port 1 View  Result Date: 02/05/2022 CLINICAL DATA:  Shortness of breath EXAM: PORTABLE CHEST 1 VIEW COMPARISON:  07/13/2021 FINDINGS: Cardiomegaly, vascular congestion. Elevation of the right hemidiaphragm, stable. Patchy right mid lung and lower lobe airspace opacity. No focal opacity on the left. No effusions or acute bony abnormality. IMPRESSION: Patchy right mid and lower lung opacities could reflect atelectasis or pneumonia. Cardiomegaly, vascular congestion. Electronically Signed   By: Rolm Baptise M.D.   On: 02/05/2022 01:29    Labs:  Basic Metabolic Panel: Recent Labs  Lab 02/20/22 1505  NA 137  K 3.4*  CL 100  CO2 26  GLUCOSE 155*  BUN 11  CREATININE 0.81  CALCIUM 9.2    CBC: Recent Labs  Lab 02/20/22 1505  WBC 6.8  HGB 15.6*  HCT 46.3*  MCV 96.7  PLT 210    CBG: Recent Labs  Lab 02/21/22 0524 02/22/22 0557 02/23/22 0502 02/24/22 0456  GLUCAP 119* 139* 127* 118*   Family history.  Brother with CAD mother with CVA maternal grandmother with breast cancer.  Denies any colon cancer esophageal cancer or rectal cancer  Brief HPI:   Linda [name] is a 88 y.o. right-handed female with history of hearing loss, atrial fibrillation maintained on Eliquis, left ventricular hypertrophy, severe mitral regurgitation status post MitraClip 08/4740, diastolic congestive heart failure hypertension hyperlipidemia and diabetes mellitus.  Per chart review lives in independent living facility.  She did use a rollator for long distances.  Presented 02/08/2022 to [facility] with acute onset of right facial droop right arm weakness global aphasia and field cut.  It was noted patient's Eliquis recently discontinued due to nosebleed.  Epistaxis packed by ENT Dr. Richardson Landry at [facility].  Cranial CT scan no acute abnormality.  She did receive TNK.  CT angiogram head and neck occlusion left M3 branch supplying the left parietal lobe.  Patient was transferred to [facility].   CT perfusion revealed 8 mL of core infarct in left parietal lobe with surrounding penumbra.  MRA approximately 8 mm focus of  susceptibility artifact and T2 hypointensity in the right precentral gyrus possibly representing age-indeterminate hemorrhage that was not present in 2019.  Acute posterior left MCA territory infarction.  Underwent left MCA M3 occlusion with revascularization 02/12/2022 per interventional radiology.  MRI follow-up noting acute posterior left MCA territory infarct as well as right frontal hematoma likely hemorrhagic transformation from TNK.  Echocardiogram ejection fraction 50 to 55% no wall motion abnormality.  She was cleared to begin low-dose aspirin therapy.  Her chronic Eliquis remains on hold.  Palliative care consulted to establish goals of care.  Therapy evaluations completed due to patient's right-sided weakness decreased functional mobility was admitted for a comprehensive rehab program.   Hospital Course: SANTANA GOSDIN was admitted to rehab 02/13/2022 for inpatient therapies to consist of PT, ST and OT at least three hours five days a week. Past admission physiatrist, therapy team and rehab RN have worked together to provide customized collaborative inpatient rehab.  Pertaining to patient's left MCA infarction/M3 occlusion status post TNK with revascularization.  Latest cranial CT scan showed no change in 1 cm intraparenchymal hemorrhage in the right parietal white matter.  Subacute infarct left temporoparietal junction as seen previously.  There may be a minimal increase in petechial bleeding in the region but there are no measurable hematoma.  Mild mass effect unchanged left to right shift 1 to 2 mm.  Patient initially on aspirin for CVA prophylaxis she had been on chronic Eliquis prior to admission for atrial fibrillation that she was able to resume 02/21/2022.  She did receive followed by cardiology services for bradycardia and initially beta-blocker discontinued resume Toprol-XL  12.5 mg daily at low-dose and maintained on Cozaar her bradycardia initially felt to be due to nebulizer treatment that was discontinued.  Diastolic congestive heart failure exhibiting no signs of fluid overload remain on low-dose diuretic.  Blood sugars overall controlled hemoglobin A1c 6.6 and she remained on Glucophage.  Zetia ongoing for hyperlipidemia.  During her hospital course noted early on bouts of epistaxis packed by Dr. Richardson Landry ENT while at [facility] no further bouts of epistaxis noted she did have some flecks of blood in her sputum hemoglobin hematocrit remained stable.  Mood stabilization with the use of Prozac.   Blood pressures were monitored on TID basis and soft and monitored  Diabetes has been monitored with ac/hs CBG checks and SSI was use prn for tighter BS control.    Rehab course: During patient's stay in rehab weekly team conferences were held to monitor patient's progress, set goals and discuss barriers to discharge. At admission, patient required moderate assist stand pivot transfers minimal assist 3 feet rolling walker  Physical exam.  Blood pressure 155/68 pulse 55 temperature 97.8 respirations 22 oxygen saturation 93% room air Constitutional.  No acute distress Neurologic.  Alert very hard of hearing she was globally aphasic but answers some simple questions. HEENT Head.  Normocephalic and atraumatic Eyes.  Pupils round and reactive to light no discharge without nystagmus Neck.  Supple nontender no JVD without thyromegaly Cardiac regular rate and rhythm without any extra sounds or murmur heard Abdomen.  Soft nontender positive bowel sounds without rebound Respiratory effort normal no respiratory distress without wheeze Musculoskeletal.  Motor strength 5/5 in left 4+/5 in right deltoid bicep tricep grip hip flexors knee extensors ankle dorsi plantarflexion sensory exam withdraws to pinch  He/She  has had improvement in activity tolerance, balance, postural control as well  as ability to compensate for deficits. He/She has had improvement in functional  use RUE/LUE  and RLE/LLE as well as improvement in awareness.  Sessions focused on functional mobility transfers generalized strengthening and endurance.  Patient transferred Long sitting edge of bed head of bed elevated with supervision and donned shoes with max assist.  Stood from edge of bed rolling walker contact-guard.  She was able to ambulate to the sink with rolling walker.  Patient able to stand for minutes at the sink brushing teeth with contact-guard.  Sit to stand rolling walker contact-guard ambulating up to 55 feet with therapies contact-guard.  Transferred to and from regular toilet rolling walker contact-guard and able to manage clothing with contact-guard.  Speech therapy follow-up for aphasia and verbally produced CVC and CV word structures on 50% of trial given max assist cues.  Counted to 3 with minimal assist verbal cues.  Full teaching completed plan discharge to skilled nursing facility 9/11       Disposition: Discharge to skilled nursing facility    Diet: Carb modified  Special Instructions: No driving smoking or alcohol  Medications at discharge 1.  Tylenol as needed 2.  Eliquis 5 mg p.o. twice daily 3.  B complex with vitamin C daily 4.  Vitamin D 1000 units p.o. daily 5.  Colace 100 mg p.o. twice daily 6.  Zetia 10 mg p.o. daily 7.  Prozac 10 mg p.o. daily 8.  Lasix 20 mg p.o. daily 9.  Claritin 10 mg p.o. daily 10.  Cozaar 25 mg p.o. daily 11.  Magnesium gluconate 250 mg p.o. nightly 12.  Melatonin 3 mg p.o. nightly 13.  Glucophage 500 mg p.o.  twice daily 14.  Protonix 40 mg p.o. daily 15.  Ocean nasal spray 1 spray each nostril daily as needed 16.  Klor-Con 20 mEq p.o. daily 17.  Toprol-XL 12.5 mg nightly   30-35 minutes were spent completing discharge summary and discharge planning  Discharge Instructions     Ambulatory referral to Neurology   Complete by: As directed     An appointment is requested in approximately: 4 weeks left MCA infarction with hemorrhagic transformation   Ambulatory referral to Physical Medicine Rehab   Complete by: As directed    Follow up one month left MAC infarction   patient is for SNF placement        Follow-up Information     Raulkar, Clide Deutscher, MD Follow up.   Specialty: Physical Medicine and Rehabilitation Why: Office to call for appointment Contact information: [address] [place] [address] [phone]         Clyde Canterbury, MD Follow up.   Specialty: Otolaryngology Why: Call for appointment as needed Contact information: [address] [place] [address] [phone]         Andrez Grime, MD Follow up.   Specialty: Cardiology Why: Call for appointment Contact information: [place] [address] [phone]                 Signed: Cathlyn Parsons 02/27/2022, 5:41 AM

## 2022-02-23 LAB — GLUCOSE, CAPILLARY: Glucose-Capillary: 127 mg/dL — ABNORMAL HIGH (ref 70–99)

## 2022-02-23 MED ORDER — APIXABAN 5 MG PO TABS
5.0000 mg | ORAL_TABLET | Freq: Two times a day (BID) | ORAL | Status: DC
  Administered 2022-02-23 – 2022-02-27 (×8): 5 mg via ORAL
  Filled 2022-02-23 (×8): qty 1

## 2022-02-23 MED ORDER — METOPROLOL SUCCINATE ER 25 MG PO TB24
12.5000 mg | ORAL_TABLET | Freq: Every day | ORAL | Status: DC
  Administered 2022-02-24 – 2022-02-25 (×2): 12.5 mg via ORAL
  Filled 2022-02-23 (×3): qty 1

## 2022-02-23 MED ORDER — FLUOXETINE HCL 20 MG PO CAPS
20.0000 mg | ORAL_CAPSULE | Freq: Every day | ORAL | Status: DC
  Administered 2022-02-24 – 2022-02-26 (×3): 20 mg via ORAL
  Filled 2022-02-23 (×3): qty 1

## 2022-02-23 NOTE — Progress Notes (Signed)
Occupational Therapy Session Note  Patient Details  Name: Linda [name] MRN: [id number] Date of Birth: 1934-02-03  Today's Date: 02/23/2022 OT Individual Time: 9292-4462 OT Individual Time Calculation (min): 43 min    Short Term Goals: Week 2:  OT Short Term Goal 1 (Week 2): STG = LTG 2/2 ELOS  Skilled Therapeutic Interventions/Progress Updates:  Skilled OT intervention completed with focus on self-care and toileting needs, functional endurance. Pt received upright in bed, hunched over, asleep. Easily woken. Pt remained aphasic, however did not indicate/demonstrate pain. Pt continued to have increased respirations with presumable anxiety as symptoms start with mobility and tasks that cause stress I.e. taking medications.  Pt agreeable to get OOB to go to sink. Completed bed mobility with supervision, CGA sit > stand and ambulatory transfer to w/c using RW. At sink, pt chose to stay standing for duration of brushing teeth with pt able to maintain stance with supervision. Pt shivered, but did not express feeling cold, however was able to state "yes" to the offer of a coat. Min A needed for donning zipper front jacket.   Nurse in room to administer meds, with pt increasing in respirations and stated "no, I don't want." Therapist encouraged pt to at least take meds that were vital, as pt has a heavy amount to take and the process of consuming has demonstrated in the past to induce stress. Pt was agreeable to take partial amount. Following nurse leaving, pt expressed feelings of "want mercy" and "I'm tired," with this therapist understanding that pt was expressing feelings of being tired of fighting with life. Therapist offered support and encouragement with pt very receptive- family and team already aware of pt's emotional state. Therapist saw a note written by her daughter to pt on notepad, with pt able to voice "will you read that to me out loud?" Therapist discussed pt's d/c plan as per the note from  daughter- pt is returning to [facility] but with more assist this time.  Pt indicated need to void, with supervision sit > stand and min A ambulatory transfer from w/c > toilet as pt had R knee buckling and increased fatigue with the transition. Continent of void only. Able to complete toileting with supervision. Min A ambulatory transfer to sink for hand hygiene and EOB. Supervision bed mobility back into bed. Pt remained upright in bed, with bed alarm on and all needs in reach at end of session.   Therapy Documentation Precautions:  Precautions Precautions: Fall, Other (comment) Precaution Comments: HOH, aphaisa Restrictions Weight Bearing Restrictions: No    Therapy/Group: Individual Therapy  Blase Mess, MS, OTR/L  02/23/2022, 8:52 AM

## 2022-02-23 NOTE — Progress Notes (Signed)
Speech Language Pathology Daily Session Note  Patient Details  Name: Linda [name] MRN: [id number] Date of Birth: 11/25/1933  Today's Date: 02/23/2022 SLP Individual Time: 0950-1030 SLP Individual Time Calculation (min): 40 min  Short Term Goals: Week 2: SLP Short Term Goal 1 (Week 2): STG's = LTG's due to ELOS  Skilled Therapeutic Interventions: S: Pt seen this date for skilled ST intervention targeting communication goals outlined above. Pt received sleeping in bed; aroused easily to name. No c/o pain via "no" response. Agreeable to ST intervention at bedside. Pt intermittently tearful during today's session, and appears sad; medical team aware. Verbal perseveration persists.  O: SLP facilitated today's session by providing: - Mod-Max A verbal and visual cues for articulatory placement of alveolar, bilabial, and vowel phonemes during therapeutic language task targeting verbal production of CV word structures, in which pt achieved ~55% accuracy.  - Pt unable to complete word (CVC) to picture matching task; suspect due to decreased understanding of task despite Max A multimodal cues to aid in comprehension, in addition to reading deficits. - Pt benefited from Sup A question prompts to communicate need to utilize [facility].  A: Pt appeared minimally sitmulable for skilled ST intervention as evident by subtle improvement in response to articulatory placement cues for verbal production of bilabial, alveolar, and vowel phonemes, within CV word structures, during structured language task. This session, performance appeared limited by verbal perseveration, decreased motivation/low frustration tolerance, and motor planning deficits.  P: Pt left in bed with all safety measures activated. Call bell reviewed and within reach and all immediate needs met. Continue per current ST POC next session.  Pain Pain Assessment Pain Scale: 0-10 Pain Score: 0-No pain  Therapy/Group: Individual Therapy  Xzaviar Maloof A  Tjay Velazquez 02/23/2022, 12:35 PM

## 2022-02-23 NOTE — Progress Notes (Signed)
Occupational Therapy Discharge Summary  Patient Details  Name: Linda [name] MRN: [id number] Date of Birth: 08/01/1933  Date of Discharge from OT service:{Time; dates multiple:[id number]}   Patient has met 5 of 7 long term goals due to improved activity tolerance, improved balance, and functional use of  LEFT upper and LEFT lower extremity.  Patient to discharge at overall CGA to min A level.  Patient's care partner unavailable to provide the necessary physical and cognitive assistance at discharge therefore plan is for pt to d/c to SNF to improve functionally prior to return to ILF with family in agreement.    Reasons goals not met: LB dressing goals not met at [facility] level and toilet transfer goal not met at supervision level 2/2 poor command following, coordination, cog deficits and lability limiting carryover of education and stability with mobility/self-care tasks.  Recommendation:  Patient will benefit from ongoing skilled OT services in skilled nursing facility setting to continue to advance functional skills in the area of BADL, iADL, and Reduce care partner burden.  Equipment: N/A, default to SNF placement  Reasons for discharge: lack of progress toward goals, treatment goals met, and discharge from hospital  Patient/family agrees with progress made and goals achieved: Yes  OT Discharge Precautions/Restrictions  Precautions Precautions: Fall;Other (comment) Precaution Comments: HOH, aphaisa Restrictions Weight Bearing Restrictions: No ADL ADL Eating: Set up Where Assessed-Eating: Wheelchair Grooming: Supervision/safety Where Assessed-Grooming: Standing at sink Upper Body Bathing: Supervision/safety Where Assessed-Upper Body Bathing: Sitting at sink Lower Body Bathing: Contact guard Where Assessed-Lower Body Bathing: Sitting at sink, Standing at sink Upper Body Dressing: Setup Where Assessed-Upper Body Dressing: Sitting at sink Lower Body Dressing: Minimal  assistance Where Assessed-Lower Body Dressing: Sitting at sink, Standing at sink Toileting: Contact guard Where Assessed-Toileting: Editor, commissioning Method: Arts development officer: Grab bars, Raised toilet seat Tub/Shower Transfer: Not assessed Tub/Shower Transfer Method: Unable to assess Social research officer, government: Not assessed Social research officer, government Method: Unable to assess Vision Baseline Vision/History: 1 Wears glasses Patient Visual Report: No change from baseline Vision Assessment?: No apparent visual deficits Perception  Perception: Within Functional Limits Praxis Praxis: Impaired Praxis Impairment Details: Perseveration;Motor planning Cognition Cognition Overall Cognitive Status: Impaired/Different from baseline Arousal/Alertness: Lethargic Orientation Level: Nonverbal/unable to assess Memory: Impaired Awareness: Impaired Problem Solving: Impaired Safety/Judgment: Impaired Brief Interview for Mental Status (BIMS) Repetition of Three Words (First Attempt): No answer (unable to assess 2/2 aphasia) Temporal Orientation: Year: No answer (unable to assess 2/2 aphasia) Temporal Orientation: Month: No answer (unable to assess 2/2 aphasia) Temporal Orientation: Day: No answer (unable to assess 2/2 aphasia) Recall: "Sock": No answer (unable to assess 2/2 aphasia) Recall: "Blue": No answer (unable to assess 2/2 aphasia) Recall: "Bed": No answer (unable to assess 2/2 aphasia) BIMS Summary Score: 99 Sensation Sensation Light Touch: Appears Intact Hot/Cold: Appears Intact Proprioception: Appears Intact Additional Comments: grossly intact; difficult to get true assessment due to aphasia and [facility] Coordination Gross Motor Movements are Fluid and Coordinated: No Coordination and Movement Description: grossly uncoordinated due to mild R hemi, apraxia, HOH, impaired motor planning/sequencing, generalized weakness/deconditioning, and  decreased standing balance. Finger Nose Finger Test: unable to perform due to apraxia, HOH, and difficulty understanding instructions Motor  Motor Motor: Hemiplegia;Motor apraxia Motor - Skilled Clinical Observations: grossly uncoordinated due to mild R hemi, apraxia, HOH, impaired motor planning/sequencing, generalized weakness/deconditioning, and decreased standing balance. Mobility  Bed Mobility Bed Mobility: Rolling Right;Rolling Left;Sit to Supine;Supine to Sit Rolling Right: Independent with assistive device  Rolling Left: Independent with assistive device Supine to Sit: Independent with assistive device Sit to Supine: Independent with assistive device Transfers Sit to Stand: Supervision/Verbal cueing Stand to Sit: Supervision/Verbal cueing  Trunk/Postural Assessment  Cervical Assessment Cervical Assessment: Exceptions to [facility] (forward head) Thoracic Assessment Thoracic Assessment: Exceptions to [facility] (kyphosis) Lumbar Assessment Lumbar Assessment: Exceptions to [facility] (posterior pelvic tilt) Postural Control Postural Control: Deficits on evaluation Trunk Control: slouched Protective Responses: delayed  Balance Balance Balance Assessed: Yes Static Sitting Balance Static Sitting - Balance Support: Feet supported;Bilateral upper extremity supported Static Sitting - Level of Assistance: 6: Modified independent (Device/Increase time) Dynamic Sitting Balance Dynamic Sitting - Balance Support: Feet supported;No upper extremity supported Dynamic Sitting - Level of Assistance: 5: Stand by assistance (supervision) Static Standing Balance Static Standing - Balance Support: Bilateral upper extremity supported;During functional activity (RW) Static Standing - Level of Assistance: 5: Stand by assistance (close supervision) Dynamic Standing Balance Dynamic Standing - Balance Support: Bilateral upper extremity supported;During functional activity (RW) Dynamic Standing - Level of Assistance:  5: Stand by assistance (CGA) Dynamic Standing - Comments: CGA for transfers but min A when progressing gait Extremity/Trunk Assessment RUE Assessment RUE Assessment: Exceptions to [facility] Active Range of Motion (AROM) Comments: WFL General Strength Comments: difficult to assess formally 2/2 apraxia and aphasia however 4-/5 proximally during transfers/ADLs LUE Assessment LUE Assessment: Exceptions to [facility] Active Range of Motion (AROM) Comments: WFL General Strength Comments: difficult to assess formally 2/2 apraxia and aphasia however 4-/5 proximally during transfers/ADLs   Jordyan Hardiman E Scarlettrose Costilow, MS, OTR/L  02/23/2022, 7:55 AM

## 2022-02-23 NOTE — Progress Notes (Signed)
PROGRESS NOTE   Subjective/Complaints: Sleepy this morning  Called daughter to update her, discussed tearful mood at times, increasing dose of Prozac and she is agreeable  ROS: Limited due to aphasia   Objective:   No results found.  Recent Labs    02/20/22 1505  WBC 6.8  HGB 15.6*  HCT 46.3*  PLT 210   Recent Labs    02/20/22 1505  NA 137  K 3.4*  CL 100  CO2 26  GLUCOSE 155*  BUN 11  CREATININE 0.81  CALCIUM 9.2    Intake/Output Summary (Last 24 hours) at 02/23/2022 1157 Last data filed at 02/23/2022 0841 Gross per 24 hour  Intake 660 ml  Output --  Net 660 ml        Physical Exam: Vital Signs Blood pressure 115/89, pulse 74, temperature 98.2 F (36.8 C), resp. rate 16, height '5\' 3"'$  (1.6 m), weight 73.3 kg, SpO2 98 %.    General: awake, alert, appropriate, sitting up in bed, BMI 28.63 HENT: conjugate gaze; oropharynx moist, very hard of hearing TK:[id number] rate; no JVD Pulmonary: CTAB, no RRW  GI: soft, NT, ND, (+)BS Neurological: aphasic- saying a few words-  Skin: No evidence of breakdown, no evidence of rash Neurologic: Cranial nerves II through XII intact, motor strength is 5/5 in Left 4+/5 Right deltoid, bicep, tricep, grip, hip flexor, knee extensors, ankle dorsiflexor and plantar flexor Responding to questions better than yesterday Sensory exam Withdraws to pinch Severe aphasia, unable to follow simple commands without gestural cues, per daughter also [facility]   Musculoskeletal: no jt deformities No joint swelling  Psych: anxious and agitated at times, tearful about situation at times   Assessment/Plan: 1. Functional deficits which require 3+ hours per day of interdisciplinary therapy in a comprehensive inpatient rehab setting. Physiatrist is providing close team supervision and 24 hour management of active medical problems listed below. Physiatrist and rehab team continue to assess  barriers to discharge/monitor patient progress toward functional and medical goals  Care Tool:  Bathing    Body parts bathed by patient: Right arm, Left arm, Abdomen, Chest, Front perineal area, Buttocks, Right upper leg, Left upper leg, Right lower leg, Left lower leg, Face   Body parts bathed by helper: Right lower leg, Left lower leg     Bathing assist Assist Level: Contact Guard/Touching assist     Upper Body Dressing/Undressing Upper body dressing   What is the patient wearing?: Pull over shirt    Upper body assist Assist Level: Set up assist    Lower Body Dressing/Undressing Lower body dressing      What is the patient wearing?: Incontinence brief, Pants     Lower body assist Assist for lower body dressing: Minimal Assistance - Patient > 75%     Toileting Toileting    Toileting assist Assist for toileting: Supervision/Verbal cueing     Transfers Chair/bed transfer  Transfers assist  Chair/bed transfer activity did not occur: Safety/medical concerns  Chair/bed transfer assist level: Contact Guard/Touching assist     Locomotion Ambulation   Ambulation assist      Assist level: Minimal Assistance - Patient > 75% Assistive device: Walker-rolling Max distance: 75f  Walk 10 feet activity   Assist     Assist level: Minimal Assistance - Patient > 75% Assistive device: Walker-rolling   Walk 50 feet activity   Assist Walk 50 feet with 2 turns activity did not occur: Safety/medical concerns (fatigue, weakness/deconditioning)         Walk 150 feet activity   Assist Walk 150 feet activity did not occur: Safety/medical concerns (fatigue, weakness/deconditioning)         Walk 10 feet on uneven surface  activity   Assist Walk 10 feet on uneven surfaces activity did not occur: Safety/medical concerns (fatigue, weakness/deconditioning)         Wheelchair     Assist Is the patient using a wheelchair?: Yes Type of Wheelchair:  Manual Wheelchair activity did not occur: Safety/medical concerns (fatigue, weakness/deconditioning)         Wheelchair 50 feet with 2 turns activity    Assist    Wheelchair 50 feet with 2 turns activity did not occur: Safety/medical concerns (fatigue, weakness/deconditioning)       Wheelchair 150 feet activity     Assist  Wheelchair 150 feet activity did not occur: Safety/medical concerns (fatigue, weakness/deconditioning)       Blood pressure 115/89, pulse 74, temperature 98.2 F (36.8 C), resp. rate 16, height '5\' 3"'$  (1.6 m), weight 73.3 kg, SpO2 98 %.  Medical Problem List and Plan: 1. Functional deficits secondary to left MCA infarct/M3 occlusion status post TNK with revascularization.  ICH right frontal small hematoma likely hemorrhagic transformation status post TNK.Marland KitchenResume Eliquis in 1-2 days.              -patient may  shower             -ELOS/Goals: 02/28/22 minA  Updated daughter  Start B complex with vitamin C tablet  Continue CIR- PT, OT and SLP 2.  Antithrombotics: -DVT/anticoagulation:  Mechanical: Antiembolism stockings, thigh (TED hose) Bilateral lower extremities             -antiplatelet therapy: Aspirin 81 mg daily 3. Pain Management: Tylenol as needed  9/2- Denies pain- con't regimen 4. Depression: Increase Prozac to 20 mg HS 5. Neuropsych/cognition: This patient is not capable of making decisions on her own behalf. 6. Skin/Wound Care: Routine skin checks 7. Fluids/Electrolytes/Nutrition: Routine in and outs with follow-up chemistries 8.  Atrial fibrillation.  Restart Eliquis at 2.'5mg'$  BID. Lopressor stopped due to bradycardia, Cozaar 25 mg daily.  Cardiac rate controlled 9.  History of severe mitral regurgitation status post MitraClip 11/2020 10.  Diastolic congestive heart failure.  Lasix 20 mg daily.  Monitor for any signs of fluid overload -Weight increased 9/5, add additional '10mg'$  Lasix today Filed Weights   02/19/22 0416 02/20/22 0322  02/21/22 0357  Weight: 74.5 kg 72.5 kg 73.3 kg    11.  Diabetes mellitus.  Hemoglobin A1c 6.6.  SSI.  Patient on Farxiga 5 mg daily prior to admission.  Resume as needed  -9/4 CBGs fairly well controlled- continue current medications 12.  Hyperlipidemia.  Zetia 13.  GERD.  Protonix 14.  Epistaxis.  Packed by Dr. Hope Budds while at [facility] 15. Suboptimal vitamin D: start 1,000U daily 16. Hypokalemia: increase daily potassium supplement to 57mq daily, repeat potassium on Monday. Changed medication to kdur.  181 New left lung opacity which could be developing pneumonia: check procalcitonin level. 9/3- coughing with taking pills- asked nursing/PT to have SLP reassess.   18. Bradycardia: resolved d/ced the nebulizer treatments, likely due to vagal response from  treatments.   -9/4 Will stop metoprolol due to bradycardia 19. Sputum with flecks of blood: monitor while restarting Eliquis, discussed with nursing.  20. Lethargy improved: consulted cardiology given associated bradycardia, Toprol XL changed to HS. Trazodone discontinued 21. HTN: d/c teds 22. Insomnia: change prozac to HS dosing, continue melatonin.  23. History of PVCs off metoprolol: restarted HS Toprol given improved HR     LOS: 10 days A FACE TO FACE EVALUATION WAS PERFORMED  Martha Clan P [place]go 02/23/2022, 11:57 AM

## 2022-02-23 NOTE — Plan of Care (Signed)
  Problem: RH Dressing Goal: LTG Patient will perform lower body dressing w/assist (OT) Description: LTG: Patient will perform lower body dressing with assist, with/without cues in positioning using equipment (OT) Flowsheets (Taken 02/23/2022 0759) LTG: Pt will perform lower body dressing with assistance level of: (downgraded 2/2 decrease in activity tolerance/cog and carryover as well as increased lability limiting command following) Contact Guard/Touching assist

## 2022-02-23 NOTE — Progress Notes (Signed)
Physical Therapy Discharge Summary  Patient Details  Name: Linda [name] MRN: [id number] Date of Birth: 1933/10/12  Date of Discharge from PT service:February 26, 2022  {CHL IP REHAB PT TIME CALCULATION:304800500}  Patient has met 2 of 7 long term goals due to improved balance, ability to compensate for deficits, and improved awareness. Patient to discharge at a wheelchair level  CGA/min A, but is able to ambulate short distances with RW . Patient's care partner unavailable to provide the necessary physical and cognitive assistance at discharge. Plan for pt to discharge to SNF.  Reasons goals not met: Pt did not meet transfer goal of supervision as pt currently requires CGA due to occasional R knee buckling/bouncing. Pt also did not meet gait goals of 25 and 22f with CGA as pt currently requires min A to ambulate [id number] to occasional R knee buckling/bouncing and intermittent assist from therapist to bring RW within BOS for safety.    Recommendation:  Patient will benefit from ongoing skilled PT services in skilled nursing facility setting to continue to advance safe functional mobility, address ongoing impairments in transfers, generalized strengthening and endurance, standing balance/coordination, gait training, and to minimize fall risk.  Equipment: No equipment provided  Reasons for discharge: lack of progress toward goals and discharge from hospital  Patient/family agrees with progress made and goals achieved: Yes  PT Discharge Precautions/Restrictions Precautions Precautions: Fall;Other (comment) Precaution Comments: HOH, aphaisa Restrictions Weight Bearing Restrictions: No Pain Interference Pain Interference Pain Effect on Sleep: 0. Does not apply - I have not had any pain or hurting in the past 5 days (expressive aphasia) Pain Interference with Therapy Activities: 8. Unable to answer (expressive aphasia) Pain Interference with Day-to-Day Activities: 8. Unable to answer  (expressive aphasia) Cognition Overall Cognitive Status: Impaired/Different from baseline Arousal/Alertness: Lethargic Orientation Level: Oriented to person;Oriented to place Memory: Impaired Awareness: Impaired Problem Solving: Impaired Safety/Judgment: Impaired Comments: requires cues for RW safety and for sequencing Sensation Sensation Light Touch: Appears Intact Hot/Cold: Appears Intact Proprioception: Appears Intact Additional Comments: grossly intact, pt did state "a little" when therapist touched R lateral calf Coordination Gross Motor Movements are Fluid and Coordinated: No Fine Motor Movements are Fluid and Coordinated: No Coordination and Movement Description: grossly uncoordinated due to mild R hemi, apraxia, aphasia, HOH, impaired motor planning/sequencing, generalized weakness/dconditioning, and decreased standing balance/coordination Finger Nose Finger Test: unable to perform due to apraxia and difficulty with motor planning/sequencing - however was able to bring fingers to nose Heel Shin Test: unable to perform due to apraxia and difficulty with motor planning/sequencing - instead tapping feet up/down Motor  Motor Motor: Hemiplegia;Motor apraxia Motor - Skilled Clinical Observations: grossly uncoordinated due to mild R hemi, apraxia, aphasia, HOH, impaired motor planning/sequencing, generalized weakness/dconditioning, and decreased standing balance/coordination  Mobility Bed Mobility Bed Mobility: Rolling Right;Rolling Left;Sit to Supine;Supine to Sit Rolling Right: Independent with assistive device Rolling Left: Independent with assistive device Supine to Sit: Independent with assistive device Sit to Supine: Independent with assistive device Transfers Transfers: Sit to Stand;Stand to Sit;Stand Pivot Transfers Sit to Stand: Supervision/Verbal cueing Stand to Sit: Supervision/Verbal cueing Stand Pivot Transfers: Contact Guard/Touching assist Stand Pivot Transfer  Details: Verbal cues for sequencing;Verbal cues for safe use of DME/AE;Verbal cues for technique Stand Pivot Transfer Details (indicate cue type and reason): verbal cues for RW safety and sequencing when turning Transfer (Assistive device): Rolling walker Locomotion  Gait Ambulation: Yes Gait Assistance: Minimal Assistance - Patient > 75% Gait Distance (Feet): 55 Feet Assistive device: Rolling  walker Gait Assistance Details: Verbal cues for gait pattern;Verbal cues for sequencing;Verbal cues for technique;Verbal cues for safe use of DME/AE Gait Assistance Details: verbal cues for RW safety and multimodal cues when sitting down in WC placed behind her Gait Gait: Yes Gait Pattern: Impaired Gait Pattern: Step-to pattern;Decreased step length - right;Decreased step length - left;Decreased stance time - right;Decreased stride length;Decreased weight shift to right;Right flexed knee in stance;Poor foot clearance - left;Poor foot clearance - right;Narrow base of support;Trunk flexed Gait velocity: decreased Stairs / Additional Locomotion Stairs: No Wheelchair Mobility Wheelchair Mobility: No  Trunk/Postural Assessment  Cervical Assessment Cervical Assessment: Exceptions to [facility] (forward head) Thoracic Assessment Thoracic Assessment: Exceptions to [facility] (kyphosis) Lumbar Assessment Lumbar Assessment: Exceptions to [facility] (posterior pelvic tilt) Postural Control Postural Control: Deficits on evaluation Trunk Control: slouched Protective Responses: delayed  Balance Balance Balance Assessed: Yes Static Sitting Balance Static Sitting - Balance Support: Feet supported;Bilateral upper extremity supported Static Sitting - Level of Assistance: 6: Modified independent (Device/Increase time) Dynamic Sitting Balance Dynamic Sitting - Balance Support: Feet supported;No upper extremity supported Dynamic Sitting - Level of Assistance: 5: Stand by assistance (supervision) Static Standing Balance Static  Standing - Balance Support: Bilateral upper extremity supported;During functional activity (RW) Static Standing - Level of Assistance: 5: Stand by assistance (close supervision) Dynamic Standing Balance Dynamic Standing - Balance Support: Bilateral upper extremity supported;During functional activity (RW) Dynamic Standing - Level of Assistance: 5: Stand by assistance (CGA) Dynamic Standing - Comments: CGA for transfers but min A when progressing gait Extremity Assessment  RLE Assessment RLE Assessment: Exceptions to [facility] General Strength Comments: grossly 4-/5 in sitting - limited by apraxia, aphasia, and impaired motor planning/sequencing LLE Assessment LLE Assessment: Exceptions to [facility] General Strength Comments: grossly 4-/5 in sitting - limited by apraxia, aphasia, and impaired motor planning/sequencing  Alfonse Alpers PT, DPT  02/23/2022, 12:11 PM

## 2022-02-23 NOTE — Progress Notes (Signed)
Physical Therapy Session Note  Patient Details  Name: Linda [name] MRN: [id number] Date of Birth: 1934-06-04  Today's Date: 02/23/2022 PT Individual Time: 5176-1607 PT Individual Time Calculation (min): 41 min   Short Term Goals: Week 1:  PT Short Term Goal 1 (Week 1): pt will perform bed mobility with CGA overall PT Short Term Goal 1 - Progress (Week 1): Met PT Short Term Goal 2 (Week 1): pt will transfer bed<>chair with LRAD and supervision PT Short Term Goal 2 - Progress (Week 1): Progressing toward goal PT Short Term Goal 3 (Week 1): pt will ambulate 31f with LRAD and CGA PT Short Term Goal 3 - Progress (Week 1): Progressing toward goal Week 2:  PT Short Term Goal 1 (Week 2): STG=LTG due to LOS  Skilled Therapeutic Interventions/Progress Updates:   Received pt attempting to sit EOB and patting at brief indicating need to void. Session with emphasis on functional mobility/transfers, toileting, generalized strengthening and endurance, and gait training. Pt transferred semi-reclined<>sitting EOB with HOB elevated and supervision. Donned shoes with max A and stood with RW and min A from bed and ambulated to/from bathroom with RW and min A - intermittent R knee buckling/bouncing. Pt able to manage clothing standing with CGA and void/perform hygiene management without assist. Stood at sink and washed hands with CGA with increased R knee buckling/bouncing leaning heavily on sink for support. Went through discharge information (pain interference, sensation, and MMT) while seated in WC.   Pt verbalized being "cold" despite having jacket on - provided heat pack to warm hands. [facility] and ambulated 163fx 1 and 2554f 1 with RW and CGA (min A during episodes of R knee buckling/bouncing) with encouragement to continue. Pt continues to require cues to remain within RW BOS and for upright posture/gaze. Provided pt with taller RW to see if allowing more UE support would decrease  instances of R knee buckling; however little improvement noted. Pt requesting to return to bed, but agreed to sit up for lunch if staff would assist back to bed after eating - NT notified. Concluded session with pt sitting in WC, needs within reach, and seatbelt alarm on. Provided ice water per pt request.   Therapy Documentation Precautions:  Precautions Precautions: Fall, Other (comment) Precaution Comments: HOH, aphaisa Restrictions Weight Bearing Restrictions: No  Therapy/Group: Individual Therapy AnnAlfonse Alpers, DPT  02/23/2022, 7:06 AM

## 2022-02-24 LAB — GLUCOSE, CAPILLARY: Glucose-Capillary: 118 mg/dL — ABNORMAL HIGH (ref 70–99)

## 2022-02-24 MED ORDER — ACETAMINOPHEN 325 MG PO TABS
650.0000 mg | ORAL_TABLET | ORAL | Status: DC | PRN
Start: 2022-02-24 — End: 2022-07-27

## 2022-02-24 MED ORDER — MELATONIN 3 MG PO TABS: 3.0000 mg | ORAL_TABLET | Freq: Every day | ORAL | 0 refills | Status: AC

## 2022-02-24 MED ORDER — FUROSEMIDE 20 MG PO TABS: 20.0000 mg | ORAL_TABLET | Freq: Every day | ORAL | Status: DC

## 2022-02-24 MED ORDER — METFORMIN HCL 500 MG PO TABS: 500.0000 mg | ORAL_TABLET | Freq: Every day | ORAL | Status: DC

## 2022-02-24 MED ORDER — POTASSIUM CHLORIDE CRYS ER 20 MEQ PO TBCR
20.0000 meq | EXTENDED_RELEASE_TABLET | Freq: Every day | ORAL | Status: DC
Start: 2022-02-24 — End: 2022-06-27

## 2022-02-24 MED ORDER — VITAMIN D3 25 MCG PO TABS: 1000.0000 [IU] | ORAL_TABLET | Freq: Every day | ORAL | Status: AC

## 2022-02-24 MED ORDER — LORATADINE 10 MG PO TABS
10.0000 mg | ORAL_TABLET | Freq: Every day | ORAL | Status: AC
Start: 2022-02-24 — End: ?

## 2022-02-24 MED ORDER — PANTOPRAZOLE SODIUM 40 MG PO TBEC: 40.0000 mg | DELAYED_RELEASE_TABLET | Freq: Every day | ORAL | Status: AC

## 2022-02-24 MED ORDER — LOSARTAN POTASSIUM 25 MG PO TABS: 25.0000 mg | ORAL_TABLET | Freq: Every day | ORAL | 1 refills | Status: DC

## 2022-02-24 MED ORDER — METOPROLOL SUCCINATE ER 25 MG PO TB24: 12.5000 mg | ORAL_TABLET | Freq: Every day | ORAL | Status: DC

## 2022-02-24 MED ORDER — MAGNESIUM GLUCONATE 500 MG PO TABS: 250.0000 mg | ORAL_TABLET | Freq: Every day | ORAL | Status: AC

## 2022-02-24 MED ORDER — B COMPLEX-C PO TABS: 1.0000 | ORAL_TABLET | Freq: Every day | ORAL | Status: AC

## 2022-02-24 MED ORDER — APIXABAN 5 MG PO TABS: 5.0000 mg | ORAL_TABLET | Freq: Two times a day (BID) | ORAL | Status: AC

## 2022-02-24 MED ORDER — FLUOXETINE HCL 20 MG PO CAPS: 20.0000 mg | ORAL_CAPSULE | Freq: Every day | ORAL | 3 refills | Status: AC

## 2022-02-24 NOTE — Progress Notes (Signed)
Patient ID: [name], female   DOB: Jul 27, 1933, 88 y.o.   MRN: [id number]  Patient son, Roderic Palau will pick patient up on Monday for d/c.

## 2022-02-24 NOTE — Progress Notes (Signed)
Occupational Therapy Session Note  Patient Details  Name: Linda [name] MRN: [id number] Date of Birth: [birth year]  Today's Date: 02/24/2022 OT Individual Time: 8338-2505 OT Individual Time Calculation (min): 45 min    Short Term Goals: Week 2:  OT Short Term Goal 1 (Week 2): STG = LTG 2/2 ELOS  Skilled Therapeutic Interventions/Progress Updates:  Skilled OT intervention completed with focus on emotional support, d/c planning, self-care needs. Pt received seated EOB with nursing present administering meds. Pt remained aphasic during session however was able to state needs via pointing and some verbalization. No pain indicated. Pt with request to use bathroom. Completed CGA sit > stand using RW then CGA ambulatory transfer to toilet in bathroom with cues needed for RW positioning as pt had poor safety awareness especially going over bathroom threshold. Pt continues to demonstrate bilateral knee weakness with mild buckling but pt is typically able to recover with light min A.   Continent of bowel and bladder. Able to request wash cloths for pericare, and able to sit > stand with CGA and complete all pericare with CGA/therapist only ensuring cleanliness. Min A ambulatory transfer to sink, with pt completing hand hygiene seated in w/c with no cues or physical assist.   Pt appeared to be shivering with therapist encouraging pt to venture out of the room to retrieve warm blanket and pt agreeable. Transported pt dependently in w/c, however focus of intervention on stabilizing mood and promoting emotional well being with getting pt out of room for change of scenery. Provided warm blanket, then transported to [place] tower to sit in the sun for warmth. Therapist reminded pt of d/c plan and pt with some expression of fear of going home. Therapist encouraged pt and offered ways to cope with the changes. Back in room, pt completed ambulatory transfer to EOB with min A using RW, then mod I bed mobility. Pt remained  upright in bed with bed alarm on and all needs in reach at end of session.    Therapy Documentation Precautions:  Precautions Precautions: Fall, Other (comment) Precaution Comments: HOH, aphaisa Restrictions Weight Bearing Restrictions: No    Therapy/Group: Individual Therapy  Blase Mess, MS, OTR/L  02/24/2022, 8:51 AM

## 2022-02-24 NOTE — Progress Notes (Signed)
Speech Language Pathology Daily Session Note  Patient Details  Name: Linda [name] MRN: [id number] Date of Birth: 1934-05-10  Today's Date: 02/24/2022 SLP Individual Time: 0930-1030 SLP Individual Time Calculation (min): 60 min  Short Term Goals: Week 2: SLP Short Term Goal 1 (Week 2): STG's = LTG's due to ELOS  Skilled Therapeutic Interventions: S: Pt seen this date for skilled ST intervention targeting communication/language goals outlined above. Pt received lethargic and in bed. No c/o pain. Agreeable to ST intervention at bedside. Verbal perseveration persists. Less emotionally labile this session.  O: SLP facilitated today's session by providing:  - Mod-Max A multimodal cues to achieve ~50% accuracy during verbal production of familiar CV and CVC word structures during therapeutic language task targeting apraxia of speech. - Receptively identified common clothing and food items in a field of 2 with 95% accuracy given Min-Mod A verbal and visual cues for auditory comprehension of task and motor initiation.  - Picture to word matching (words in a field of 2) with 100% accuracy given initial Min-Mod A verbal and visual cues for comprehension of task directions, faded to independent. - Sentence to picture matching (field of 2) 50% accuracy given Min A verbal cues for task comprehension, faded to independent. Total A for 100% accuracy.  - Max to Total A for sentence completion task.  A: Pt appeared minimally sitmulable for skilled ST intervention as evident by subtle improvement in response to articulatory placement cues for verbal production of bilabial, alveolar, and vowel phonemes, within CV and CVC word structures, during structured language task. Pt continues to utilize gestures and written choice prompts vs verbal expression to communicate immediate wants and needs. Will attempt verbalization with encouragement, though can typically only verbalize phrase and not specific item needed.  P:  Pt left in bed with direct hand off to NT for toileting needs. Continue per current ST POC next session.   Pain    Therapy/Group: Individual Therapy  Ingra Rother A Nicklos Gaxiola 02/24/2022, 1:35 PM

## 2022-02-24 NOTE — Progress Notes (Signed)
PROGRESS NOTE   Subjective/Complaints: Patient refusing potassium supplement and other supplements/medications that she feels are not important Tolerated  therapy today  ROS: Limited due to aphasia   Objective:   No results found.  No results for input(s): "WBC", "HGB", "HCT", "PLT" in the last 72 hours.  No results for input(s): "NA", "K", "CL", "CO2", "GLUCOSE", "BUN", "CREATININE", "CALCIUM" in the last 72 hours.   Intake/Output Summary (Last 24 hours) at 02/24/2022 1236 Last data filed at 02/24/2022 0720 Gross per 24 hour  Intake 676 ml  Output --  Net 676 ml        Physical Exam: Vital Signs Blood pressure 123/68, pulse 84, temperature 97.8 F (36.6 C), temperature source Oral, resp. rate 15, height '5\' 3"'$  (1.6 m), weight 73.3 kg, SpO2 94 %.    General: awake, alert, appropriate, sitting up in bed, BMI 28.63 HENT: conjugate gaze; oropharynx moist, very hard of hearing OZ:[id number] rate; no JVD Pulmonary: CTAB, no RRW  GI: soft, NT, ND, (+)BS Neurological: aphasic- saying a few words-  Skin: No evidence of breakdown, no evidence of rash Neurologic: Cranial nerves II through XII intact, motor strength is 5/5 in Left 4+/5 Right deltoid, bicep, tricep, grip, hip flexor, knee extensors, ankle dorsiflexor and plantar flexor Responding to questions better than yesterday Sensory exam Withdraws to pinch Severe aphasia, unable to follow simple commands without gestural cues, per daughter also HOH   Musculoskeletal: no jt deformities No joint swelling  Psych: anxious and agitated at times, tearful about situation at times, unmotivated to participate with therapy   Assessment/Plan: 1. Functional deficits which require 3+ hours per day of interdisciplinary therapy in a comprehensive inpatient rehab setting. Physiatrist is providing close team supervision and 24 hour management of active medical problems listed  below. Physiatrist and rehab team continue to assess barriers to discharge/monitor patient progress toward functional and medical goals  Care Tool:  Bathing    Body parts bathed by patient: Right arm, Left arm, Abdomen, Chest, Front perineal area, Buttocks, Right upper leg, Left upper leg, Right lower leg, Left lower leg, Face   Body parts bathed by helper: Right lower leg, Left lower leg     Bathing assist Assist Level: Contact Guard/Touching assist     Upper Body Dressing/Undressing Upper body dressing   What is the patient wearing?: Pull over shirt    Upper body assist Assist Level: Set up assist    Lower Body Dressing/Undressing Lower body dressing      What is the patient wearing?: Incontinence brief, Pants     Lower body assist Assist for lower body dressing: Minimal Assistance - Patient > 75%     Toileting Toileting    Toileting assist Assist for toileting: Supervision/Verbal cueing     Transfers Chair/bed transfer  Transfers assist  Chair/bed transfer activity did not occur: Safety/medical concerns  Chair/bed transfer assist level: Contact Guard/Touching assist     Locomotion Ambulation   Ambulation assist      Assist level: Minimal Assistance - Patient > 75% Assistive device: Walker-rolling Max distance: 63f   Walk 10 feet activity   Assist     Assist level: Minimal Assistance - Patient >  75% Assistive device: Walker-rolling   Walk 50 feet activity   Assist Walk 50 feet with 2 turns activity did not occur: Safety/medical concerns (fatigue, weakness/deconditioning)         Walk 150 feet activity   Assist Walk 150 feet activity did not occur: Safety/medical concerns (fatigue, weakness/deconditioning)         Walk 10 feet on uneven surface  activity   Assist Walk 10 feet on uneven surfaces activity did not occur: Safety/medical concerns (fatigue, weakness/deconditioning)         Wheelchair     Assist Is the  patient using a wheelchair?: Yes Type of Wheelchair: Manual Wheelchair activity did not occur: Safety/medical concerns (fatigue, weakness/deconditioning)         Wheelchair 50 feet with 2 turns activity    Assist    Wheelchair 50 feet with 2 turns activity did not occur: Safety/medical concerns (fatigue, weakness/deconditioning)       Wheelchair 150 feet activity     Assist  Wheelchair 150 feet activity did not occur: Safety/medical concerns (fatigue, weakness/deconditioning)       Blood pressure 123/68, pulse 84, temperature 97.8 F (36.6 C), temperature source Oral, resp. rate 15, height '5\' 3"'$  (1.6 m), weight 73.3 kg, SpO2 94 %.  Medical Problem List and Plan: 1. Functional deficits secondary to left MCA infarct/M3 occlusion status post TNK with revascularization.  ICH right frontal small hematoma likely hemorrhagic transformation status post TNK.Marland KitchenResume Eliquis in 1-2 days.              -patient may  shower             -ELOS/Goals: 02/28/22 minA  Updated daughter  Start B complex with vitamin C tablet  Continue CIR- PT, OT and SLP 2.  Impaired mobility: -DVT/anticoagulation:  Mechanical: Antiembolism stockings, thigh (TED hose) Bilateral lower extremities             -antiplatelet therapy: continue Aspirin 81 mg daily 3. Pain Management: Tylenol as needed 4. Depression: Continue Prozac to 20 mg HS, discussed increase with daughter 5. Neuropsych/cognition: This patient is not capable of making decisions on her own behalf. 6. Skin/Wound Care: Routine skin checks 7. Fluids/Electrolytes/Nutrition: Routine in and outs with follow-up chemistries 8.  Atrial fibrillation.  Restart Eliquis at 2.'5mg'$  BID. Toprol XL 12.5 restarted HS, Cozaar 25 mg daily.  Cardiac rate controlled 9.  History of severe mitral regurgitation status post MitraClip 11/2020 10.  Diastolic congestive heart failure.  Lasix 20 mg daily.  Monitor for any signs of fluid overload -Weight increased 9/5, add  additional '10mg'$  Lasix today Filed Weights   02/19/22 0416 02/20/22 0322 02/21/22 0357  Weight: 74.5 kg 72.5 kg 73.3 kg    11.  Diabetes mellitus.  Hemoglobin A1c 6.6.  SSI.  Patient on Farxiga 5 mg daily prior to admission.  Resume as needed  -9/4 CBGs fairly well controlled- continue current medications 12.  Hyperlipidemia.  Zetia 13.  GERD.  Protonix 14.  Epistaxis.  Packed by Dr. Hope Budds while at [facility] 15. Suboptimal vitamin D: start 1,000U daily 16. Hypokalemia: increase daily potassium supplement to 43mq daily, repeat potassium on Monday. Changed medication to kdur.  127 New left lung opacity which could be developing pneumonia: check procalcitonin level. 9/3- coughing with taking pills- asked nursing/PT to have SLP reassess.   18. Bradycardia: resolved d/ced the nebulizer treatments, likely due to vagal response from treatments.   -9/4 Will stop metoprolol due to bradycardia 19. Sputum with flecks of  blood: monitor while restarting Eliquis, discussed with nursing.  20. Lethargy improved: consulted cardiology given associated bradycardia, Toprol XL changed to HS. Trazodone discontinued 21. HTN: d/c teds 22. Insomnia: change prozac to HS dosing, continue melatonin.  23. History of PVCs off metoprolol: restarted HS Toprol given improved HR     LOS: 11 days A FACE TO FACE EVALUATION WAS PERFORMED  Clide Deutscher Kyri Shader 02/24/2022, 12:36 PM

## 2022-02-24 NOTE — Progress Notes (Signed)
Physical Therapy Session Note  Patient Details  Name: Linda [name] MRN: [id number] Date of Birth: 05/27/1934  Today's Date: 02/24/2022 PT Individual Time: 1100-1155 PT Individual Time Calculation (min): 55 min   Short Term Goals: Week 1:  PT Short Term Goal 1 (Week 1): pt will perform bed mobility with CGA overall PT Short Term Goal 1 - Progress (Week 1): Met PT Short Term Goal 2 (Week 1): pt will transfer bed<>chair with LRAD and supervision PT Short Term Goal 2 - Progress (Week 1): Progressing toward goal PT Short Term Goal 3 (Week 1): pt will ambulate 25ft with LRAD and CGA PT Short Term Goal 3 - Progress (Week 1): Progressing toward goal Week 2:  PT Short Term Goal 1 (Week 2): STG=LTG due to LOS  Skilled Therapeutic Interventions/Progress Updates:   Received pt semi-reclined in bed and with continued expressive aphasia but did not c/o pain during session. Session with emphasis on functional mobility/transfers, generalized strengthening and endurance, dynamic standing balance/coordination, and gait training. Pt transferred semi-reclined<>sitting EOB with HOB elevated and supervision and donned slip on shoes with max A. Pt transferred sit<>stand from EOB with RW and min A (poor carry over of cues to push up from bed rather than pulling up on RW) and RN inspected R hip for wound. Pt then transferred bed<>WC stand<>pivot with RW and CGA then became labile stating "I don't" and crying. Pt indicting to therapist she did not want to leave room but with encouragement, agreed to go outside for change in environmental stimuli. Pt transported outside to entrance of [facility] in WC dependently. Once outside pt stated "I need water" and declined any ambulation outside; perseverating on water. Got pt water from [employer], but then pt refused to return outside to ambulate. With encouragement, pt agreed to ambulate around [employer]. Stood with RW and CGA and ambulated 25ft x 1 and 15ft x 1 with RW and CGA/min A - pt  continues to demonstrate R knee buckling/bouncing and requires cues to keep RW within BOS for safety. Returned to room and transferred sit<>stand with RW and CGA from WC armrests and pt verbalized "pee" - ambulated in/out of bathroom with RW and CGA/min A. Pt able to manage clothing standing with CGA and void/perform peri-care without assist. Stood at sink and brushed teeth/washed hands with heavy CGA with reliance on sink for support. Concluded session with pt sitting in recliner, needs within reach, and seatbelt alarm on.   Therapy Documentation Precautions:  Precautions Precautions: Fall, Other (comment) Precaution Comments: HOH, aphaisa Restrictions Weight Bearing Restrictions: No  Therapy/Group: Individual Therapy  M    PT, DPT  02/24/2022, 6:57 AM  

## 2022-02-24 NOTE — Progress Notes (Signed)
Pt refused meds this A.M. educated pt on medications and explained why she needed each medication. Pt agreed to take "important" meds but not all of them. Nurse tried to educated pt on potassium, pt still refused medication, stating "no, terrible".

## 2022-02-25 MED ORDER — METFORMIN HCL 500 MG PO TABS
500.0000 mg | ORAL_TABLET | Freq: Two times a day (BID) | ORAL | Status: DC
  Administered 2022-02-25 – 2022-02-27 (×4): 500 mg via ORAL
  Filled 2022-02-25 (×4): qty 1

## 2022-02-25 NOTE — Progress Notes (Signed)
Occupational Therapy Session Note  Patient Details  Name: Linda [name] MRN: [id number] Date of Birth: 02/26/1934  Today's Date: 02/25/2022 OT Individual Time: 0950-1100 OT Individual Time Calculation (min): 70 min    Short Term Goals: Week 2:  OT Short Term Goal 1 (Week 2): STG = LTG 2/2 ELOS  Skilled Therapeutic Interventions/Progress Updates:  Skilled OT intervention completed with focus on self-care needs, functional transfers and cog/sequencing tasks for ADL management. Pt received seated EOB, no c/o pain indicated during session. Pt remained aphasic however only presented min lability during session but was easily redirected. Per nursing, family was upset yesterday that pt had not had shower during her rehab stay, with therapist educating pt on 15/7 therapy schedule from lack of previous participation, that causes limitations with therapy tasks. Offered pt a shower this morning due to lengthy time however pt declined and got a little emotional with therapist trying to encourage and demonstrate option of fully showering in bathroom. Pt ultimately declined shower however did indicate need to use bathroom.  Pt completed mod I bed mobility to EOB, then CGA sit > stand using RW, and min A ambulatory transfer using RW to toilet. Cues needed for RW safety and light assist for R knee buckling. Continent of void only. Able to manage toileting steps with CGA for balance. Ambulated at min A to sink, with pt able to wash hands in stance with CGA and brush teeth.   Pt wheeled herself in w/c to EOB where she initiated straightening up her bed. Shifted to IADL management and allowing pt to initiate tasks desired, with pt helping assist therapist straighten up multiple blankets etc. Pt then agreeable to have therapy outside. Transported dependently in w/c <> outside courtyard.  Seated in w/c at table top, pt completed medium level puzzle with large pieces. Pt identified that she needed to flip pieces over to  see all pictures. Min-mod difficulty with sequencing/orienting pieces with minor increased respirations from anxiety noted when pt became frustrated with not selecting the right piece. Able to request assist form therapist and was able to verbalize/recognize a piece that had fallen on the ground.   Back in room, completed CGA ambulatory transfer to EOB, mod I bed mobility and remained semi-supine per request, with bed alarm on and all needs in reach at end of session.   Therapy Documentation Precautions:  Precautions Precautions: Fall, Other (comment) Precaution Comments: HOH, aphaisa Restrictions Weight Bearing Restrictions: No    Therapy/Group: Individual Therapy  Blase Mess, MS, OTR/L  02/25/2022, 12:07 PM

## 2022-02-25 NOTE — Progress Notes (Signed)
Speech Language Pathology Daily Session Note  Patient Details  Name: MERON BOCCHINO MRN: [id number] Date of Birth: Jul 19, 1933  Today's Date: 02/25/2022 SLP Individual Time: 0730-0830 SLP Individual Time Calculation (min): 60 min  Short Term Goals: Week 2: SLP Short Term Goal 1 (Week 2): STG's = LTG's due to ELOS  Skilled Therapeutic Interventions: Pt seen for skilled ST with focus on speech and language goals, pt received asleep in bed and requiring more than a reasonable amount of time to rouse for therapeutic tasks. Pt adamantly denying use of hearing aids at this time, able to comprehend simple phrases with increased volume, extra time and apparent lip reading. SLP facilitating picture to word matching in field of 2 by providing overall min A verbal and visual cues with 100% accuracy. SLP providing pt with calendar and notebook in room, able to respond accurately to simple yes/no questions related to family with Supervision A cues for comprehension during task and 100% accuracy. Pt able to follow 1-step directions with min A during transfer to and from [facility]. Pt minimally emotional labile throughout session, did perseverate on "honey" and "mercy". Pt left in bed with alarm set and all needs met. Reoriented to soft touch call button and encouraged to utilize for assistance. Cont ST POC.  Pain Pain Assessment Pain Scale: 0-10 Pain Score: 0-No pain  Therapy/Group: Individual Therapy  Dewaine Conger 02/25/2022, 8:14 AM

## 2022-02-25 NOTE — Progress Notes (Signed)
PROGRESS NOTE   Subjective/Complaints: Asks me to close bathroom door Does not want to use hearing aids Demonstrating good understanding Wants to rest  ROS: Limtied due to aphasia   Objective:   No results found.  No results for input(s): "WBC", "HGB", "HCT", "PLT" in the last 72 hours.  No results for input(s): "NA", "K", "CL", "CO2", "GLUCOSE", "BUN", "CREATININE", "CALCIUM" in the last 72 hours.   Intake/Output Summary (Last 24 hours) at 02/25/2022 1512 Last data filed at 02/25/2022 1245 Gross per 24 hour  Intake 700 ml  Output --  Net 700 ml        Physical Exam: Vital Signs Blood pressure (!) 90/59, pulse 60, temperature 97.9 F (36.6 C), resp. rate 20, height '5\' 3"'$  (1.6 m), weight 73.3 kg, SpO2 98 %.    General: awake, alert, appropriate, sitting up in bed, BMI 28.63 HENT: conjugate gaze; oropharynx moist, very hard of hearing CV: heart rate normal; no JVD Pulmonary: CTAB, no RRW  GI: soft, NT, ND, (+)BS Neurological: aphasic- saying a few words-  Skin: No evidence of breakdown, no evidence of rash Neurologic: Cranial nerves II through XII intact, motor strength is 5/5 in Left 4+/5 Right deltoid, bicep, tricep, grip, hip flexor, knee extensors, ankle dorsiflexor and plantar flexor Responding to questions better than yesterday Sensory exam Withdraws to pinch Severe aphasia, unable to follow simple commands without gestural cues, per daughter also HOH   Musculoskeletal: no jt deformities No joint swelling  Psych: anxious and agitated at times, tearful about situation at times, unmotivated to participate with therapy   Assessment/Plan: 1. Functional deficits which require 3+ hours per day of interdisciplinary therapy in a comprehensive inpatient rehab setting. Physiatrist is providing close team supervision and 24 hour management of active medical problems listed below. Physiatrist and rehab team continue  to assess barriers to discharge/monitor patient progress toward functional and medical goals  Care Tool:  Bathing    Body parts bathed by patient: Right arm, Left arm, Abdomen, Chest, Front perineal area, Buttocks, Right upper leg, Left upper leg, Right lower leg, Left lower leg, Face   Body parts bathed by helper: Right lower leg, Left lower leg     Bathing assist Assist Level: Contact Guard/Touching assist     Upper Body Dressing/Undressing Upper body dressing   What is the patient wearing?: Pull over shirt    Upper body assist Assist Level: Set up assist    Lower Body Dressing/Undressing Lower body dressing      What is the patient wearing?: Incontinence brief, Pants     Lower body assist Assist for lower body dressing: Minimal Assistance - Patient > 75%     Toileting Toileting    Toileting assist Assist for toileting: Supervision/Verbal cueing     Transfers Chair/bed transfer  Transfers assist  Chair/bed transfer activity did not occur: Safety/medical concerns  Chair/bed transfer assist level: Contact Guard/Touching assist     Locomotion Ambulation   Ambulation assist      Assist level: Minimal Assistance - Patient > 75% Assistive device: Walker-rolling Max distance: 26f   Walk 10 feet activity   Assist     Assist level: Minimal Assistance -  Patient > 75% Assistive device: Walker-rolling   Walk 50 feet activity   Assist Walk 50 feet with 2 turns activity did not occur: Safety/medical concerns (fatigue, weakness/deconditioning)         Walk 150 feet activity   Assist Walk 150 feet activity did not occur: Safety/medical concerns (fatigue, weakness/deconditioning)         Walk 10 feet on uneven surface  activity   Assist Walk 10 feet on uneven surfaces activity did not occur: Safety/medical concerns (fatigue, weakness/deconditioning)         Wheelchair     Assist Is the patient using a wheelchair?: Yes Type of  Wheelchair: Manual Wheelchair activity did not occur: Safety/medical concerns (fatigue, weakness/deconditioning)         Wheelchair 50 feet with 2 turns activity    Assist    Wheelchair 50 feet with 2 turns activity did not occur: Safety/medical concerns (fatigue, weakness/deconditioning)       Wheelchair 150 feet activity     Assist  Wheelchair 150 feet activity did not occur: Safety/medical concerns (fatigue, weakness/deconditioning)       Blood pressure (!) 90/59, pulse 60, temperature 97.9 F (36.6 C), resp. rate 20, height '5\' 3"'$  (1.6 m), weight 73.3 kg, SpO2 98 %.  Medical Problem List and Plan: 1. Functional deficits secondary to left MCA infarct/M3 occlusion status post TNK with revascularization.  ICH right frontal small hematoma likely hemorrhagic transformation status post TNK.Marland KitchenResume Eliquis in 1-2 days.              -patient may  shower             -ELOS/Goals: 02/28/22 minA  Updated daughter  Start B complex with vitamin C tablet  Continue CIR- PT, OT and SLP 2.  Impaired mobility: -DVT/anticoagulation:  Mechanical: Antiembolism stockings, thigh (TED hose) Bilateral lower extremities             -antiplatelet therapy: continue Aspirin 81 mg daily 3. Pain Management: Tylenol as needed 4. Depression: continue Prozac to 20 mg HS, discussed increase with daughter 5. Neuropsych/cognition: This patient is not capable of making decisions on her own behalf. 6. Skin/Wound Care: Routine skin checks 7. Fluids/Electrolytes/Nutrition: Routine in and outs with follow-up chemistries 8.  Atrial fibrillation.  Restart Eliquis at 2.'5mg'$  BID. Toprol XL 12.5 restarted HS, continue Cozaar 25 mg daily.  Cardiac rate controlled 9.  History of severe mitral regurgitation status post MitraClip 11/2020 10.  Diastolic congestive heart failure.  Lasix 20 mg daily.  Monitor for any signs of fluid overload -Messaged nursing to check weight today Filed Weights   02/19/22 0416 02/20/22  0322 02/21/22 0357  Weight: 74.5 kg 72.5 kg 73.3 kg    11.  Diabetes mellitus.  Hemoglobin A1c 6.6.  SSI.  Patient on Farxiga 5 mg daily prior to admission.  Resume as needed  Increase metformin to '500mg'$  BID 12.  Hyperlipidemia.  LDL reviewed and is 77. Is on Zetia 13.  GERD.  Protonix 14.  Epistaxis.  Packed by Dr. Hope Budds while at [facility] 15. Suboptimal vitamin D: start 1,000U daily 16. Hypokalemia: increase daily potassium supplement to 36mq daily, repeat potassium on Monday. Changed medication to kdur.  158 New left lung opacity which could be developing pneumonia: check procalcitonin level. 9/3- coughing with taking pills- asked nursing/PT to have SLP reassess.   18. Bradycardia: resolved d/ced the nebulizer treatments, likely due to vagal response from treatments.   -9/4 Will stop metoprolol due to bradycardia 19. Sputum  with flecks of blood: monitor while restarting Eliquis, discussed with nursing.  20. Lethargy improved: consulted cardiology given associated bradycardia, Toprol XL changed to HS. Trazodone discontinued 21. HTN: d/c teds 22. Insomnia: change prozac to HS dosing, continue melatonin.  23. History of PVCs off metoprolol: restarted HS Toprol given improved HR     LOS: 12 days A FACE TO FACE EVALUATION WAS PERFORMED  Linda [name] P Shreshta Medley 02/25/2022, 3:12 PM

## 2022-02-26 MED ORDER — METOPROLOL SUCCINATE ER 25 MG PO TB24: 12.5000 mg | ORAL_TABLET | ORAL | Status: DC

## 2022-02-26 NOTE — Progress Notes (Signed)
Speech Language Pathology Discharge Summary  Patient Details  Name: Linda [name] MRN: [id number] Date of Birth: April 22, 1934  Date of Discharge from SLP service:February 26, 2022  Today's Date: 02/26/2022 SLP Individual Time: 0107-0145 SLP Individual Time Calculation (min): 38 min    Patient has met 2 of 3 long term goals.  Patient to discharge at overall Mod level.  Reasons goals not met: requires min-mod A for comprehension of basic information   Clinical Impression/Discharge Summary:   Discharge session completed with family. Daughter stated patient is planning to go to to snf in her [place] community. Educated daughter on strategies to help pt with word finding during communication including phonemic cues, visual cues to mouth placement, and automatic speech tasks. Pt will benefit from cont skilled slp intervention to increase her comprehension and expression. Encouraged daughter to request communication book for patient to communicate with staff. Pt has made progress with use of gestures and single words to communicate her wants and needs. Daughter reports increased sentence formulation during functional tasks to request her wants/needs.  Pt met 2/3 long term goals.    Care Partner:  Caregiver Able to Provide Assistance: Yes  Type of Caregiver Assistance: Cognitive;Physical  Recommendation:  Skilled Nursing facility  Rationale for SLP Follow Up: Maximize cognitive function and independence;Maximize functional communication   Equipment:    None recommended.   Reasons for discharge: Other (comment) (dc to snf in her [place])   Patient/Family Agrees with Progress Made and Goals Achieved: Yes    Darrol Poke Nussen Pullin 02/26/2022, 2:14 PM

## 2022-02-26 NOTE — Plan of Care (Signed)
  Problem: RH Comprehension Communication Goal: LTG Patient will comprehend basic/complex auditory (SLP) Description: LTG: Patient will comprehend basic/complex auditory information with cues (SLP). Outcome: Not Met (add Reason) Flowsheets Taken 02/26/2022 1410 by Junius Argyle A, CCC-SLP LTG: Patient will comprehend auditory information with cueing (SLP):  Minimal Assistance - Patient > 75%  Moderate Assistance - Patient 50 - 74% Taken 02/14/2022 1656 by Helaine Chess A, CCC-SLP LTG: Patient will comprehend: Basic auditory information Note: Requires min A for basic information.    Problem: RH Expression Communication Goal: LTG Patient will express needs/wants via multi-modal(SLP) Description: LTG:  Patient will express needs/wants via multi-modal communication (gestures/written, etc) with cues (SLP) Outcome: Completed/Met Flowsheets (Taken 02/14/2022 1656 by Helaine Chess A, CCC-SLP) LTG: Patient will express needs/wants via multimodal communication (gestures/written, etc) with cueing (SLP): Minimal Assistance - Patient > 75%   Problem: RH Expression Communication Goal: LTG Patient will verbally express basic/complex needs(SLP) Description: LTG:  Patient will verbally express basic/complex needs, wants or ideas with cues  (SLP) Outcome: Completed/Met   Problem: RH Expression Communication Goal: LTG Patient will express needs/wants via multi-modal(SLP) Description: LTG:  Patient will express needs/wants via multi-modal communication (gestures/written, etc) with cues (SLP) Outcome: Completed/Met Flowsheets (Taken 02/14/2022 1656 by Helaine Chess A, CCC-SLP) LTG: Patient will express needs/wants via multimodal communication (gestures/written, etc) with cueing (SLP): Minimal Assistance - Patient > 75% Goal: LTG Patient will verbally express basic/complex needs(SLP) Description: LTG:  Patient will verbally express basic/complex needs, wants or ideas with cues  (SLP) Outcome:  Completed/Met

## 2022-02-26 NOTE — Progress Notes (Signed)
Physical Therapy Session Note  Patient Details  Name: Linda [name] MRN: [id number] Date of Birth: 04-05-1934  Today's Date: 02/26/2022 PT Individual Time: 0800-0900 PT Individual Time Calculation (min): 60 min   Short Term Goals: Week 1:  PT Short Term Goal 1 (Week 1): pt will perform bed mobility with CGA overall PT Short Term Goal 1 - Progress (Week 1): Met PT Short Term Goal 2 (Week 1): pt will transfer bed<>chair with LRAD and supervision PT Short Term Goal 2 - Progress (Week 1): Progressing toward goal PT Short Term Goal 3 (Week 1): pt will ambulate 12f with LRAD and CGA PT Short Term Goal 3 - Progress (Week 1): Progressing toward goal Week 2:  PT Short Term Goal 1 (Week 2): STG=LTG due to LOS     Skilled Therapeutic Interventions/Progress Updates:  Pt resting in bed.  She did not demonstrate signs of pain during session.  In flat bed , rolling L and sitting up modified independently.  Sit> stand to RW with CGA, gestural cues for hand placements.  Pt donned shoes with set up, supervision. Pivot transfer to wc to L with close supervision.  neuromuscular re-education via gestures and tactile cues for 10 x 1 R/L straight leg raises.  Use of Kinetron in sitting in wc, 40 cm/sec resistance, x 1 minute x 2.  Therapeutic activity, in sitting, throwing beach ball over 5' distance, x 25, targeting PT standing at various positions.   Sit> stand to RW with close supervision.  Gait training with RW on level floor, x 50' including turns, with CG/min assist due to pt buckling at bil knees intermittently with RW then becoming too far ahead for safety.  Up/down 3 steps 2 rails, CGA, self selected step to method, leading up with LLE, and down with RLE.  By 3rd step, pt's R knee starting to buckle.  Toilet transfer with close supervision.  Pt managed clothes in standing and was continent of urine.  Peri care independently in sitting.  In flat bed, stand> sit with close supervision.  Sit> supine  into flat bed no rails, independent.  At end of session, pt resting in bed with needs at hand and alarm set.       Therapy Documentation Precautions:  Precautions Precautions: Fall, Other (comment) Precaution Comments: HOH, aphaisa Restrictions Weight Bearing Restrictions: No      Therapy/Group: Individual Therapy  Andria Head 02/26/2022, 10:26 AM

## 2022-02-26 NOTE — Progress Notes (Signed)
PROGRESS NOTE   Subjective/Complaints: Sleepy now after working with PT Walked 50 feet with RW with PT today Patient's chart reviewed- No issues reported overnight Vitals signs stable except for bradycardia  ROS: Limited due to aphasia   Objective:   No results found.  No results for input(s): "WBC", "HGB", "HCT", "PLT" in the last 72 hours.  No results for input(s): "NA", "K", "CL", "CO2", "GLUCOSE", "BUN", "CREATININE", "CALCIUM" in the last 72 hours.   Intake/Output Summary (Last 24 hours) at 02/26/2022 1053 Last data filed at 02/26/2022 0700 Gross per 24 hour  Intake 700 ml  Output --  Net 700 ml        Physical Exam: Vital Signs Blood pressure 131/86, pulse (!) 51, temperature 98.1 F (36.7 C), temperature source Oral, resp. rate 18, height '5\' 3"'$  (1.6 m), weight 73.5 kg, SpO2 94 %.    General: awake, alert, appropriate, sitting up in bed, BMI 28.63 HENT: conjugate gaze; oropharynx moist, very hard of hearing CV: heart rate normal; no JVD Pulmonary: CTAB, no RRW  GI: soft, NT, ND, (+)BS Neurological: aphasic- saying a few words-  Skin: No evidence of breakdown, no evidence of rash Neurologic: Cranial nerves II through XII intact, motor strength is 5/5 in Left 4+/5 Right deltoid, bicep, tricep, grip, hip flexor, knee extensors, ankle dorsiflexor and plantar flexor Responding to questions better than yesterday Sensory exam Withdraws to pinch Severe aphasia, unable to follow simple commands without gestural cues, per daughter also HOH   Musculoskeletal: no jt deformities No joint swelling  Ambulating with RW CG/MinA Psych: anxious and agitated at times, tearful about situation at times, unmotivated to participate with therapy   Assessment/Plan: 1. Functional deficits which require 3+ hours per day of interdisciplinary therapy in a comprehensive inpatient rehab setting. Physiatrist is providing close team  supervision and 24 hour management of active medical problems listed below. Physiatrist and rehab team continue to assess barriers to discharge/monitor patient progress toward functional and medical goals  Care Tool:  Bathing    Body parts bathed by patient: Right arm, Left arm, Abdomen, Chest, Front perineal area, Buttocks, Right upper leg, Left upper leg, Right lower leg, Left lower leg, Face   Body parts bathed by helper: Right lower leg, Left lower leg     Bathing assist Assist Level: Contact Guard/Touching assist     Upper Body Dressing/Undressing Upper body dressing   What is the patient wearing?: Pull over shirt    Upper body assist Assist Level: Set up assist    Lower Body Dressing/Undressing Lower body dressing      What is the patient wearing?: Incontinence brief, Pants     Lower body assist Assist for lower body dressing: Minimal Assistance - Patient > 75%     Toileting Toileting    Toileting assist Assist for toileting: Supervision/Verbal cueing     Transfers Chair/bed transfer  Transfers assist  Chair/bed transfer activity did not occur: Safety/medical concerns  Chair/bed transfer assist level: Supervision/Verbal cueing     Locomotion Ambulation   Ambulation assist      Assist level: Minimal Assistance - Patient > 75% Assistive device: Walker-rolling Max distance: 50   Walk  10 feet activity   Assist     Assist level: Minimal Assistance - Patient > 75% Assistive device: Walker-rolling   Walk 50 feet activity   Assist Walk 50 feet with 2 turns activity did not occur: Safety/medical concerns (fatigue, weakness/deconditioning)  Assist level: Minimal Assistance - Patient > 75% Assistive device: Walker-rolling    Walk 150 feet activity   Assist Walk 150 feet activity did not occur: Safety/medical concerns (weakness)         Walk 10 feet on uneven surface  activity   Assist Walk 10 feet on uneven surfaces activity did not  occur: Safety/medical concerns (weakness)         Wheelchair     Assist Is the patient using a wheelchair?: Yes Type of Wheelchair: Manual Wheelchair activity did not occur: Safety/medical concerns (fatigue, weakness/deconditioning)  Wheelchair assist level: Minimal Assistance - Patient > 75% Max wheelchair distance: 25    Wheelchair 50 feet with 2 turns activity    Assist    Wheelchair 50 feet with 2 turns activity did not occur: Safety/medical concerns (activity tolerance)       Wheelchair 150 feet activity     Assist  Wheelchair 150 feet activity did not occur: Safety/medical concerns (activity tolerance)       Blood pressure 131/86, pulse (!) 51, temperature 98.1 F (36.7 C), temperature source Oral, resp. rate 18, height '5\' 3"'$  (1.6 m), weight 73.5 kg, SpO2 94 %.  Medical Problem List and Plan: 1. Functional deficits secondary to left MCA infarct/M3 occlusion status post TNK with revascularization.  ICH right frontal small hematoma likely hemorrhagic transformation status post TNK.Marland KitchenResume Eliquis in 1-2 days.              -patient may  shower             -ELOS/Goals: 02/28/22 minA  Updated daughter  Start B complex with vitamin C tablet  Continue CIR- PT, OT and SLP 2.  Impaired mobility: -DVT/anticoagulation:  Mechanical: Antiembolism stockings, thigh (TED hose) Bilateral lower extremities             -antiplatelet therapy: continue Aspirin 81 mg daily 3. Pain Management: Tylenol as needed 4. Depression: continue Prozac to 20 mg HS, discussed increase with daughter 5. Neuropsych/cognition: This patient is not capable of making decisions on her own behalf. 6. Skin/Wound Care: Routine skin checks 7. Fluids/Electrolytes/Nutrition: Routine in and outs with follow-up chemistries 8.  Atrial fibrillation.  Restart Eliquis at 2.'5mg'$  BID. Toprol XL 12.5 restarted HS, continue Cozaar 25 mg daily.  Cardiac rate controlled 9.  History of severe mitral regurgitation  status post MitraClip 11/2020 10.  Diastolic congestive heart failure.  Lasix 20 mg daily.  Monitor for any signs of fluid overload -Messaged nursing to check weight today Filed Weights   02/20/22 0322 02/21/22 0357 02/25/22 1700  Weight: 72.5 kg 73.3 kg 73.5 kg    11.  Diabetes mellitus.  Hemoglobin A1c 6.6.  SSI.  Patient on Farxiga 5 mg daily prior to admission.  Resume as needed  Increase metformin to '500mg'$  BID 12.  Hyperlipidemia.  LDL reviewed and is 77. Is on Zetia 13.  GERD.  Protonix 14.  Epistaxis.  Packed by Dr. Hope Budds while at [facility] 15. Suboptimal vitamin D: start 1,000U daily 16. Hypokalemia: increase daily potassium supplement to 43mq daily, repeat potassium on Monday. Changed medication to kdur.  159 New left lung opacity which could be developing pneumonia: check procalcitonin level. 9/3- coughing with taking pills- asked  nursing/PT to have SLP reassess.   18. Bradycardia: resolved d/ced the nebulizer treatments, likely due to vagal response from treatments. Decrease Toprol XL to every other night 19. Sputum with flecks of blood: monitor while restarting Eliquis, discussed with nursing.  20. Lethargy improved: consulted cardiology given associated bradycardia, Toprol XL changed to HS. Trazodone discontinued 21. HTN: d/c teds 22. Insomnia: change prozac to HS dosing, continue melatonin.  23. History of PVCs off metoprolol: decrease Toprol XL to 12.'5mg'$  every other night      LOS: 13 days A FACE TO FACE EVALUATION WAS PERFORMED  Martha Clan P Eldine Rencher 02/26/2022, 10:53 AM

## 2022-02-26 NOTE — Progress Notes (Signed)
Occupational Therapy Session Note  Patient Details  Name: Linda [name] MRN: [id number] Date of Birth: Sep 27, 1933  Today's Date: 02/26/2022 OT Individual Time: 1130-1200 OT Individual Time Calculation (min): 30 min    Short Term Goals: Week 1:  OT Short Term Goal 1 (Week 1): Pt will complete 1/3 toileting steps with no more than CGA for balance OT Short Term Goal 1 - Progress (Week 1): Not met OT Short Term Goal 2 (Week 1): Pt will complete LB dressing with CGA OT Short Term Goal 2 - Progress (Week 1): Not met OT Short Term Goal 3 (Week 1): Pt will imrpove room air cardiorespiratory endurance for 2 consecutive sessions with SPO2 WNLs OT Short Term Goal 3 - Progress (Week 1): Met Week 2:  OT Short Term Goal 1 (Week 2): STG = LTG 2/2 ELOS  Skilled Therapeutic Interventions/Progress Updates:    Patient was seen this AM early secondary to cancellation, the pt was able to transfer from EOB to the [place]  using a squat pivot transfer, she was able to complete toileting with close S.  The pt was able to donn her LB clothing items inclusive of brief and pajama bottoms with close S.  The pt was able to transfer to w/c LOF to go to the sink area to wash her hands with SBA.   The pt's family came in to visit at the end of the treatment session.  The pt had no c/o pain, her call light and beside table were within reach and all additional needs were addressed.   Therapy Documentation Precautions:  Precautions Precautions: Fall, Other (comment) Precaution Comments: HOH, aphaisa Restrictions Weight Bearing Restrictions: No  Therapy/Group: Individual Therapy  Yvonne Kendall 02/26/2022, 4:18 PM

## 2022-02-27 MED ORDER — METFORMIN HCL 500 MG PO TABS: 500.0000 mg | ORAL_TABLET | Freq: Two times a day (BID) | ORAL | Status: AC

## 2022-02-27 MED ORDER — METOPROLOL SUCCINATE ER 25 MG PO TB24: 12.5000 mg | ORAL_TABLET | ORAL | Status: DC

## 2022-02-27 NOTE — Progress Notes (Signed)
Inpatient Rehabilitation Care Coordinator Discharge Note   Patient Details  Name: Linda [name] MRN: [id number] Date of Birth: 22-Sep-1933   Discharge location: SNF- [facility]  Length of Stay: 14 Days  Discharge activity level: CGA/Min A  Home/community participation:    Patient response UU:[id number] Literacy - How often do you need to have someone help you when you read instructions, pamphlets, or other written material from your doctor or pharmacy?: Often  Patient response YQ:[id number] Isolation - How often do you feel lonely or isolated from those around you?: Never  Services provided included: MD, RD, PT, OT, SLP, RN, CM, TR, Pharmacy, SW  Financial Services:  Financial Services Utilized: Medicare    Choices offered to/list presented to: Patient and children  Follow-up services arranged:              Patient response to transportation need: Is the patient able to respond to transportation needs?: Yes In the past 12 months, has lack of transportation kept you from medical appointments or from getting medications?: No In the past 12 months, has lack of transportation kept you from meetings, work, or from getting things needed for daily living?: No    Comments (or additional information):  Patient/Family verbalized understanding of follow-up arrangements:  Yes  Individual responsible for coordination of the follow-up plan: [name] [phone]  Confirmed correct DME delivered: [name] 02/27/2022    [name]

## 2022-02-27 NOTE — Progress Notes (Addendum)
Report called to Abigail at [facility]. Son received discharge packet. Belongings gathered, pt discharged to private vehicle per wheelchair. No complications noted. Sheela Stack, LPN

## 2022-02-27 NOTE — Progress Notes (Signed)
PROGRESS NOTE   Subjective/Complaints: D/c today Patient's chart reviewed- No issues reported overnight Vitals signs stable except for elevated SBP  ROS: Limited due to aphasia   Objective:   No results found.  No results for input(s): "WBC", "HGB", "HCT", "PLT" in the last 72 hours.  No results for input(s): "NA", "K", "CL", "CO2", "GLUCOSE", "BUN", "CREATININE", "CALCIUM" in the last 72 hours.   Intake/Output Summary (Last 24 hours) at 02/27/2022 1007 Last data filed at 02/27/2022 0730 Gross per 24 hour  Intake 780 ml  Output --  Net 780 ml        Physical Exam: Vital Signs Blood pressure (!) 145/87, pulse 66, temperature 98.4 F (36.9 C), resp. rate 14, height '5\' 3"'$  (1.6 m), weight 73.5 kg, SpO2 94 %.    General: awake, alert, appropriate, sitting up in bed, BMI 28.63 HENT: conjugate gaze; oropharynx moist, very hard of hearing CV: heart rate normal; no JVD Pulmonary: CTAB, no RRW  GI: soft, NT, ND, (+)BS Neurological: aphasic- saying a few words-  Skin: No evidence of breakdown, no evidence of rash Neurologic: Word-finding difficulties. Cranial nerves II through XII intact, motor strength is 5/5 in Left 4+/5 Right deltoid, bicep, tricep, grip, hip flexor, knee extensors, ankle dorsiflexor and plantar flexor Responding to questions better than yesterday Sensory exam Withdraws to pinch Severe aphasia, unable to follow simple commands without gestural cues, per daughter also HOH   Musculoskeletal: no jt deformities No joint swelling  Ambulating with RW CG/MinA Psych: anxious and agitated at times, tearful about situation at times, unmotivated to participate with therapy   Assessment/Plan: 1. Functional deficits which require 3+ hours per day of interdisciplinary therapy in a comprehensive inpatient rehab setting. Physiatrist is providing close team supervision and 24 hour management of active medical  problems listed below. Physiatrist and rehab team continue to assess barriers to discharge/monitor patient progress toward functional and medical goals  Care Tool:  Bathing    Body parts bathed by patient: Right arm, Left arm, Abdomen, Chest, Front perineal area, Buttocks, Right upper leg, Left upper leg, Right lower leg, Left lower leg, Face   Body parts bathed by helper: Right lower leg, Left lower leg     Bathing assist Assist Level: Contact Guard/Touching assist     Upper Body Dressing/Undressing Upper body dressing   What is the patient wearing?: Pull over shirt    Upper body assist Assist Level: Set up assist    Lower Body Dressing/Undressing Lower body dressing      What is the patient wearing?: Incontinence brief, Pants     Lower body assist Assist for lower body dressing: Minimal Assistance - Patient > 75%     Toileting Toileting    Toileting assist Assist for toileting: Contact Guard/Touching assist     Transfers Chair/bed transfer  Transfers assist  Chair/bed transfer activity did not occur: Safety/medical concerns  Chair/bed transfer assist level: Supervision/Verbal cueing     Locomotion Ambulation   Ambulation assist      Assist level: Minimal Assistance - Patient > 75% Assistive device: Walker-rolling Max distance: 50   Walk 10 feet activity   Assist     Assist  level: Minimal Assistance - Patient > 75% Assistive device: Walker-rolling   Walk 50 feet activity   Assist Walk 50 feet with 2 turns activity did not occur: Safety/medical concerns (fatigue, weakness/deconditioning)  Assist level: Minimal Assistance - Patient > 75% Assistive device: Walker-rolling    Walk 150 feet activity   Assist Walk 150 feet activity did not occur: Safety/medical concerns (weakness)         Walk 10 feet on uneven surface  activity   Assist Walk 10 feet on uneven surfaces activity did not occur: Safety/medical concerns (weakness)          Wheelchair     Assist Is the patient using a wheelchair?: Yes Type of Wheelchair: Manual Wheelchair activity did not occur: Safety/medical concerns (fatigue, weakness/deconditioning)  Wheelchair assist level: Minimal Assistance - Patient > 75% Max wheelchair distance: 25    Wheelchair 50 feet with 2 turns activity    Assist    Wheelchair 50 feet with 2 turns activity did not occur: Safety/medical concerns (activity tolerance)       Wheelchair 150 feet activity     Assist  Wheelchair 150 feet activity did not occur: Safety/medical concerns (activity tolerance)       Blood pressure (!) 145/87, pulse 66, temperature 98.4 F (36.9 C), resp. rate 14, height '5\' 3"'$  (1.6 m), weight 73.5 kg, SpO2 94 %.  Medical Problem List and Plan: 1. Functional deficits secondary to left MCA infarct/M3 occlusion status post TNK with revascularization.  ICH right frontal small hematoma likely hemorrhagic transformation status post TNK.Marland KitchenResume Eliquis in 1-2 days.              -patient may  shower             -ELOS/Goals: 02/28/22 minA  Updated daughter  Start B complex with vitamin C tablet  D/c to SNF in her [facility] community today.  2.  Impaired mobility: -DVT/anticoagulation:  Mechanical: Antiembolism stockings, thigh (TED hose) Bilateral lower extremities             -antiplatelet therapy: continue Aspirin 81 mg daily 3. Pain Management: Tylenol as needed 4. Depression: continue Prozac to 20 mg HS, discussed increase with daughter 5. Neuropsych/cognition: This patient is not capable of making decisions on her own behalf. 6. Skin/Wound Care: Routine skin checks 7. Fluids/Electrolytes/Nutrition: Routine in and outs with follow-up chemistries 8.  Atrial fibrillation.  continue Eliquis at 2.'5mg'$  BID. Toprol XL 12.5 restarted HS, continue Cozaar 25 mg daily.  Cardiac rate controlled 9.  History of severe mitral regurgitation status post MitraClip 11/2020 10.  Diastolic  congestive heart failure.  Lasix 20 mg daily.  Monitor for any signs of fluid overload -Messaged nursing to check weight today Filed Weights   02/20/22 0322 02/21/22 0357 02/25/22 1700  Weight: 72.5 kg 73.3 kg 73.5 kg    11.  Diabetes mellitus.  Hemoglobin A1c 6.6.  SSI.  Patient on Farxiga 5 mg daily prior to admission.  Resume as needed  Increase metformin to '500mg'$  BID 12.  Hyperlipidemia.  LDL reviewed and is 77. Is on Zetia 13.  GERD.  Protonix 14.  Epistaxis.  Packed by Dr. Hope Budds while at [facility] 15. Suboptimal vitamin D: start 1,000U daily 16. Hypokalemia: increase daily potassium supplement to 47mq daily, repeat potassium on Monday. Changed medication to kdur.  164 New left lung opacity which could be developing pneumonia: check procalcitonin level. 9/3- coughing with taking pills- asked nursing/PT to have SLP reassess.   18. Bradycardia: resolved d/ced  the nebulizer treatments, likely due to vagal response from treatments. Decrease Toprol XL to every other night 19. Sputum with flecks of blood: monitor while restarting Eliquis, discussed with nursing.  20. Lethargy improved: consulted cardiology given associated bradycardia, Toprol XL changed to HS. Trazodone discontinued 21. HTN: d/c teds 22. Insomnia: change prozac to HS dosing, continue melatonin.  23. History of PVCs off metoprolol: decrease Toprol XL to 12.'5mg'$  every other night      LOS: 14 days A FACE TO FACE EVALUATION WAS PERFORMED  Carmen Tolliver P Dwan Hemmelgarn 02/27/2022, 10:07 AM

## 2022-02-28 NOTE — Progress Notes (Signed)
Inpatient Rehabilitation Discharge Medication Review by a Pharmacist  A complete drug regimen review was completed for this patient to identify any potential clinically significant medication issues.  High Risk Drug Classes Is patient taking? Indication by Medication  Antipsychotic No   Anticoagulant Yes Apixaban for stroke ppx  Antibiotic No   Opioid No   Antiplatelet No   Hypoglycemics/insulin No   Vasoactive Medication Yes Metoprolol for Afib and HTN Losartan for HTN   Chemotherapy No   Other Yes Pantoprazole for GERD Fluoxetine for mood Ezetimibe for HLD  Metformin for DM2 Furosemide for HFrEF     Type of Medication Issue Identified Description of Issue Recommendation(s)  Drug Interaction(s) (clinically significant)     Duplicate Therapy     Allergy     No Medication Administration End Date     Incorrect Dose     Additional Drug Therapy Needed     Significant med changes from prior encounter (inform family/care partners about these prior to discharge). New Metformin, fluoxetine, melatonin, magnesium, Vit B complex, Vit D, docusate, saline nasal gel  Stop aspirin given started apixaban and hx of epistaxis  Stop trazodone  Patient educated prior to discharge   Other       Clinically significant medication issues were identified that warrant physician communication and completion of prescribed/recommended actions by midnight of the next day:  No  Name of provider notified for urgent issues identified:   Provider Method of Notification:     Pharmacist comments: Clarified with Linna Hoff Angiulli to stop aspirin per reasons above  Time spent performing this drug regimen review (minutes):  15 min   Benetta Spar, PharmD, BCPS, [facility] Clinical Pharmacist  Please check AMION for all [place] phone numbers After 10:00 PM, call [place]

## 2022-05-22 ENCOUNTER — Other Ambulatory Visit: Payer: Self-pay

## 2022-05-22 NOTE — Patient Outreach (Signed)
First telephone outreach attempt to obtain mRS. No answer as phone just rang. No V/m unable to leave message for returned call.  Philmore Pali [facility] Management Assistant [phone]

## 2022-05-24 ENCOUNTER — Other Ambulatory Visit: Payer: Self-pay

## 2022-05-24 NOTE — Patient Outreach (Signed)
Second  telephone outreach attempt to obtain mRS. No answer as phone just rang. No V/m unable to leave message for returned call.   Philmore Pali [facility] Management Assistant [phone]

## 2022-05-29 ENCOUNTER — Other Ambulatory Visit: Payer: Self-pay

## 2022-05-29 NOTE — Patient Outreach (Signed)
3 outreach attempts were completed to obtain mRs. mRs could not be obtained because patient never returned my calls. mRs=7    Linda [name] Care Management Assistant [phone]  

## 2022-06-21 ENCOUNTER — Emergency Department (HOSPITAL_COMMUNITY): Payer: Medicare Other

## 2022-06-21 ENCOUNTER — Inpatient Hospital Stay (HOSPITAL_COMMUNITY)
Admission: EM | Admit: 2022-06-21 | Discharge: 2022-06-27 | DRG: 056 | Disposition: A | Discharge status: Skilled Nursing Facility | Payer: Medicare Other | Source: Skilled Nursing Facility | Attending: Family Medicine | Admitting: Family Medicine

## 2022-06-21 ENCOUNTER — Other Ambulatory Visit: Payer: Self-pay

## 2022-06-21 DIAGNOSIS — G40101 Localization-related (focal) (partial) symptomatic epilepsy and epileptic syndromes with simple partial seizures, not intractable, with status epilepticus: Secondary | ICD-10-CM | POA: Diagnosis present

## 2022-06-21 DIAGNOSIS — Z85828 Personal history of other malignant neoplasm of skin: Secondary | ICD-10-CM

## 2022-06-21 DIAGNOSIS — E119 Type 2 diabetes mellitus without complications: Secondary | ICD-10-CM | POA: Diagnosis present

## 2022-06-21 DIAGNOSIS — I110 Hypertensive heart disease with heart failure: Secondary | ICD-10-CM | POA: Diagnosis present

## 2022-06-21 DIAGNOSIS — N179 Acute kidney failure, unspecified: Secondary | ICD-10-CM | POA: Diagnosis present

## 2022-06-21 DIAGNOSIS — G40901 Epilepsy, unspecified, not intractable, with status epilepticus: Secondary | ICD-10-CM | POA: Diagnosis not present

## 2022-06-21 DIAGNOSIS — J9602 Acute respiratory failure with hypercapnia: Secondary | ICD-10-CM | POA: Diagnosis present

## 2022-06-21 DIAGNOSIS — F419 Anxiety disorder, unspecified: Secondary | ICD-10-CM | POA: Diagnosis present

## 2022-06-21 DIAGNOSIS — H9190 Unspecified hearing loss, unspecified ear: Secondary | ICD-10-CM | POA: Diagnosis present

## 2022-06-21 DIAGNOSIS — Z79899 Other long term (current) drug therapy: Secondary | ICD-10-CM

## 2022-06-21 DIAGNOSIS — E785 Hyperlipidemia, unspecified: Secondary | ICD-10-CM | POA: Diagnosis present

## 2022-06-21 DIAGNOSIS — G40001 Localization-related (focal) (partial) idiopathic epilepsy and epileptic syndromes with seizures of localized onset, not intractable, with status epilepticus: Secondary | ICD-10-CM

## 2022-06-21 DIAGNOSIS — E892 Postprocedural hypoparathyroidism: Secondary | ICD-10-CM | POA: Diagnosis present

## 2022-06-21 DIAGNOSIS — N390 Urinary tract infection, site not specified: Secondary | ICD-10-CM | POA: Diagnosis present

## 2022-06-21 DIAGNOSIS — K219 Gastro-esophageal reflux disease without esophagitis: Secondary | ICD-10-CM | POA: Diagnosis present

## 2022-06-21 DIAGNOSIS — Z880 Allergy status to penicillin: Secondary | ICD-10-CM

## 2022-06-21 DIAGNOSIS — I69398 Other sequelae of cerebral infarction: Principal | ICD-10-CM

## 2022-06-21 DIAGNOSIS — Z882 Allergy status to sulfonamides status: Secondary | ICD-10-CM

## 2022-06-21 DIAGNOSIS — Z823 Family history of stroke: Secondary | ICD-10-CM

## 2022-06-21 DIAGNOSIS — Z8249 Family history of ischemic heart disease and other diseases of the circulatory system: Secondary | ICD-10-CM

## 2022-06-21 DIAGNOSIS — I480 Paroxysmal atrial fibrillation: Secondary | ICD-10-CM | POA: Diagnosis present

## 2022-06-21 DIAGNOSIS — R569 Unspecified convulsions: Secondary | ICD-10-CM

## 2022-06-21 DIAGNOSIS — I69320 Aphasia following cerebral infarction: Secondary | ICD-10-CM

## 2022-06-21 DIAGNOSIS — Z7984 Long term (current) use of oral hypoglycemic drugs: Secondary | ICD-10-CM

## 2022-06-21 DIAGNOSIS — Z7901 Long term (current) use of anticoagulants: Secondary | ICD-10-CM

## 2022-06-21 DIAGNOSIS — R29729 NIHSS score 29: Secondary | ICD-10-CM | POA: Diagnosis present

## 2022-06-21 DIAGNOSIS — B9620 Unspecified Escherichia coli [E. coli] as the cause of diseases classified elsewhere: Secondary | ICD-10-CM | POA: Diagnosis present

## 2022-06-21 DIAGNOSIS — Z66 Do not resuscitate: Secondary | ICD-10-CM | POA: Diagnosis present

## 2022-06-21 DIAGNOSIS — I482 Chronic atrial fibrillation, unspecified: Secondary | ICD-10-CM | POA: Diagnosis present

## 2022-06-21 DIAGNOSIS — G8191 Hemiplegia, unspecified affecting right dominant side: Secondary | ICD-10-CM | POA: Diagnosis present

## 2022-06-21 DIAGNOSIS — I5033 Acute on chronic diastolic (congestive) heart failure: Secondary | ICD-10-CM | POA: Diagnosis present

## 2022-06-21 DIAGNOSIS — R400 Somnolence: Secondary | ICD-10-CM

## 2022-06-21 DIAGNOSIS — Z808 Family history of malignant neoplasm of other organs or systems: Secondary | ICD-10-CM

## 2022-06-21 DIAGNOSIS — Z888 Allergy status to other drugs, medicaments and biological substances status: Secondary | ICD-10-CM

## 2022-06-21 DIAGNOSIS — R471 Dysarthria and anarthria: Secondary | ICD-10-CM | POA: Diagnosis present

## 2022-06-21 DIAGNOSIS — R2990 Unspecified symptoms and signs involving the nervous system: Secondary | ICD-10-CM

## 2022-06-21 DIAGNOSIS — Z974 Presence of external hearing-aid: Secondary | ICD-10-CM

## 2022-06-21 DIAGNOSIS — E876 Hypokalemia: Secondary | ICD-10-CM | POA: Diagnosis present

## 2022-06-21 DIAGNOSIS — I272 Pulmonary hypertension, unspecified: Secondary | ICD-10-CM | POA: Diagnosis present

## 2022-06-21 LAB — DIFFERENTIAL
Abs Immature Granulocytes: 0.01 10*3/uL (ref 0.00–0.07)
Basophils Absolute: 0.1 10*3/uL (ref 0.0–0.1)
Basophils Relative: 1 %
Eosinophils Absolute: 0.1 10*3/uL (ref 0.0–0.5)
Eosinophils Relative: 2 %
Immature Granulocytes: 0 %
Lymphocytes Relative: 21 %
Lymphs Abs: 1.6 10*3/uL (ref 0.7–4.0)
Monocytes Absolute: 0.9 10*3/uL (ref 0.1–1.0)
Monocytes Relative: 12 %
Neutro Abs: 5 10*3/uL (ref 1.7–7.7)
Neutrophils Relative %: 64 %

## 2022-06-21 LAB — I-STAT CHEM 8, ED
BUN: 20 mg/dL (ref 8–23)
Calcium, Ion: 0.98 mmol/L — ABNORMAL LOW (ref 1.15–1.40)
Chloride: 103 mmol/L (ref 98–111)
Creatinine, Ser: 0.8 mg/dL (ref 0.44–1.00)
Glucose, Bld: 171 mg/dL — ABNORMAL HIGH (ref 70–99)
HCT: 46 % (ref 36.0–46.0)
Hemoglobin: 15.6 g/dL — ABNORMAL HIGH (ref 12.0–15.0)
Potassium: 4.4 mmol/L (ref 3.5–5.1)
Sodium: 138 mmol/L (ref 135–145)
TCO2: 26 mmol/L (ref 22–32)

## 2022-06-21 LAB — I-STAT ARTERIAL BLOOD GAS, ED
Acid-Base Excess: 1 mmol/L (ref 0.0–2.0)
Bicarbonate: 29.1 mmol/L — ABNORMAL HIGH (ref 20.0–28.0)
Calcium, Ion: 1.26 mmol/L (ref 1.15–1.40)
HCT: 41 % (ref 36.0–46.0)
Hemoglobin: 13.9 g/dL (ref 12.0–15.0)
O2 Saturation: 100 %
Potassium: 3.9 mmol/L (ref 3.5–5.1)
Sodium: 139 mmol/L (ref 135–145)
TCO2: 31 mmol/L (ref 22–32)
pCO2 arterial: 62.1 mmHg — ABNORMAL HIGH (ref 32–48)
pH, Arterial: 7.279 — ABNORMAL LOW (ref 7.35–7.45)
pO2, Arterial: 247 mmHg — ABNORMAL HIGH (ref 83–108)

## 2022-06-21 LAB — CBG MONITORING, ED: Glucose-Capillary: 181 mg/dL — ABNORMAL HIGH (ref 70–99)

## 2022-06-21 LAB — COMPREHENSIVE METABOLIC PANEL
ALT: 25 U/L (ref 0–44)
AST: 36 U/L (ref 15–41)
Albumin: 4 g/dL (ref 3.5–5.0)
Alkaline Phosphatase: 78 U/L (ref 38–126)
Anion gap: 13 (ref 5–15)
BUN: 15 mg/dL (ref 8–23)
CO2: 22 mmol/L (ref 22–32)
Calcium: 9.3 mg/dL (ref 8.9–10.3)
Chloride: 100 mmol/L (ref 98–111)
Creatinine, Ser: 1.05 mg/dL — ABNORMAL HIGH (ref 0.44–1.00)
GFR, Estimated: 52 mL/min — ABNORMAL LOW (ref >60)
Glucose, Bld: 172 mg/dL — ABNORMAL HIGH (ref 70–99)
Potassium: 4.4 mmol/L (ref 3.5–5.1)
Sodium: 135 mmol/L (ref 135–145)
Total Bilirubin: 1.5 mg/dL — ABNORMAL HIGH (ref 0.3–1.2)
Total Protein: 7 g/dL (ref 6.5–8.1)

## 2022-06-21 LAB — PROTIME-INR
INR: 1.5 — ABNORMAL HIGH (ref 0.8–1.2)
Prothrombin Time: 17.6 seconds — ABNORMAL HIGH (ref 11.4–15.2)

## 2022-06-21 LAB — I-STAT VENOUS BLOOD GAS, ED
Acid-base deficit: 9 mmol/L — ABNORMAL HIGH (ref 0.0–2.0)
Bicarbonate: 15.9 mmol/L — ABNORMAL LOW (ref 20.0–28.0)
Calcium, Ion: 0.83 mmol/L — CL (ref 1.15–1.40)
HCT: 23 % — ABNORMAL LOW (ref 36.0–46.0)
Hemoglobin: 7.8 g/dL — ABNORMAL LOW (ref 12.0–15.0)
O2 Saturation: 75 %
Potassium: 2.2 mmol/L — CL (ref 3.5–5.1)
Sodium: 147 mmol/L — ABNORMAL HIGH (ref 135–145)
TCO2: 17 mmol/L — ABNORMAL LOW (ref 22–32)
pCO2, Ven: 31.7 mmHg — ABNORMAL LOW (ref 44–60)
pH, Ven: 7.309 (ref 7.25–7.43)
pO2, Ven: 43 mmHg (ref 32–45)

## 2022-06-21 LAB — CBC
HCT: 45.3 % (ref 36.0–46.0)
Hemoglobin: 14.8 g/dL (ref 12.0–15.0)
MCH: 31.8 pg (ref 26.0–34.0)
MCHC: 32.7 g/dL (ref 30.0–36.0)
MCV: 97.4 fL (ref 80.0–100.0)
Platelets: 217 10*3/uL (ref 150–400)
RBC: 4.65 MIL/uL (ref 3.87–5.11)
RDW: 14 % (ref 11.5–15.5)
WBC: 7.8 10*3/uL (ref 4.0–10.5)
nRBC: 0 % (ref 0.0–0.2)

## 2022-06-21 LAB — ETHANOL: Alcohol, Ethyl (B): <10 mg/dL (ref <10)

## 2022-06-21 LAB — APTT: aPTT: 33 seconds (ref 24–36)

## 2022-06-21 MED ORDER — LORAZEPAM 2 MG/ML IJ SOLN
INTRAMUSCULAR | Status: AC
  Administered 2022-06-21: 2 mg via INTRAVENOUS
  Filled 2022-06-21: qty 2

## 2022-06-21 MED ORDER — SODIUM CHLORIDE 0.9 % IV SOLN
3000.0000 mg | Freq: Once | INTRAVENOUS | Status: DC
  Administered 2022-06-21: 3000 mg via INTRAVENOUS

## 2022-06-21 MED ORDER — LORAZEPAM 2 MG/ML IJ SOLN: 2.0000 mg | Freq: Once | INTRAMUSCULAR | Status: AC

## 2022-06-21 MED ORDER — LEVETIRACETAM IN NACL 1500 MG/100ML IV SOLN: 1500.0000 mg | Freq: Once | INTRAVENOUS | Status: DC

## 2022-06-21 MED ORDER — LEVETIRACETAM IN NACL 1000 MG/100ML IV SOLN
1000.0000 mg | Freq: Two times a day (BID) | INTRAVENOUS | Status: DC
  Administered 2022-06-22: 1000 mg via INTRAVENOUS
  Filled 2022-06-21: qty 100

## 2022-06-21 NOTE — ED Notes (Signed)
Provider notified of painaid score at this time

## 2022-06-21 NOTE — Consult Note (Signed)
Neurology Consultation Reason for Consult: Code Stroke Requesting Physician: Kathrynn Humble  CC: Right gaze, right sided weakness  History is obtained from:EMS  HPI: Linda [name] is a 89 y.o. female with a PMHx of DM2, HTN, HLD, Afib on eliquis presenting with a right-sided gaze and right-sided weakness as well as right-sided twitching.  She currently resides at [facility] in [place] and was found by staff with a right-sided gaze, right-sided weakness, and unresponsive.  According to documentation obtained from the nursing home she is currently taking all of her medications including Eliquis.  She was last seen normal at 1530 by staff. Per EMS she did have 1 episode of emesis and received 4 mg of Zofran prior to her arrival to the ED.  Upon arrival to the bridge blood pressure is 155/96, she is in A-fib, and glucose is 181.    She was seen in August 2023 for a left M3 occlusion status post TNK and mechanical thrombectomy.  She did have a small right frontal hematoma that was likely hemorrhagic transformation from the TNK.    No prior history of seizures documented  LKW: 1530 Thrombolytic given?: No, on Eliquis IA performed?: No Premorbid modified rankin scale:      4 - Moderately severe disability. Unable to attend to own bodily needs without assistance, and unable to walk unassisted.   ROS: All other review of systems was negative except as noted in the HPI.   Past Medical History:  Diagnosis Date   Anxiety    Arrhythmia    paroxysmal atrial fibrillation   CHF (congestive heart failure) (HCC)    Colon polyp    Diverticulitis    Dysrhythmia    GERD (gastroesophageal reflux disease)    Hard of hearing    Hoarseness    Hyperlipidemia    Hypertension    Kidney stone    Motion sickness    Parathyroid disease (HCC)    Parathyroidectomy    PONV (postoperative nausea and vomiting)    Pre-diabetes    PVC (premature ventricular contraction)    Skin cancer    Squamous  cell carcinoma of skin 07/19/2021   right lower pretibia lateral, EDC   Squamous cell carcinoma of skin 10/13/2014   R ant neck - KA pattern   Squamous cell carcinoma of skin 09/15/2021   Left dorsal hand, shaved down to fat at time of biopsy so should already be removed.  Will observe for recurrence.   Stroke [facility])    TIA (transient ischemic attack) 04/2018   no deficitis   UTI (lower urinary tract infection)    Varicose veins of both lower extremities    Wears hearing aid in both ears     Family History  Problem Relation Age of Onset   Heart disease Brother    Stroke Mother    Skin cancer Father    Breast cancer Paternal Grandmother     Social History:  reports that she has never smoked. She has never used smokeless tobacco. She reports that she does not drink alcohol and does not use drugs.  Exam: Current vital signs: There were no vitals taken for this visit. Vital signs in last 24 hours: BP: ()/()  Arterial Line BP: ()/()    Physical Exam  Constitutional: Appears well-developed and well-nourished.  Psych: Minimally interactive Eyes: No scleral injection HENT: No oropharyngeal obstruction.  MSK: no joint deformities.  Cardiovascular: Regular rate, irregular rhythm perfusing extremities well Respiratory: Effort normal, non-labored breathing GI: Soft.  No distension. There is no tenderness.  Skin: Warm dry and intact visible skin  Neuro: Mental Status: Unresponsive, requires forced eye opening, is not following commands, no speech output Cranial Nerves: II: Right hemianopsia, pupils are equal III,IV, VI: Gaze is fixed downward and to the right initially but did improve to midline after loading with Keppra and 2 mg of Ativan VII: Right facial droop VIII: Does not respond to verbal stimuli  X: Cough and gag are weak XI: Head is deviated to the right initially but more midline after treatment for seizures Motor: Tone is normal. Bulk is normal. RUE 0/5 LUE  4/5 RLE 0/5 LLE 4/5 Initially she had some twitching involving predominantly the right hand but at times involving the right foot She does have some spontaneous movement of the right lower extremity after treatment for focal seizure but the right upper extremity remains plegic Sensory: Withdraws to pain in all extremities, more brisk on the left side than the right Cerebellar: Unable to complete Gait:  Deferred in acute setting   NIHSS total  1a Level of Conscious.: 0 1b LOC Questions: 2 1c LOC Commands: 2 2 Best Gaze: 2 3 Visual: 2 4 Facial Palsy: 2 5a Motor Arm - left: 3 5b Motor Arm - Right: 4  6a Motor Leg - Left: 1 6b Motor Leg - Right: 4 7 Limb Ataxia: 0 8 Sensory: 1 9 Best Language: 3 10 Dysarthria: 2 11 Extinct. and Inatten.: 0 TOTAL: 29  Score breakdown:  Performed at 1636 time of patient arrival to ED    I have reviewed labs in epic and the results pertinent to this consultation are: BMP and CBC are unremarkable PT 17.6 INR 1.5  I have reviewed the images obtained:  Head CT personally reviewed, agree with radiology:   No acute intracranial process. ASPECTS is 10.   EKG with atrial fibrillation, QTc approximately 450, no significant heart block  Impression: 89 y.o. female with a PMHx of DM2, HTN, HLD, Afib on eliquis presenting with a right-sided gaze and right-sided weakness as well as right-sided twitching.  She currently resides at [facility] in [place] and was found by staff with a right-sided gaze, right-sided weakness, right-sided twitching, and unresponsive.  Gaze deviation and twitching improved with 2 mg of Ativan and 3000 mg of Keppra.  She did require a nonrebreather to maintain her oxygen saturations after these medications.  Her son, Roderic Palau, family would consent to intubation in the situation where it was thought to be temporary.  Stat EEG has been ordered.  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 - s/p 3000 mg IV may give up to 1500 additional for full loading does of 4500 mg Keppra IV if needed for seizure control - Magnesium, UA, UDS, chest x-ray to look for other potentially triggering factors - Vimpat could be considered as a second agent if needed - 2 mg Ativan up to 4 doses for total of 8 mg, excerising caution given focal status and DNR code status  - STAT EEG, LTM to monitor seizure burden - Keppra 1000 mg twice daily for now, may be adjusted based on renal function going forward -  May need critical care versus hospitalist admission pending brief observation in the ED to see how her mental status is recovering postictally and in the setting of sedating antiseizure medications - As noted above, intubation is within goals of care for this patient if needed temporarily for seizure control - Neurology will continue to follow along  [place] [phone] Available 7 AM to 7 PM, outside these hours please contact Neurologist on call listed on AMION   Total critical care time: 45 minutes   Critical care time was exclusive of separately billable procedures and treating other patients.   Critical care was necessary to treat or prevent imminent or life-threatening deterioration, status epilepticus management   Critical care was time spent personally by me on the following activities: development of treatment plan with patient and/or surrogate as well as nursing, discussions with consultants/primary team, evaluation of patient's response to treatment, examination of patient, obtaining history from patient or surrogate,  ordering and performing treatments and interventions, ordering and review of laboratory studies, ordering and review of radiographic studies, and re-evaluation of patient's condition as needed, as documented above.

## 2022-06-21 NOTE — ED Notes (Signed)
Eeg at bedside

## 2022-06-21 NOTE — Progress Notes (Signed)
LTM EEG hooked up and running - no initial skin breakdown - push button tested -Not monitored by [facility] due to being in the ER.

## 2022-06-21 NOTE — ED Notes (Signed)
NEUROLOGIST AT BEDSIDE AT THIS TIME TO DO EVALUATION. REPORTS THAT THEY ARE TRYING TO GET IN TOUCH WITH FAMILY

## 2022-06-21 NOTE — ED Notes (Signed)
Eeg tech reports she will be coming by soon

## 2022-06-21 NOTE — ED Notes (Signed)
Suction provided at this time patient having difficulty managing secretions patient sitting up In the bed and repositioned

## 2022-06-21 NOTE — ED Notes (Signed)
Margaretha Sheffield RN, BUGHAT MD, [place] ARRIVAL OF CODE STROKE

## 2022-06-21 NOTE — Code Documentation (Signed)
Linda [name] is a 89 yr old female with a PMH of prior CVA with year. She is on Eliquis. She is from a SNF where she was last known well (baseline aphasic, walks some with rollator) at 1530. Shortly after, staff found her with Rt gaze, rt sided weakness. EMS activated code stroke alert.    Pt met at bridge by stroke team. Blood, CBG drawn. Airway cleared by EDP. Pt to CT with team. NIHSS 26. (Please see documentation for timeline and details.) Pt with Rt gaze, rt weakness, mute, not f/c. Ativan 2 mg given in CT scanner. 3000 Keppra load started in CT. CTNC performed.. During topogram, Pt starting to desat. Staff entered room and applied NRB mask. Sats back up to 90s. CT neg for acute hemorrhage per Dr. Curly Sh[place].     Pt taken to room 17 where her workup will continue. She will need at least Q 2 hr VS and NIHSS. Family contacted by provider to establish limits of care (was previously DRN). Bedside handoff with Levonne Spiller. Pt ineligible for TNK due to Eliquis. Pt not NIR candidate as low suspicion LVO, presentation more consistent with seizure.

## 2022-06-21 NOTE — ED Notes (Signed)
Purewick placed at this time patient repositioned and family at bedside verbalizes understanding of the plan of care at this time

## 2022-06-21 NOTE — ED Notes (Signed)
Providers at bedside acknowledge the patient's snoring RR

## 2022-06-21 NOTE — ED Notes (Addendum)
Patient dressed out into hospital gown at this time and repositioned. Belongings placed in one bag

## 2022-06-21 NOTE — ED Notes (Signed)
ER AND NEURO PROVIDER AT BEDSIDE AT THIS TIME TO EXAMINE PATIENT

## 2022-06-21 NOTE — ED Provider Notes (Signed)
[facility] EMERGENCY DEPARTMENT Provider Note   CSN: [id number] Arrival date & time: 06/21/22  1636  An emergency department physician performed an initial assessment on this suspected stroke patient at 1638.  History  Chief Complaint  Patient presents with   Cerebrovascular Accident    Linda [name] is a 89 y.o. female.  HPI     89 year old female comes in with chief complaint of code stroke.  Patient has history of A-fib on Eliquis, valvular disorder, CHF, diabetes and had a stroke in August that required thrombectomy and patient had a small hemorrhagic conversion thereafter.  Patient currently resides at nursing home.  Level 5 caveat for altered mental status.  Patient presents to the ER with strokelike symptoms.  Patient was found unresponsive with right-sided gaze and right-sided weakness by nursing home staff.  She also had an episode of emesis that was witnessed by EMS, and she received Zofran for it.  Patient was noted to have right-sided tremor.  Neurology team assessed the patient immediately upon her arrival.  I spoke with patient's daughter.  She states that patient was doing well yesterday when she talked with her over the phone and the day before when she visited her.  Patient has not had any setbacks since she was admitted to the rehab facility.  She still has some aphasia from her stroke and undergoing speech therapy.  Home Medications Prior to Admission medications   Medication Sig Start Date End Date Taking? Authorizing Provider  alum & mag hydroxide-simeth (MAALOX PLUS) 400-400-40 MG/5ML suspension Take 30 mLs by mouth every 4 (four) hours as needed for indigestion.   Yes [provider]  apixaban (ELIQUIS) 5 MG TABS tablet Take 1 tablet (5 mg total) by mouth 2 (two) times daily. 02/24/22  Yes Angiulli, Lavon Paganini, PA-C  B Complex-C (B-COMPLEX WITH VITAMIN C) tablet Take 1 tablet by mouth daily. 02/24/22  Yes Angiulli, Lavon Paganini, PA-C   cholecalciferol (CHOLECALCIFEROL) 25 MCG tablet Take 1 tablet (1,000 Units total) by mouth daily. 02/24/22  Yes Angiulli, Lavon Paganini, PA-C  docusate sodium (COLACE) 100 MG capsule Take 1 capsule (100 mg total) by mouth 2 (two) times daily. 02/13/22  Yes de Yolanda Manges, Cortney E, NP  ezetimibe (ZETIA) 10 MG tablet Take 1 tablet (10 mg total) by mouth daily. 02/14/22  Yes de Yolanda Manges, Cortney E, NP  FLUoxetine (PROZAC) 20 MG capsule Take 1 capsule (20 mg total) by mouth at bedtime. Patient taking differently: Take 20 mg by mouth daily. 02/24/22  Yes Angiulli, Lavon Paganini, PA-C  furosemide (LASIX) 20 MG tablet Take 1 tablet (20 mg total) by mouth daily. 02/24/22  Yes Angiulli, Lavon Paganini, PA-C  loratadine (CLARITIN) 10 MG tablet Take 1 tablet (10 mg total) by mouth daily. 02/24/22  Yes Angiulli, Lavon Paganini, PA-C  losartan (COZAAR) 100 MG tablet Take 100 mg by mouth daily.   Yes [provider]  magnesium gluconate (MAGONATE) 500 MG tablet Take 0.5 tablets (250 mg total) by mouth at bedtime. 02/24/22  Yes Angiulli, Lavon Paganini, PA-C  melatonin 3 MG TABS tablet Take 1 tablet (3 mg total) by mouth at bedtime. 02/24/22  Yes Angiulli, Lavon Paganini, PA-C  metFORMIN (GLUCOPHAGE) 500 MG tablet Take 1 tablet (500 mg total) by mouth 2 (two) times daily with a meal. 02/27/22  Yes Angiulli, Lavon Paganini, PA-C  pantoprazole (PROTONIX) 40 MG tablet Take 1 tablet (40 mg total) by mouth daily at 12 noon. Patient taking differently: Take 40  mg by mouth daily. 02/24/22  Yes Angiulli, Lavon Paganini, PA-C  potassium chloride SA (KLOR-CON M) 20 MEQ tablet Take 1 tablet (20 mEq total) by mouth daily. 02/24/22  Yes Angiulli, Lavon Paganini, PA-C  acetaminophen (TYLENOL) 325 MG tablet Take 2 tablets (650 mg total) by mouth every 4 (four) hours as needed for mild pain (or temp > 37.5 C (99.5 F)). Patient not taking: Reported on 06/21/2022 02/24/22   Angiulli, Lavon Paganini, PA-C  metoprolol succinate (TOPROL-XL) 25 MG 24 hr tablet Take 0.5 tablets (12.5 mg total) by mouth  every other day. Patient not taking: Reported on 06/21/2022 02/27/22   Angiulli, Lavon Paganini, PA-C  saline (AYR) GEL Place 1 Application into both nostrils every 6 (six) hours. Patient not taking: Reported on 06/21/2022 02/06/22   Loletha Grayer, MD      Allergies    Barium iodide, Lipitor [atorvastatin], Statins, Crestor [rosuvastatin calcium], Penicillins, Sulfa antibiotics, and Sulfonamide derivatives    Review of Systems   Review of Systems  Physical Exam Updated Vital Signs BP 126/80   Pulse 76   Temp 97.8 F (36.6 C) (Axillary)   Resp (!) 23   SpO2 98%  Physical Exam Vitals and nursing note reviewed.  Constitutional:      Appearance: She is well-developed.  HENT:     Head: Atraumatic.  Eyes:     Comments: Intermittent right-sided gaze abnormality  Cardiovascular:     Rate and Rhythm: Normal rate.  Pulmonary:     Effort: Pulmonary effort is normal.     Comments: tachypnea Musculoskeletal:     Cervical back: Normal range of motion and neck supple.  Skin:    General: Skin is warm and dry.  Neurological:     Comments: Upgoing Babinski, response to noxious stimuli by localizing it     ED Results / Procedures / Treatments   Labs (all labs ordered are listed, but only abnormal results are displayed) Labs Reviewed  PROTIME-INR - Abnormal; Notable for the following components:      Result Value   Prothrombin Time 17.6 (*)    INR 1.5 (*)    All other components within normal limits  COMPREHENSIVE METABOLIC PANEL - Abnormal; Notable for the following components:   Glucose, Bld 172 (*)    Creatinine, Ser 1.05 (*)    Total Bilirubin 1.5 (*)    GFR, Estimated 52 (*)    All other components within normal limits  CBG MONITORING, ED - Abnormal; Notable for the following components:   Glucose-Capillary 181 (*)    All other components within normal limits  I-STAT CHEM 8, ED - Abnormal; Notable for the following components:   Glucose, Bld 171 (*)    Calcium, Ion 0.98 (*)     Hemoglobin 15.6 (*)    All other components within normal limits  I-STAT ARTERIAL BLOOD GAS, ED - Abnormal; Notable for the following components:   pH, Arterial 7.279 (*)    pCO2 arterial 62.1 (*)    pO2, Arterial 247 (*)    Bicarbonate 29.1 (*)    All other components within normal limits  I-STAT VENOUS BLOOD GAS, ED - Abnormal; Notable for the following components:   pCO2, Ven 31.7 (*)    Bicarbonate 15.9 (*)    TCO2 17 (*)    Acid-base deficit 9.0 (*)    Sodium 147 (*)    Potassium 2.2 (*)    Calcium, Ion 0.83 (*)    HCT 23.0 (*)    Hemoglobin  7.8 (*)    All other components within normal limits  ETHANOL  APTT  CBC  DIFFERENTIAL  RAPID URINE DRUG SCREEN, HOSP PERFORMED  URINALYSIS, ROUTINE W REFLEX MICROSCOPIC  MAGNESIUM    EKG None  Radiology DG CHEST PORT 1 VIEW  Result Date: 06/21/2022 CLINICAL DATA:  Altered mental status. EXAM: PORTABLE CHEST 1 VIEW COMPARISON:  Radiograph 02/13/2022 FINDINGS: Chronic elevation of right hemidiaphragm. Unchanged cardiomegaly. Stable clips projecting over the expected mitral valve location. Stable mediastinal contours with aortic atherosclerosis. Vascular congestion with question of septal thickening/pulmonary edema. No large pleural effusion or confluent airspace disease. No pneumothorax. Stable osseous structures. IMPRESSION: Cardiomegaly with vascular congestion and question of septal thickening/pulmonary edema. Electronically Signed   By: Keith Rake M.D.   On: 06/21/2022 20:45   CT HEAD CODE STROKE WO CONTRAST  Result Date: 06/21/2022 CLINICAL DATA:  Code stroke. EXAM: CT HEAD WITHOUT CONTRAST TECHNIQUE: Contiguous axial images were obtained from the base of the skull through the vertex without intravenous contrast. RADIATION DOSE REDUCTION: This exam was performed according to the departmental dose-optimization program which includes automated exposure control, adjustment of the mA and/or kV according to patient size and/or use of  iterative reconstruction technique. COMPARISON:  02/15/2022 FINDINGS: Brain: No evidence of acute infarction, hemorrhage, cerebral edema, mass, mass effect, or midline shift. No hydrocephalus or extra-axial collection. Encephalomalacia in the posterior left MCA territory, consistent with expected evolution of the previously noted subacute infarct in this area. Remote left cerebellar infarct. Vascular: No hyperdense vessel. Skull: Negative for fracture or focal lesion. Sinuses/Orbits: Small mucous retention cysts in the right maxillary sinus. Status post bilateral lens replacements. Other: The mastoid air cells are well aerated. ASPECTS [facility] Stroke Program Early CT Score) - Ganglionic level infarction (caudate, lentiform nuclei, internal capsule, insula, M1-M3 cortex): 7 - Supraganglionic infarction (M4-M6 cortex): 3 Total score (0-10 with 10 being normal): 10 IMPRESSION: No acute intracranial process. ASPECTS is 10. Code stroke imaging results were communicated on 06/21/2022 at 5:14 pm to provider BHAGAT via secure text paging. Electronically Signed   By: Merilyn Baba M.D.   On: 06/21/2022 17:15    Procedures .Critical Care  Performed by: Varney Biles, MD Authorized by: Varney Biles, MD   Critical care provider statement:    Critical care time (minutes):  128   Critical care was necessary to treat or prevent imminent or life-threatening deterioration of the following conditions:  CNS failure or compromise   Critical care was time spent personally by me on the following activities:  Development of treatment plan with patient or surrogate, discussions with consultants, evaluation of patient's response to treatment, examination of patient, ordering and review of laboratory studies, ordering and review of radiographic studies, ordering and performing treatments and interventions, pulse oximetry, re-evaluation of patient's condition and review of old charts     Medications Ordered in  ED Medications  levETIRAcetam (KEPPRA) IVPB 1500 mg/ 100 mL premix ( Intravenous Canceled Entry 06/21/22 1714)    And  levETIRAcetam (KEPPRA) IVPB 1500 mg/ 100 mL premix ( Intravenous Canceled Entry 06/21/22 1715)  levETIRAcetam (KEPPRA) IVPB 1000 mg/100 mL premix (has no administration in time range)  LORazepam (ATIVAN) injection 2 mg (2 mg Intravenous Given 06/21/22 1645)    ED Course/ Medical Decision Making/ A&P Clinical Course as of 06/21/22 2237  Wed Jun 21, 2022  1930 pH, Arterial(!): 7.279 ABG reveals hypercapnia.  BiPAP ordered.  Patient is also getting stat EEG.  On reassessment, patient is responding to noxious  stimuli.  She has a weak gag.  X-ray still pending. [AN]  1958 Medicine team requests that we discussed the case with critical care service given the hypercapnia, the request for intubation if patient gets worse and episode of hypoxia with persistent depressed mental status.  After speaking with Dr. Marlowe Sax I called critical care service.  Dr. Ruthann Cancer and I reviewed the case and the concerning findings.  Dr. Ruthann Cancer does not think patient needs to be ICU.  Plan is for patient to remain on BiPAP.  We will get a repeat ABG.  We will then consult appropriate service for admission. [AN]    Clinical Course User Index [AN] Varney Biles, MD                           Medical Decision Making Amount and/or Complexity of Data Reviewed Labs: ordered. Decision-making details documented in ED Course. Radiology: ordered.  Risk Prescription drug management. Decision regarding hospitalization.   89 year old female with history of stroke comes in with chief complaint of unresponsiveness along with right-sided gaze abnormality.  She was noted to have right-sided tremor/myoclonic jerk by neurology upon patient arrival.  Differential diagnosis considered includes stroke, brain bleed, severe electrolyte abnormality, seizures.  Neurology team assessed the patient immediately.  They  think patient is having status epilepticus.  While in the CT scan she received Ativan 2 mg along with 3 g of Keppra.  After receiving the Ativan, patient became somnolent and is snoring.  On initial reassessment, patient is having a weak gag and she did cough.  She is protecting her airway but is profoundly somnolent and responding to noxious stimuli only.  Patient is on blood thinner and not a TNK candidate. She had intermittent gaze abnormality during my assessment, but over time that resolved.  Patient reassessed multiple times it within the first 1 hour to ensure there was no seizure-like activity or gaze abnormality.  Neurology recommends that patient be admitted, EEG has been ordered.  I have independently interpreted patient's CT scan.  There is no evidence of brain bleed. I have reviewed patient's prior admission and discharge summary for her stroke in August.  Reassessment: Patient remains somnolent about 2 hours after her Ativan.  I have ordered ABG. Initial workup is overall reassuring.  Reassessment: ABG shows hypercapnic respiratory failure. I requested BiPAP.  EEG to be completed at the bedside. Discussed case with medicine and ICU service  -please read their recommendations and ED course.  Reassessment: Had further conversation on CODE STATUS with the daughter. Patient does not want CPR. She does not want intubation for prolonged phase.  They would only want intubation if patient is having transient respiratory depression because of medication side effects.  If she is having hypoxic respiratory failure or hypercapnic respiratory failure, they would prefer not intubating the patient.  Patient was not happy getting intubated for even the thrombectomy last time.  Reassessment: Venous blood gas shows improvement.  Requested medicine to proceed with admission.  Final Clinical Impression(s) / ED Diagnoses Final diagnoses:  Status epilepticus ([place])  Stroke-like symptoms   Somnolence  Acute respiratory failure with hypercapnia [facility])    Rx / DC Orders ED Discharge Orders     None         Varney Biles, MD 06/21/22 2242

## 2022-06-21 NOTE — ED Notes (Signed)
Patient on bipap at this time

## 2022-06-21 NOTE — ED Notes (Signed)
Patient snoring RR : Provider is at bedside

## 2022-06-21 NOTE — ED Notes (Signed)
RT notified of the need for ECO2 at this time

## 2022-06-21 NOTE — ED Notes (Signed)
VITALS IN CT: BP 153/93  02 87% HR 94: PLACED ON 10L oxygen via NRB

## 2022-06-21 NOTE — Procedures (Signed)
Patient Name: Linda [name]  MRN: [id number]  Epilepsy Attending: Lora Havens  Referring Physician/Provider: Lorenza Chick, MD  Date: 06/21/2022 Duration: 24.53 mins  Patient history: 89 y.o. female with a PMHx of DM2, HTN, HLD, Afib on eliquis presenting with a right-sided gaze and right-sided weakness as well as right-sided twitching. EEG to evaluate for seizure.   Level of alertness: Awake, asleep  AEDs during EEG study: LEV  Technical aspects: This EEG study was done with scalp electrodes positioned according to the 10-20 International system of electrode placement. Electrical activity was reviewed with band pass filter of 1-'70Hz'$ , sensitivity of 7 uV/mm, display speed of 15m/sec with a '60Hz'$  notched filter applied as appropriate. EEG data were recorded continuously and digitally stored.  Video monitoring was available and reviewed as appropriate.  Description: The posterior dominant rhythm consists of 8 Hz activity of moderate voltage (25-35 uV) seen predominantly in posterior head regions, asymmetric( left<right) and reactive to eye opening and eye closing. Sleep was characterized by vertex waves, sleep spindles (12 to 14 Hz), maximal frontocentral region. EEG showed continuous 3 to 6 Hz theta-delta slowing in left frontotemporal region.  Hyperventilation and photic stimulation were not performed.     ABNORMALITY - Continuous slow, left frontotemporal region  IMPRESSION: This study is suggestive of cortical dysfunction arising from left frontotemporal region likely secondary to underlying structural abnormality, post-ictal state.  No seizures or epileptiform discharges were seen throughout the recording.  Please note that lack of epileptiform activity during interictal EEG does not exclude the diagnosis of epilepsy.  Kimiah Hibner OBarbra Sarks

## 2022-06-21 NOTE — Progress Notes (Signed)
EEG complete - results pending. Stat  

## 2022-06-21 NOTE — ED Notes (Signed)
AT CT AT THIS TIME

## 2022-06-21 NOTE — ED Notes (Signed)
RT at bedside to perform ABG.

## 2022-06-22 ENCOUNTER — Encounter (HOSPITAL_COMMUNITY): Payer: Self-pay | Admitting: Internal Medicine

## 2022-06-22 ENCOUNTER — Inpatient Hospital Stay (HOSPITAL_COMMUNITY): Payer: Medicare Other

## 2022-06-22 DIAGNOSIS — R29729 NIHSS score 29: Secondary | ICD-10-CM | POA: Diagnosis present

## 2022-06-22 DIAGNOSIS — I482 Chronic atrial fibrillation, unspecified: Secondary | ICD-10-CM | POA: Diagnosis present

## 2022-06-22 DIAGNOSIS — Z66 Do not resuscitate: Secondary | ICD-10-CM | POA: Diagnosis present

## 2022-06-22 DIAGNOSIS — G40901 Epilepsy, unspecified, not intractable, with status epilepticus: Secondary | ICD-10-CM

## 2022-06-22 DIAGNOSIS — E876 Hypokalemia: Secondary | ICD-10-CM | POA: Diagnosis present

## 2022-06-22 DIAGNOSIS — K219 Gastro-esophageal reflux disease without esophagitis: Secondary | ICD-10-CM | POA: Diagnosis present

## 2022-06-22 DIAGNOSIS — J9602 Acute respiratory failure with hypercapnia: Secondary | ICD-10-CM

## 2022-06-22 DIAGNOSIS — B9620 Unspecified Escherichia coli [E. coli] as the cause of diseases classified elsewhere: Secondary | ICD-10-CM | POA: Diagnosis present

## 2022-06-22 DIAGNOSIS — I480 Paroxysmal atrial fibrillation: Secondary | ICD-10-CM | POA: Diagnosis present

## 2022-06-22 DIAGNOSIS — Z85828 Personal history of other malignant neoplasm of skin: Secondary | ICD-10-CM | POA: Diagnosis not present

## 2022-06-22 DIAGNOSIS — R471 Dysarthria and anarthria: Secondary | ICD-10-CM | POA: Diagnosis present

## 2022-06-22 DIAGNOSIS — G8191 Hemiplegia, unspecified affecting right dominant side: Secondary | ICD-10-CM | POA: Diagnosis present

## 2022-06-22 DIAGNOSIS — I5033 Acute on chronic diastolic (congestive) heart failure: Secondary | ICD-10-CM | POA: Diagnosis present

## 2022-06-22 DIAGNOSIS — I69398 Other sequelae of cerebral infarction: Secondary | ICD-10-CM | POA: Diagnosis not present

## 2022-06-22 DIAGNOSIS — F419 Anxiety disorder, unspecified: Secondary | ICD-10-CM | POA: Diagnosis present

## 2022-06-22 DIAGNOSIS — I69320 Aphasia following cerebral infarction: Secondary | ICD-10-CM | POA: Diagnosis not present

## 2022-06-22 DIAGNOSIS — G40101 Localization-related (focal) (partial) symptomatic epilepsy and epileptic syndromes with simple partial seizures, not intractable, with status epilepticus: Secondary | ICD-10-CM | POA: Diagnosis present

## 2022-06-22 DIAGNOSIS — R400 Somnolence: Secondary | ICD-10-CM | POA: Diagnosis not present

## 2022-06-22 DIAGNOSIS — I110 Hypertensive heart disease with heart failure: Secondary | ICD-10-CM | POA: Diagnosis present

## 2022-06-22 DIAGNOSIS — E785 Hyperlipidemia, unspecified: Secondary | ICD-10-CM | POA: Diagnosis present

## 2022-06-22 DIAGNOSIS — R569 Unspecified convulsions: Secondary | ICD-10-CM | POA: Diagnosis not present

## 2022-06-22 DIAGNOSIS — E892 Postprocedural hypoparathyroidism: Secondary | ICD-10-CM | POA: Diagnosis present

## 2022-06-22 DIAGNOSIS — E119 Type 2 diabetes mellitus without complications: Secondary | ICD-10-CM | POA: Diagnosis present

## 2022-06-22 DIAGNOSIS — N179 Acute kidney failure, unspecified: Secondary | ICD-10-CM | POA: Diagnosis present

## 2022-06-22 DIAGNOSIS — H9190 Unspecified hearing loss, unspecified ear: Secondary | ICD-10-CM | POA: Diagnosis present

## 2022-06-22 DIAGNOSIS — I272 Pulmonary hypertension, unspecified: Secondary | ICD-10-CM | POA: Diagnosis present

## 2022-06-22 DIAGNOSIS — N390 Urinary tract infection, site not specified: Secondary | ICD-10-CM | POA: Diagnosis present

## 2022-06-22 LAB — CBC
HCT: 45 % (ref 36.0–46.0)
Hemoglobin: 14.2 g/dL (ref 12.0–15.0)
MCH: 32.1 pg (ref 26.0–34.0)
MCHC: 31.6 g/dL (ref 30.0–36.0)
MCV: 101.8 fL — ABNORMAL HIGH (ref 80.0–100.0)
Platelets: 182 10*3/uL (ref 150–400)
RBC: 4.42 MIL/uL (ref 3.87–5.11)
RDW: 13.9 % (ref 11.5–15.5)
WBC: 8.6 10*3/uL (ref 4.0–10.5)
nRBC: 0 % (ref 0.0–0.2)

## 2022-06-22 LAB — URINALYSIS, ROUTINE W REFLEX MICROSCOPIC
Bilirubin Urine: NEGATIVE
Glucose, UA: NEGATIVE mg/dL
Hgb urine dipstick: NEGATIVE
Ketones, ur: NEGATIVE mg/dL
Nitrite: POSITIVE — AB
Protein, ur: 100 mg/dL — AB
Specific Gravity, Urine: 1.032 — ABNORMAL HIGH (ref 1.005–1.030)
pH: 5 (ref 5.0–8.0)

## 2022-06-22 LAB — HEPARIN LEVEL (UNFRACTIONATED)
Heparin Unfractionated: 1.08 IU/mL — ABNORMAL HIGH (ref 0.30–0.70)
Heparin Unfractionated: >1.1 IU/mL — ABNORMAL HIGH (ref 0.30–0.70)

## 2022-06-22 LAB — BASIC METABOLIC PANEL
Anion gap: 14 (ref 5–15)
BUN: 16 mg/dL (ref 8–23)
CO2: 20 mmol/L — ABNORMAL LOW (ref 22–32)
Calcium: 8.8 mg/dL — ABNORMAL LOW (ref 8.9–10.3)
Chloride: 101 mmol/L (ref 98–111)
Creatinine, Ser: 0.73 mg/dL (ref 0.44–1.00)
GFR, Estimated: >60 mL/min (ref >60)
Glucose, Bld: 130 mg/dL — ABNORMAL HIGH (ref 70–99)
Potassium: 4.4 mmol/L (ref 3.5–5.1)
Sodium: 135 mmol/L (ref 135–145)

## 2022-06-22 LAB — APTT
aPTT: 60 seconds — ABNORMAL HIGH (ref 24–36)
aPTT: 112 seconds — ABNORMAL HIGH (ref 24–36)

## 2022-06-22 LAB — GLUCOSE, CAPILLARY: Glucose-Capillary: 110 mg/dL — ABNORMAL HIGH (ref 70–99)

## 2022-06-22 LAB — CBG MONITORING, ED
Glucose-Capillary: 149 mg/dL — ABNORMAL HIGH (ref 70–99)
Glucose-Capillary: 128 mg/dL — ABNORMAL HIGH (ref 70–99)
Glucose-Capillary: 132 mg/dL — ABNORMAL HIGH (ref 70–99)
Glucose-Capillary: 124 mg/dL — ABNORMAL HIGH (ref 70–99)
Glucose-Capillary: 157 mg/dL — ABNORMAL HIGH (ref 70–99)

## 2022-06-22 LAB — RAPID URINE DRUG SCREEN, HOSP PERFORMED
Amphetamines: NOT DETECTED
Barbiturates: NOT DETECTED
Benzodiazepines: POSITIVE — AB
Cocaine: NOT DETECTED
Opiates: NOT DETECTED
Tetrahydrocannabinol: NOT DETECTED

## 2022-06-22 LAB — BRAIN NATRIURETIC PEPTIDE: B Natriuretic Peptide: 545.3 pg/mL — ABNORMAL HIGH (ref 0.0–100.0)

## 2022-06-22 LAB — MAGNESIUM: Magnesium: 1.8 mg/dL (ref 1.7–2.4)

## 2022-06-22 MED ORDER — HYDRALAZINE HCL 20 MG/ML IJ SOLN
5.0000 mg | Freq: Four times a day (QID) | INTRAMUSCULAR | Status: DC | PRN
  Administered 2022-06-22 – 2022-06-24 (×2): 5 mg via INTRAVENOUS
  Filled 2022-06-22 (×2): qty 1

## 2022-06-22 MED ORDER — INSULIN ASPART 100 UNIT/ML IJ SOLN
0.0000 [IU] | INTRAMUSCULAR | Status: DC
  Administered 2022-06-22 (×2): 1 [IU] via SUBCUTANEOUS
  Administered 2022-06-22: 2 [IU] via SUBCUTANEOUS
  Administered 2022-06-22 – 2022-06-24 (×4): 1 [IU] via SUBCUTANEOUS
  Administered 2022-06-24: 3 [IU] via SUBCUTANEOUS
  Administered 2022-06-24: 1 [IU] via SUBCUTANEOUS
  Administered 2022-06-25: 3 [IU] via SUBCUTANEOUS
  Administered 2022-06-25 – 2022-06-26 (×7): 2 [IU] via SUBCUTANEOUS
  Administered 2022-06-26: 3 [IU] via SUBCUTANEOUS
  Administered 2022-06-26: 1 [IU] via SUBCUTANEOUS
  Administered 2022-06-26 – 2022-06-27 (×3): 2 [IU] via SUBCUTANEOUS

## 2022-06-22 MED ORDER — ACETAMINOPHEN 325 MG PO TABS
650.0000 mg | ORAL_TABLET | Freq: Four times a day (QID) | ORAL | Status: DC | PRN
  Administered 2022-06-25: 650 mg via ORAL
  Filled 2022-06-22 (×2): qty 2

## 2022-06-22 MED ORDER — HEPARIN (PORCINE) 25000 UT/250ML-% IV SOLN
1050.0000 [IU]/h | INTRAVENOUS | Status: DC
  Administered 2022-06-22 – 2022-06-23 (×4): 1000 [IU]/h via INTRAVENOUS
  Administered 2022-06-24: 1050 [IU]/h via INTRAVENOUS
  Filled 2022-06-22 (×3): qty 250

## 2022-06-22 MED ORDER — SODIUM CHLORIDE 0.9 % IV SOLN
750.0000 mg | Freq: Two times a day (BID) | INTRAVENOUS | Status: DC
  Administered 2022-06-22 – 2022-06-23 (×2): 750 mg via INTRAVENOUS
  Filled 2022-06-22 (×4): qty 7.5

## 2022-06-22 MED ORDER — ACETAMINOPHEN 650 MG RE SUPP
650.0000 mg | Freq: Four times a day (QID) | RECTAL | Status: DC | PRN
  Administered 2022-06-22 – 2022-06-24 (×3): 650 mg via RECTAL
  Filled 2022-06-22 (×3): qty 1

## 2022-06-22 MED ORDER — FUROSEMIDE 10 MG/ML IJ SOLN
40.0000 mg | Freq: Once | INTRAMUSCULAR | Status: AC
  Administered 2022-06-22: 40 mg via INTRAVENOUS
  Filled 2022-06-22: qty 4

## 2022-06-22 MED ORDER — LORAZEPAM 2 MG/ML IJ SOLN
1.0000 mg | Freq: Once | INTRAMUSCULAR | Status: AC
  Administered 2022-06-22: 1 mg via INTRAVENOUS
  Filled 2022-06-22: qty 1

## 2022-06-22 NOTE — ED Notes (Signed)
Pt urinated and changed Linens changed at this time. Clean brief on pt. Pt restless and turning over in bed. Son at bedside.

## 2022-06-22 NOTE — Plan of Care (Signed)
  Problem: Coping: Goal: Ability to adjust to condition or change in health will improve Outcome: Not Progressing   Problem: Fluid Volume: Goal: Ability to maintain a balanced intake and output will improve Outcome: Not Progressing   Problem: Health Behavior/Discharge Planning: Goal: Ability to identify and utilize available resources and services will improve Outcome: Not Progressing Goal: Ability to manage health-related needs will improve Outcome: Not Progressing   Problem: Metabolic: Goal: Ability to maintain appropriate glucose levels will improve Outcome: Not Progressing

## 2022-06-22 NOTE — Progress Notes (Signed)
Pt transported to CT and back on BiPAP at this time. RT will continue to monitor.

## 2022-06-22 NOTE — Progress Notes (Signed)
ANTICOAGULATION CONSULT NOTE  Pharmacy Consult for heparin Indication: atrial fibrillation  Allergies  Allergen Reactions   Barium Iodide     Unknown   Lipitor [Atorvastatin] Other (See Comments)    Myalgias    Statins Other (See Comments)    Myalgias    Crestor [Rosuvastatin Calcium] Other (See Comments)    Myalgias    Penicillins Rash   Sulfa Antibiotics Rash   Sulfonamide Derivatives Rash    Patient Measurements: Height: 5' 3.5" (161.3 cm) Weight: 73.9 kg (163 lb) IBW/kg (Calculated) : 53.55 Heparin Dosing Weight: 70kg  Vital Signs: Temp: 97.2 F (36.2 C) (01/04 1135) Temp Source: Axillary (01/04 1135) BP: 165/100 (01/04 1345) Pulse Rate: 105 (01/04 1345)  Labs: Recent Labs    06/21/22 1640 06/21/22 1646 06/21/22 1846 06/21/22 2222 06/22/22 0331 06/22/22 1330  HGB 14.8 15.6* 13.9 7.8* 14.2  --   HCT 45.3 46.0 41.0 23.0* 45.0  --   PLT 217  --   --   --  182  --   APTT 33  --   --   --   --  60*  LABPROT 17.6*  --   --   --   --   --   INR 1.5*  --   --   --   --   --   HEPARINUNFRC  --   --   --   --   --  1.08*  CREATININE 1.05* 0.80  --   --  0.73  --      Estimated Creatinine Clearance: 49.2 mL/min (by C-G formula based on SCr of 0.73 mg/dL).   Medical History: Past Medical History:  Diagnosis Date   Anxiety    Arrhythmia    paroxysmal atrial fibrillation   CHF (congestive heart failure) (HCC)    Colon polyp    Diverticulitis    Dysrhythmia    GERD (gastroesophageal reflux disease)    Hard of hearing    Hoarseness    Hyperlipidemia    Hypertension    Kidney stone    Motion sickness    Parathyroid disease (HCC)    Parathyroidectomy    PONV (postoperative nausea and vomiting)    Pre-diabetes    PVC (premature ventricular contraction)    Skin cancer    Squamous cell carcinoma of skin 07/19/2021   right lower pretibia lateral, EDC   Squamous cell carcinoma of skin 10/13/2014   R ant neck - KA pattern   Squamous cell carcinoma of  skin 09/15/2021   Left dorsal hand, shaved down to fat at time of biopsy so should already be removed.  Will observe for recurrence.   Stroke [facility])    TIA (transient ischemic attack) 04/2018   no deficitis   UTI (lower urinary tract infection)    Varicose veins of both lower extremities    Wears hearing aid in both ears     Assessment: 89yo female presented to ED as code stroke but thought to be focal status epilepticus d/t post-stroke epilepsy, now NPO, to transition from apixaban to heparin for Afib (CVA in August occurred when pt was holding [facility] for epistaxis); last dose of apixaban 1/3 9a.  Anti-Xa level elevated as expected from apixaban dose, aPTT slightly subtherapeutic on 1000 units/hr  Goal of Therapy:  Heparin level 0.3-0.7 units/ml aPTT 66-102 seconds Monitor platelets by anticoagulation protocol: Yes   Plan:  Increase heparin gtt to 1100 units/hr F/u 8 hour aPTT/HL  Bertis Ruddy, PharmD, [facility] Clinical Pharmacist  ED Pharmacist Phone # [phone] 06/22/2022 2:05 PM

## 2022-06-22 NOTE — Progress Notes (Signed)
Unable to perform accurate NIH assessment due to patient's cognition.

## 2022-06-22 NOTE — Progress Notes (Signed)
ANTICOAGULATION CONSULT NOTE - Follow Up Consult  Pharmacy Consult for heparin Indication: atrial fibrillation  Labs: Recent Labs    06/21/22 1640 06/21/22 1646 06/21/22 1846 06/21/22 2222 06/22/22 0331 06/22/22 1330 06/22/22 2211  HGB 14.8 15.6* 13.9 7.8* 14.2  --   --   HCT 45.3 46.0 41.0 23.0* 45.0  --   --   PLT 217  --   --   --  182  --   --   APTT 33  --   --   --   --  60* 112*  LABPROT 17.6*  --   --   --   --   --   --   INR 1.5*  --   --   --   --   --   --   HEPARINUNFRC  --   --   --   --   --  1.08* >1.10*  CREATININE 1.05* 0.80  --   --  0.73  --   --     Assessment: 89yo female supratherapeutic on heparin after rate change; no infusion issues or signs of bleeding per RN.  Goal of Therapy:  aPTT 66-102 seconds   Plan:  Will decrease heparin infusion by ~1 unit/kg/hr to 1000 units/hr and check PTT in 8 hours.    Wynona Neat, PharmD, BCPS  06/22/2022,11:53 PM

## 2022-06-22 NOTE — H&P (Signed)
History and Physical    Linda [name] JSH:[id number] DOB: 1933-11-12 DOA: 06/21/2022  PCP: Crecencio Mc, MD  Patient coming from: SNF  Chief Complaint: AMS  HPI: Linda [name] is a 89 y.o. female with medical history significant of HFpEF, pulmonary hypertension, severe MR status post MitraClip June 2022, type 2 diabetes, stroke, hypertension, A-fib on Eliquis, hypertension, hyperlipidemia, epistaxis.  Patient was admitted in August 2023 for left MCA infarction/M3 occlusion status post TNK and mechanical thrombectomy.  She did have a small right frontal hematoma which was likely hemorrhagic transformation from the TNK. She presented to the ED today from her nursing home with right-sided gaze, right-sided weakness, right-sided twitching, and unresponsiveness.  She had 1 episode of emesis with EMS and was given Zofran.  In the ED, patient noted to be tachypneic and oxygen saturation in the 80s on room air.  Initially placed on nonrebreather.  ABG showing pH 7.27, pCO2 62, pO2 247. Subsequently placed on BiPAP.  Labs showing no leukocytosis, glucose 172, creatinine 1.0 (baseline 0.6-0.8), T. bili 1.5 (slightly elevated on previous labs as well) and remainder of LFTs normal, UA and UDS pending, blood ethanol level undetectable.  Chest x-ray showing cardiomegaly with vascular congestion and question of septal thickening/pulmonary edema.  CT head negative for acute finding. 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  Given reassuring head CT, neurology felt that no further imaging is indicated at this time but may consider MRI brain pending clinical course.  Patient was given IV Ativan 2 mg and IV Keppra 3000 mg in the ED. EEG showing cortical dysfunction arising from the left frontotemporal region likely secondary to underlying structural abnormality, postictal state.  No  seizures or epileptiform discharges were seen throughout the recording.  Neurology had a discussion with the patient's family and she is DNR but family would consent to intubation in the situation where it was thought to be temporary.  Critical care consulted by ED physician and felt that the patient did not need intubation or ICU admission.  Repeat blood gas showing improvement (pH 7.30, pCO2 31).  TRH called to admit.  Patient is currently on BiPAP.  Somnolent and not able to give any history.  No family available at this time.  Review of Systems:  Review of Systems  Reason unable to perform ROS: AMS.    Past Medical History:  Diagnosis Date   Anxiety    Arrhythmia    paroxysmal atrial fibrillation   CHF (congestive heart failure) (HCC)    Colon polyp    Diverticulitis    Dysrhythmia    GERD (gastroesophageal reflux disease)    Hard of hearing    Hoarseness    Hyperlipidemia    Hypertension    Kidney stone    Motion sickness    Parathyroid disease (HCC)    Parathyroidectomy    PONV (postoperative nausea and vomiting)    Pre-diabetes    PVC (premature ventricular contraction)    Skin cancer    Squamous cell carcinoma of skin 07/19/2021   right lower pretibia lateral, EDC   Squamous cell carcinoma of skin 10/13/2014   R ant neck - KA pattern   Squamous cell carcinoma of skin 09/15/2021   Left dorsal hand, shaved down to fat at time of biopsy so should already be removed.  Will observe for recurrence.   Stroke [facility])    TIA (transient ischemic  attack) 04/2018   no deficitis   UTI (lower urinary tract infection)    Varicose veins of both lower extremities    Wears hearing aid in both ears     Past Surgical History:  Procedure Laterality Date   ABDOMINAL HYSTERECTOMY     APPENDECTOMY     CATARACT EXTRACTION W/PHACO Left 03/05/2019   Procedure: CATARACT EXTRACTION PHACO AND INTRAOCULAR LENS PLACEMENT ([place])   0:57 15.6% 9.05;  Surgeon: Leandrew Koyanagi, MD;  Location:  [place];  Service: Ophthalmology;  Laterality: Left;  Diabetic - diet cotrolled   CATARACT EXTRACTION W/PHACO Right 04/09/2019   Procedure: CATARACT EXTRACTION PHACO AND INTRAOCULAR LENS PLACEMENT ([facility]) RIGHT DIABETIC 00:47.6  15.9%  7.60;  Surgeon: Leandrew Koyanagi, MD;  Location: [place];  Service: Ophthalmology;  Laterality: Right;   CHOLECYSTECTOMY  2002   ESOPHAGOGASTRODUODENOSCOPY (EGD) WITH PROPOFOL N/A 12/17/2019   Procedure: ESOPHAGOGASTRODUODENOSCOPY (EGD) WITH PROPOFOL;  Surgeon: Robert Bellow, MD;  Location: [facility];  Service: Endoscopy;  Laterality: N/A;   IR CT HEAD LTD  02/08/2022   IR PERCUTANEOUS ART THROMBECTOMY/INFUSION INTRACRANIAL INC DIAG ANGIO  02/08/2022   LITHOTRIPSY     MITRAL VALVE REPAIR  12/14/2020   PARATHYROIDECTOMY  2004   1 removed   RADIOLOGY WITH ANESTHESIA N/A 02/08/2022   Procedure: IR WITH ANESTHESIA;  Surgeon: Luanne Bras, MD;  Location: [place];  Service: Radiology;  Laterality: N/A;   ROTATOR CUFF REPAIR  2002   TEE WITHOUT CARDIOVERSION N/A 09/29/2020   Procedure: TRANSESOPHAGEAL ECHOCARDIOGRAM (TEE);  Surgeon: Teodoro Spray, MD;  Location: [facility];  Service: Cardiovascular;  Laterality: N/A;   [place] W/ BILATERAL SALPINGOOPHORECTOMY  1984   VEIN LIGATION AND STRIPPING       reports that she has never smoked. She has never used smokeless tobacco. She reports that she does not drink alcohol and does not use drugs.  Allergies  Allergen Reactions   Barium Iodide     Unknown   Lipitor [Atorvastatin] Other (See Comments)    Myalgias    Statins Other (See Comments)    Myalgias    Crestor [Rosuvastatin Calcium] Other (See Comments)    Myalgias    Penicillins Rash   Sulfa Antibiotics Rash   Sulfonamide Derivatives Rash    Family History  Problem Relation Age of Onset   Heart disease Brother    Stroke Mother    Skin cancer Father     Breast cancer Paternal Grandmother     Prior to Admission medications   Medication Sig Start Date End Date Taking? Authorizing Provider  alum & mag hydroxide-simeth (MAALOX PLUS) 400-400-40 MG/5ML suspension Take 30 mLs by mouth every 4 (four) hours as needed for indigestion.   Yes [provider]  apixaban (ELIQUIS) 5 MG TABS tablet Take 1 tablet (5 mg total) by mouth 2 (two) times daily. 02/24/22  Yes Angiulli, Lavon Paganini, PA-C  B Complex-C (B-COMPLEX WITH VITAMIN C) tablet Take 1 tablet by mouth daily. 02/24/22  Yes Angiulli, Lavon Paganini, PA-C  cholecalciferol (CHOLECALCIFEROL) 25 MCG tablet Take 1 tablet (1,000 Units total) by mouth daily. 02/24/22  Yes Angiulli, Lavon Paganini, PA-C  docusate sodium (COLACE) 100 MG capsule Take 1 capsule (100 mg total) by mouth 2 (two) times daily. 02/13/22  Yes de Yolanda Manges, Cortney E, NP  ezetimibe (ZETIA) 10 MG tablet Take 1 tablet (10 mg total) by mouth daily. 02/14/22  Yes de  Yolanda Manges, Cortney E, NP  FLUoxetine (PROZAC) 20 MG capsule Take 1 capsule (20 mg total) by mouth at bedtime. Patient taking differently: Take 20 mg by mouth daily. 02/24/22  Yes Angiulli, Lavon Paganini, PA-C  furosemide (LASIX) 20 MG tablet Take 1 tablet (20 mg total) by mouth daily. 02/24/22  Yes Angiulli, Lavon Paganini, PA-C  loratadine (CLARITIN) 10 MG tablet Take 1 tablet (10 mg total) by mouth daily. 02/24/22  Yes Angiulli, Lavon Paganini, PA-C  losartan (COZAAR) 100 MG tablet Take 100 mg by mouth daily.   Yes [provider]  magnesium gluconate (MAGONATE) 500 MG tablet Take 0.5 tablets (250 mg total) by mouth at bedtime. 02/24/22  Yes Angiulli, Lavon Paganini, PA-C  melatonin 3 MG TABS tablet Take 1 tablet (3 mg total) by mouth at bedtime. 02/24/22  Yes Angiulli, Lavon Paganini, PA-C  metFORMIN (GLUCOPHAGE) 500 MG tablet Take 1 tablet (500 mg total) by mouth 2 (two) times daily with a meal. 02/27/22  Yes Angiulli, Lavon Paganini, PA-C  pantoprazole (PROTONIX) 40 MG tablet Take 1 tablet (40 mg total) by mouth daily at  12 noon. Patient taking differently: Take 40 mg by mouth daily. 02/24/22  Yes Angiulli, Lavon Paganini, PA-C  potassium chloride SA (KLOR-CON M) 20 MEQ tablet Take 1 tablet (20 mEq total) by mouth daily. 02/24/22  Yes Angiulli, Lavon Paganini, PA-C  acetaminophen (TYLENOL) 325 MG tablet Take 2 tablets (650 mg total) by mouth every 4 (four) hours as needed for mild pain (or temp > 37.5 C (99.5 F)). Patient not taking: Reported on 06/21/2022 02/24/22   Angiulli, Lavon Paganini, PA-C  metoprolol succinate (TOPROL-XL) 25 MG 24 hr tablet Take 0.5 tablets (12.5 mg total) by mouth every other day. Patient not taking: Reported on 06/21/2022 02/27/22   Angiulli, Lavon Paganini, PA-C  saline (AYR) GEL Place 1 Application into both nostrils every 6 (six) hours. Patient not taking: Reported on 06/21/2022 02/06/22   Loletha Grayer, MD    Physical Exam: Vitals:   06/21/22 2315 06/21/22 2330 06/21/22 2345 06/22/22 0000  BP: (!) 139/96 (!) 147/103 (!) 157/99 (!) 149/86  Pulse: (!) 108 77 79 75  Resp: (!) 21 (!) 22 (!) 35 (!) 22  Temp:      TempSrc:      SpO2: 99% 97% 98% 100%    Physical Exam Vitals reviewed.  Constitutional:      General: She is not in acute distress. HENT:     Head: Normocephalic and atraumatic.  Cardiovascular:     Rate and Rhythm: Normal rate and regular rhythm.     Pulses: Normal pulses.  Pulmonary:     Effort: Pulmonary effort is normal. No respiratory distress.     Breath sounds: No wheezing.  Abdominal:     General: Bowel sounds are normal. There is no distension.     Palpations: Abdomen is soft.     Tenderness: There is no abdominal tenderness.  Musculoskeletal:     Cervical back: Neck supple.     Right lower leg: No edema.     Left lower leg: No edema.  Skin:    General: Skin is warm and dry.  Neurological:     Comments: Somnolent and not following commands.  Moving bilateral lower extremities spontaneously and no weakness noted.     Labs on Admission: I have personally reviewed following  labs and imaging studies  CBC: Recent Labs  Lab 06/21/22 1640 06/21/22 1646 06/21/22 1846 06/21/22 2222  WBC 7.8  --   --   --  NEUTROABS 5.0  --   --   --   HGB 14.8 15.6* 13.9 7.8*  HCT 45.3 46.0 41.0 23.0*  MCV 97.4  --   --   --   PLT 217  --   --   --    Basic Metabolic Panel: Recent Labs  Lab 06/21/22 1640 06/21/22 1646 06/21/22 1846 06/21/22 2222  NA 135 138 139 147*  K 4.4 4.4 3.9 2.2*  CL 100 103  --   --   CO2 22  --   --   --   GLUCOSE 172* 171*  --   --   BUN 15 20  --   --   CREATININE 1.05* 0.80  --   --   CALCIUM 9.3  --   --   --    GFR: CrCl cannot be calculated (Unknown ideal weight.). Liver Function Tests: Recent Labs  Lab 06/21/22 1640  AST 36  ALT 25  ALKPHOS 78  BILITOT 1.5*  PROT 7.0  ALBUMIN 4.0   No results for input(s): "LIPASE", "AMYLASE" in the last 168 hours. No results for input(s): "AMMONIA" in the last 168 hours. Coagulation Profile: Recent Labs  Lab 06/21/22 1640  INR 1.5*   Cardiac Enzymes: No results for input(s): "CKTOTAL", "CKMB", "CKMBINDEX", "TROPONINI" in the last 168 hours. BNP (last 3 results) Recent Labs    07/13/21 1036  PROBNP 418.0*   HbA1C: No results for input(s): "HGBA1C" in the last 72 hours. CBG: Recent Labs  Lab 06/21/22 1640  GLUCAP 181*   Lipid Profile: No results for input(s): "CHOL", "HDL", "LDLCALC", "TRIG", "CHOLHDL", "LDLDIRECT" in the last 72 hours. Thyroid Function Tests: No results for input(s): "TSH", "T4TOTAL", "FREET4", "T3FREE", "THYROIDAB" in the last 72 hours. Anemia Panel: No results for input(s): "VITAMINB12", "FOLATE", "FERRITIN", "TIBC", "IRON", "RETICCTPCT" in the last 72 hours. Urine analysis:    Component Value Date/Time   COLORURINE STRAW (A) 02/08/2022 1945   APPEARANCEUR CLEAR (A) 02/08/2022 1945   APPEARANCEUR Cloudy (A) 11/15/2017 1358   LABSPEC 1.036 (H) 02/08/2022 1945   PHURINE 7.0 02/08/2022 1945   GLUCOSEU NEGATIVE 02/08/2022 1945   GLUCOSEU  NEGATIVE 08/04/2020 1035   HGBUR NEGATIVE 02/08/2022 1945   BILIRUBINUR NEGATIVE 02/08/2022 1945   BILIRUBINUR Negative 03/22/2021 1533   BILIRUBINUR Negative 11/15/2017 1358   KETONESUR 5 (A) 02/08/2022 1945   PROTEINUR NEGATIVE 02/08/2022 1945   UROBILINOGEN 0.2 03/22/2021 1533   UROBILINOGEN 1.0 08/04/2020 1035   NITRITE NEGATIVE 02/08/2022 1945   LEUKOCYTESUR NEGATIVE 02/08/2022 1945    Radiological Exams on Admission: DG CHEST PORT 1 VIEW  Result Date: 06/21/2022 CLINICAL DATA:  Altered mental status. EXAM: PORTABLE CHEST 1 VIEW COMPARISON:  Radiograph 02/13/2022 FINDINGS: Chronic elevation of right hemidiaphragm. Unchanged cardiomegaly. Stable clips projecting over the expected mitral valve location. Stable mediastinal contours with aortic atherosclerosis. Vascular congestion with question of septal thickening/pulmonary edema. No large pleural effusion or confluent airspace disease. No pneumothorax. Stable osseous structures. IMPRESSION: Cardiomegaly with vascular congestion and question of septal thickening/pulmonary edema. Electronically Signed   By: Keith Rake M.D.   On: 06/21/2022 20:45   CT HEAD CODE STROKE WO CONTRAST  Result Date: 06/21/2022 CLINICAL DATA:  Code stroke. EXAM: CT HEAD WITHOUT CONTRAST TECHNIQUE: Contiguous axial images were obtained from the base of the skull through the vertex without intravenous contrast. RADIATION DOSE REDUCTION: This exam was performed according to the departmental dose-optimization program which includes automated exposure control, adjustment of the mA and/or kV according to  patient size and/or use of iterative reconstruction technique. COMPARISON:  02/15/2022 FINDINGS: Brain: No evidence of acute infarction, hemorrhage, cerebral edema, mass, mass effect, or midline shift. No hydrocephalus or extra-axial collection. Encephalomalacia in the posterior left MCA territory, consistent with expected evolution of the previously noted subacute  infarct in this area. Remote left cerebellar infarct. Vascular: No hyperdense vessel. Skull: Negative for fracture or focal lesion. Sinuses/Orbits: Small mucous retention cysts in the right maxillary sinus. Status post bilateral lens replacements. Other: The mastoid air cells are well aerated. ASPECTS [facility] Stroke Program Early CT Score) - Ganglionic level infarction (caudate, lentiform nuclei, internal capsule, insula, M1-M3 cortex): 7 - Supraganglionic infarction (M4-M6 cortex): 3 Total score (0-10 with 10 being normal): 10 IMPRESSION: No acute intracranial process. ASPECTS is 10. Code stroke imaging results were communicated on 06/21/2022 at 5:14 pm to provider BHAGAT via secure text paging. Electronically Signed   By: Merilyn Baba M.D.   On: 06/21/2022 17:15    EKG: Independently reviewed. A-fib, PVCs. T wave inversions in inferior and lateral leads seen on prior tracing from August 2023 as well.  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 -CT head negative for acute finding. Neurology felt that no further imaging is indicated at this time but may consider MRI brain pending clinical course.   -EEG showing cortical dysfunction arising from the left frontotemporal region likely secondary to underlying structural abnormality, postictal state.  No seizures or epileptiform discharges were seen throughout the recording.  -She received IV Ativan 2 mg in the ED.  Currently somnolent and not following commands, however, moving bilateral lower extremities spontaneously and no weakness noted. -Patient received loading dose IV Keppra in the ED.  Neurology recommending continuing Keppra 1000 mg twice daily and considering Vimpat as a second agent if needed. -Check magnesium level, UA, UDS -Blood ethanol level  undetectable  Acute hypercapnic respiratory failure Possible acute on chronic HFpEF -Patient was unresponsive on arrival to the ED and oxygen saturation in the 80s on room air.   -Initial ABG on nonrebreather showing pH 7.27, pCO2 62, pO2 247.  Subsequently placed on BiPAP and repeat VBG showing improvement (pH 7.30, pCO2 31).  Critical care felt she did not need intubation or ICU admission. -Mental status has improved slightly.  Patient still somnolent but now moving both of her lower extremities spontaneously.  Satting well on BiPAP.  No history of COPD or cigarette smoking. -No fever or leukocytosis.   -Chest x-ray showing cardiomegaly with vascular congestion and question of septal thickening/pulmonary edema. Echo done in August 2023 showing EF 50 to 55%. -Continue BiPAP, wean as tolerated -Keep n.p.o. at this time, aspiration precautions -Check BNP, give Lasix if elevated  Mild AKI -Creatinine 1.0, baseline 0.6-0.8.  Possibly cardiorenal from decompensated CHF. -Check BNP, give Lasix if elevated -Monitor renal function -Avoid nephrotoxic agents/hold home losartan  Severe MR status post MitraClip June 2022 -Echo done in August 2023 showing moderate MR  Type 2 diabetes -A1c 6.6 in August 2023 -Sensitive sliding scale insulin every 4 hours as patient is currently n.p.o.  Chronic A-fib -Patient currently somnolent/n.p.o. Last dose of Eliquis was 06/21/2022 AM and unable to tolerate p.o meds at this time.  Discussed with neurology, okay with IV heparin until patient's mental status improves and she is able to take p.o. meds.  History of CVA -Continue IV heparin  Hypertension  Most recent blood pressure 160/102. -Not able to take p.o. meds at this time -IV hydralazine as needed  DVT prophylaxis: IV heparin gtt Code Status: DNR. Neurology had a discussion with the patient's family and she is DNR but family would consent to intubation in the situation where it was thought to be  temporary.  Family Communication: No family available at this time. Level of care: Progressive Care Unit Admission status: It is my clinical opinion that admission to INPATIENT is reasonable and necessary because of the expectation that this patient will require hospital care that crosses at least 2 midnights to treat this condition based on the medical complexity of the problems presented.  Given the aforementioned information, the predictability of an adverse outcome is felt to be significant.   Shela Leff MD [facility]  If 7PM-7AM, please contact night-coverage [email or web address]  06/22/2022, 12:25 AM

## 2022-06-22 NOTE — Plan of Care (Signed)

## 2022-06-22 NOTE — ED Notes (Signed)
Pt urinated and changed Linens changed at this time. Clean brief on pt. Pt resting.

## 2022-06-22 NOTE — Progress Notes (Signed)
Neurology Progress Note  Brief HPI: Linda [name] is a 89 y.o. female with a PMHx of DM2, HTN, HLD, Afib on eliquis presenting with a right-sided gaze and right-sided weakness as well as right-sided twitching.  She currently resides at [facility] in [place] and was found by staff with a right-sided gaze, right-sided weakness, and unresponsive.  According to documentation obtained from the nursing home she is currently taking all of her medications including Eliquis.  She was last seen normal at 1530 by staff. Per EMS she did have 1 episode of emesis and received 4 mg of Zofran prior to her arrival to the ED.  Upon arrival to the bridge blood pressure is 155/96, she is in A-fib, and glucose is 181.  On arrival to [facility] ED she was noted to be in focal status epilepticus with semiology consistent with focus of her prior left MCA territory stroke.  This resolved with administration of Keppra and Ativan   Subjective: Patient seen in room, she is currently on BiPAP, very sleepy  Current AEDs: Keppra 1000 mg IV every 12 hours  Exam: Vitals:   06/22/22 0727 06/22/22 0739  BP: (!) 143/96   Pulse: 92   Resp: (!) 28   Temp:  98.3 F (36.8 C)  SpO2: 98%    Gen: In bed, NAD Resp: Currently on BiPAP Abd: soft, nt  Neuro: Mental Status: Lethargic, awakens briefly with noxious stimuli States "ow" and "I have to pee" but does not answer questions for examiner Cranial Nerves: Eyes appear midline when examiner opens patient's eyes, does briefly look bilaterally Equal response to eyelash brush bilaterally Hearing is intact to voice Head is midline Motor/sensory: Localizes painful stimuli in all extremities, much more brisk with the left side than the right    Pertinent Labs: BNP 545.3 Urinalysis with trace leukocytes, positive nitrates, rare bacteria  Imaging Reviewed: Head CT - No acute intracranial process. ASPECTS is 10.   Routine EEG- This study is suggestive of  cortical dysfunction arising from left frontotemporal region likely secondary to underlying structural abnormality, post-ictal state.  No seizures or epileptiform discharges were seen throughout the recording.   LTM EEG read: - Continuous slow, left frontotemporal region This study is suggestive of cortical dysfunction arising from left frontotemporal region likely secondary to underlying structural abnormality, post-ictal state.  No seizures or epileptiform discharges were seen throughout the recording.   CXR 06/21/2022 Cardiomegaly with vascular congestion and question of septal thickening/pulmonary edema.    Assessment:  89 y.o. female with a PMHx of DM2, HTN, HLD, Afib on eliquis presenting with a right-sided gaze and right-sided weakness as well as right-sided twitching.  She currently resides at [facility] in [place] and was found by staff with a right-sided gaze, right-sided weakness, right-sided twitching, and unresponsive.  Gaze deviation and twitching improved with 2 mg of Ativan and 3000 mg of Keppra.    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 - Discontinue long-term EEG monitoring - Repeat head CT today given she remains quite sleepy, to confirm no acute intracranial process - Will reduce Keppra to 750 mg twice daily given her renal function is borderline for 1000 mg twice daily and she is very somnolent - Vimpat could be considered as a second agent if  needed - As noted above, intubation is within goals of care for this patient if needed temporarily for seizure control - Low threshold to treat UTI as infections can lower seizure threshold, please avoid any cefepime as this medication lowers seizure threshold - Expect continual  gradual recovery from postictal Todd's phenomenon - Neurology will continue to follow along  Patient seen and examined by NP/APP with MD. MD to update note as needed.   Janine Ores, DNP, FNP-BC [facility] Pager: [phone]  Attending Neurologist's note:  I personally saw this patient, gathering history, performing a full neurologic examination, reviewing relevant labs, personally reviewing relevant imaging including prior head CT, and formulated the assessment and plan, adding the note above for completeness and clarity to accurately reflect my thoughts   Lesleigh Noe MD-PhD [facility] [phone] Available 7 AM to 7 PM, outside these hours please contact Neurologist on call listed on AMION

## 2022-06-22 NOTE — ED Notes (Signed)
Pt urinated, pure wick in place and changed linens changed at this time. Clean brief on pt. Pt restless and turning over in bed. Son at bedside.

## 2022-06-22 NOTE — Progress Notes (Signed)
ANTICOAGULATION CONSULT NOTE - Initial Consult  Pharmacy Consult for heparin Indication: atrial fibrillation  Allergies  Allergen Reactions   Barium Iodide     Unknown   Lipitor [Atorvastatin] Other (See Comments)    Myalgias    Statins Other (See Comments)    Myalgias    Crestor [Rosuvastatin Calcium] Other (See Comments)    Myalgias    Penicillins Rash   Sulfa Antibiotics Rash   Sulfonamide Derivatives Rash    Patient Measurements: Height: 5' 3.5" (161.3 cm) Weight: 73.9 kg (163 lb) IBW/kg (Calculated) : 53.55 Heparin Dosing Weight: 70kg  Vital Signs: Temp: 98 F (36.7 C) (01/03 2246) Temp Source: Axillary (01/03 1836) BP: 160/102 (01/04 0015) Pulse Rate: 79 (01/04 0015)  Labs: Recent Labs    06/21/22 1640 06/21/22 1646 06/21/22 1846 06/21/22 2222  HGB 14.8 15.6* 13.9 7.8*  HCT 45.3 46.0 41.0 23.0*  PLT 217  --   --   --   APTT 33  --   --   --   LABPROT 17.6*  --   --   --   INR 1.5*  --   --   --   CREATININE 1.05* 0.80  --   --     Estimated Creatinine Clearance: 49.2 mL/min (by C-G formula based on SCr of 0.8 mg/dL).   Medical History: Past Medical History:  Diagnosis Date   Anxiety    Arrhythmia    paroxysmal atrial fibrillation   CHF (congestive heart failure) (HCC)    Colon polyp    Diverticulitis    Dysrhythmia    GERD (gastroesophageal reflux disease)    Hard of hearing    Hoarseness    Hyperlipidemia    Hypertension    Kidney stone    Motion sickness    Parathyroid disease (HCC)    Parathyroidectomy    PONV (postoperative nausea and vomiting)    Pre-diabetes    PVC (premature ventricular contraction)    Skin cancer    Squamous cell carcinoma of skin 07/19/2021   right lower pretibia lateral, EDC   Squamous cell carcinoma of skin 10/13/2014   R ant neck - KA pattern   Squamous cell carcinoma of skin 09/15/2021   Left dorsal hand, shaved down to fat at time of biopsy so should already be removed.  Will observe for  recurrence.   Stroke [facility])    TIA (transient ischemic attack) 04/2018   no deficitis   UTI (lower urinary tract infection)    Varicose veins of both lower extremities    Wears hearing aid in both ears     Assessment: 89yo female presented to ED as code stroke but thought to be focal status epilepticus d/t post-stroke epilepsy, now NPO, to transition from apixaban to heparin for Afib (CVA in August occurred when pt was holding [facility] for epistaxis); last dose of apixaban 1/3 9a.  Goal of Therapy:  Heparin level 0.3-0.7 units/ml aPTT 66-102 seconds Monitor platelets by anticoagulation protocol: Yes   Plan:  Heparin infusion at 1000 units/hr. Monitor heparin levels, aPTT (while apixaban affects anti-Xa assay), and CBC.  Wynona Neat, PharmD, BCPS  06/22/2022,1:31 AM

## 2022-06-22 NOTE — Progress Notes (Signed)
vLTM discontinued  No skin breakdown noted at all skin sites

## 2022-06-22 NOTE — Procedures (Addendum)
Patient Name: Linda [name]  MRN: [id number]  Epilepsy Attending: Lora Havens  Referring Physician/Provider: Lorenza Chick, MD  Duration: 21/08/2022 2038 to 06/22/2022 2951   Patient history: 89 y.o. female with a PMHx of DM2, HTN, HLD, Afib on eliquis presenting with a right-sided gaze and right-sided weakness as well as right-sided twitching. EEG to evaluate for seizure.    Level of alertness: Awake, asleep   AEDs during EEG study: LEV   Technical aspects: This EEG study was done with scalp electrodes positioned according to the 10-20 International system of electrode placement. Electrical activity was reviewed with band pass filter of 1-'70Hz'$ , sensitivity of 7 uV/mm, display speed of 54m/sec with a '60Hz'$  notched filter applied as appropriate. EEG data were recorded continuously and digitally stored.  Video monitoring was available and reviewed as appropriate.   Description: The posterior dominant rhythm consists of 8 Hz activity of moderate voltage (25-35 uV) seen predominantly in posterior head regions, asymmetric( left<right) and reactive to eye opening and eye closing. Sleep was characterized by vertex waves, sleep spindles (12 to 14 Hz), maximal frontocentral region. EEG showed continuous 3 to 6 Hz theta-delta slowing in left frontotemporal region. Hyperventilation and photic stimulation were not performed.      ABNORMALITY - Continuous slow, left frontotemporal region   IMPRESSION: This study is suggestive of cortical dysfunction arising from left frontotemporal region likely secondary to underlying structural abnormality, post-ictal state.  No seizures or epileptiform discharges were seen throughout the recording.   Trace Cederberg OBarbra Sarks

## 2022-06-22 NOTE — ED Notes (Signed)
Attempted to draw labs x3 at this time without success phlebotomy notified

## 2022-06-22 NOTE — Progress Notes (Signed)
Subjective: Patient admitted this morning, see detailed H&P by Dr. Marlowe Sax 89 year old female with medical history of HFpEF, pulmonary hypertension, severe MR status post MitraClip in June 2022, diabetes mellitus type 2, stroke, hypertension, atrial fibrillation on Eliquis, hypertension, hyperlipidemia.  Patient was admitted in August 2023 for left MCA infarction/M3 occlusion s/p TNK and mechanical thrombectomy.  She did have small right frontal hematoma which was likely hemorrhagic transformation from the TNK.  Came to ED from nursing home with right-sided gaze, right-sided weakness and right-sided twitching and unresponsiveness.  She had 1 episode of emesis with EMS and was given Zofran.  In the ED she was found to be tachypneic with O2 sats in 80s on room air, she was placed on nonrebreather and subsequently placed on BiPAP.  Chest x-ray showed cardiomegaly with vascular congestion question pulmonary edema. Neurology was consulted, CT head was negative.  Patient received Ativan and Keppra.  EEG showed cortical dysfunction arising from left frontal temporal region likely secondary to  underlying structural abnormality, postictal state. Neurology discussed with patient's family and patient was made DNR.  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 -Patient received Ativan in the ED also received IV Keppra  Acute hypercapnic respiratory failure -Probable acute on chronic HFpEF -Patient was unresponsive on arrival to ED with O2 sats in 80s on  room air -Initially placed on nonrebreather, switched to BiPAP --BNP 545.3 -Will give 1 dose of Lasix 40 mg IV  Mild AKI -Creatinine 1.0, baseline creatinine 0.6-0.8 -Possibly cardiorenal from decompensated CHF  Severe MR s/p MitraClip June 2022 -Echocardiogram done in August 2023 showed moderate MR  Diabetes mellitus type 2 -Hemoglobin A1c 6.6 in August 2023 -Continue sliding scale insulin with NovoLog  Chronic atrial fibrillation -Patient was taking Eliquis at home -Dr. Marlowe Sax discussed with neurology, patient started on IV heparin  Hypertension -Continue as needed IV hydralazine -P.o. meds on hold due to altered mental status  UTI -Patient has abnormal UA -Will obtain urine culture -Start ceftriaxone; follow urine culture results     Oswald Hillock [facility]

## 2022-06-23 DIAGNOSIS — J9602 Acute respiratory failure with hypercapnia: Secondary | ICD-10-CM | POA: Diagnosis not present

## 2022-06-23 DIAGNOSIS — R400 Somnolence: Secondary | ICD-10-CM

## 2022-06-23 DIAGNOSIS — G40901 Epilepsy, unspecified, not intractable, with status epilepticus: Secondary | ICD-10-CM | POA: Diagnosis not present

## 2022-06-23 DIAGNOSIS — R569 Unspecified convulsions: Secondary | ICD-10-CM | POA: Diagnosis not present

## 2022-06-23 LAB — COMPREHENSIVE METABOLIC PANEL
ALT: 27 U/L (ref 0–44)
AST: 35 U/L (ref 15–41)
Albumin: 3.4 g/dL — ABNORMAL LOW (ref 3.5–5.0)
Alkaline Phosphatase: 65 U/L (ref 38–126)
Anion gap: 11 (ref 5–15)
BUN: 14 mg/dL (ref 8–23)
CO2: 29 mmol/L (ref 22–32)
Calcium: 8.9 mg/dL (ref 8.9–10.3)
Chloride: 101 mmol/L (ref 98–111)
Creatinine, Ser: 0.76 mg/dL (ref 0.44–1.00)
GFR, Estimated: >60 mL/min (ref >60)
Glucose, Bld: 113 mg/dL — ABNORMAL HIGH (ref 70–99)
Potassium: 3.4 mmol/L — ABNORMAL LOW (ref 3.5–5.1)
Sodium: 141 mmol/L (ref 135–145)
Total Bilirubin: 1.9 mg/dL — ABNORMAL HIGH (ref 0.3–1.2)
Total Protein: 6.1 g/dL — ABNORMAL LOW (ref 6.5–8.1)

## 2022-06-23 LAB — CBC
HCT: 43.9 % (ref 36.0–46.0)
Hemoglobin: 14.2 g/dL (ref 12.0–15.0)
MCH: 31.4 pg (ref 26.0–34.0)
MCHC: 32.3 g/dL (ref 30.0–36.0)
MCV: 97.1 fL (ref 80.0–100.0)
Platelets: 170 10*3/uL (ref 150–400)
RBC: 4.52 MIL/uL (ref 3.87–5.11)
RDW: 14 % (ref 11.5–15.5)
WBC: 5.6 10*3/uL (ref 4.0–10.5)
nRBC: 0 % (ref 0.0–0.2)

## 2022-06-23 LAB — HEPARIN LEVEL (UNFRACTIONATED): Heparin Unfractionated: 0.8 IU/mL — ABNORMAL HIGH (ref 0.30–0.70)

## 2022-06-23 LAB — APTT: aPTT: 63 seconds — ABNORMAL HIGH (ref 24–36)

## 2022-06-23 LAB — GLUCOSE, CAPILLARY
Glucose-Capillary: 100 mg/dL — ABNORMAL HIGH (ref 70–99)
Glucose-Capillary: 113 mg/dL — ABNORMAL HIGH (ref 70–99)
Glucose-Capillary: 121 mg/dL — ABNORMAL HIGH (ref 70–99)
Glucose-Capillary: 134 mg/dL — ABNORMAL HIGH (ref 70–99)
Glucose-Capillary: 99 mg/dL (ref 70–99)
Glucose-Capillary: 99 mg/dL (ref 70–99)

## 2022-06-23 LAB — MRSA NEXT GEN BY PCR, NASAL: MRSA by PCR Next Gen: NOT DETECTED

## 2022-06-23 MED ORDER — PHENAZOPYRIDINE HCL 100 MG PO TABS
100.0000 mg | ORAL_TABLET | Freq: Three times a day (TID) | ORAL | Status: DC
  Filled 2022-06-23 (×4): qty 1

## 2022-06-23 MED ORDER — POTASSIUM CHLORIDE 10 MEQ/100ML IV SOLN
10.0000 meq | Freq: Once | INTRAVENOUS | Status: AC
  Administered 2022-06-23: 10 meq via INTRAVENOUS
  Filled 2022-06-23: qty 100

## 2022-06-23 MED ORDER — FUROSEMIDE 10 MG/ML IJ SOLN
40.0000 mg | Freq: Every day | INTRAMUSCULAR | Status: DC
  Administered 2022-06-23 – 2022-06-24 (×2): 40 mg via INTRAVENOUS
  Filled 2022-06-23 (×2): qty 4

## 2022-06-23 MED ORDER — POTASSIUM CHLORIDE CRYS ER 20 MEQ PO TBCR: 40.0000 meq | EXTENDED_RELEASE_TABLET | Freq: Once | ORAL | Status: DC

## 2022-06-23 MED ORDER — LEVETIRACETAM IN NACL 500 MG/100ML IV SOLN
500.0000 mg | Freq: Two times a day (BID) | INTRAVENOUS | Status: DC
  Administered 2022-06-24: 500 mg via INTRAVENOUS
  Filled 2022-06-23: qty 100

## 2022-06-23 MED ORDER — SODIUM CHLORIDE 0.9 % IV SOLN
2.0000 g | INTRAVENOUS | Status: DC
  Administered 2022-06-23: 2 g via INTRAVENOUS
  Filled 2022-06-23: qty 20

## 2022-06-23 MED ORDER — SODIUM CHLORIDE 0.9 % IV SOLN
750.0000 mg | Freq: Two times a day (BID) | INTRAVENOUS | Status: AC
  Administered 2022-06-23: 750 mg via INTRAVENOUS
  Filled 2022-06-23: qty 7.5

## 2022-06-23 NOTE — Progress Notes (Signed)
2000: Pt crying out in pain and agitated every time she urinates. Pt sits up in bed and difficult to console. Pure Wik and mesh pants changed and peri care given. Pure Wik placed in correct area. No reddened or open areas in peri area. Pt with new Dx of UTI and on IV ATB. Pt given Tylenol 650 mg Supp. Family at bedside. Pt calmed down and infrequently crying out in pain.  2100: Dr. Kristopher Oppenheim notified and ordered Pyridium but pt NPO due to failing Swallow Study on day shift. Dr. Bridgett Larsson notified of NPO status. 2200: Pt resting with eyes closed and has been most of the time since Tylenol Supp given. Less pain and discomfort noted.

## 2022-06-23 NOTE — Progress Notes (Signed)
Called to son and daughter to inform about patient being moved, but they did not received the call.

## 2022-06-23 NOTE — Progress Notes (Signed)
Patient was confused and was trying to get out of the bed and is lethargic. So, patient was moved to room number 16, near the nurse's station

## 2022-06-23 NOTE — Progress Notes (Addendum)
ANTICOAGULATION CONSULT NOTE - Initial Consult  Pharmacy Consult for heparin Indication: atrial fibrillation  Allergies  Allergen Reactions   Barium Iodide     Unknown   Lipitor [Atorvastatin] Other (See Comments)    Myalgias    Statins Other (See Comments)    Myalgias    Crestor [Rosuvastatin Calcium] Other (See Comments)    Myalgias    Penicillins Rash   Sulfa Antibiotics Rash   Sulfonamide Derivatives Rash    Patient Measurements: Height: 5' 3.5" (161.3 cm) Weight: 73.9 kg (163 lb) IBW/kg (Calculated) : 53.55 Heparin Dosing Weight: 69  Vital Signs: Temp: 98.4 F (36.9 C) (01/05 0803) Temp Source: Axillary (01/05 0803) BP: 139/75 (01/05 0803) Pulse Rate: 56 (01/05 0803)  Labs: Recent Labs    06/21/22 1640 06/21/22 1646 06/21/22 1846 06/21/22 2222 06/22/22 0331 06/22/22 1330 06/22/22 2211 06/23/22 0521  HGB 14.8 15.6*   < > 7.8* 14.2  --   --  14.2  HCT 45.3 46.0   < > 23.0* 45.0  --   --  43.9  PLT 217  --   --   --  182  --   --  170  APTT 33  --   --   --   --  60* 112* 63*  LABPROT 17.6*  --   --   --   --   --   --   --   INR 1.5*  --   --   --   --   --   --   --   HEPARINUNFRC  --   --   --   --   --  1.08* >1.10* 0.80*  CREATININE 1.05* 0.80  --   --  0.73  --   --  0.76   < > = values in this interval not displayed.    Estimated Creatinine Clearance: 49.2 mL/min (by C-G formula based on SCr of 0.76 mg/dL).   Medical History: Past Medical History:  Diagnosis Date   Anxiety    Arrhythmia    paroxysmal atrial fibrillation   CHF (congestive heart failure) (HCC)    Colon polyp    Diverticulitis    Dysrhythmia    GERD (gastroesophageal reflux disease)    Hard of hearing    Hoarseness    Hyperlipidemia    Hypertension    Kidney stone    Motion sickness    Parathyroid disease (HCC)    Parathyroidectomy    PONV (postoperative nausea and vomiting)    Pre-diabetes    PVC (premature ventricular contraction)    Skin cancer    Squamous  cell carcinoma of skin 07/19/2021   right lower pretibia lateral, EDC   Squamous cell carcinoma of skin 10/13/2014   R ant neck - KA pattern   Squamous cell carcinoma of skin 09/15/2021   Left dorsal hand, shaved down to fat at time of biopsy so should already be removed.  Will observe for recurrence.   Stroke [facility])    TIA (transient ischemic attack) 04/2018   no deficitis   UTI (lower urinary tract infection)    Varicose veins of both lower extremities    Wears hearing aid in both ears    Assessment: 89 yo W on apixaban PTA for afib, now held for seizures. Pharmacy consulted for heparin.    Heparin level 0.8 is supratherapeutic due to apixaban. APTT is 63 and slightly subtherapeutic on 1000 units/hr.  No issues with infusion or bleeding per RN.  Goal of Therapy:  Heparin level 0.3- 0.7 units/ml APTT 66- 102 seconds Monitor platelets by anticoagulation protocol: Yes   Plan:  Increase heparin to 1050 units/hr F/u aPTT until correlates with heparin level  Monitor daily aPTT, heparin level, CBC Monitor for signs/symptoms of bleeding   Benetta Spar, PharmD, BCPS, BCCP Clinical Pharmacist  Please check AMION for all [place] phone numbers After 10:00 PM, call [place]

## 2022-06-23 NOTE — Progress Notes (Signed)
Neurology Progress Note  Brief HPI: Linda [name] is a 89 y.o. female with a PMHx of DM2, HTN, HLD, Afib on eliquis presenting with a right-sided gaze and right-sided weakness as well as right-sided twitching.  She currently resides at [facility] in [place] and was found by staff with a right-sided gaze, right-sided weakness, and unresponsive.  According to documentation obtained from the nursing home she is currently taking all of her medications including Eliquis.  She was last seen normal at 1530 by staff. Per EMS she did have 1 episode of emesis and received 4 mg of Zofran prior to her arrival to the ED.  Upon arrival to the bridge blood pressure is 155/96, she is in A-fib, and glucose is 181.  On arrival to [facility] ED she was noted to be in focal status epilepticus with semiology consistent with focus of her prior left MCA territory stroke.  This resolved with administration of Keppra and Ativan   Subjective: Patient seen in room with no family at the bedside.  She is drowsy but arouses to loud voice and touch. Febrile to 100.9 yesterday, improved with tylenol x 1 dose   Exam: Current vital signs: BP 139/75 (BP Location: Right Arm)   Pulse (!) 56   Temp 98.4 F (36.9 C) (Axillary)   Resp (!) 22   Ht 5' 3.5" (1.613 m)   Wt 73.9 kg   SpO2 97%   BMI 28.42 kg/m  Vital signs in last 24 hours: Temp:  [97.2 F (36.2 C)-100.9 F (38.3 C)] 98.4 F (36.9 C) (01/05 0803) Pulse Rate:  [44-105] 56 (01/05 0803) Resp:  [17-42] 22 (01/05 0803) BP: (115-184)/(75-125) 139/75 (01/05 0803) SpO2:  [91 %-100 %] 97 % (01/05 0803) FiO2 (%):  [40 %-50 %] 50 % (01/04 1515)  Gen: In bed, NAD Resp: Respirations regular and unlabored  Neuro: Mental Status: Drowsy but awakens to loud voice and touch.  Able to state name but then perseverates on "[name]" as answer to all further questions.  Intermittently follows simple commands Cranial Nerves: Pupils equal round and reactive, face  symmetrical, hearing intact to voice, voice slightly dysarthric Motor/sensory: Able to move all extremities not purposefully with antigravity strength DTRs:  2+ in bicep, unable to elicit patellar    Pertinent Labs: BNP 545.3 Urinalysis with trace leukocytes, positive nitrates, rare bacteria  Imaging Reviewed: Head CT - No acute intracranial process. ASPECTS is 10.   Repeat head CT 1/4: no hemorrhage or new infarct  Routine EEG- This study is suggestive of cortical dysfunction arising from left frontotemporal region likely secondary to underlying structural abnormality, post-ictal state.  No seizures or epileptiform discharges were seen throughout the recording.   LTM EEG read 06/21/2022 2038 to 06/22/2022 0093  - Continuous slow, left frontotemporal region This study is suggestive of cortical dysfunction arising from left frontotemporal region likely secondary to underlying structural abnormality, post-ictal state.  No seizures or epileptiform discharges were seen throughout the recording.   CXR 06/21/2022 Cardiomegaly with vascular congestion and question of septal thickening/pulmonary edema.    Assessment:  89 y.o. female with a PMHx of DM2, HTN, HLD, Afib on eliquis presenting with a right-sided gaze and right-sided weakness as well as right-sided twitching.  She currently resides at [facility] in [place] and was found by staff with a right-sided gaze, right-sided weakness, right-sided twitching, and unresponsive.  Gaze deviation and twitching improved with 2 mg of Ativan and 3000 mg of Keppra.  Patient is more alert this morning and is able to move bilateral upper and lower extremities with good antigravity strength.  However, she is able to state her name but cannot answer other questions.  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 MD to update note as needed.   [place] , MSN, AGACNP-BC [facility] See Amion for schedule and pager information 06/23/2022 9:10 AM   Attending Neurologist's note:  I personally saw this patient, gathering history, performing a full neurologic examination, reviewing relevant labs, personally reviewing relevant imaging including head CT, and formulated the assessment and plan, adding the note above for completeness and clarity to accurately reflect my thoughts

## 2022-06-23 NOTE — Progress Notes (Signed)
Mobility Specialist: Progress Note   06/23/22 1124  Mobility  Activity Transferred to/from [facility]  Level of Assistance +2 (takes two people)  Assistive Device Other (Comment) (HHA)  Distance Ambulated (ft) 2 ft  Activity Response Tolerated fair  $Mobility charge 1 Mobility   Pt received attempting to get OOB with NS in the room. Pt requesting to use BR so assisted pt to [facility] +2 HHA. Pt impulsive to stand and attempted to sit prematurely. Cues for hand placement and direction. Pt back to bed once finished and assisted with pericare. Pt has call bell at her side. Bed alarm is on.   [place] T[place] Mobility Specialist Please contact via SecureChat or Rehab office at [phone]

## 2022-06-23 NOTE — Evaluation (Signed)
Physical Therapy Evaluation Patient Details Name: Linda [name] MRN: [id number] DOB: September 30, 1933 Today's Date: 06/23/2022  History of Present Illness  Patient is a 89 y/o female who presents on 06/21/22 with right sided weakness, right gaze and unresponsiveness. 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 EEG- cortical dysfunction in left temporal region. PMH includes HFpEF, pulmonary HTN, severe MR s/p MitraClip in June 2022, DM, CVA in Aug 2023, HTN, A-fib on Eliquis.  Clinical Impression  Patient presents with lethargy, language difficulties, weakness and impaired mobility s/p above. Pt not able to provide PLOF/history due to level of arousal and communication deficits. Per chart, pt is from a nursing home. Today, pt requires Max A of 2 for bed mobility and Min A of 2 for standing bouts at EOB. Pt kept eyes closed for most of session and not following any commands. Able to side step a few steps with Mod A of 2. Hoping when arousal improves so will mobility. Recommend return to nursing home to maximize independence and mobility. Will follow acutely.       Recommendations for follow up therapy are one component of a multi-disciplinary discharge planning process, led by the attending physician.  Recommendations may be updated based on patient status, additional functional criteria and insurance authorization.  Follow Up Recommendations Skilled nursing-short term rehab (<3 hours/day) (return to [facility]) Can patient physically be transported by private vehicle: No    Assistance Recommended at Discharge Frequent or constant Supervision/Assistance  Patient can return home with the following  Two people to help with walking and/or transfers;A lot of help with bathing/dressing/bathroom;Assist for transportation;Direct supervision/assist for medications management;Direct supervision/assist for financial management    Equipment  Recommendations Other (comment) (defer to facility)  Recommendations for Other Services       Functional Status Assessment Patient has had a recent decline in their functional status and/or demonstrates limited ability to make significant improvements in function in a reasonable and predictable amount of time     Precautions / Restrictions Precautions Precautions: Fall Restrictions Weight Bearing Restrictions: No      Mobility  Bed Mobility Overal bed mobility: Needs Assistance Bed Mobility: Supine to Sit     Supine to sit: Max assist, +2 for physical assistance     General bed mobility comments: Assist with LEs, trunk and getting bottom to EOB, not able to follow commands and eyes remained close, lethargic,    Transfers Overall transfer level: Needs assistance Equipment used: 2 person hand held assist Transfers: Sit to/from Stand Sit to Stand: Min assist, +2 physical assistance           General transfer comment: Stood from EOB x2 with HHA.    Ambulation/Gait Ambulation/Gait assistance: Mod assist, +2 physical assistance Gait Distance (Feet): 2 Feet Assistive device: 2 person hand held assist         General Gait Details: Attempted to take steps along side bed with Mod A of 2.  Stairs            Wheelchair Mobility    Modified Rankin (Stroke Patients Only)       Balance Overall balance assessment: Needs assistance Sitting-balance support: Feet supported, No upper extremity supported Sitting balance-Leahy Scale: Fair Sitting balance - Comments: Close min guard for safety.   Standing balance support: During functional activity, Bilateral upper extremity supported Standing balance-Leahy Scale: Poor Standing balance comment: Relies on external support  Pertinent Vitals/Pain Pain Assessment Pain Assessment: Faces Faces Pain Scale: No hurt    Home Living Family/patient expects to be discharged to::  Unsure                   Additional Comments: Unable to answer any questions regarding PLOF/history due to lethargy and aphasia. Info below taken from 4 months ago so unsure of accuracy.    Prior Function Prior Level of Function : Patient poor historian/Family not available             Mobility Comments: no AD in apartment. RW to dining room and in community. ADLs Comments: Drives to get her hair and nails does, and also small grocery shopping trips. She prepares her own breakfast and dinner in her apartment and goes to dining room for lunch.     Hand Dominance   Dominant Hand: Right    Extremity/Trunk Assessment   Upper Extremity Assessment Upper Extremity Assessment: Defer to OT evaluation;Difficult to assess due to impaired cognition    Lower Extremity Assessment Lower Extremity Assessment: Difficult to assess due to impaired cognition (able to stand without knee buckling with assist of 2)    Cervical / Trunk Assessment Cervical / Trunk Assessment: Kyphotic  Communication   Communication: Receptive difficulties;Expressive difficulties;HOH  Cognition Arousal/Alertness: Lethargic Behavior During Therapy: Flat affect Overall Cognitive Status: Difficult to assess                                 General Comments: Pt with eyes closed for most of session, not following verbal commands likely due to aphasia.        General Comments General comments (skin integrity, edema, etc.): VSS on RA.    Exercises     Assessment/Plan    PT Assessment Patient needs continued PT services  PT Problem List Decreased strength;Decreased mobility;Decreased balance;Decreased cognition       PT Treatment Interventions Therapeutic activities;Neuromuscular re-education;Functional mobility training;Balance training;Patient/family education;Therapeutic exercise;Gait training;DME instruction;Cognitive remediation    PT Goals (Current goals can be found in the Care Plan  section)  Acute Rehab PT Goals Patient Stated Goal: none stated PT Goal Formulation: Patient unable to participate in goal setting Time For Goal Achievement: 07/07/22 Potential to Achieve Goals: Fair    Frequency Min 3X/week     Co-evaluation               AM-PAC PT "6 Clicks" Mobility  Outcome Measure Help needed turning from your back to your side while in a flat bed without using bedrails?: A Little Help needed moving from lying on your back to sitting on the side of a flat bed without using bedrails?: Total Help needed moving to and from a bed to a chair (including a wheelchair)?: A Lot Help needed standing up from a chair using your arms (e.g., wheelchair or bedside chair)?: A Lot Help needed to walk in hospital room?: Total Help needed climbing 3-5 steps with a railing? : Total 6 Click Score: 10    End of Session Equipment Utilized During Treatment: Oxygen;Gait belt Activity Tolerance: Patient limited by lethargy Patient left: in bed;with call bell/phone within reach;with bed alarm set Nurse Communication: Mobility status PT Visit Diagnosis: Difficulty in walking, not elsewhere classified (R26.2);Muscle weakness (generalized) (M62.81);Unsteadiness on feet (R26.81)    Time: 1350-1407 PT Time Calculation (min) (ACUTE ONLY): 17 min   Charges:   PT Evaluation $PT Eval Moderate Complexity: 1  Mod          Zettie Cooley, DPT Acute Rehabilitation Services Secure chat preferred Office [place] 06/23/2022, 3:30 PM

## 2022-06-23 NOTE — Progress Notes (Signed)
[facility]  PROGRESS NOTE  Linda [name] [facility] WJX:[id number] DOB: [birth year] DOA: 06/21/2022 PCP: Crecencio Mc, MD   Brief HPI:    89 year old female with medical history of HFpEF, pulmonary hypertension, severe MR status post MitraClip in June 2022, diabetes mellitus type 2, stroke, hypertension, atrial fibrillation on Eliquis, hypertension, hyperlipidemia.  Patient was admitted in August 2023 for left MCA infarction/M3 occlusion s/p TNK and mechanical thrombectomy.  She did have small right frontal hematoma which was likely hemorrhagic transformation from the TNK.  Came to ED from nursing home with right-sided gaze, right-sided weakness and right-sided twitching and unresponsiveness.  She had 1 episode of emesis with EMS and was given Zofran.  In the ED she was found to be tachypneic with O2 sats in 80s on room air, she was placed on nonrebreather and subsequently placed on BiPAP.  Chest x-ray showed cardiomegaly with vascular congestion question pulmonary edema. Neurology was consulted, CT head was negative.  Patient received Ativan and Keppra.  EEG showed cortical dysfunction arising from left frontal temporal region likely secondary to  underlying structural abnormality, postictal state. Neurology discussed with patient's family and patient was made DNR.   Subjective   Patient seen, somnolent but arousable.   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 -Patient received Ativan in the ED also received IV Keppra -Dose of Keppra to be changed to 500 mg p.o. twice daily from tomorrow morning due to hypersomnolence   Acute hypercapnic  respiratory failure -Probable acute on chronic HFpEF -Patient was unresponsive on arrival to ED with O2 sats in 80s on room air -Initially placed on nonrebreather, switched to BiPAP --BNP 545.3 -She received Lasix 40 mg IV x 1 yesterday -She is currently off BiPAP -Will start Lasix 40 mg IV daily   Hypokalemia -Potassium is 3.4 -Replace potassium and follow BMP in am   Severe MR s/p MitraClip June 2022 -Echocardiogram done in August 2023 showed moderate MR   Diabetes mellitus type 2 -Hemoglobin A1c 6.6 in August 2023 -Continue sliding scale insulin with NovoLog   Chronic atrial fibrillation -Patient was taking Eliquis at home -Dr. Marlowe Sax discussed with neurology, patient started on IV heparin   Hypertension -Continue as needed IV hydralazine -P.o. meds on hold due to altered mental status   UTI -Patient has abnormal UA -Will obtain urine culture -Start ceftriaxone; follow urine culture results      Medications     insulin aspart  0-9 Units Subcutaneous Q4H     Data Reviewed:   CBG:  Recent Labs  Lab 06/23/22 0010 06/23/22 0446 06/23/22 0806 06/23/22 1150 06/23/22 1523  GLUCAP 100* 113* 121* 99 99    SpO2: 94 % O2 Flow Rate (L/min): 10 L/min FiO2 (%): 50 %    Vitals:   06/23/22 0441 06/23/22 0803 06/23/22 1147 06/23/22 1522  BP: 115/81 139/75 (!) 150/76 (!) 158/88  Pulse: 77 (!) 56 (!) 57 (!) 43  Resp: (!) 28 (!) 22 (!) 28 17  Temp: 98.8 F (37.1 C) 98.4 F (36.9 C) 98.4 F (36.9 C) 98.7 F (37.1 C)  TempSrc: Axillary Axillary Axillary Oral  SpO2: 93% 97% 100% 94%  Weight:      Height:  Data Reviewed:  Basic Metabolic Panel: Recent Labs  Lab 06/21/22 1640 06/21/22 1646 06/21/22 1846 06/21/22 2222 06/22/22 0331 06/23/22 0521  NA 135 138 139 147* 135 141  K 4.4 4.4 3.9 2.2* 4.4 3.4*  CL 100 103  --   --  101 101  CO2 22  --   --   --  20* 29  GLUCOSE 172* 171*  --   --  130* 113*  BUN 15 20  --   --  16 14   CREATININE 1.05* 0.80  --   --  0.73 0.76  CALCIUM 9.3  --   --   --  8.8* 8.9  MG  --   --   --   --  1.8  --     CBC: Recent Labs  Lab 06/21/22 1640 06/21/22 1646 06/21/22 1846 06/21/22 2222 06/22/22 0331 06/23/22 0521  WBC 7.8  --   --   --  8.6 5.6  NEUTROABS 5.0  --   --   --   --   --   HGB 14.8 15.6* 13.9 7.8* 14.2 14.2  HCT 45.3 46.0 41.0 23.0* 45.0 43.9  MCV 97.4  --   --   --  101.8* 97.1  PLT 217  --   --   --  182 170    LFT Recent Labs  Lab 06/21/22 1640 06/23/22 0521  AST 36 35  ALT 25 27  ALKPHOS 78 65  BILITOT 1.5* 1.9*  PROT 7.0 6.1*  ALBUMIN 4.0 3.4*     Antibiotics: Anti-infectives (From admission, onward)    Start     Dose/Rate Route Frequency Ordered Stop   06/23/22 1215  cefTRIAXone (ROCEPHIN) 2 g in sodium chloride 0.9 % 100 mL IVPB        2 g 200 mL/hr over 30 Minutes Intravenous Every 24 hours 06/23/22 1124 06/27/22 2359        DVT prophylaxis: Heparin  Code Status: DNR  Family Communication: Discussed with patient's son on phone 06/22/2022   CONSULTS neurology   Objective    Physical Examination:   General: Appears drowsy Cardiovascular: S1-S2, regular Respiratory: Clear to auscultation bilaterally Abdomen: Soft, nontender, no organomegaly Extremities: No edema in the lower extremities Neurologic: Somnolent but arousable to verbal stimuli   Status is: Inpatient:             Oswald Hillock   [facility] If 7PM-7AM, please contact night-coverage at [email or web address], Office  [phone]   06/23/2022, 4:08 PM  LOS: 1 day

## 2022-06-24 ENCOUNTER — Inpatient Hospital Stay: Payer: Self-pay

## 2022-06-24 DIAGNOSIS — R569 Unspecified convulsions: Secondary | ICD-10-CM | POA: Diagnosis not present

## 2022-06-24 DIAGNOSIS — J9602 Acute respiratory failure with hypercapnia: Secondary | ICD-10-CM | POA: Diagnosis not present

## 2022-06-24 DIAGNOSIS — R400 Somnolence: Secondary | ICD-10-CM | POA: Diagnosis not present

## 2022-06-24 LAB — HEPARIN LEVEL (UNFRACTIONATED)
Heparin Unfractionated: >1.1 IU/mL — ABNORMAL HIGH (ref 0.30–0.70)
Heparin Unfractionated: 0.92 [IU]/mL — ABNORMAL HIGH (ref 0.30–0.70)

## 2022-06-24 LAB — GLUCOSE, CAPILLARY
Glucose-Capillary: 105 mg/dL — ABNORMAL HIGH (ref 70–99)
Glucose-Capillary: 106 mg/dL — ABNORMAL HIGH (ref 70–99)
Glucose-Capillary: 108 mg/dL — ABNORMAL HIGH (ref 70–99)
Glucose-Capillary: 132 mg/dL — ABNORMAL HIGH (ref 70–99)
Glucose-Capillary: 139 mg/dL — ABNORMAL HIGH (ref 70–99)
Glucose-Capillary: 213 mg/dL — ABNORMAL HIGH (ref 70–99)

## 2022-06-24 LAB — BASIC METABOLIC PANEL
Anion gap: 14 (ref 5–15)
Anion gap: 12 (ref 5–15)
BUN: 17 mg/dL (ref 8–23)
BUN: 19 mg/dL (ref 8–23)
CO2: 34 mmol/L — ABNORMAL HIGH (ref 22–32)
CO2: 33 mmol/L — ABNORMAL HIGH (ref 22–32)
Calcium: 9 mg/dL (ref 8.9–10.3)
Calcium: 9.3 mg/dL (ref 8.9–10.3)
Chloride: 96 mmol/L — ABNORMAL LOW (ref 98–111)
Chloride: 97 mmol/L — ABNORMAL LOW (ref 98–111)
Creatinine, Ser: 0.9 mg/dL (ref 0.44–1.00)
Creatinine, Ser: 0.81 mg/dL (ref 0.44–1.00)
GFR, Estimated: >60 mL/min (ref >60)
GFR, Estimated: >60 mL/min (ref >60)
Glucose, Bld: 140 mg/dL — ABNORMAL HIGH (ref 70–99)
Glucose, Bld: 170 mg/dL — ABNORMAL HIGH (ref 70–99)
Potassium: 2.9 mmol/L — ABNORMAL LOW (ref 3.5–5.1)
Potassium: 4.2 mmol/L (ref 3.5–5.1)
Sodium: 144 mmol/L (ref 135–145)
Sodium: 142 mmol/L (ref 135–145)

## 2022-06-24 LAB — CBC
HCT: 46.9 % — ABNORMAL HIGH (ref 36.0–46.0)
Hemoglobin: 14.9 g/dL (ref 12.0–15.0)
MCH: 31.2 pg (ref 26.0–34.0)
MCHC: 31.8 g/dL (ref 30.0–36.0)
MCV: 98.3 fL (ref 80.0–100.0)
Platelets: 166 10*3/uL (ref 150–400)
RBC: 4.77 MIL/uL (ref 3.87–5.11)
RDW: 14.1 % (ref 11.5–15.5)
WBC: 3.9 10*3/uL — ABNORMAL LOW (ref 4.0–10.5)
nRBC: 0 % (ref 0.0–0.2)

## 2022-06-24 LAB — URINE CULTURE: Culture: >=100000 — AB

## 2022-06-24 LAB — APTT
aPTT: 199 seconds (ref 24–36)
aPTT: 110 seconds — ABNORMAL HIGH (ref 24–36)

## 2022-06-24 LAB — MAGNESIUM: Magnesium: 1.7 mg/dL (ref 1.7–2.4)

## 2022-06-24 MED ORDER — CEPHALEXIN 500 MG PO CAPS
500.0000 mg | ORAL_CAPSULE | Freq: Two times a day (BID) | ORAL | Status: DC
  Administered 2022-06-24 – 2022-06-27 (×6): 500 mg via ORAL
  Filled 2022-06-24 (×6): qty 1

## 2022-06-24 MED ORDER — POTASSIUM CHLORIDE 20 MEQ PO PACK
40.0000 meq | PACK | ORAL | Status: AC
  Administered 2022-06-24 (×2): 40 meq via ORAL
  Filled 2022-06-24 (×2): qty 2

## 2022-06-24 MED ORDER — POTASSIUM CHLORIDE 20 MEQ PO PACK
40.0000 meq | PACK | Freq: Once | ORAL | Status: AC
  Administered 2022-06-24: 40 meq via ORAL
  Filled 2022-06-24: qty 2

## 2022-06-24 MED ORDER — POTASSIUM CHLORIDE 10 MEQ/100ML IV SOLN: 10.0000 meq | INTRAVENOUS | Status: DC

## 2022-06-24 MED ORDER — HEPARIN (PORCINE) 25000 UT/250ML-% IV SOLN
850.0000 [IU]/h | INTRAVENOUS | Status: AC
  Administered 2022-06-25: 850 [IU]/h via INTRAVENOUS
  Filled 2022-06-24: qty 250

## 2022-06-24 MED ORDER — LEVETIRACETAM 500 MG PO TABS
500.0000 mg | ORAL_TABLET | Freq: Two times a day (BID) | ORAL | Status: DC
  Administered 2022-06-24 – 2022-06-27 (×6): 500 mg via ORAL
  Filled 2022-06-24 (×6): qty 1

## 2022-06-24 MED ORDER — HALOPERIDOL LACTATE 5 MG/ML IJ SOLN
2.0000 mg | Freq: Once | INTRAMUSCULAR | Status: AC
  Administered 2022-06-24: 2 mg via INTRAMUSCULAR
  Filled 2022-06-24: qty 1

## 2022-06-24 MED ORDER — HALOPERIDOL 0.5 MG PO TABS: 2.0000 mg | ORAL_TABLET | Freq: Once | ORAL | Status: AC

## 2022-06-24 MED ORDER — MAGNESIUM SULFATE 2 GM/50ML IV SOLN
2.0000 g | Freq: Once | INTRAVENOUS | Status: AC
  Administered 2022-06-24: 2 g via INTRAVENOUS
  Filled 2022-06-24: qty 50

## 2022-06-24 NOTE — Progress Notes (Signed)
[facility]  PROGRESS NOTE  Jhaniya Briski [name] PXT:[id number] DOB: [birth year] DOA: 06/21/2022 PCP: Crecencio Mc, MD   Brief HPI:    89 year old female with medical history of HFpEF, pulmonary hypertension, severe MR status post MitraClip in June 2022, diabetes mellitus type 2, stroke, hypertension, atrial fibrillation on Eliquis, hypertension, hyperlipidemia.  Patient was admitted in August 2023 for left MCA infarction/M3 occlusion s/p TNK and mechanical thrombectomy.  She did have small right frontal hematoma which was likely hemorrhagic transformation from the TNK.  Came to ED from nursing home with right-sided gaze, right-sided weakness and right-sided twitching and unresponsiveness.  She had 1 episode of emesis with EMS and was given Zofran.  In the ED she was found to be tachypneic with O2 sats in 80s on room air, she was placed on nonrebreather and subsequently placed on BiPAP.  Chest x-ray showed cardiomegaly with vascular congestion question pulmonary edema. Neurology was consulted, CT head was negative.  Patient received Ativan and Keppra.  EEG showed cortical dysfunction arising from left frontal temporal region likely secondary to  underlying structural abnormality, postictal state. Neurology discussed with patient's family and patient was made DNR.   Subjective   Patient became agitated this morning, requiring Haldol.  Has only 1 IV access and IV heparin is going through that.  No other IV available.  PICC line ordered.  Patient has very very poor p.o. intake, started on dysphagia 3 diet.   Assessment/Plan:     Focal status epilepticus secondary to poststroke epilepsy -Patient presented with right-sided gaze, right-sided weakness and twitching with unresponsiveness -CT head was negative for acute finding -Neurology felt no further imaging needed, EEG showed cortical dysfunction arising from left frontotemporal region due to underlying structural abnormality, postictal  state. -Patient received Ativan in the ED also received IV Keppra -Dose of Keppra has been changed to  500 milligram IV twice daily  due to hypersomnolence   Acute hypercapnic respiratory failure -Probable acute on chronic HFpEF -Patient was unresponsive on arrival to ED with O2 sats in 80s on room air -Initially placed on nonrebreather, switched to BiPAP --BNP 545.3 -She did receive 1 dose of Lasix 40 mg IV yesterday -Breathing seems to have improved  Hypokalemia -Potassium is 2.9 -IV potassium was ordered however no IV access -Will give 40 mEq of oral KCl x 3    Severe MR s/p MitraClip June 2022 -Echocardiogram done in August 2023 showed moderate MR   Diabetes mellitus type 2 -Hemoglobin A1c 6.6 in August 2023 -Continue sliding scale insulin with NovoLog   Chronic atrial fibrillation -Patient was taking Eliquis at home -Dr. Marlowe Sax discussed with neurology, patient started on IV heparin   Hypertension -Continue as needed IV hydralazine    UTI -Patient has abnormal UA -Will obtain urine culture -Urine culture grew Citrobacter and E. Coli, sensitive to cefazolin -Will switch from ceftriaxone to p.o. Keflex 500 mg twice daily      Medications     insulin aspart  0-9 Units Subcutaneous Q4H   potassium chloride  40 mEq Oral Once     Data Reviewed:   CBG:  Recent Labs  Lab 06/23/22 2002 06/24/22 0007 06/24/22 0419 06/24/22 0727 06/24/22 1128  GLUCAP 134* 106* 132* 105* 139*    SpO2: 95 % O2 Flow Rate (L/min): 10 L/min FiO2 (%): 50 %    Vitals:   06/24/22 0411 06/24/22 0719 06/24/22 1120 06/24/22 1614  BP: 109/67 (!) 153/97 (!) 153/82   Pulse: 84 (!) 58 (!)  45 87  Resp: (!) 21 19    Temp: 98.7 F (37.1 C) 98.1 F (36.7 C) 97.7 F (36.5 C) 97.9 F (36.6 C)  TempSrc: Axillary Axillary Axillary Oral  SpO2: 94% 95% 91% 95%  Weight:      Height:          Data Reviewed:  Basic Metabolic Panel: Recent Labs  Lab 06/21/22 1640  06/21/22 1646 06/21/22 1846 06/21/22 2222 06/22/22 0331 06/23/22 0521 06/24/22 1022  NA 135 138 139 147* 135 141 144  K 4.4 4.4 3.9 2.2* 4.4 3.4* 2.9*  CL 100 103  --   --  101 101 96*  CO2 22  --   --   --  20* 29 34*  GLUCOSE 172* 171*  --   --  130* 113* 140*  BUN 15 20  --   --  '16 14 17  '$ CREATININE 1.05* 0.80  --   --  0.73 0.76 0.90  CALCIUM 9.3  --   --   --  8.8* 8.9 9.0  MG  --   --   --   --  1.8  --   --     CBC: Recent Labs  Lab 06/21/22 1640 06/21/22 1646 06/21/22 1846 06/21/22 2222 06/22/22 0331 06/23/22 0521 06/24/22 1022  WBC 7.8  --   --   --  8.6 5.6 3.9*  NEUTROABS 5.0  --   --   --   --   --   --   HGB 14.8   < > 13.9 7.8* 14.2 14.2 14.9  HCT 45.3   < > 41.0 23.0* 45.0 43.9 46.9*  MCV 97.4  --   --   --  101.8* 97.1 98.3  PLT 217  --   --   --  182 170 166   < > = values in this interval not displayed.    LFT Recent Labs  Lab 06/21/22 1640 06/23/22 0521  AST 36 35  ALT 25 27  ALKPHOS 78 65  BILITOT 1.5* 1.9*  PROT 7.0 6.1*  ALBUMIN 4.0 3.4*     Antibiotics: Anti-infectives (From admission, onward)    Start     Dose/Rate Route Frequency Ordered Stop   06/23/22 1215  cefTRIAXone (ROCEPHIN) 2 g in sodium chloride 0.9 % 100 mL IVPB        2 g 200 mL/hr over 30 Minutes Intravenous Every 24 hours 06/23/22 1124 06/27/22 2359        DVT prophylaxis: Heparin  Code Status: DNR  Family Communication: Discussed with patient's daughter at bedside   CONSULTS neurology   Objective    Physical Examination:   Appears in no acute distress Heart S1-S2, regular Clear to auscultation bilaterally Abdomen is soft, nontender, no organomegaly Alert, confused   Status is: Inpatient:             Oswald Hillock   [facility] If 7PM-7AM, please contact night-coverage at [email or web address], Office  [phone]   06/24/2022, 4:39 PM  LOS: 2 days

## 2022-06-24 NOTE — Evaluation (Signed)
Clinical/Bedside Swallow Evaluation Patient Details  Name: Linda [name] MRN: [id number] Date of Birth: 31-Jan-1934  Today's Date: 06/24/2022 Time: SLP Start Time (ACUTE ONLY): 0955 SLP Stop Time (ACUTE ONLY): 32 SLP Time Calculation (min) (ACUTE ONLY): 25 min  Past Medical History:  Past Medical History:  Diagnosis Date   Anxiety    Arrhythmia    paroxysmal atrial fibrillation   CHF (congestive heart failure) (HCC)    Colon polyp    Diverticulitis    Dysrhythmia    GERD (gastroesophageal reflux disease)    Hard of hearing    Hoarseness    Hyperlipidemia    Hypertension    Kidney stone    Motion sickness    Parathyroid disease ([place])    Parathyroidectomy    PONV (postoperative nausea and vomiting)    Pre-diabetes    PVC (premature ventricular contraction)    Skin cancer    Squamous cell carcinoma of skin 07/19/2021   right lower pretibia lateral, EDC   Squamous cell carcinoma of skin 10/13/2014   R ant neck - KA pattern   Squamous cell carcinoma of skin 09/15/2021   Left dorsal hand, shaved down to fat at time of biopsy so should already be removed.  Will observe for recurrence.   Stroke [facility])    TIA (transient ischemic attack) 04/2018   no deficitis   UTI (lower urinary tract infection)    Varicose veins of both lower extremities    Wears hearing aid in both ears    Past Surgical History:  Past Surgical History:  Procedure Laterality Date   ABDOMINAL HYSTERECTOMY     APPENDECTOMY     CATARACT EXTRACTION W/PHACO Left 03/05/2019   Procedure: CATARACT EXTRACTION PHACO AND INTRAOCULAR LENS PLACEMENT ([place])   0:57 15.6% 9.05;  Surgeon: Leandrew Koyanagi, MD;  Location: [place];  Service: Ophthalmology;  Laterality: Left;  Diabetic - diet cotrolled   CATARACT EXTRACTION W/PHACO Right 04/09/2019   Procedure: CATARACT EXTRACTION PHACO AND INTRAOCULAR LENS PLACEMENT (IOC) RIGHT DIABETIC 00:47.6  15.9%  7.60;  Surgeon: Leandrew Koyanagi, MD;  Location: [place];  Service: Ophthalmology;  Laterality: Right;   CHOLECYSTECTOMY  2002   ESOPHAGOGASTRODUODENOSCOPY (EGD) WITH PROPOFOL N/A 12/17/2019   Procedure: ESOPHAGOGASTRODUODENOSCOPY (EGD) WITH PROPOFOL;  Surgeon: Robert Bellow, MD;  Location: [facility];  Service: Endoscopy;  Laterality: N/A;   IR CT HEAD LTD  02/08/2022   IR PERCUTANEOUS ART THROMBECTOMY/INFUSION INTRACRANIAL INC DIAG ANGIO  02/08/2022   LITHOTRIPSY     MITRAL VALVE REPAIR  12/14/2020   PARATHYROIDECTOMY  2004   1 removed   RADIOLOGY WITH ANESTHESIA N/A 02/08/2022   Procedure: IR WITH ANESTHESIA;  Surgeon: Luanne Bras, MD;  Location: [place];  Service: Radiology;  Laterality: N/A;   ROTATOR CUFF REPAIR  2002   TEE WITHOUT CARDIOVERSION N/A 09/29/2020   Procedure: TRANSESOPHAGEAL ECHOCARDIOGRAM (TEE);  Surgeon: Teodoro Spray, MD;  Location: [facility];  Service: Cardiovascular;  Laterality: N/A;   TONSILLECTOMY  1956   TONSILLECTOMY     TOTAL ABDOMINAL HYSTERECTOMY W/ BILATERAL SALPINGOOPHORECTOMY  1984   VEIN LIGATION AND STRIPPING     HPI:  Linda [name] is a 89 y.o. female with medical history significant of HFpEF, pulmonary hypertension, severe MR status post MitraClip June 2022, type 2 diabetes, stroke, hypertension, A-fib on Eliquis, hypertension, hyperlipidemia, epistaxis.  Patient was admitted in August 2023 for left MCA infarction/M3 occlusion status post TNK and mechanical thrombectomy.  She did have a small  right frontal hematoma which was likely hemorrhagic transformation from the TNK. She presented to the ED today from her nursing home with right-sided gaze, right-sided weakness, right-sided twitching, and unresponsiveness.  She had 1 episode of emesis with EMS and was given Zofran.  In the ED, patient noted to be tachypneic and oxygen saturation in the 80s on room air.  Initially placed on nonrebreather.  ABG showing pH 7.27, pCO2 62, pO2 247. Subsequently placed on BiPAP.  Labs showing no  leukocytosis, glucose 172, creatinine 1.0 (baseline 0.6-0.8), T. bili 1.5 (slightly elevated on previous labs as well) and remainder of LFTs normal, UA and UDS pending, blood ethanol level undetectable.  Chest x-ray showing cardiomegaly with vascular congestion and question of septal thickening/pulmonary edema.  CT head negative for acute finding. 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    Assessment / Plan / Recommendation  Clinical Impression  Pt seen for clinical swallowing evaluation with mentation waxing/waning during assessment requiring min verbal/tactile cues from SLP.  Pt confused, but able to follow simple commands to complete CSE.   Pt with cognitively-impaired swallow c/b oral holding, decreased bolus manipulation (improved with cueing) and delay in the initiation of the swallow.  Impaired mastication noted with solids, but pt was able to propel bolus into pharynx without pocketing or exhibiting oral residue.  Alternated solids/liquids and provided min-mod verbal/tactile cueing during oral consumption without overt s/sx of aspiration noted.  Recommend initiating a dysphagia 3/thin liquid diet with FULL precautions and swallowing precautions implemented during intake IF PT IS ALERT during all meals/snacks.  ST will f/u for diet tolerance/advancement during acute stay.  Thank you for this consult. SLP Visit Diagnosis: Dysphagia, unspecified (R13.10)    Aspiration Risk  Mild aspiration risk    Diet Recommendation   Dysphagia 3/thin (if mentation allows)  Medication Administration: Crushed with puree    Other  Recommendations Oral Care Recommendations: Oral care BID;Staff/trained caregiver to provide oral care    Recommendations for follow up therapy are one component of a multi-disciplinary discharge planning process, led by the attending physician.   Recommendations may be updated based on patient status, additional functional criteria and insurance authorization.  Follow up Recommendations Skilled nursing-short term rehab (<3 hours/day)      Assistance Recommended at Discharge    Functional Status Assessment Patient has had a recent decline in their functional status and demonstrates the ability to make significant improvements in function in a reasonable and predictable amount of time.  Frequency and Duration min 2x/week  1 week       Prognosis Prognosis for Safe Diet Advancement: Good Barriers to Reach Goals: Cognitive deficits      Swallow Study   General Date of Onset: 06/21/22 HPI: Linda [name] is a 89 y.o. female with medical history significant of HFpEF, pulmonary hypertension, severe MR status post MitraClip June 2022, type 2 diabetes, stroke, hypertension, A-fib on Eliquis, hypertension, hyperlipidemia, epistaxis.  Patient was admitted in August 2023 for left MCA infarction/M3 occlusion status post TNK and mechanical thrombectomy.  She did have a small right frontal hematoma which was likely hemorrhagic transformation from the TNK. She presented to the ED today from her nursing home with right-sided gaze, right-sided weakness, right-sided twitching, and unresponsiveness.  She had 1 episode of emesis with EMS and was given Zofran.  In the ED, patient noted to be tachypneic and oxygen saturation in the 80s  on room air.  Initially placed on nonrebreather.  ABG showing pH 7.27, pCO2 62, pO2 247. Subsequently placed on BiPAP.  Labs showing no leukocytosis, glucose 172, creatinine 1.0 (baseline 0.6-0.8), T. bili 1.5 (slightly elevated on previous labs as well) and remainder of LFTs normal, UA and UDS pending, blood ethanol level undetectable.  Chest x-ray showing cardiomegaly with vascular congestion and question of septal thickening/pulmonary edema.  CT head negative for acute finding. 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 Type of Study: Bedside Swallow Evaluation Previous Swallow Assessment: 8.28.23 f/u dysphagia tx where pt was progressed to Reg/thin liquids Diet Prior to this Study: NPO Temperature Spikes Noted: No Respiratory Status: Other (comment) (HFNC) History of Recent Intubation: No Behavior/Cognition: Confused;Lethargic/Drowsy Oral Cavity Assessment: Within Functional Limits Oral Care Completed by SLP: Yes Oral Cavity - Dentition: Adequate natural dentition Self-Feeding Abilities: Needs assist;Needs set up Patient Positioning: Upright in bed Baseline Vocal Quality: Low vocal intensity;Other (comment) (min hoarse (d/t xerostomia?);improved with hydration) Volitional Cough: Cognitively unable to elicit Volitional Swallow: Unable to elicit    Oral/Motor/Sensory Function Overall Oral Motor/Sensory Function: Other (comment) (DTA)   Ice Chips Ice chips: Impaired Presentation: Spoon Oral Phase Impairments: Impaired mastication Oral Phase Functional Implications: Oral holding   Thin Liquid Thin Liquid: Impaired Presentation: Straw Oral Phase Functional Implications: Oral holding Pharyngeal  Phase Impairments: Suspected delayed Swallow    Nectar Thick Nectar Thick Liquid: Not tested   Honey Thick Honey Thick Liquid: Not tested   Puree Puree: Impaired Presentation: Spoon Oral Phase Impairments: Reduced lingual movement/coordination Oral Phase Functional Implications: Oral holding Pharyngeal Phase Impairments: Suspected delayed Swallow   Solid     Solid: Impaired Presentation: Spoon Oral Phase Impairments: Impaired mastication Oral Phase Functional Implications: Impaired mastication Pharyngeal Phase Impairments: Suspected delayed Swallow      Elvina Sidle, M.S., CCC-SLP 06/24/2022,10:46 AM

## 2022-06-24 NOTE — Progress Notes (Addendum)
ANTICOAGULATION CONSULT NOTE - Follow Up Consult  Pharmacy Consult for Heparin Indication: atrial fibrillation  Allergies  Allergen Reactions   Barium Iodide     Unknown   Lipitor [Atorvastatin] Other (See Comments)    Myalgias    Statins Other (See Comments)    Myalgias    Crestor [Rosuvastatin Calcium] Other (See Comments)    Myalgias    Penicillins Rash   Sulfa Antibiotics Rash   Sulfonamide Derivatives Rash    Patient Measurements: Height: 5' 3.5" (161.3 cm) Weight: 73.9 kg (163 lb) IBW/kg (Calculated) : 53.55 Heparin Dosing Weight: 69 kg  Vital Signs: Temp: 97.7 F (36.5 C) (01/06 1120) Temp Source: Axillary (01/06 1120) BP: 153/82 (01/06 1120) Pulse Rate: 45 (01/06 1120)  Labs: Recent Labs    06/21/22 1640 06/21/22 1646 06/22/22 0331 06/22/22 1330 06/22/22 2211 06/23/22 0521 06/24/22 1022  HGB 14.8   < > 14.2  --   --  14.2 14.9  HCT 45.3   < > 45.0  --   --  43.9 46.9*  PLT 217  --  182  --   --  170 166  APTT 33  --   --    < > 112* 63* 199*  LABPROT 17.6*  --   --   --   --   --   --   INR 1.5*  --   --   --   --   --   --   HEPARINUNFRC  --   --   --    < > >1.10* 0.80* >1.10*  CREATININE 1.05*   < > 0.73  --   --  0.76 0.90   < > = values in this interval not displayed.    Estimated Creatinine Clearance: 43.7 mL/min (by C-G formula based on SCr of 0.9 mg/dL).   Medications:  Scheduled:   furosemide  40 mg Intravenous Daily   insulin aspart  0-9 Units Subcutaneous Q4H   Infusions:   cefTRIAXone (ROCEPHIN)  IV 2 g (06/23/22 1310)   heparin     levETIRAcetam 500 mg (06/24/22 0805)   potassium chloride      Assessment: 89 yo F on apixaban PTA for afib, now held for seizures. Last PTA dose on 1/3. Pharmacy consulted for heparin dosing.   Heparin level today is elevated at >1.10, likely due to apixaban.  aPTT is supratherapeutic at 199 on 1050 units/hr.  IV heparin is running in right peripheral IV, and labs appear to have been drawn  appropriately from opposite arm.  Hgb 14.9, plt 166 - stable. Scr 0.9 up slightly from baseline (~0.7), total bili 1.9.  No signs/symptoms of bleeding noted per RN.  Called and spoke with lab regarding critical result, aPTT initially resulted at > 200 but was re-processed and then resulted at 199.  Advised RN to stop heparin and hold for ~1 hour, prior to resuming at reduced infusion rate. Per RN, IV heparin was stopped at ~12:40pm.   Goal of Therapy:  Heparin level 0.3-0.7 units/ml aPTT 66-102 seconds Monitor platelets by anticoagulation protocol: Yes   Plan:  Hold IV heparin for ~ 1 hour, then resume infusion at 950 units/hr. Check ~8 hr heparin level, aPTT - will follow aPTT until correlating with heparin level.  Daily CBC, heparin level, aPTT.  Monitor for signs/symptoms of bleeding. Follow-up ability to resume oral anticoagulation.   Vance Peper, PharmD PGY-2 Pharmacy Resident Phone [phone] 06/24/2022 1:16 PM   Please check AMION for all  [facility] phone numbers After 10:00 PM, call [place] [phone]

## 2022-06-24 NOTE — Progress Notes (Signed)
Dear Doctor: Darrick Meigs This patient has been identified as a candidate for PICC for the following reason (s): poor veins/poor circulatory system (CHF, COPD, emphysema, diabetes, steroid use, IV drug abuse, etc.) If you agree, please write an order for the indicated device. For any questions contact the Vascular Access Team at [phone] if no answer, please leave a message.  Thank you for supporting the early vascular access assessment program.

## 2022-06-24 NOTE — Progress Notes (Signed)
Neurology Progress Note  Brief HPI: Linda [name] is a 89 y.o. female with a PMHx of DM2, HTN, HLD, Afib on eliquis presenting with a right-sided gaze and right-sided weakness as well as right-sided twitching.  She currently resides at [facility] in [place] and was found by staff with a right-sided gaze, right-sided weakness, and unresponsive.  According to documentation obtained from the nursing home she is currently taking all of her medications including Eliquis.  She was last seen normal at 1530 by staff. Per EMS she did have 1 episode of emesis and received 4 mg of Zofran prior to her arrival to the ED.  Upon arrival to the bridge blood pressure is 155/96, she is in A-fib, and glucose is 181.  On arrival to [facility] ED she was noted to be in focal status epilepticus with semiology consistent with focus of her prior left MCA territory stroke.  This resolved with administration of Keppra and Ativan   Subjective: Patient seen in room with no family at the bedside.  She is more awake and alert this morning, states she wants to go home.  Per nursing, she has been much more awake and restless and has been trying to get out of bed all night.  Exam: Current vital signs: BP (!) 153/97 (BP Location: Left Arm)   Pulse (!) 58   Temp 98.1 F (36.7 C) (Axillary)   Resp 19   Ht 5' 3.5" (1.613 m)   Wt 73.9 kg   SpO2 95%   BMI 28.42 kg/m  Vital signs in last 24 hours: Temp:  [97.5 F (36.4 C)-98.7 F (37.1 C)] 98.1 F (36.7 C) (01/06 0719) Pulse Rate:  [43-99] 58 (01/06 0719) Resp:  [16-35] 19 (01/06 0719) BP: (109-172)/(67-124) 153/97 (01/06 0719) SpO2:  [91 %-100 %] 95 % (01/06 0719)  Gen: In bed, NAD Resp: Respirations regular and unlabored  Neuro: Mental Status: Alert and interactive today.  Able to state name but then perseverates on "[place]" as answer to all further questions.  Intermittently follows simple commands.  Will speak in full sentences but is clearly  confused Cranial Nerves: Pupils equal round and reactive, EOMI, face symmetrical, hearing intact to voice, voice slightly dysarthric Motor/sensory: Able to move all extremities purposefully with antigravity strength    Pertinent Labs: BNP 545.3 Urinalysis with trace leukocytes, positive nitrates, rare bacteria  Imaging Reviewed: Head CT - No acute intracranial process. ASPECTS is 10.   Repeat head CT 1/4: no hemorrhage or new infarct  Routine EEG- This study is suggestive of cortical dysfunction arising from left frontotemporal region likely secondary to underlying structural abnormality, post-ictal state.  No seizures or epileptiform discharges were seen throughout the recording.   LTM EEG read 06/21/2022 2038 to 06/22/2022 9509  - Continuous slow, left frontotemporal region This study is suggestive of cortical dysfunction arising from left frontotemporal region likely secondary to underlying structural abnormality, post-ictal state.  No seizures or epileptiform discharges were seen throughout the recording.   CXR 06/21/2022 Cardiomegaly with vascular congestion and question of septal thickening/pulmonary edema.    Assessment:  89 y.o. female with a PMHx of DM2, HTN, HLD, Afib on eliquis presenting with a right-sided gaze and right-sided weakness as well as right-sided twitching.  She currently resides at [facility] in [place] and was found by staff with a right-sided gaze, right-sided weakness, right-sided twitching, and unresponsive.  Gaze deviation and twitching improved with 2 mg of Ativan and 3000 mg of Keppra.  Patient is more alert this morning and is able to move bilateral upper and lower extremities with good antigravity strength.  However, she is able to state her name but cannot answer other questions.  Impression:  Poststroke epilepsy Urinary tract infection contributing to lowered seizure threshold  Recommendations: #Focal status epilepticus secondary to  post stroke epilepsy - Continue Keppra 500 mg daily - Appreciate UTI treatment per primary team - Expect continual gradual recovery from postictal Todd's phenomenon, appears to be able to move right upper and lower extremities well today - Ambulatory referral to [facility] placed for close follow-up.  In discharge instructions family/patient should also be provided with the number to call for an appointment: [phone] - Neurology will be available as needed, please do reach out if any further concern for seizure activity or if any other questions or concerns arise  # Code status - DNR, but okay to intubate for seizure control in the future if needed  Patient seen and examined by NP/APP with MD. MD to update note as needed.   [place] , MSN, AGACNP-BC [facility] See Amion for schedule and pager information 06/24/2022 9:19 AM  Attending Neurologist's note:  I personally saw this patient, gathering history, performing a neurologic examination, reviewing relevant labs, personally reviewing relevant imaging previously, and formulated the assessment and plan, adding the note above for completeness and clarity to accurately reflect my thoughts  Lesleigh Noe MD-PhD [facility] [phone]  Available 7 AM to 7 PM, outside these hours please contact Neurologist on call listed on AMION

## 2022-06-24 NOTE — Progress Notes (Signed)
Spoke with son and daughter in law re PICC line placement.  Unable to reach the dtr.  Son states family in agreement that they do not want the PICC line in, declining placement at this time.  Will cancel the PICC.  If family is agreeable and status changes,please reorder the PICC.

## 2022-06-25 DIAGNOSIS — R400 Somnolence: Secondary | ICD-10-CM | POA: Diagnosis not present

## 2022-06-25 DIAGNOSIS — J9602 Acute respiratory failure with hypercapnia: Secondary | ICD-10-CM | POA: Diagnosis not present

## 2022-06-25 DIAGNOSIS — R569 Unspecified convulsions: Secondary | ICD-10-CM | POA: Diagnosis not present

## 2022-06-25 LAB — BASIC METABOLIC PANEL
Anion gap: 15 (ref 5–15)
BUN: 21 mg/dL (ref 8–23)
CO2: 23 mmol/L (ref 22–32)
Calcium: 9.5 mg/dL (ref 8.9–10.3)
Chloride: 100 mmol/L (ref 98–111)
Creatinine, Ser: 0.86 mg/dL (ref 0.44–1.00)
GFR, Estimated: >60 mL/min (ref >60)
Glucose, Bld: 241 mg/dL — ABNORMAL HIGH (ref 70–99)
Potassium: 4.3 mmol/L (ref 3.5–5.1)
Sodium: 138 mmol/L (ref 135–145)

## 2022-06-25 LAB — GLUCOSE, CAPILLARY
Glucose-Capillary: 152 mg/dL — ABNORMAL HIGH (ref 70–99)
Glucose-Capillary: 159 mg/dL — ABNORMAL HIGH (ref 70–99)
Glucose-Capillary: 179 mg/dL — ABNORMAL HIGH (ref 70–99)
Glucose-Capillary: 191 mg/dL — ABNORMAL HIGH (ref 70–99)
Glucose-Capillary: 228 mg/dL — ABNORMAL HIGH (ref 70–99)
Glucose-Capillary: 246 mg/dL — ABNORMAL HIGH (ref 70–99)

## 2022-06-25 LAB — APTT: aPTT: 65 seconds — ABNORMAL HIGH (ref 24–36)

## 2022-06-25 MED ORDER — APIXABAN 5 MG PO TABS
5.0000 mg | ORAL_TABLET | Freq: Two times a day (BID) | ORAL | Status: DC
  Administered 2022-06-25 – 2022-06-27 (×5): 5 mg via ORAL
  Filled 2022-06-25 (×5): qty 1

## 2022-06-25 NOTE — Progress Notes (Addendum)
ANTICOAGULATION CONSULT NOTE - Follow Up Consult  Pharmacy Consult for Heparin >> Apixaban Indication: atrial fibrillation  Allergies  Allergen Reactions   Barium Iodide     Unknown   Lipitor [Atorvastatin] Other (See Comments)    Myalgias    Statins Other (See Comments)    Myalgias    Crestor [Rosuvastatin Calcium] Other (See Comments)    Myalgias    Penicillins Rash   Sulfa Antibiotics Rash   Sulfonamide Derivatives Rash    Patient Measurements: Height: 5' 3.5" (161.3 cm) Weight: 73.9 kg (163 lb) IBW/kg (Calculated) : 53.55 Heparin Dosing Weight: 69 kg  Vital Signs: Temp: 98.1 F (36.7 C) (01/07 0426) Temp Source: Oral (01/07 0426) BP: 159/87 (01/07 0426) Pulse Rate: 102 (01/07 0426)  Labs: Recent Labs    06/23/22 0521 06/24/22 1022 06/24/22 2153 06/25/22 0311  HGB 14.2 14.9  --   --   HCT 43.9 46.9*  --   --   PLT 170 166  --   --   APTT 63* 199* 110* 65*  HEPARINUNFRC 0.80* >1.10* 0.92*  --   CREATININE 0.76 0.90 0.81  --     Estimated Creatinine Clearance: 48.6 mL/min (by C-G formula based on SCr of 0.81 mg/dL).   Medications:  Scheduled:   cephALEXin  500 mg Oral Q12H   insulin aspart  0-9 Units Subcutaneous Q4H   levETIRAcetam  500 mg Oral BID   Infusions:   heparin 850 Units/hr (06/25/22 0347)    Assessment: 89 yo F on apixaban PTA for afib, now held for seizures.  Last PTA dose on 1/3.  Pharmacy consulted for heparin dosing.   Per overnight RN report, IV heparin was paused on 1/6 at ~2350 while IV magnesium was given (MD aware).  IV access was lost on 1/7 at ~0300, then re-established and heparin was resumed at 850 units/hr at ~0347.  Labs were drawn at 0311 and aPTT resulted at 65 (likely not an accurate representation, given heparin was paused for ~3 hours prior to level being drawn).  Per RN, IV heparin has been running with no interruption or issues since ~0347, no signs/symptoms of bleeding noted - will re-draw labs ~8 hours from  heparin restart.   Goal of Therapy:  Heparin level 0.3-0.7 units/ml aPTT 66-102 seconds Monitor platelets by anticoagulation protocol: Yes   Plan:  Continue IV heparin at 850 units/hr.  Check ~8 hr heparin level, aPTT - will follow aPTT until correlating with heparin level.  Daily CBC, heparin level, aPTT.  Monitor for signs/symptoms of bleeding. Follow-up ability to resume oral anticoagulation.   ADDENDUM:  Per MD - ok to stop IV heparin and resume PTA apixaban 5 mg PO BID for atrial fibrillation. Continue to monitor CBC and for signs/symptoms of bleeding.    Vance Peper, PharmD PGY-2 Pharmacy Resident Phone [phone] 06/25/2022 7:34 AM   Please check AMION for all [place] phone numbers After 10:00 PM, call [place] [phone]

## 2022-06-25 NOTE — [place] FL2 (Signed)
[place] MEDICAID FL2 LEVEL OF CARE FORM     IDENTIFICATION  Patient Name: Linda [name] Birthdate: June 30, 1933 Sex: female Admission Date (Current Location): 06/21/2022  [facility] and [place] Number:  Herbalist and Address:  [facility], [place] [address] [place], [place] [address]      Provider Number: [id number]  Attending Physician Name and Address:  Oswald Hillock, MD  Relative Name and Phone Number:  Cari Caraway [phone]    Current Level of Care: Hospital Recommended Level of Care: [place] Prior Approval Number:    Date Approved/Denied:   PASRR Number: [id number]  Discharge Plan: SNF    Current Diagnoses: Patient Active Problem List   Diagnosis Date Noted   Seizures ([place]) 06/22/2022   Left middle cerebral artery stroke ([place]) 02/13/2022   Acute ischemic left MCA stroke ([place]) 02/08/2022   Status post stroke 02/08/2022   Middle cerebral artery embolism, left 02/08/2022   Endotracheal tube present    Chronic HFrEF (heart failure with reduced ejection fraction) (HCC)    NSVT (nonsustained ventricular tachycardia) ([place]) 02/06/2022   Hypoxia 02/05/2022   Chronic anticoagulation 02/05/2022   HFrEF (heart failure with reduced ejection fraction) ([place]) 02/05/2022   Pulmonary hypertension ([place]) 02/05/2022   Valvular cardiomyopathy ([place]) 10/22/2021   S/P mitral valve clip implantation 10/05/2021   Varicose veins of both lower extremities 07/13/2021   Injury of right leg 07/06/2021   Right leg pain 07/06/2021   Callus of foot 07/06/2021   Proximal muscle weakness 08/24/2020   Severe mitral regurgitation s/p MitraClip June 2022 07/09/2020   Fatigue 05/29/2020   Diuretic-induced hypokalemia 12/16/2019   Myalgia due to statin 12/15/2019   Acquired thrombophilia ([place]) 12/15/2019   Skin lesion 08/27/2018   Hospital discharge follow-up 05/25/2018   Abnormal chest x-ray 05/25/2018   History of CVA (cerebrovascular  accident) without residual deficits 05/10/2018   Parent-child estrangement nec 02/09/2018   Basal cell carcinoma (BCC) of right lower extremity 08/08/2017   Hematuria, gross 08/08/2017   Extremity atherosclerosis with intermittent claudication (HCC) 08/08/2017   Generalized anxiety disorder 01/31/2017   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   Controlled type 2 diabetes mellitus with microalbuminuria, without long-term current use of insulin ([place]) 01/20/2016   Insomnia secondary to anxiety 10/09/2015   Polycythemia, secondary 07/04/2015   Pre-syncope 07/02/2015   Osteopenia 08/21/2014   Gastritis 05/26/2014   Epistaxis 03/18/2014   GERD (gastroesophageal reflux disease) 03/04/2014   Medicare annual wellness visit, subsequent 01/13/2014   Essential hypertension 11/04/2013   Screening for breast cancer 07/01/2013   Left shoulder pain 03/21/2013   Hoarseness of voice 03/21/2013   Edema 09/05/2010   VENTRICULAR HYPERTROPHY, LEFT 08/16/2010   Hyperlipidemia 06/03/2010   ATRIAL FIBRILLATION 08/13/2009    Orientation RESPIRATION BLADDER Height & Weight        Normal Continent, External catheter Weight: 163 lb (73.9 kg) Height:  5' 3.5" (161.3 cm)  BEHAVIORAL SYMPTOMS/MOOD NEUROLOGICAL BOWEL NUTRITION STATUS      Continent Diet (see dc summary)  AMBULATORY STATUS COMMUNICATION OF NEEDS Skin   Extensive Assist Verbally Normal                       Personal Care Assistance Level of Assistance  Bathing, Feeding, Dressing Bathing Assistance: Maximum assistance Feeding assistance: Limited assistance Dressing Assistance: Maximum assistance     Functional Limitations Info  Sight, Hearing, Speech Sight Info: Adequate Hearing Info: Adequate Speech Info: Adequate  SPECIAL CARE FACTORS FREQUENCY  OT (By licensed OT), PT (By licensed PT)     PT Frequency: 5x week OT Frequency: 5x week            Contractures Contractures Info: Not present    Additional Factors Info   Code Status, Allergies, Insulin Sliding Scale Code Status Info: DNR Allergies Info: Barium Iodide   Lipitor (Atorvastatin)   Statins   Crestor (Rosuvastatin Calcium)   Penicillins   Sulfa Antibiotics   Sulfonamide Derivatives   Insulin Sliding Scale Info: insulin aspart (novoLOG) injection 0-9 Units       Current Medications (06/25/2022):  This is the current hospital active medication list Current Facility-Administered Medications  Medication Dose Route Frequency Provider Last Rate Last Admin   acetaminophen (TYLENOL) tablet 650 mg  650 mg Oral Q6H PRN Shela Leff, MD   650 mg at 06/25/22 0444   Or   acetaminophen (TYLENOL) suppository 650 mg  650 mg Rectal Q6H PRN Shela Leff, MD   650 mg at 06/24/22 0803   apixaban (ELIQUIS) tablet 5 mg  5 mg Oral BID Oswald Hillock, MD   5 mg at 06/25/22 1059   cephALEXin (KEFLEX) capsule 500 mg  500 mg Oral Q12H Oswald Hillock, MD   500 mg at 06/25/22 1059   hydrALAZINE (APRESOLINE) injection 5 mg  5 mg Intravenous Q6H PRN Shela Leff, MD   5 mg at 06/24/22 2123   insulin aspart (novoLOG) injection 0-9 Units  0-9 Units Subcutaneous Q4H Shela Leff, MD   3 Units at 06/25/22 1208   levETIRAcetam (KEPPRA) tablet 500 mg  500 mg Oral BID Oswald Hillock, MD   500 mg at 06/25/22 1058     Discharge Medications: Please see discharge summary for a list of discharge medications.  Relevant Imaging Results:  Relevant Lab Results:   Additional Information    Arius Harnois B Boruch Manuele, LCSWA

## 2022-06-25 NOTE — Progress Notes (Signed)
PT Cancellation Note  Patient Details Name: Linda [name] MRN: [id number] DOB: 1934/05/13   Cancelled Treatment:    Reason Eval/Treat Not Completed: Fatigue/lethargy limiting ability to participate  Received second PT order (pt was evaluated 1/05), however there was some confusion re: whether pt was recommended to return to ALF or to SNF. Attempted to see pt however she had just gone to sleep after a busy day and both RN and family wanted her to rest. Did discuss with family the PT recommendation was for SNF and that OT had not been ordered. Will place OT order today and will plan to see for PT 06/26/22.    [place]  Office [phone]  Rexanne Mano 06/25/2022, 2:21 PM

## 2022-06-25 NOTE — TOC Initial Note (Addendum)
Transition of Care [facility]) - Initial/Assessment Note    Patient Details  Name: Linda [name] MRN: [id number] Date of Birth: 1933-10-23  [facility] CM/SW Contact:    [address], [place] Phone Number: 06/25/2022, 12:41 PM  Clinical Narrative:                 CSW spoke with [name], she states she has some concerns about pt returning to her ALF [name] [phone]) without seeing PT and OT due to not being out of the bed during this admission.[name] states she has already reached out to [employer] (SW at [facility]), has not heard back yet. [name] states pt has been very week and unable to feed herself or ear until last night, [name] states pt would need to complete her ADL's. [name] states there is SNF at the ALF and she would like pt to go there to receive services, will need to call back when business office is open to check availability. CSW notified by PT that recommendation was SNF late last week. [name] has hired sitters from [employer] to be with pt when family can't be present. TOC will continue to follow.         Patient Goals and CMS Choice            Expected Discharge Plan and Services                                              Prior Living Arrangements/Services                       Activities of Daily Living Home Assistive Devices/Equipment: Shower chair with back, Walker (specify type) (2 wheels) ADL Screening (condition at time of admission) Patient's cognitive ability adequate to safely complete daily activities?: Yes Is the patient deaf or have difficulty hearing?: Yes Does the patient have difficulty seeing, even when wearing glasses/contacts?: No Does the patient have difficulty concentrating, remembering, or making decisions?: Yes Patient able to express need for assistance with ADLs?: Yes Does the patient have difficulty dressing or bathing?: No Independently performs ADLs?: Yes (appropriate for developmental  age) Communication: Independent Dressing (OT): Independent Grooming: Independent Feeding: Independent Bathing: Independent Toileting: Independent In/Out Bed: Independent Walks in Home: Independent Does the patient have difficulty walking or climbing stairs?: Yes Weakness of Legs: Both Weakness of Arms/Hands: Both  Permission Sought/Granted                  Emotional Assessment              Admission diagnosis:  Status epilepticus ([place]) [G40.901] Somnolence [R40.0] Seizures ([place]) [R56.9] Acute respiratory failure with hypercapnia (HCC) [J96.02] Stroke-like symptoms [R29.90] Patient Active Problem List   Diagnosis Date Noted   Seizures ([place]) 06/22/2022   Left middle cerebral artery stroke ([place]) 02/13/2022   Acute ischemic left MCA stroke ([place]) 02/08/2022   Status post stroke 02/08/2022   Middle cerebral artery embolism, left 02/08/2022   Endotracheal tube present    Chronic HFrEF (heart failure with reduced ejection fraction) (HCC)    NSVT (nonsustained ventricular tachycardia) ([place]) 02/06/2022   Hypoxia 02/05/2022   Chronic anticoagulation 02/05/2022   HFrEF (heart failure with reduced ejection fraction) ([place]) 02/05/2022   Pulmonary hypertension ([place]) 02/05/2022   Valvular cardiomyopathy ([place]) 10/22/2021   S/P mitral valve clip implantation  10/05/2021   Varicose veins of both lower extremities 07/13/2021   Injury of right leg 07/06/2021   Right leg pain 07/06/2021   Callus of foot 07/06/2021   Proximal muscle weakness 08/24/2020   Severe mitral regurgitation s/p MitraClip June 2022 07/09/2020   Fatigue 05/29/2020   Diuretic-induced hypokalemia 12/16/2019   Myalgia due to statin 12/15/2019   Acquired thrombophilia ([place]) 12/15/2019   Skin lesion 08/27/2018   Hospital discharge follow-up 05/25/2018   Abnormal chest x-ray 05/25/2018   History of CVA (cerebrovascular accident) without residual deficits 05/10/2018   Parent-child estrangement nec 02/09/2018    Basal cell carcinoma (BCC) of right lower extremity 08/08/2017   Hematuria, gross 08/08/2017   Extremity atherosclerosis with intermittent claudication ([place]) 08/08/2017   Generalized anxiety disorder 01/31/2017   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   Controlled type 2 diabetes mellitus with microalbuminuria, without long-term current use of insulin ([place]) 01/20/2016   Insomnia secondary to anxiety 10/09/2015   Polycythemia, secondary 07/04/2015   Pre-syncope 07/02/2015   Osteopenia 08/21/2014   Gastritis 05/26/2014   Epistaxis 03/18/2014   GERD (gastroesophageal reflux disease) 03/04/2014   Medicare annual wellness visit, subsequent 01/13/2014   Essential hypertension 11/04/2013   Screening for breast cancer 07/01/2013   Left shoulder pain 03/21/2013   Hoarseness of voice 03/21/2013   Edema 09/05/2010   VENTRICULAR HYPERTROPHY, LEFT 08/16/2010   Hyperlipidemia 06/03/2010   ATRIAL FIBRILLATION 08/13/2009   PCP:  Crecencio Mc, MD Pharmacy:   [place], [place] - [place] [address] Phone: [phone] Fax: [phone]  [facility] - [place], [place] - [address] [place] [address] Phone: [phone] Fax: [phone]     Social Determinants of Health (SDOH) Social History: SDOH Screenings   Food Insecurity: No Food Insecurity (06/22/2022)  Housing: Low Risk  (06/22/2022)  Transportation Needs: No Transportation Needs (06/22/2022)  Utilities: Not At Risk (06/22/2022)  Depression (PHQ2-9): Low Risk  (11/24/2021)  Financial Resource Strain: Low Risk  (11/01/2021)  Physical Activity: Inactive (08/25/2020)  Social Connections: Moderately Integrated (11/01/2021)  Stress: No Stress Concern Present (11/01/2021)  Tobacco Use: Low Risk  (06/22/2022)   SDOH Interventions:     Readmission Risk Interventions     No data to display

## 2022-06-25 NOTE — Plan of Care (Signed)
  Problem: Education: Goal: Ability to describe self-care measures that may prevent or decrease complications (Diabetes Survival Skills Education) will improve Outcome: Progressing Goal: Individualized Educational Video(s) Outcome: Progressing   

## 2022-06-25 NOTE — Progress Notes (Signed)
ANTICOAGULATION CONSULT NOTE - Follow Up Consult  Pharmacy Consult for Heparin Indication: atrial fibrillation  Allergies  Allergen Reactions   Barium Iodide     Unknown   Lipitor [Atorvastatin] Other (See Comments)    Myalgias    Statins Other (See Comments)    Myalgias    Crestor [Rosuvastatin Calcium] Other (See Comments)    Myalgias    Penicillins Rash   Sulfa Antibiotics Rash   Sulfonamide Derivatives Rash    Patient Measurements: Height: 5' 3.5" (161.3 cm) Weight: 73.9 kg (163 lb) IBW/kg (Calculated) : 53.55 Heparin Dosing Weight: 69 kg  Vital Signs: Temp: 97.6 F (36.4 C) (01/06 1925) Temp Source: Oral (01/06 1925) BP: 169/86 (01/06 2123) Pulse Rate: 87 (01/06 1614)  Labs: Recent Labs    06/22/22 0331 06/22/22 1330 06/23/22 0521 06/24/22 1022 06/24/22 2153  HGB 14.2  --  14.2 14.9  --   HCT 45.0  --  43.9 46.9*  --   PLT 182  --  170 166  --   APTT  --    < > 63* 199* 110*  HEPARINUNFRC  --    < > 0.80* >1.10* 0.92*  CREATININE 0.73  --  0.76 0.90 0.81   < > = values in this interval not displayed.     Estimated Creatinine Clearance: 48.6 mL/min (by C-G formula based on SCr of 0.81 mg/dL).   Medications:  Scheduled:   cephALEXin  500 mg Oral Q12H   insulin aspart  0-9 Units Subcutaneous Q4H   levETIRAcetam  500 mg Oral BID   Infusions:   heparin Stopped (06/24/22 2350)   magnesium sulfate bolus IVPB 2 g (06/24/22 2354)    Assessment: 89 yo F on apixaban PTA for afib, now held for seizures. Last PTA dose on 1/3. Pharmacy consulted for heparin dosing.   Heparin level elevated at 0.92 and aPTT is supratherapeutic at 110 seconds on 950 units/hr.  When I called the RN to discuss lab I was told heparin was paused to infuse magnesium. These Ivs are compatible, but he wasn't comfortable infusing together and the provider is aware heparin has been paused. No overt s/sx of bleeding reported. Will get CBC with next level and order one more aPTT to  ensure labs are correlating from prior apixaban.   Goal of Therapy:  Heparin level 0.3-0.7 units/ml aPTT 66-102 seconds Monitor platelets by anticoagulation protocol: Yes   Plan:  Reduce IV heparin gtt to 850 units/hr. Check ~8 hr heparin level, aPTT  Daily CBC, heparin level, aPTT.  Monitor for signs/symptoms of bleeding. Follow-up ability to resume oral anticoagulation.   Georga Bora, PharmD Clinical Pharmacist 06/25/2022 12:19 AM Please check AMION for all [place] numbers

## 2022-06-25 NOTE — Progress Notes (Signed)
[facility]  PROGRESS NOTE  [name] XVQ:[id number] DOB: [birth year] DOA: 06/21/2022 PCP: Crecencio Mc, MD   Brief HPI:    89 year old female with medical history of HFpEF, pulmonary hypertension, severe MR status post MitraClip in June 2022, diabetes mellitus type 2, stroke, hypertension, atrial fibrillation on Eliquis, hypertension, hyperlipidemia.  Patient was admitted in August 2023 for left MCA infarction/M3 occlusion s/p TNK and mechanical thrombectomy.  She did have small right frontal hematoma which was likely hemorrhagic transformation from the TNK.  Came to ED from nursing home with right-sided gaze, right-sided weakness and right-sided twitching and unresponsiveness.  She had 1 episode of emesis with EMS and was given Zofran.  In the ED she was found to be tachypneic with O2 sats in 80s on room air, she was placed on nonrebreather and subsequently placed on BiPAP.  Chest x-ray showed cardiomegaly with vascular congestion question pulmonary edema. Neurology was consulted, CT head was negative.  Patient received Ativan and Keppra.  EEG showed cortical dysfunction arising from left frontal temporal region likely secondary to  underlying structural abnormality, postictal state. Neurology discussed with patient's family and patient was made DNR.   Subjective   Patient seen and examined, much more alert today.  Communicating well.  P.o. intake is improved.  Son and daughter-in-law at bedside.  Family had refused PICC line placement yesterday.   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 -Patient received Ativan in the ED also  received IV Keppra -Dose of Keppra has been changed to  500 milligram IV twice daily  due to hypersomnolence   Acute hypercapnic respiratory failure -Probable acute on chronic HFpEF -Patient was unresponsive on arrival to ED with O2 sats in 80s on room air -Initially placed on nonrebreather, switched to BiPAP --BNP 545.3 -She did receive 1 dose of Lasix 40 mg IV yesterday -Breathing seems to have improved -She takes p.o. Lasix at home, will resume at discharge  Hypokalemia -Replete    Severe MR s/p MitraClip June 2022 -Echocardiogram done in August 2023 showed moderate MR   Diabetes mellitus type 2 -Hemoglobin A1c 6.6 in August 2023 -Continue sliding scale insulin with NovoLog   Chronic atrial fibrillation -Patient was taking Eliquis at home -Dr. Marlowe Sax discussed with neurology, patient started on IV heparin -Will risk IV heparin and restart Eliquis   Hypertension -Continue as needed IV hydralazine    UTI -Patient has abnormal UA -Will obtain urine culture -Urine culture grew Citrobacter and E. Coli, sensitive to cefazolin -Ceftriaxone was switched to  p.o. Keflex 500 mg twice daily   Goals of care -Discussed with patient's son and daughter-in-law at bedside, they are pursuing aggressive measures.  They want her to go back to skilled nursing facility with palliative care follow-up.  Will consult palliative care for further clarification of goals of care.   Medications     cephALEXin  500 mg Oral Q12H   insulin aspart  0-9 Units Subcutaneous Q4H   levETIRAcetam  500 mg Oral BID     Data Reviewed:   CBG:  Recent Labs  Lab 06/24/22 1700 06/24/22 2000 06/25/22 0033 06/25/22 0428 06/25/22 0753  GLUCAP 213* 108* 179* 191* 159*    SpO2: 95 % O2 Flow Rate (  L/min): 3 L/min FiO2 (%): 50 %    Vitals:   06/25/22 0031 06/25/22 0426 06/25/22 0433 06/25/22 0753  BP: (!) 163/99 (!) 159/87  (!) 153/94  Pulse: 85 (!) 102  90  Resp: '17 17  18  '$ Temp: 97.7 F  (36.5 C) 98.1 F (36.7 C)  98.3 F (36.8 C)  TempSrc: Oral Oral  Oral  SpO2: 100% (!) 87% 92% 95%  Weight:      Height:          Data Reviewed:  Basic Metabolic Panel: Recent Labs  Lab 06/21/22 1640 06/21/22 1646 06/21/22 1846 06/21/22 2222 06/22/22 0331 06/23/22 0521 06/24/22 1022 06/24/22 2153  NA 135 138   < > 147* 135 141 144 142  K 4.4 4.4   < > 2.2* 4.4 3.4* 2.9* 4.2  CL 100 103  --   --  101 101 96* 97*  CO2 22  --   --   --  20* 29 34* 33*  GLUCOSE 172* 171*  --   --  130* 113* 140* 170*  BUN 15 20  --   --  '16 14 17 19  '$ CREATININE 1.05* 0.80  --   --  0.73 0.76 0.90 0.81  CALCIUM 9.3  --   --   --  8.8* 8.9 9.0 9.3  MG  --   --   --   --  1.8  --   --  1.7   < > = values in this interval not displayed.    CBC: Recent Labs  Lab 06/21/22 1640 06/21/22 1646 06/21/22 1846 06/21/22 2222 06/22/22 0331 06/23/22 0521 06/24/22 1022  WBC 7.8  --   --   --  8.6 5.6 3.9*  NEUTROABS 5.0  --   --   --   --   --   --   HGB 14.8   < > 13.9 7.8* 14.2 14.2 14.9  HCT 45.3   < > 41.0 23.0* 45.0 43.9 46.9*  MCV 97.4  --   --   --  101.8* 97.1 98.3  PLT 217  --   --   --  182 170 166   < > = values in this interval not displayed.    LFT Recent Labs  Lab 06/21/22 1640 06/23/22 0521  AST 36 35  ALT 25 27  ALKPHOS 78 65  BILITOT 1.5* 1.9*  PROT 7.0 6.1*  ALBUMIN 4.0 3.4*     Antibiotics: Anti-infectives (From admission, onward)    Start     Dose/Rate Route Frequency Ordered Stop   06/24/22 2200  cephALEXin (KEFLEX) capsule 500 mg        500 mg Oral Every 12 hours 06/24/22 1654     06/23/22 1215  cefTRIAXone (ROCEPHIN) 2 g in sodium chloride 0.9 % 100 mL IVPB  Status:  Discontinued        2 g 200 mL/hr over 30 Minutes Intravenous Every 24 hours 06/23/22 1124 06/24/22 1654        DVT prophylaxis: Heparin  Code Status: DNR  Family Communication: Discussed with patient's daughter at bedside   CONSULTS neurology   Objective    Physical  Examination:   Appears in no acute distress S1-S2, regular Lungs bilateral crackles auscultated at lung bases Extremities no edema   Status is: Inpatient:             Oswald Hillock   [facility] If 7PM-7AM, please contact night-coverage at [email or web address], Office  [phone]   06/25/2022, 10:27 AM  LOS: 3 days

## 2022-06-25 NOTE — Progress Notes (Signed)
ANTICOAGULATION CONSULT NOTE - Follow Up Consult  Pharmacy Consult for Heparin Indication: atrial fibrillation  Allergies  Allergen Reactions   Barium Iodide     Unknown   Lipitor [Atorvastatin] Other (See Comments)    Myalgias    Statins Other (See Comments)    Myalgias    Crestor [Rosuvastatin Calcium] Other (See Comments)    Myalgias    Penicillins Rash   Sulfa Antibiotics Rash   Sulfonamide Derivatives Rash    Patient Measurements: Height: 5' 3.5" (161.3 cm) Weight: 73.9 kg (163 lb) IBW/kg (Calculated) : 53.55 Heparin Dosing Weight: 69 kg  Vital Signs: Temp: 97.6 F (36.4 C) (01/06 1925) Temp Source: Oral (01/06 1925) BP: 169/86 (01/06 2123) Pulse Rate: 87 (01/06 1614)  Labs: Recent Labs    06/22/22 0331 06/22/22 1330 06/23/22 0521 06/24/22 1022 06/24/22 2153  HGB 14.2  --  14.2 14.9  --   HCT 45.0  --  43.9 46.9*  --   PLT 182  --  170 166  --   APTT  --    < > 63* 199* 110*  HEPARINUNFRC  --    < > 0.80* >1.10* 0.92*  CREATININE 0.73  --  0.76 0.90 0.81   < > = values in this interval not displayed.     Estimated Creatinine Clearance: 48.6 mL/min (by C-G formula based on SCr of 0.81 mg/dL).   Medications:  Scheduled:   cephALEXin  500 mg Oral Q12H   insulin aspart  0-9 Units Subcutaneous Q4H   levETIRAcetam  500 mg Oral BID   Infusions:   heparin Stopped (06/24/22 2350)   magnesium sulfate bolus IVPB 2 g (06/24/22 2354)    Assessment: 89 yo F on apixaban PTA for afib, now held for seizures. Last PTA dose on 1/3. Pharmacy consulted for heparin dosing.   Heparin level today is elevated at >1.10, likely due to apixaban.  aPTT is supratherapeutic at 199 on 1050 units/hr.  IV heparin is running in right peripheral IV, and labs appear to have been drawn appropriately from opposite arm.  Hgb 14.9, plt 166 - stable. Scr 0.9 up slightly from baseline (~0.7), total bili 1.9.  No signs/symptoms of bleeding noted per RN.  Called and spoke with lab  regarding critical result, aPTT initially resulted at > 200 but was re-processed and then resulted at 199.  Advised RN to stop heparin and hold for ~1 hour, prior to resuming at reduced infusion rate. Per RN, IV heparin was stopped at ~12:40pm.   Goal of Therapy:  Heparin level 0.3-0.7 units/ml aPTT 66-102 seconds Monitor platelets by anticoagulation protocol: Yes   Plan:  Hold IV heparin for ~ 1 hour, then resume infusion at 950 units/hr. Check ~8 hr heparin level, aPTT - will follow aPTT until correlating with heparin level.  Daily CBC, heparin level, aPTT.  Monitor for signs/symptoms of bleeding. Follow-up ability to resume oral anticoagulation.   Vance Peper, PharmD PGY-2 Pharmacy Resident Phone [phone] 06/25/2022 12:12 AM   Please check AMION for all [place] phone numbers After 10:00 PM, call [place] [phone]

## 2022-06-26 DIAGNOSIS — R400 Somnolence: Secondary | ICD-10-CM | POA: Diagnosis not present

## 2022-06-26 DIAGNOSIS — R569 Unspecified convulsions: Secondary | ICD-10-CM | POA: Diagnosis not present

## 2022-06-26 DIAGNOSIS — J9602 Acute respiratory failure with hypercapnia: Secondary | ICD-10-CM | POA: Diagnosis not present

## 2022-06-26 LAB — BASIC METABOLIC PANEL
Anion gap: 10 (ref 5–15)
BUN: 24 mg/dL — ABNORMAL HIGH (ref 8–23)
CO2: 30 mmol/L (ref 22–32)
Calcium: 9.3 mg/dL (ref 8.9–10.3)
Chloride: 97 mmol/L — ABNORMAL LOW (ref 98–111)
Creatinine, Ser: 0.78 mg/dL (ref 0.44–1.00)
GFR, Estimated: >60 mL/min (ref >60)
Glucose, Bld: 174 mg/dL — ABNORMAL HIGH (ref 70–99)
Potassium: 3.7 mmol/L (ref 3.5–5.1)
Sodium: 137 mmol/L (ref 135–145)

## 2022-06-26 LAB — GLUCOSE, CAPILLARY
Glucose-Capillary: 122 mg/dL — ABNORMAL HIGH (ref 70–99)
Glucose-Capillary: 159 mg/dL — ABNORMAL HIGH (ref 70–99)
Glucose-Capillary: 168 mg/dL — ABNORMAL HIGH (ref 70–99)
Glucose-Capillary: 185 mg/dL — ABNORMAL HIGH (ref 70–99)
Glucose-Capillary: 188 mg/dL — ABNORMAL HIGH (ref 70–99)
Glucose-Capillary: 191 mg/dL — ABNORMAL HIGH (ref 70–99)
Glucose-Capillary: 236 mg/dL — ABNORMAL HIGH (ref 70–99)

## 2022-06-26 LAB — CBC
HCT: 47 % — ABNORMAL HIGH (ref 36.0–46.0)
Hemoglobin: 15.2 g/dL — ABNORMAL HIGH (ref 12.0–15.0)
MCH: 31.4 pg (ref 26.0–34.0)
MCHC: 32.3 g/dL (ref 30.0–36.0)
MCV: 97.1 fL (ref 80.0–100.0)
Platelets: 175 10*3/uL (ref 150–400)
RBC: 4.84 MIL/uL (ref 3.87–5.11)
RDW: 14.3 % (ref 11.5–15.5)
WBC: 7.3 10*3/uL (ref 4.0–10.5)
nRBC: 0 % (ref 0.0–0.2)

## 2022-06-26 MED ORDER — FUROSEMIDE 20 MG PO TABS
20.0000 mg | ORAL_TABLET | Freq: Every day | ORAL | Status: DC
  Administered 2022-06-26 – 2022-06-27 (×2): 20 mg via ORAL
  Filled 2022-06-26 (×2): qty 1

## 2022-06-26 NOTE — Evaluation (Signed)
Occupational Therapy Evaluation Patient Details Name: Linda [name] MRN: [id number] DOB: 12-10-1933 Today's Date: 06/26/2022   History of Present Illness Patient is a 89 y/o female who presents on 06/21/22 with right sided weakness, right gaze and unresponsiveness. 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 EEG- cortical dysfunction in left temporal region. PMH includes HFpEF, pulmonary HTN, severe MR s/p MitraClip in June 2022, DM, CVA in Aug 2023, HTN, A-fib on Eliquis.   Clinical Impression   Prior to this admission, patient living at ALF. Patient poor historian therefore all information has been gleaned from previous admissions or chart review. Currently, patient is presenting with lethargy, expressive difficulties, weakness, and perseverating that she could not get out of bed because "my doctor told me not to". Patient mod A of 2 for bed mobility and transfers, and requiring mod A for ADLs due to poor attention and sequencing. Patient more alert at beginning of session, but began to keep her eyes closed as the session progressed. OT recommending SNF level rehab at patient's ALF due to current level of deficits. OT will continue to follow.      Recommendations for follow up therapy are one component of a multi-disciplinary discharge planning process, led by the attending physician.  Recommendations may be updated based on patient status, additional functional criteria and insurance authorization.   Follow Up Recommendations  Skilled nursing-short term rehab (<3 hours/day)     Assistance Recommended at Discharge Frequent or constant Supervision/Assistance  Patient can return home with the following Two people to help with walking and/or transfers;A lot of help with bathing/dressing/bathroom;Assistance with cooking/housework;Assistance with feeding;Direct supervision/assist for medications management;Direct supervision/assist for  financial management;Assist for transportation;Help with stairs or ramp for entrance    Functional Status Assessment  Patient has had a recent decline in their functional status and demonstrates the ability to make significant improvements in function in a reasonable and predictable amount of time.  Equipment Recommendations  Other (comment) (Defer to next venue)    Recommendations for Other Services       Precautions / Restrictions Precautions Precautions: Fall Restrictions Weight Bearing Restrictions: No      Mobility Bed Mobility Overal bed mobility: Needs Assistance Bed Mobility: Supine to Sit     Supine to sit: Mod assist, +2 for safety/equipment, +2 for physical assistance     General bed mobility comments: decreased initiation noted throughout, patient reaching for PTA to come into sitting, however minimal activation at trunk, improved sitting balance once positioned EOB    Transfers Overall transfer level: Needs assistance Equipment used: 2 person hand held assist Transfers: Sit to/from Stand Sit to Stand: Min assist, Mod assist, +2 physical assistance, +2 safety/equipment           General transfer comment: patient perseverating that she could not get out of bed, with PTA and OT attempting x2 with no initiaiton with BLEs, RN present for final stand pivot, with initation noted, however min/mod A of 2 for safety and to motor plan      Balance Overall balance assessment: Needs assistance Sitting-balance support: Feet supported, No upper extremity supported Sitting balance-Leahy Scale: Fair Sitting balance - Comments: Close min guard for safety.   Standing balance support: During functional activity, Bilateral upper extremity supported Standing balance-Leahy Scale: Poor Standing balance comment: Relies on external support  ADL either performed or assessed with clinical judgement   ADL Overall ADL's : Needs  assistance/impaired Eating/Feeding: Minimal assistance;Sitting   Grooming: Minimal assistance;Sitting Grooming Details (indicate cue type and reason): for thoroughness Upper Body Bathing: Moderate assistance;Sitting   Lower Body Bathing: Moderate assistance;Maximal assistance;Sitting/lateral leans;Sit to/from stand   Upper Body Dressing : Moderate assistance;Sitting   Lower Body Dressing: Moderate assistance;Maximal assistance;Sitting/lateral leans;Sit to/from stand   Toilet Transfer: Moderate assistance;+2 for physical assistance;+2 for safety/equipment;Stand-pivot           Functional mobility during ADLs: Moderate assistance;+2 for physical assistance;+2 for safety/equipment;Cueing for safety;Cueing for sequencing General ADL Comments: Patient presenting with lethargy, expressive difficulties, weakness, and perseverating that she could not get out of bed because "my doctor told me not to"     Vision Baseline Vision/History: 0 No visual deficits Ability to See in Adequate Light: 0 Adequate Patient Visual Report: Other (comment) (Unable to assess due to cognition, poor command following throughout)       Perception     Praxis      Pertinent Vitals/Pain Pain Assessment Pain Assessment: No/denies pain     Hand Dominance Right   Extremity/Trunk Assessment Upper Extremity Assessment Upper Extremity Assessment: Generalized weakness;Difficult to assess due to impaired cognition (able to use both hands to wash face)   Lower Extremity Assessment Lower Extremity Assessment: Defer to PT evaluation   Cervical / Trunk Assessment Cervical / Trunk Assessment: Kyphotic   Communication Communication Communication: Receptive difficulties;Expressive difficulties;HOH   Cognition Arousal/Alertness: Lethargic Behavior During Therapy: Flat affect Overall Cognitive Status: Difficult to assess                                 General Comments: Patient with eyes open  throughout session, expressive aphasia throughout, only oriented to self, perseverating on getting a drink of water, and that her doctor told her that she couldnt get OOB     General Comments       Exercises     Shoulder Instructions      Home Living Family/patient expects to be discharged to:: Skilled nursing facility                                 Additional Comments: Patient lives at ALF, but family requesting SNF as found out by chart review, family not present for OT evaluation      Prior Functioning/Environment Prior Level of Function : Patient poor historian/Family not available             Mobility Comments: no AD in apartment. RW to dining room and in community. ADLs Comments: Drives to get her hair and nails does, and also small grocery shopping trips. She prepares her own breakfast and dinner in her apartment and goes to dining room for lunch.        OT Problem List: Decreased strength;Decreased range of motion;Decreased activity tolerance;Impaired balance (sitting and/or standing);Decreased coordination;Decreased cognition;Decreased safety awareness;Decreased knowledge of use of DME or AE;Decreased knowledge of precautions      OT Treatment/Interventions: Self-care/ADL training;Therapeutic exercise;Neuromuscular education;Energy conservation;DME and/or AE instruction;Manual therapy;Therapeutic activities;Cognitive remediation/compensation;Patient/family education;Balance training    OT Goals(Current goals can be found in the care plan section) Acute Rehab OT Goals Patient Stated Goal: unable to state OT Goal Formulation: Patient unable to participate in goal setting Time For Goal Achievement: 07/10/22 Potential to Achieve Goals: Fair  ADL Goals Pt Will Perform Lower Body Bathing: with min assist;sit to/from stand;sitting/lateral leans Pt Will Perform Lower Body Dressing: with min assist;sitting/lateral leans;sit to/from stand Pt Will Transfer to  Toilet: with mod assist;stand pivot transfer;bedside commode Pt Will Perform Toileting - Clothing Manipulation and hygiene: with min assist;sitting/lateral leans;sit to/from stand Additional ADL Goal #1: Patient will complete bed mobility at min A level as precursor to ADL activities.  OT Frequency: Min 2X/week    Co-evaluation   Reason for Co-Treatment: Necessary to address cognition/behavior during functional activity;For patient/therapist safety;To address functional/ADL transfers   OT goals addressed during session: ADL's and self-care      AM-PAC OT "6 Clicks" Daily Activity     Outcome Measure Help from another person eating meals?: A Little Help from another person taking care of personal grooming?: A Little Help from another person toileting, which includes using toliet, bedpan, or urinal?: A Lot Help from another person bathing (including washing, rinsing, drying)?: A Lot Help from another person to put on and taking off regular upper body clothing?: A Little Help from another person to put on and taking off regular lower body clothing?: A Lot 6 Click Score: 15   End of Session Equipment Utilized During Treatment: Gait belt Nurse Communication: Mobility status  Activity Tolerance: Patient limited by lethargy;Patient limited by fatigue Patient left: in chair;with call bell/phone within reach;with chair alarm set  OT Visit Diagnosis: Unsteadiness on feet (R26.81);Other abnormalities of gait and mobility (R26.89);Muscle weakness (generalized) (M62.81);Other symptoms and signs involving cognitive function                Time: 0626-9485 OT Time Calculation (min): 30 min Charges:  OT General Charges $OT Visit: 1 Visit OT Evaluation $OT Eval Moderate Complexity: 1 Mod  Corinne Ports E. Kerby Borner, OTR/L Acute Rehabilitation Services [phone]   Ascencion Dike 06/26/2022, 12:14 PM

## 2022-06-26 NOTE — Progress Notes (Signed)
Physical Therapy Treatment Patient Details Name: Linda [name] MRN: [id number] DOB: [birth year] Today's Date: 06/26/2022   History of Present Illness Patient is a 89 y/o female who presents on 06/21/22 with right sided weakness, right gaze and unresponsiveness. 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 EEG- cortical dysfunction in left temporal region. PMH includes HFpEF, pulmonary HTN, severe MR s/p MitraClip in June 2022, DM, CVA in Aug 2023, HTN, A-fib on Eliquis.    PT Comments    Pt greeted supine in bed and agreeable to session at start, requiring mod A +2 to complete bed mobitliy. Once seated EOB pt perseverating that she could not get out of bed or move because "my doctor told me not to". With encouragement and education pt able to squat pivot to recliner with mod a +2. Pt continues to be limited by impaired cognition, weakness and fatigue, with pt keeping eyes closed towards end of session. Current plan remains appropriate to address deficits and maximize functional independence and decrease caregiver burden. Pt continues to benefit from skilled PT services to progress toward functional mobility goals.     Recommendations for follow up therapy are one component of a multi-disciplinary discharge planning process, led by the attending physician.  Recommendations may be updated based on patient status, additional functional criteria and insurance authorization.  Follow Up Recommendations  Skilled nursing-short term rehab (<3 hours/day) Can patient physically be transported by private vehicle: No   Assistance Recommended at Discharge Frequent or constant Supervision/Assistance  Patient can return home with the following Two people to help with walking and/or transfers;A lot of help with bathing/dressing/bathroom;Assist for transportation;Direct supervision/assist for medications management;Direct supervision/assist for  financial management   Equipment Recommendations  Other (comment) (TBD)    Recommendations for Other Services       Precautions / Restrictions Precautions Precautions: Fall Restrictions Weight Bearing Restrictions: No     Mobility  Bed Mobility Overal bed mobility: Needs Assistance Bed Mobility: Supine to Sit     Supine to sit: Mod assist, +2 for safety/equipment, +2 for physical assistance     General bed mobility comments: decreased initiation noted throughout, patient reaching for PTA to come into sitting, however minimal activation at trunk, improved sitting balance once positioned EOB    Transfers Overall transfer level: Needs assistance Equipment used: 2 person hand held assist Transfers: Sit to/from Stand Sit to Stand: Min assist, Mod assist, +2 physical assistance, +2 safety/equipment           General transfer comment: patient perseverating that she could not get out of bed, with PTA and OT attempting x2 with no initiaiton with BLEs, RN present for final stand pivot, with initation noted, however min/mod A of 2 for safety and to motor plan    Ambulation/Gait                   Stairs             Wheelchair Mobility    Modified Rankin (Stroke Patients Only)       Balance Overall balance assessment: Needs assistance Sitting-balance support: Feet supported, No upper extremity supported Sitting balance-Leahy Scale: Fair Sitting balance - Comments: Close min guard for safety.   Standing balance support: During functional activity, Bilateral upper extremity supported Standing balance-Leahy Scale: Poor Standing balance comment: Relies on external support  Cognition Arousal/Alertness: Lethargic Behavior During Therapy: Flat affect Overall Cognitive Status: Difficult to assess                                 General Comments: Patient with eyes open throughout session, expressive  aphasia throughout, only oriented to self, perseverating on getting a drink of water, and that her doctor told her that she couldnt get OOB        Exercises      General Comments General comments (skin integrity, edema, etc.): VSS on RA      Pertinent Vitals/Pain Pain Assessment Pain Assessment: No/denies pain    Home Living Family/patient expects to be discharged to:: Skilled nursing facility                   Additional Comments: Patient lives at ALF, but family requesting SNF as found out by chart review, family not present for OT evaluation    Prior Function            PT Goals (current goals can now be found in the care plan section) Acute Rehab PT Goals PT Goal Formulation: Patient unable to participate in goal setting Time For Goal Achievement: 07/07/22 Progress towards PT goals: Progressing toward goals    Frequency    Min 3X/week      PT Plan      Co-evaluation PT/OT/SLP Co-Evaluation/Treatment: Yes Reason for Co-Treatment: Complexity of the patient's impairments (multi-system involvement);Necessary to address cognition/behavior during functional activity;To address functional/ADL transfers PT goals addressed during session: Mobility/safety with mobility;Balance OT goals addressed during session: ADL's and self-care      AM-PAC PT "6 Clicks" Mobility   Outcome Measure  Help needed turning from your back to your side while in a flat bed without using bedrails?: A Little Help needed moving from lying on your back to sitting on the side of a flat bed without using bedrails?: Total Help needed moving to and from a bed to a chair (including a wheelchair)?: A Lot Help needed standing up from a chair using your arms (e.g., wheelchair or bedside chair)?: A Lot Help needed to walk in hospital room?: Total Help needed climbing 3-5 steps with a railing? : Total 6 Click Score: 10    End of Session Equipment Utilized During Treatment: Gait  belt;Oxygen Activity Tolerance: Patient tolerated treatment well Patient left: in chair;with call bell/phone within reach;with chair alarm set Nurse Communication: Mobility status PT Visit Diagnosis: Difficulty in walking, not elsewhere classified (R26.2);Muscle weakness (generalized) (M62.81);Unsteadiness on feet (R26.81)     Time: 0071-2197 PT Time Calculation (min) (ACUTE ONLY): 31 min  Charges:  $Therapeutic Activity: 8-22 mins                     Alydia Gosser R. PTA Acute Rehabilitation Services Office: [place] 06/26/2022, 1:23 PM

## 2022-06-26 NOTE — Progress Notes (Signed)
[facility]  PROGRESS NOTE  Linda [name] [facility] QQP:[id number] DOB: 23-Jul-1933 DOA: 06/21/2022 PCP: Crecencio Mc, MD   Brief HPI:    89 year old female with medical history of HFpEF, pulmonary hypertension, severe MR status post MitraClip in June 2022, diabetes mellitus type 2, stroke, hypertension, atrial fibrillation on Eliquis, hypertension, hyperlipidemia.  Patient was admitted in August 2023 for left MCA infarction/M3 occlusion s/p TNK and mechanical thrombectomy.  She did have small right frontal hematoma which was likely hemorrhagic transformation from the TNK.  Came to ED from nursing home with right-sided gaze, right-sided weakness and right-sided twitching and unresponsiveness.  She had 1 episode of emesis with EMS and was given Zofran.  In the ED she was found to be tachypneic with O2 sats in 80s on room air, she was placed on nonrebreather and subsequently placed on BiPAP.  Chest x-ray showed cardiomegaly with vascular congestion question pulmonary edema. Neurology was consulted, CT head was negative.  Patient received Ativan and Keppra.  EEG showed cortical dysfunction arising from left frontal temporal region likely secondary to  underlying structural abnormality, postictal state. Neurology discussed with patient's family and patient was made DNR.   Subjective   Patient seen and examined, complains of mild shortness of breath.  P.o. intake has been poor.   Assessment/Plan:    Focal status epilepticus secondary to poststroke epilepsy -Patient presented with right-sided gaze, right-sided weakness and twitching with unresponsiveness -CT head was negative for acute finding -Neurology felt no further imaging needed, EEG showed cortical dysfunction arising from left frontotemporal region due to underlying structural abnormality, postictal state. -Patient received Ativan in the ED also received IV Keppra -Dose of Keppra has been changed to  500 milligram IV twice daily  due to  hypersomnolence   Acute hypercapnic respiratory failure -Probable acute on chronic HFpEF -Patient was unresponsive on arrival to ED with O2 sats in 80s on room air -Initially placed on nonrebreather, switched to BiPAP --BNP 545.3 -She did receive 1 dose of Lasix 40 mg IV with good diuretic response -Breathing seems to have improved -She takes p.o. Lasix at home, will start Lasix 20 mg p.o. daily  Hypokalemia -Replete    Severe MR s/p MitraClip June 2022 -Echocardiogram done in August 2023 showed moderate MR   Diabetes mellitus type 2 -Hemoglobin A1c 6.6 in August 2023 -Continue sliding scale insulin with NovoLog   Chronic atrial fibrillation -Patient was taking Eliquis at home -Dr. Marlowe Sax discussed with neurology, patient started on IV heparin -Will risk IV heparin and restart Eliquis   Hypertension -Continue as needed IV hydralazine    UTI -Patient has abnormal UA -Will obtain urine culture -Urine culture grew Citrobacter and E. Coli, sensitive to cefazolin -Ceftriaxone was switched to  p.o. Keflex 500 mg twice daily   Goals of care -Discussed with patient's son and daughter-in-law at bedside, they are pursuing aggressive measures.  They want her to go back to skilled nursing facility with palliative care follow-up.  Will consult palliative care for further clarification of goals of care.   Medications     apixaban  5 mg Oral BID   cephALEXin  500 mg Oral Q12H   insulin aspart  0-9 Units Subcutaneous Q4H   levETIRAcetam  500 mg Oral BID     Data Reviewed:   CBG:  Recent Labs  Lab 06/25/22 1629 06/25/22 2036 06/26/22 0103 06/26/22 0414 06/26/22 0843  GLUCAP 246* 152* 185* 191* 236*    SpO2: 94 % O2 Flow Rate (L/min):  3 L/min FiO2 (%): 50 %    Vitals:   06/26/22 0002 06/26/22 0100 06/26/22 0310 06/26/22 0802  BP: 107/82  116/86 118/77  Pulse: 91  100 89  Resp: (!) 24 16 (!) 24 (!) 30  Temp: 98 F (36.7 C)  98.3 F (36.8 C) 98.8 F (37.1 C)   TempSrc: Oral  Oral Oral  SpO2: 94%  100% 94%  Weight:      Height:          Data Reviewed:  Basic Metabolic Panel: Recent Labs  Lab 06/22/22 0331 06/23/22 0521 06/24/22 1022 06/24/22 2153 06/25/22 1149 06/26/22 0400  NA 135 141 144 142 138 137  K 4.4 3.4* 2.9* 4.2 4.3 3.7  CL 101 101 96* 97* 100 97*  CO2 20* 29 34* 33* 23 30  GLUCOSE 130* 113* 140* 170* 241* 174*  BUN '16 14 17 19 21 '$ 24*  CREATININE 0.73 0.76 0.90 0.81 0.86 0.78  CALCIUM 8.8* 8.9 9.0 9.3 9.5 9.3  MG 1.8  --   --  1.7  --   --     CBC: Recent Labs  Lab 06/21/22 1640 06/21/22 1646 06/21/22 2222 06/22/22 0331 06/23/22 0521 06/24/22 1022 06/26/22 0400  WBC 7.8  --   --  8.6 5.6 3.9* 7.3  NEUTROABS 5.0  --   --   --   --   --   --   HGB 14.8   < > 7.8* 14.2 14.2 14.9 15.2*  HCT 45.3   < > 23.0* 45.0 43.9 46.9* 47.0*  MCV 97.4  --   --  101.8* 97.1 98.3 97.1  PLT 217  --   --  182 170 166 175   < > = values in this interval not displayed.    LFT Recent Labs  Lab 06/21/22 1640 06/23/22 0521  AST 36 35  ALT 25 27  ALKPHOS 78 65  BILITOT 1.5* 1.9*  PROT 7.0 6.1*  ALBUMIN 4.0 3.4*     Antibiotics: Anti-infectives (From admission, onward)    Start     Dose/Rate Route Frequency Ordered Stop   06/24/22 2200  cephALEXin (KEFLEX) capsule 500 mg        500 mg Oral Every 12 hours 06/24/22 1654 06/30/22 2159   06/23/22 1215  cefTRIAXone (ROCEPHIN) 2 g in sodium chloride 0.9 % 100 mL IVPB  Status:  Discontinued        2 g 200 mL/hr over 30 Minutes Intravenous Every 24 hours 06/23/22 1124 06/24/22 1654        DVT prophylaxis: Heparin  Code Status: DNR  Family Communication: Discussed with patient's daughter at bedside   CONSULTS neurology   Objective    Physical Examination:   Appears in no acute distress Mildly tachypneic, decreased breath sounds bilaterally Heart S1-S2, regular Abdomen is soft, nontender, no organomegaly  Status is: Inpatient:       Oswald Hillock   [facility] If 7PM-7AM, please contact night-coverage at [email or web address], Office  [phone]   06/26/2022, 11:31 AM  LOS: 4 days

## 2022-06-26 NOTE — Progress Notes (Signed)
Speech Language Pathology Treatment: Dysphagia  Patient Details Name: Linda [name] MRN: [id number] DOB: 1933/07/31 Today's Date: 06/26/2022 Time: 0947-0962 SLP Time Calculation (min) (ACUTE ONLY): 14 min  Assessment / Plan / Recommendation Clinical Impression  Pt seen for dysphagia f/u tx session with continued waxing/waning with mentation/alertness level, so trial was brief.  Pt required mod verbal cues to maintain alertness level during consumption, but slow thorough mastication efforts noted during solid consistency with mildly prolonged oral manipulation.  Pt consumed guided sips of thin liquids via straw without overt s/sx of aspiration noted throughout trial of both solids/thin liquids.  Nursing staff stated no concerns with medications given whole/liquids earlier in shift.  Pt would benefit from FULL PRECAUTIONS during meals/snacks with A during meals to ensure safety.  Continue Dysphagia 3/thin liquid diet with swallowing precautions in place and A with meals/FULL precautions.  ST will s/o at this time.    HPI HPI: Linda [name] is a 89 y.o. female with medical history significant of HFpEF, pulmonary hypertension, severe MR status post MitraClip June 2022, type 2 diabetes, stroke, hypertension, A-fib on Eliquis, hypertension, hyperlipidemia, epistaxis. Patient was admitted in August 2023 for left MCA infarction/M3 occlusion status post TNK and mechanical thrombectomy. She did have a small right frontal hematoma which was likely hemorrhagic transformation from the TNK. She presented to the ED today from her nursing home with right-sided gaze, right-sided weakness, right-sided twitching, and unresponsiveness. She had 1 episode of emesis with EMS and was given Zofran. In the ED, patient noted to be tachypneic and oxygen saturation in the 80s on room air. Initially placed on nonrebreather. ABG showing pH 7.27, pCO2 62, pO2 247. Subsequently placed on BiPAP. Labs showing no leukocytosis, glucose 172,  creatinine 1.0 (baseline 0.6-0.8), T. bili 1.5 (slightly elevated on previous labs as well) and remainder of LFTs normal, UA and UDS pending, blood ethanol level undetectable. Chest x-ray showing cardiomegaly with vascular congestion and question of septal thickening/pulmonary edema. CT head negative for acute finding. 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      SLP Plan  Continue with current plan of care      Recommendations for follow up therapy are one component of a multi-disciplinary discharge planning process, led by the attending physician.  Recommendations may be updated based on patient status, additional functional criteria and insurance authorization.    Recommendations  Diet recommendations: Dysphagia 3 (mechanical soft);Thin liquid Liquids provided via: Cup;Straw Medication Administration: Whole meds with puree Supervision: Staff to assist with self feeding;Full supervision/cueing for compensatory strategies Compensations: Slow rate;Small sips/bites;Minimize environmental distractions Postural Changes and/or Swallow Maneuvers: Seated upright 90 degrees                General recommendations: Other(comment) (TBD) Oral Care Recommendations: Oral care BID;Staff/trained caregiver to provide oral care Follow Up Recommendations: Follow physician's recommendations for discharge plan and follow up therapies Assistance recommended at discharge: Frequent or constant Supervision/Assistance SLP Visit Diagnosis: Dysphagia, oropharyngeal phase (R13.12) Plan: Continue with current plan of care           Pat Lejuan Botto,M.S., CCC-SLP  06/26/2022, 2:36 PM

## 2022-06-26 NOTE — Care Management Important Message (Signed)
Important Message  Patient Details  Name: Linda [name] MRN: [id number] Date of Birth: April 23, 1934   Medicare Important Message Given:  Yes     [name] 06/26/2022, 3:15 PM

## 2022-06-26 NOTE — TOC Progression Note (Signed)
Transition of Care [facility]) - Progression Note    Patient Details  Name: Linda [name] MRN: [id number] Date of Birth: 1933-08-03  Transition of Care [facility]) CM/SW Contact  Jinger Neighbors, [place] Phone Number: 06/26/2022, 4:06 PM  Clinical Narrative:      CSW attempted to call Lorelee Cover, [facility] Social Worker; no answer. TOC will continue to follow       Expected Discharge Plan and Services                                               Social Determinants of Health (SDOH) Interventions SDOH Screenings   Food Insecurity: No Food Insecurity (06/22/2022)  Housing: Low Risk  (06/22/2022)  Transportation Needs: No Transportation Needs (06/22/2022)  Utilities: Not At Risk (06/22/2022)  Depression (PHQ2-9): Low Risk  (11/24/2021)  Financial Resource Strain: Low Risk  (11/01/2021)  Physical Activity: Inactive (08/25/2020)  Social Connections: Moderately Integrated (11/01/2021)  Stress: No Stress Concern Present (11/01/2021)  Tobacco Use: Low Risk  (06/22/2022)    Readmission Risk Interventions     No data to display

## 2022-06-27 DIAGNOSIS — R569 Unspecified convulsions: Secondary | ICD-10-CM | POA: Diagnosis not present

## 2022-06-27 LAB — BASIC METABOLIC PANEL
Anion gap: 11 (ref 5–15)
BUN: 22 mg/dL (ref 8–23)
CO2: 28 mmol/L (ref 22–32)
Calcium: 9.2 mg/dL (ref 8.9–10.3)
Chloride: 95 mmol/L — ABNORMAL LOW (ref 98–111)
Creatinine, Ser: 0.68 mg/dL (ref 0.44–1.00)
GFR, Estimated: >60 mL/min (ref >60)
Glucose, Bld: 181 mg/dL — ABNORMAL HIGH (ref 70–99)
Potassium: 3.7 mmol/L (ref 3.5–5.1)
Sodium: 134 mmol/L — ABNORMAL LOW (ref 135–145)

## 2022-06-27 LAB — CBC
HCT: 47.2 % — ABNORMAL HIGH (ref 36.0–46.0)
Hemoglobin: 15.7 g/dL — ABNORMAL HIGH (ref 12.0–15.0)
MCH: 31.9 pg (ref 26.0–34.0)
MCHC: 33.3 g/dL (ref 30.0–36.0)
MCV: 95.9 fL (ref 80.0–100.0)
Platelets: 159 10*3/uL (ref 150–400)
RBC: 4.92 MIL/uL (ref 3.87–5.11)
RDW: 14.2 % (ref 11.5–15.5)
WBC: 7.8 10*3/uL (ref 4.0–10.5)
nRBC: 0 % (ref 0.0–0.2)

## 2022-06-27 LAB — GLUCOSE, CAPILLARY: Glucose-Capillary: 152 mg/dL — ABNORMAL HIGH (ref 70–99)

## 2022-06-27 MED ORDER — INSULIN ASPART 100 UNIT/ML IJ SOLN: 0.0000 [IU] | Freq: Three times a day (TID) | INTRAMUSCULAR | 11 refills | Status: DC

## 2022-06-27 MED ORDER — CEPHALEXIN 500 MG PO CAPS: 500.0000 mg | ORAL_CAPSULE | Freq: Two times a day (BID) | ORAL | 0 refills | Status: AC

## 2022-06-27 MED ORDER — LOSARTAN POTASSIUM 25 MG PO TABS: 25.0000 mg | ORAL_TABLET | Freq: Every day | ORAL | 11 refills | Status: AC

## 2022-06-27 MED ORDER — LEVETIRACETAM 500 MG PO TABS: 500.0000 mg | ORAL_TABLET | Freq: Two times a day (BID) | ORAL | Status: AC

## 2022-06-27 NOTE — TOC Transition Note (Signed)
Transition of Care [facility]) - CM/SW Discharge Note   Patient Details  Name: Linda [name] MRN: [id number] Date of Birth: 18-Mar-1934  Transition of Care [facility]) CM/SW Contact:  Jinger Neighbors, LCSW Phone Number: 06/27/2022, 10:42 AM   Clinical Narrative:     PT going to [facility] for SNF via [facility].  Call to Report: [phone] Room: 333  Final next level of care: Skilled Nursing Facility Barriers to Discharge: No Barriers Identified   Patient Goals and CMS Choice CMS Medicare.gov Compare Post Acute Care list provided to:: Patient Represenative (must comment) [name], pt's dtr)    Discharge Placement                Patient chooses bed at: [facility] Patient to be transferred to facility by: [place] Name of family member notified: [name] (pts dtr) Patient and family notified of of transfer: 06/27/22  Discharge Plan and Services Additional resources added to the After Visit Summary for                                       Social Determinants of Health (SDOH) Interventions SDOH Screenings   Food Insecurity: No Food Insecurity (06/22/2022)  Housing: Low Risk  (06/22/2022)  Transportation Needs: No Transportation Needs (06/22/2022)  Utilities: Not At Risk (06/22/2022)  Depression (PHQ2-9): Low Risk  (11/24/2021)  Financial Resource Strain: Low Risk  (11/01/2021)  Physical Activity: Inactive (08/25/2020)  Social Connections: Moderately Integrated (11/01/2021)  Stress: No Stress Concern Present (11/01/2021)  Tobacco Use: Low Risk  (06/22/2022)     Readmission Risk Interventions     No data to display

## 2022-06-27 NOTE — Progress Notes (Signed)
Report called to [facility]. [facility] called and transportation set up. Per [facility], she is 2nd in line.

## 2022-06-27 NOTE — Discharge Summary (Signed)
Physician Discharge Summary   Patient: Linda [name] MRN: [id number] DOB: 1933-09-06  Admit date:     06/21/2022  Discharge date: 06/27/22  Discharge Physician: Oswald Hillock   PCP: Crecencio Mc, MD   Recommendations at discharge:  Morning how are you here Patient discharged to skilled nursing facility Continue Keppra 500 mg p.o. twice daily indefinitely  Discharge Diagnoses: Principal Problem:   Seizures ([place])  Resolved Problems:   * No resolved hospital problems. [facility] Course: 89 year old female with medical history of HFpEF, pulmonary hypertension, severe MR status post MitraClip in June 2022, diabetes mellitus type 2, stroke, hypertension, atrial fibrillation on Eliquis, hypertension, hyperlipidemia.  Patient was admitted in August 2023 for left MCA infarction/M3 occlusion s/p TNK and mechanical thrombectomy.  She did have small right frontal hematoma which was likely hemorrhagic transformation from the TNK.  Came to ED from nursing home with right-sided gaze, right-sided weakness and right-sided twitching and unresponsiveness.  She had 1 episode of emesis with EMS and was given Zofran.  In the ED she was found to be tachypneic with O2 sats in 80s on room air, she was placed on nonrebreather and subsequently placed on BiPAP.  Chest x-ray showed cardiomegaly with vascular congestion question pulmonary edema. Neurology was consulted, CT head was negative.  Patient received Ativan and Keppra.  EEG showed cortical dysfunction arising from left frontal temporal region likely secondary to  underlying structural abnormality, postictal state. 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 -Patient received Ativan in the ED also received IV Keppra -Dose of Keppra has been changed to  500 milligram IV twice daily  due to hypersomnolence -Will discharge on Keppra 5 mg p.o. twice daily   Acute hypercapnic respiratory failure -Probable acute on chronic HFpEF -Patient was unresponsive on arrival to ED with O2 sats in 80s on room air -Initially placed on nonrebreather, switched to BiPAP --BNP 545.3 -She did receive 1 dose of Lasix 40 mg IV with good diuretic response -Breathing seems to have improved -She takes p.o. Lasix at home, will start Lasix 20 mg p.o. daily -Continue oxygen via nasal cannula   Hypokalemia -Replete     Severe MR s/p MitraClip June 2022 -Echocardiogram done in August 2023 showed moderate MR   Diabetes mellitus type 2 -Hemoglobin A1c 6.6 in August 2023 -Continue metformin and sliding scale insulin with NovoLog   Chronic atrial fibrillation -Patient was taking Eliquis at home -Dr. Marlowe Sax discussed with neurology, patient started on IV heparin -Will risk IV heparin and restart Eliquis   Hypertension -Will start losartan 25 mg p.o. daily -Will discontinue metoprolol     UTI -Patient has abnormal UA -Will obtain urine culture -Urine culture grew Citrobacter and E. Coli, sensitive to cefazolin -Ceftriaxone was switched to  p.o. Keflex 500 mg twice daily -Continue Keflex 500 mg p.o. twice daily for 3 more days to complete 7 days of treatment   Goals of care -Discussed with patient's son and daughter-in-law at bedside, they are pursuing aggressive measures.  They want her to go back to skilled nursing facility          Consultants:  Procedures performed:  Disposition: Skilled  nursing facility Diet recommendation: Dysphagia 3 diet Discharge Diet Orders (From admission, onward)     Start     Ordered   06/27/22 0000   Diet - low sodium heart healthy        06/27/22 0947            DISCHARGE MEDICATION: Allergies as of 06/27/2022       Reactions   Barium Iodide    Unknown   Lipitor [atorvastatin] Other (See Comments)   Myalgias   Statins Other (See Comments)   Myalgias   Crestor [rosuvastatin Calcium] Other (See Comments)   Myalgias   Penicillins Rash   Sulfa Antibiotics Rash   Sulfonamide Derivatives Rash        Medication List     STOP taking these medications    metoprolol succinate 25 MG 24 hr tablet Commonly known as: TOPROL-XL   potassium chloride SA 20 MEQ tablet Commonly known as: KLOR-CON M       TAKE these medications    acetaminophen 325 MG tablet Commonly known as: TYLENOL Take 2 tablets (650 mg total) by mouth every 4 (four) hours as needed for mild pain (or temp > 37.5 C (99.5 F)).   alum & mag hydroxide-simeth 017-510-25 MG/5ML suspension Commonly known as: MAALOX PLUS Take 30 mLs by mouth every 4 (four) hours as needed for indigestion.   apixaban 5 MG Tabs tablet Commonly known as: ELIQUIS Take 1 tablet (5 mg total) by mouth 2 (two) times daily.   B-complex with vitamin C tablet Take 1 tablet by mouth daily.   cephALEXin 500 MG capsule Commonly known as: KEFLEX Take 1 capsule (500 mg total) by mouth every 12 (twelve) hours for 3 days.   docusate sodium 100 MG capsule Commonly known as: COLACE Take 1 capsule (100 mg total) by mouth 2 (two) times daily.   ezetimibe 10 MG tablet Commonly known as: ZETIA Take 1 tablet (10 mg total) by mouth daily.   FLUoxetine 20 MG capsule Commonly known as: PROZAC Take 1 capsule (20 mg total) by mouth at bedtime. What changed: when to take this   furosemide 20 MG tablet Commonly known as: LASIX Take 1 tablet (20 mg total) by mouth daily.   insulin aspart 100 UNIT/ML injection Commonly known as: novoLOG Inject 0-9 Units into the skin 3 (three) times daily with meals. Sliding scale insulin Less than 70  initiate hypoglycemia protocol 70-120  0 units 120-150 1 unit 151-200 2 units 201-250 3 units 251-300 5 units 301-350 7 units 351-400 9 units  Greater than 400 call MD   levETIRAcetam 500 MG tablet Commonly known as: KEPPRA Take 1 tablet (500 mg total) by mouth 2 (two) times daily.   loratadine 10 MG tablet Commonly known as: CLARITIN Take 1 tablet (10 mg total) by mouth daily.   losartan 25 MG tablet Commonly known as: Cozaar Take 1 tablet (25 mg total) by mouth daily. What changed:  medication strength how much to take   magnesium gluconate 500 MG tablet Commonly known as: MAGONATE Take 0.5 tablets (250 mg total) by mouth at bedtime.   melatonin 3 MG Tabs tablet Take 1 tablet (3 mg total) by mouth at bedtime.   metFORMIN 500 MG tablet Commonly known as: GLUCOPHAGE Take 1 tablet (500 mg total) by mouth 2 (two) times daily with a meal.   pantoprazole 40 MG tablet Commonly known as: PROTONIX Take 1 tablet (40 mg total) by mouth daily at 12 noon. What changed:  when to take this   vitamin D3 25 MCG tablet Commonly known as: CHOLECALCIFEROL Take 1 tablet (1,000 Units total) by mouth daily.        Follow-up Information     [facility]. Schedule an appointment as soon as possible for a visit in 2 day(s).   Specialty: Neurology Why: Please be seen for seizures in 2-4 weeks Contact information: [address] [place] [phone]               Discharge Exam: Danley Danker Weights   06/22/22 0100  Weight: 73.9 kg   General-appears in no acute distress Heart-S1-S2, regular, no murmur auscultated Lungs-clear to auscultation bilaterally, no wheezing or crackles auscultated Abdomen-soft, nontender, no organomegaly Extremities-no edema in the lower extremities   Condition at discharge: fair  The results of significant diagnostics from this hospitalization (including imaging, microbiology, ancillary and  laboratory) are listed below for reference.   Imaging Studies: [place] EKG SITE RITE  Result Date: 06/24/2022 If Site Rite image not attached, placement could not be confirmed due to current cardiac rhythm.  CT HEAD WO CONTRAST (5MM)  Result Date: 06/22/2022 CLINICAL DATA:  Seizure disorder EXAM: CT HEAD WITHOUT CONTRAST TECHNIQUE: Contiguous axial images were obtained from the base of the skull through the vertex without intravenous contrast. RADIATION DOSE REDUCTION: This exam was performed according to the departmental dose-optimization program which includes automated exposure control, adjustment of the mA and/or kV according to patient size and/or use of iterative reconstruction technique. COMPARISON:  CT Head 06/21/22 FINDINGS: Brain: Redemonstrated large area of encephalomalacia in the left MCA territory, unchanged from prior exam. There also small chronic bilateral cerebellar infarcts. No hemorrhage. No hydrocephalus. No extra-axial fluid collection. No CT evidence of a new infarct Vascular: No hyperdense vessel or unexpected calcification. Skull: Normal. Negative for fracture or focal lesion. Sinuses/Orbits: Bilateral lens replacement. Mild mucosal thickening bilateral maxillary sinuses. Orbits are otherwise unremarkable. No mastoid or middle ear effusion. Other: None. IMPRESSION: No hemorrhage or CT evidence of new infarct. Electronically Signed   By: Marin Roberts M.D.   On: 06/22/2022 12:21   Overnight EEG with video  Result Date: 06/22/2022 Lora Havens, MD     06/22/2022 12:24 PM Patient Name: Linda [name] MRN: [id number] Epilepsy Attending: Lora Havens Referring Physician/Provider: Lorenza Chick, MD Duration: 21/08/2022 2038 to 06/22/2022 0175  Patient history: 89 y.o. female with a PMHx of DM2, HTN, HLD, Afib on eliquis presenting with a right-sided gaze and right-sided weakness as well as right-sided twitching. EEG to evaluate for seizure.  Level of alertness: Awake, asleep  AEDs during  EEG study: LEV  Technical aspects: This EEG study was done with scalp electrodes positioned according to the 10-20 International system of electrode placement. Electrical activity was reviewed with band pass filter of 1-'70Hz'$ , sensitivity of 7 uV/mm, display speed of 3m/sec with a '60Hz'$  notched filter applied as appropriate. EEG data were recorded continuously and digitally stored.  Video monitoring was available and reviewed as appropriate.  Description: The posterior dominant rhythm consists of 8 Hz activity of moderate voltage (25-35 uV) seen predominantly in posterior head regions, asymmetric( left<right) and reactive to eye opening and eye closing. Sleep was characterized by vertex waves, sleep spindles (12 to 14 Hz), maximal frontocentral region. EEG showed continuous 3 to 6 Hz theta-delta slowing in left frontotemporal region. Hyperventilation and photic stimulation were not performed.    ABNORMALITY - Continuous slow, left frontotemporal region  IMPRESSION:  This study is suggestive of cortical dysfunction arising from left frontotemporal region likely secondary to underlying structural abnormality, post-ictal state.  No seizures or epileptiform discharges were seen throughout the recording.  Lora Havens   DG CHEST PORT 1 VIEW  Result Date: 06/21/2022 CLINICAL DATA:  Altered mental status. EXAM: PORTABLE CHEST 1 VIEW COMPARISON:  Radiograph 02/13/2022 FINDINGS: Chronic elevation of right hemidiaphragm. Unchanged cardiomegaly. Stable clips projecting over the expected mitral valve location. Stable mediastinal contours with aortic atherosclerosis. Vascular congestion with question of septal thickening/pulmonary edema. No large pleural effusion or confluent airspace disease. No pneumothorax. Stable osseous structures. IMPRESSION: Cardiomegaly with vascular congestion and question of septal thickening/pulmonary edema. Electronically Signed   By: Keith Rake M.D.   On: 06/21/2022 20:45   CT HEAD  CODE STROKE WO CONTRAST  Result Date: 06/21/2022 CLINICAL DATA:  Code stroke. EXAM: CT HEAD WITHOUT CONTRAST TECHNIQUE: Contiguous axial images were obtained from the base of the skull through the vertex without intravenous contrast. RADIATION DOSE REDUCTION: This exam was performed according to the departmental dose-optimization program which includes automated exposure control, adjustment of the mA and/or kV according to patient size and/or use of iterative reconstruction technique. COMPARISON:  02/15/2022 FINDINGS: Brain: No evidence of acute infarction, hemorrhage, cerebral edema, mass, mass effect, or midline shift. No hydrocephalus or extra-axial collection. Encephalomalacia in the posterior left MCA territory, consistent with expected evolution of the previously noted subacute infarct in this area. Remote left cerebellar infarct. Vascular: No hyperdense vessel. Skull: Negative for fracture or focal lesion. Sinuses/Orbits: Small mucous retention cysts in the right maxillary sinus. Status post bilateral lens replacements. Other: The mastoid air cells are well aerated. ASPECTS [facility] Stroke Program Early CT Score) - Ganglionic level infarction (caudate, lentiform nuclei, internal capsule, insula, M1-M3 cortex): 7 - Supraganglionic infarction (M4-M6 cortex): 3 Total score (0-10 with 10 being normal): 10 IMPRESSION: No acute intracranial process. ASPECTS is 10. Code stroke imaging results were communicated on 06/21/2022 at 5:14 pm to provider BHAGAT via secure text paging. Electronically Signed   By: Merilyn Baba M.D.   On: 06/21/2022 17:15    Microbiology: Results for orders placed or performed during the hospital encounter of 06/21/22  Urine Culture     Status: Abnormal   Collection Time: 06/22/22  1:53 PM   Specimen: Urine, Catheterized  Result Value Ref Range Status   Specimen Description URINE, CATHETERIZED  Final   Special Requests   Final    NONE Performed at [facility], [address] [place], [place] [address]    Culture (A)  Final    >=100,000 COLONIES/mL CITROBACTER KOSERI >=100,000 COLONIES/mL ESCHERICHIA COLI    Report Status 06/24/2022 FINAL  Final   Organism ID, Bacteria CITROBACTER KOSERI (A)  Final   Organism ID, Bacteria ESCHERICHIA COLI (A)  Final      Susceptibility   Citrobacter koseri - MIC*    CEFAZOLIN <=4 SENSITIVE Sensitive     CEFEPIME <=0.12 SENSITIVE Sensitive     CEFTRIAXONE <=0.25 SENSITIVE Sensitive     CIPROFLOXACIN <=0.25 SENSITIVE Sensitive     GENTAMICIN <=1 SENSITIVE Sensitive     IMIPENEM <=0.25 SENSITIVE Sensitive     NITROFURANTOIN <=16 SENSITIVE Sensitive     TRIMETH/SULFA <=20 SENSITIVE Sensitive     PIP/TAZO <=4 SENSITIVE Sensitive     * >=100,000 COLONIES/mL CITROBACTER KOSERI   Escherichia coli - MIC*    AMPICILLIN >=32 RESISTANT Resistant     CEFAZOLIN <=4 SENSITIVE Sensitive  CEFEPIME <=0.12 SENSITIVE Sensitive     CEFTRIAXONE <=0.25 SENSITIVE Sensitive     CIPROFLOXACIN >=4 RESISTANT Resistant     GENTAMICIN <=1 SENSITIVE Sensitive     IMIPENEM <=0.25 SENSITIVE Sensitive     NITROFURANTOIN <=16 SENSITIVE Sensitive     TRIMETH/SULFA <=20 SENSITIVE Sensitive     AMPICILLIN/SULBACTAM 4 SENSITIVE Sensitive     PIP/TAZO <=4 SENSITIVE Sensitive     * >=100,000 COLONIES/mL ESCHERICHIA COLI  MRSA Next Gen by PCR, Nasal     Status: None   Collection Time: 06/22/22  7:48 PM   Specimen: Nasal Mucosa; Nasal Swab  Result Value Ref Range Status   MRSA by PCR Next Gen NOT DETECTED NOT DETECTED Final    Comment: (NOTE) The GeneXpert MRSA Assay (FDA approved for NASAL specimens only), is one component of a comprehensive MRSA colonization surveillance program. It is not intended to diagnose MRSA infection nor to guide or monitor treatment for MRSA infections. Test performance is not FDA approved in patients less than 9 years old. Performed at [facility], [place] [address], [place], [place] [id number]      Labs: CBC: Recent Labs  Lab 06/21/22 1640 06/21/22 1646 06/22/22 0331 06/23/22 0521 06/24/22 1022 06/26/22 0400 06/27/22 0415  WBC 7.8  --  8.6 5.6 3.9* 7.3 7.8  NEUTROABS 5.0  --   --   --   --   --   --   HGB 14.8   < > 14.2 14.2 14.9 15.2* 15.7*  HCT 45.3   < > 45.0 43.9 46.9* 47.0* 47.2*  MCV 97.4  --  101.8* 97.1 98.3 97.1 95.9  PLT 217  --  182 170 166 175 159   < > = values in this interval not displayed.   Basic Metabolic Panel: Recent Labs  Lab 06/22/22 0331 06/23/22 0521 06/24/22 1022 06/24/22 2153 06/25/22 1149 06/26/22 0400 06/27/22 0415  NA 135   < > 144 142 138 137 134*  K 4.4   < > 2.9* 4.2 4.3 3.7 3.7  CL 101   < > 96* 97* 100 97* 95*  CO2 20*   < > 34* 33* '23 30 28  '$ GLUCOSE 130*   < > 140* 170* 241* 174* 181*  BUN 16   < > '17 19 21 '$ 24* 22  CREATININE 0.73   < > 0.90 0.81 0.86 0.78 0.68  CALCIUM 8.8*   < > 9.0 9.3 9.5 9.3 9.2  MG 1.8  --   --  1.7  --   --   --    < > = values in this interval not displayed.   Liver Function Tests: Recent Labs  Lab 06/21/22 1640 06/23/22 0521  AST 36 35  ALT 25 27  ALKPHOS 78 65  BILITOT 1.5* 1.9*  PROT 7.0 6.1*  ALBUMIN 4.0 3.4*   CBG: Recent Labs  Lab 06/26/22 1254 06/26/22 1625 06/26/22 1949 06/26/22 2357 06/27/22 0823  GLUCAP 122* 188* 159* 168* 152*    Discharge time spent: greater than 30 minutes.  Signed: Oswald Hillock, MD [facility] 06/27/2022

## 2022-07-20 ENCOUNTER — Inpatient Hospital Stay
Admit: 2022-07-20 | Discharge: 2022-07-20 | Disposition: A | Discharge status: Home / Self Care | Payer: Medicare Other | Attending: Internal Medicine | Admitting: Internal Medicine

## 2022-07-20 ENCOUNTER — Emergency Department: Payer: Medicare Other

## 2022-07-20 ENCOUNTER — Inpatient Hospital Stay
Admission: EM | Admit: 2022-07-20 | Discharge: 2022-07-25 | DRG: 291 | Disposition: A | Discharge status: Skilled Nursing Facility | Payer: Medicare Other | Source: Skilled Nursing Facility | Attending: Internal Medicine | Admitting: Internal Medicine

## 2022-07-20 ENCOUNTER — Inpatient Hospital Stay (HOSPITAL_COMMUNITY)
Admit: 2022-07-20 | Discharge: 2022-07-20 | Disposition: A | Discharge status: Home / Self Care | Payer: Medicare Other | Attending: Internal Medicine | Admitting: Internal Medicine

## 2022-07-20 ENCOUNTER — Other Ambulatory Visit: Payer: Self-pay

## 2022-07-20 DIAGNOSIS — J9601 Acute respiratory failure with hypoxia: Secondary | ICD-10-CM | POA: Diagnosis present

## 2022-07-20 DIAGNOSIS — I482 Chronic atrial fibrillation, unspecified: Secondary | ICD-10-CM | POA: Diagnosis present

## 2022-07-20 DIAGNOSIS — Z79899 Other long term (current) drug therapy: Secondary | ICD-10-CM | POA: Diagnosis not present

## 2022-07-20 DIAGNOSIS — R54 Age-related physical debility: Secondary | ICD-10-CM | POA: Diagnosis present

## 2022-07-20 DIAGNOSIS — I5031 Acute diastolic (congestive) heart failure: Secondary | ICD-10-CM | POA: Diagnosis present

## 2022-07-20 DIAGNOSIS — G40901 Epilepsy, unspecified, not intractable, with status epilepticus: Secondary | ICD-10-CM | POA: Diagnosis present

## 2022-07-20 DIAGNOSIS — I110 Hypertensive heart disease with heart failure: Principal | ICD-10-CM | POA: Diagnosis present

## 2022-07-20 DIAGNOSIS — Z8616 Personal history of COVID-19: Secondary | ICD-10-CM

## 2022-07-20 DIAGNOSIS — R600 Localized edema: Secondary | ICD-10-CM

## 2022-07-20 DIAGNOSIS — Z8249 Family history of ischemic heart disease and other diseases of the circulatory system: Secondary | ICD-10-CM

## 2022-07-20 DIAGNOSIS — K219 Gastro-esophageal reflux disease without esophagitis: Secondary | ICD-10-CM | POA: Diagnosis present

## 2022-07-20 DIAGNOSIS — Z803 Family history of malignant neoplasm of breast: Secondary | ICD-10-CM | POA: Diagnosis not present

## 2022-07-20 DIAGNOSIS — Z882 Allergy status to sulfonamides status: Secondary | ICD-10-CM

## 2022-07-20 DIAGNOSIS — Z85828 Personal history of other malignant neoplasm of skin: Secondary | ICD-10-CM

## 2022-07-20 DIAGNOSIS — I509 Heart failure, unspecified: Secondary | ICD-10-CM

## 2022-07-20 DIAGNOSIS — Z880 Allergy status to penicillin: Secondary | ICD-10-CM

## 2022-07-20 DIAGNOSIS — L03115 Cellulitis of right lower limb: Secondary | ICD-10-CM | POA: Diagnosis present

## 2022-07-20 DIAGNOSIS — I480 Paroxysmal atrial fibrillation: Secondary | ICD-10-CM | POA: Diagnosis present

## 2022-07-20 DIAGNOSIS — Z823 Family history of stroke: Secondary | ICD-10-CM

## 2022-07-20 DIAGNOSIS — N390 Urinary tract infection, site not specified: Secondary | ICD-10-CM | POA: Diagnosis present

## 2022-07-20 DIAGNOSIS — Z90710 Acquired absence of both cervix and uterus: Secondary | ICD-10-CM | POA: Diagnosis not present

## 2022-07-20 DIAGNOSIS — Z7901 Long term (current) use of anticoagulants: Secondary | ICD-10-CM

## 2022-07-20 DIAGNOSIS — E785 Hyperlipidemia, unspecified: Secondary | ICD-10-CM | POA: Diagnosis present

## 2022-07-20 DIAGNOSIS — Z888 Allergy status to other drugs, medicaments and biological substances status: Secondary | ICD-10-CM

## 2022-07-20 DIAGNOSIS — I5033 Acute on chronic diastolic (congestive) heart failure: Secondary | ICD-10-CM | POA: Diagnosis not present

## 2022-07-20 DIAGNOSIS — B965 Pseudomonas (aeruginosa) (mallei) (pseudomallei) as the cause of diseases classified elsewhere: Secondary | ICD-10-CM | POA: Diagnosis present

## 2022-07-20 DIAGNOSIS — I69398 Other sequelae of cerebral infarction: Secondary | ICD-10-CM

## 2022-07-20 DIAGNOSIS — E119 Type 2 diabetes mellitus without complications: Secondary | ICD-10-CM | POA: Diagnosis present

## 2022-07-20 DIAGNOSIS — Z7984 Long term (current) use of oral hypoglycemic drugs: Secondary | ICD-10-CM

## 2022-07-20 DIAGNOSIS — Z808 Family history of malignant neoplasm of other organs or systems: Secondary | ICD-10-CM

## 2022-07-20 DIAGNOSIS — R0902 Hypoxemia: Secondary | ICD-10-CM

## 2022-07-20 LAB — BASIC METABOLIC PANEL
Anion gap: 11 (ref 5–15)
BUN: 14 mg/dL (ref 8–23)
CO2: 26 mmol/L (ref 22–32)
Calcium: 9.2 mg/dL (ref 8.9–10.3)
Chloride: 101 mmol/L (ref 98–111)
Creatinine, Ser: 0.62 mg/dL (ref 0.44–1.00)
GFR, Estimated: >60 mL/min (ref >60)
Glucose, Bld: 136 mg/dL — ABNORMAL HIGH (ref 70–99)
Potassium: 3.7 mmol/L (ref 3.5–5.1)
Sodium: 138 mmol/L (ref 135–145)

## 2022-07-20 LAB — URINALYSIS, W/ REFLEX TO CULTURE (INFECTION SUSPECTED)
Bilirubin Urine: NEGATIVE
Glucose, UA: NEGATIVE mg/dL
Hgb urine dipstick: NEGATIVE
Ketones, ur: NEGATIVE mg/dL
Nitrite: NEGATIVE
Protein, ur: 100 mg/dL — AB
Specific Gravity, Urine: 1.014 (ref 1.005–1.030)
pH: 5 (ref 5.0–8.0)

## 2022-07-20 LAB — CBC WITH DIFFERENTIAL/PLATELET
Abs Immature Granulocytes: 0.01 10*3/uL (ref 0.00–0.07)
Basophils Absolute: 0 10*3/uL (ref 0.0–0.1)
Basophils Relative: 1 %
Eosinophils Absolute: 0.1 10*3/uL (ref 0.0–0.5)
Eosinophils Relative: 3 %
HCT: 42.6 % (ref 36.0–46.0)
Hemoglobin: 13.6 g/dL (ref 12.0–15.0)
Immature Granulocytes: 0 %
Lymphocytes Relative: 15 %
Lymphs Abs: 0.7 10*3/uL (ref 0.7–4.0)
MCH: 31.3 pg (ref 26.0–34.0)
MCHC: 31.9 g/dL (ref 30.0–36.0)
MCV: 98.2 fL (ref 80.0–100.0)
Monocytes Absolute: 0.5 10*3/uL (ref 0.1–1.0)
Monocytes Relative: 10 %
Neutro Abs: 3.4 10*3/uL (ref 1.7–7.7)
Neutrophils Relative %: 71 %
Platelets: 186 10*3/uL (ref 150–400)
RBC: 4.34 MIL/uL (ref 3.87–5.11)
RDW: 15.5 % (ref 11.5–15.5)
WBC: 4.8 10*3/uL (ref 4.0–10.5)
nRBC: 0 % (ref 0.0–0.2)

## 2022-07-20 LAB — TROPONIN I (HIGH SENSITIVITY)
Troponin I (High Sensitivity): 22 ng/L — ABNORMAL HIGH (ref <18)
Troponin I (High Sensitivity): 24 ng/L — ABNORMAL HIGH (ref <18)

## 2022-07-20 LAB — RESP PANEL BY RT-PCR (RSV, FLU A&B, COVID)  RVPGX2
Influenza A by PCR: NEGATIVE
Influenza B by PCR: NEGATIVE
Resp Syncytial Virus by PCR: NEGATIVE
SARS Coronavirus 2 by RT PCR: POSITIVE — AB

## 2022-07-20 LAB — LACTIC ACID, PLASMA: Lactic Acid, Venous: 1.3 mmol/L (ref 0.5–1.9)

## 2022-07-20 LAB — GLUCOSE, CAPILLARY
Glucose-Capillary: 115 mg/dL — ABNORMAL HIGH (ref 70–99)
Glucose-Capillary: 150 mg/dL — ABNORMAL HIGH (ref 70–99)

## 2022-07-20 LAB — BRAIN NATRIURETIC PEPTIDE: B Natriuretic Peptide: 678.7 pg/mL — ABNORMAL HIGH (ref 0.0–100.0)

## 2022-07-20 LAB — CBG MONITORING, ED: Glucose-Capillary: 143 mg/dL — ABNORMAL HIGH (ref 70–99)

## 2022-07-20 MED ORDER — VANCOMYCIN HCL 1750 MG/350ML IV SOLN
1750.0000 mg | INTRAVENOUS | Status: DC
  Filled 2022-07-20: qty 350

## 2022-07-20 MED ORDER — LOSARTAN POTASSIUM 25 MG PO TABS
25.0000 mg | ORAL_TABLET | Freq: Every day | ORAL | Status: DC
  Administered 2022-07-20 – 2022-07-25 (×6): 25 mg via ORAL
  Filled 2022-07-20 (×6): qty 1

## 2022-07-20 MED ORDER — EZETIMIBE 10 MG PO TABS
10.0000 mg | ORAL_TABLET | Freq: Every day | ORAL | Status: DC
  Administered 2022-07-20 – 2022-07-25 (×6): 10 mg via ORAL
  Filled 2022-07-20 (×6): qty 1

## 2022-07-20 MED ORDER — INSULIN ASPART 100 UNIT/ML IJ SOLN
0.0000 [IU] | Freq: Three times a day (TID) | INTRAMUSCULAR | Status: DC
  Administered 2022-07-20: 1 [IU] via SUBCUTANEOUS
  Administered 2022-07-21: 3 [IU] via SUBCUTANEOUS
  Administered 2022-07-22: 1 [IU] via SUBCUTANEOUS
  Administered 2022-07-22 (×2): 2 [IU] via SUBCUTANEOUS
  Administered 2022-07-23: 3 [IU] via SUBCUTANEOUS
  Administered 2022-07-24 (×2): 1 [IU] via SUBCUTANEOUS
  Filled 2022-07-20 (×8): qty 1

## 2022-07-20 MED ORDER — LORATADINE 10 MG PO TABS
10.0000 mg | ORAL_TABLET | Freq: Every day | ORAL | Status: DC
  Administered 2022-07-20 – 2022-07-25 (×6): 10 mg via ORAL
  Filled 2022-07-20 (×6): qty 1

## 2022-07-20 MED ORDER — FUROSEMIDE 10 MG/ML IJ SOLN
40.0000 mg | Freq: Two times a day (BID) | INTRAMUSCULAR | Status: DC
  Administered 2022-07-20 – 2022-07-21 (×3): 40 mg via INTRAVENOUS
  Filled 2022-07-20 (×3): qty 4

## 2022-07-20 MED ORDER — PANTOPRAZOLE SODIUM 40 MG PO TBEC
40.0000 mg | DELAYED_RELEASE_TABLET | Freq: Every day | ORAL | Status: DC
  Administered 2022-07-20 – 2022-07-25 (×6): 40 mg via ORAL
  Filled 2022-07-20 (×6): qty 1

## 2022-07-20 MED ORDER — FUROSEMIDE 10 MG/ML IJ SOLN
40.0000 mg | Freq: Once | INTRAMUSCULAR | Status: AC
  Administered 2022-07-20: 40 mg via INTRAVENOUS
  Filled 2022-07-20: qty 4

## 2022-07-20 MED ORDER — LEVETIRACETAM 500 MG PO TABS
500.0000 mg | ORAL_TABLET | Freq: Two times a day (BID) | ORAL | Status: DC
  Administered 2022-07-20 – 2022-07-25 (×11): 500 mg via ORAL
  Filled 2022-07-20 (×11): qty 1

## 2022-07-20 MED ORDER — INSULIN ASPART 100 UNIT/ML IJ SOLN
0.0000 [IU] | Freq: Every day | INTRAMUSCULAR | Status: DC
  Administered 2022-07-23: 2 [IU] via SUBCUTANEOUS
  Filled 2022-07-20: qty 1

## 2022-07-20 MED ORDER — VANCOMYCIN HCL IN DEXTROSE 1-5 GM/200ML-% IV SOLN: 1000.0000 mg | Freq: Two times a day (BID) | INTRAVENOUS | Status: DC

## 2022-07-20 MED ORDER — BACITRACIN-POLYMYXIN B 500-10000 UNIT/GM OP OINT
TOPICAL_OINTMENT | Freq: Four times a day (QID) | OPHTHALMIC | Status: DC
  Administered 2022-07-24: 1 via OPHTHALMIC
  Filled 2022-07-20: qty 3.5

## 2022-07-20 MED ORDER — CEFAZOLIN SODIUM-DEXTROSE 1-4 GM/50ML-% IV SOLN: 1.0000 g | Freq: Three times a day (TID) | INTRAVENOUS | Status: DC

## 2022-07-20 MED ORDER — APIXABAN 5 MG PO TABS
5.0000 mg | ORAL_TABLET | Freq: Two times a day (BID) | ORAL | Status: DC
  Administered 2022-07-20 – 2022-07-25 (×10): 5 mg via ORAL
  Filled 2022-07-20 (×10): qty 1

## 2022-07-20 MED ORDER — SODIUM CHLORIDE 0.9 % IV SOLN
2.0000 g | Freq: Two times a day (BID) | INTRAVENOUS | Status: DC
  Administered 2022-07-20 – 2022-07-21 (×3): 2 g via INTRAVENOUS
  Filled 2022-07-20 (×2): qty 12.5
  Filled 2022-07-20: qty 2

## 2022-07-20 MED ORDER — VANCOMYCIN HCL 1500 MG/300ML IV SOLN
1500.0000 mg | Freq: Once | INTRAVENOUS | Status: AC
  Administered 2022-07-20: 1500 mg via INTRAVENOUS
  Filled 2022-07-20: qty 300

## 2022-07-20 MED ORDER — DOCUSATE SODIUM 100 MG PO CAPS
100.0000 mg | ORAL_CAPSULE | Freq: Two times a day (BID) | ORAL | Status: DC
  Administered 2022-07-20 – 2022-07-25 (×10): 100 mg via ORAL
  Filled 2022-07-20 (×11): qty 1

## 2022-07-20 MED ORDER — FLUOXETINE HCL 20 MG PO CAPS
20.0000 mg | ORAL_CAPSULE | Freq: Every day | ORAL | Status: DC
  Administered 2022-07-20 – 2022-07-24 (×5): 20 mg via ORAL
  Filled 2022-07-20 (×5): qty 1

## 2022-07-20 MED ORDER — ONDANSETRON HCL 4 MG/2ML IJ SOLN
4.0000 mg | Freq: Four times a day (QID) | INTRAMUSCULAR | Status: DC | PRN
  Administered 2022-07-20: 4 mg via INTRAVENOUS
  Filled 2022-07-20: qty 2

## 2022-07-20 NOTE — ED Notes (Signed)
Assumed care from [place], [place]. Pt resting comfortably in bed at this time. Pt denies any current needs or questions. Call light with in reach.

## 2022-07-20 NOTE — ED Triage Notes (Signed)
Pt [facility]IB[facility] from [facility]. Per facility, pt had some right leg swelling yesterday and was given an extra dose of lasix. Today, swelling has spread to left leg. Redness noted to right lower leg, as well as pitting edema bilaterally.

## 2022-07-20 NOTE — ED Notes (Signed)
Pt family educated on patient fluid restriction of 1571m. Pt family given a cup with numbers on it to track how much she is drinking. Family understands with now additional questions.

## 2022-07-20 NOTE — Consult Note (Signed)
Pharmacy Antibiotic Note  Linda [name] is a 89 y.o. female admitted on 07/20/2022 with cellulitis.  Pharmacy has been consulted for vancomycin dosing.  Plan: Vancomycin 1500 mg IV x 1 given in ED  Vancomycin 1750 mg IV Q 36 hrs. Goal AUC 400-550. Expected AUC: 512/Cmin: 10.2 SCr used: 0.8, IBW/TBW, Vd 0.72   Height: 5' 3.5" (161.3 cm) Weight: 77.9 kg (171 lb 12.8 oz) (as of 1/1) IBW/kg (Calculated) : 53.55  Temp (24hrs), Avg:97.9 F (36.6 C), Min:97.9 F (36.6 C), Max:97.9 F (36.6 C)  Recent Labs  Lab 07/20/22 0849  WBC 4.8  CREATININE 0.62  LATICACIDVEN 1.3    Estimated Creatinine Clearance: 50.4 mL/min (by C-G formula based on SCr of 0.62 mg/dL).    Allergies  Allergen Reactions   Barium Iodide     Unknown   Lipitor [Atorvastatin] Other (See Comments)    Myalgias    Statins Other (See Comments)    Myalgias    Crestor [Rosuvastatin Calcium] Other (See Comments)    Myalgias    Penicillins Rash   Sulfa Antibiotics Rash   Sulfonamide Derivatives Rash    Antimicrobials this admission: Vancomycin 2/1  >>  Cefepime 2/1 >>   Dose adjustments this admission: N/A  Microbiology results: 2/1 BCx: sent   Thank you for allowing pharmacy to be a part of this patient's care.  Alison Murray 07/20/2022 12:08 PM

## 2022-07-20 NOTE — Progress Notes (Signed)
*  PRELIMINARY RESULTS* Echocardiogram 2D Echocardiogram has been performed.  Linda [name] 07/20/2022, 4:09 PM

## 2022-07-20 NOTE — ED Notes (Signed)
Pt placed on 2L [place]

## 2022-07-20 NOTE — ED Notes (Signed)
Pt reported feeling nauseous after medication administration. RN messaged MD Sira in regards to obtaining PRN zofran for pt.

## 2022-07-20 NOTE — Progress Notes (Signed)
Pt just arrived to unit, daughter at bedside.  Daughter informed that pt is covid + based on pcr that was done earlier this day.  Daughter very persistent that pt is not covid + and had covid before when she was at her nursing facility. Daughter informed of hosiptal policy regarding covid and informed that it is recommended that she wear a mask, grown and gloves when in the room with a pt.  Pt looked at this RN, rolled her eyes and said "ok".  Daughter did not don ppe.

## 2022-07-20 NOTE — ED Notes (Signed)
Pt is verbal, just very hard of hearing

## 2022-07-20 NOTE — Consult Note (Signed)
PHARMACY -  BRIEF ANTIBIOTIC NOTE   Pharmacy has received consult(s) for vancomycin dosing from an ED provider.  The patient's profile has been reviewed for ht/wt/allergies/indication/available labs.    One time order(s) placed for vancomycin 1500 mg IV x 1  Further antibiotics/pharmacy consults should be ordered by admitting physician if indicated.                       Thank you, Lorin Picket, PharmD 07/20/2022  8:49 AM

## 2022-07-20 NOTE — ED Provider Notes (Signed)
[facility] Provider Note    Event Date/Time   First MD Initiated Contact with Patient 07/20/22 [phone]     (approximate)   History   Leg Swelling   HPI  Linda [name] is a 89 y.o. female   Past medical history of stroke, seizures, hard of hearing, CHF, atrial fibrillation on Eliquis, who presents to the emergency department with bilateral lower extremity edema.  She comes from a memory facility.  Patient is disoriented to situation and very limited in giving a history.  She answers at questions appropriately, and denies pain, shortness of breath.  Bilateral pitting edema to legs to the calves and some red warm skin changes to the shin of the right lower extremity.  She has rales in both her lungs and has a new oxygen requirement of 2 L, on room air her oxygen saturation was 86%.  She is tachypneic.  Independent Historian contributed to assessment above: ems  External Medical Documents Reviewed: Discharge summary from January 2024 and September 2023 for stroke, status epilepticus, heart failure.  Recent discharge summary lists Eliquis as at home discharge medication as well as 20 mg of Lasix daily which is a new medication.     Physical Exam   Triage Vital Signs: ED Triage Vitals  Enc Vitals Group     BP      Pulse      Resp      Temp      Temp src      SpO2      Weight      Height      Head Circumference      Peak Flow      Pain Score      Pain Loc      Pain Edu?      Excl. in [place]?     Most recent vital signs: Vitals:   07/20/22 0845 07/20/22 0900  BP:  (!) 165/113  Pulse: (!) 59 74  Resp: 17 (!) 27  Temp:    SpO2: 95% 95%    General: Awake, no distress.  CV:  Good peripheral perfusion.  Resp:  Normal effort.  Abd:  No distention.  Other:  Awake alert disoriented to situation, bilateral pitting edema to lower extremities to mid calf with some cellulitic changes on the right shin, rales to mid lungs bilaterally with tachypnea  speaking in short sentences and oxygen saturation 86% on room air improved with 2 L.  Abdomen soft and nontender nontoxic-appearing.   ED Results / Procedures / Treatments   Labs (all labs ordered are listed, but only abnormal results are displayed) Labs Reviewed  RESP PANEL BY RT-PCR (RSV, FLU A&B, COVID)  [id number]  CULTURE, BLOOD (ROUTINE X 2)  CULTURE, BLOOD (ROUTINE X 2)  CBC WITH DIFFERENTIAL/PLATELET  BASIC METABOLIC PANEL  BRAIN NATRIURETIC PEPTIDE  LACTIC ACID, PLASMA  LACTIC ACID, PLASMA  TROPONIN I (HIGH SENSITIVITY)     I ordered and reviewed the above labs they are notable for normal white blood cell count  EKG  ED ECG REPORT I, Lucillie Garfinkel, the attending physician, personally viewed and interpreted this ECG.   Date: 07/20/2022  EKG Time: 0822  Rate: 83  Rhythm: AF rates 80s  Axis: nl  Intervals:none  ST&T Change: no acute ischemic changes    RADIOLOGY I independently reviewed and interpreted chest x-ray see diffuse infiltrate consistent with pulmonary edema   PROCEDURES:  Critical Care performed: Yes, see critical care procedure  note(s)  .Critical Care  Performed by: Lucillie Garfinkel, MD Authorized by: Lucillie Garfinkel, MD   Critical care provider statement:    Critical care time (minutes):  30   Critical care was time spent personally by me on the following activities:  Development of treatment plan with patient or surrogate, discussions with consultants, evaluation of patient's response to treatment, examination of patient, ordering and review of laboratory studies, ordering and review of radiographic studies, ordering and performing treatments and interventions, pulse oximetry, re-evaluation of patient's condition and review of old [facility] ED: Medications  vancomycin (VANCOREADY) IVPB 1500 mg/300 mL (has no administration in time range)  furosemide (LASIX) injection 40 mg (40 mg Intravenous Given 07/20/22 0856)    External  physician / consultants:  I spoke with hospitalist regarding care plan for this patient.   IMPRESSION / MDM / ASSESSMENT AND PLAN / ED COURSE  I reviewed the triage vital signs and the nursing notes.                                Patient's presentation is most consistent with acute presentation with potential threat to life or bodily function.  Differential diagnosis includes, but is not limited to, heart failure exacerbation, fluid overload/pulmonary edema, cellulitis, ACS, PE, sepsis   The patient is on the cardiac monitor to evaluate for evidence of arrhythmia and/or significant heart rate changes.  MDM: This is a patient with clinical evidence of fluid overload history of heart failure with bilateral pitting legs as well as rales and new oxygen requirement will give IV Lasix for diuresis.  She also has some cellulitic changes to the right lower extremity so we will order IV vancomycin antibiotic.  No respiratory distress, will defer BiPAP for this time being and continue monitoring.    Check EKG and serial troponins for ACS.  Check viral swabs for infection, home visit note indicates positive COVID test recently --- I spoke with daughter Cari Caraway on the phone who states that mother was positive for COVID weeks ago and has mostly resolved symptoms, I also informed her of our plan for admission for CHF exacerbation and cellulitis.  Doubt PE given on Eliquis.  Admission.         FINAL CLINICAL IMPRESSION(S) / ED DIAGNOSES   Final diagnoses:  Bilateral lower extremity edema  Acute on chronic congestive heart failure, unspecified heart failure type (HCC)  Hypoxia  Cellulitis of right lower extremity     Rx / DC Orders   ED Discharge Orders     None        Note:  This document was prepared using Dragon voice recognition software and may include unintentional dictation errors.    Lucillie Garfinkel, MD 07/20/22 [phone]

## 2022-07-20 NOTE — ED Notes (Signed)
Provided pt with peanut butter and crackers

## 2022-07-20 NOTE — TOC Initial Note (Signed)
Transition of Care [facility]) - Initial/Assessment Note    Patient Details  Name: Linda [name] MRN: [id number] Date of Birth: December 07, 1933  [facility] CM/SW Contact:    Candie Chroman, LCSW Phone Number: 07/20/2022, 2:09 PM  Clinical Narrative:  Per chart review, patient admitted from [facility]. CSW called admissions coordinator who stated patient just discharged from their SNF yesterday around 11:00 and went back to ALF. Last night they had to move her to their memory care unit. CSW will follow progress and facilitate return to [facility] once medically stable.                Expected Discharge Plan: Assisted Living Barriers to Discharge: Continued Medical Work up   Patient Goals and CMS Choice            Expected Discharge Plan and Services       Living arrangements for the past 2 months: [place]                                      Prior Living Arrangements/Services Living arrangements for the past 2 months: [place] Lives with:: Facility Resident Patient language and need for interpreter reviewed:: Yes        Need for Family Participation in Patient Care: Yes (Comment) Care giver support system in place?: Yes (comment)   Criminal Activity/Legal Involvement Pertinent to Current Situation/Hospitalization: No - Comment as needed  Activities of Daily Living Home Assistive Devices/Equipment: Walker (specify type) ADL Screening (condition at time of admission) Patient's cognitive ability adequate to safely complete daily activities?: Yes Is the patient deaf or have difficulty hearing?: Yes Does the patient have difficulty seeing, even when wearing glasses/contacts?: No Does the patient have difficulty concentrating, remembering, or making decisions?: Yes Patient able to express need for assistance with ADLs?: Yes Does the patient have difficulty dressing or bathing?: No Independently performs ADLs?:  No Communication: Independent Dressing (OT): Needs assistance Is this a change from baseline?: Pre-admission baseline Grooming: Independent Feeding: Independent Bathing: Needs assistance Is this a change from baseline?: Pre-admission baseline Toileting: Needs assistance Is this a change from baseline?: Pre-admission baseline In/Out Bed: Needs assistance Is this a change from baseline?: Pre-admission baseline Walks in Home: Needs assistance Is this a change from baseline?: Change from baseline, expected to last <3 days Does the patient have difficulty walking or climbing stairs?: Yes Weakness of Legs: Both Weakness of Arms/Hands: None  Permission Sought/Granted         Permission granted to share info w AGENCY: [facility]        Emotional Assessment         Alcohol / Substance Use: Not Applicable Psych Involvement: No (comment)  Admission diagnosis:  CHF (congestive heart failure) ([place]) [I50.9] Hypoxia [R09.02] Cellulitis of right lower extremity [L03.115] Bilateral lower extremity edema [R60.0] Acute on chronic congestive heart failure, unspecified heart failure type [facility]) [I50.9] Patient Active Problem List   Diagnosis Date Noted   CHF (congestive heart failure) ([place]) 07/20/2022   Seizures ([place]) 06/22/2022   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   Acute ischemic left MCA stroke ([place]) 02/08/2022   Status post stroke 02/08/2022   Middle cerebral artery embolism, left 02/08/2022   Endotracheal tube present    Chronic HFrEF (heart failure with reduced ejection fraction) (HCC)    NSVT (nonsustained ventricular tachycardia) (  [place]) 02/06/2022   Hypoxia 02/05/2022   Chronic anticoagulation 02/05/2022   HFrEF (heart failure with reduced ejection fraction) ([place]) 02/05/2022   Pulmonary hypertension ([place]) 02/05/2022   Valvular cardiomyopathy ([place]) 10/22/2021   S/P mitral valve clip implantation 10/05/2021   Varicose veins of both lower extremities  07/13/2021   Injury of right leg 07/06/2021   Right leg pain 07/06/2021   Callus of foot 07/06/2021   Proximal muscle weakness 08/24/2020   Severe mitral regurgitation s/p MitraClip June 2022 07/09/2020   Fatigue 05/29/2020   Diuretic-induced hypokalemia 12/16/2019   Myalgia due to statin 12/15/2019   Acquired thrombophilia ([place]) 12/15/2019   Skin lesion 08/27/2018   Hospital discharge follow-up 05/25/2018   Abnormal chest x-ray 05/25/2018   History of CVA (cerebrovascular accident) without residual deficits 05/10/2018   Parent-child estrangement nec 02/09/2018   Basal cell carcinoma (BCC) of right lower extremity 08/08/2017   Hematuria, gross 08/08/2017   Extremity atherosclerosis with intermittent claudication ([place]) 08/08/2017   Generalized anxiety disorder 01/31/2017   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   Controlled type 2 diabetes mellitus with microalbuminuria, without long-term current use of insulin ([place]) 01/20/2016   Insomnia secondary to anxiety 10/09/2015   Polycythemia, secondary 07/04/2015   Pre-syncope 07/02/2015   Osteopenia 08/21/2014   Gastritis 05/26/2014   Epistaxis 03/18/2014   GERD (gastroesophageal reflux disease) 03/04/2014   Medicare annual wellness visit, subsequent 01/13/2014   Essential hypertension 11/04/2013   Screening for breast cancer 07/01/2013   Left shoulder pain 03/21/2013   Hoarseness of voice 03/21/2013   Edema 09/05/2010   VENTRICULAR HYPERTROPHY, LEFT 08/16/2010   Hyperlipidemia 06/03/2010   ATRIAL FIBRILLATION 08/13/2009   PCP:  Kirk Ruths, MD Pharmacy:   [place], [place] - [address] [place] [address] Phone: [phone] Fax: [phone]  [facility] - [place], [place] - [address] [place] [address] Phone: [phone] Fax: [phone]     Social Determinants of Health (SDOH) Social History: SDOH Screenings   Food  Insecurity: No Food Insecurity (07/20/2022)  Housing: Low Risk  (07/20/2022)  Transportation Needs: No Transportation Needs (07/20/2022)  Utilities: Not At Risk (07/20/2022)  Depression (PHQ2-9): Low Risk  (11/24/2021)  Financial Resource Strain: Low Risk  (11/01/2021)  Physical Activity: Inactive (08/25/2020)  Social Connections: Moderately Integrated (11/01/2021)  Stress: No Stress Concern Present (11/01/2021)  Tobacco Use: Low Risk  (07/20/2022)   SDOH Interventions:     Readmission Risk Interventions     No data to display

## 2022-07-20 NOTE — H&P (Signed)
History and Physical    Patient: Linda [name] DOB: 04-Nov-1933 DOA: 07/20/2022 DOS: the patient was seen and examined on 07/20/2022 PCP: Kirk Ruths, MD  Patient coming from: SNF  Chief Complaint:  Chief Complaint  Patient presents with   Leg Swelling   HPI: Linda [name] is a 89 y.o. female who presents to the hospital from her memory facility ([facility]) with lower extremity edema. Pt is a poor historian and hard of hearing. Thus, history obtained via EMR. She indeed does present with lower extremity edema and R leg erythema. Upon arrival to the ER the pt was noted to require 2L [place]. CXR showed findings concerning for pulmonary edema. This was further substantiated with an elevated proBNP on arrival today. Moreover, she is also COVID +, however, the pt's daughter advised that this is not a new finding and she has been COVID + for several weeks. As a result, of the pt's presentation, oxygen requirements and lab findings she will be admitted for further management.   Review of Systems: As mentioned in the history of present illness. All other systems reviewed and are negative. Past Medical History:  Diagnosis Date   Anxiety    Arrhythmia    paroxysmal atrial fibrillation   CHF (congestive heart failure) (HCC)    Colon polyp    Diverticulitis    Dysrhythmia    GERD (gastroesophageal reflux disease)    Hard of hearing    Hoarseness    Hyperlipidemia    Hypertension    Kidney stone    Motion sickness    Parathyroid disease (HCC)    Parathyroidectomy    PONV (postoperative nausea and vomiting)    Pre-diabetes    PVC (premature ventricular contraction)    Skin cancer    Squamous cell carcinoma of skin 07/19/2021   right lower pretibia lateral, EDC   Squamous cell carcinoma of skin 10/13/2014   R ant neck - KA pattern   Squamous cell carcinoma of skin 09/15/2021   Left dorsal hand, shaved down to fat at time of biopsy so should already be removed.   Will observe for recurrence.   Stroke [facility])    TIA (transient ischemic attack) 04/2018   no deficitis   UTI (lower urinary tract infection)    Varicose veins of both lower extremities    Wears hearing aid in both ears    Past Surgical History:  Procedure Laterality Date   ABDOMINAL HYSTERECTOMY     APPENDECTOMY     CATARACT EXTRACTION W/PHACO Left 03/05/2019   Procedure: CATARACT EXTRACTION PHACO AND INTRAOCULAR LENS PLACEMENT ([place])   0:57 15.6% 9.05;  Surgeon: Leandrew Koyanagi, MD;  Location: [place];  Service: Ophthalmology;  Laterality: Left;  Diabetic - diet cotrolled   CATARACT EXTRACTION W/PHACO Right 04/09/2019   Procedure: CATARACT EXTRACTION PHACO AND INTRAOCULAR LENS PLACEMENT (IOC) RIGHT DIABETIC 00:47.6  15.9%  7.60;  Surgeon: Leandrew Koyanagi, MD;  Location: [place];  Service: Ophthalmology;  Laterality: Right;   CHOLECYSTECTOMY  2002   ESOPHAGOGASTRODUODENOSCOPY (EGD) WITH PROPOFOL N/A 12/17/2019   Procedure: ESOPHAGOGASTRODUODENOSCOPY (EGD) WITH PROPOFOL;  Surgeon: Robert Bellow, MD;  Location: [facility];  Service: Endoscopy;  Laterality: N/A;   IR CT HEAD LTD  02/08/2022   IR PERCUTANEOUS ART THROMBECTOMY/INFUSION INTRACRANIAL INC DIAG ANGIO  02/08/2022   LITHOTRIPSY     MITRAL VALVE REPAIR  12/14/2020   PARATHYROIDECTOMY  2004   1 removed   RADIOLOGY WITH ANESTHESIA N/A  02/08/2022   Procedure: IR WITH ANESTHESIA;  Surgeon: Luanne Bras, MD;  Location: [place];  Service: Radiology;  Laterality: N/A;   ROTATOR CUFF REPAIR  2002   TEE WITHOUT CARDIOVERSION N/A 09/29/2020   Procedure: TRANSESOPHAGEAL ECHOCARDIOGRAM (TEE);  Surgeon: Teodoro Spray, MD;  Location: [facility];  Service: Cardiovascular;  Laterality: N/A;   TONSILLECTOMY  1956   TONSILLECTOMY     TOTAL ABDOMINAL HYSTERECTOMY W/ BILATERAL SALPINGOOPHORECTOMY  1984   VEIN LIGATION AND STRIPPING     Social History:  reports that she has never smoked. She has never used  smokeless tobacco. She reports that she does not drink alcohol and does not use drugs.  Allergies  Allergen Reactions   Barium Iodide     Unknown   Lipitor [Atorvastatin] Other (See Comments)    Myalgias    Statins Other (See Comments)    Myalgias    Crestor [Rosuvastatin Calcium] Other (See Comments)    Myalgias    Penicillins Rash   Sulfa Antibiotics Rash   Sulfonamide Derivatives Rash    Family History  Problem Relation Age of Onset   Heart disease Brother    Stroke Mother    Skin cancer Father    Breast cancer Paternal Grandmother     Prior to Admission medications   Medication Sig Start Date End Date Taking? Authorizing Provider  apixaban (ELIQUIS) 5 MG TABS tablet Take 1 tablet (5 mg total) by mouth 2 (two) times daily. 02/24/22  Yes Angiulli, Lavon Paganini, PA-C  B Complex-C (B-COMPLEX WITH VITAMIN C) tablet Take 1 tablet by mouth daily. 02/24/22  Yes Angiulli, Lavon Paganini, PA-C  cholecalciferol (CHOLECALCIFEROL) 25 MCG tablet Take 1 tablet (1,000 Units total) by mouth daily. 02/24/22  Yes Angiulli, Lavon Paganini, PA-C  docusate sodium (COLACE) 100 MG capsule Take 1 capsule (100 mg total) by mouth 2 (two) times daily. 02/13/22  Yes de Yolanda Manges, Cortney E, NP  ezetimibe (ZETIA) 10 MG tablet Take 1 tablet (10 mg total) by mouth daily. 02/14/22  Yes de Yolanda Manges, Cortney E, NP  FLUoxetine (PROZAC) 20 MG capsule Take 1 capsule (20 mg total) by mouth at bedtime. Patient taking differently: Take 20 mg by mouth daily. 02/24/22  Yes Angiulli, Lavon Paganini, PA-C  furosemide (LASIX) 20 MG tablet Take 1 tablet (20 mg total) by mouth daily. 02/24/22  Yes Angiulli, Lavon Paganini, PA-C  levETIRAcetam (KEPPRA) 500 MG tablet Take 1 tablet (500 mg total) by mouth 2 (two) times daily. 06/27/22  Yes Oswald Hillock, MD  loratadine (CLARITIN) 10 MG tablet Take 1 tablet (10 mg total) by mouth daily. 02/24/22  Yes Angiulli, Lavon Paganini, PA-C  losartan (COZAAR) 25 MG tablet Take 1 tablet (25 mg total) by mouth daily. 06/27/22 06/27/23 Yes  Oswald Hillock, MD  magnesium gluconate (MAGONATE) 500 MG tablet Take 0.5 tablets (250 mg total) by mouth at bedtime. 02/24/22  Yes Angiulli, Lavon Paganini, PA-C  melatonin 3 MG TABS tablet Take 1 tablet (3 mg total) by mouth at bedtime. 02/24/22  Yes Angiulli, Lavon Paganini, PA-C  metFORMIN (GLUCOPHAGE) 500 MG tablet Take 1 tablet (500 mg total) by mouth 2 (two) times daily with a meal. 02/27/22  Yes Angiulli, Lavon Paganini, PA-C  pantoprazole (PROTONIX) 40 MG tablet Take 1 tablet (40 mg total) by mouth daily at 12 noon. Patient taking differently: Take 40 mg by mouth daily. 02/24/22  Yes Angiulli, Lavon Paganini, PA-C  potassium chloride (KLOR-CON) 20 MEQ packet Take 20 mEq by mouth  daily.   Yes [provider]  acetaminophen (TYLENOL) 325 MG tablet Take 2 tablets (650 mg total) by mouth every 4 (four) hours as needed for mild pain (or temp > 37.5 C (99.5 F)). Patient not taking: Reported on 06/21/2022 02/24/22   Angiulli, Lavon Paganini, PA-C  alum & mag hydroxide-simeth (MAALOX PLUS) 400-400-40 MG/5ML suspension Take 30 mLs by mouth every 4 (four) hours as needed for indigestion.    [provider]  insulin aspart (NOVOLOG) 100 UNIT/ML injection Inject 0-9 Units into the skin 3 (three) times daily with meals. Sliding scale insulin Less than 70 initiate hypoglycemia protocol 70-120  0 units 120-150 1 unit 151-200 2 units 201-250 3 units 251-300 5 units 301-350 7 units 351-400 9 units  Greater than 400 call MD 06/27/22   Oswald Hillock, MD    Physical Exam: Vitals:   07/20/22 0930 07/20/22 1000 07/20/22 1030 07/20/22 1100  BP: (!) 161/108 (!) 128/115 104/73 (!) 115/98  Pulse: 81 88 87 85  Resp: (!) 30 (!) 21 (!) 32 (!) 29  Temp:      TempSrc:      SpO2: 97% 97% 100% 96%  Weight:      Height:       Physical Exam Constitutional:      Appearance: Normal appearance.  HENT:     Head: Normocephalic and atraumatic.     Mouth/Throat:     Mouth: Mucous membranes are moist.  Cardiovascular:     Rate and  Rhythm: Normal rate and regular rhythm.  Pulmonary:     Breath sounds: Rales present.  Abdominal:     General: Abdomen is flat.     Palpations: Abdomen is soft.  Musculoskeletal:        General: Swelling present.     Cervical back: Neck supple.  Skin:    General: Skin is warm.     Comments: R lower leg erythema   Neurological:     Mental Status: She is alert. She is disoriented.  Psychiatric:        Mood and Affect: Mood normal.     Data Reviewed:  There are no new results to review at this time.  Assessment and Plan:  Acute hypoxic resp failure due to acute diastolic CHF  - [place] 2L (wean as tolerated) - Previous ECHO from 02/09/2022 showed LV 55% (LV severely dilated) - ECHO ordered 07/20/2022 - IV lasix 40 mg bid - Strict I and O  - [place] healthy diet with 1.5 L daily fluid restriction  - Cardiac monitoring   2. R lower leg cellulitis  - IV vancomycin 1 g q12 w/ pharmacy consult  - IV cefepime 2g q12  - F/u blood cx's from the ER   3. COVID +  - Per family this is not a new finding (pt has been COVID + already)  4. DM  - Novolog ACHS   5. Chronic afib  - Eliquis 5 mg PO bid   6. HTN  - Losartan 25 mg PO daily   7. HLD  - Zetia 10 mg PO daily   8. Post stroke epilepsy  - Keppra 500 mg PO bid (last DC summary advised continue keppra indefinitely)   DVT prophylaxis: Eliquis as above  GI Prophylaxis: Protonix 40 mg PO daily    Advance Care Planning:   Code Status: Prior DNR (DNR paperwork was in the ER at bedside)   Severity of Illness: The appropriate patient status for this patient is  INPATIENT. Inpatient status is judged to be reasonable and necessary in order to provide the required intensity of service to ensure the patient's safety. The patient's presenting symptoms, physical exam findings, and initial radiographic and laboratory data in the context of their chronic comorbidities is felt to place them at high risk for further clinical deterioration.  Furthermore, it is not anticipated that the patient will be medically stable for discharge from the hospital within 2 midnights of admission.   * I certify that at the point of admission it is my clinical judgment that the patient will require inpatient hospital care spanning beyond 2 midnights from the point of admission due to high intensity of service, high risk for further deterioration and high frequency of surveillance required.*  Author: Lucienne Minks , MD 07/20/2022 11:15 AM  For on call review [email or web address].

## 2022-07-21 DIAGNOSIS — I509 Heart failure, unspecified: Secondary | ICD-10-CM | POA: Diagnosis not present

## 2022-07-21 LAB — ECHOCARDIOGRAM COMPLETE
AR max vel: 1.98 cm2
AV Area VTI: 1.53 cm2
AV Area mean vel: 1.64 cm2
AV Mean grad: 5.5 mmHg
AV Peak grad: 11.2 mmHg
Ao pk vel: 1.67 m/s
Area-P 1/2: 2.02 cm2
Height: 63.5 in
MV M vel: 5.43 m/s
MV Peak grad: 117.9 mmHg
MV VTI: 1.2 cm2
Radius: 0.85 cm
S' Lateral: 3.6 cm
Weight: 2748.8 oz

## 2022-07-21 LAB — CBC
HCT: 39.5 % (ref 36.0–46.0)
Hemoglobin: 12.6 g/dL (ref 12.0–15.0)
MCH: 31.3 pg (ref 26.0–34.0)
MCHC: 31.9 g/dL (ref 30.0–36.0)
MCV: 98 fL (ref 80.0–100.0)
Platelets: 164 10*3/uL (ref 150–400)
RBC: 4.03 MIL/uL (ref 3.87–5.11)
RDW: 15.4 % (ref 11.5–15.5)
WBC: 5.6 10*3/uL (ref 4.0–10.5)
nRBC: 0 % (ref 0.0–0.2)

## 2022-07-21 LAB — COMPREHENSIVE METABOLIC PANEL
ALT: 20 U/L (ref 0–44)
AST: 26 U/L (ref 15–41)
Albumin: 3.3 g/dL — ABNORMAL LOW (ref 3.5–5.0)
Alkaline Phosphatase: 64 U/L (ref 38–126)
Anion gap: 10 (ref 5–15)
BUN: 17 mg/dL (ref 8–23)
CO2: 29 mmol/L (ref 22–32)
Calcium: 8.8 mg/dL — ABNORMAL LOW (ref 8.9–10.3)
Chloride: 100 mmol/L (ref 98–111)
Creatinine, Ser: 0.81 mg/dL (ref 0.44–1.00)
GFR, Estimated: >60 mL/min (ref >60)
Glucose, Bld: 109 mg/dL — ABNORMAL HIGH (ref 70–99)
Potassium: 3.4 mmol/L — ABNORMAL LOW (ref 3.5–5.1)
Sodium: 139 mmol/L (ref 135–145)
Total Bilirubin: 1.7 mg/dL — ABNORMAL HIGH (ref 0.3–1.2)
Total Protein: 6.3 g/dL — ABNORMAL LOW (ref 6.5–8.1)

## 2022-07-21 LAB — GLUCOSE, CAPILLARY
Glucose-Capillary: 107 mg/dL — ABNORMAL HIGH (ref 70–99)
Glucose-Capillary: 206 mg/dL — ABNORMAL HIGH (ref 70–99)
Glucose-Capillary: 117 mg/dL — ABNORMAL HIGH (ref 70–99)
Glucose-Capillary: 169 mg/dL — ABNORMAL HIGH (ref 70–99)

## 2022-07-21 LAB — PHOSPHORUS: Phosphorus: 5.1 mg/dL — ABNORMAL HIGH (ref 2.5–4.6)

## 2022-07-21 LAB — MAGNESIUM: Magnesium: 1.8 mg/dL (ref 1.7–2.4)

## 2022-07-21 LAB — C-REACTIVE PROTEIN: CRP: 0.9 mg/dL (ref <1.0)

## 2022-07-21 MED ORDER — VANCOMYCIN HCL IN DEXTROSE 1-5 GM/200ML-% IV SOLN
1000.0000 mg | INTRAVENOUS | Status: DC
  Filled 2022-07-21 (×2): qty 200

## 2022-07-21 MED ORDER — CHLORHEXIDINE GLUCONATE CLOTH 2 % EX PADS
6.0000 | MEDICATED_PAD | Freq: Every day | CUTANEOUS | Status: DC
  Administered 2022-07-22: 6 via TOPICAL

## 2022-07-21 MED ORDER — CEFAZOLIN SODIUM-DEXTROSE 1-4 GM/50ML-% IV SOLN
1.0000 g | Freq: Three times a day (TID) | INTRAVENOUS | Status: DC
  Administered 2022-07-21 – 2022-07-22 (×2): 1 g via INTRAVENOUS
  Filled 2022-07-21 (×3): qty 50

## 2022-07-21 MED ORDER — POTASSIUM CHLORIDE CRYS ER 20 MEQ PO TBCR
40.0000 meq | EXTENDED_RELEASE_TABLET | Freq: Once | ORAL | Status: AC
  Administered 2022-07-21: 40 meq via ORAL
  Filled 2022-07-21: qty 2

## 2022-07-21 NOTE — Progress Notes (Signed)
  Progress Note   Patient: Linda [name] YIF:[id number] DOB: 1933/12/05 DOA: 07/20/2022     1 DOS: the patient was seen and examined on 07/21/2022   Brief hospital course:  Assessment and Plan:  Acute hypoxic resp failure due to acute diastolic CHF  - Previous ECHO from 02/09/2022 showed LV 55% (LV severely dilated) - ECHO ordered 07/20/2022 is complete but report pending  - IV lasix 40 mg bid - Strict I and O  - [place] healthy diet with 1.5 L daily fluid restriction  - Cardiac monitoring    2. R lower leg cellulitis  - IV cefazolin 1 g q8hr  - F/u blood cx's from the ER (so far negative)   3. COVID +  - Per family this is not a new finding (pt has been COVID + already)   4. DM  - Novolog ACHS    5. Chronic afib  - Eliquis 5 mg PO bid    6. HTN  - Losartan 25 mg PO daily    7. HLD  - Zetia 10 mg PO daily    8. Post stroke epilepsy  - Keppra 500 mg PO bid (last DC summary advised continue keppra indefinitely)    DVT prophylaxis: Eliquis as above  GI Prophylaxis: Protonix 40 mg PO daily      Subjective: Pt seen and examined at the bedside. Breathing is improved. RR has returned to normal. She continues on IV lasix. ECHO report remains pending. IV antibx's changed to ancef as the pt's lower leg cellulitis is much improved.   Physical Exam: Vitals:   07/20/22 2019 07/20/22 2354 07/21/22 0501 07/21/22 0833  BP: 111/79 116/70 106/73 (!) 153/102  Pulse: 87 86 81 94  Resp: '16 19 20 16  '$ Temp: 100.1 F (37.8 C) 98.7 F (37.1 C) 97.7 F (36.5 C)   TempSrc: Oral Oral Oral   SpO2: 95% 94% 90% (!) 85%  Weight:      Height:       Constitutional:      Appearance: Normal appearance.  HENT:     Head: Normocephalic and atraumatic.     Mouth/Throat:     Mouth: Mucous membranes are moist.  Cardiovascular:     Rate and Rhythm: Normal rate and regular rhythm.  Pulmonary:     Breath sounds: Improving aeration b/l  Abdominal:     General: Abdomen is flat.     Palpations:  Abdomen is soft.  Musculoskeletal:        General: 1+ edema lower legs     Cervical back: Neck supple.  Skin:    General: Skin is warm.     Comments: R lower leg erythema improved  Neurological:     Mental Status: She is alert. She is disoriented.  Psychiatric:        Mood and Affect: Mood normal.   Data Reviewed:   Disposition: Status is: Inpatient  Planned Discharge Destination:  PT consult pending    Time spent: 35 minutes  Author: Lucienne Minks , MD 07/21/2022 12:06 PM  For on call review [email or web address].

## 2022-07-21 NOTE — Consult Note (Addendum)
Pharmacy Antibiotic Note  Linda [name] is a 89 y.o. female admitted on 07/20/2022 with cellulitis.  Pharmacy has been consulted for vancomycin dosing. Low grade fever with a tmax of 100.1. WBC WNL. Pt on presentation has a nonpurulent cellulitis. Hx of MSSA in wound culture on 08/2021. Pt is positive for COVID.   Plan: Patient received vancomycin loading dose 1500 mg x 1 in the ED. Pt was then ordered vancomycin 1750 mg q36H. Will adjust vancomycin to 1000 mg q24H. Predicted AUC of 452. Goal AUC of 400-600. Scr 0.81, IBW, Vd 0.72. Plan to order vancomycin levels after the 4th or 5th dose.   Addendum: Discussed with MD and plan is to de-escalate abx to ancef 1 g TID. Discontinue cefepime and vancomycin.   Height: 5' 3.5" (161.3 cm) Weight: 77.9 kg (171 lb 12.8 oz) (as of 1/1) IBW/kg (Calculated) : 53.55  Temp (24hrs), Avg:98.8 F (37.1 C), Min:97.7 F (36.5 C), Max:100.1 F (37.8 C)  Recent Labs  Lab 07/20/22 0849 07/21/22 0543  WBC 4.8 5.6  CREATININE 0.62 0.81  LATICACIDVEN 1.3  --      Estimated Creatinine Clearance: 49.8 mL/min (by C-G formula based on SCr of 0.81 mg/dL).    Allergies  Allergen Reactions   Barium Iodide     Unknown   Lipitor [Atorvastatin] Other (See Comments)    Myalgias    Statins Other (See Comments)    Myalgias    Crestor [Rosuvastatin Calcium] Other (See Comments)    Myalgias    Penicillins Rash   Sulfa Antibiotics Rash   Sulfonamide Derivatives Rash    Antimicrobials this admission: Vancomycin 2/1  >> 2/2 Cefepime 2/1 >> 2/2 Ancef 2/2 >>   Dose adjustments this admission: N/A  Microbiology results: 2/1 BCx: pending.    Thank you for allowing pharmacy to be a part of this patient's care.  Oswald Hillock, PharmD, BCPS.  07/21/2022 11:48 AM

## 2022-07-21 NOTE — Discharge Instructions (Signed)

## 2022-07-21 NOTE — Progress Notes (Signed)
   Attempted to meet with patient and her daughter x 2 today.  The first time she had a Zoom call scheduled and I told her I would circle back.  The second attempt daughter had left to go to her mother's facility to get her hearing aids.  Pricilla Riffle RN CHFN

## 2022-07-21 NOTE — Evaluation (Signed)
Physical Therapy Evaluation Patient Details Name: Linda [name] MRN: [id number] DOB: 1933/11/03 Today's Date: 07/21/2022  History of Present Illness  Pt is an 89 y.o. female presenting to hospital 07/20/22 with c/o B LE swelling and R leg erythema.  (+) COVID but per pt's daughter that is not a new finding and pt has been COVID + for several weeks.  Pt admitted with acute hypoxic respiratory failure d/t acute diastolic CHF; R lower leg cellulitis.  PMH includes stroke, seizures, HOH, CHF, a-fib on Eliquis, anxiety, UTI, RCR, and post stroke epilepsy.  Clinical Impression  Prior to hospital admission, pt was most recently ambulatory with walker; lives at ALF.  Pt sleeping upon PT arrival but woken with vc's and tactile cues from therapist and pt's daughter.  Pt drowsy most of session (requiring cueing to open eyes and stay alert) but pt became alert enough to sit on edge of bed with min to mod assist, stand up to RW with min assist, and walk a few feet bed to recliner with CGA to min assist and walker use.  Pt with minimal verbalization during session; pt with h/o aphasia.  Pt would benefit from skilled PT to address noted impairments and functional limitations (see below for any additional details).  Upon hospital discharge, pt would benefit from SNF at this time.    Recommendations for follow up therapy are one component of a multi-disciplinary discharge planning process, led by the attending physician.  Recommendations may be updated based on patient status, additional functional criteria and insurance authorization.  Follow Up Recommendations Skilled nursing-short term rehab (<3 hours/day) Can patient physically be transported by private vehicle: No    Assistance Recommended at Discharge Frequent or constant Supervision/Assistance  Patient can return home with the following  A little help with walking and/or transfers;A little help with bathing/dressing/bathroom;Assistance with  cooking/housework;Direct supervision/assist for medications management;Assist for transportation;Help with stairs or ramp for entrance    Equipment Recommendations Rolling walker (2 wheels);BSC/3in1;Wheelchair (measurements PT);Wheelchair cushion (measurements PT)  Recommendations for Other Services  OT consult    Functional Status Assessment Patient has had a recent decline in their functional status and demonstrates the ability to make significant improvements in function in a reasonable and predictable amount of time.     Precautions / Restrictions Precautions Precautions: Fall Restrictions Weight Bearing Restrictions: No      Mobility  Bed Mobility Overal bed mobility: Needs Assistance Bed Mobility: Supine to Sit     Supine to sit: Min assist, Mod assist, HOB elevated     General bed mobility comments: vc's and a little assist to initiate movement and scoot to edge of bed    Transfers Overall transfer level: Needs assistance Equipment used: Rolling walker (2 wheels) Transfers: Sit to/from Stand Sit to Stand: Min assist           General transfer comment: min assist to stand and control descent sitting    Ambulation/Gait Ambulation/Gait assistance: Min guard, Min assist Gait Distance (Feet): 3 Feet (bed to recliner) Assistive device: Rolling walker (2 wheels)   Gait velocity: decreased     General Gait Details: increased effort/time to take steps bed to recliner with RW use  Stairs            Wheelchair Mobility    Modified Rankin (Stroke Patients Only)       Balance Overall balance assessment: Needs assistance Sitting-balance support: Bilateral upper extremity supported, Feet supported Sitting balance-Leahy Scale: Fair Sitting balance - Comments: steady static  sitting   Standing balance support: Bilateral upper extremity supported, During functional activity, Reliant on assistive device for balance Standing balance-Leahy Scale:  Poor Standing balance comment: initial min assist for standing balance (d/t posterior lean) but improved to CGA                             Pertinent Vitals/Pain Pain Assessment Pain Assessment: Faces Faces Pain Scale: No hurt Pain Intervention(s): Limited activity within patient's tolerance, Monitored during session, Repositioned Vitals (HR and O2 on 2 L via nasal cannula) stable and WFL throughout treatment session.    Home Living Family/patient expects to be discharged to:: Assisted living                 Home Equipment: Conservation officer, nature (2 wheels) Additional Comments: Pt lives at [facility] ([place]    Prior Function Prior Level of Function : Needs assist             Mobility Comments: Ambulatory with walker.       Hand Dominance        Extremity/Trunk Assessment   Upper Extremity Assessment Upper Extremity Assessment: Generalized weakness (Difficult to assess d/t pt's drowsiness)    Lower Extremity Assessment Lower Extremity Assessment: Generalized weakness (Difficult to assess d/t pt's drowsiness)       Communication   Communication: Receptive difficulties;Expressive difficulties;HOH  Cognition Arousal/Alertness: Lethargic (pt drowsy during session) Behavior During Therapy: Flat affect Overall Cognitive Status: Difficult to assess                                 General Comments: Pt with minimal verbalization during session (normally A&Ox4 per pt's daughter).        General Comments  Nursing cleared pt for participation in physical therapy.  Pt's daughter agreeable to therapy.  Therapist offered to assist pt back to bed end of session d/t pt sleeping in recliner but pt's daughter requesting pt stay in recliner for now.    Exercises     Assessment/Plan    PT Assessment Patient needs continued PT services  PT Problem List Decreased strength;Decreased activity tolerance;Decreased balance;Decreased  mobility;Decreased safety awareness       PT Treatment Interventions DME instruction;Gait training;Functional mobility training;Therapeutic activities;Therapeutic exercise;Balance training;Patient/family education    PT Goals (Current goals can be found in the Care Plan section)  Acute Rehab PT Goals Patient Stated Goal: to improve mobility and go back to ALF PT Goal Formulation: With family Time For Goal Achievement: 08/04/22 Potential to Achieve Goals: Good    Frequency Min 2X/week     Co-evaluation               AM-PAC PT "6 Clicks" Mobility  Outcome Measure Help needed turning from your back to your side while in a flat bed without using bedrails?: None Help needed moving from lying on your back to sitting on the side of a flat bed without using bedrails?: A Lot Help needed moving to and from a bed to a chair (including a wheelchair)?: A Little Help needed standing up from a chair using your arms (e.g., wheelchair or bedside chair)?: A Little Help needed to walk in hospital room?: A Lot Help needed climbing 3-5 steps with a railing? : Total 6 Click Score: 15    End of Session Equipment Utilized During Treatment: Gait belt;Oxygen (2 L via nasal cannula)  Activity Tolerance: Patient limited by fatigue;Patient limited by lethargy (pt drowsy during session--nurse notified) Patient left: in chair;with call bell/phone within reach;with chair alarm set;with family/visitor present;Other (comment) (fall mat in place) Nurse Communication: Mobility status;Precautions;Other (comment) (pt's drowsiness) PT Visit Diagnosis: Other abnormalities of gait and mobility (R26.89);Muscle weakness (generalized) (M62.81)    Time: 1123-1206 PT Time Calculation (min) (ACUTE ONLY): 43 min   Charges:   PT Evaluation $PT Eval Low Complexity: 1 Low PT Treatments $Therapeutic Activity: 8-22 mins       Leitha Bleak, PT 07/21/22, 2:38 PM

## 2022-07-21 NOTE — TOC Progression Note (Signed)
Transition of Care [facility]) - Progression Note    Patient Details  Name: MAELY CLEMENTS MRN: [id number] Date of Birth: 02/07/1934  Transition of Care [facility]) CM/SW [place], [place] Phone Number: 07/21/2022, 12:37 PM  Clinical Narrative:     CSW spoke with patient's facility [facility] to inquire if they will allow [facility] sitters to watch patient while at ALF as son had reached out to [facility] for services.   [name] Page reports she will send clinicals to supervisors to see if approved. Patient was recently transitioned to memory care at [facility] prior to admission.  Expected Discharge Plan: Assisted Living Barriers to Discharge: Continued Medical Work up  Expected Discharge Plan and Services       Living arrangements for the past 2 months: [place]                                       Social Determinants of Health (SDOH) Interventions SDOH Screenings   Food Insecurity: No Food Insecurity (07/20/2022)  Housing: Low Risk  (07/20/2022)  Transportation Needs: No Transportation Needs (07/20/2022)  Utilities: Not At Risk (07/20/2022)  Depression (PHQ2-9): Low Risk  (11/24/2021)  Financial Resource Strain: Low Risk  (11/01/2021)  Physical Activity: Inactive (08/25/2020)  Social Connections: Moderately Integrated (11/01/2021)  Stress: No Stress Concern Present (11/01/2021)  Tobacco Use: Low Risk  (07/20/2022)    Readmission Risk Interventions     No data to display

## 2022-07-21 NOTE — TOC Progression Note (Addendum)
Transition of Care [facility]) - Progression Note    Patient Details  Name: Linda [name] MRN: [id number] Date of Birth: [birth year]  Transition of Care [facility]) CM/SW [place], LCSW Phone Number: 07/21/2022, 3:08 PM  Clinical Narrative: PT recommending that patient return to the SNF side at [facility]. Admissions coordinator is aware. Patient is on airborne isolation precautions so CSW left daughter a Advertising account executive. Admissions coordinator said patient tested positive for COVID 2-3 weeks ago so she will not have to quarantine here before she returns.   3:36 pm: Received return call from daughter. Following discussions with therapy, will see how patient does over the weekend before deciding to go to SNF side again or go back to ALF.  Expected Discharge Plan: Assisted Living Barriers to Discharge: Continued Medical Work up  Expected Discharge Plan and Services       Living arrangements for the past 2 months: [place]                                       Social Determinants of Health (SDOH) Interventions SDOH Screenings   Food Insecurity: No Food Insecurity (07/20/2022)  Housing: Low Risk  (07/20/2022)  Transportation Needs: No Transportation Needs (07/20/2022)  Utilities: Not At Risk (07/20/2022)  Depression (PHQ2-9): Low Risk  (11/24/2021)  Financial Resource Strain: Low Risk  (11/01/2021)  Physical Activity: Inactive (08/25/2020)  Social Connections: Moderately Integrated (11/01/2021)  Stress: No Stress Concern Present (11/01/2021)  Tobacco Use: Low Risk  (07/20/2022)    Readmission Risk Interventions     No data to display

## 2022-07-21 NOTE — Evaluation (Signed)
Occupational Therapy Evaluation Patient Details Name: Linda [name] MRN: [id number] DOB: 1934-03-09 Today's Date: 07/21/2022   History of Present Illness Pt is an 89 y.o. female presenting to hospital 07/20/22 with c/o B LE swelling and R leg erythema.  (+) COVID but per pt's daughter that is not a new finding and pt has been COVID + for several weeks.  Pt admitted with acute hypoxic respiratory failure d/t acute diastolic CHF; R lower leg cellulitis.  PMH includes stroke, seizures, HOH, CHF, a-fib on Eliquis, anxiety, UTI, RCR, and post stroke epilepsy.   Clinical Impression   Ms. Watford was seen for OT evaluation this date. Prior to hospital admission, pt was residing at the [facility]. Per dtr at bedside, pt was able to use the bathroom independent and required intermittent assistance from ALF staff for bathing/dressing. Pt presents to acute OT demonstrating impaired ADL performance and functional mobility 2/2 generalized weakness, cardiopulmonary status, decreased activity tolerance (See OT problem list). Pt currently requires SUPERVISION for STS/short functional transfers using a RW as well as MIN A for LB ADL management and SET UP/SUPERVISION for UB ADL management.  Pt would benefit from skilled OT services to address noted impairments and functional limitations (see below for any additional details) in order to maximize safety and independence while minimizing falls risk and caregiver burden. Upon hospital discharge, recommend STR to maximize pt safety and return to PLOF.        Recommendations for follow up therapy are one component of a multi-disciplinary discharge planning process, led by the attending physician.  Recommendations may be updated based on patient status, additional functional criteria and insurance authorization.   Follow Up Recommendations  Skilled nursing-short term rehab (<3 hours/day)     Assistance Recommended at Discharge Frequent or constant  Supervision/Assistance  Patient can return home with the following A little help with walking and/or transfers;A little help with bathing/dressing/bathroom;Assistance with cooking/housework;Assist for transportation    Functional Status Assessment  Patient has had a recent decline in their functional status and demonstrates the ability to make significant improvements in function in a reasonable and predictable amount of time.  Equipment Recommendations  None recommended by OT    Recommendations for Other Services       Precautions / Restrictions Precautions Precautions: Fall Restrictions Weight Bearing Restrictions: No      Mobility Bed Mobility Overal bed mobility: Needs Assistance             General bed mobility comments: deferred. Pt in recliner at start/end of session.    Transfers Overall transfer level: Needs assistance Equipment used: Rolling walker (2 wheels) Transfers: Sit to/from Stand Sit to Stand: Supervision           General transfer comment: Supervision to STS x2 from recliner with RW.      Balance Overall balance assessment: Needs assistance Sitting-balance support: Feet supported, No upper extremity supported Sitting balance-Leahy Scale: Fair Sitting balance - Comments: steady static sitting   Standing balance support: Bilateral upper extremity supported, During functional activity, Reliant on assistive device for balance Standing balance-Leahy Scale: Fair Standing balance comment: able to stand with UE support. No physical assist required.                           ADL either performed or assessed with clinical judgement   ADL Overall ADL's : Needs assistance/impaired  Functional mobility during ADLs: Supervision/safety;Rolling walker (2 wheels) General ADL Comments: Pt able to perform functional mobility with supervision for safety and RW at EOB. She is able to take ~ 5  steps forward and backward. SET UP for UB ADL management while seated. Anticipate MIN A for LB ADL management and toileting.     Vision Baseline Vision/History: 1 Wears glasses Patient Visual Report: No change from baseline       Perception     Praxis      Pertinent Vitals/Pain Pain Assessment Pain Assessment: No/denies pain Faces Pain Scale: No hurt     Hand Dominance Right   Extremity/Trunk Assessment Upper Extremity Assessment Upper Extremity Assessment: Generalized weakness   Lower Extremity Assessment Lower Extremity Assessment: Generalized weakness       Communication Communication Communication: Receptive difficulties;Expressive difficulties;HOH   Cognition Arousal/Alertness: Awake/alert Behavior During Therapy: Flat affect Overall Cognitive Status: Difficult to assess                                 General Comments: Pt more alert this session than with PT earlier in the day. She is able to follow VCs with increased time/cueing but does not verbalize much.     General Comments  VSS t/o session with pt on 2L [place]. SpO2 remains WFL.    Exercises Other Exercises Other Exercises: Pt/dtr educated on role of OT in acute setting, DC recs. Pt educated on safe transfer technique and safe use of AE/DME for ADL management.   Shoulder Instructions      Home Living Family/patient expects to be discharged to:: Assisted living                             Home Equipment: Rolling Walker (2 wheels)   Additional Comments: Pt lives at [facility] ([place]).      Prior Functioning/Environment Prior Level of Function : Needs assist             Mobility Comments: Ambulatory with walker. ADLs Comments: Drives to get her hair and nails does, and also small grocery shopping trips. She prepares her own breakfast and dinner in her apartment and goes to dining room for lunch.        OT Problem List: Decreased strength;Cardiopulmonary  status limiting activity;Decreased coordination;Decreased activity tolerance;Decreased safety awareness;Impaired balance (sitting and/or standing);Decreased knowledge of use of DME or AE      OT Treatment/Interventions: Self-care/ADL training;Therapeutic exercise;Therapeutic activities;DME and/or AE instruction;Patient/family education;Balance training    OT Goals(Current goals can be found in the care plan section) Acute Rehab OT Goals Patient Stated Goal: To feel better OT Goal Formulation: With patient/family Time For Goal Achievement: 08/04/22 Potential to Achieve Goals: Good  OT Frequency: Min 2X/week    Co-evaluation              AM-PAC OT "6 Clicks" Daily Activity     Outcome Measure Help from another person eating meals?: A Little Help from another person taking care of personal grooming?: A Little Help from another person toileting, which includes using toliet, bedpan, or urinal?: A Little Help from another person bathing (including washing, rinsing, drying)?: A Little Help from another person to put on and taking off regular upper body clothing?: A Little Help from another person to put on and taking off regular lower body clothing?: A Little 6 Click Score: 18  End of Session Equipment Utilized During Treatment: Rolling walker (2 wheels);Gait belt Nurse Communication: Mobility status  Activity Tolerance: Patient tolerated treatment well Patient left: in chair;with call bell/phone within reach;with chair alarm set  OT Visit Diagnosis: Other abnormalities of gait and mobility (R26.89);Muscle weakness (generalized) (M62.81)                Time: 8485-9276 OT Time Calculation (min): 23 min Charges:  OT General Charges $OT Visit: 1 Visit OT Evaluation $OT Eval Moderate Complexity: 1 Mod OT Treatments $Self Care/Home Management : 8-22 mins  Shara Blazing, M.S., OTR/L 07/21/22, 4:00 PM

## 2022-07-22 DIAGNOSIS — I509 Heart failure, unspecified: Secondary | ICD-10-CM | POA: Diagnosis not present

## 2022-07-22 LAB — CBC
HCT: 38.5 % (ref 36.0–46.0)
Hemoglobin: 12.4 g/dL (ref 12.0–15.0)
MCH: 31.3 pg (ref 26.0–34.0)
MCHC: 32.2 g/dL (ref 30.0–36.0)
MCV: 97.2 fL (ref 80.0–100.0)
Platelets: 165 10*3/uL (ref 150–400)
RBC: 3.96 MIL/uL (ref 3.87–5.11)
RDW: 15.1 % (ref 11.5–15.5)
WBC: 6.2 10*3/uL (ref 4.0–10.5)
nRBC: 0 % (ref 0.0–0.2)

## 2022-07-22 LAB — COMPREHENSIVE METABOLIC PANEL
ALT: 18 U/L (ref 0–44)
AST: 24 U/L (ref 15–41)
Albumin: 3.1 g/dL — ABNORMAL LOW (ref 3.5–5.0)
Alkaline Phosphatase: 64 U/L (ref 38–126)
Anion gap: 9 (ref 5–15)
BUN: 20 mg/dL (ref 8–23)
CO2: 33 mmol/L — ABNORMAL HIGH (ref 22–32)
Calcium: 8.8 mg/dL — ABNORMAL LOW (ref 8.9–10.3)
Chloride: 96 mmol/L — ABNORMAL LOW (ref 98–111)
Creatinine, Ser: 0.72 mg/dL (ref 0.44–1.00)
GFR, Estimated: >60 mL/min (ref >60)
Glucose, Bld: 136 mg/dL — ABNORMAL HIGH (ref 70–99)
Potassium: 3.1 mmol/L — ABNORMAL LOW (ref 3.5–5.1)
Sodium: 138 mmol/L (ref 135–145)
Total Bilirubin: 1.4 mg/dL — ABNORMAL HIGH (ref 0.3–1.2)
Total Protein: 6.1 g/dL — ABNORMAL LOW (ref 6.5–8.1)

## 2022-07-22 LAB — PHOSPHORUS: Phosphorus: 2.7 mg/dL (ref 2.5–4.6)

## 2022-07-22 LAB — GLUCOSE, CAPILLARY
Glucose-Capillary: 152 mg/dL — ABNORMAL HIGH (ref 70–99)
Glucose-Capillary: 157 mg/dL — ABNORMAL HIGH (ref 70–99)
Glucose-Capillary: 122 mg/dL — ABNORMAL HIGH (ref 70–99)
Glucose-Capillary: 167 mg/dL — ABNORMAL HIGH (ref 70–99)

## 2022-07-22 LAB — URINE CULTURE: Culture: >=100000 — AB

## 2022-07-22 LAB — C-REACTIVE PROTEIN: CRP: 1.7 mg/dL — ABNORMAL HIGH (ref <1.0)

## 2022-07-22 LAB — MAGNESIUM: Magnesium: 1.8 mg/dL (ref 1.7–2.4)

## 2022-07-22 MED ORDER — VANCOMYCIN HCL IN DEXTROSE 1-5 GM/200ML-% IV SOLN
1000.0000 mg | INTRAVENOUS | Status: DC
  Administered 2022-07-23 – 2022-07-24 (×2): 1000 mg via INTRAVENOUS
  Filled 2022-07-22 (×3): qty 200

## 2022-07-22 MED ORDER — FUROSEMIDE 40 MG PO TABS
40.0000 mg | ORAL_TABLET | Freq: Every day | ORAL | Status: DC
  Administered 2022-07-22 – 2022-07-25 (×4): 40 mg via ORAL
  Filled 2022-07-22 (×4): qty 1

## 2022-07-22 MED ORDER — POTASSIUM CHLORIDE 10 MEQ/100ML IV SOLN
10.0000 meq | INTRAVENOUS | Status: AC
  Administered 2022-07-22: 10 meq via INTRAVENOUS
  Filled 2022-07-22 (×2): qty 100

## 2022-07-22 MED ORDER — VANCOMYCIN HCL 1500 MG/300ML IV SOLN
1500.0000 mg | Freq: Once | INTRAVENOUS | Status: AC
  Administered 2022-07-22: 1500 mg via INTRAVENOUS
  Filled 2022-07-22: qty 300

## 2022-07-22 MED ORDER — SODIUM CHLORIDE 0.9 % IV SOLN
2.0000 g | Freq: Two times a day (BID) | INTRAVENOUS | Status: AC
  Administered 2022-07-22 – 2022-07-25 (×7): 2 g via INTRAVENOUS
  Filled 2022-07-22: qty 2
  Filled 2022-07-22: qty 12.5
  Filled 2022-07-22 (×3): qty 2
  Filled 2022-07-22: qty 12.5
  Filled 2022-07-22: qty 2

## 2022-07-22 MED ORDER — POTASSIUM CHLORIDE CRYS ER 20 MEQ PO TBCR
40.0000 meq | EXTENDED_RELEASE_TABLET | Freq: Two times a day (BID) | ORAL | Status: AC
  Administered 2022-07-22 (×2): 40 meq via ORAL
  Filled 2022-07-22 (×2): qty 2

## 2022-07-22 NOTE — Progress Notes (Addendum)
Made Dr. Karmen Bongo aware paitent is unable to tolerate iv potassium. Iv was flushed blood returned noted, attempted to run iv potassium slower however patient continues to c/o of discomfort and unable to tolerated. Md returned paged, stated okay to just give scheduled po Potassium.

## 2022-07-22 NOTE — Consult Note (Addendum)
Pharmacy Antibiotic Note  Linda [name] is a 89 y.o. female admitted on 07/20/2022 with cellulitis. PMH significant for HFrEF, Afib, HLD, HTN, GERD, hx CVA, T2DM, GAD. On presentation, patient has a nonpurulent cellulitis. History of MSSA in wound culture on 08/2021. Patient positive for COVID. Patient initiated on empiric cefepime and vancomycin in ED and de-escalated to cefazolin on 2/2. On 2/3 UCx resulted growing E. faecum and Pseudomonas. Given UCx in the past year growing several different organisms, difficult to determine colonization versus pathogen. Patient has no reported symptoms of UTI. Discussed antibiotic plan with MD, and patient will be resumed on cefepime and vancomycin given cellulitis and UCx results. Pharmacy has been consulted to dose vancomycin and cefepime.  Plan: Day 3 of antibiotics Discontinue cefazolin Give vancomycin 1500 mg IV x1 followed by 1000 mg IV Q24H. Goal AUC 400-550. Expected AUC: 447.8 Expected Css min: 11.6 SCr used: 0.8 (actual 0.72)  Weight used: IBW, Vd used: 0.72 (BMI 29.9) Resume cefepime 2 g IV Q12H Continue to monitor renal function and follow culture results   Height: 5' 3.5" (161.3 cm) Weight: 77.9 kg (171 lb 12.8 oz) (as of 1/1) IBW/kg (Calculated) : 53.55  Temp (24hrs), Avg:98.1 F (36.7 C), Min:97.8 F (36.6 C), Max:98.3 F (36.8 C)  Recent Labs  Lab 07/20/22 0849 07/21/22 0543 07/22/22 0519  WBC 4.8 5.6 6.2  CREATININE 0.62 0.81 0.72  LATICACIDVEN 1.3  --   --      Estimated Creatinine Clearance: 50.4 mL/min (by C-G formula based on SCr of 0.72 mg/dL).    Allergies  Allergen Reactions   Barium Iodide     Unknown   Lipitor [Atorvastatin] Other (See Comments)    Myalgias    Statins Other (See Comments)    Myalgias    Crestor [Rosuvastatin Calcium] Other (See Comments)    Myalgias    Penicillins Rash   Sulfa Antibiotics Rash   Sulfonamide Derivatives Rash    Antimicrobials this admission: 2/2 Cefazolin >> 2/3 2/1  Cefepime >>  2/1 Vancomycin >>   Dose adjustments this admission: N/A  Microbiology results: 2/1 BCx: NG2D 2/1 UCx: >100k E. faecium (R-ampicillin), Pseudomonas aeruginosa (pan sensitive)  Thank you for allowing pharmacy to be a part of this patient's care.  Gretel Acre, PharmD PGY1 Pharmacy Resident 07/22/2022 12:09 PM

## 2022-07-22 NOTE — Progress Notes (Signed)
  Progress Note   Patient: Linda [name] LJQ:[id number] DOB: April 04, 1934 DOA: 07/20/2022     2 DOS: the patient was seen and examined on 07/22/2022   Brief hospital course:  Assessment and Plan:  Acute hypoxic resp failure due to acute diastolic CHF  - ECHO from 02/09/2022 showed LV 55% (LV severely dilated) - ECHO 07/20/2022 LVEF 55-60%, no regional WMA's  - PO lasix 40 mg PO daily  - Strict I and O  - [place] healthy diet with 1.5 L daily fluid restriction  - Cardiac monitoring    2. R lower leg cellulitis  - IV cefepime 2 g bid  - F/u blood cx's from the ER (so far negative)   3. COVID +  - Per family this is not a new finding (pt has been COVID + already)   4. DM  - Novolog ACHS    5. Chronic afib  - Eliquis 5 mg PO bid    6. HTN  - Losartan 25 mg PO daily    7. HLD  - Zetia 10 mg PO daily    8. Post stroke epilepsy  - Keppra 500 mg PO bid (last DC summary advised continue keppra indefinitely)   9. UTI (E.faecium and pseudomonas aeruginosa)  - IV vancomycin 1500 mg (consult per pharmacy)   - IV cefepime 2 g bid    DVT prophylaxis: Eliquis as above  GI Prophylaxis: Protonix 40 mg PO daily      Subjective: Pt seen and examined at the bedside. Pt switched to PO lasix today. Urine cx growing E.faecium and pseudomonas aeruginosa). Pt will be placed back on vancomycin and cefepime per the urine culture and sensitives reported 07/20/2022.   Physical Exam: Vitals:   07/22/22 0852 07/22/22 1027 07/22/22 1030 07/22/22 1100  BP: 131/61     Pulse: 84     Resp:    19  Temp:      TempSrc:      SpO2: 98% (!) 80% 95%   Weight:      Height:       Constitutional:      Appearance: Normal appearance.  HENT:     Head: Normocephalic and atraumatic.     Mouth/Throat:     Mouth: Mucous membranes are moist.  Cardiovascular:     Rate and Rhythm: Normal rate and regular rhythm.  Pulmonary:     Breath sounds: Improving aeration b/l  Abdominal:     General: Abdomen is flat.      Palpations: Abdomen is soft.  Musculoskeletal:        General:  Trace edema b/l legs     Cervical back: Neck supple.  Skin:    General: Skin is warm.     Comments: R lower leg erythema improved  Neurological:     Mental Status: She is alert. She is disoriented.  Psychiatric:        Mood and Affect: Mood normal.  Data Reviewed:   Disposition: Status is: Inpatient  Planned Discharge Destination: Skilled nursing facility    Time spent: 35 minutes  Author: Lucienne Minks , MD 07/22/2022 11:37 AM  For on call review [email or web address].

## 2022-07-22 NOTE — Progress Notes (Signed)
Physical Therapy Treatment Patient Details Name: Linda [name] MRN: [id number] DOB: 10/06/1933 Today's Date: 07/22/2022   History of Present Illness Pt is an 89 y.o. female presenting to hospital 07/20/22 with c/o B LE swelling and R leg erythema.  (+) COVID but per pt's daughter that is not a new finding and pt has been COVID + for several weeks.  Pt admitted with acute hypoxic respiratory failure d/t acute diastolic CHF; R lower leg cellulitis.  PMH includes stroke, seizures, HOH, CHF, a-fib on Eliquis, anxiety, UTI, RCR, and post stroke epilepsy.    PT Comments    Pt was long sitting in bed with RN in room upon arriving. Pt just placed on 2L o2 prior to arriving due to desaturation. She was able to maintain sao2 > 92% on the 2 L o2. Overall pt demonstrated much improved mobility from previous PT session. She does present with expressive asphasia and is HOH, however followed commands consistently throughout. Increased time for all mobility, transfers, and gait. Pt tolerated ambulation around room ~ 40 ft with RW prior to requesting to sit due to being cold. Vitals were stable throughout. Pt was not visibly fatigued. Author called pt's daughter to discuss improvements. Family are interested/ hopeful pt can return to her ALF and have [facility] services (PT/OT/SLP). She will benefit from continued skilled PT at DC to maximize her independence while returning to PLOF.    Recommendations for follow up therapy are one component of a multi-disciplinary discharge planning process, led by the attending physician.  Recommendations may be updated based on patient status, additional functional criteria and insurance authorization.  Follow Up Recommendations  Home health PT (Author recomemnds pt DC to her home ([facility] [place] [facility]) with [facility] services.If unable to return to ALF, recommend STR to maximize her independence while asisting her to PLOF.)     Assistance Recommended at Discharge Intermittent  Supervision/Assistance  Patient can return home with the following A little help with walking and/or transfers;A little help with bathing/dressing/bathroom;Assistance with cooking/housework;Direct supervision/assist for medications management;Direct supervision/assist for financial management;Assist for transportation;Help with stairs or ramp for entrance   Equipment Recommendations  Rolling walker (2 wheels);BSC/3in1;Wheelchair cushion (measurements PT);Wheelchair (measurements PT) (w/c for longer community distances)       Precautions / Restrictions Precautions Precautions: Fall Restrictions Weight Bearing Restrictions: No     Mobility  Bed Mobility Overal bed mobility: Needs Assistance Bed Mobility: Supine to Sit  Supine to sit: Min guard, Supervision  General bed mobility comments: Pt was able to exit bed with increased time + supervision. Occasional CGA for cueing/ improved sequencing. HOB was elevated.    Transfers Overall transfer level: Needs assistance Equipment used: Rolling walker (2 wheels) Transfers: Sit to/from Stand Sit to Stand: Supervision    General transfer comment: no physical assistance required to stand from EOB or recliner surfaces    Ambulation/Gait Ambulation/Gait assistance: Min guard, Supervision Gait Distance (Feet): 40 Feet Assistive device: Rolling walker (2 wheels) Gait Pattern/deviations: Step-through pattern Gait velocity: decreased  General Gait Details: Pt ambulated 1 x 40 ft in room on 2 L o2. sao2 > 92 % on O2. Distance was limited by pt wanting to get under blankets from being cold. No LOB with use of RW. slow cautious steady gait observed.    Balance Overall balance assessment: Needs assistance Sitting-balance support: Feet supported, No upper extremity supported Sitting balance-Leahy Scale: Good Sitting balance - Comments: no LOB while sitting EOB and while sitting in recliner   Standing  balance support: Bilateral upper extremity  supported, During functional activity, Reliant on assistive device for balance Standing balance-Leahy Scale: Fair Standing balance comment: able to stand with UE support. No physical assist required.     Cognition Arousal/Alertness: Awake/alert Behavior During Therapy: WFL for tasks assessed/performed Overall Cognitive Status: No family/caregiver present to determine baseline cognitive functioning    General Comments: Pt is alert but does present with some expressive aphasia. she endorses being cold however was cooperative and able to follow commands consistently throughout. Pt is HOH but when able to hear what task is desired of her, she performed well. RN placed pt on 2L o2 prior to session due to desaturation               Pertinent Vitals/Pain Pain Assessment Pain Assessment: No/denies pain     PT Goals (current goals can now be found in the care plan section) Acute Rehab PT Goals Patient Stated Goal: none stated Progress towards PT goals: Progressing toward goals    Frequency    Min 2X/week      PT Plan Current plan remains appropriate       AM-PAC PT "6 Clicks" Mobility   Outcome Measure  Help needed turning from your back to your side while in a flat bed without using bedrails?: None Help needed moving from lying on your back to sitting on the side of a flat bed without using bedrails?: A Little Help needed moving to and from a bed to a chair (including a wheelchair)?: A Little Help needed standing up from a chair using your arms (e.g., wheelchair or bedside chair)?: A Little Help needed to walk in hospital room?: A Little Help needed climbing 3-5 steps with a railing? : A Lot 6 Click Score: 18    End of Session Equipment Utilized During Treatment: Oxygen Activity Tolerance: Patient tolerated treatment well Patient left: in chair;with call bell/phone within reach;with chair alarm set;with family/visitor present;Other (comment) Nurse Communication: Mobility  status PT Visit Diagnosis: Other abnormalities of gait and mobility (R26.89);Muscle weakness (generalized) (M62.81)     Time: 1610-9604 PT Time Calculation (min) (ACUTE ONLY): 26 min  Charges:  $Gait Training: 8-22 mins $Therapeutic Activity: 8-22 mins                     Julaine Fusi PTA 07/22/22, 10:18 AM

## 2022-07-22 NOTE — Plan of Care (Signed)
Patient remained stable overnight. Continue IV abx and IV push lasix. Foley in place and patient had adequate UOP overnight. Intermittent O2 Overnight, remains in Afib,    Problem: Education: Goal: Knowledge of General Education information will improve Description: Including pain rating scale, medication(s)/side effects and non-pharmacologic comfort measures Outcome: Progressing   Problem: Health Behavior/Discharge Planning: Goal: Ability to manage health-related needs will improve Outcome: Progressing   Problem: Clinical Measurements: Goal: Ability to maintain clinical measurements within normal limits will improve Outcome: Progressing Goal: Will remain free from infection Outcome: Progressing Goal: Diagnostic test results will improve Outcome: Progressing Goal: Respiratory complications will improve Outcome: Progressing Goal: Cardiovascular complication will be avoided Outcome: Progressing   Problem: Activity: Goal: Risk for activity intolerance will decrease Outcome: Progressing   Problem: Nutrition: Goal: Adequate nutrition will be maintained Outcome: Progressing   Problem: Coping: Goal: Level of anxiety will decrease Outcome: Progressing   Problem: Elimination: Goal: Will not experience complications related to bowel motility Outcome: Progressing Goal: Will not experience complications related to urinary retention Outcome: Progressing   Problem: Pain Managment: Goal: General experience of comfort will improve Outcome: Progressing   Problem: Safety: Goal: Ability to remain free from injury will improve Outcome: Progressing   Problem: Skin Integrity: Goal: Risk for impaired skin integrity will decrease Outcome: Progressing   Problem: Education: Goal: Ability to describe self-care measures that may prevent or decrease complications (Diabetes Survival Skills Education) will improve Outcome: Progressing Goal: Individualized Educational Video(s) Outcome:  Progressing   Problem: Coping: Goal: Ability to adjust to condition or change in health will improve Outcome: Progressing   Problem: Fluid Volume: Goal: Ability to maintain a balanced intake and output will improve Outcome: Progressing   Problem: Health Behavior/Discharge Planning: Goal: Ability to identify and utilize available resources and services will improve Outcome: Progressing Goal: Ability to manage health-related needs will improve Outcome: Progressing   Problem: Metabolic: Goal: Ability to maintain appropriate glucose levels will improve Outcome: Progressing   Problem: Nutritional: Goal: Maintenance of adequate nutrition will improve Outcome: Progressing Goal: Progress toward achieving an optimal weight will improve Outcome: Progressing   Problem: Skin Integrity: Goal: Risk for impaired skin integrity will decrease Outcome: Progressing   Problem: Tissue Perfusion: Goal: Adequacy of tissue perfusion will improve Outcome: Progressing

## 2022-07-23 DIAGNOSIS — I5033 Acute on chronic diastolic (congestive) heart failure: Secondary | ICD-10-CM

## 2022-07-23 LAB — COMPREHENSIVE METABOLIC PANEL
ALT: 17 U/L (ref 0–44)
AST: 23 U/L (ref 15–41)
Albumin: 3.1 g/dL — ABNORMAL LOW (ref 3.5–5.0)
Alkaline Phosphatase: 65 U/L (ref 38–126)
Anion gap: 6 (ref 5–15)
BUN: 20 mg/dL (ref 8–23)
CO2: 33 mmol/L — ABNORMAL HIGH (ref 22–32)
Calcium: 8.4 mg/dL — ABNORMAL LOW (ref 8.9–10.3)
Chloride: 100 mmol/L (ref 98–111)
Creatinine, Ser: 0.58 mg/dL (ref 0.44–1.00)
GFR, Estimated: >60 mL/min (ref >60)
Glucose, Bld: 124 mg/dL — ABNORMAL HIGH (ref 70–99)
Potassium: 3.6 mmol/L (ref 3.5–5.1)
Sodium: 139 mmol/L (ref 135–145)
Total Bilirubin: 1.3 mg/dL — ABNORMAL HIGH (ref 0.3–1.2)
Total Protein: 6 g/dL — ABNORMAL LOW (ref 6.5–8.1)

## 2022-07-23 LAB — CBC
HCT: 37.1 % (ref 36.0–46.0)
Hemoglobin: 12 g/dL (ref 12.0–15.0)
MCH: 31.7 pg (ref 26.0–34.0)
MCHC: 32.3 g/dL (ref 30.0–36.0)
MCV: 97.9 fL (ref 80.0–100.0)
Platelets: 160 10*3/uL (ref 150–400)
RBC: 3.79 MIL/uL — ABNORMAL LOW (ref 3.87–5.11)
RDW: 15.2 % (ref 11.5–15.5)
WBC: 6 10*3/uL (ref 4.0–10.5)
nRBC: 0 % (ref 0.0–0.2)

## 2022-07-23 LAB — GLUCOSE, CAPILLARY
Glucose-Capillary: 125 mg/dL — ABNORMAL HIGH (ref 70–99)
Glucose-Capillary: 213 mg/dL — ABNORMAL HIGH (ref 70–99)
Glucose-Capillary: 109 mg/dL — ABNORMAL HIGH (ref 70–99)
Glucose-Capillary: 204 mg/dL — ABNORMAL HIGH (ref 70–99)

## 2022-07-23 LAB — MAGNESIUM: Magnesium: 1.7 mg/dL (ref 1.7–2.4)

## 2022-07-23 LAB — PHOSPHORUS: Phosphorus: 1.6 mg/dL — ABNORMAL LOW (ref 2.5–4.6)

## 2022-07-23 LAB — C-REACTIVE PROTEIN: CRP: 1.1 mg/dL — ABNORMAL HIGH (ref <1.0)

## 2022-07-23 NOTE — Progress Notes (Signed)
SATURATION QUALIFICATIONS: (This note is used to comply with regulatory documentation for home oxygen)  Patient Saturations on Room Air at Rest = 87%  Patient Saturation on 1 Liters of oxygen at rest 93%  Patient Saturations on 1 Liters of oxygen while Ambulating = 88%  Patient Sautration on 2 Liters of oxygen while ambulating =  94%  Please briefly explain why patient needs home oxygen:

## 2022-07-23 NOTE — Progress Notes (Signed)
MD discontinued foley.  Catheter pulled around 1220.  Will continue to monitor urination.  Bladder scan in 6 hours if patient has not voided before then.

## 2022-07-23 NOTE — Progress Notes (Signed)
Progress Note   Patient: Linda [name] EHO:[id number] DOB: 1934-04-21 DOA: 07/20/2022     3 DOS: the patient was seen and examined on 07/23/2022   Brief hospital course: Linda [name] is a 89 y.o. female who presents to the hospital from her memory facility ([facility]) with lower extremity edema.    Upon arrival to the ER the pt was noted to require 2L [place]. CXR showed findings concerning for pulmonary edema.  She was also found to have elevated proBNP.  She was also tested positive for COVID, per daughter she has been positive for several weeks.  2/4: Hemodynamically stable, remained on 2 L of oxygen with no baseline oxygen use.  Patient tested positive for COVID on 06/28/2022.  Ordered removal of Foley catheter to give her a voiding trial.  Try weaning from oxygen. Discontinuing isolation.  Assessment and Plan:  Acute hypoxic resp failure due to acute diastolic CHF  - ECHO from 02/09/2022 showed LV 55% (LV severely dilated) - ECHO 07/20/2022 LVEF 55-60%, no regional WMA's  - PO lasix 40 mg PO daily  - Strict I and O -remove Foley catheter - [place] healthy diet with 1.5 L daily fluid restriction  - Cardiac monitoring    2. R lower leg cellulitis  - IV cefepime 2 g bid  - F/u blood cx's from the ER (so far negative)   3. COVID +  - Per family this is not a new finding (pt has been COVID + already) -Discontinue precautions   4. DM  - Novolog ACHS    5. Chronic afib  - Eliquis 5 mg PO bid    6. HTN  - Losartan 25 mg PO daily    7. HLD  - Zetia 10 mg PO daily    8. Post stroke epilepsy  - Keppra 500 mg PO bid (last DC summary advised continue keppra indefinitely)   9. UTI (E.faecium and pseudomonas aeruginosa)  - IV vancomycin 1500 mg (consult per pharmacy)   - IV cefepime 2 g bid    DVT prophylaxis: Eliquis as above  GI Prophylaxis: Protonix 40 mg PO daily   Subjective: Patient was resting comfortably in chair.  No new complaints.  Daughter at bedside.  No  baseline oxygen use.  No prior Foley catheter  Physical Exam: Vitals:   07/22/22 2204 07/22/22 2326 07/23/22 0419 07/23/22 0814  BP: (!) 127/56 (!) 133/98 (!) 158/109 (!) 162/109  Pulse: 69 85 80 89  Resp: '16 17 16 '$ (!) 26  Temp: 98.1 F (36.7 C) 97.9 F (36.6 C) 98.3 F (36.8 C) 97.9 F (36.6 C)  TempSrc:      SpO2: 95% 92% 92% 96%  Weight:      Height:       General.  Frail elderly lady, in no acute distress. Pulmonary.  Lungs clear bilaterally, normal respiratory effort. CV.  Regular rate and rhythm, no JVD, rub or murmur. Abdomen.  Soft, nontender, nondistended, BS positive. CNS.  Alert and oriented .  No focal neurologic deficit. Extremities.  No edema, no cyanosis, pulses intact and symmetrical. Psychiatry.  Judgment and insight appears impaired  Data Reviewed: Prior data reviewed  Disposition: Status is: Inpatient  Planned Discharge Destination: Skilled nursing facility  DVT prophylaxis.  Eliquis Time spent: 40 minutes  This record has been created using Systems analyst. Errors have been sought and corrected,but may not always be located. Such creation errors do not reflect on the standard of care.  Author: Lorella Nimrod, MD 07/23/2022 11:17 AM  For on call review [email or web address].

## 2022-07-23 NOTE — Progress Notes (Addendum)
Walking ambulation performed.  Patient's saturation dropped to 88% on RA while at rest.  Tried on 1L however O2 dropped to 88-89% with ambulation in hallway.   Placed patient back on 2L with ambulation.  O2 improved to 93-94%.     Patient tolerated ok.  After about 14 - 18 feet, patient's knees started to buckle.  Gait belt was used to help with minimal support when knees buckled.  NT walking behind with chair.  Patient allowed to sit back down in recliner and wheeled back to room.

## 2022-07-24 DIAGNOSIS — I5033 Acute on chronic diastolic (congestive) heart failure: Secondary | ICD-10-CM | POA: Diagnosis not present

## 2022-07-24 LAB — GLUCOSE, CAPILLARY
Glucose-Capillary: 146 mg/dL — ABNORMAL HIGH (ref 70–99)
Glucose-Capillary: 97 mg/dL (ref 70–99)
Glucose-Capillary: 136 mg/dL — ABNORMAL HIGH (ref 70–99)
Glucose-Capillary: 174 mg/dL — ABNORMAL HIGH (ref 70–99)

## 2022-07-24 NOTE — Progress Notes (Signed)
Progress Note   Patient: Linda [name] [id number]:[id number] DOB: [birth year] DOA: 07/20/2022     4 DOS: the patient was seen and examined on 07/24/2022   Brief hospital course: Linda [name] is a 89 y.o. female who presents to the hospital from her memory facility ([facility]) with lower extremity edema.    Upon arrival to the ER the pt was noted to require 2L [place]. CXR showed findings concerning for pulmonary edema.  She was also found to have elevated proBNP.  She was also tested positive for COVID, per daughter she has been positive for several weeks.  2/4: Hemodynamically stable, remained on 2 L of oxygen with no baseline oxygen use.  Patient tested positive for COVID on 06/28/2022.  Ordered removal of Foley catheter to give her a voiding trial.  Try weaning from oxygen. Discontinuing isolation.  2/5: Patient remained stable.  Continue to require 2 L of oxygen and will be discharged on 2 L.  Able to void but having postvoid urinary retention.  No abdominal discomfort.  We will continue encouraging voiding, if developed some discomfort then might need to replace Foley catheter with outpatient urology follow-up. Otherwise stable for discharge-need to go to SNF before going back to ALF.  TOC is aware  Assessment and Plan:  Acute hypoxic resp failure due to acute diastolic CHF  - ECHO from 02/09/2022 showed LV 55% (LV severely dilated) - ECHO 07/20/2022 LVEF 55-60%, no regional WMA's  - PO lasix 40 mg PO daily  - Strict I and O -remove Foley catheter - [place] healthy diet with 1.5 L daily fluid restriction  - Cardiac monitoring    2. R lower leg cellulitis  - IV cefepime 2 g bid -will complete the course of antibiotic today. - F/u blood cx's from the ER (so far negative)   3. COVID +  - Per family this is not a new finding (pt has been COVID + already) -Discontinue precautions   4. DM  - Novolog ACHS    5. Chronic afib  - Eliquis 5 mg PO bid    6. HTN  - Losartan 25 mg PO daily     7. HLD  - Zetia 10 mg PO daily    8. Post stroke epilepsy  - Keppra 500 mg PO bid (last DC summary advised continue keppra indefinitely)   9. UTI (E.faecium and pseudomonas aeruginosa)  - IV vancomycin 1500 mg (consult per pharmacy)   - IV cefepime 2 g bid - -will complete the course of antibiotic today   DVT prophylaxis: Eliquis as above  GI Prophylaxis: Protonix 40 mg PO daily   Subjective: Patient was sitting in chair comfortably when seen today.  No new complaints.  Denies any abdominal pain.  Physical Exam: Vitals:   07/24/22 0324 07/24/22 0736 07/24/22 1034 07/24/22 1220  BP: (!) 142/91 (!) 154/66  (!) 138/94  Pulse: 80 89  61  Resp: 14 (!) 22  18  Temp: 97.8 F (36.6 C) 97.9 F (36.6 C)  98.3 F (36.8 C)  TempSrc:  Oral  Oral  SpO2: 94% 95%  99%  Weight:   77.1 kg   Height:       General.  Frail elderly lady, in no acute distress. Pulmonary.  Lungs clear bilaterally, normal respiratory effort. CV.  Regular rate and rhythm, no JVD, rub or murmur. Abdomen.  Soft, nontender, nondistended, BS positive. CNS.  Alert and oriented .  No focal neurologic deficit. Extremities.  No  edema, no cyanosis, pulses intact and symmetrical. Psychiatry.  Judgment and insight appears impaired.  Data Reviewed: Prior data reviewed  Disposition: Status is: Inpatient  Planned Discharge Destination: Skilled nursing facility  DVT prophylaxis.  Eliquis Time spent: 39 minutes  This record has been created using Systems analyst. Errors have been sought and corrected,but may not always be located. Such creation errors do not reflect on the standard of care.   Author: Lorella Nimrod, MD 07/24/2022 2:38 PM  For on call review [email or web address].

## 2022-07-24 NOTE — Plan of Care (Signed)

## 2022-07-24 NOTE — TOC Progression Note (Addendum)
Transition of Care [facility]) - Progression Note    Patient Details  Name: Linda [name] MRN: [id number] Date of Birth: Apr 07, 1934  Transition of Care [facility]) CM/SW [place], RN Phone Number: 07/24/2022, 1:54 PM  Clinical Narrative:    Spoke with Leonette Nutting from [place]. Per facility DON patient current LOC of higher that what can be facilitated. Patient will require SNF prior to returning to [facility]. MD notified.   Spoke with Leonette Nutting at [name]  to advised of discharge for tomorrow to SNF.   Attempt to contact patient's daughter, Collene Mares [phone]. No answer. Left  a message.    Expected Discharge Plan: Assisted Living Barriers to Discharge: Continued Medical Work up  Expected Discharge Plan and Services       Living arrangements for the past 2 months: [place]                                       Social Determinants of Health (SDOH) Interventions SDOH Screenings   Food Insecurity: No Food Insecurity (07/20/2022)  Housing: Low Risk  (07/20/2022)  Transportation Needs: No Transportation Needs (07/20/2022)  Utilities: Not At Risk (07/20/2022)  Depression (PHQ2-9): Low Risk  (11/24/2021)  Financial Resource Strain: Low Risk  (11/01/2021)  Physical Activity: Inactive (08/25/2020)  Social Connections: Moderately Integrated (11/01/2021)  Stress: No Stress Concern Present (11/01/2021)  Tobacco Use: Low Risk  (07/20/2022)    Readmission Risk Interventions     No data to display

## 2022-07-24 NOTE — Progress Notes (Signed)
PT Cancellation Note  Patient Details Name: Linda [name] MRN: [id number] DOB: January 09, 1934   Cancelled Treatment:    Reason Eval/Treat Not Completed: Other (comment).  Pt resting in bed upon PT arrival.  Pt's daughter reporting pt had just walked to bathroom with assist and gotten back to bed; also reporting pt uncomfortable d/t difficulty urinating (nurse notified).  Pt initially appearing agreeable to walking but then pt appearing wanting to take a nap.  Per discussion with pt and pt's daughter, will let pt rest at this time and re-attempt PT session at a later date/time as able.  Leitha Bleak, PT 07/24/22, 2:33 PM

## 2022-07-24 NOTE — Consult Note (Signed)
Pharmacy Antibiotic Note  Linda [name] is a 89 y.o. female admitted on 07/20/2022 with cellulitis. PMH significant for HFrEF, Afib, HLD, HTN, GERD, hx CVA, T2DM, GAD. On presentation, patient has a nonpurulent cellulitis. History of MSSA in wound culture on 08/2021. Patient positive for COVID. Patient initiated on empiric cefepime and vancomycin in ED and de-escalated to cefazolin on 2/2. On 2/3 UCx resulted growing E. faecum and Pseudomonas. Given UCx in the past year growing several different organisms, difficult to determine colonization versus pathogen. Patient has no reported symptoms of UTI. Discussed antibiotic plan with MD, and patient will be resumed on cefepime and vancomycin given cellulitis and UCx results. Pharmacy has been consulted to dose vancomycin and cefepime.  Plan: Day 5 of antibiotics Continue 1000 mg IV Q24H. Goal AUC 400-550. Expected AUC: 447.8 Expected Css min: 11.6 SCr used: 0.8 (actual 0.72)  Weight used: IBW, Vd used: 0.72 (BMI 29.9) Continue cefepime 2 g IV Q12H Continue to monitor renal function and follow culture results  Message sent to MD to add end-date to current therapy  Height: 5' 3.5" (161.3 cm) Weight: 77.9 kg (171 lb 12.8 oz) (as of 1/1) IBW/kg (Calculated) : 53.55  Temp (24hrs), Avg:97.8 F (36.6 C), Min:97.5 F (36.4 C), Max:98 F (36.7 C)  Recent Labs  Lab 07/20/22 0849 07/21/22 0543 07/22/22 0519 07/23/22 0514  WBC 4.8 5.6 6.2 6.0  CREATININE 0.62 0.81 0.72 0.58  LATICACIDVEN 1.3  --   --   --      Estimated Creatinine Clearance: 50.4 mL/min (by C-G formula based on SCr of 0.58 mg/dL).    Allergies  Allergen Reactions   Barium Iodide     Unknown   Lipitor [Atorvastatin] Other (See Comments)    Myalgias    Statins Other (See Comments)    Myalgias    Crestor [Rosuvastatin Calcium] Other (See Comments)    Myalgias    Penicillins Rash   Sulfa Antibiotics Rash   Sulfonamide Derivatives Rash    Antimicrobials this  admission: 2/2 Cefazolin >> 2/3 2/1 Cefepime >>  2/1 Vancomycin >>   Dose adjustments this admission: N/A  Microbiology results: 2/1 BCx: NG4D 2/1 UCx: >100k E. faecium (R-ampicillin), Pseudomonas aeruginosa (pan sensitive)  Thank you for allowing pharmacy to be a part of this patient's care.  Trevonte Ashkar Rodriguez-Guzman PharmD, BCPS 07/24/2022 9:59 AM

## 2022-07-24 NOTE — Care Management Important Message (Signed)
Important Message  Patient Details  Name: Linda [name] MRN: [id number] Date of Birth: Jan 06, 1934   Medicare Important Message Given:  Yes     Dannette Barbara 07/24/2022, 3:42 PM

## 2022-07-25 DIAGNOSIS — L03115 Cellulitis of right lower limb: Secondary | ICD-10-CM

## 2022-07-25 DIAGNOSIS — I5033 Acute on chronic diastolic (congestive) heart failure: Secondary | ICD-10-CM | POA: Diagnosis not present

## 2022-07-25 DIAGNOSIS — R600 Localized edema: Secondary | ICD-10-CM

## 2022-07-25 LAB — CULTURE, BLOOD (ROUTINE X 2)
Culture: NO GROWTH
Culture: NO GROWTH
Special Requests: ADEQUATE
Special Requests: ADEQUATE

## 2022-07-25 LAB — GLUCOSE, CAPILLARY
Glucose-Capillary: 172 mg/dL — ABNORMAL HIGH (ref 70–99)
Glucose-Capillary: 128 mg/dL — ABNORMAL HIGH (ref 70–99)

## 2022-07-25 MED ORDER — FUROSEMIDE 20 MG PO TABS: 40.0000 mg | ORAL_TABLET | Freq: Every day | ORAL | Status: DC

## 2022-07-25 MED ORDER — BACITRACIN-POLYMYXIN B 500-10000 UNIT/GM OP OINT: TOPICAL_OINTMENT | Freq: Four times a day (QID) | OPHTHALMIC | 0 refills | Status: AC

## 2022-07-25 NOTE — Consult Note (Addendum)
   [facility] CM Inpatient Consult   07/25/2022  [place] [birth year] [id number]  [place]  Accountable Care Organization [ACO] Patient: Medicare ACO REACH  Primary Care Provider:  Kirk Ruths, MD at [facility]   Patient was reviewed for less than 30 days readmission extreme high risk score for barriers to care  If the patient goes to a [facility] affiliated facility then, patient can be followed by [facility] Management PAC RN with traditional Medicare and approved Medicare Advantage plans.    Plan:   Notify [facility] RN who can follow for any known or needs for transitional care needs for returning to post facility care coordination needs to return to community. Patient noted to have transitioned to [facility] will notify [facility] RN with plans to return to [facility] [place] per inpatient [facility] LCSW notes.  For questions or referrals, please contact:   Natividad Brood, RN BSN [place]  [phone] business mobile phone Toll free office [phone]  *[place]  [phone] Fax number: [phone] [email or web address]

## 2022-07-25 NOTE — Plan of Care (Signed)

## 2022-07-25 NOTE — Discharge Summary (Signed)
Physician Discharge Summary   Patient: Linda [name] MRN: [id number] DOB: 22-Jan-1934  Admit date:     07/20/2022  Discharge date: 07/25/22  Discharge Physician: Lorella Nimrod   PCP: Kirk Ruths, MD   Recommendations at discharge:  Please do daily weight and postvoid bladder scan In case of urinary retention more than 500 cc-please do in and out catheter Follow-up with urology for further recommendations about urinary retention Continue using 2 L of oxygen all the time. Please obtain CBC and BMP in 1 week Follow-up with primary care provider  Discharge Diagnoses: Principal Problem:   CHF (congestive heart failure) ([place]) Active Problems:   Bilateral lower extremity edema   Cellulitis of right lower extremity   Hospital Course: Linda [name] is a 89 y.o. female who presents to the hospital from her memory facility ([facility]) with lower extremity edema.     Upon arrival to the ER the pt was noted to require 2L [place]. CXR showed findings concerning for pulmonary edema.  She was also found to have elevated proBNP.  She was also tested positive for COVID, per daughter she has been positive for several weeks.   2/4: Hemodynamically stable, remained on 2 L of oxygen with no baseline oxygen use.  Patient tested positive for COVID on 06/28/2022.  Ordered removal of Foley catheter to give her a voiding trial.  Try weaning from oxygen. Discontinuing isolation.   2/5: Patient remained stable.  Continue to require 2 L of oxygen and will be discharged on 2 L.  Able to void but having postvoid urinary retention.  No abdominal discomfort.  We will continue encouraging voiding, if developed some discomfort then might need to replace Foley catheter with outpatient urology follow-up. Otherwise stable for discharge-need to go to SNF before going back to ALF.   2/6: Patient remained stable.  She continued to have some urinary retention and required in and out catheter once overnight.   Discussed with son and daughter and they decided to do in and out catheter as needed at facility and a close follow-up with urology for further recommendations. Foley catheter was removed 2 days ago.  Not sure whether she has this issue chronically as there was no documented postvoid urinary retention.  Patient completed a course of antibiotics for concern of UTI and right lower extremity cellulitis.  Patient will continue on 2 L of oxygen all the time at her facility. Patient will need daily weight and postvoid bladder scan and as needed in and out catheter.  Please encourage voiding first.  She will continue rest of her current medications and need to have a close follow-up with her providers for further recommendations.  Assessment and Plan: Acute hypoxic resp failure due to acute diastolic CHF  - ECHO from 02/09/2022 showed LV 55% (LV severely dilated) - ECHO 07/20/2022 LVEF 55-60%, no regional WMA's  - PO lasix 40 mg PO daily  - Strict I and O -remove Foley catheter - [place] healthy diet with 1.5 L daily fluid restriction  - Cardiac monitoring    2. R lower leg cellulitis  - IV cefepime 2 g bid -will complete the course of antibiotic today. - F/u blood cx's from the ER (so far negative)   3. COVID +  - Per family this is not a new finding (pt has been COVID + already) -Discontinue precautions   4. DM  - Novolog ACHS    5. Chronic afib  - Eliquis 5 mg PO bid  6. HTN  - Losartan 25 mg PO daily    7. HLD  - Zetia 10 mg PO daily    8. Post stroke epilepsy  - Keppra 500 mg PO bid (last DC summary advised continue keppra indefinitely)    9. UTI (E.faecium and pseudomonas aeruginosa)  - IV vancomycin 1500 mg (consult per pharmacy)   - IV cefepime 2 g bid - -completed the course of antibiotic.  Consultants: None Procedures performed: None Disposition: Skilled nursing facility Diet recommendation:  Discharge Diet Orders (From admission, onward)     Start     Ordered    07/25/22 0000  Diet - low sodium heart healthy        07/25/22 1059           Cardiac and Carb modified diet DISCHARGE MEDICATION: Allergies as of 07/25/2022       Reactions   Barium Iodide    Unknown   Lipitor [atorvastatin] Other (See Comments)   Myalgias   Statins Other (See Comments)   Myalgias   Crestor [rosuvastatin Calcium] Other (See Comments)   Myalgias   Penicillins Rash   Sulfa Antibiotics Rash   Sulfonamide Derivatives Rash        Medication List     STOP taking these medications    insulin aspart 100 UNIT/ML injection Commonly known as: novoLOG       TAKE these medications    acetaminophen 325 MG tablet Commonly known as: TYLENOL Take 2 tablets (650 mg total) by mouth every 4 (four) hours as needed for mild pain (or temp > 37.5 C (99.5 F)).   alum & mag hydroxide-simeth [phone] MG/5ML suspension Commonly known as: MAALOX PLUS Take 30 mLs by mouth every 4 (four) hours as needed for indigestion.   apixaban 5 MG Tabs tablet Commonly known as: ELIQUIS Take 1 tablet (5 mg total) by mouth 2 (two) times daily.   B-complex with vitamin C tablet Take 1 tablet by mouth daily.   bacitracin-polymyxin b ophthalmic ointment Commonly known as: POLYSPORIN Place into both eyes 4 (four) times daily. apply to eye every 12 hours while awake, for 2 more days   docusate sodium 100 MG capsule Commonly known as: COLACE Take 1 capsule (100 mg total) by mouth 2 (two) times daily.   ezetimibe 10 MG tablet Commonly known as: ZETIA Take 1 tablet (10 mg total) by mouth daily.   FLUoxetine 20 MG capsule Commonly known as: PROZAC Take 1 capsule (20 mg total) by mouth at bedtime. What changed: when to take this   furosemide 20 MG tablet Commonly known as: LASIX Take 2 tablets (40 mg total) by mouth daily. What changed: how much to take   levETIRAcetam 500 MG tablet Commonly known as: KEPPRA Take 1 tablet (500 mg total) by mouth 2 (two) times daily.    loratadine 10 MG tablet Commonly known as: CLARITIN Take 1 tablet (10 mg total) by mouth daily.   losartan 25 MG tablet Commonly known as: Cozaar Take 1 tablet (25 mg total) by mouth daily.   magnesium gluconate 500 MG tablet Commonly known as: MAGONATE Take 0.5 tablets (250 mg total) by mouth at bedtime.   melatonin 3 MG Tabs tablet Take 1 tablet (3 mg total) by mouth at bedtime.   metFORMIN 500 MG tablet Commonly known as: GLUCOPHAGE Take 1 tablet (500 mg total) by mouth 2 (two) times daily with a meal.   pantoprazole 40 MG tablet Commonly known as: PROTONIX Take 1 tablet (  40 mg total) by mouth daily at 12 noon. What changed: when to take this   potassium chloride 20 MEQ packet Commonly known as: KLOR-CON Take 20 mEq by mouth daily.   vitamin D3 25 MCG tablet Commonly known as: CHOLECALCIFEROL Take 1 tablet (1,000 Units total) by mouth daily.        Follow-up Information     [facility]. Schedule an appointment as soon as possible for a visit.   Specialty: Urology Why: For urinary retention Contact information: [address] [place] [phone]        Kirk Ruths, MD. Schedule an appointment as soon as possible for a visit in 1 week(s).   Specialty: Internal Medicine Contact information: [place] [address] [phone]                Discharge Exam: Danley Danker Weights   07/20/22 0846 07/24/22 1034  Weight: 77.9 kg 77.1 kg   General.  Frail elderly lady, in no acute distress. Pulmonary.  Lungs clear bilaterally, normal respiratory effort. CV.  Regular rate and rhythm, no JVD, rub or murmur. Abdomen.  Soft, nontender, nondistended, BS positive. CNS.  Alert and oriented .  No focal neurologic deficit. Extremities.  No edema, no cyanosis, pulses intact and symmetrical. Psychiatry.  Appears to have some cognitive  impairment  Condition at discharge: stable  The results of significant diagnostics from this hospitalization (including imaging, microbiology, ancillary and laboratory) are listed below for reference.   Imaging Studies: ECHOCARDIOGRAM COMPLETE  Result Date: 07/21/2022    ECHOCARDIOGRAM REPORT   Patient Name:   ZYKERRIA TANTON Date of Exam: 07/20/2022 Medical Rec #:  [id number]     Height:       63.5 in Accession #:    [id number]    Weight:       171.8 lb Date of Birth:  July 01, 1933     BSA:          1.823 m Patient Age:    68 years      BP:           131/83 mmHg Patient Gender: F             HR:           85 bpm. Exam Location:  [facility] Procedure: 2D Echo, Cardiac Doppler and Color Doppler Indications:     CHF  History:         Patient has prior history of Echocardiogram examinations, most                  recent 02/09/2022. CHF, Stroke and Pulmonary HTN,                  Arrythmias:Atrial Fibrillation and Tachycardia,                  Signs/Symptoms:Edema and Fatigue; Risk Factors:Hypertension,                  Diabetes and Dyslipidemia. S/P [facility] Clip June 2022. COVID +.  Sonographer:     Wenda Low Referring Phys:  [id number] SIRA Diagnosing Phys: Ida Rogue MD IMPRESSIONS  1. Left ventricular ejection fraction, by estimation, is 55 to 60%. The left ventricle has normal function. The left ventricle has no regional wall motion abnormalities. There is severe left ventricular hypertrophy. Left ventricular diastolic parameters  are indeterminate. There is the interventricular septum  is flattened in systole and diastole, consistent with right ventricular pressure and volume overload.  2. Right ventricular systolic function is normal. The right ventricular size is moderately enlarged. There is severely elevated pulmonary artery systolic pressure. The estimated right ventricular systolic pressure is 37.8 mmHg.  3. Left atrial size was severely dilated.  4. Right atrial size was moderately dilated.  5. The  mitral valve has been repaired/replaced. s/p Mitra clip noted. Moderate to severe mitral valve regurgitation. No evidence of mitral stenosis.  6. Tricuspid valve regurgitation is severe.  7. The aortic valve is tricuspid. Aortic valve regurgitation is not visualized. No aortic stenosis is present.  8. Mild to moderate pulmonic stenosis.  9. There is borderline dilatation of the ascending aorta, measuring 38 mm. 10. The inferior vena cava is normal in size with greater than 50% respiratory variability, suggesting right atrial pressure of 3 mmHg. FINDINGS  Left Ventricle: Left ventricular ejection fraction, by estimation, is 55 to 60%. The left ventricle has normal function. The left ventricle has no regional wall motion abnormalities. The left ventricular internal cavity size was normal in size. There is  severe left ventricular hypertrophy. The interventricular septum is flattened in systole and diastole, consistent with right ventricular pressure and volume overload. Left ventricular diastolic parameters are indeterminate. Right Ventricle: The right ventricular size is moderately enlarged. No increase in right ventricular wall thickness. Right ventricular systolic function is normal. There is severely elevated pulmonary artery systolic pressure. The tricuspid regurgitant velocity is 3.92 m/s, and with an assumed right atrial pressure of 15 mmHg, the estimated right ventricular systolic pressure is 58.8 mmHg. Left Atrium: Left atrial size was severely dilated. Right Atrium: Right atrial size was moderately dilated. Pericardium: There is no evidence of pericardial effusion. Mitral Valve: The mitral valve has been repaired/replaced. Moderate to severe mitral valve regurgitation. No evidence of mitral valve stenosis. MV peak gradient, 17.1 mmHg. The mean mitral valve gradient is 4.0 mmHg. Tricuspid Valve: The tricuspid valve is normal in structure. Tricuspid valve regurgitation is severe. No evidence of tricuspid  stenosis. Aortic Valve: The aortic valve is tricuspid. Aortic valve regurgitation is not visualized. No aortic stenosis is present. Aortic valve mean gradient measures 5.5 mmHg. Aortic valve peak gradient measures 11.2 mmHg. Aortic valve area, by VTI measures 1.53  cm. Pulmonic Valve: The pulmonic valve was normal in structure. Pulmonic valve regurgitation is not visualized. Mild to moderate pulmonic stenosis. Aorta: The aortic root is normal in size and structure. There is borderline dilatation of the ascending aorta, measuring 38 mm. Venous: The inferior vena cava is normal in size with greater than 50% respiratory variability, suggesting right atrial pressure of 3 mmHg. IAS/Shunts: No atrial level shunt detected by color flow Doppler.  LEFT VENTRICLE PLAX 2D LVIDd:         5.40 cm LVIDs:         3.60 cm LV PW:         1.40 cm LV IVS:        2.05 cm LVOT diam:     2.00 cm LV SV:         48 LV SV Index:   27 LVOT Area:     3.14 cm  RIGHT VENTRICLE RV Basal diam:  5.70 cm RV Mid diam:    3.90 cm RV S prime:     10.10 cm/s TAPSE (M-mode): 2.2 cm LEFT ATRIUM              Index  RIGHT ATRIUM           Index LA diam:        6.20 cm  3.40 cm/m    RA Area:     36.00 cm LA Vol (A2C):   267.0 ml 146.43 ml/m  RA Volume:   144.00 ml 78.98 ml/m LA Vol (A4C):   238.0 ml 130.53 ml/m LA Biplane Vol: 253.0 ml 138.76 ml/m  AORTIC VALVE                     PULMONIC VALVE AV Area (Vmax):    1.98 cm      PV Vmax:       1.11 m/s AV Area (Vmean):   1.64 cm      PV Peak grad:  4.9 mmHg AV Area (VTI):     1.53 cm AV Vmax:           167.00 cm/s AV Vmean:          102.750 cm/s AV VTI:            0.316 m AV Peak Grad:      11.2 mmHg AV Mean Grad:      5.5 mmHg LVOT Vmax:         105.00 cm/s LVOT Vmean:        53.800 cm/s LVOT VTI:          0.154 m LVOT/AV VTI ratio: 0.49  AORTA Ao Root diam: 3.60 cm Ao Asc diam:  3.80 cm MITRAL VALVE                  TRICUSPID VALVE MV Area (PHT): 2.02 cm       TR Peak grad:   61.5 mmHg MV  Area VTI:   1.20 cm       TR Vmax:        392.00 cm/s MV Peak grad:  17.1 mmHg MV Mean grad:  4.0 mmHg       SHUNTS MV Vmax:       2.07 m/s       Systemic VTI:  0.15 m MV Vmean:      91.1 cm/s      Systemic Diam: 2.00 cm MV Decel Time: 375 msec MR Peak grad:    117.9 mmHg MR Mean grad:    74.0 mmHg MR Vmax:         543.00 cm/s MR Vmean:        404.0 cm/s MR PISA:         4.54 cm MR PISA Eff ROA: 77 mm MR PISA Radius:  0.85 cm MV E velocity: 147.00 cm/s Ida Rogue MD Electronically signed by Ida Rogue MD Signature Date/Time: 07/21/2022/2:26:49 PM    Final    DG Chest Port 1 View  Result Date: 07/20/2022 CLINICAL DATA:  HF hx, swelling legs, EXAM: PORTABLE CHEST 1 VIEW COMPARISON:  June 21, 2022 FINDINGS: The cardiomediastinal silhouette is unchanged and enlarged in contour.Atherosclerotic calcifications. No pleural effusion. No pneumothorax. Unchanged elevation of the RIGHT hemidiaphragm. Increased diffuse interstitial prominence and peribronchial cuffing. IMPRESSION: Constellation of findings are most consistent with pulmonary edema. Electronically Signed   By: Valentino Saxon M.D.   On: 07/20/2022 09:11    Microbiology: Results for orders placed or performed during the hospital encounter of 07/20/22  Resp panel by RT-PCR (RSV, Flu A&B, Covid) Anterior Nasal Swab     Status: Abnormal   Collection Time: 07/20/22  8:49 AM  Specimen: Anterior Nasal Swab  Result Value Ref Range Status   SARS Coronavirus 2 by RT PCR POSITIVE (A) NEGATIVE Final    Comment: (NOTE) SARS-CoV-2 target nucleic acids are DETECTED.  The SARS-CoV-2 RNA is generally detectable in upper respiratory specimens during the acute phase of infection. Positive results are indicative of the presence of the identified virus, but do not rule out bacterial infection or co-infection with other pathogens not detected by the test. Clinical correlation with patient history and other diagnostic information is necessary to  determine patient infection status. The expected result is Negative.  Fact Sheet for Patients: EntrepreneurPulse.com.au  Fact Sheet for Healthcare Providers: IncredibleEmployment.be  This test is not yet approved or cleared by the [place] FDA and  has been authorized for detection and/or diagnosis of SARS-CoV-2 by FDA under an Emergency Use Authorization (EUA).  This EUA will remain in effect (meaning this test can be used) for the duration of  the COVID-19 declaration under Section 564(b)(1) of the A ct, 21 U.S.C. section 360bbb-3(b)(1), unless the authorization is terminated or revoked sooner.     Influenza A by PCR NEGATIVE NEGATIVE Final   Influenza B by PCR NEGATIVE NEGATIVE Final    Comment: (NOTE) The Xpert Xpress SARS-CoV-2/FLU/RSV plus assay is intended as an aid in the diagnosis of influenza from Nasopharyngeal swab specimens and should not be used as a sole basis for treatment. Nasal washings and aspirates are unacceptable for Xpert Xpress SARS-CoV-2/FLU/RSV testing.  Fact Sheet for Patients: EntrepreneurPulse.com.au  Fact Sheet for Healthcare Providers: IncredibleEmployment.be  This test is not yet approved or cleared by the [place] FDA and has been authorized for detection and/or diagnosis of SARS-CoV-2 by FDA under an Emergency Use Authorization (EUA). This EUA will remain in effect (meaning this test can be used) for the duration of the COVID-19 declaration under Section 564(b)(1) of the Act, 21 U.S.C. section 360bbb-3(b)(1), unless the authorization is terminated or revoked.     Resp Syncytial Virus by PCR NEGATIVE NEGATIVE Final    Comment: (NOTE) Fact Sheet for Patients: EntrepreneurPulse.com.au  Fact Sheet for Healthcare Providers: IncredibleEmployment.be  This test is not yet approved or cleared by the [place] FDA and has  been authorized for detection and/or diagnosis of SARS-CoV-2 by FDA under an Emergency Use Authorization (EUA). This EUA will remain in effect (meaning this test can be used) for the duration of the COVID-19 declaration under Section 564(b)(1) of the Act, 21 U.S.C. section 360bbb-3(b)(1), unless the authorization is terminated or revoked.  Performed at [facility], [address] [place], [place] [address]   Blood Culture (routine x 2)     Status: None   Collection Time: 07/20/22  8:49 AM   Specimen: BLOOD  Result Value Ref Range Status   Specimen Description BLOOD BLOOD RIGHT HAND  Final   Special Requests   Final    BOTTLES DRAWN AEROBIC AND ANAEROBIC Blood Culture adequate volume   Culture   Final    NO GROWTH 5 DAYS Performed at [facility], [address], [place], [place] [address]    Report Status 07/25/2022 FINAL  Final  Blood Culture (routine x 2)     Status: None   Collection Time: 07/20/22  8:50 AM   Specimen: BLOOD  Result Value Ref Range Status   Specimen Description BLOOD BLOOD LEFT ARM  Final   Special Requests   Final    BOTTLES DRAWN AEROBIC AND ANAEROBIC Blood Culture adequate volume  Culture   Final    NO GROWTH 5 DAYS Performed at [facility], [address] [place], [place] [address]    Report Status 07/25/2022 FINAL  Final  Urine Culture     Status: Abnormal   Collection Time: 07/20/22  5:49 PM   Specimen: Urine, Random  Result Value Ref Range Status   Specimen Description   Final    URINE, RANDOM Performed at [facility], [address] [place], [place] [address]    Special Requests   Final    NONE Reflexed from [id number] Performed at [facility], [address] [place], [place] [address]    Culture (A)  Final    >=100,000 COLONIES/mL ENTEROCOCCUS FAECIUM 20,000 COLONIES/mL PSEUDOMONAS AERUGINOSA    Report Status 07/22/2022 FINAL  Final   Organism ID, Bacteria ENTEROCOCCUS FAECIUM (A)   Final   Organism ID, Bacteria PSEUDOMONAS AERUGINOSA (A)  Final      Susceptibility   Enterococcus faecium - MIC*    AMPICILLIN >=32 RESISTANT Resistant     LEVOFLOXACIN 2 SENSITIVE Sensitive     NITROFURANTOIN 64 INTERMEDIATE Intermediate     VANCOMYCIN <=0.5 SENSITIVE Sensitive     GENTAMICIN SYNERGY SENSITIVE Sensitive     * >=100,000 COLONIES/mL ENTEROCOCCUS FAECIUM   Pseudomonas aeruginosa - MIC*    CEFTAZIDIME 4 SENSITIVE Sensitive     CIPROFLOXACIN <=0.25 SENSITIVE Sensitive     GENTAMICIN 2 SENSITIVE Sensitive     IMIPENEM 1 SENSITIVE Sensitive     PIP/TAZO 8 SENSITIVE Sensitive     CEFEPIME 2 SENSITIVE Sensitive     * 20,000 COLONIES/mL PSEUDOMONAS AERUGINOSA    Labs: CBC: Recent Labs  Lab 07/20/22 0849 07/21/22 0543 07/22/22 0519 07/23/22 0514  WBC 4.8 5.6 6.2 6.0  NEUTROABS 3.4  --   --   --   HGB 13.6 12.6 12.4 12.0  HCT 42.6 39.5 38.5 37.1  MCV 98.2 98.0 97.2 97.9  PLT 186 164 165 193   Basic Metabolic Panel: Recent Labs  Lab 07/20/22 0849 07/21/22 0543 07/22/22 0519 07/23/22 0514  NA 138 139 138 139  K 3.7 3.4* 3.1* 3.6  CL 101 100 96* 100  CO2 26 29 33* 33*  GLUCOSE 136* 109* 136* 124*  BUN '14 17 20 20  '$ CREATININE 0.62 0.81 0.72 0.58  CALCIUM 9.2 8.8* 8.8* 8.4*  MG  --  1.8 1.8 1.7  PHOS  --  5.1* 2.7 1.6*   Liver Function Tests: Recent Labs  Lab 07/21/22 0543 07/22/22 0519 07/23/22 0514  AST '26 24 23  '$ ALT '20 18 17  '$ ALKPHOS 64 64 65  BILITOT 1.7* 1.4* 1.3*  PROT 6.3* 6.1* 6.0*  ALBUMIN 3.3* 3.1* 3.1*   CBG: Recent Labs  Lab 07/24/22 1216 07/24/22 1600 07/24/22 2102 07/25/22 0102 07/25/22 0850  GLUCAP 97 136* 174* 172* 128*    Discharge time spent: greater than 30 minutes.  This record has been created using Systems analyst. Errors have been sought and corrected,but may not always be located. Such creation errors do not reflect on the standard of care.   Signed: Lorella Nimrod, MD [facility] 07/25/2022

## 2022-07-25 NOTE — Plan of Care (Signed)
  Problem: Elimination: Goal: Will not experience complications related to urinary retention Outcome: Not Progressing   Problem: Education: Goal: Knowledge of General Education information will improve Description: Including pain rating scale, medication(s)/side effects and non-pharmacologic comfort measures Outcome: Progressing   Problem: Health Behavior/Discharge Planning: Goal: Ability to manage health-related needs will improve Outcome: Progressing   Problem: Clinical Measurements: Goal: Ability to maintain clinical measurements within normal limits will improve Outcome: Progressing Goal: Will remain free from infection Outcome: Progressing Goal: Diagnostic test results will improve Outcome: Progressing Goal: Respiratory complications will improve Outcome: Progressing Goal: Cardiovascular complication will be avoided Outcome: Progressing   Problem: Activity: Goal: Risk for activity intolerance will decrease Outcome: Progressing   Problem: Nutrition: Goal: Adequate nutrition will be maintained Outcome: Progressing   Problem: Coping: Goal: Level of anxiety will decrease Outcome: Progressing   Problem: Elimination: Goal: Will not experience complications related to bowel motility Outcome: Progressing   Problem: Pain Managment: Goal: General experience of comfort will improve Outcome: Progressing   Problem: Safety: Goal: Ability to remain free from injury will improve Outcome: Progressing   Problem: Skin Integrity: Goal: Risk for impaired skin integrity will decrease Outcome: Progressing   Problem: Education: Goal: Ability to describe self-care measures that may prevent or decrease complications (Diabetes Survival Skills Education) will improve Outcome: Progressing Goal: Individualized Educational Video(s) Outcome: Progressing   Problem: Coping: Goal: Ability to adjust to condition or change in health will improve Outcome: Progressing   Problem: Fluid  Volume: Goal: Ability to maintain a balanced intake and output will improve Outcome: Progressing   Problem: Health Behavior/Discharge Planning: Goal: Ability to identify and utilize available resources and services will improve Outcome: Progressing Goal: Ability to manage health-related needs will improve Outcome: Progressing   Problem: Metabolic: Goal: Ability to maintain appropriate glucose levels will improve Outcome: Progressing   Problem: Nutritional: Goal: Maintenance of adequate nutrition will improve Outcome: Progressing Goal: Progress toward achieving an optimal weight will improve Outcome: Progressing   Problem: Skin Integrity: Goal: Risk for impaired skin integrity will decrease Outcome: Progressing   Problem: Tissue Perfusion: Goal: Adequacy of tissue perfusion will improve Outcome: Progressing

## 2022-07-25 NOTE — TOC Transition Note (Signed)
Transition of Care [facility]) - CM/SW Discharge Note   Patient Details  Name: Linda [name] MRN: [id number] Date of Birth: 07-07-1933  [facility]) CM/SW Contact:  Laurena Slimmer, RN Phone Number: 07/25/2022, 11:35 AM   Clinical Narrative:    Spoke with Leonette Nutting at [facility]. Patient can be accepted today.  Patient daughter, [name], and Son,Jonathan notified of discharge.  Discharge summary, transfer report, and FL2 sent in the Hub. [name] Page notified information has been sent. Per Maudie Mercury patient has been assigned to room #340. Nurse will call report to [phone].  Face sheet and medical necessity forms printed to the floor to be added to EMS packet.  EMS arranged.   TOC signing off.      Barriers to Discharge: Continued Medical Work up   Patient Goals and CMS Choice      Discharge Placement                         Discharge Plan and Services Additional resources added to the After Visit Summary for                                       Social Determinants of Health (SDOH) Interventions SDOH Screenings   Food Insecurity: No Food Insecurity (07/20/2022)  Housing: Low Risk  (07/20/2022)  Transportation Needs: No Transportation Needs (07/20/2022)  Utilities: Not At Risk (07/20/2022)  Depression (PHQ2-9): Low Risk  (11/24/2021)  Financial Resource Strain: Low Risk  (11/01/2021)  Physical Activity: Inactive (08/25/2020)  Social Connections: Moderately Integrated (11/01/2021)  Stress: No Stress Concern Present (11/01/2021)  Tobacco Use: Low Risk  (07/20/2022)     Readmission Risk Interventions     No data to display

## 2022-07-25 NOTE — NC FL2 (Signed)
[place] LEVEL OF CARE FORM     IDENTIFICATION  Patient Name: Linda [name] Birthdate: 06-23-1933 Sex: female Admission Date (Current Location): 07/20/2022  [place] and [place] Number:  Engineering geologist and Address:  [facility], [address], [place], [place] [address]      Provider Number: [id number]  Attending Physician Name and Address:  Lorella Nimrod, MD  Relative Name and Phone Number:  [name] Russell,[phone]    Current Level of Care: Hospital Recommended Level of Care: [place] Prior Approval Number:    Date Approved/Denied:   PASRR Number: [id number]  Discharge Plan: SNF    Current Diagnoses: Patient Active Problem List   Diagnosis Date Noted   Bilateral lower extremity edema 07/25/2022   Cellulitis of right lower extremity 07/25/2022   CHF (congestive heart failure) ([place]) 07/20/2022   Seizures ([place]) 06/22/2022   Left middle cerebral artery stroke ([place]) 02/13/2022   Acute ischemic left MCA stroke ([place]) 02/08/2022   Status post stroke 02/08/2022   Middle cerebral artery embolism, left 02/08/2022   Endotracheal tube present    Chronic HFrEF (heart failure with reduced ejection fraction) (HCC)    NSVT (nonsustained ventricular tachycardia) ([place]) 02/06/2022   Hypoxia 02/05/2022   Chronic anticoagulation 02/05/2022   HFrEF (heart failure with reduced ejection fraction) ([place]) 02/05/2022   Pulmonary hypertension ([place]) 02/05/2022   Valvular cardiomyopathy ([place]) 10/22/2021   S/P mitral valve clip implantation 10/05/2021   Varicose veins of both lower extremities 07/13/2021   Injury of right leg 07/06/2021   Right leg pain 07/06/2021   Callus of foot 07/06/2021   Proximal muscle weakness 08/24/2020   Severe mitral regurgitation s/p MitraClip June 2022 07/09/2020   Fatigue 05/29/2020   Diuretic-induced hypokalemia 12/16/2019   Myalgia due to statin 12/15/2019   Acquired thrombophilia ([place])  12/15/2019   Skin lesion 08/27/2018   Hospital discharge follow-up 05/25/2018   Abnormal chest x-ray 05/25/2018   History of CVA (cerebrovascular accident) without residual deficits 05/10/2018   Parent-child estrangement nec 02/09/2018   Basal cell carcinoma (BCC) of right lower extremity 08/08/2017   Hematuria, gross 08/08/2017   Extremity atherosclerosis with intermittent claudication ([place]) 08/08/2017   Generalized anxiety disorder 01/31/2017   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   Controlled type 2 diabetes mellitus with microalbuminuria, without long-term current use of insulin ([place]) 01/20/2016   Insomnia secondary to anxiety 10/09/2015   Polycythemia, secondary 07/04/2015   Pre-syncope 07/02/2015   Osteopenia 08/21/2014   Gastritis 05/26/2014   Epistaxis 03/18/2014   GERD (gastroesophageal reflux disease) 03/04/2014   Medicare annual wellness visit, subsequent 01/13/2014   Essential hypertension 11/04/2013   Screening for breast cancer 07/01/2013   Left shoulder pain 03/21/2013   Hoarseness of voice 03/21/2013   Edema 09/05/2010   VENTRICULAR HYPERTROPHY, LEFT 08/16/2010   Hyperlipidemia 06/03/2010   ATRIAL FIBRILLATION 08/13/2009    Orientation RESPIRATION BLADDER Height & Weight     Self  O2 (O22L nasal cannula)  (Intermittent cath) Weight: 77.1 kg Height:  5' 3.5" (161.3 cm)  BEHAVIORAL SYMPTOMS/MOOD NEUROLOGICAL BOWEL NUTRITION STATUS  Other (Comment) (n/a)  (n/a) Continent Diet (2gm Sodium)  AMBULATORY STATUS COMMUNICATION OF NEEDS Skin   Limited Assist Verbally                         Personal Care Assistance Level of Assistance  Bathing, Feeding, Dressing Bathing Assistance: Limited assistance Feeding assistance: Limited assistance Dressing Assistance: Limited assistance  Functional Limitations Info  Sight, Hearing Sight Info: Impaired Hearing Info: Impaired      SPECIAL CARE FACTORS FREQUENCY  PT (By licensed PT), OT (By licensed OT)     PT  Frequency: Min 2x weekly OT Frequency: Min 2x weekly            Contractures Contractures Info: Not present    Additional Factors Info  Code Status, Allergies Code Status Info: DNR Allergies Info: Barium Iodide, Lipitor (Atorvastatin), Statins, Crestor (Rosuvastatin Calcium), Penicillins, Sulfa Antibiotics, Sulfonamide Derivatives           Current Medications (07/25/2022):  This is the current hospital active medication list Current Facility-Administered Medications  Medication Dose Route Frequency Provider Last Rate Last Admin   apixaban (ELIQUIS) tablet 5 mg  5 mg Oral BID Lucienne Minks, MD   5 mg at 07/25/22 4373   bacitracin-polymyxin b (POLYSPORIN) ophthalmic ointment   Both Eyes QID Lucienne Minks, MD   Given at 07/25/22 5789   docusate sodium (COLACE) capsule 100 mg  100 mg Oral BID Lucienne Minks, MD   100 mg at 07/25/22 0836   ezetimibe (ZETIA) tablet 10 mg  10 mg Oral Daily Lucienne Minks, MD   10 mg at 07/25/22 0837   FLUoxetine (PROZAC) capsule 20 mg  20 mg Oral QHS Lucienne Minks, MD   20 mg at 07/24/22 2226   furosemide (LASIX) tablet 40 mg  40 mg Oral Daily Lucienne Minks, MD   40 mg at 07/25/22 0837   insulin aspart (novoLOG) injection 0-5 Units  0-5 Units Subcutaneous QHS Lucienne Minks, MD   2 Units at 07/23/22 2116   insulin aspart (novoLOG) injection 0-9 Units  0-9 Units Subcutaneous TID WC Lucienne Minks, MD   1 Units at 07/24/22 1707   levETIRAcetam (KEPPRA) tablet 500 mg  500 mg Oral BID Lucienne Minks, MD   500 mg at 07/25/22 0836   loratadine (CLARITIN) tablet 10 mg  10 mg Oral Daily Lucienne Minks, MD   10 mg at 07/25/22 0836   losartan (COZAAR) tablet 25 mg  25 mg Oral Daily Lucienne Minks, MD   25 mg at 07/25/22 0837   ondansetron (ZOFRAN) injection 4 mg  4 mg Intravenous Q6H PRN Lucienne Minks, MD   4 mg at 07/20/22 1245   pantoprazole (PROTONIX) EC tablet 40 mg  40 mg Oral Daily Lucienne Minks, MD   40 mg at 07/25/22 0837   vancomycin (VANCOCIN) IVPB 1000 mg/200 mL  premix  1,000 mg Intravenous Q24H Lorella Nimrod, MD   Stopped at 07/24/22 1311     Discharge Medications: Please see discharge summary for a list of discharge medications.  Relevant Imaging Results:  Relevant Lab Results:   Additional Information [id number]  Laurena Slimmer, RN

## 2022-07-25 NOTE — Progress Notes (Signed)
Occupational Therapy Treatment Patient Details Name: Linda [name] MRN: [id number] DOB: 05/18/1934 Today's Date: 07/25/2022   History of present illness Pt is an 89 y.o. female presenting to hospital 07/20/22 with c/o B LE swelling and R leg erythema.  (+) COVID but per pt's daughter that is not a new finding and pt has been COVID + for several weeks.  Pt admitted with acute hypoxic respiratory failure d/t acute diastolic CHF; R lower leg cellulitis.  PMH includes stroke, seizures, HOH, CHF, a-fib on Eliquis, anxiety, UTI, RCR, and post stroke epilepsy.   OT comments  Ms Macpherson was seen for OT treatment on this date. Upon arrival to room pt seated in bed, agreeable to tx. Pt requires CGA + RW toilet t/f. MOD A pericare, difficulty sequencing. MOD A don pants/underwear. Pt making good progress toward goals, will continue to follow POC. Discharge recommendation remains appropriate.     Recommendations for follow up therapy are one component of a multi-disciplinary discharge planning process, led by the attending physician.  Recommendations may be updated based on patient status, additional functional criteria and insurance authorization.    Follow Up Recommendations  Skilled nursing-short term rehab (<3 hours/day)     Assistance Recommended at Discharge Frequent or constant Supervision/Assistance  Patient can return home with the following  A little help with walking and/or transfers;A little help with bathing/dressing/bathroom;Assistance with cooking/housework;Assist for transportation   Equipment Recommendations  None recommended by OT    Recommendations for Other Services      Precautions / Restrictions Precautions Precautions: Fall Restrictions Weight Bearing Restrictions: No       Mobility Bed Mobility Overal bed mobility: Needs Assistance Bed Mobility: Supine to Sit     Supine to sit: Min assist          Transfers Overall transfer level: Needs assistance Equipment used:  Rolling walker (2 wheels) Transfers: Sit to/from Stand Sit to Stand: Min guard                 Balance Overall balance assessment: Needs assistance Sitting-balance support: Feet supported, No upper extremity supported Sitting balance-Leahy Scale: Good     Standing balance support: Single extremity supported, During functional activity Standing balance-Leahy Scale: Fair                             ADL either performed or assessed with clinical judgement   ADL Overall ADL's : Needs assistance/impaired                                       General ADL Comments: CGA + RW toilet t/f. MOD A pericare, difficulty sequencing. MOD A don pants/underwear      Cognition Arousal/Alertness: Awake/alert Behavior During Therapy: WFL for tasks assessed/performed Overall Cognitive Status: History of cognitive impairments - at baseline                                 General Comments: expressive aphasia baseline, follows 1  step commands         Pertinent Vitals/ Pain       Pain Assessment Pain Assessment: No/denies pain   Frequency  Min 2X/week        Progress Toward Goals  OT Goals(current goals can now be found in the care plan section)  Progress towards OT goals: Progressing toward goals  Acute Rehab OT Goals Patient Stated Goal: to go home OT Goal Formulation: With patient/family Time For Goal Achievement: 08/04/22 Potential to Achieve Goals: Good ADL Goals Pt Will Perform Grooming: with modified independence;standing Pt Will Perform Lower Body Dressing: with modified independence;sit to/from stand Pt Will Transfer to Toilet: with modified independence;ambulating;regular height toilet  Plan Discharge plan remains appropriate;Frequency remains appropriate    Co-evaluation                 AM-PAC OT "6 Clicks" Daily Activity     Outcome Measure   Help from another person eating meals?: A Little Help from another  person taking care of personal grooming?: A Little Help from another person toileting, which includes using toliet, bedpan, or urinal?: A Little Help from another person bathing (including washing, rinsing, drying)?: A Little Help from another person to put on and taking off regular upper body clothing?: A Little Help from another person to put on and taking off regular lower body clothing?: A Little 6 Click Score: 18    End of Session    OT Visit Diagnosis: Other abnormalities of gait and mobility (R26.89);Muscle weakness (generalized) (M62.81)   Activity Tolerance Patient tolerated treatment well   Patient Left in bed;with call bell/phone within reach;with nursing/sitter in room;with family/visitor present   Nurse Communication Mobility status        Time: 7225-7505 OT Time Calculation (min): 23 min  Charges: OT General Charges $OT Visit: 1 Visit OT Treatments $Self Care/Home Management : 23-37 mins  Dessie Coma, M.S. OTR/L  07/25/22, 1:09 PM  ascom [phone]

## 2022-07-26 ENCOUNTER — Encounter: Payer: Medicare Other | Admitting: Family

## 2022-07-26 NOTE — Progress Notes (Signed)
Patient ID: Linda [name], female    DOB: 10-Jan-1934, 89 y.o.   MRN: [id number]  HPI  Ms Zadroga is a 89 y/o female with a history of atrial fibrillation, hyperlipidemia, HTN, CKD, stroke, anxiety, GERD, parathyroid disease, pulmonary HTN, UTI and chronic heart failure.   Echo 07/20/22 showed an EF of 55-60% along with severe LVH/ LAE, severely elevated PA pressure of 76.5 mmHg, moderate/ severe MR with mitra clip & severe TR.   Admitted 07/20/22 due to edema. Found to have elevated BNP along with testing + for covid. Unable to be weaned off of oxygen. Admitted 06/21/22 due to seizures and found to be in HF.   She presents today for her initial visit with a chief complaint of minimal SOB with exertion. Chronic in nature. Associated pedal edema (R>L) along with this. Per her daughter, no dizziness, difficulty sleeping, abdominal distention, chest pain, cough, fatigue or change in appetite.   Patient is very anxious today and is sitting with her head down and is wringing her hands in her lap. Daughter says that this is the first time being transported by the facility [facility] Lei in a wheelchair. Entire history is obtained from the daughter as the patient does not answer any questions.   Past Medical History:  Diagnosis Date   Anxiety    Arrhythmia    paroxysmal atrial fibrillation   CHF (congestive heart failure) (HCC)    Colon polyp    Diverticulitis    Dysrhythmia    GERD (gastroesophageal reflux disease)    Hard of hearing    Hoarseness    Hyperlipidemia    Hypertension    Kidney stone    Motion sickness    Parathyroid disease (HCC)    Parathyroidectomy    PONV (postoperative nausea and vomiting)    Pre-diabetes    Pulmonary HTN (HCC)    PVC (premature ventricular contraction)    Skin cancer    Squamous cell carcinoma of skin 07/19/2021   right lower pretibia lateral, EDC   Squamous cell carcinoma of skin 10/13/2014   R ant neck - KA pattern   Squamous cell carcinoma of skin 09/15/2021    Left dorsal hand, shaved down to fat at time of biopsy so should already be removed.  Will observe for recurrence.   Stroke [facility])    TIA (transient ischemic attack) 04/2018   no deficitis   UTI (lower urinary tract infection)    Varicose veins of both lower extremities    Wears hearing aid in both ears    Past Surgical History:  Procedure Laterality Date   ABDOMINAL HYSTERECTOMY     APPENDECTOMY     CATARACT EXTRACTION W/PHACO Left 03/05/2019   Procedure: CATARACT EXTRACTION PHACO AND INTRAOCULAR LENS PLACEMENT ([place])   0:57 15.6% 9.05;  Surgeon: Leandrew Koyanagi, MD;  Location: [place];  Service: Ophthalmology;  Laterality: Left;  Diabetic - diet cotrolled   CATARACT EXTRACTION W/PHACO Right 04/09/2019   Procedure: CATARACT EXTRACTION PHACO AND INTRAOCULAR LENS PLACEMENT ([facility]) RIGHT DIABETIC 00:47.6  15.9%  7.60;  Surgeon: Leandrew Koyanagi, MD;  Location: [place];  Service: Ophthalmology;  Laterality: Right;   CHOLECYSTECTOMY  2002   ESOPHAGOGASTRODUODENOSCOPY (EGD) WITH PROPOFOL N/A 12/17/2019   Procedure: ESOPHAGOGASTRODUODENOSCOPY (EGD) WITH PROPOFOL;  Surgeon: Robert Bellow, MD;  Location: [facility];  Service: Endoscopy;  Laterality: N/A;   IR CT HEAD LTD  02/08/2022   IR PERCUTANEOUS ART THROMBECTOMY/INFUSION INTRACRANIAL INC DIAG ANGIO  02/08/2022   LITHOTRIPSY  MITRAL VALVE REPAIR  12/14/2020   PARATHYROIDECTOMY  2004   1 removed   RADIOLOGY WITH ANESTHESIA N/A 02/08/2022   Procedure: IR WITH ANESTHESIA;  Surgeon: Luanne Bras, MD;  Location: [place];  Service: Radiology;  Laterality: N/A;   ROTATOR CUFF REPAIR  2002   TEE WITHOUT CARDIOVERSION N/A 09/29/2020   Procedure: TRANSESOPHAGEAL ECHOCARDIOGRAM (TEE);  Surgeon: Teodoro Spray, MD;  Location: [facility];  Service: Cardiovascular;  Laterality: N/A;   TONSILLECTOMY  1956   TONSILLECTOMY     TOTAL ABDOMINAL HYSTERECTOMY W/ BILATERAL SALPINGOOPHORECTOMY  1984   VEIN LIGATION AND  STRIPPING     Family History  Problem Relation Age of Onset   Heart disease Brother    Stroke Mother    Skin cancer Father    Breast cancer Paternal Grandmother    Social History   Tobacco Use   Smoking status: Never   Smokeless tobacco: Never  Substance Use Topics   Alcohol use: No   Allergies  Allergen Reactions   Barium Iodide     Unknown   Lipitor [Atorvastatin] Other (See Comments)    Myalgias    Statins Other (See Comments)    Myalgias    Crestor [Rosuvastatin Calcium] Other (See Comments)    Myalgias    Penicillins Rash   Sulfa Antibiotics Rash   Sulfonamide Derivatives Rash   Prior to Admission medications   Medication Sig Start Date End Date Taking? Authorizing Provider  apixaban (ELIQUIS) 5 MG TABS tablet Take 1 tablet (5 mg total) by mouth 2 (two) times daily. 02/24/22  Yes Angiulli, Lavon Paganini, PA-C  B Complex-C (B-COMPLEX WITH VITAMIN C) tablet Take 1 tablet by mouth daily. 02/24/22  Yes Angiulli, Lavon Paganini, PA-C  bacitracin-polymyxin b (POLYSPORIN) ophthalmic ointment Place into both eyes 4 (four) times daily. apply to eye every 12 hours while awake, for 2 more days 07/25/22  Yes Lorella Nimrod, MD  cholecalciferol (CHOLECALCIFEROL) 25 MCG tablet Take 1 tablet (1,000 Units total) by mouth daily. 02/24/22  Yes Angiulli, Lavon Paganini, PA-C  docusate sodium (COLACE) 100 MG capsule Take 1 capsule (100 mg total) by mouth 2 (two) times daily. 02/13/22  Yes de Yolanda Manges, Cortney E, NP  ezetimibe (ZETIA) 10 MG tablet Take 1 tablet (10 mg total) by mouth daily. 02/14/22  Yes de Yolanda Manges, Cortney E, NP  FLUoxetine (PROZAC) 20 MG capsule Take 1 capsule (20 mg total) by mouth at bedtime. Patient taking differently: Take 20 mg by mouth daily. 02/24/22  Yes Angiulli, Lavon Paganini, PA-C  levETIRAcetam (KEPPRA) 500 MG tablet Take 1 tablet (500 mg total) by mouth 2 (two) times daily. 06/27/22  Yes Oswald Hillock, MD  loratadine (CLARITIN) 10 MG tablet Take 1 tablet (10 mg total) by mouth daily. 02/24/22   Yes Angiulli, Lavon Paganini, PA-C  losartan (COZAAR) 25 MG tablet Take 1 tablet (25 mg total) by mouth daily. 06/27/22 06/27/23 Yes Oswald Hillock, MD  magnesium gluconate (MAGONATE) 500 MG tablet Take 0.5 tablets (250 mg total) by mouth at bedtime. 02/24/22  Yes Angiulli, Lavon Paganini, PA-C  melatonin 3 MG TABS tablet Take 1 tablet (3 mg total) by mouth at bedtime. 02/24/22  Yes Angiulli, Lavon Paganini, PA-C  metFORMIN (GLUCOPHAGE) 500 MG tablet Take 1 tablet (500 mg total) by mouth 2 (two) times daily with a meal. 02/27/22  Yes Angiulli, Lavon Paganini, PA-C  pantoprazole (PROTONIX) 40 MG tablet Take 1 tablet (40 mg total) by mouth daily at 12 noon. Patient taking  differently: Take 40 mg by mouth daily. 02/24/22  Yes Angiulli, Lavon Paganini, PA-C  potassium chloride (KLOR-CON) 20 MEQ packet Take 20 mEq by mouth daily.   Yes [provider]  torsemide (DEMADEX) 20 MG tablet Take 20 mg by mouth daily.   Yes [provider]   Review of Systems  Constitutional:  Negative for appetite change and fatigue.  HENT:  Negative for congestion, postnasal drip and sore throat.   Eyes: Negative.   Respiratory:  Positive for shortness of breath. Negative for cough and chest tightness.   Cardiovascular:  Positive for leg swelling. Negative for chest pain and palpitations.  Gastrointestinal:  Negative for abdominal distention and abdominal pain.  Endocrine: Negative.   Genitourinary: Negative.   Musculoskeletal:  Negative for back pain and neck pain.  Skin: Negative.   Allergic/Immunologic: Negative.   Neurological:  Negative for dizziness and light-headedness.  Hematological:  Does not bruise/bleed easily.  Psychiatric/Behavioral:  Negative for sleep disturbance. The patient is nervous/anxious.    Vitals:   07/27/22 1612  BP: (!) 165/86  Pulse: (!) 43  Resp: 18  SpO2: 99%  Weight: 171 lb (77.6 kg)   Wt Readings from Last 3 Encounters:  07/27/22 171 lb (77.6 kg)  07/24/22 169 lb 14.4 oz (77.1 kg)  06/22/22 163 lb  (73.9 kg)   Lab Results  Component Value Date   CREATININE 0.58 07/23/2022   CREATININE 0.72 07/22/2022   CREATININE 0.81 07/21/2022   Physical Exam Vitals and nursing note reviewed. Exam conducted with a chaperone present (daughter).  Constitutional:      Appearance: Normal appearance.  HENT:     Head: Normocephalic and atraumatic.  Cardiovascular:     Rate and Rhythm: Bradycardia present. Rhythm irregular.  Pulmonary:     Effort: Pulmonary effort is normal.     Breath sounds: No wheezing, rhonchi or rales.  Abdominal:     General: There is no distension.     Palpations: Abdomen is soft.     Tenderness: There is no abdominal tenderness.  Musculoskeletal:     Cervical back: Normal range of motion and neck supple.     Right lower leg: No tenderness. Edema (1+ pitting) present.     Left lower leg: No tenderness. Edema (trace pitting) present.  Skin:    General: Skin is warm and dry.  Neurological:     General: No focal deficit present.  Psychiatric:        Mood and Affect: Mood is anxious. Affect is flat.   Assessment & Plan:  1: Chronic heart failure with preserved ejection fraction with LVH/ LAE: - NYHA class II - euvolemic - order written for daily weights and to call for an overnight weight gain of > 2 pounds or a weekly weight gain of > 5 pounds - not adding salt but daughter doesn't know about sodium content of food at [facility] - losartan 12m QD - torsemide 221mQD - recent UTI so may not be a candidate for SGLT2 - BNP 07/20/22 was 678.7  2: HTN- - BP 165/86; patient is very anxious today - home care visit with PCP (AOuida Sills2/7/24 - BMP 07/23/22 showed sodium 139, potassium 3.6, creatinine 0.58 & GFR >60  3: DM- - A1c 02/09/22 was 6.6% - metformin 50072mID  4: Pulmonary HTN- - torsemide 85m16mily - oxygen at 2L around the clock  5: Stroke with memory changes- - apixaban 5mg 94m - ezetimibe 10mg 73my - fluoxetine 85mg55m  daily  6:  Chronic atrial fibrillation- - saw cardiology Nehemiah Massed) 05/15/22 - apixaban 41m BID   Facility medication list reviewed.   Due to patient anxiety with coming to the appt, will not make a return appt at this time. Advised daughter that she could call at anytime to make another appointment. Daughter is comfortable with this because she says that she is in close contact with Dr. AOuida Sillsat the facility.

## 2022-07-27 ENCOUNTER — Encounter: Payer: Self-pay | Admitting: Family

## 2022-07-27 ENCOUNTER — Ambulatory Visit: Payer: Medicare Other | Attending: Family | Admitting: Family

## 2022-07-27 VITALS — BP 165/86 | HR 43 | Resp 18 | Wt 171.0 lb

## 2022-07-27 DIAGNOSIS — I5032 Chronic diastolic (congestive) heart failure: Secondary | ICD-10-CM | POA: Diagnosis not present

## 2022-07-27 DIAGNOSIS — I482 Chronic atrial fibrillation, unspecified: Secondary | ICD-10-CM | POA: Diagnosis not present

## 2022-07-27 DIAGNOSIS — K219 Gastro-esophageal reflux disease without esophagitis: Secondary | ICD-10-CM | POA: Diagnosis not present

## 2022-07-27 DIAGNOSIS — Z79899 Other long term (current) drug therapy: Secondary | ICD-10-CM | POA: Diagnosis not present

## 2022-07-27 DIAGNOSIS — I10 Essential (primary) hypertension: Secondary | ICD-10-CM

## 2022-07-27 DIAGNOSIS — F419 Anxiety disorder, unspecified: Secondary | ICD-10-CM | POA: Diagnosis not present

## 2022-07-27 DIAGNOSIS — E785 Hyperlipidemia, unspecified: Secondary | ICD-10-CM | POA: Diagnosis not present

## 2022-07-27 DIAGNOSIS — I272 Pulmonary hypertension, unspecified: Secondary | ICD-10-CM | POA: Insufficient documentation

## 2022-07-27 DIAGNOSIS — Z7984 Long term (current) use of oral hypoglycemic drugs: Secondary | ICD-10-CM | POA: Insufficient documentation

## 2022-07-27 DIAGNOSIS — I63512 Cerebral infarction due to unspecified occlusion or stenosis of left middle cerebral artery: Secondary | ICD-10-CM | POA: Diagnosis not present

## 2022-07-27 DIAGNOSIS — R0602 Shortness of breath: Secondary | ICD-10-CM | POA: Diagnosis present

## 2022-07-27 DIAGNOSIS — I130 Hypertensive heart and chronic kidney disease with heart failure and stage 1 through stage 4 chronic kidney disease, or unspecified chronic kidney disease: Secondary | ICD-10-CM | POA: Diagnosis not present

## 2022-07-27 DIAGNOSIS — E1122 Type 2 diabetes mellitus with diabetic chronic kidney disease: Secondary | ICD-10-CM | POA: Diagnosis not present

## 2022-07-27 DIAGNOSIS — E119 Type 2 diabetes mellitus without complications: Secondary | ICD-10-CM | POA: Diagnosis not present

## 2022-07-27 DIAGNOSIS — N189 Chronic kidney disease, unspecified: Secondary | ICD-10-CM | POA: Diagnosis not present

## 2022-07-27 DIAGNOSIS — I4811 Longstanding persistent atrial fibrillation: Secondary | ICD-10-CM

## 2022-07-27 DIAGNOSIS — Z8673 Personal history of transient ischemic attack (TIA), and cerebral infarction without residual deficits: Secondary | ICD-10-CM | POA: Diagnosis not present

## 2022-07-27 NOTE — Patient Instructions (Addendum)
Begin weighing daily and call for an overnight weight gain of 3 pounds or more or a weekly weight gain of more than 5 pounds.   Call us in the future if you need [place] for anything.

## 2022-07-28 ENCOUNTER — Encounter: Payer: Self-pay | Admitting: Family

## 2022-08-03 ENCOUNTER — Ambulatory Visit (INDEPENDENT_AMBULATORY_CARE_PROVIDER_SITE_OTHER): Payer: Medicare Other | Admitting: Urology

## 2022-08-03 VITALS — BP 122/73 | HR 82 | Ht 63.0 in | Wt 164.0 lb

## 2022-08-03 DIAGNOSIS — Z87440 Personal history of urinary (tract) infections: Secondary | ICD-10-CM

## 2022-08-03 DIAGNOSIS — R339 Retention of urine, unspecified: Secondary | ICD-10-CM | POA: Diagnosis not present

## 2022-08-03 DIAGNOSIS — Z8742 Personal history of other diseases of the female genital tract: Secondary | ICD-10-CM | POA: Diagnosis not present

## 2022-08-03 DIAGNOSIS — I63512 Cerebral infarction due to unspecified occlusion or stenosis of left middle cerebral artery: Secondary | ICD-10-CM | POA: Diagnosis not present

## 2022-08-03 DIAGNOSIS — R309 Painful micturition, unspecified: Secondary | ICD-10-CM | POA: Diagnosis not present

## 2022-08-03 LAB — BLADDER SCAN AMB NON-IMAGING: Scan Result: 174

## 2022-08-03 MED ORDER — ESTRADIOL 0.1 MG/GM VA CREA: 1.0000 | TOPICAL_CREAM | VAGINAL | 12 refills | Status: AC

## 2022-08-03 NOTE — Patient Instructions (Signed)
For UTI prevention take cranberry tablets, probiotic, D-Mannose daily.

## 2022-08-03 NOTE — Progress Notes (Signed)
Haze Rushing Plume,acting as a Education administrator for Hollice Espy, MD.,have documented all relevant documentation on the behalf of Hollice Espy, MD,as directed by  Hollice Espy, MD while in the presence of Hollice Espy, MD.  08/03/2022 10:32 AM   Linda [name] 87/26/1935 [id number]  Referring provider: Kirk Ruths, MD [facility] [place],  [place] [address]  Chief Complaint  Patient presents with   Establish Care   Urinary Retention    ER follow up    HPI: 89 year-old female who presents today for evaluation of urinary retention. She presents today accompanied by her daughter.   The patient is very hard of hearing and is a limited historian today.  The majority of the history is provided by her daughter  She was admitted on 07/20/2022 through 07/25/2022 to [facility] for CHF and lower extremity cellulitis. During this admission, she was known to be in urinary retention. She had a Foley catheter for about 2 days and ended up having this removed and was managed for the remainder of the hospitalization with I&O cath. Notably, she was on antibiotics in the form of cefepine for right lower extremity edema. Her lung culture ended up growing enterococcus and pseudomonas and vancomycin was added to her regimen where she completed a course.   She was discharged to rehab and she has been managed by I&O cath twice daily since that time. Per the nursing staff, her PVRs in the facility have been more than 500. The last time she was catheterized was yesterday.   She was seen by me in 2019 for possible recurrent urinary tract infections and was started on topical estrogen. At that time her PVR was minimal and there was not a complaint of incomplete bladder emptying. 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   She reports  that she was hospitalized on 06/21/2022 for a seizure post stroke from August 2023 where she also contracted COVID and has been on supplemental oxygen since that time.   Today, she reports occasional painful urination and continued urinary retention.  Her daughter is concerned for infection because she just does not seem like herself.  She does not have any fevers or chills she does have occasional burning.  She reports that this morning, she was able to void just a little bit on her own.  Up until this point, that has not been happening on a regular basis.   Results for orders placed or performed in visit on 08/03/22  Bladder Scan (Post Void Residual) in office  Result Value Ref Range   Scan Result 174 ml      PMH: Past Medical History:  Diagnosis Date   Anxiety    Arrhythmia    paroxysmal atrial fibrillation   CHF (congestive heart failure) (HCC)    Colon polyp    Diverticulitis    Dysrhythmia    GERD (gastroesophageal reflux disease)    Hard of hearing    Hoarseness    Hyperlipidemia    Hypertension    Kidney stone    Motion sickness    Parathyroid disease (HCC)    Parathyroidectomy    PONV (postoperative nausea and vomiting)    Pre-diabetes    Pulmonary HTN (HCC)    PVC (premature ventricular contraction)    Skin cancer    Squamous  cell carcinoma of skin 07/19/2021   right lower pretibia lateral, EDC   Squamous cell carcinoma of skin 10/13/2014   R ant neck - KA pattern   Squamous cell carcinoma of skin 09/15/2021   Left dorsal hand, shaved down to fat at time of biopsy so should already be removed.  Will observe for recurrence.   Stroke [facility])    TIA (transient ischemic attack) 04/2018   no deficitis   UTI (lower urinary tract infection)    Varicose veins of both lower extremities    Wears hearing aid in both ears     Surgical History: Past Surgical History:  Procedure Laterality Date   ABDOMINAL HYSTERECTOMY     APPENDECTOMY     CATARACT EXTRACTION W/PHACO  Left 03/05/2019   Procedure: CATARACT EXTRACTION PHACO AND INTRAOCULAR LENS PLACEMENT ([place])   0:57 15.6% 9.05;  Surgeon: Leandrew Koyanagi, MD;  Location: [place];  Service: Ophthalmology;  Laterality: Left;  Diabetic - diet cotrolled   CATARACT EXTRACTION W/PHACO Right 04/09/2019   Procedure: CATARACT EXTRACTION PHACO AND INTRAOCULAR LENS PLACEMENT ([facility]) RIGHT DIABETIC 00:47.6  15.9%  7.60;  Surgeon: Leandrew Koyanagi, MD;  Location: [place];  Service: Ophthalmology;  Laterality: Right;   CHOLECYSTECTOMY  2002   ESOPHAGOGASTRODUODENOSCOPY (EGD) WITH PROPOFOL N/A 12/17/2019   Procedure: ESOPHAGOGASTRODUODENOSCOPY (EGD) WITH PROPOFOL;  Surgeon: Robert Bellow, MD;  Location: [facility];  Service: Endoscopy;  Laterality: N/A;   IR CT HEAD LTD  02/08/2022   IR PERCUTANEOUS ART THROMBECTOMY/INFUSION INTRACRANIAL INC DIAG ANGIO  02/08/2022   LITHOTRIPSY     MITRAL VALVE REPAIR  12/14/2020   PARATHYROIDECTOMY  2004   1 removed   RADIOLOGY WITH ANESTHESIA N/A 02/08/2022   Procedure: IR WITH ANESTHESIA;  Surgeon: Luanne Bras, MD;  Location: [place];  Service: Radiology;  Laterality: N/A;   ROTATOR CUFF REPAIR  2002   TEE WITHOUT CARDIOVERSION N/A 09/29/2020   Procedure: TRANSESOPHAGEAL ECHOCARDIOGRAM (TEE);  Surgeon: Teodoro Spray, MD;  Location: [facility];  Service: Cardiovascular;  Laterality: N/A;   TONSILLECTOMY  1956   TONSILLECTOMY     TOTAL ABDOMINAL HYSTERECTOMY W/ BILATERAL SALPINGOOPHORECTOMY  1984   VEIN LIGATION AND STRIPPING      Home Medications:  Allergies as of 08/03/2022       Reactions   Barium Iodide    Unknown   Lipitor [atorvastatin] Other (See Comments)   Myalgias   Statins Other (See Comments)   Myalgias   Crestor [rosuvastatin Calcium] Other (See Comments)   Myalgias   Penicillins Rash   Sulfa Antibiotics Rash   Sulfonamide Derivatives Rash        Medication List        Accurate as of August 03, 2022 10:32 AM. If  you have any questions, ask your nurse or doctor.          apixaban 5 MG Tabs tablet Commonly known as: ELIQUIS Take 1 tablet (5 mg total) by mouth 2 (two) times daily.   B-complex with vitamin C tablet Take 1 tablet by mouth daily.   bacitracin-polymyxin b ophthalmic ointment Commonly known as: POLYSPORIN Place into both eyes 4 (four) times daily. apply to eye every 12 hours while awake, for 2 more days   docusate sodium 100 MG capsule Commonly known as: COLACE Take 1 capsule (100 mg total) by mouth 2 (two) times daily.   estradiol 0.1 MG/GM vaginal cream Commonly known as: ESTRACE Place 1 Applicatorful vaginally 3 (three) times a week. Apply  pea size amount per urethral meatus on Monday, Wednesday, Friday Start taking on: August 04, 2022   ezetimibe 10 MG tablet Commonly known as: ZETIA Take 1 tablet (10 mg total) by mouth daily.   FLUoxetine 20 MG capsule Commonly known as: PROZAC Take 1 capsule (20 mg total) by mouth at bedtime. What changed: when to take this   levETIRAcetam 500 MG tablet Commonly known as: KEPPRA Take 1 tablet (500 mg total) by mouth 2 (two) times daily.   loratadine 10 MG tablet Commonly known as: CLARITIN Take 1 tablet (10 mg total) by mouth daily.   losartan 25 MG tablet Commonly known as: Cozaar Take 1 tablet (25 mg total) by mouth daily.   magnesium gluconate 500 MG tablet Commonly known as: MAGONATE Take 0.5 tablets (250 mg total) by mouth at bedtime.   melatonin 3 MG Tabs tablet Take 1 tablet (3 mg total) by mouth at bedtime.   metFORMIN 500 MG tablet Commonly known as: GLUCOPHAGE Take 1 tablet (500 mg total) by mouth 2 (two) times daily with a meal.   pantoprazole 40 MG tablet Commonly known as: PROTONIX Take 1 tablet (40 mg total) by mouth daily at 12 noon. What changed: when to take this   potassium chloride 20 MEQ packet Commonly known as: KLOR-CON Take 20 mEq by mouth daily.   torsemide 20 MG tablet Commonly  known as: DEMADEX Take 20 mg by mouth daily.   vitamin D3 25 MCG (1000 UT) tablet Generic drug: Cholecalciferol Take 1 tablet (1,000 Units total) by mouth daily.        Allergies:  Allergies  Allergen Reactions   Barium Iodide     Unknown   Lipitor [Atorvastatin] Other (See Comments)    Myalgias    Statins Other (See Comments)    Myalgias    Crestor [Rosuvastatin Calcium] Other (See Comments)    Myalgias    Penicillins Rash   Sulfa Antibiotics Rash   Sulfonamide Derivatives Rash    Family History: Family History  Problem Relation Age of Onset   Heart disease Brother    Stroke Mother    Skin cancer Father    Breast cancer Paternal Grandmother     Social History:  reports that she has never smoked. She has never used smokeless tobacco. She reports that she does not drink alcohol and does not use drugs.   Physical Exam: BP 122/73   Pulse 82   Ht 5' 3"$  (1.6 m)   Wt 164 lb (74.4 kg)   BMI 29.05 kg/m   Constitutional:  Alert and oriented, No acute distress.  Elderly, frail, in wheelchair on oxygen. HEENT: [place] AT, moist mucus membranes.  Trachea midline, no masses. Neurologic: Grossly intact, no focal deficits, moving all 4 extremities. Psychiatric: Normal mood and affect.  Assessment & Plan:    1. Urinary retention - Suspect acute on chronic - She is emptying somewhat better and starting to void more spontaneously. Hopefully, this will improve with mobilization. -  Causes are likely multifactorial, including possibly related to her previous stroke, immobility, medical illness, etc.  - We discussed risks and benefits of trial of medications such as Flomex, but worry about orthostasis contributing to possible falls. Benefit is quite low. We will defer this.  - Continue to manage her with self-cath while she is at [facility] and they are able to do so.  -I have asked them to continue the current regimen, record volumes for few days and then decide if we  need to  adjust the frequency -Once she transitions back into assisted living, we will discuss options for continued bladder management at that time, which could include chronic indwelling Foley versus self-cath versus SP tube.  -Her daughter is a Marine scientist and is aware of all these options, risks, and benefits, including the infectious risk of each.   2. Recurrent UTI - Daughter is concerned that she may have a UTI as she has not been quite as spunky.  -We did cath her today for a urine specimen, UA, and culture and we will have to think critically about whether or not to treat this given the previous resistance pattern and fairly nonspecific symptoms. She is also high risk for chronic bacteruria -Cranberry, daily probiotics, and D-mannose added to her regimen as well as resumption of topical estrogen cream 3 times a week and ordered for this facility to resume.   Return if symptoms worsen or fail to improve.  I have reviewed the above documentation for accuracy and completeness, and I agree with the above.   Hollice Espy, MD   [facility] [address] [place], [place] [address] [phone]  I spent 55 total minutes on the day of the encounter including pre-visit review of the medical record, face-to-face time with the patient, and post visit ordering of labs/imaging/tests.

## 2022-08-04 LAB — URINALYSIS, COMPLETE
Bilirubin, UA: NEGATIVE
Glucose, UA: NEGATIVE
Ketones, UA: NEGATIVE
Nitrite, UA: POSITIVE — AB
Specific Gravity, UA: 1.015 (ref 1.005–1.030)
Urobilinogen, Ur: 0.2 mg/dL (ref 0.2–1.0)
pH, UA: 6 (ref 5.0–7.5)

## 2022-08-04 LAB — MICROSCOPIC EXAMINATION: WBC, UA: >30 /hpf — AB (ref 0–5)

## 2022-08-11 LAB — CULTURE, URINE COMPREHENSIVE

## 2022-09-01 ENCOUNTER — Telehealth: Payer: Self-pay

## 2022-09-01 NOTE — Telephone Encounter (Addendum)
  Spoke with Almyra Free, RN from [facility] at [place] [phone]) regarding called of pt gaining 6 Lbs last night.  Recommendations from Lysle Morales to  take 1 extra torsemide (20mg  tab) and 1 extra potassium (20 mEq tablet) today.  RN aware, agreeable, and verbalized understanding.

## 2022-10-03 ENCOUNTER — Non-Acute Institutional Stay: Payer: Medicare Other | Admitting: Nurse Practitioner

## 2022-10-03 DIAGNOSIS — Z515 Encounter for palliative care: Secondary | ICD-10-CM

## 2022-10-03 DIAGNOSIS — R0602 Shortness of breath: Secondary | ICD-10-CM

## 2022-10-03 DIAGNOSIS — I428 Other cardiomyopathies: Secondary | ICD-10-CM

## 2022-10-03 DIAGNOSIS — I272 Pulmonary hypertension, unspecified: Secondary | ICD-10-CM

## 2022-10-03 DIAGNOSIS — B309 Viral conjunctivitis, unspecified: Secondary | ICD-10-CM

## 2022-10-03 NOTE — Progress Notes (Signed)
Therapist, nutritional Palliative Care Consult Note Telephone: [phone]  Fax: [phone]   Date of encounter: 10/03/22 11:57 AM PATIENT NAME: Linda [name] [address] [place] [address]   [phone] (home)  DOB: [birth year] MRN: [id number] PRIMARY CARE PROVIDER:    Lauro Regulus, MD,  [facility]  RESPONSIBLE PARTY:    Contact Information     Name Relation Home Work Mobile   Linda [name] [phone]  [phone]   Linda [name]   [phone]      I met face to face with patient in facility. Palliative Care was asked to follow this patient by consultation request of  Linda Regulus, MD Linda [name] [facility] to address advance care planning and complex medical decision making. This is the initial visit.                      ASSESSMENT AND PLAN / RECOMMENDATIONS:  Symptom Management/Plan: Symptom Management/Plan: 1. Advance Care Planning;  Ongoing discussions, waiting to clarify goc with family 2. Goals of Care: Goals include to maximize quality of life and symptom management. Our advance care planning conversation included a discussion about:    The value and importance of advance care planning  Exploration of personal, cultural or spiritual beliefs that might influence medical decisions  Exploration of goals of care in the event of a sudden injury or illness  Identification and preparation of a healthcare agent  Review and updating or creation of an advance directive document. 3. Palliative care encounter; Palliative care encounter; Palliative medicine team will continue to support patient, patient's family, and medical team. Visit consisted of counseling and education dealing with the complex and emotionally intense issues of symptom management and palliative care in the setting of serious and potentially life-threatening illness  4. Shortness of breath with edema/vulvular cardiomyopathy;  pulmonary htn; on O2 continuously. Continue to monitor respiratory status, inhalation therapy, encourage mobility, pulmonary toileting. Will continue to monitor/follow with pc until further discuss with [name]/son goc.  08/21/2022 weight 162.5 lbs 09/18/2022 weight 172.6 lbs EDEMA  Follow up Palliative Care Visit: Palliative care will continue to follow for complex medical decision making, advance care planning, and clarification of goals. Return 2 to 8 weeks or prn.  I spent 55 minutes providing this consultation. More than 50% of the time in this consultation was spent in counseling and care coordination.  PPS: 50%  Chief Complaint: Initial palliative consult for complex medical decision making, address goals, manage ongoing symptoms  HISTORY OF PRESENT ILLNESS:  Linda [name] is a 89 y.o. year old female  with multiple medical problems including pulmonary htn, valvular cardiomyopathy, CHF, Afib, ischemic left MCA stroke, h/o seizures, varicose veins BLE, CAD, h/o gastritis, GER, DM, h/o basal cell carcinoma right lower extremity, HLD, anxiety, insomnia. Linda [name] does reside LTC at [facility]. She is ambulatory in her room, though appears short distances she becomes short of breath. Staff endorses she does require assistance for adl's, does feed herself with varied appetite. Linda [name] has had recent weight gain secondary to edema. At present Linda [name] is sleeping in her room, awoke to verbal cues, makes eye contact. Linda [name] is able to answer questions though it does take some time for her to process. We talked about purpose of pc visit, ros, functional ability, specifically sob, edema. We talked about social/family history, past medical history though limited with appears mild cognitive impairment, some sob with discussion.  Improves with rest. I have attempted to contact Linda [name], unable to reach, unable to leave a message. Support provided to Linda [place]. Educated on shortness of breath, O2,  will continue to follow for further discussions of goc, clarify code status and possible hospice if family in agreement of symptomatic decline in the setting of aging and chronic disease. Updated staff. Medications, records, poc, goc currently in place reviewed.   10/04/2022 I attempted to contact [name], phone rings without answer or ability to leave a message. Notified Linda [name] SW at [facility], will look for another number.   History obtained from review of EMR, discussion with primary team, and interview with family, facility staff/caregiver and/or Linda [name].  I reviewed available labs, medications, imaging, studies and related documents from the EMR.  Records reviewed and summarized above.   Physical Exam: General: frail appearing, elderly pleasant female ENMT: oral mucous membranes moist CV: S1S2, RRR, +BLE edema Pulmonary: Decreased, fine crackles bases Abdomen: normo-active BS + 4 quadrants, soft and non tender MSK: ambulatory Skin: warm and dry Neuro:  + generalized weakness Psych: non-anxious affect, A and O x 2 CURRENT PROBLEM LIST:  Patient Active Problem List   Diagnosis Date Noted   Bilateral lower extremity edema 07/25/2022   Cellulitis of right lower extremity 07/25/2022   CHF (congestive heart failure) 07/20/2022   Seizures 06/22/2022   Left middle cerebral artery stroke 02/13/2022   Acute ischemic left MCA stroke 02/08/2022   Status post stroke 02/08/2022   Middle cerebral artery embolism, left 02/08/2022   Endotracheal tube present    Chronic HFrEF (heart failure with reduced ejection fraction)    NSVT (nonsustained ventricular tachycardia) 02/06/2022   Hypoxia 02/05/2022   Chronic anticoagulation 02/05/2022   HFrEF (heart failure with reduced ejection fraction) 02/05/2022   Pulmonary hypertension 02/05/2022   Valvular cardiomyopathy 10/22/2021   S/P mitral valve clip implantation 10/05/2021   Varicose veins of both lower extremities 07/13/2021   Injury of right leg  07/06/2021   Right leg pain 07/06/2021   Callus of foot 07/06/2021   Proximal muscle weakness 08/24/2020   Severe mitral regurgitation s/p MitraClip June 2022 07/09/2020   Fatigue 05/29/2020   Diuretic-induced hypokalemia 12/16/2019   Myalgia due to statin 12/15/2019   Acquired thrombophilia 12/15/2019   Skin lesion 08/27/2018   Hospital discharge follow-up 05/25/2018   Abnormal chest x-ray 05/25/2018   History of CVA (cerebrovascular accident) without residual deficits 05/10/2018   Parent-child estrangement nec 02/09/2018   Basal cell carcinoma (BCC) of right lower extremity 08/08/2017   Hematuria, gross 08/08/2017   Extremity atherosclerosis with intermittent claudication 08/08/2017   Generalized anxiety disorder 01/31/2017   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   Controlled type 2 diabetes mellitus with microalbuminuria, without long-term current use of insulin 01/20/2016   Insomnia secondary to anxiety 10/09/2015   Polycythemia, secondary 07/04/2015   Pre-syncope 07/02/2015   Osteopenia 08/21/2014   Gastritis 05/26/2014   Epistaxis 03/18/2014   GERD (gastroesophageal reflux disease) 03/04/2014   Medicare annual wellness visit, subsequent 01/13/2014   Essential hypertension 11/04/2013   Screening for breast cancer 07/01/2013   Left shoulder pain 03/21/2013   Hoarseness of voice 03/21/2013   Edema 09/05/2010   VENTRICULAR HYPERTROPHY, LEFT 08/16/2010   Hyperlipidemia 06/03/2010   ATRIAL FIBRILLATION 08/13/2009   PAST MEDICAL HISTORY:  Active Ambulatory Problems    Diagnosis Date Noted   ATRIAL FIBRILLATION 08/13/2009   Hyperlipidemia 06/03/2010   VENTRICULAR HYPERTROPHY, LEFT 08/16/2010   Edema 09/05/2010   Left  shoulder pain 03/21/2013   Hoarseness of voice 03/21/2013   Screening for breast cancer 07/01/2013   Essential hypertension 11/04/2013   Medicare annual wellness visit, subsequent 01/13/2014   GERD (gastroesophageal reflux disease) 03/04/2014   Epistaxis  03/18/2014   Gastritis 05/26/2014   Osteopenia 08/21/2014   Pre-syncope 07/02/2015   Polycythemia, secondary 07/04/2015   Insomnia secondary to anxiety 10/09/2015   Controlled type 2 diabetes mellitus with microalbuminuria, without long-term current use of insulin 01/20/2016   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   Generalized anxiety disorder 01/31/2017   Basal cell carcinoma (BCC) of right lower extremity 08/08/2017   Hematuria, gross 08/08/2017   Extremity atherosclerosis with intermittent claudication 08/08/2017   Parent-child estrangement nec 02/09/2018   History of CVA (cerebrovascular accident) without residual deficits 05/10/2018   Hospital discharge follow-up 05/25/2018   Abnormal chest x-ray 05/25/2018   Skin lesion 08/27/2018   Myalgia due to statin 12/15/2019   Acquired thrombophilia 12/15/2019   Diuretic-induced hypokalemia 12/16/2019   Fatigue 05/29/2020   Proximal muscle weakness 08/24/2020   Severe mitral regurgitation s/p MitraClip June 2022 07/09/2020   Injury of right leg 07/06/2021   Right leg pain 07/06/2021   Callus of foot 07/06/2021   Varicose veins of both lower extremities 07/13/2021   Valvular cardiomyopathy 10/22/2021   Hypoxia 02/05/2022   Chronic anticoagulation 02/05/2022   S/P mitral valve clip implantation 10/05/2021   HFrEF (heart failure with reduced ejection fraction) 02/05/2022   Pulmonary hypertension 02/05/2022   NSVT (nonsustained ventricular tachycardia) 02/06/2022   Acute ischemic left MCA stroke 02/08/2022   Status post stroke 02/08/2022   Middle cerebral artery embolism, left 02/08/2022   Endotracheal tube present    Chronic HFrEF (heart failure with reduced ejection fraction)    Left middle cerebral artery stroke 02/13/2022   Seizures 06/22/2022   CHF (congestive heart failure) 07/20/2022   Bilateral lower extremity edema 07/25/2022   Cellulitis of right lower extremity 07/25/2022   Resolved Ambulatory Problems    Diagnosis Date  Noted   PREMATURE VENTRICULAR CONTRACTIONS 08/13/2009   Encounter for anticoagulation discussion and counseling 09/28/2010   Diverticulitis of colon without hemorrhage 07/01/2013   Encounter for therapeutic drug monitoring 07/16/2013   Grief 10/29/2013   Tracheobronchitis 05/26/2014   Oral thrush 08/13/2014   Dysuria 12/28/2014   Statin intolerance 01/21/2015   Mass of right side of neck 07/02/2015   UTI (lower urinary tract infection) 11/17/2015   Oral ulceration 04/30/2016   Microalbuminuria due to type 2 diabetes mellitus 04/30/2016   Depression, recurrent 08/14/2016   Light headedness 09/26/2016   Hematuria, undiagnosed cause 05/20/2017   Mass of middle lobe of right lung 05/03/2018   Cardiomyopathy, idiopathic 09/03/2015   Past Medical History:  Diagnosis Date   Anxiety    Arrhythmia    Colon polyp    Diverticulitis    Dysrhythmia    Hard of hearing    Hoarseness    Hypertension    Kidney stone    Motion sickness    Parathyroid disease (HCC)    PONV (postoperative nausea and vomiting)    Pre-diabetes    Pulmonary HTN (HCC)    PVC (premature ventricular contraction)    Skin cancer    Squamous cell carcinoma of skin 07/19/2021   Squamous cell carcinoma of skin 10/13/2014   Squamous cell carcinoma of skin 09/15/2021   Stroke (HCC)    TIA (transient ischemic attack) 04/2018   Wears hearing aid in both ears    SOCIAL HX:  Social History   Tobacco Use   Smoking status: Never   Smokeless tobacco: Never  Substance Use Topics   Alcohol use: No   FAMILY HX:  Family History  Problem Relation Age of Onset   Heart disease Brother    Stroke Mother    Skin cancer Father    Breast cancer Paternal Grandmother       ALLERGIES:  Allergies  Allergen Reactions   Barium Iodide     Unknown   Lipitor [Atorvastatin] Other (See Comments)    Myalgias    Statins Other (See Comments)    Myalgias    Crestor [Rosuvastatin Calcium] Other (See Comments)    Myalgias     Penicillins Rash   Sulfa Antibiotics Rash   Sulfonamide Derivatives Rash     PERTINENT MEDICATIONS:  Outpatient Encounter Medications as of 10/03/2022  Medication Sig   apixaban (ELIQUIS) 5 MG TABS tablet Take 1 tablet (5 mg total) by mouth 2 (two) times daily.   B Complex-C (B-COMPLEX WITH VITAMIN C) tablet Take 1 tablet by mouth daily.   bacitracin-polymyxin b (POLYSPORIN) ophthalmic ointment Place into both eyes 4 (four) times daily. apply to eye every 12 hours while awake, for 2 more days   cholecalciferol (CHOLECALCIFEROL) 25 MCG tablet Take 1 tablet (1,000 Units total) by mouth daily.   docusate sodium (COLACE) 100 MG capsule Take 1 capsule (100 mg total) by mouth 2 (two) times daily.   estradiol (ESTRACE) 0.1 MG/GM vaginal cream Place 1 Applicatorful vaginally 3 (three) times a week. Apply pea size amount per urethral meatus on Monday, Wednesday, Friday   ezetimibe (ZETIA) 10 MG tablet Take 1 tablet (10 mg total) by mouth daily.   FLUoxetine (PROZAC) 20 MG capsule Take 1 capsule (20 mg total) by mouth at bedtime. (Patient taking differently: Take 20 mg by mouth daily.)   levETIRAcetam (KEPPRA) 500 MG tablet Take 1 tablet (500 mg total) by mouth 2 (two) times daily.   loratadine (CLARITIN) 10 MG tablet Take 1 tablet (10 mg total) by mouth daily.   losartan (COZAAR) 25 MG tablet Take 1 tablet (25 mg total) by mouth daily.   magnesium gluconate (MAGONATE) 500 MG tablet Take 0.5 tablets (250 mg total) by mouth at bedtime.   melatonin 3 MG TABS tablet Take 1 tablet (3 mg total) by mouth at bedtime.   metFORMIN (GLUCOPHAGE) 500 MG tablet Take 1 tablet (500 mg total) by mouth 2 (two) times daily with a meal.   pantoprazole (PROTONIX) 40 MG tablet Take 1 tablet (40 mg total) by mouth daily at 12 noon. (Patient taking differently: Take 40 mg by mouth daily.)   potassium chloride (KLOR-CON) 20 MEQ packet Take 20 mEq by mouth daily.   torsemide (DEMADEX) 20 MG tablet Take 20 mg by mouth daily.    No facility-administered encounter medications on file as of 10/03/2022.   Thank you for the opportunity to participate in the care of Linda. [name].  The palliative care team will continue to follow. Please call our office at [phone] if we can be of additional assistance.   Aretta Stetzel Z Imara Standiford, NP ,

## 2022-10-05 ENCOUNTER — Encounter: Payer: Self-pay | Admitting: Nurse Practitioner

## 2022-10-05 ENCOUNTER — Non-Acute Institutional Stay: Payer: Medicare Other | Admitting: Nurse Practitioner

## 2022-10-05 DIAGNOSIS — R0602 Shortness of breath: Secondary | ICD-10-CM

## 2022-10-05 DIAGNOSIS — I272 Pulmonary hypertension, unspecified: Secondary | ICD-10-CM

## 2022-10-05 DIAGNOSIS — Z515 Encounter for palliative care: Secondary | ICD-10-CM

## 2022-10-05 DIAGNOSIS — I428 Other cardiomyopathies: Secondary | ICD-10-CM

## 2022-10-05 NOTE — Progress Notes (Signed)
Therapist, nutritional Palliative Care Consult Note Telephone: [phone]  Fax: [phone]    Date of encounter: 10/05/22 2:47 PM PATIENT NAME: Linda [name] [address] [place] [address]   [phone] (home)  DOB: March 25, 1934 MRN: [id number] PRIMARY CARE PROVIDER:    Lauro Regulus, MD,  [facility]  RESPONSIBLE PARTY:    Contact Information     Name Relation Home Work Mobile   Linda [name] Daughter [phone]  [phone]   Linda [name]   [phone]     I met face to face with patient in facility. Palliative Care was asked to follow this patient by consultation request of  Linda Regulus, MD Linda [name] [facility] to address advance care planning and complex medical decision making. This is the initial visit.                      ASSESSMENT AND PLAN / RECOMMENDATIONS:  Symptom Management/Plan: Symptom Management/Plan: 1. Advance Care Planning; DNR;  Family meeting with Linda [name] son and Linda [name] dtg, reviewed goc, quality of life, overall functional decline. Wishes are to proceed with hospice evaluation. Notified staff and Dr Linda [name] 2. Goals of Care: Goals include to maximize quality of life and symptom management. Our advance care planning conversation included a discussion about:    The value and importance of advance care planning  Exploration of personal, cultural or spiritual beliefs that might influence medical decisions  Exploration of goals of care in the event of a sudden injury or illness  Identification and preparation of a healthcare agent  Review and updating or creation of an advance directive document. 3. Palliative care encounter; Palliative care encounter; Palliative medicine team will continue to support patient, patient's family, and medical team. Visit consisted of counseling and education dealing with the complex and emotionally intense issues of symptom management and  palliative care in the setting of serious and potentially life-threatening illness   4. Shortness of breath with edema/vulvular cardiomyopathy; pulmonary htn; on O2 continuously. Continue to monitor respiratory status, inhalation therapy, encourage mobility, pulmonary toileting. Will continue to monitor/follow with pc until further discuss with daughter/son goc.   08/21/2022 weight 162.5 lbs 09/18/2022 weight 172.6 lbs EDEMA I spent 45 minutes providing this consultation. More than 50% of the time in this consultation was spent in counseling and care coordination.   PPS: 50%   Chief Complaint: Initial palliative consult for complex medical decision making, address goals, manage ongoing symptoms   HISTORY OF PRESENT ILLNESS:  Linda [name] is a 89 y.o. year old female  with multiple medical problems including pulmonary htn, valvular cardiomyopathy, CHF, Afib, ischemic left MCA stroke, h/o seizures, varicose veins BLE, CAD, h/o gastritis, GER, DM, h/o basal cell carcinoma right lower extremity, HLD, anxiety, insomnia. Linda [name] does reside LTC at [facility]. She is ambulatory in her room, though appears short distances she becomes short of breath. Staff endorses she does require assistance for adl's, does feed herself with varied appetite. Linda [name] has had recent weight gain secondary to edema. At present Linda [name] is sleeping in her room, awoke to verbal cues, makes eye contact. Appears comfortable, very difficult to understand speech, continuous O2.  We talked about ros, functional debility limited and appetite. Linda [name] and [place] son in room. Support provided, Linda [name] was cooperative with assessment, today she seemed more confused. I met with Linda [name] separately from Linda [name]. Clinical update discussed, talked about goc, chronic  disease with overall decline, debility. Re-visited hospice services under Medicare benefit. We talked about what services are provided. We talked about benefits  of hospice, Linda [name] and Linda [name] both agree with ongoing decline and wishes are to proceed with hospice evaluation, updated staff and Dr Linda [name], order sent. Support provided.    History obtained from review of EMR, discussion with primary team, and interview with family, facility staff/caregiver and/or Linda [name].  I reviewed available labs, medications, imaging, studies and related documents from the EMR.  Records reviewed and summarized above.    Physical Exam: General: frail appearing, elderly pleasant female ENMT: oral mucous membranes moist CV: S1S2, RRR, +BLE edema Pulmonary: Decreased, fine crackles bases Skin: warm and dry Neuro:  + generalized weakness Psych: non-anxious affect, A and O x 2 Thank you for the opportunity to participate in the care of Linda [name]. Please call our office at [phone] if we can be of additional assistance.   Linda Goodell Prince [place], NP

## 2023-06-20 DEATH — deceased

## 2023-08-25 IMAGING — DX DG TIBIA/FIBULA 2V*R*
2 series · 2 of 2 positions shown · non-contrast
Comparison: None.

CLINICAL DATA: Right leg pain. Injury of right lower extremity,
initial encounter. Hit lower leg with Tomas Pedro.

EXAM:
RIGHT TIBIA AND FIBULA - 2 VIEW

[lower leg ap]
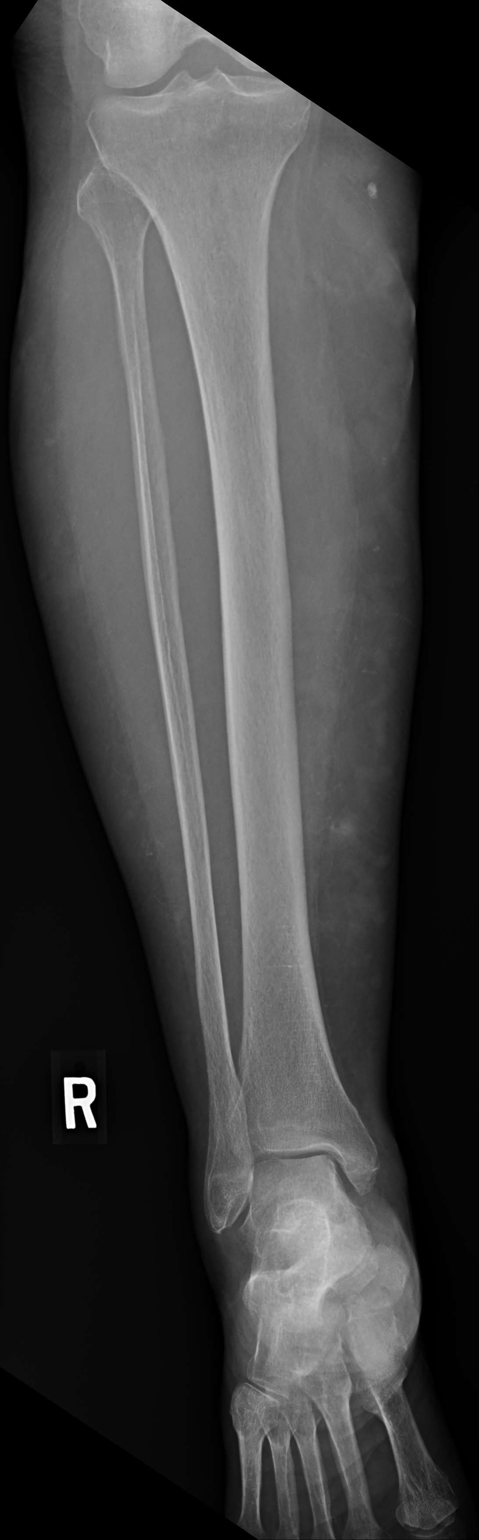

[lower leg lat]
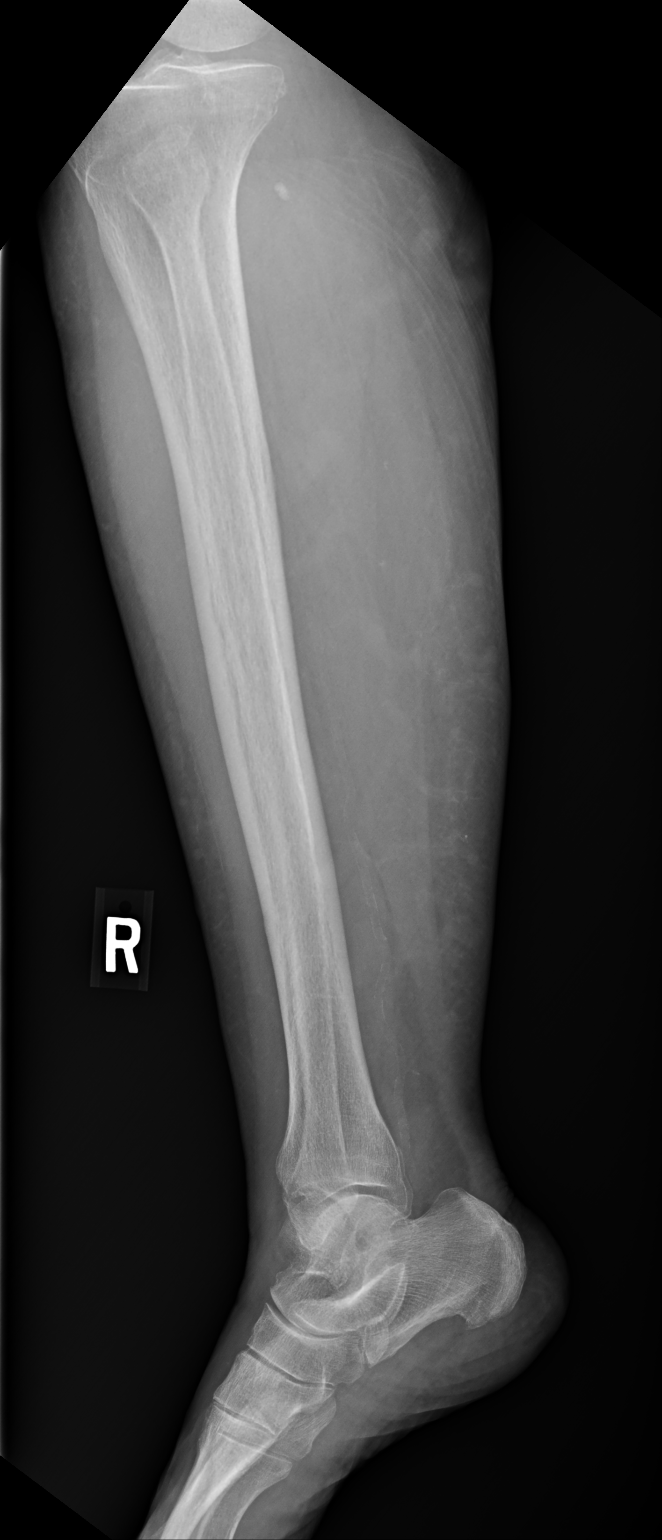

[2 of 2 positions shown; findings below may reference images not displayed]

FINDINGS: The proximal aspect of the tibia is excluded from the field of view
on both frontal and lateral views. No evidence of fracture. The
cortical margins of the tibia and fibula are intact were visualized.
Knee and ankle alignment are grossly maintained. No erosion or
periosteal reaction. Insert patchiness soft tissue densities in the
medial subcutaneous tissues may be related to varicosities, soft
tissue edema is also possible.
IMPRESSION: 1. No fracture of the right lower leg.
2. Medial soft tissue densities may be related to varicosities, soft
tissue edema, or combination thereof.

## 2023-08-30 IMAGING — US US EXTREM LOW VENOUS*R*
1 series · 13 of 24 positions shown · non-contrast
Comparison: None.

CLINICAL DATA: Swelling and pain of right lower leg.  Injury.

EXAM:
RIGHT LOWER EXTREMITY VENOUS DOPPLER ULTRASOUND
TECHNIQUE: Gray-scale sonography with compression, as well as color and duplex
ultrasound, were performed to evaluate the deep venous system(s)
from the level of the common femoral vein through the popliteal and
proximal calf veins.

[Series 1: us extrem low venous*right* · 0.07mm/px · 13 of 33 slices shown]
[im 1/33]
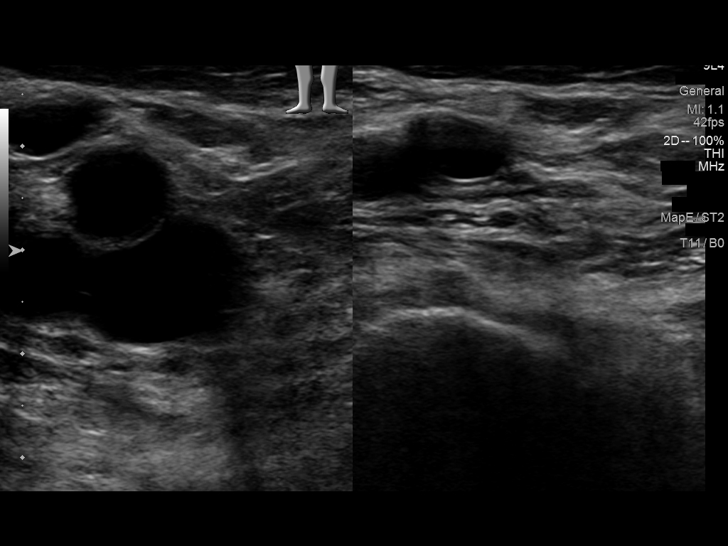
[im 3/33]
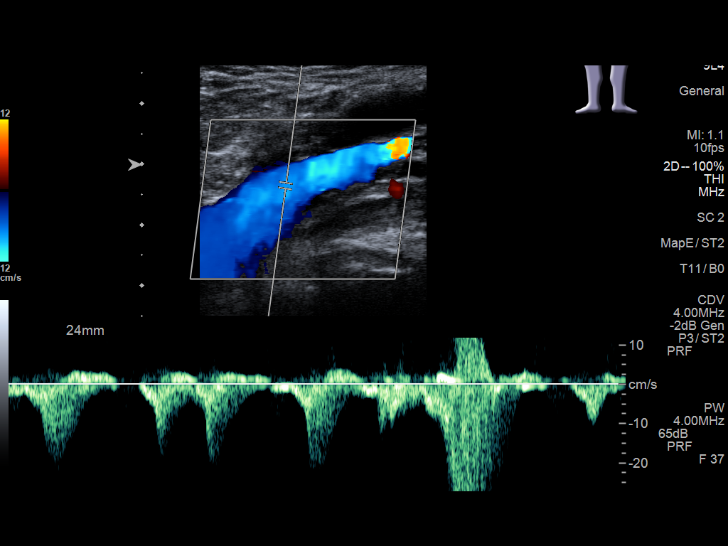
[im 6/33]
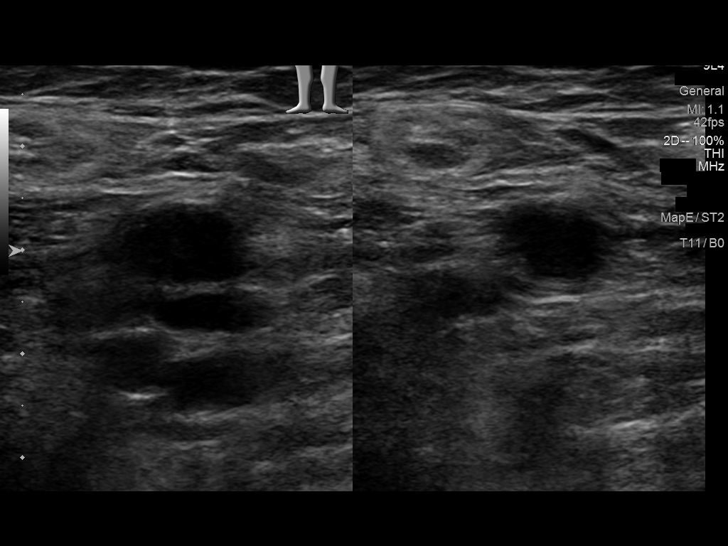
[im 9/33]
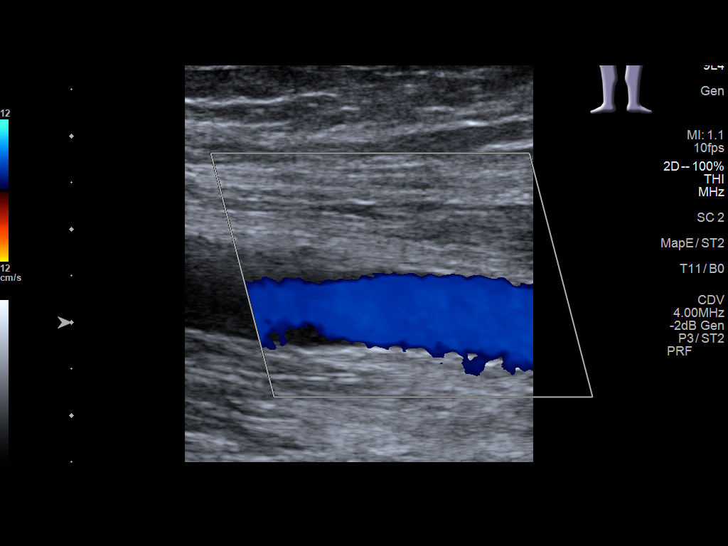
[im 12/33]
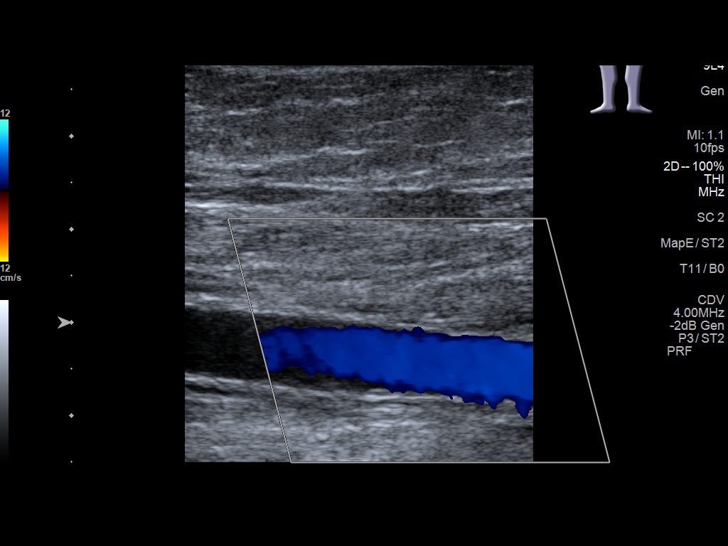
[im 14/33]
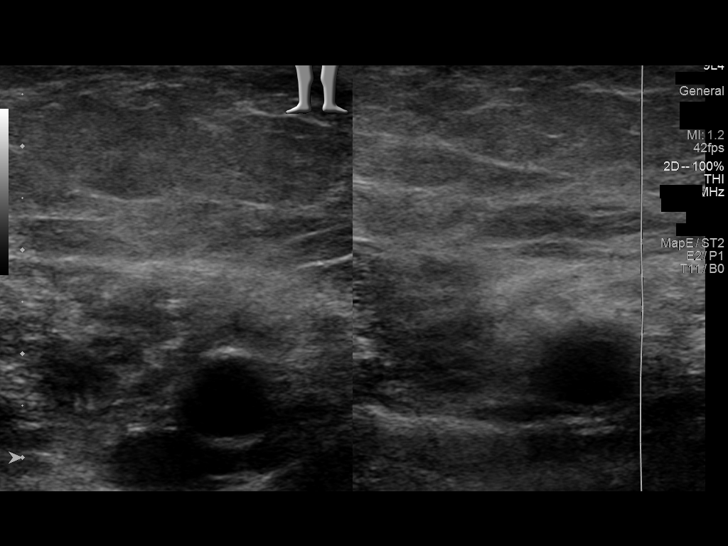
[im 17/33]
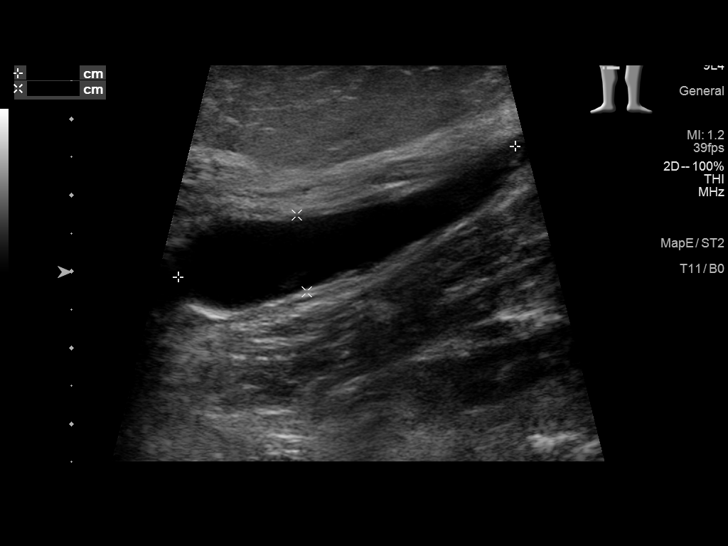
[im 19/33]
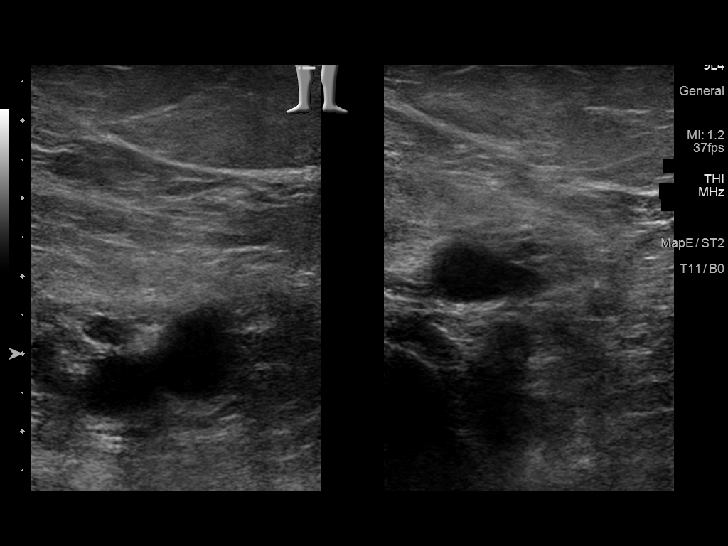
[im 21/33]
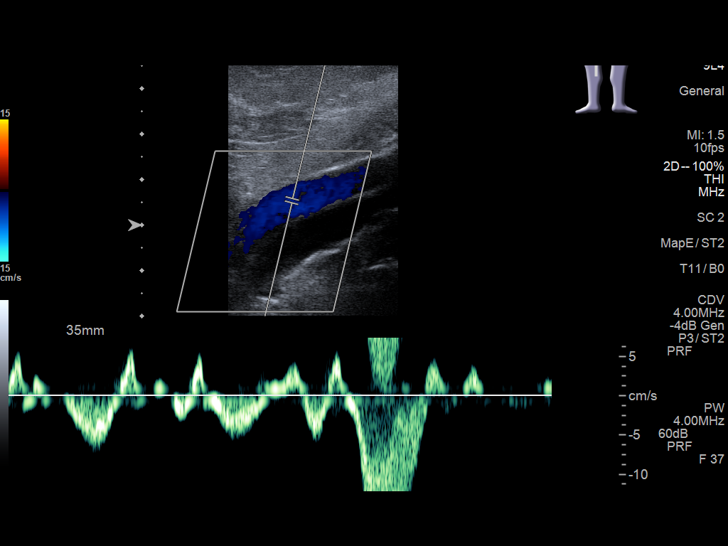
[im 24/33]
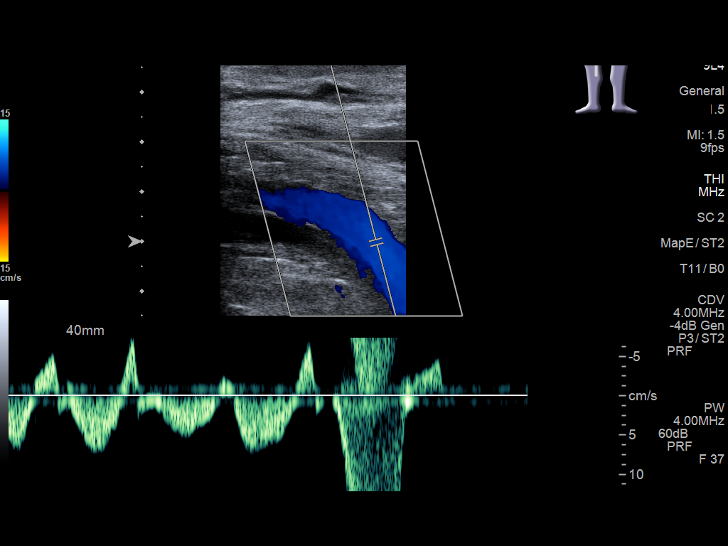
[im 27/33]
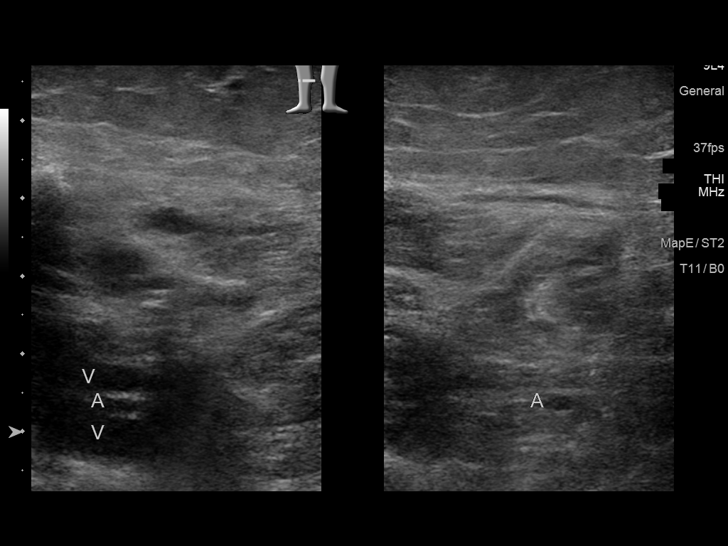
[im 30/33]
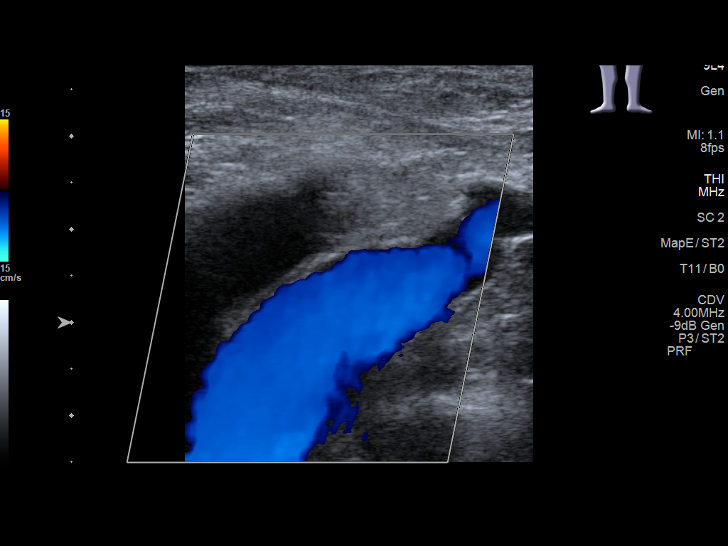
[im 33/33]
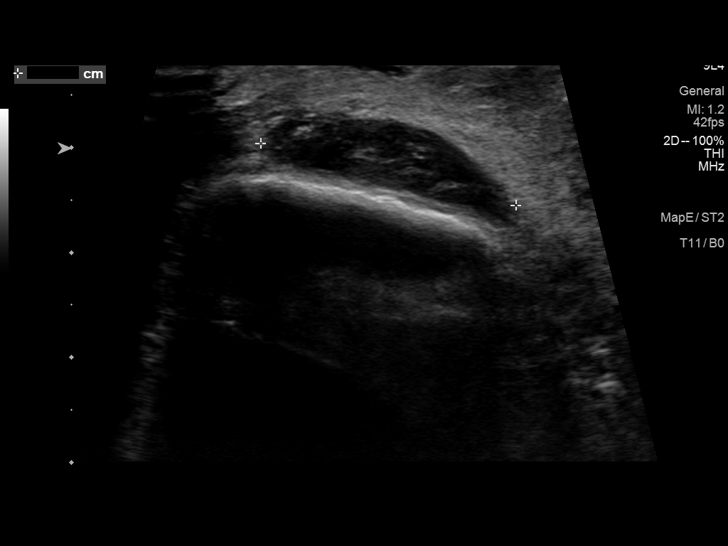

[13 of 24 positions shown; findings below may reference images not displayed]

FINDINGS: VENOUS

Normal compressibility of the common femoral, superficial femoral,
and popliteal veins, as well as the visualized calf veins.
Visualized portions of profunda femoral vein and great saphenous
vein unremarkable. No filling defects to suggest DVT on grayscale or
color Doppler imaging. Doppler waveforms show normal direction of
venous flow, normal respiratory plasticity and response to
augmentation.

Limited views of the contralateral common femoral vein are
unremarkable.

OTHER

A simple fluid collection is seen within the right popliteal fossa
measuring 4.7 x 1.0 x 2.2 cm.

Additional more heterogeneous avascular fluid collection seen within
the anterior right lower leg in the area of the patient's injury
measuring 5.1 x 0.8 x 2.5 cm.

Limitations: none
IMPRESSION: :
IMPRESSION: 1. No deep venous thrombosis is visualized.
2. Probable Baker's cyst measuring up to 4.7 cm.
3. In the area of the patient's anterior right lower leg injury,
there is more heterogeneous fluid collection, presumably a
combination of soft tissue swelling, edema, and/or hematoma.

## 2023-09-01 IMAGING — DX DG CHEST 2V
2 series · 2 of 2 positions shown · non-contrast
Comparison: May 02, 2018

CLINICAL DATA: Shortness of breath since starting on clindamycin on
July 06, 2021.

EXAM:
CHEST - 2 VIEW

[chest pa]
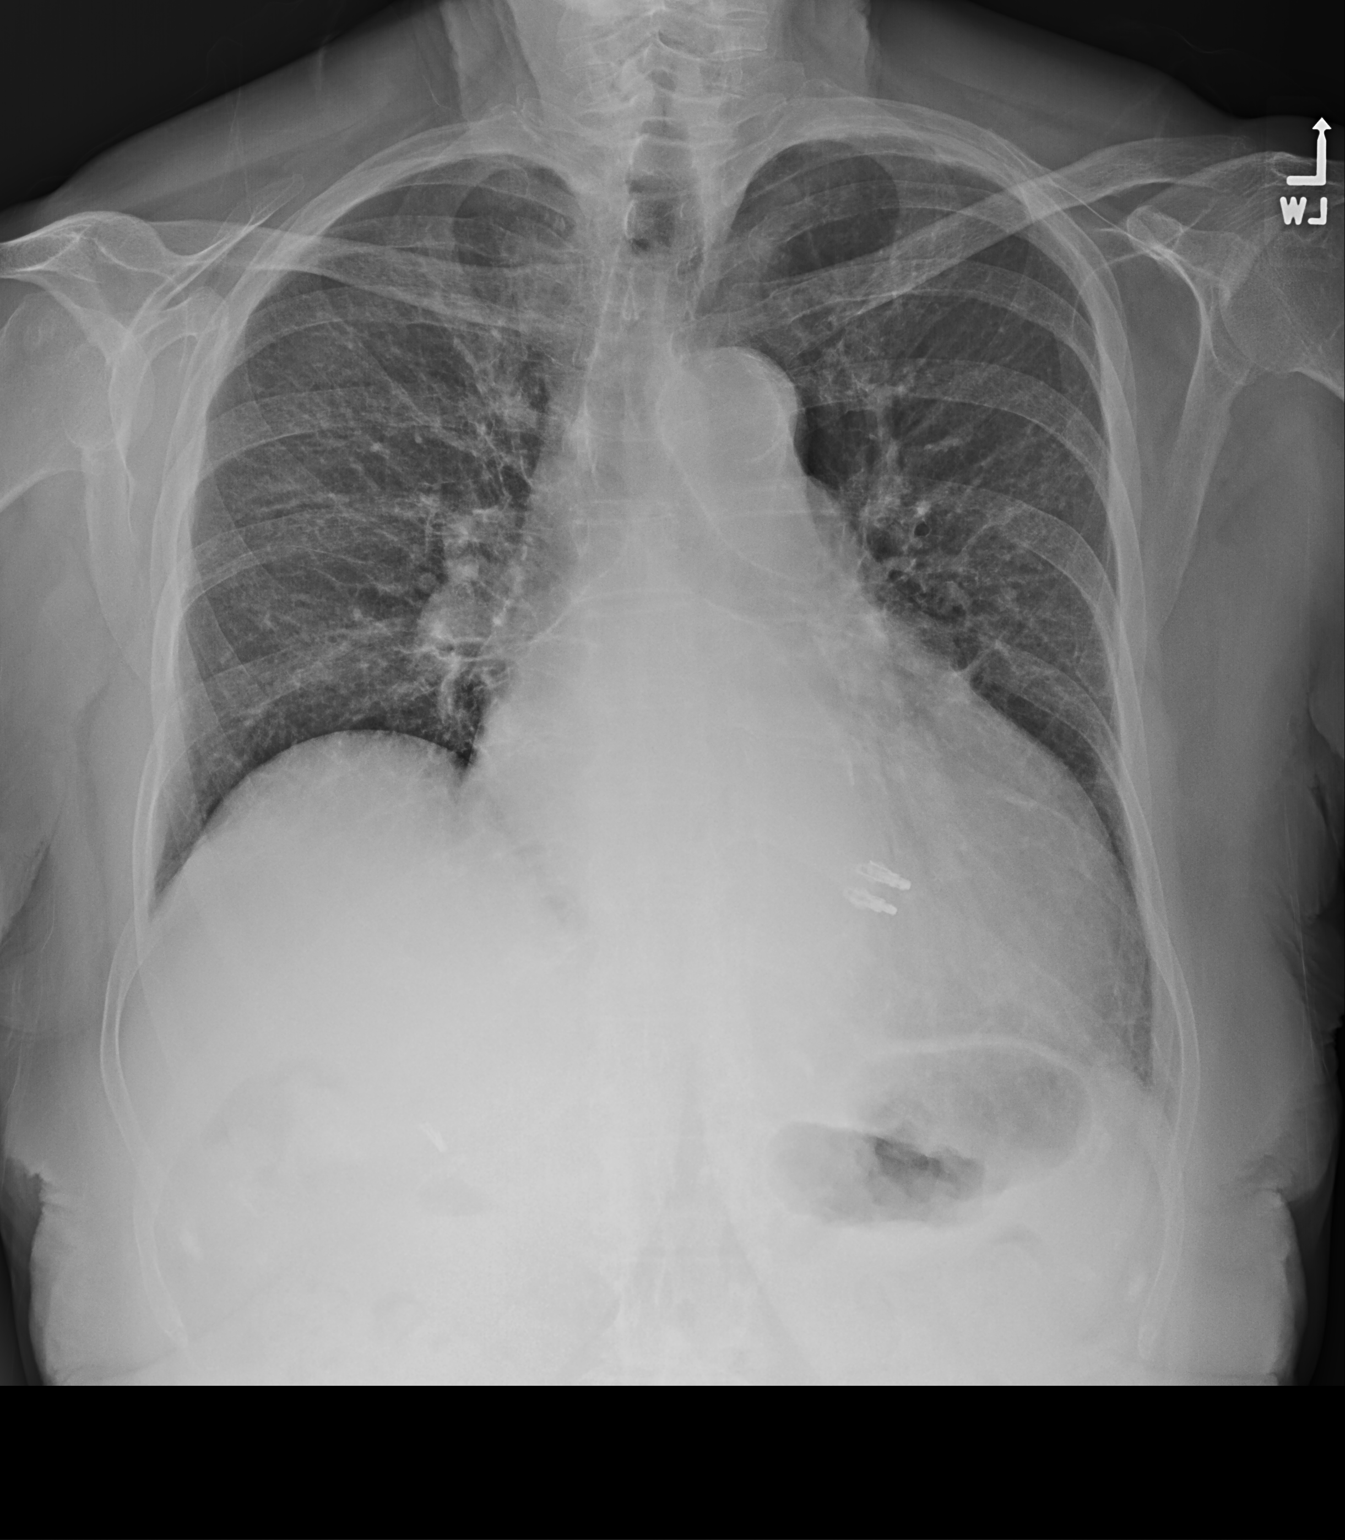

[chest lat]
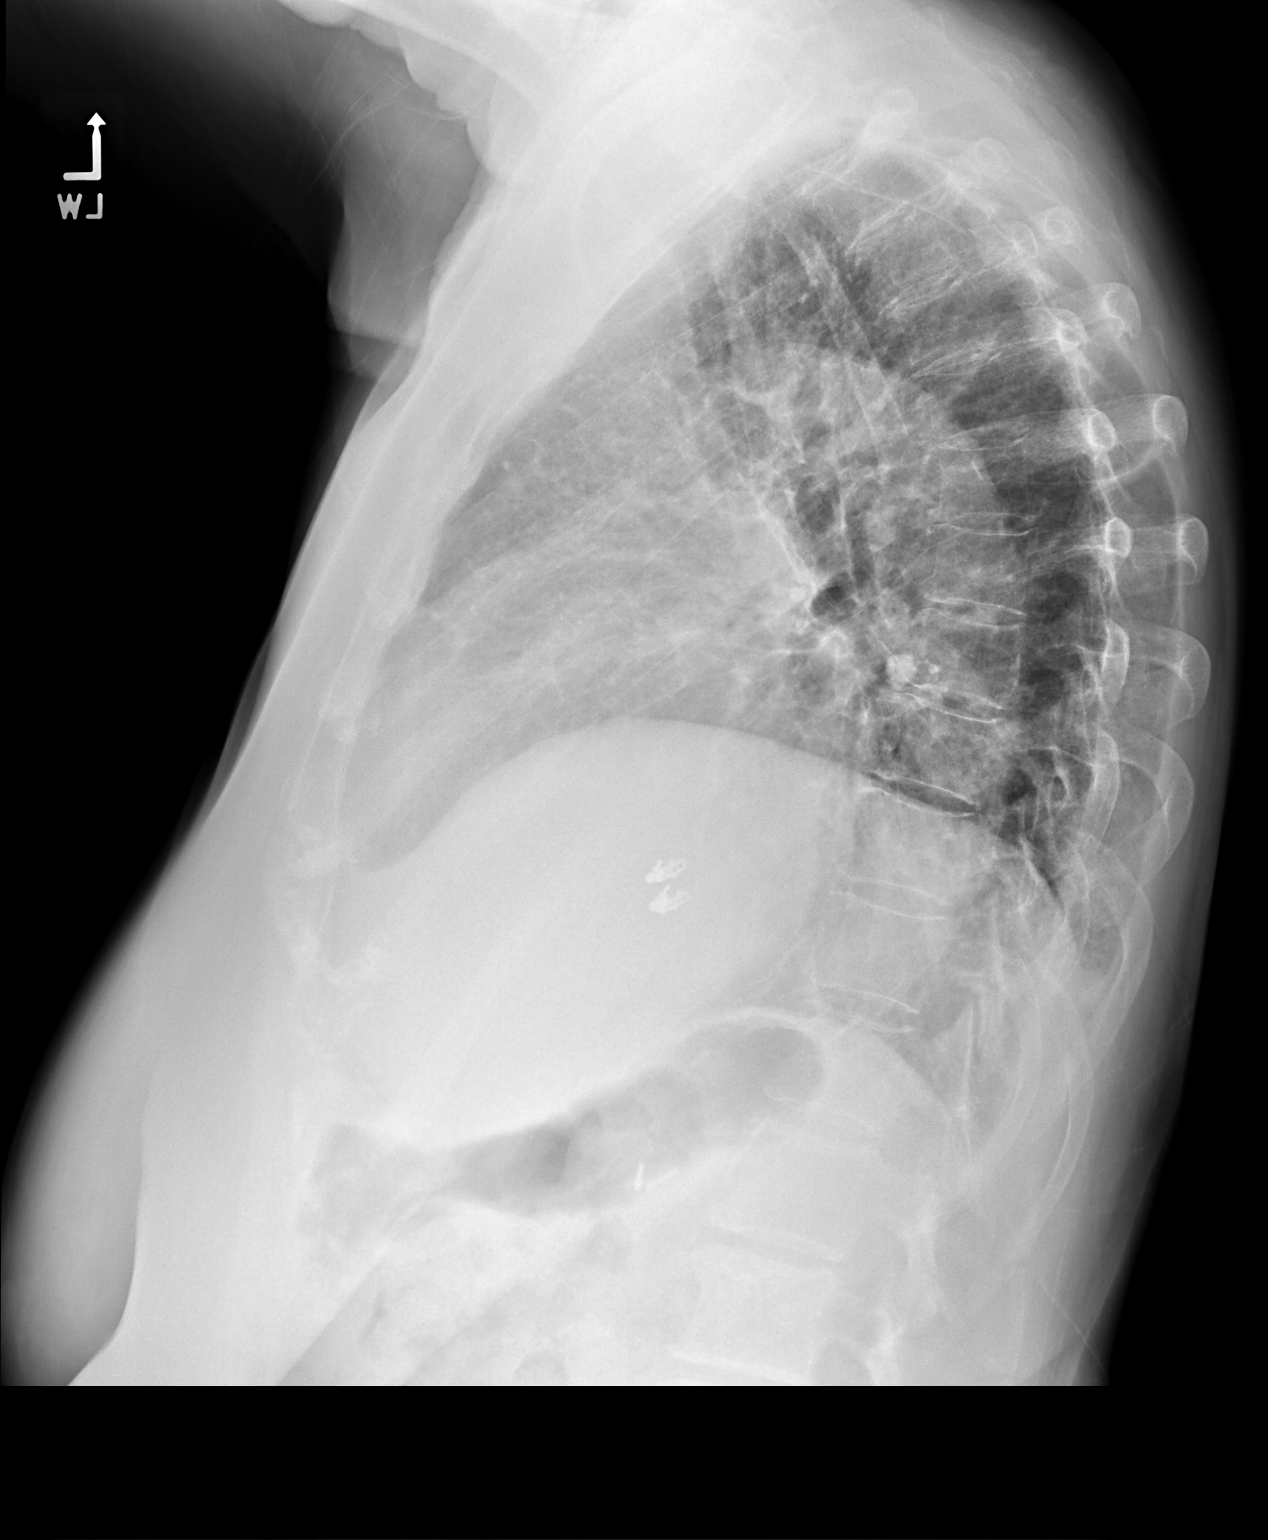

[2 of 2 positions shown; findings below may reference images not displayed]

FINDINGS: Diffuse, chronic appearing increased interstitial lung markings are
seen. There is stable elevation of the right hemidiaphragm and
subsequent right-sided volume loss. There is no evidence of acute
infiltrate, pleural effusion or pneumothorax. The cardiac silhouette
is moderately enlarged and unchanged in size. Radiopaque surgical
clips are seen overlying the expected region of the mitral valve.
Additional radiopaque surgical clips are also seen within the right
upper quadrant. The visualized skeletal structures are unremarkable.
IMPRESSION: Cardiomegaly and chronic appearing increased interstitial lung
markings without acute or active cardiopulmonary disease.
